# Patient Record
Sex: Female | Born: 1950 | Race: White | Hispanic: No | Marital: Single
Health system: Southern US, Community
[De-identification: ages and names within clinical notes are randomized; demographics above are authoritative.]

## PROBLEM LIST (undated history)

## (undated) DIAGNOSIS — D45 Polycythemia vera: Secondary | ICD-10-CM

## (undated) DIAGNOSIS — F32A Depression, unspecified: Secondary | ICD-10-CM

## (undated) DIAGNOSIS — I10 Essential (primary) hypertension: Secondary | ICD-10-CM

## (undated) DIAGNOSIS — F329 Major depressive disorder, single episode, unspecified: Secondary | ICD-10-CM

## (undated) DIAGNOSIS — K5190 Ulcerative colitis, unspecified, without complications: Secondary | ICD-10-CM

## (undated) DIAGNOSIS — Z91148 Patient's other noncompliance with medication regimen for other reason: Secondary | ICD-10-CM

## (undated) DIAGNOSIS — I2510 Atherosclerotic heart disease of native coronary artery without angina pectoris: Secondary | ICD-10-CM

## (undated) DIAGNOSIS — J449 Chronic obstructive pulmonary disease, unspecified: Secondary | ICD-10-CM

## (undated) DIAGNOSIS — Z9889 Other specified postprocedural states: Secondary | ICD-10-CM

## (undated) DIAGNOSIS — M8580 Other specified disorders of bone density and structure, unspecified site: Secondary | ICD-10-CM

## (undated) DIAGNOSIS — B1920 Unspecified viral hepatitis C without hepatic coma: Secondary | ICD-10-CM

## (undated) DIAGNOSIS — E785 Hyperlipidemia, unspecified: Secondary | ICD-10-CM

## (undated) DIAGNOSIS — K219 Gastro-esophageal reflux disease without esophagitis: Secondary | ICD-10-CM

## (undated) DIAGNOSIS — Z9114 Patient's other noncompliance with medication regimen: Secondary | ICD-10-CM

## (undated) DIAGNOSIS — M1990 Unspecified osteoarthritis, unspecified site: Secondary | ICD-10-CM

## (undated) DIAGNOSIS — F419 Anxiety disorder, unspecified: Secondary | ICD-10-CM

## (undated) DIAGNOSIS — I779 Disorder of arteries and arterioles, unspecified: Secondary | ICD-10-CM

## (undated) DIAGNOSIS — I509 Heart failure, unspecified: Secondary | ICD-10-CM

## (undated) DIAGNOSIS — H269 Unspecified cataract: Secondary | ICD-10-CM

## (undated) DIAGNOSIS — G459 Transient cerebral ischemic attack, unspecified: Secondary | ICD-10-CM

## (undated) DIAGNOSIS — I739 Peripheral vascular disease, unspecified: Secondary | ICD-10-CM

## (undated) DIAGNOSIS — R0600 Dyspnea, unspecified: Secondary | ICD-10-CM

## (undated) DIAGNOSIS — D751 Secondary polycythemia: Secondary | ICD-10-CM

## (undated) DIAGNOSIS — R569 Unspecified convulsions: Secondary | ICD-10-CM

## (undated) DIAGNOSIS — R7303 Prediabetes: Secondary | ICD-10-CM

## (undated) HISTORY — DX: Patient's other noncompliance with medication regimen for other reason: Z91.148

## (undated) HISTORY — DX: Unspecified osteoarthritis, unspecified site: M19.90

## (undated) HISTORY — PX: ABDOMINAL SURGERY: SHX537

## (undated) HISTORY — DX: Transient cerebral ischemic attack, unspecified: G45.9

## (undated) HISTORY — DX: Essential (primary) hypertension: I10

## (undated) HISTORY — DX: Ulcerative colitis, unspecified, without complications: K51.90

## (undated) HISTORY — DX: Patient's other noncompliance with medication regimen: Z91.14

## (undated) HISTORY — DX: Hyperlipidemia, unspecified: E78.5

## (undated) HISTORY — DX: Unspecified viral hepatitis C without hepatic coma: B19.20

## (undated) HISTORY — DX: Gastro-esophageal reflux disease without esophagitis: K21.9

## (undated) HISTORY — DX: Disorder of arteries and arterioles, unspecified: I77.9

## (undated) HISTORY — DX: Atherosclerotic heart disease of native coronary artery without angina pectoris: I25.10

## (undated) HISTORY — DX: Secondary polycythemia: D75.1

## (undated) HISTORY — PX: TUBAL LIGATION: SHX77

## (undated) HISTORY — PX: LYMPH NODE DISSECTION: SHX5087

## (undated) HISTORY — DX: Unspecified cataract: H26.9

## (undated) HISTORY — DX: Other specified disorders of bone density and structure, unspecified site: M85.80

---
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
## 1998-10-15 ENCOUNTER — Emergency Department (HOSPITAL_COMMUNITY): Admission: EM | Admit: 1998-10-15 | Discharge: 1998-10-15 | Payer: Self-pay | Admitting: Emergency Medicine

## 1998-10-16 ENCOUNTER — Encounter: Payer: Self-pay | Admitting: Emergency Medicine

## 1999-09-07 ENCOUNTER — Encounter: Payer: Self-pay | Admitting: Gastroenterology

## 1999-09-07 ENCOUNTER — Encounter: Admission: RE | Admit: 1999-09-07 | Discharge: 1999-09-07 | Payer: Self-pay | Admitting: Gastroenterology

## 1999-09-12 ENCOUNTER — Ambulatory Visit (HOSPITAL_COMMUNITY): Admission: RE | Admit: 1999-09-12 | Discharge: 1999-09-12 | Payer: Self-pay | Admitting: Gastroenterology

## 1999-09-12 ENCOUNTER — Encounter (INDEPENDENT_AMBULATORY_CARE_PROVIDER_SITE_OTHER): Payer: Self-pay

## 2000-04-26 ENCOUNTER — Encounter: Admission: RE | Admit: 2000-04-26 | Discharge: 2000-04-26 | Payer: Self-pay | Admitting: Obstetrics and Gynecology

## 2000-04-26 ENCOUNTER — Encounter: Payer: Self-pay | Admitting: Obstetrics and Gynecology

## 2000-05-02 ENCOUNTER — Other Ambulatory Visit: Admission: RE | Admit: 2000-05-02 | Discharge: 2000-05-02 | Payer: Self-pay | Admitting: Obstetrics and Gynecology

## 2000-11-01 ENCOUNTER — Encounter (INDEPENDENT_AMBULATORY_CARE_PROVIDER_SITE_OTHER): Payer: Self-pay | Admitting: *Deleted

## 2000-11-01 ENCOUNTER — Ambulatory Visit (HOSPITAL_COMMUNITY): Admission: RE | Admit: 2000-11-01 | Discharge: 2000-11-01 | Payer: Self-pay | Admitting: Obstetrics and Gynecology

## 2001-05-21 ENCOUNTER — Encounter: Payer: Self-pay | Admitting: Obstetrics and Gynecology

## 2001-05-21 ENCOUNTER — Encounter: Admission: RE | Admit: 2001-05-21 | Discharge: 2001-05-21 | Payer: Self-pay | Admitting: Obstetrics and Gynecology

## 2001-06-18 ENCOUNTER — Other Ambulatory Visit: Admission: RE | Admit: 2001-06-18 | Discharge: 2001-06-18 | Payer: Self-pay | Admitting: Obstetrics and Gynecology

## 2002-08-20 ENCOUNTER — Other Ambulatory Visit: Admission: RE | Admit: 2002-08-20 | Discharge: 2002-08-20 | Payer: Self-pay | Admitting: Obstetrics and Gynecology

## 2002-12-11 ENCOUNTER — Encounter: Payer: Self-pay | Admitting: Obstetrics and Gynecology

## 2002-12-11 ENCOUNTER — Encounter: Admission: RE | Admit: 2002-12-11 | Discharge: 2002-12-11 | Payer: Self-pay | Admitting: Obstetrics and Gynecology

## 2003-11-09 ENCOUNTER — Emergency Department (HOSPITAL_COMMUNITY): Admission: EM | Admit: 2003-11-09 | Discharge: 2003-11-09 | Payer: Self-pay | Admitting: Emergency Medicine

## 2004-03-09 ENCOUNTER — Ambulatory Visit: Payer: Self-pay | Admitting: Family Medicine

## 2004-03-17 ENCOUNTER — Ambulatory Visit (HOSPITAL_COMMUNITY): Admission: RE | Admit: 2004-03-17 | Discharge: 2004-03-17 | Payer: Self-pay | Admitting: Obstetrics and Gynecology

## 2004-04-08 ENCOUNTER — Ambulatory Visit: Payer: Self-pay | Admitting: Family Medicine

## 2004-04-12 ENCOUNTER — Encounter (INDEPENDENT_AMBULATORY_CARE_PROVIDER_SITE_OTHER): Payer: Self-pay | Admitting: Specialist

## 2004-04-12 ENCOUNTER — Other Ambulatory Visit: Admission: RE | Admit: 2004-04-12 | Discharge: 2004-04-12 | Payer: Self-pay

## 2004-04-12 ENCOUNTER — Encounter: Admission: RE | Admit: 2004-04-12 | Discharge: 2004-04-12 | Payer: Self-pay | Admitting: Obstetrics and Gynecology

## 2004-04-14 ENCOUNTER — Ambulatory Visit: Payer: Self-pay | Admitting: Family Medicine

## 2005-02-20 HISTORY — PX: OTHER SURGICAL HISTORY: SHX169

## 2005-02-20 HISTORY — PX: CAROTID ENDARTERECTOMY: SUR193

## 2005-08-20 ENCOUNTER — Encounter: Payer: Self-pay | Admitting: Family Medicine

## 2005-08-30 ENCOUNTER — Ambulatory Visit: Payer: Self-pay | Admitting: Family Medicine

## 2005-09-06 ENCOUNTER — Ambulatory Visit: Payer: Self-pay | Admitting: Family Medicine

## 2005-10-04 ENCOUNTER — Ambulatory Visit: Payer: Self-pay | Admitting: Family Medicine

## 2005-10-05 ENCOUNTER — Inpatient Hospital Stay (HOSPITAL_COMMUNITY): Admission: AD | Admit: 2005-10-05 | Discharge: 2005-10-09 | Payer: Self-pay | Admitting: *Deleted

## 2005-10-06 ENCOUNTER — Encounter (INDEPENDENT_AMBULATORY_CARE_PROVIDER_SITE_OTHER): Payer: Self-pay | Admitting: *Deleted

## 2005-12-07 ENCOUNTER — Ambulatory Visit: Payer: Self-pay | Admitting: Family Medicine

## 2006-01-24 ENCOUNTER — Ambulatory Visit: Payer: Self-pay | Admitting: Internal Medicine

## 2006-04-19 ENCOUNTER — Ambulatory Visit: Payer: Self-pay | Admitting: *Deleted

## 2006-06-03 ENCOUNTER — Encounter: Admission: RE | Admit: 2006-06-03 | Discharge: 2006-06-03 | Payer: Self-pay | Admitting: Cardiology

## 2006-06-07 ENCOUNTER — Ambulatory Visit: Payer: Self-pay | Admitting: Gastroenterology

## 2006-10-15 ENCOUNTER — Encounter: Admission: RE | Admit: 2006-10-15 | Discharge: 2006-10-15 | Payer: Self-pay | Admitting: Obstetrics and Gynecology

## 2006-10-23 ENCOUNTER — Encounter: Payer: Self-pay | Admitting: Family Medicine

## 2006-10-23 DIAGNOSIS — I10 Essential (primary) hypertension: Secondary | ICD-10-CM | POA: Insufficient documentation

## 2006-10-23 DIAGNOSIS — B1710 Acute hepatitis C without hepatic coma: Secondary | ICD-10-CM | POA: Insufficient documentation

## 2006-11-14 ENCOUNTER — Ambulatory Visit: Payer: Self-pay | Admitting: Family Medicine

## 2006-11-14 LAB — CONVERTED CEMR LAB
Ketones, urine, test strip: NEGATIVE
Nitrite: NEGATIVE
Protein, U semiquant: NEGATIVE
Specific Gravity, Urine: 1.025
Urobilinogen, UA: 0.2
pH: 5.5

## 2006-11-21 ENCOUNTER — Ambulatory Visit: Payer: Self-pay | Admitting: Family Medicine

## 2006-11-21 DIAGNOSIS — M159 Polyosteoarthritis, unspecified: Secondary | ICD-10-CM | POA: Insufficient documentation

## 2006-11-21 LAB — CONVERTED CEMR LAB
AST: 62 units/L — ABNORMAL HIGH (ref 0–37)
Albumin: 3.8 g/dL (ref 3.5–5.2)
Alkaline Phosphatase: 121 units/L — ABNORMAL HIGH (ref 39–117)
BUN: 11 mg/dL (ref 6–23)
CO2: 33 meq/L — ABNORMAL HIGH (ref 19–32)
Calcium: 9.9 mg/dL (ref 8.4–10.5)
Chloride: 108 meq/L (ref 96–112)
Cholesterol: 161 mg/dL (ref 0–200)
Creatinine, Ser: 0.9 mg/dL (ref 0.4–1.2)
GFR calc Af Amer: 83 mL/min
GFR calc non Af Amer: 69 mL/min
HCT: 50 % — ABNORMAL HIGH (ref 36.0–46.0)
HDL: 38.3 mg/dL — ABNORMAL LOW (ref >39.0)
MCHC: 34.2 g/dL (ref 30.0–36.0)
Monocytes Relative: 10.6 % (ref 3.0–11.0)
Neutrophils Relative %: 49 % (ref 43.0–77.0)
Platelets: 219 10*3/uL (ref 150–400)
Potassium: 4.1 meq/L (ref 3.5–5.1)
RBC: 5.29 M/uL — ABNORMAL HIGH (ref 3.87–5.11)
RDW: 11.8 % (ref 11.5–14.6)
Total CHOL/HDL Ratio: 4.2
Triglycerides: 131 mg/dL (ref 0–149)
WBC: 7.2 10*3/uL (ref 4.5–10.5)

## 2007-01-24 ENCOUNTER — Encounter: Payer: Self-pay | Admitting: Family Medicine

## 2007-01-24 DIAGNOSIS — F341 Dysthymic disorder: Secondary | ICD-10-CM | POA: Insufficient documentation

## 2007-02-11 ENCOUNTER — Ambulatory Visit: Payer: Self-pay | Admitting: Vascular Surgery

## 2007-02-12 ENCOUNTER — Telehealth: Payer: Self-pay | Admitting: Family Medicine

## 2007-02-21 DIAGNOSIS — D751 Secondary polycythemia: Secondary | ICD-10-CM

## 2007-02-21 HISTORY — DX: Secondary polycythemia: D75.1

## 2007-04-17 ENCOUNTER — Telehealth: Payer: Self-pay | Admitting: Family Medicine

## 2007-06-13 ENCOUNTER — Ambulatory Visit: Payer: Self-pay | Admitting: Family Medicine

## 2007-06-13 DIAGNOSIS — K2921 Alcoholic gastritis with bleeding: Secondary | ICD-10-CM | POA: Insufficient documentation

## 2007-06-13 DIAGNOSIS — K2901 Acute gastritis with bleeding: Secondary | ICD-10-CM | POA: Insufficient documentation

## 2007-06-14 LAB — CONVERTED CEMR LAB
Albumin: 4 g/dL (ref 3.5–5.2)
Basophils Relative: 0.1 % (ref 0.0–1.0)
Bilirubin, Direct: 0.3 mg/dL (ref 0.0–0.3)
Eosinophils Absolute: 0.1 10*3/uL (ref 0.0–0.7)
Eosinophils Relative: 1.1 % (ref 0.0–5.0)
MCV: 93.9 fL (ref 78.0–100.0)
Monocytes Absolute: 0.6 10*3/uL (ref 0.1–1.0)
Monocytes Relative: 8.3 % (ref 3.0–12.0)
Neutro Abs: 3.9 10*3/uL (ref 1.4–7.7)
Neutrophils Relative %: 54.7 % (ref 43.0–77.0)
Platelets: 190 10*3/uL (ref 150–400)
WBC: 7.2 10*3/uL (ref 4.5–10.5)

## 2007-06-20 ENCOUNTER — Telehealth: Payer: Self-pay | Admitting: Family Medicine

## 2007-06-21 ENCOUNTER — Encounter: Payer: Self-pay | Admitting: Family Medicine

## 2007-11-05 ENCOUNTER — Ambulatory Visit: Payer: Self-pay | Admitting: Gastroenterology

## 2007-11-20 ENCOUNTER — Telehealth: Payer: Self-pay | Admitting: Family Medicine

## 2007-11-21 ENCOUNTER — Ambulatory Visit: Payer: Self-pay | Admitting: Hematology & Oncology

## 2007-11-29 LAB — CBC WITH DIFFERENTIAL (CANCER CENTER ONLY)
BASO%: 3.4 % — ABNORMAL HIGH (ref 0.0–2.0)
EOS%: 1.5 % (ref 0.0–7.0)
LYMPH%: 30.8 % (ref 14.0–48.0)
MCH: 30.9 pg (ref 26.0–34.0)
MCHC: 33.9 g/dL (ref 32.0–36.0)
MCV: 91 fL (ref 81–101)
MONO#: 0.6 10*3/uL (ref 0.1–0.9)
MONO%: 7.4 % (ref 0.0–13.0)
NEUT%: 56.9 % (ref 39.6–80.0)
Platelets: 223 10*3/uL (ref 145–400)
RDW: 11.4 % (ref 10.5–14.6)
WBC: 8 10*3/uL (ref 3.9–10.0)

## 2007-11-29 LAB — CHCC SATELLITE - SMEAR

## 2007-12-02 LAB — FERRITIN: Ferritin: 123 ng/mL (ref 10–291)

## 2007-12-02 LAB — AFP TUMOR MARKER: AFP-Tumor Marker: 20.8 ng/mL — ABNORMAL HIGH (ref 0.0–8.0)

## 2007-12-02 LAB — VITAMIN B12: Vitamin B-12: 933 pg/mL — ABNORMAL HIGH (ref 211–911)

## 2007-12-02 LAB — RETICULOCYTES (CHCC): ABS Retic: 40.7 10*3/uL (ref 19.0–186.0)

## 2007-12-02 LAB — LACTATE DEHYDROGENASE: LDH: 195 U/L (ref 94–250)

## 2007-12-02 LAB — ERYTHROPOIETIN: Erythropoietin: 5.3 m[IU]/mL (ref 2.6–34.0)

## 2007-12-03 ENCOUNTER — Ambulatory Visit (HOSPITAL_COMMUNITY): Admission: RE | Admit: 2007-12-03 | Discharge: 2007-12-03 | Payer: Self-pay | Admitting: Hematology & Oncology

## 2007-12-10 LAB — CBC WITH DIFFERENTIAL (CANCER CENTER ONLY)
BASO%: 1.2 % (ref 0.0–2.0)
EOS%: 1.7 % (ref 0.0–7.0)
LYMPH#: 2.3 10*3/uL (ref 0.9–3.3)
MCHC: 34 g/dL (ref 32.0–36.0)
MONO#: 0.6 10*3/uL (ref 0.1–0.9)
NEUT#: 4.9 10*3/uL (ref 1.5–6.5)
Platelets: 195 10*3/uL (ref 145–400)
RDW: 11.1 % (ref 10.5–14.6)
WBC: 8 10*3/uL (ref 3.9–10.0)

## 2007-12-10 LAB — CHCC SATELLITE - SMEAR

## 2007-12-19 ENCOUNTER — Ambulatory Visit: Payer: Self-pay | Admitting: Hematology & Oncology

## 2007-12-27 LAB — CBC WITH DIFFERENTIAL (CANCER CENTER ONLY)
BASO#: 0.1 10*3/uL (ref 0.0–0.2)
Eosinophils Absolute: 0.1 10*3/uL (ref 0.0–0.5)
LYMPH%: 37.8 % (ref 14.0–48.0)
MCH: 31.5 pg (ref 26.0–34.0)
MCHC: 33.7 g/dL (ref 32.0–36.0)
MCV: 94 fL (ref 81–101)
MONO%: 6.2 % (ref 0.0–13.0)
Platelets: 233 10*3/uL (ref 145–400)
RBC: 4.54 10*6/uL (ref 3.70–5.32)

## 2008-01-07 ENCOUNTER — Ambulatory Visit: Payer: Self-pay | Admitting: Hematology & Oncology

## 2008-01-08 ENCOUNTER — Encounter: Payer: Self-pay | Admitting: Family Medicine

## 2008-01-08 LAB — CBC WITH DIFFERENTIAL (CANCER CENTER ONLY)
BASO%: 1.2 % (ref 0.0–2.0)
LYMPH%: 32.8 % (ref 14.0–48.0)
MCH: 31.9 pg (ref 26.0–34.0)
MCV: 94 fL (ref 81–101)
MONO%: 6.7 % (ref 0.0–13.0)
Platelets: 236 10*3/uL (ref 145–400)
RDW: 10.5 % (ref 10.5–14.6)
WBC: 8.2 10*3/uL (ref 3.9–10.0)

## 2008-01-23 ENCOUNTER — Encounter: Payer: Self-pay | Admitting: Family Medicine

## 2008-01-23 LAB — CBC WITH DIFFERENTIAL (CANCER CENTER ONLY)
BASO#: 0 10*3/uL (ref 0.0–0.2)
EOS%: 1.5 % (ref 0.0–7.0)
Eosinophils Absolute: 0.1 10*3/uL (ref 0.0–0.5)
HCT: 43.8 % (ref 34.8–46.6)
HGB: 14.9 g/dL (ref 11.6–15.9)
LYMPH#: 2.2 10*3/uL (ref 0.9–3.3)
MONO#: 0.5 10*3/uL (ref 0.1–0.9)
NEUT#: 3.5 10*3/uL (ref 1.5–6.5)
NEUT%: 55.5 % (ref 39.6–80.0)
RBC: 4.62 10*6/uL (ref 3.70–5.32)

## 2008-01-24 ENCOUNTER — Encounter: Admission: RE | Admit: 2008-01-24 | Discharge: 2008-01-24 | Payer: Self-pay | Admitting: Obstetrics and Gynecology

## 2008-02-21 HISTORY — PX: CORONARY ANGIOPLASTY WITH STENT PLACEMENT: SHX49

## 2008-02-26 ENCOUNTER — Ambulatory Visit: Payer: Self-pay | Admitting: Hematology & Oncology

## 2008-02-27 ENCOUNTER — Encounter: Payer: Self-pay | Admitting: Family Medicine

## 2008-02-27 LAB — CBC WITH DIFFERENTIAL (CANCER CENTER ONLY)
BASO%: 2.4 % — ABNORMAL HIGH (ref 0.0–2.0)
EOS%: 1.3 % (ref 0.0–7.0)
HGB: 16.5 g/dL — ABNORMAL HIGH (ref 11.6–15.9)
LYMPH#: 2.8 10*3/uL (ref 0.9–3.3)
MCHC: 34.1 g/dL (ref 32.0–36.0)
MONO#: 0.5 10*3/uL (ref 0.1–0.9)
NEUT#: 4.4 10*3/uL (ref 1.5–6.5)
RDW: 10.3 % — ABNORMAL LOW (ref 10.5–14.6)
WBC: 8 10*3/uL (ref 3.9–10.0)

## 2008-03-13 ENCOUNTER — Encounter: Payer: Self-pay | Admitting: Family Medicine

## 2008-03-13 LAB — CBC WITH DIFFERENTIAL (CANCER CENTER ONLY)
BASO#: 0.1 10*3/uL (ref 0.0–0.2)
Eosinophils Absolute: 0.1 10*3/uL (ref 0.0–0.5)
HCT: 48.7 % — ABNORMAL HIGH (ref 34.8–46.6)
HGB: 16.4 g/dL — ABNORMAL HIGH (ref 11.6–15.9)
MCH: 31.5 pg (ref 26.0–34.0)
MCV: 93 fL (ref 81–101)
MONO%: 8.2 % (ref 0.0–13.0)
NEUT#: 3.1 10*3/uL (ref 1.5–6.5)
NEUT%: 50.1 % (ref 39.6–80.0)
RBC: 5.22 10*6/uL (ref 3.70–5.32)

## 2008-03-22 ENCOUNTER — Emergency Department (HOSPITAL_BASED_OUTPATIENT_CLINIC_OR_DEPARTMENT_OTHER): Admission: EM | Admit: 2008-03-22 | Discharge: 2008-03-22 | Payer: Self-pay | Admitting: Emergency Medicine

## 2008-03-22 ENCOUNTER — Ambulatory Visit: Payer: Self-pay | Admitting: Diagnostic Radiology

## 2008-03-26 ENCOUNTER — Ambulatory Visit: Payer: Self-pay | Admitting: Family Medicine

## 2008-03-26 DIAGNOSIS — S139XXA Sprain of joints and ligaments of unspecified parts of neck, initial encounter: Secondary | ICD-10-CM | POA: Insufficient documentation

## 2008-04-10 ENCOUNTER — Encounter: Payer: Self-pay | Admitting: Family Medicine

## 2008-04-10 LAB — CBC WITH DIFFERENTIAL (CANCER CENTER ONLY)
BASO%: 0.5 % (ref 0.0–2.0)
EOS%: 1 % (ref 0.0–7.0)
HCT: 51.2 % — ABNORMAL HIGH (ref 34.8–46.6)
LYMPH#: 2.7 10*3/uL (ref 0.9–3.3)
LYMPH%: 31.1 % (ref 14.0–48.0)
MCHC: 34.1 g/dL (ref 32.0–36.0)
MONO#: 0.7 10*3/uL (ref 0.1–0.9)
NEUT%: 58.8 % (ref 39.6–80.0)
Platelets: 212 10*3/uL (ref 145–400)
RDW: 11.6 % (ref 10.5–14.6)
WBC: 8.7 10*3/uL (ref 3.9–10.0)

## 2008-04-10 LAB — FERRITIN: Ferritin: 42 ng/mL (ref 10–291)

## 2008-04-14 ENCOUNTER — Ambulatory Visit: Payer: Self-pay | Admitting: Hematology & Oncology

## 2008-04-24 ENCOUNTER — Ambulatory Visit: Payer: Self-pay | Admitting: Hematology & Oncology

## 2008-04-27 LAB — CBC WITH DIFFERENTIAL (CANCER CENTER ONLY)
BASO%: 0.8 % (ref 0.0–2.0)
EOS%: 1.7 % (ref 0.0–7.0)
HCT: 50 % — ABNORMAL HIGH (ref 34.8–46.6)
LYMPH#: 2.2 10*3/uL (ref 0.9–3.3)
MCHC: 32.8 g/dL (ref 32.0–36.0)
MONO#: 0.5 10*3/uL (ref 0.1–0.9)
NEUT#: 5.1 10*3/uL (ref 1.5–6.5)
NEUT%: 63.4 % (ref 39.6–80.0)
RDW: 10.4 % — ABNORMAL LOW (ref 10.5–14.6)
WBC: 8.1 10*3/uL (ref 3.9–10.0)

## 2008-05-11 ENCOUNTER — Encounter: Payer: Self-pay | Admitting: Family Medicine

## 2008-05-11 LAB — CBC WITH DIFFERENTIAL (CANCER CENTER ONLY)
BASO#: 0.1 10*3/uL (ref 0.0–0.2)
EOS%: 2.2 % (ref 0.0–7.0)
Eosinophils Absolute: 0.1 10*3/uL (ref 0.0–0.5)
HCT: 43.5 % (ref 34.8–46.6)
HGB: 14.3 g/dL (ref 11.6–15.9)
LYMPH#: 2.3 10*3/uL (ref 0.9–3.3)
MCHC: 32.9 g/dL (ref 32.0–36.0)
MONO#: 0.5 10*3/uL (ref 0.1–0.9)
NEUT#: 3.4 10*3/uL (ref 1.5–6.5)
NEUT%: 54 % (ref 39.6–80.0)
RBC: 4.69 10*6/uL (ref 3.70–5.32)
WBC: 6.3 10*3/uL (ref 3.9–10.0)

## 2008-05-19 ENCOUNTER — Ambulatory Visit: Payer: Self-pay | Admitting: Family Medicine

## 2008-05-19 DIAGNOSIS — J209 Acute bronchitis, unspecified: Secondary | ICD-10-CM | POA: Insufficient documentation

## 2008-05-25 LAB — CBC WITH DIFFERENTIAL (CANCER CENTER ONLY)
BASO%: 0.9 % (ref 0.0–2.0)
LYMPH%: 33.9 % (ref 14.0–48.0)
MCH: 30.1 pg (ref 26.0–34.0)
MCHC: 32.7 g/dL (ref 32.0–36.0)
MCV: 92 fL (ref 81–101)
MONO%: 7.6 % (ref 0.0–13.0)
NEUT%: 55.8 % (ref 39.6–80.0)
Platelets: 252 10*3/uL (ref 145–400)
RDW: 10.5 % (ref 10.5–14.6)

## 2008-06-04 ENCOUNTER — Ambulatory Visit: Payer: Self-pay | Admitting: Family Medicine

## 2008-06-04 DIAGNOSIS — K219 Gastro-esophageal reflux disease without esophagitis: Secondary | ICD-10-CM | POA: Insufficient documentation

## 2008-06-04 DIAGNOSIS — K5289 Other specified noninfective gastroenteritis and colitis: Secondary | ICD-10-CM | POA: Insufficient documentation

## 2008-06-08 LAB — CBC WITH DIFFERENTIAL (CANCER CENTER ONLY)
BASO%: 0.8 % (ref 0.0–2.0)
EOS%: 1.9 % (ref 0.0–7.0)
LYMPH#: 1.5 10*3/uL (ref 0.9–3.3)
LYMPH%: 31.3 % (ref 14.0–48.0)
MCHC: 32.7 g/dL (ref 32.0–36.0)
MONO#: 0.4 10*3/uL (ref 0.1–0.9)
Platelets: 177 10*3/uL (ref 145–400)
RDW: 11 % (ref 10.5–14.6)
WBC: 4.9 10*3/uL (ref 3.9–10.0)

## 2008-06-09 ENCOUNTER — Ambulatory Visit: Payer: Self-pay | Admitting: Family Medicine

## 2008-06-09 LAB — CONVERTED CEMR LAB
AST: 58 units/L — ABNORMAL HIGH (ref 0–37)
Albumin: 3.4 g/dL — ABNORMAL LOW (ref 3.5–5.2)
Basophils Absolute: 0.1 10*3/uL (ref 0.0–0.1)
Bilirubin Urine: NEGATIVE
Bilirubin, Direct: 0.3 mg/dL (ref 0.0–0.3)
CO2: 28 meq/L (ref 19–32)
Calcium: 9 mg/dL (ref 8.4–10.5)
Cholesterol: 147 mg/dL (ref 0–200)
Creatinine, Ser: 0.7 mg/dL (ref 0.4–1.2)
Eosinophils Relative: 1.8 % (ref 0.0–5.0)
GFR calc non Af Amer: 91.42 mL/min (ref >60)
Glucose, Urine, Semiquant: NEGATIVE
LDL Cholesterol: 72 mg/dL (ref 0–99)
Monocytes Absolute: 0.5 10*3/uL (ref 0.1–1.0)
Neutro Abs: 3 10*3/uL (ref 1.4–7.7)
Neutrophils Relative %: 55.2 % (ref 43.0–77.0)
Nitrite: NEGATIVE
Platelets: 178 10*3/uL (ref 150.0–400.0)
Potassium: 4.1 meq/L (ref 3.5–5.1)
Protein, U semiquant: NEGATIVE
RDW: 11.5 % (ref 11.5–14.6)
Sodium: 147 meq/L — ABNORMAL HIGH (ref 135–145)
TSH: 3.55 microintl units/mL (ref 0.35–5.50)
Total Bilirubin: 0.9 mg/dL (ref 0.3–1.2)
Total Protein: 6.7 g/dL (ref 6.0–8.3)
pH: 7

## 2008-06-16 ENCOUNTER — Ambulatory Visit: Payer: Self-pay | Admitting: Family Medicine

## 2008-06-19 ENCOUNTER — Ambulatory Visit: Payer: Self-pay | Admitting: Hematology & Oncology

## 2008-07-06 LAB — CBC WITH DIFFERENTIAL (CANCER CENTER ONLY)
BASO#: 0.1 10*3/uL (ref 0.0–0.2)
Eosinophils Absolute: 0.1 10*3/uL (ref 0.0–0.5)
HGB: 15.9 g/dL (ref 11.6–15.9)
LYMPH#: 2.6 10*3/uL (ref 0.9–3.3)
MONO#: 0.5 10*3/uL (ref 0.1–0.9)
NEUT#: 3.4 10*3/uL (ref 1.5–6.5)
RBC: 5.39 10*6/uL — ABNORMAL HIGH (ref 3.70–5.32)

## 2008-07-06 LAB — FERRITIN: Ferritin: 16 ng/mL (ref 10–291)

## 2008-07-17 ENCOUNTER — Encounter: Payer: Self-pay | Admitting: Family Medicine

## 2008-07-17 LAB — CBC WITH DIFFERENTIAL (CANCER CENTER ONLY)
BASO#: 0 10*3/uL (ref 0.0–0.2)
Eosinophils Absolute: 0.1 10*3/uL (ref 0.0–0.5)
HCT: 47.4 % — ABNORMAL HIGH (ref 34.8–46.6)
HGB: 15.5 g/dL (ref 11.6–15.9)
LYMPH%: 38.9 % (ref 14.0–48.0)
MCH: 29 pg (ref 26.0–34.0)
MCV: 89 fL (ref 81–101)
MONO%: 7.9 % (ref 0.0–13.0)
NEUT%: 50.8 % (ref 39.6–80.0)
Platelets: 213 10*3/uL (ref 145–400)
RBC: 5.34 10*6/uL — ABNORMAL HIGH (ref 3.70–5.32)

## 2008-08-04 ENCOUNTER — Ambulatory Visit: Payer: Self-pay | Admitting: Hematology & Oncology

## 2008-08-05 LAB — CBC WITH DIFFERENTIAL (CANCER CENTER ONLY)
BASO%: 1.2 % (ref 0.0–2.0)
EOS%: 2.4 % (ref 0.0–7.0)
HGB: 16.3 g/dL — ABNORMAL HIGH (ref 11.6–15.9)
LYMPH#: 2.8 10*3/uL (ref 0.9–3.3)
MCHC: 32.9 g/dL (ref 32.0–36.0)
NEUT#: 3.8 10*3/uL (ref 1.5–6.5)
RDW: 11.8 % (ref 10.5–14.6)
WBC: 7.3 10*3/uL (ref 3.9–10.0)

## 2008-09-09 ENCOUNTER — Ambulatory Visit: Payer: Self-pay | Admitting: Hematology & Oncology

## 2008-09-11 ENCOUNTER — Encounter: Payer: Self-pay | Admitting: Family Medicine

## 2008-09-11 LAB — CBC WITH DIFFERENTIAL (CANCER CENTER ONLY)
BASO%: 1.6 % (ref 0.0–2.0)
EOS%: 1.5 % (ref 0.0–7.0)
HCT: 51.2 % — ABNORMAL HIGH (ref 34.8–46.6)
LYMPH#: 2.8 10*3/uL (ref 0.9–3.3)
MCHC: 33.4 g/dL (ref 32.0–36.0)
NEUT%: 58.8 % (ref 39.6–80.0)
Platelets: 202 10*3/uL (ref 145–400)
RDW: 13.9 % (ref 10.5–14.6)

## 2008-09-11 LAB — FERRITIN: Ferritin: 26 ng/mL (ref 10–291)

## 2008-09-15 ENCOUNTER — Ambulatory Visit: Payer: Self-pay | Admitting: Hematology

## 2008-10-09 ENCOUNTER — Ambulatory Visit: Payer: Self-pay | Admitting: Hematology & Oncology

## 2008-10-09 HISTORY — PX: CORONARY ANGIOPLASTY WITH STENT PLACEMENT: SHX49

## 2008-10-12 ENCOUNTER — Encounter: Payer: Self-pay | Admitting: Family Medicine

## 2008-10-12 LAB — CBC WITH DIFFERENTIAL (CANCER CENTER ONLY)
BASO%: 0.9 % (ref 0.0–2.0)
EOS%: 1.7 % (ref 0.0–7.0)
HCT: 45.6 % (ref 34.8–46.6)
LYMPH%: 36.6 % (ref 14.0–48.0)
MCH: 30.2 pg (ref 26.0–34.0)
MCHC: 33.6 g/dL (ref 32.0–36.0)
MCV: 90 fL (ref 81–101)
MONO#: 0.3 10*3/uL (ref 0.1–0.9)
MONO%: 5.2 % (ref 0.0–13.0)
NEUT%: 55.6 % (ref 39.6–80.0)
Platelets: 216 10*3/uL (ref 145–400)
RDW: 13.3 % (ref 10.5–14.6)

## 2008-10-12 LAB — FERRITIN: Ferritin: 47 ng/mL (ref 10–291)

## 2008-10-15 ENCOUNTER — Inpatient Hospital Stay (HOSPITAL_COMMUNITY): Admission: RE | Admit: 2008-10-15 | Discharge: 2008-10-16 | Payer: Self-pay | Admitting: Cardiology

## 2008-10-15 HISTORY — PX: OTHER SURGICAL HISTORY: SHX169

## 2008-10-16 ENCOUNTER — Encounter: Payer: Self-pay | Admitting: Cardiology

## 2008-10-16 ENCOUNTER — Ambulatory Visit: Payer: Self-pay | Admitting: Vascular Surgery

## 2008-11-03 ENCOUNTER — Telehealth: Payer: Self-pay | Admitting: Family Medicine

## 2008-11-13 ENCOUNTER — Ambulatory Visit: Payer: Self-pay | Admitting: Hematology & Oncology

## 2008-12-02 ENCOUNTER — Encounter: Payer: Self-pay | Admitting: Family Medicine

## 2008-12-02 LAB — CBC WITH DIFFERENTIAL (CANCER CENTER ONLY)
BASO#: 0.1 10*3/uL (ref 0.0–0.2)
Eosinophils Absolute: 0.1 10*3/uL (ref 0.0–0.5)
HCT: 43.7 % (ref 34.8–46.6)
LYMPH%: 38.6 % (ref 14.0–48.0)
MCH: 30.6 pg (ref 26.0–34.0)
MCV: 90 fL (ref 81–101)
MONO#: 0.5 10*3/uL (ref 0.1–0.9)
MONO%: 7.7 % (ref 0.0–13.0)
NEUT%: 50.7 % (ref 39.6–80.0)
RBC: 4.83 10*6/uL (ref 3.70–5.32)
WBC: 6.3 10*3/uL (ref 3.9–10.0)

## 2008-12-02 LAB — COMPREHENSIVE METABOLIC PANEL
Alkaline Phosphatase: 100 U/L (ref 39–117)
BUN: 12 mg/dL (ref 6–23)
CO2: 26 mEq/L (ref 19–32)
Creatinine, Ser: 0.83 mg/dL (ref 0.40–1.20)
Glucose, Bld: 116 mg/dL — ABNORMAL HIGH (ref 70–99)
Total Bilirubin: 0.6 mg/dL (ref 0.3–1.2)
Total Protein: 7.1 g/dL (ref 6.0–8.3)

## 2008-12-02 LAB — FERRITIN: Ferritin: 37 ng/mL (ref 10–291)

## 2008-12-23 ENCOUNTER — Telehealth: Payer: Self-pay | Admitting: Family Medicine

## 2009-01-06 ENCOUNTER — Ambulatory Visit: Payer: Self-pay | Admitting: Family Medicine

## 2009-01-06 DIAGNOSIS — B002 Herpesviral gingivostomatitis and pharyngotonsillitis: Secondary | ICD-10-CM | POA: Insufficient documentation

## 2009-01-06 DIAGNOSIS — D751 Secondary polycythemia: Secondary | ICD-10-CM | POA: Insufficient documentation

## 2009-01-06 DIAGNOSIS — E559 Vitamin D deficiency, unspecified: Secondary | ICD-10-CM | POA: Insufficient documentation

## 2009-01-06 DIAGNOSIS — S339XXA Sprain of unspecified parts of lumbar spine and pelvis, initial encounter: Secondary | ICD-10-CM | POA: Insufficient documentation

## 2009-01-06 DIAGNOSIS — S335XXA Sprain of ligaments of lumbar spine, initial encounter: Secondary | ICD-10-CM

## 2009-01-06 DIAGNOSIS — N760 Acute vaginitis: Secondary | ICD-10-CM | POA: Insufficient documentation

## 2009-01-06 DIAGNOSIS — N390 Urinary tract infection, site not specified: Secondary | ICD-10-CM | POA: Insufficient documentation

## 2009-01-06 LAB — CONVERTED CEMR LAB
Glucose, Urine, Semiquant: NEGATIVE
Nitrite: NEGATIVE

## 2009-01-19 ENCOUNTER — Ambulatory Visit: Payer: Self-pay | Admitting: Family Medicine

## 2009-01-19 ENCOUNTER — Ambulatory Visit: Payer: Self-pay | Admitting: Hematology & Oncology

## 2009-01-19 DIAGNOSIS — M940 Chondrocostal junction syndrome [Tietze]: Secondary | ICD-10-CM | POA: Insufficient documentation

## 2009-01-19 DIAGNOSIS — R079 Chest pain, unspecified: Secondary | ICD-10-CM | POA: Insufficient documentation

## 2009-01-19 DIAGNOSIS — I2510 Atherosclerotic heart disease of native coronary artery without angina pectoris: Secondary | ICD-10-CM

## 2009-01-19 LAB — CONVERTED CEMR LAB: Vit D, 25-Hydroxy: 56 ng/mL (ref 30–89)

## 2009-01-20 ENCOUNTER — Encounter: Payer: Self-pay | Admitting: Family Medicine

## 2009-01-20 LAB — CBC WITH DIFFERENTIAL (CANCER CENTER ONLY)
BASO#: 0.1 10*3/uL (ref 0.0–0.2)
Eosinophils Absolute: 0.1 10*3/uL (ref 0.0–0.5)
HGB: 15.5 g/dL (ref 11.6–15.9)
MCH: 30.6 pg (ref 26.0–34.0)
MCV: 87 fL (ref 81–101)
MONO#: 0.6 10*3/uL (ref 0.1–0.9)
MONO%: 6.5 % (ref 0.0–13.0)
NEUT#: 5.7 10*3/uL (ref 1.5–6.5)
Platelets: 239 10*3/uL (ref 145–400)
RBC: 5.07 10*6/uL (ref 3.70–5.32)
WBC: 9.4 10*3/uL (ref 3.9–10.0)

## 2009-02-26 ENCOUNTER — Ambulatory Visit: Payer: Self-pay | Admitting: Hematology & Oncology

## 2009-03-10 ENCOUNTER — Encounter: Payer: Self-pay | Admitting: Family Medicine

## 2009-03-10 LAB — CBC WITH DIFFERENTIAL (CANCER CENTER ONLY)
BASO#: 0 10*3/uL (ref 0.0–0.2)
HGB: 15.1 g/dL (ref 11.6–15.9)
LYMPH#: 2.2 10*3/uL (ref 0.9–3.3)
LYMPH%: 31.8 % (ref 14.0–48.0)
MONO#: 0.4 10*3/uL (ref 0.1–0.9)
NEUT#: 4.1 10*3/uL (ref 1.5–6.5)
NEUT%: 60.6 % (ref 39.6–80.0)

## 2009-03-17 LAB — COMPREHENSIVE METABOLIC PANEL
Alkaline Phosphatase: 115 U/L (ref 39–117)
BUN: 10 mg/dL (ref 6–23)
CO2: 28 mEq/L (ref 19–32)
Creatinine, Ser: 0.83 mg/dL (ref 0.40–1.20)
Glucose, Bld: 121 mg/dL — ABNORMAL HIGH (ref 70–99)
Sodium: 141 mEq/L (ref 135–145)
Total Bilirubin: 0.8 mg/dL (ref 0.3–1.2)

## 2009-03-17 LAB — FERRITIN: Ferritin: 93 ng/mL (ref 10–291)

## 2009-04-06 ENCOUNTER — Ambulatory Visit: Payer: Self-pay | Admitting: Hematology & Oncology

## 2009-04-12 LAB — CBC WITH DIFFERENTIAL (CANCER CENTER ONLY)
BASO#: 0.1 10*3/uL (ref 0.0–0.2)
Eosinophils Absolute: 0.1 10*3/uL (ref 0.0–0.5)
HGB: 14.9 g/dL (ref 11.6–15.9)
LYMPH%: 34.3 % (ref 14.0–48.0)
MCH: 30.4 pg (ref 26.0–34.0)
MCV: 91 fL (ref 81–101)
MONO#: 0.5 10*3/uL (ref 0.1–0.9)
MONO%: 7.8 % (ref 0.0–13.0)
NEUT#: 3.7 10*3/uL (ref 1.5–6.5)
RBC: 4.91 10*6/uL (ref 3.70–5.32)

## 2009-05-17 ENCOUNTER — Ambulatory Visit: Payer: Self-pay | Admitting: Hematology & Oncology

## 2009-05-19 ENCOUNTER — Encounter: Payer: Self-pay | Admitting: Family Medicine

## 2009-05-19 LAB — CBC WITH DIFFERENTIAL (CANCER CENTER ONLY)
BASO#: 0 10*3/uL (ref 0.0–0.2)
EOS%: 1.4 % (ref 0.0–7.0)
HCT: 44 % (ref 34.8–46.6)
HGB: 14.6 g/dL (ref 11.6–15.9)
LYMPH%: 38 % (ref 14.0–48.0)
MCH: 30.1 pg (ref 26.0–34.0)
MCHC: 33.1 g/dL (ref 32.0–36.0)
MONO%: 4.5 % (ref 0.0–13.0)
NEUT%: 55.5 % (ref 39.6–80.0)

## 2009-05-19 LAB — COMPREHENSIVE METABOLIC PANEL
AST: 42 U/L — ABNORMAL HIGH (ref 0–37)
Alkaline Phosphatase: 109 U/L (ref 39–117)
BUN: 14 mg/dL (ref 6–23)
Creatinine, Ser: 0.79 mg/dL (ref 0.40–1.20)

## 2009-07-06 ENCOUNTER — Ambulatory Visit: Payer: Self-pay | Admitting: Hematology & Oncology

## 2009-07-07 ENCOUNTER — Encounter: Payer: Self-pay | Admitting: Family Medicine

## 2009-07-07 LAB — RETICULOCYTES (CHCC)
ABS Retic: 35.5 10*3/uL (ref 19.0–186.0)
RBC.: 5.07 MIL/uL (ref 3.87–5.11)

## 2009-07-07 LAB — CBC WITH DIFFERENTIAL (CANCER CENTER ONLY)
BASO#: 0.1 10*3/uL (ref 0.0–0.2)
BASO%: 1 % (ref 0.0–2.0)
EOS%: 1.9 % (ref 0.0–7.0)
HGB: 15.2 g/dL (ref 11.6–15.9)
LYMPH#: 2.3 10*3/uL (ref 0.9–3.3)
MCH: 29.6 pg (ref 26.0–34.0)
MCHC: 33.7 g/dL (ref 32.0–36.0)
MONO%: 4.1 % (ref 0.0–13.0)
NEUT#: 4.2 10*3/uL (ref 1.5–6.5)
Platelets: 270 10*3/uL (ref 145–400)
RDW: 11.5 % (ref 10.5–14.6)

## 2009-07-07 LAB — COMPREHENSIVE METABOLIC PANEL
ALT: 33 U/L (ref 0–35)
AST: 45 U/L — ABNORMAL HIGH (ref 0–37)
Calcium: 9.8 mg/dL (ref 8.4–10.5)
Chloride: 102 mEq/L (ref 96–112)
Creatinine, Ser: 0.87 mg/dL (ref 0.40–1.20)

## 2009-07-09 ENCOUNTER — Emergency Department (HOSPITAL_COMMUNITY): Admission: EM | Admit: 2009-07-09 | Discharge: 2009-07-10 | Payer: Self-pay | Admitting: Emergency Medicine

## 2009-07-21 ENCOUNTER — Encounter: Payer: Self-pay | Admitting: Family Medicine

## 2009-07-27 ENCOUNTER — Telehealth: Payer: Self-pay | Admitting: Family Medicine

## 2009-08-31 ENCOUNTER — Ambulatory Visit: Payer: Self-pay | Admitting: Hematology & Oncology

## 2009-09-01 ENCOUNTER — Encounter: Payer: Self-pay | Admitting: Family Medicine

## 2009-09-01 LAB — COMPREHENSIVE METABOLIC PANEL
ALT: 39 U/L — ABNORMAL HIGH (ref 0–35)
AST: 52 U/L — ABNORMAL HIGH (ref 0–37)
Albumin: 4.1 g/dL (ref 3.5–5.2)
CO2: 27 mEq/L (ref 19–32)
Calcium: 9.5 mg/dL (ref 8.4–10.5)
Chloride: 101 mEq/L (ref 96–112)
Creatinine, Ser: 0.88 mg/dL (ref 0.40–1.20)
Potassium: 5.1 mEq/L (ref 3.5–5.3)
Total Protein: 7.2 g/dL (ref 6.0–8.3)

## 2009-09-01 LAB — CBC WITH DIFFERENTIAL (CANCER CENTER ONLY)
BASO#: 0 10*3/uL (ref 0.0–0.2)
EOS%: 2.4 % (ref 0.0–7.0)
Eosinophils Absolute: 0.1 10*3/uL (ref 0.0–0.5)
HCT: 45.6 % (ref 34.8–46.6)
HGB: 15.3 g/dL (ref 11.6–15.9)
LYMPH#: 2.2 10*3/uL (ref 0.9–3.3)
MCH: 29.3 pg (ref 26.0–34.0)
MCHC: 33.6 g/dL (ref 32.0–36.0)
NEUT#: 2.9 10*3/uL (ref 1.5–6.5)
NEUT%: 52 % (ref 39.6–80.0)
RBC: 5.23 10*6/uL (ref 3.70–5.32)

## 2009-09-01 LAB — RETICULOCYTES (CHCC)
ABS Retic: 46.4 10*3/uL (ref 19.0–186.0)
RBC.: 5.16 MIL/uL — ABNORMAL HIGH (ref 3.87–5.11)

## 2009-09-01 LAB — FERRITIN: Ferritin: 86 ng/mL (ref 10–291)

## 2009-10-07 ENCOUNTER — Ambulatory Visit: Payer: Self-pay | Admitting: Family Medicine

## 2009-10-07 DIAGNOSIS — D179 Benign lipomatous neoplasm, unspecified: Secondary | ICD-10-CM | POA: Insufficient documentation

## 2009-10-07 DIAGNOSIS — B029 Zoster without complications: Secondary | ICD-10-CM | POA: Insufficient documentation

## 2009-11-02 ENCOUNTER — Ambulatory Visit: Payer: Self-pay | Admitting: Hematology & Oncology

## 2009-11-03 ENCOUNTER — Encounter: Payer: Self-pay | Admitting: Family Medicine

## 2009-11-10 ENCOUNTER — Ambulatory Visit: Payer: Self-pay | Admitting: Oncology

## 2009-11-27 ENCOUNTER — Emergency Department (HOSPITAL_COMMUNITY): Admission: EM | Admit: 2009-11-27 | Discharge: 2009-11-27 | Payer: Self-pay | Admitting: Emergency Medicine

## 2009-11-27 ENCOUNTER — Ambulatory Visit: Payer: Self-pay | Admitting: Family Medicine

## 2009-11-29 ENCOUNTER — Telehealth: Payer: Self-pay | Admitting: Family Medicine

## 2009-12-06 ENCOUNTER — Encounter: Payer: Self-pay | Admitting: Family Medicine

## 2009-12-24 ENCOUNTER — Ambulatory Visit: Payer: Self-pay | Admitting: Hematology & Oncology

## 2009-12-29 ENCOUNTER — Encounter: Payer: Self-pay | Admitting: Family Medicine

## 2009-12-29 LAB — CBC WITH DIFFERENTIAL (CANCER CENTER ONLY)
Eosinophils Absolute: 0.1 10*3/uL (ref 0.0–0.5)
HCT: 43.9 % (ref 34.8–46.6)
LYMPH%: 32.4 % (ref 14.0–48.0)
MCH: 30.5 pg (ref 26.0–34.0)
MCV: 90 fL (ref 81–101)
MONO%: 5.9 % (ref 0.0–13.0)
NEUT%: 59.6 % (ref 39.6–80.0)
Platelets: 248 10*3/uL (ref 145–400)
RBC: 4.89 10*6/uL (ref 3.70–5.32)

## 2009-12-29 LAB — COMPREHENSIVE METABOLIC PANEL
Alkaline Phosphatase: 98 U/L (ref 39–117)
BUN: 14 mg/dL (ref 6–23)
CO2: 29 mEq/L (ref 19–32)
Creatinine, Ser: 0.88 mg/dL (ref 0.40–1.20)
Glucose, Bld: 120 mg/dL — ABNORMAL HIGH (ref 70–99)
Sodium: 139 mEq/L (ref 135–145)
Total Bilirubin: 0.6 mg/dL (ref 0.3–1.2)

## 2009-12-29 LAB — FERRITIN: Ferritin: 32 ng/mL (ref 10–291)

## 2010-02-26 ENCOUNTER — Ambulatory Visit
Admission: RE | Admit: 2010-02-26 | Discharge: 2010-02-26 | Payer: Self-pay | Source: Home / Self Care | Attending: Family Medicine | Admitting: Family Medicine

## 2010-02-26 DIAGNOSIS — J45901 Unspecified asthma with (acute) exacerbation: Secondary | ICD-10-CM | POA: Insufficient documentation

## 2010-03-08 ENCOUNTER — Ambulatory Visit: Payer: Self-pay | Admitting: Hematology & Oncology

## 2010-03-13 ENCOUNTER — Encounter: Payer: Self-pay | Admitting: Hematology & Oncology

## 2010-03-13 ENCOUNTER — Encounter: Payer: Self-pay | Admitting: Obstetrics and Gynecology

## 2010-03-20 LAB — CONVERTED CEMR LAB
Bilirubin Urine: NEGATIVE
Glucose, Urine, Semiquant: NEGATIVE
Nitrite: NEGATIVE
Urobilinogen, UA: 0.2
WBC Urine, dipstick: NEGATIVE

## 2010-03-21 LAB — CBC WITH DIFFERENTIAL (CANCER CENTER ONLY)
BASO%: 1.2 % (ref 0.0–2.0)
EOS%: 2.4 % (ref 0.0–7.0)
LYMPH%: 41.8 % (ref 14.0–48.0)
MCH: 29.3 pg (ref 26.0–34.0)
MCHC: 33.3 g/dL (ref 32.0–36.0)
MCV: 88 fL (ref 81–101)
MONO%: 8.3 % (ref 0.0–13.0)
NEUT%: 46.3 % (ref 39.6–80.0)
Platelets: 223 10*3/uL (ref 145–400)
RDW: 11.4 % (ref 10.5–14.6)
WBC: 6.1 10*3/uL (ref 3.9–10.0)

## 2010-03-21 LAB — FERRITIN: Ferritin: 43 ng/mL (ref 10–291)

## 2010-03-21 LAB — COMPREHENSIVE METABOLIC PANEL
ALT: 47 U/L — ABNORMAL HIGH (ref 0–35)
Alkaline Phosphatase: 108 U/L (ref 39–117)
Creatinine, Ser: 0.82 mg/dL (ref 0.40–1.20)
Sodium: 136 mEq/L (ref 135–145)
Total Bilirubin: 0.6 mg/dL (ref 0.3–1.2)
Total Protein: 6.5 g/dL (ref 6.0–8.3)

## 2010-03-22 NOTE — Progress Notes (Signed)
Summary: Call A Nurse  Phone Note Other Incoming       [facility]-A-Nurse Triage Call Report Triage Record Num: [id number] Operator: Di Kindle Patient Name: [name] Call Date & Time: 11/27/2009 10:36:09AM Patient Phone: [phone] PCP: Rickard Patience Patient Gender: Female PCP Fax : [phone] Patient DOB: 05/23/1950 Practice Name: [facility] Reason for Call: Emergent call, pt calling, using valtrex as recommended, WU:XLKG are swollen they have been hurting for a couple of days. Guideline: eye irritaiton, caller to office for appt in 4 hrs: [name]. Protocol(s) Used: Eye: Infection or Irritation Recommended Outcome per Protocol: See Provider within 4 hours Reason for Outcome: New onset of severe sensitivity to light or glare (photophobia), eye pain, reduced vision, tearing, or discharge. Care Advice:  ~ Another adult should drive if vision is impaired.  ~ Call provider if symptoms worsen or new symptoms develop. 10/  Needs to go to ER to have this evaluated if she is having severe pain in eye and photophobia      Pt did go to ER on Oct. 8th and the ER report is on MD's desk./ CF

## 2010-03-22 NOTE — Letter (Signed)
Summary: [facility]  [facility]   Imported By: Maryln Gottron 01/07/2010 14:49:16  _____________________________________________________________________  External Attachment:    Type:   Image     Comment:   External Document

## 2010-03-22 NOTE — Letter (Signed)
[facility]   Imported By: Maryln Gottron 04/28/2009 14:00:43  _____________________________________________________________________  External Attachment:    Type:   Image     Comment:   External Document

## 2010-03-22 NOTE — Letter (Signed)
[facility]   Imported By: Maryln Gottron 10/18/2009 15:56:38  _____________________________________________________________________  External Attachment:    Type:   Image     Comment:   External Document

## 2010-03-22 NOTE — Progress Notes (Signed)
Summary: rx alprazolam to [facility]  Phone Note From Pharmacy   Caller: [facility]  Reason for Call: Needs renewal Summary of Call: refill alprazolam  Initial call taken by: Pura Spice, RN,  July 27, 2009 12:38 PM  Follow-up for Phone Call        ok per dr Scotty Court  Follow-up by: Pura Spice, RN,  July 27, 2009 12:39 PM    New/Updated Medications: ALPRAZOLAM 0.5 MG  TABS (ALPRAZOLAM) Take 1 tablet by mouth three times a day Prescriptions: ALPRAZOLAM 0.5 MG  TABS (ALPRAZOLAM) Take 1 tablet by mouth three times a day  #270 x 0   Entered by:   Pura Spice, RN   Authorized by:   Judithann Sheen MD   Signed by:   Pura Spice, RN on 07/27/2009   Method used:   Printed then faxed to ...       [facility]* (mail-order)             ,          Ph: [phone]       Fax: [phone]   RxID:   [id number]

## 2010-03-22 NOTE — Assessment & Plan Note (Signed)
Summary: DIZZYNESS AND EYES SWOLLEN/DLO   Vital Signs:  Patient profile:   60 year old female Height:      61 inches Weight:      148.75 pounds O2 Sat:      98 % on Room air Temp:     97.1 degrees F oral Pulse rate:   65 / minute BP sitting:   136 / 80  (left arm) Cuff size:   regular  Vitals Entered By: Jarome Lamas (November 27, 2009 1:07 PM)  O2 Flow:  Room air CC: dizzy and eyes swollen/pb   History of Present Illness: eyelids started swelling early this week and not getting better   is having some positional dizziness - perhaps vertigo  used saline nasal rinse last night  room spins when she looks left or right  some allergy symptoms - stuffy an drip  had shingles 1 mo ago  prednisone and valtrex -- prednisone gave her insomnia  that cleared up (under her R arm)  a week ago - another outbreak under L breast and went back on the valtrex  took last one last night  that outbreak is gone   eyes itch and hurt also  had some lesions on face and also on tip of nose(gone now) pt is very worried she has shingles of eyes  eyes really itch and hurt    no numbness or weakness    Current Medications (verified): 1)  Alprazolam 0.5 Mg  Tabs (Alprazolam) .... Take 1 Tablet By Mouth Three Times A Day 2)  Metoprolol Succinate 100 Mg Xr24h-Tab (Metoprolol Succinate) .Marland Kitchen.. 1 By Mouth Once Daily 3)  Imodium Advanced 2-125 Mg Chew (Loperamide-Simethicone) .... 2 Onset Diarrhea, 1 After Each Loose Stool 4)  Aspir-Low 81 Mg Tbec (Aspirin) .Marland Kitchen.. 1 Once Daily 5)  Plavix 75 Mg Tabs (Clopidogrel Bisulfate) .... Per Dr Deborah Chalk 6)  Metrogel-Vaginal 0.75 % Gel (Metronidazole) .... Insert 1 Applicator For 7 Days 7)  Magic Mout Wash Hc .Marland Kitchen.. 1 Tsp in Mouth , Rinse Gargle and Swallow Qid 8)  Ciprofloxacin Hcl 500 Mg Tabs (Ciprofloxacin Hcl) .Marland Kitchen.. 1 Two Times A Day For Infection 9)  Lotrisone 1-0.05 % Crea (Clotrimazole-Betamethasone) .... Apply Bid 10)  Valtrex 1 Gm Tabs (Valacyclovir Hcl)  .Marland Kitchen.. 1 Three Times A Day For Shingles 11)  Prednisone 20 Mg Tabs (Prednisone) .Marland Kitchen.. 1 Three Times A Day Pc For 3 Days Then 1 Bidpc For 6 Daysthen 1 Each Day For Shingles and Arthritis  Allergies (verified): No Known Drug Allergies  Physical Exam  Eyes:  bilat upper lid swelling with tenderness  lat corners of conj are erythematous and tender   Skin:  no rash seen on face   faded lesions under R arm Psych:  pt is uncomfortable with eye pain and mildly anxious   Impression & Recommendations:  Problem # 1:  HERPES ZOSTER (ICD-053.9)  recent herpes zoster now with eye pain and headache / irritation  pt is very concerned about zoster of the eye  she was sent to ED for eval by opthymology [facility] called and alerted to her arrival no charge for visit today given direct ref to ER  Orders: No Charge Patient Arrived (NCPA0) (NCPA0)  Complete Medication List: 1)  Alprazolam 0.5 Mg Tabs (Alprazolam) .... Take 1 tablet by mouth three times a day 2)  Metoprolol Succinate 100 Mg Xr24h-tab (Metoprolol succinate) .Marland Kitchen.. 1 by mouth once daily 3)  Imodium Advanced 2-125 Mg Chew (Loperamide-simethicone) .... 2 onset diarrhea, 1  after each loose stool 4)  Aspir-low 81 Mg Tbec (Aspirin) .Marland Kitchen.. 1 once daily 5)  Plavix 75 Mg Tabs (Clopidogrel bisulfate) .... Per dr Deborah Chalk 6)  Metrogel-vaginal 0.75 % Gel (Metronidazole) .... Insert 1 applicator for 7 days 7)  Magic Mout Wash Hc  .Marland Kitchen.. 1 tsp in mouth , rinse gargle and swallow qid 8)  Ciprofloxacin Hcl 500 Mg Tabs (Ciprofloxacin hcl) .Marland Kitchen.. 1 two times a day for infection 9)  Lotrisone 1-0.05 % Crea (Clotrimazole-betamethasone) .... Apply bid 10)  Valtrex 1 Gm Tabs (Valacyclovir hcl) .Marland Kitchen.. 1 three times a day for shingles 11)  Prednisone 20 Mg Tabs (Prednisone) .Marland Kitchen.. 1 three times a day pc for 3 days then 1 bidpc for 6 daysthen 1 each day for shingles and arthritis  Patient Instructions: 1)  please go across the street to [facility] for evaluation 2)  I  cannot completely rule out shingles involving an eye so I feel you need evaluation today

## 2010-03-22 NOTE — Letter (Signed)
[facility]  [facility]   Imported By: Maryln Gottron 03/03/2009 15:49:51  _____________________________________________________________________  External Attachment:    Type:   Image     Comment:   External Document

## 2010-03-22 NOTE — Letter (Signed)
[facility]   Imported By: Maryln Gottron 04/03/2008 14:11:39  _____________________________________________________________________  External Attachment:    Type:   Image     Comment:   External Document

## 2010-03-22 NOTE — Letter (Signed)
Summary: [facility]  [facility]   Imported By: Maryln Gottron 11/18/2009 11:14:50  _____________________________________________________________________  External Attachment:    Type:   Image     Comment:   External Document

## 2010-03-22 NOTE — Letter (Signed)
[facility]   Imported By: Maryln Gottron 07/22/2009 14:12:30  _____________________________________________________________________  External Attachment:    Type:   Image     Comment:   External Document

## 2010-03-22 NOTE — Assessment & Plan Note (Signed)
Summary: lump on lft upper thigh/bumps under right arm/painful/cjr   Vital Signs:  Patient profile:   60 year old female Weight:      147 pounds BMI:     27.88 O2 Sat:      97 % Temp:     98.2 degrees F Pulse rate:   78 / minute Pulse rhythm:   regular BP sitting:   130 / 86  (left arm)  Vitals Entered By: Pura Spice, RN (October 07, 2009 10:48 AM) CC: ? fatty turmor l posterior leg. stated she had dr Myna Hidalgo to look at it and he advised her to see PCP . also bumps under rt axiallae which she states very painful    History of Present Illness: This 60 year old white single female with history of hepatitis C and to attend the clinic had [facility], is also in the care of Dr. Rosemarie Beath for polycythemia she felt a tumor on her left thigh which Dr. 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 Rudean Haskell carotid endarterectomy 2007  Social History: Last updated: 06/16/2008 Current Smoker  Risk Factors: Smoking Status: quit < 6 months (01/06/2009) Packs/Day: 1.0 (06/16/2008)  Review of Systems      See HPI  The patient denies anorexia, fever, weight loss, weight gain, vision loss, decreased hearing, hoarseness, chest pain, syncope, dyspnea on exertion, peripheral edema, prolonged cough, headaches, hemoptysis, abdominal pain, melena, hematochezia, severe indigestion/heartburn, hematuria, incontinence, genital sores, muscle weakness, suspicious skin lesions, transient blindness, difficulty walking, depression, unusual weight change, abnormal bleeding, enlarged lymph nodes, angioedema, breast masses, and  testicular masses.    Physical Exam  General:  Well-developed,well-nourished,in no acute distress; alert,appropriate and cooperative throughout examination Chest Wall:  fascicular rash on her right axilla and noted to Lungs:  Normal respiratory effort, chest expands symmetrically. Lungs are clear to auscultation, no crackles or wheezes. Heart:  Normal rate and regular rhythm. S1 and S2 normal without gallop, murmur, click, rub or other extra sounds. 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 for 7 days Prednisone 10 mg decrease in dosage  Problem # 3:  POLYCYTHEMIA (ICD-289.0) Assessment: Improved  Problem # 4:  HEPATITIS C (ICD-070.51) Assessment: Unchanged  Problem # 5:  HYPERTENSION (ICD-401.9) Assessment: Improved  Her updated medication list for this problem includes:    Metoprolol Succinate 100 Mg Xr24h-tab (Metoprolol succinate) .Marland Kitchen... 1 by mouth once daily  Complete Medication List: 1)  Alprazolam 0.5 Mg Tabs (Alprazolam) .... Take 1 tablet by mouth three times a day 2)  Metoprolol Succinate 100 Mg Xr24h-tab (Metoprolol succinate) .Marland Kitchen.. 1 by mouth once daily 3)  Imodium Advanced 2-125 Mg Chew (Loperamide-simethicone) .... 2 onset diarrhea, 1 after each loose stool 4)  Aspir-low  81 Mg Tbec (Aspirin) .Marland Kitchen.. 1 once daily 5)  Plavix 75 Mg Tabs (Clopidogrel bisulfate) .... Per dr Deborah Chalk 6)  Metrogel-vaginal 0.75 % Gel (Metronidazole) .... Insert 1 applicator for 7 days 7)  Magic Mout Wash Hc  .Marland Kitchen.. 1 tsp in mouth , rinse gargle and swallow qid 8)  Ciprofloxacin Hcl 500 Mg  Tabs (Ciprofloxacin hcl) .Marland Kitchen.. 1 two times a day for infection 9)  Lotrisone 1-0.05 % Crea (Clotrimazole-betamethasone) .... Apply bid 10)  Valtrex 1 Gm Tabs (Valacyclovir hcl) .Marland Kitchen.. 1 three times a day for shingles 11)  Prednisone 20 Mg Tabs (Prednisone) .Marland Kitchen.. 1 three times a day pc for 3 days then 1 bidpc for 6 daysthen 1 each day for shingles and arthritis  Patient Instructions: 1)  the rash in the right axilla and chest wall his herpes zoster 2)  Treatment Valtrex 1 g 3 times daily for 7 days 3)  Also decreasing dosage of prednisone 4)  Recommend pulling to the hepatitis C clinic at [facility] Prescriptions: PREDNISONE 20 MG TABS (PREDNISONE) 1 three times a day pc for 3 days then 1 bidpc for 6 daysthen 1 each day for shingles and arthritis  #30 x 1   Entered and Authorized by:   Judithann Sheen MD   Signed by:   Judithann Sheen MD on 10/07/2009   Method used:   Electronically to        [id number]* (retail)       [address]       [place], [place]  [address]       Ph: [phone] or [phone]       Fax: [phone]   RxID:   [id number] VALTREX 1 GM TABS (VALACYCLOVIR HCL) 1 three times a day for shingles  #21 x 1   Entered and Authorized by:   Judithann Sheen MD   Signed by:   Judithann Sheen MD on 10/07/2009   Method used:   Electronically to        [id number]* (retail)       [address]       [place], [place]  [address]       Ph: [phone] or [phone]       Fax: [phone]   RxID:   [id number] MAGIC MOUT WASH HC 1 tsp in mouth , rinse gargle and swallow qid  #240 cc x 3   Entered and Authorized by:   Judithann Sheen MD   Signed by:   Judithann Sheen MD on 10/07/2009   Method used:   Print then Give to Patient   RxID:   [id number] LOTRISONE 1-0.05 % CREA (CLOTRIMAZOLE-BETAMETHASONE) apply bid  #15 gms x 3   Entered and Authorized by:   Judithann Sheen MD   Signed by:   Judithann Sheen  MD on 10/07/2009   Method used:   Electronically to        [id number] (retail)       [address]       [place], [place]  [address]       Ph: [phone] or [phone]       Fax: [phone]   RxID:   [id number]

## 2010-03-22 NOTE — Letter (Signed)
[facility]   Imported By: Maryln Gottron 10/20/2009 13:01:09  _____________________________________________________________________  External Attachment:    Type:   Image     Comment:   External Document

## 2010-03-22 NOTE — Procedures (Signed)
Summary: Colonoscopy Report/Dr. Sharrell Ku  Colonoscopy Report/Dr. Sharrell Ku   Imported By: Maryln Gottron 12/13/2009 13:56:57  _____________________________________________________________________  External Attachment:    Type:   Image     Comment:   External Document

## 2010-03-22 NOTE — Letter (Signed)
[facility]   Imported By: Maryln Gottron 06/18/2009 15:11:58  _____________________________________________________________________  External Attachment:    Type:   Image     Comment:   External Document

## 2010-03-24 ENCOUNTER — Ambulatory Visit (HOSPITAL_BASED_OUTPATIENT_CLINIC_OR_DEPARTMENT_OTHER): Payer: BC Managed Care – PPO | Admitting: Oncology

## 2010-03-24 DIAGNOSIS — D45 Polycythemia vera: Secondary | ICD-10-CM

## 2010-03-24 DIAGNOSIS — B1920 Unspecified viral hepatitis C without hepatic coma: Secondary | ICD-10-CM

## 2010-03-24 NOTE — Assessment & Plan Note (Signed)
Summary: EAR INF/VJ   Vital Signs:  Patient profile:   60 year old female Height:      61 inches Weight:      152 pounds BMI:     28.82 Temp:     98.0 degrees F oral Pulse rate:   65 / minute BP sitting:   110 / 62  (left arm) Cuff size:   regular  Vitals Entered By: Payton Spark CMA (February 26, 2010 12:07 PM) CC: L ear infection   CC:  L ear infection.  History of Present Illness: [name] is a 60 y/o fem.exsmoker..who comes in fro eval of st,npcough, and l ear ache x 5 days.........ros neg expt. fever/chills at onset.....granddaughter also has a cold  Preventive Screening-Counseling & Management  Alcohol-Tobacco     Smoking Status: quit  Current Medications (verified): 1)  Alprazolam 0.5 Mg  Tabs (Alprazolam) .... Take 1 Tablet By Mouth Three Times A Day 2)  Metoprolol Succinate 100 Mg Xr24h-Tab (Metoprolol Succinate) .Marland Kitchen.. 1 By Mouth Once Daily 3)  Imodium Advanced 2-125 Mg Chew (Loperamide-Simethicone) .... 2 Onset Diarrhea, 1 After Each Loose Stool 4)  Aspir-Low 81 Mg Tbec (Aspirin) .Marland Kitchen.. 1 Once Daily 5)  Plavix 75 Mg Tabs (Clopidogrel Bisulfate) .... Per Dr Deborah Chalk 6)  Valtrex 1 Gm Tabs (Valacyclovir Hcl) .Marland Kitchen.. 1 Three Times A Day For Shingles  Allergies (verified): No Known Drug Allergies  Past History:  Past medical, surgical, family and social histories (including risk factors) reviewed for relevance to current acute and chronic problems.  Past Medical History: Reviewed history from 01/06/2009 and no changes required. Hepatitis C Hypertension Polycythemia 2009  Past Surgical History: Reviewed history from 01/06/2009 and no changes required. 2 stints on Oct 15 2008, Dr. Rudean Haskell carotid endarterectomy 2007  Family History: Reviewed history and no changes required.  Social History: Reviewed history from 06/16/2008 and no changes required.  Former Smoker Smoking Status:  quit  Review of Systems      See HPI   Problems:  Medical Problems Added: 1)   Dx of Viral Infection-unspec  (ICD-079.99) 2)  Dx of Asthma, Acute  (ZOX-096.04)  Impression & Recommendations:  Problem # 1:  VIRAL INFECTION-UNSPEC (ICD-079.99) Assessment New  Her updated medication list for this problem includes:    Aspir-low 81 Mg Tbec (Aspirin) .Marland Kitchen... 1 once daily    Hydromet 5-1.5 Mg/78ml Syrp (Hydrocodone-homatropine) .Marland Kitchen... 1/2 tsp three times a day prn  Problem # 2:  ASTHMA, ACUTE (VWU-981.19) Assessment: New  The following medications were removed from the medication list:    Prednisone 20 Mg Tabs (Prednisone) .Marland Kitchen... 1 three times a day pc for 3 days then 1 bidpc for 6 daysthen 1 each day for shingles and arthritis  Complete Medication List: 1)  Alprazolam 0.5 Mg Tabs (Alprazolam) .... Take 1 tablet by mouth three times a day 2)  Metoprolol Succinate 100 Mg Xr24h-tab (Metoprolol succinate) .Marland Kitchen.. 1 by mouth once daily 3)  Imodium Advanced 2-125 Mg Chew (Loperamide-simethicone) .... 2 onset diarrhea, 1 after each loose stool 4)  Aspir-low 81 Mg Tbec (Aspirin) .Marland Kitchen.. 1 once daily 5)  Plavix 75 Mg Tabs (Clopidogrel bisulfate) .... Per dr Deborah Chalk 6)  Valtrex 1 Gm Tabs (Valacyclovir hcl) .Marland Kitchen.. 1 three times a day for shingles 7)  Hydromet 5-1.5 Mg/14ml Syrp (Hydrocodone-homatropine) .... 1/2 tsp three times a day prn  Patient Instructions: 1)  drink lots of water 2)  vaporizer at bedtime 3)  prednisone 20 mg.......2 x 3days, 1 x 3 days, 1/2  x 3 days.Marland Kitchenthen 1/2 every other day x 2 weeks 4)  hydromet !/2 tsp three times a day prn Prescriptions: HYDROMET 5-1.5 MG/5ML SYRP (HYDROCODONE-HOMATROPINE) 1/2 tsp three times a day prn  #4oz x 1   Entered and Authorized by:   Roderick Pee MD   Signed by:   Roderick Pee MD on 02/26/2010   Method used:   Print then Give to Patient   RxID:   [id number]    Orders Added: 1)  Est. Patient Level IV [56213]

## 2010-03-31 ENCOUNTER — Encounter (HOSPITAL_BASED_OUTPATIENT_CLINIC_OR_DEPARTMENT_OTHER): Payer: BC Managed Care – PPO | Admitting: Oncology

## 2010-03-31 DIAGNOSIS — B1920 Unspecified viral hepatitis C without hepatic coma: Secondary | ICD-10-CM

## 2010-03-31 DIAGNOSIS — D45 Polycythemia vera: Secondary | ICD-10-CM

## 2010-05-09 LAB — COMPREHENSIVE METABOLIC PANEL
BUN: 10 mg/dL (ref 6–23)
CO2: 24 mEq/L (ref 19–32)
Chloride: 107 mEq/L (ref 96–112)
Creatinine, Ser: 0.97 mg/dL (ref 0.4–1.2)
GFR calc non Af Amer: 59 mL/min — ABNORMAL LOW (ref >60)
Total Bilirubin: 0.6 mg/dL (ref 0.3–1.2)

## 2010-05-09 LAB — URINE MICROSCOPIC-ADD ON

## 2010-05-09 LAB — LIPASE, BLOOD: Lipase: 38 U/L (ref 11–59)

## 2010-05-09 LAB — CBC
HCT: 48.1 % — ABNORMAL HIGH (ref 36.0–46.0)
MCV: 89.4 fL (ref 78.0–100.0)
Platelets: 296 10*3/uL (ref 150–400)
RBC: 5.38 MIL/uL — ABNORMAL HIGH (ref 3.87–5.11)
WBC: 15.2 10*3/uL — ABNORMAL HIGH (ref 4.0–10.5)

## 2010-05-09 LAB — GLUCOSE, CAPILLARY: Glucose-Capillary: 103 mg/dL — ABNORMAL HIGH (ref 70–99)

## 2010-05-09 LAB — POCT CARDIAC MARKERS
CKMB, poc: 1.7 ng/mL (ref 1.0–8.0)
Myoglobin, poc: 41.7 ng/mL (ref 12–200)

## 2010-05-09 LAB — URINALYSIS, ROUTINE W REFLEX MICROSCOPIC
Bilirubin Urine: NEGATIVE
Hgb urine dipstick: NEGATIVE
Ketones, ur: NEGATIVE mg/dL
Protein, ur: NEGATIVE mg/dL
Urobilinogen, UA: 0.2 mg/dL (ref 0.0–1.0)

## 2010-05-09 LAB — DIFFERENTIAL
Basophils Absolute: 0.1 10*3/uL (ref 0.0–0.1)
Lymphocytes Relative: 25 % (ref 12–46)
Neutro Abs: 10.4 10*3/uL — ABNORMAL HIGH (ref 1.7–7.7)

## 2010-05-23 ENCOUNTER — Other Ambulatory Visit: Payer: Self-pay | Admitting: Obstetrics and Gynecology

## 2010-05-23 DIAGNOSIS — Z1231 Encounter for screening mammogram for malignant neoplasm of breast: Secondary | ICD-10-CM

## 2010-05-28 LAB — BASIC METABOLIC PANEL
Chloride: 107 mEq/L (ref 96–112)
Creatinine, Ser: 0.75 mg/dL (ref 0.4–1.2)
GFR calc Af Amer: >60 mL/min (ref >60)
Sodium: 138 mEq/L (ref 135–145)

## 2010-05-28 LAB — CBC
Hemoglobin: 12.8 g/dL (ref 12.0–15.0)
MCV: 89.5 fL (ref 78.0–100.0)
RBC: 4.34 MIL/uL (ref 3.87–5.11)
WBC: 6.2 10*3/uL (ref 4.0–10.5)

## 2010-06-09 ENCOUNTER — Ambulatory Visit: Payer: BC Managed Care – PPO

## 2010-06-14 ENCOUNTER — Ambulatory Visit
Admission: RE | Admit: 2010-06-14 | Discharge: 2010-06-14 | Disposition: A | Discharge status: Home / Self Care | Payer: BC Managed Care – PPO | Source: Ambulatory Visit | Attending: Obstetrics and Gynecology | Admitting: Obstetrics and Gynecology

## 2010-06-14 DIAGNOSIS — Z1231 Encounter for screening mammogram for malignant neoplasm of breast: Secondary | ICD-10-CM

## 2010-06-23 ENCOUNTER — Other Ambulatory Visit (INDEPENDENT_AMBULATORY_CARE_PROVIDER_SITE_OTHER): Payer: BC Managed Care – PPO | Admitting: Family Medicine

## 2010-06-23 DIAGNOSIS — Z0000 Encounter for general adult medical examination without abnormal findings: Secondary | ICD-10-CM

## 2010-06-23 LAB — CBC WITH DIFFERENTIAL/PLATELET
Basophils Absolute: 0 10*3/uL (ref 0.0–0.1)
Eosinophils Absolute: 0.1 10*3/uL (ref 0.0–0.7)
HCT: 45.7 % (ref 36.0–46.0)
Hemoglobin: 15.5 g/dL — ABNORMAL HIGH (ref 12.0–15.0)
Lymphs Abs: 2.2 10*3/uL (ref 0.7–4.0)
MCHC: 33.9 g/dL (ref 30.0–36.0)
Neutro Abs: 3.3 10*3/uL (ref 1.4–7.7)
RDW: 13.9 % (ref 11.5–14.6)

## 2010-06-23 LAB — BASIC METABOLIC PANEL
CO2: 27 mEq/L (ref 19–32)
Calcium: 9.3 mg/dL (ref 8.4–10.5)
Glucose, Bld: 102 mg/dL — ABNORMAL HIGH (ref 70–99)
Potassium: 3.8 mEq/L (ref 3.5–5.1)
Sodium: 137 mEq/L (ref 135–145)

## 2010-06-23 LAB — LIPID PANEL
Cholesterol: 155 mg/dL (ref 0–200)
HDL: 48.1 mg/dL (ref >39.00)
VLDL: 20.8 mg/dL (ref 0.0–40.0)

## 2010-06-23 LAB — HEPATIC FUNCTION PANEL: Albumin: 3.6 g/dL (ref 3.5–5.2)

## 2010-06-23 LAB — POCT URINALYSIS DIPSTICK
Ketones, UA: NEGATIVE
Protein, UA: NEGATIVE
Spec Grav, UA: 1.025
pH, UA: 5.5

## 2010-06-23 LAB — TSH: TSH: 5.59 u[IU]/mL — ABNORMAL HIGH (ref 0.35–5.50)

## 2010-06-30 ENCOUNTER — Encounter: Payer: Self-pay | Admitting: Family Medicine

## 2010-06-30 ENCOUNTER — Ambulatory Visit (INDEPENDENT_AMBULATORY_CARE_PROVIDER_SITE_OTHER): Payer: BC Managed Care – PPO | Admitting: Family Medicine

## 2010-06-30 VITALS — BP 100/62 | HR 74 | Temp 98.3°F | Ht 60.6 in | Wt 152.0 lb

## 2010-06-30 DIAGNOSIS — I2510 Atherosclerotic heart disease of native coronary artery without angina pectoris: Secondary | ICD-10-CM

## 2010-06-30 DIAGNOSIS — Z0000 Encounter for general adult medical examination without abnormal findings: Secondary | ICD-10-CM

## 2010-06-30 DIAGNOSIS — F411 Generalized anxiety disorder: Secondary | ICD-10-CM

## 2010-06-30 DIAGNOSIS — D751 Secondary polycythemia: Secondary | ICD-10-CM

## 2010-06-30 DIAGNOSIS — B159 Hepatitis A without hepatic coma: Secondary | ICD-10-CM

## 2010-06-30 DIAGNOSIS — E039 Hypothyroidism, unspecified: Secondary | ICD-10-CM

## 2010-06-30 DIAGNOSIS — F419 Anxiety disorder, unspecified: Secondary | ICD-10-CM

## 2010-06-30 MED ORDER — LEVOTHYROXINE SODIUM 100 MCG PO TABS: 100.0000 ug | ORAL_TABLET | Freq: Every day | ORAL | Status: DC

## 2010-06-30 MED ORDER — RANITIDINE HCL 300 MG PO TABS: 300.0000 mg | ORAL_TABLET | Freq: Every day | ORAL | Status: DC

## 2010-06-30 NOTE — Patient Instructions (Addendum)
You have hypothyroidism and to tx with synthroid , return 6 weeks for TSH Will find out about new Hep C treatment Refilled alprazolam

## 2010-07-01 ENCOUNTER — Encounter (HOSPITAL_BASED_OUTPATIENT_CLINIC_OR_DEPARTMENT_OTHER): Payer: BC Managed Care – PPO | Admitting: Hematology & Oncology

## 2010-07-01 DIAGNOSIS — D45 Polycythemia vera: Secondary | ICD-10-CM

## 2010-07-01 DIAGNOSIS — B1920 Unspecified viral hepatitis C without hepatic coma: Secondary | ICD-10-CM

## 2010-07-05 NOTE — Discharge Summary (Signed)
NAMECAELIE, REMSBURG NO.:  [id number]   MEDICAL RECORD NO.:  [id number]          PATIENT TYPE:  INP   LOCATION:  2507                         FACILITY:  [facility]   PHYSICIAN:  Colleen Can. Deborah Chalk, M.D.DATE OF BIRTH:  1950-04-06   DATE OF ADMISSION:  10/15/2008  DATE OF DISCHARGE:                               DISCHARGE SUMMARY   DISCHARGE DIAGNOSES:  1. Left arm pain with subsequent elective cardiac catheterization and      sequential stents placed in the left circumflex (drug-eluting).  2. Hypertension.  3. Hepatitis C.  4. Polycythemia.  5. Ongoing tobacco abuse.  6. Previous history of right carotid endarterectomy.   HISTORY OF PRESENT ILLNESS:  The patient is 60 year old female who has  multiple cardiovascular risk factors.  She presented to our office as a  walk-in appointment with significant left arm aching that began on the  morning after waking up.  She described this as an achy-like sensation  that radiated down the entire arm.  In the office, she was treated with  nitroglycerin with very prompt response.  Cardiac catheterization as  well as admission to the hospital was recommended.  She refused to be  admitted.  She presents for outpatient cardiac catheterization.   Please see the history and physical for further patient presentation and  profile.   LABORATORY DATA ON ADMISSION:  Her cardiac enzymes were negative.  PT  and PTT were unremarkable.  CBC was normal and chemistries were  satisfactory.  Her AST is 58 and ALT is 40.   HOSPITAL COURSE:  The patient was admitted as an outpatient in order to  undergo cardiac catheterization.  That procedure was tolerated well  without any known complications.  The left main was normal.  The left  circumflex had a long segment of diffuse stenosis with an area in the  mid section with a continuation branch and a small obtuse marginal that  does not have severe obstruction.  The most significant focal  stenosis  within the segmental area was 90%.  The LAD was heavily calcified and  had moderate stenosis scattered throughout, but no greater than 30-40%.  The right coronary artery is a dominant vessel and has 60-70% stenosis  in the segment before the crux, but the stenotic area is associated with  a relatively small distal vessel.  The ejection fraction was 60%.  Subsequent stenting to the left circumflex occurred with 2 stents  placed, a 2.5 x 23 mm in the mid circ and a 2.5 x 12 mm in the proximal  circ.  Overall, satisfactory result was obtained and postprocedure, she  was transferred to 2500.  Today, on October 16, 2008, she is doing well  without complaints.  She has had no recurrence of left arm pain or chest  pain.  Her postprocedure lab is satisfactory.  She is felt to be a  satisfactory candidate for discharge.  It will be our plan for her to  undergo outpatient stress testing to assess the stenosis of the right  coronary.  It may be that she will require a repeat  intervention in  approximately 4 weeks.   DISCHARGE CONDITION:  Stable.   DISCHARGE DIET:  Low salt, heart healthy.   Discharge activity is to be increased slowly.   She is to use an ice pack if needed to the right groin.   She is strongly encouraged to stop smoking and smoking consult has been  called for prior to discharge.  We will need to see her back in  approximately 1 week.  At that time, we will check fasting CMET and  lipids and hopefully will be able to start low-dose simvastatin with  close monitoring of LFTs.   DISCHARGE MEDICINES:  1. Toprol-XL 100 mg a day.  2. Enteric-coated aspirin 325 mg a day.  3. Plavix 75 mg a day for 1 year.  4. Multivitamin daily.  5. Nitroglycerin p.r.n.  6. Xanax at bedtime.  7. Vitamin A and B12 as she was taking before.  8. The isosorbide that she had been placed on as an outpatient has      been discontinued.   She is to call if any problems arise in the  interim.   Greater than 30 minutes spent for discharge.      Sharlee Blew, N.P.      Colleen Can. Deborah Chalk, M.D.  Electronically Signed    LC/MEDQ  D:  10/16/2008  T:  10/16/2008  Job:  [id number]

## 2010-07-05 NOTE — Cardiovascular Report (Signed)
NAMEOMER, MONTER NO.:  [id number]   MEDICAL RECORD NO.:  [id number]          PATIENT TYPE:  INP   LOCATION:  2507                         FACILITY:  [facility]   PHYSICIAN:  Colleen Can. Deborah Chalk, M.D.DATE OF BIRTH:  12-12-1950   DATE OF PROCEDURE:  10/15/2008  DATE OF DISCHARGE:                            CARDIAC CATHETERIZATION   PROCEDURES:  Left heart catheterization with selective coronary  angiography, left ventricular angiography, and a sequential stent placed  in the left circumflex coronary artery (drug eluting).   TYPE AND SITE OF ENTRY:  Percutaneous right femoral artery with Angio-  Seal.   CATHETERS:  A 6-French JL4 left coronary catheter, 6-French right  coronary catheter, 6-French pigtail ventriculographic catheter, guide  catheter, a 6-French JL4 guide, Prowater guidewire, predilatation with a  2.5 x 20 mm Apex distally and a 2.5 x 12 mm Voyager proximally with  stents being a 2.5 x 23 mm XIENCE stent placed distally and a 2.5 x 12  mm XIENCE stent proximally.   MEDICATION GIVEN PRIOR TO PROCEDURE:  Valium 10 mg p.o.   MEDICATIONS GIVEN DURING THE PROCEDURE:  Versed 4 mg IV, fentanyl 25  mcg, Plavix, Angiomax, and Ancef.   COMMENTS:  The patient tolerated the procedure well.   HEMODYNAMIC DATA:  The aortic pressure was 133/66, LV was 129/6-12.  There was no aortic valve gradient noted on pullback.   ANGIOGRAPHIC DATA:  1. Left main coronary artery is normal.  2. The left circumflex has a long segment of a diffuse stenosis with      an area in the mid section with a continuation branch and a small      obtuse marginal that does not have severe obstruction.  The most      significant focal stenosis was within the segmental area of disease      was 90%.  There was diffuse 60-80% stenosis throughout this vessel.      Distally, the left circumflex appeared to be relatively normal.  3. The left anterior descending was heavily calcified.  There  was      diffuse moderate stenosis scattered throughout the left anterior      descending and diagonal systems but no greater than 30-40%      narrowing.  There were 2 diagonal vessels present.  4. Right coronary artery:  The right coronary artery is a dominant      vessel.  There is 60-70% stenosis in its segment before the crux,      but the stenotic area is associated with a relatively small distal      vessel.  There is a larger right ventricular branch that arises in      the right coronary artery prior to the stenotic segments.  While      this is moderately severe to severe, it does have a potential of      being flow limiting but is borderline.   Left ventricular angiogram was performed in the RAO position.  Overall,  cardiac size and silhouette were normal, global ejection fraction is  60%.  Regional wall motion  is normal.   ANGIOPLASTY PROCEDURE:  We used a JL4 guide, the 6-French.  Prowater  guidewire was passed into the course of the left circumflex.  We  predilated it with a 2.5 x 20 mm Apex balloon in the more distal part of  the segments and then tried to pass a 23-mm stent directly beyond more  proximal stenosis without predilatation.  We were unable to pass the  stent.  We then returned and predilated the more proximal lesion with a  2.5 x 12 mm Voyager balloon.   We then returned and placed the 2.5 x 23 mm XIENCE (drug-eluting stent)  in the left circumflex just distal to the 2 largest branches.  This  stent was inflated to 12 atmospheres with full inflation and no residual  stenosis.  We then returned with a 12 mm x 2.5 mm XIENCE stent and  placed it proximally.  We left approximately 3-5 mm between the stents  that would correspond to the area where both the branch in the AV groove  and a small marginal arise.  This area between the 2 stents was left  untreated.  We inflated the more proximal XIENCE to 12 atmospheres with  a nice inflation and a smooth contour.    The patient developed a chest pain with stent and balloon inflation.  This was different from the left arm discomfort and toothache that she  had been experiencing that lead to the catheterization.   OVERALL IMPRESSION:  1. Severe stenosis in the left circumflex system with successful stent      placement in sequential fashion.  2. There is residual moderately severe distal right coronary artery      stenosis.  3. Mild to moderate atherosclerosis in the left anterior descending.  4. Normal left ventricular function.   PLAN:  The patient will be managed medically and encouraged to modify  cardiovascular risk factors.  She will need to have followup stress  testing and consideration of treatment of the right coronary artery.      Colleen Can. Deborah Chalk, M.D.  Electronically Signed     SNT/MEDQ  D:  10/15/2008  T:  10/16/2008  Job:  [id number]

## 2010-07-05 NOTE — H&P (Signed)
[name], [name] NO.:  [id number]   MEDICAL RECORD NO.:  [id number]           PATIENT TYPE:  INP   LOCATION:  2507                         FACILITY:  [facility]   PHYSICIAN:  Colleen Can. Deborah Chalk, M.D.DATE OF BIRTH:  Nov 29, 1950   DATE OF ADMISSION:  10/15/2008  DATE OF DISCHARGE:                              HISTORY & PHYSICAL   CHIEF COMPLAINT:  Chest pain.   HISTORY OF PRESENT ILLNESS:  [name] is a 60 year old white female  who has multiple cardiovascular risk factors.  She presents to our  office as a work-in appointment on Wednesday afternoon with significant  left arm aching.  She notes that she woke up with it on the morning  prior to admission.  She describes it as an achy like sensation.  It  radiates down the entire arm.  Overall, she has not really felt well for  the last couple of days.  She has significant fatigue that she feels is  more out of proportion than what she usually suffers from.  She has had  2 episodes in the last 2 evenings where she became very dizzy.  She was  given nitroglycerin in the office with prompt relief of symptoms.  She  was referred for admission, however, she refused.  She now presents for  outpatient cardiac catheterization.   PAST MEDICAL HISTORY:  1. History of right carotid endarterectomy.  2. Hypertension.  3. Hepatitis C  4. Polycythemia.  5. Ongoing tobacco abuse.  6. Situational stress.  7. Remote history of vertigo.  8. Past history of TIAs in August 2007, which led to her right carotid      endarterectomy.   Surgical history includes:  1. Lymph nodes removed from the left groin.  2. Nodule removed from the vocal cord.  3. Two liver biopsies in the past.  4. History of miscarriage.   Allergies are questionably BENADRYL.   CURRENT MEDICINES:  1. Metoprolol ER 100 mg a day.  2. Aspirin daily.  3. Xanax at bedtime.  4. Multivitamin daily.  5. Vitamin E and B12 daily.   FAMILY HISTORY:  Father died  at 30 with arthritis, heart disease, and  hypertension and had cancer and diabetes.  Her mother has lived up into  her 75s.   SOCIAL HISTORY:  She is divorced.  She lives at home alone.  She has 2  children.  She smokes a pack of cigarettes a day and does have  occasional alcohol use.   REVIEW OF SYSTEMS:  She has had no recent fever, flu, or cough.  She is  chronically short of breath, but thinks that might have gotten worse  here over the last several days.  Her energy level is significantly  reduced, especially here over the last several days as well.  She has  never really had chest pain per se.  She has had some occasional  headaches.  She has had a few dizzy spells, but no frank syncope.  She  has had no abdominal pain, constipation, or diarrhea.  She does not wish  to take statins for  her cholesterol levels.  All other review of systems  are negative.   PHYSICAL EXAMINATION:  GENERAL:  She is somewhat anxious.  She is in no  acute distress.  She was complaining of significant left arm pain at the  time of my exam.  VITAL SIGNS:  Blood pressure initially was 124/76 and repeat by me was  150/60.  Heart rate 72 and regular, weight was 137 pounds, respiratory  rate was 18 and regular.  SKIN:  Warm and dry.  Color is suntanned.  HEENT:  Normocephalic, atraumatic.  Pupils are equal and reactive.  Sclerae were nonicteric.  NECK:  Supple.  No masses.  She does have a right carotid endarterectomy  scar.  There were no bruits that I could appreciate.  LUNGS:  Coarse.  CARDIAC:  Regular rhythm.  There is no murmur.  ABDOMEN:  Soft, positive bowel sounds, nontender.  EXTREMITIES:  Without edema.  M/S: Gait and range of motion are intact.  NEUROLOGIC:  No gross focal deficits.   PERTINENT LABORATORY DATA:  Her EKG was normal and showed sinus rhythm.   OVERALL IMPRESSION:  1. Significant left arm pain.  2. Multiple cardiovascular risk factors (postmenopausal state, history      of  carotid endarterectomy, elevated cholesterol levels, ongoing      tobacco abuse, and hypertension).  3. History of hepatitis C.   PLAN:  She was advised to be admitted to the hospital to be placed on IV  nitroglycerin and IV heparin.  However, she refused.  She was treated in  the office initially with sublingual nitroglycerin with prompt relief of  her symptoms.  She wishes to pursue an outpatient testing.  She was  placed on Plavix 75 mg a day as well as Imdur 60 mg a day.  Sublingual  nitroglycerin was made available to her as well with full instruction.  We will make plans for her to undergo cardiac catheterization tomorrow  on October 15, 2008.  If she has further problems here tonight, she is to  proceed on to the emergency room.      Sharlee Blew, N.P.      Colleen Can. Deborah Chalk, M.D.  Electronically Signed   LC/MEDQ  D:  10/14/2008  T:  10/15/2008  Job:  [id number]

## 2010-07-05 NOTE — Procedures (Signed)
CAROTID DUPLEX EXAM   INDICATION:  Followup evaluation of known carotid artery disease.   HISTORY:  Diabetes:  No.  Cardiac:  No.  Hypertension:  No.  Smoking:  Less than a pack per day, recently restarted smoking.  Previous Surgery:  Right carotid endarterectomy with Dacron patch  angioplasty on 10/06/2005, by Dr. Madilyn Fireman.  CV History:  The patient complains of right arm numbness and headaches  which have recently began occurring.  Patient had a right eye amaurosis  fugax episode prior to her endarterectomy.  Previous study on 04/19/2006  revealed a 20% to 39% right ICA stenosis, status post endarterectomy,  and a 40% to 59% left ICA stenosis (low end of scale).  Amaurosis Fugax:  Yes, Paresthesias:  Yes, Hemiparesis:  No                                       RIGHT             LEFT  Brachial systolic pressure:         152               152  Brachial Doppler waveforms:         Triphasic         Triphasic  Vertebral direction of flow:        Antegrade         Antegrade  DUPLEX VELOCITIES (cm/sec)  CCA peak systolic                   62                56  ECA peak systolic                   122               31  ICA peak systolic                   90                109  ICA end diastolic                   36                35  PLAQUE MORPHOLOGY:                  Mixed             Calcified  PLAQUE AMOUNT:                      Mild              Irregular  PLAQUE LOCATION:                    Proximal and distal to the Dacron  patch                               Proximal ECA, proximal ICA   IMPRESSION:  1. 20% to 39% right internal carotid artery stenosis status post      endarterectomy.  2. 20% to 39% left internal carotid artery stenosis.  3. Left external carotid artery stenosis.  4. No significant change since the previous study performed  04/19/2006.   ___________________________________________  P. Liliane Bade, M.D.   MC/MEDQ  D:  02/11/2007  T:   02/12/2007  Job:  [id number]

## 2010-07-08 NOTE — Op Note (Signed)
[facility]  Patient:    [name], [name] Visit Number: [id number] MRN: [id number]          Service Type: DSU Location: [facility] Attending Physician:  Lenoard Aden Dictated by:   Lenoard Aden, M.D. Proc. Date: 11/01/00 Admit Date:  11/01/2000                             Operative Report  PREOPERATIVE DIAGNOSES:       Persistent right ovarian cyst, postmenopausal bleeding.  POSTOPERATIVE DIAGNOSES:      Endometrial polyp and severe pelvic abdominal adhesions.  OPERATION:  SURGEON:                      Lenoard Aden, M.D.  ASSISTANT:                    Sung Amabile. Roslyn Smiling, M.D.  ANESTHESIA:                   General.  ESTIMATED BLOOD LOSS:         Less than 50 cc.  COMPLICATIONS:                None.  DRAINS:                       None.  COUNTS:                       Correct.  DISPOSITION:                  Patient to recovery in good condition.  DESCRIPTION OF PROCEDURE:     After being apprised of the risks of anesthesia, infection, bleeding, uterine perforation with need for repair, inability to cure postmenopausal bleeding, inability to diagnose cancer, patient is brought to the operating room where is administered general anesthetic without complication and prepped and draped in the usual sterile fashion.  After achieving adequate anesthesia, single-tooth tenaculum placed on the cervix, dilute Pitressin solution 10 cc infiltrated, 3 and 9 oclock at the cervicovaginal junction, cervix dilated easily up to #[facility] dilator, diagnostic hysteroscope placed.  Visualization reveals posterior wall thickening in two areas consistent with endometrial polyps which were resected using the right-angle loop.  D&C is performed.  Revisualization reveals normal anterior wall, normal tubal ostia, normal endocervical canal.  Procedure is terminated.  Deficit of 20 cc is noted.  Attention is turned to the abdominal portion of the procedure.   After placement of a Hulka tenaculum per vagina, dilute Marcaine solution placed, Veress needle placed, opening pressure of -2 noted, 5 L of CO2 insufflated without difficulty, diagnostic laparoscope placed.  Visualization reveals normal liver/gallbladder area with normal, atraumatic trocar entry.  At this time, visualization reveals extensive adhesions of the omentum to the anterior abdominal wall.  No transparencies are noted with the ability to dissect these adhesions.  Due to the fact that this is a simple right ovarian cyst which has been persistent but shows no suspicious characteristics, with a normal CA125, the decision is made not to proceed with exploratory laparotomy for followup visualization of this cyst and decision is made to follow this cyst on ultrasound.  At this time, CO2 is released and trocar and scope are removed under direct visualization, incision closed using 0 and 4-0 Vicryl.  Patient tolerates procedure well, is awakened and  transferred to recovery in good condition. Dictated by:   Lenoard Aden, M.D. Attending Physician:  Lenoard Aden DD:  11/01/00 TD:  11/01/00 Job: [id number]

## 2010-07-08 NOTE — H&P (Signed)
[facility]  Patient:    [name], [name] Visit Number: [id number] MRN: [id number]          Service Type: Attending:  Lenoard Aden, M.D. Dictated by:   Lenoard Aden, M.D. Adm. Date:  11/01/00                           History and Physical  CHIEF COMPLAINT:              Pelvic pain, postmenopausal bleeding, questionable endometrial mass, questionable ovarian cyst on ultrasound.  HISTORY OF PRESENT ILLNESS:   The patient is a 60 year old white female, G2, P1 with endometrial mass and postmenopausal bleeding and persistent right ovarian cyst measuring 3 x 3 cm unchanged over multiple ultrasounds performed over the last 2-3 months. She presents for definitive evaluation due to worsening pain and irregular bleeding.  PAST MEDICAL HISTORY:         Remarkable for three pregnancies, one uncomplicated abortion, two uncomplicated vaginal deliveries. Denies any surgical hospitalizations.  MEDICATIONS:                  Toprol for hypertension and Xanax p.r.n. for sleep and a multivitamin.  ALLERGIES:                    BENADRYL.  CIGARETTES:                   She is a pack and a half a day smoker.  FAMILY HISTORY:               Hypertension, breast cancer, diabetes and heart disease. She denies domestic or physical violence.  PHYSICAL EXAMINATION:  GENERAL:                      She is well-developed, well-nourished, white female in no apparent distress.  HEENT:                        Normal.  LUNGS:                        Clear.  HEART:                        Regular rhythm.  ABDOMEN:                      Soft, nontender.  PELVIC:                       Reveals an anteflexed uterus and right adnexal firmness.  RECTAL:                       Within normal limits.  IMPRESSION:                   1. Symptomatic right ovarian cyst.                               2. Postmenopausal bleeding with questionable   structural defect.  PLAN:                         Proceed with diagnostic laparoscopy, diagnostic hysteroscopy. The risks of anesthesia, infection, bleeding, injury to abdominal organs  and need for repair is discussed at length versus immediate complications to include bowel and bladder injury are noted. The patient acknowledges and desires to proceed. Dictated by:   Lenoard Aden, M.D. Attending:  Lenoard Aden, M.D. DD:  11/01/00 TD:  11/01/00 Job: [id number]

## 2010-07-08 NOTE — H&P (Signed)
Amber, [name] NO.:  [id number]   MEDICAL RECORD NO.:  [id number]          PATIENT TYPE:  INP   LOCATION:  2003                         FACILITY:  [facility]   PHYSICIAN:  Balinda Quails, M.D.    DATE OF BIRTH:  10/21/1950   DATE OF ADMISSION:  10/05/2005  DATE OF DISCHARGE:                                HISTORY & PHYSICAL   CHIEF COMPLAINT:  Left arm numbness and headaches.   HISTORY OF PRESENT ILLNESS:  This is a 60 year old, Caucasian female with  past medical history of hypertension, heavy tobacco abuse and hepatitis C  from blood transfusion in the 1970s.  The patient went for a routine  physical about a month ago.  At that time, she complained of spots in her  right eye and was recommended to see an eye doctor.  The patient has  procrastinated seeing an eye doctor.  About a week and half ago, the patient  complained of her right eye blacking out where she was unable to see  anything for a few seconds.  The patient subsequently made an appointment to  see the eye doctor on Monday.  The patient's eye doctor referred her to see  her medical doctor on Wednesday who then had ordered Doppler studies.  The  patient's Doppler studies were done today on October 05, 2005, and she was  immediately sent to see Dr. Madilyn Fireman at that CVTS office.  Dr. Madilyn Fireman  subsequently sent the patient to be admitted at [facility] for  further evaluation of her symptoms.  The patient does complain of headache  for about a month now in the posterior part of her head on the right side.  The patient does state that she takes Tylenol which does effectively relieve  the headache, however, then it does return.  The patient has been  complaining of some left arm numbness since yesterday.  This is  intermittent.  The patient does not have any muscle weakness.  The patient  is complaining of amaurosis fugax, visual changes of the right eye and  headaches.  The patient does say that she  has had intermittent chest pain  and shortness of breath with exertion for many years.  She does complain of  heart palpitations at times and this has also been going on for many years.  The patient's last cardiac evaluation was in 2000, which was normal.  The  patient denies any nausea, vomiting, vertigo, dizziness, seizures, muscle  weakness, arthralgias, dysphagia, syncope, presyncope, memory loss or  confusion.   A carotid duplex on October 05, 2005, showed severe calcification in the  right internal carotid artery.  There is mild plaque in the left internal  carotid artery.  Few artery flow was antegrade bilaterally.  A greater than  80% stenosis visualized in the right internal carotid artery and a 60-80%  internal carotid artery stenosis in the left..  The patient has electively  admitted herself to [facility] for further evaluation of her TIA  symptoms.  Dr. Eldridge Dace will see the patient for her cardiology symptoms.  The patient will undergo a right internal carotid endarterectomy tomorrow,  October 06, 2005, if she is cleared by cardiology.  Risks and benefits were  explained to the patient and she has agreed to proceed.   PAST MEDICAL HISTORY:  1. Hypertension.  2. Hepatitis C from blood transfusion in the 1970s.  3. Tobacco abuse.  4. Anxiety.   PAST SURGICAL HISTORY:  1. Colonoscopy 1 month ago.  2. Multiple liver biopsies.  3. Miscarriage in the 1970s, complicated with bleeding which led her to      surgery.   ALLERGIES:  BENADRYL with reaction of hives.   MEDICATIONS:  1. Diovan 80 mg p.o. daily.  2. Aspirin 81 mg p.o. daily.  3. Xanax 0.5 mg p.o. daily.  4. Multivitamin p.o. daily.   REVIEW OF SYSTEMS:  See HPI for pertinent positives and negatives, otherwise  negative for a myocardial infarction, diabetes mellitus type 2 and syncope.   SOCIAL HISTORY:  The patient is single and has two children.  The patient is  a current tobacco abuser and smokes one  pack a day.  The patient denies any  alcohol use.  She does work for [employer] and does still drive.  The  patient resides in [place].   FAMILY HISTORY:  Mother is still living and age 36 with a history of  coronary artery disease.  Father is deceased at 34 due to lung cancer.  The  patient has two sisters living at 52 and 33 who are healthy.   PHYSICAL EXAMINATION:  VITAL SIGNS:  Vital signs are stable.  The patient is  afebrile.  Height 5 feet 1 inch, weight 126.  GENERAL:  A 60 year old, Caucasian female in no acute distress.  HEENT:  Normocephalic, atraumatic.  Pupils equal, round and reactive to  light and accommodation.  Extraocular movements intact.  Oral mucosa pink  and moist.  Sclerae nonicteric.  The patient does not wear dentures.  NECK:  Supple.  A right carotid bruit is heard with auscultation.  RESPIRATORY:  Symmetrical on inspiration.  Unlabored and clear to  auscultation bilaterally.  CARDIAC:  Regular rate and rhythm, no murmur, gallop or rub.  ABDOMEN:  Soft, nontender, nondistended.  Normoactive bowel sounds x4.  GENITALIA/RECTAL:  Deferred.  EXTREMITIES:  No edema.  Lower extremities warm.  Radial, femoral,  popliteal, dorsalis pedis and posterior tibial pulses are 2+ bilaterally.  NEUROLOGIC:  Nonfocal.  Alert and oriented x4.  Gait steady.  Muscle  strength 5+ bilaterally and throughout.  Deep tendon reflexes 2+ and  symmetrical.   ASSESSMENT:  Severe right internal carotid artery stenosis, symptomatic with  amaurosis fugax.   PLAN:  Admit the patient to [facility] on October 05, 2005, under  Dr. Madilyn Fireman service.  The patient will be evaluated for a right carotid  endarterectomy.  See admission orders.  Dr. Madilyn Fireman has seen and evaluated the  patient prior to admission.  The risks and benefits were explained to the  patient in great detail and she agrees to proceed.     Constance Holster, PA      P. Liliane Bade, M.D.  Electronically  Signed    JMW/MEDQ  D:  10/05/2005  T:  10/05/2005  Job:  [id number]   cc:   Corky Crafts, MD

## 2010-07-08 NOTE — Consult Note (Signed)
[name], [name] NO.:  [id number]   MEDICAL RECORD NO.:  [id number]          PATIENT TYPE:  INP   LOCATION:  2003                         FACILITY:  [facility]   PHYSICIAN:  Corky Crafts, MDDATE OF BIRTH:  Aug 16, 1950   DATE OF CONSULTATION:  10/05/2005  DATE OF DISCHARGE:                                   CONSULTATION   REFERRING PHYSICIAN:  Dr. Liliane Bade.   REASON FOR CONSULTATION:  Chest pain and preoperative evaluation.   HISTORY OF PRESENT ILLNESS:  The patient is a 60 year old woman with a  history of hypertension and tobacco abuse.  She was admitted today because  of a carotid stenosis and symptoms of TIA.  She will require endarterectomy  surgery and we are asked to see the patient regarding her chest pain. This  is an urgent consultation because her surgery is planned in the morning.   She describes an occasional tightness in her chest with exertion.  She  states that it is similar to the symptom that she has when her asthma acts  up.  She also has random sharp chest pains that are not related to exertion.  Currently while talking to me and when thinking about her surgery, she feels  a tightness in her chest.   PAST MEDICAL HISTORY:  1. Hypertension.  2. Hepatitis C.  3. Tobacco abuse.  4. Anxiety.   PAST SURGICAL HISTORY:  Liver biopsy.   ALLERGY:  Benadryl.   MEDICATIONS:  Diovan 80 mg daily, aspirin 81 mg daily, Xanax and  multivitamin.   SOCIAL HISTORY:  The patient does smoke a pack per day for many years.  No  alcohol use.  No IV drug abuse.  She works at [employer].   FAMILY HISTORY:  Coronary artery disease in her parents at an advanced age  and no early coronary artery disease.   REVIEW OF SYSTEMS:  No fevers or chills.  She has had sweats recently she  thinks related to menopause.  She does have chest pain, dyspnea on exertion  as described above.  No abdominal pain.  She has had symptoms consistent  with a TIA.   All other systems negative.   PHYSICAL EXAM:  VITAL SIGNS:  Blood pressure 119/82, pulse of 103.  She is  afebrile.  GENERAL:  She is awake and alert in no apparent distress.  NECK:  Right-sided carotid bruit.  CARDIOVASCULAR:  Regular rate and rhythm, S1-S2.  LUNGS:  Clear to auscultation bilaterally.  ABDOMEN:  Soft, nontender, nondistended.  EXTREMITIES:  Showed no edema, 2+ posterior tibial pulses bilaterally.   LABORATORY DATA:  Hemoglobin 18.3, creatinine 0.8, glucose 98.  ECG shows  normal sinus rhythm, no ST-T wave changes, normal ECG.   ASSESSMENT/PLAN:  A 60 year old with multiple types of chest pain.  She has  some symptoms which may be consistent with stable angina.  Of note, she did  have a negative stress test a few years ago with Dr. Katrinka Blazing.   PLAN:  1. Cardiac:  Continue aspirin.  I would also start Toprol XL 25 mg daily  with a goal heart rate 60 beats per minute.  This should be helpful      during the perioperative period to reduce her risk of cardiac events.  2. She will be started on heparin if her MRI is negative.  3. I explained to the patient that she is at moderate risk with this      particular surgery for cardiac event.  However, given the nature of her      surgery and the somewhat atypical nature of the majority of her chest      pain, I think that she should proceed with surgery without any further      cardiac workup.  She is not in heart failure.  She is not having      unstable angina.  The beta blocker should also reduce her perioperative      risks.  She understands her cardiac risk and she also understands that      she does need this carotid surgery.  4. We will also check a troponin given the fact that she has symptoms so      frequently and negative troponin is also reassuring.  Her negative EKG      during her symptoms of chest tightness is also reassuring.  5. Also start Zocor 20 mg daily while she is in the hospital for the anti-       inflammatory effect.  6. Will follow the patient while she is in the hospital.      Corky Crafts, MD  Electronically Signed     JSV/MEDQ  D:  10/05/2005  T:  10/06/2005  Job:  [id number]

## 2010-07-08 NOTE — Discharge Summary (Signed)
NAMEALYCE, INSCORE NO.:  [id number]   MEDICAL RECORD NO.:  [id number]          PATIENT TYPE:  INP   LOCATION:  2037                         FACILITY:  [facility]   PHYSICIAN:  Balinda Quails, M.D.    DATE OF BIRTH:  1950/10/06   DATE OF ADMISSION:  10/05/2005  DATE OF DISCHARGE:  10/09/2005                                 DISCHARGE SUMMARY   ADMISSION DIAGNOSIS:  Severe right internal carotid artery stenosis,  symptomatic with amaurosis fugax.   DISCHARGE/SECONDARY DIAGNOSES:  1. Severe right internal carotid artery stenosis, symptomatic with      amaurosis fugax, status post right carotid endarterectomy.  2. Hypertension.  3. History of hepatitis C secondary to a blood transfusion in the 1970s.  4. Ongoing tobacco abuse.  5. Anxiety.  6. History of multiple liver biopsies.  7. History of miscarriage in the 1970s complicated with bleeding.  8. Colonoscopy one month ago.  9. An allergy to BENADRYL, which causes hives.   BRIEF HISTORY:  [name] is a 60 year old Caucasian female with a past  medical history of hypertension, heavy tobacco use and hepatitis C.  She  went in for a routine physical about one month ago.  At that time, she  complained of spots in her right eye and was recommended to see an  optometrist, her ophthalmologist.  She procrastinated in making the  appointment and about a week and a half ago she complained of her right eye  blacking out, where she was unable to see anything for a few seconds.  She  subsequently made an appointment to see the ophthalmologist on Monday.  He  then had her return to her medical doctor on the following Wednesday for a  carotid Doppler study, which was done on October 05, 2005, showing severe  calcification in the right internal carotid artery, mild plaque in the left  internal carotid artery with vertebral point antegrade bilaterally.  Right,  there was greater than 80% internal carotid artery stenosis and 60%  to 80%  on the left.  She was subsequently sent straight over for vascular surgery  consultation and was seeing Dr. Bud Face at the [facility] office, where he  felt she should be admitted for further monitoring, including a head CT and  IV heparin if no intracranial bleed is noted.  Based on the findings, it had  been determined scheduling for urgent right carotid endarterectomy to reduce  her risk for further symptoms.   HOSPITAL COURSE:  Ms. Brossart was admitted to [facility] on October 05, 2005 for symptomatic right carotid disease with recent episode of  amaurosis fugax.  An MRI was ordered, confirming severe high grade focal  stenosis on the right internal carotid artery, less significant stenosis on  the left of less 50% with focal ulcerative plaque measuring approximately 6  mm in the cephalocaudad, severe left external carotid artery stenosis,  probable narrowing of the proximal brachiocephalic artery and left common  carotid artery, at the common origin.  No intracranial bleed was noted and  she was subsequently started on IV  heparin.  Of note, during her  consultation admission workup, she did report some complaints of frequent  palpitations and nonspecific type chest pain.  As a precaution, a cardiology  consult was requested to obtain clearance for anticipated carotid surgery.  She was seen by cardiologist, Dr. Eldridge Dace.  He did order a set of cardiac  enzymes were completely negative.  Her preoperative EKG also was normal  without signs of infarction.  He did recommend continuing her on aspirin and  starting her on Toprol-XL, which she had been on in the past.  She did  report a negative stress test a few years ago by Dr. Katrinka Blazing.  Since she did  not describe any symptoms consistent with unstable angina, he did feel that  she was appropriate for a carotid endarterectomy and was subsequently  scheduled for the next day, October 06, 2005.  Her postoperative course was   slightly prolonged from typical endarterectomy surgery as she did have some  postoperative dizziness, hypotension and chest pain.  Of note, the patient  did refuse any lab work for cardiac enzymes, but did agree to a  postoperative EKG, which showed no ischemic changes.  Ultimately, it was  felt that her hypotension was most likely secondary to a combination of pain  and anxiety medication in combination with her Diovan and newly started  Toprol.  Subsequently, her ARB was discontinued.  She was also treated with  some IV fluids.  Her chest pain had subsequently resolved.  On postoperative  day 2, she did still have some mild dizziness and lightheadedness and again  was kept another day to improve her mobility and advance her oral intake.  Finally, by postoperative day 3, her dizziness had resolved and she had no  further episodes of chest pain.  She was tolerating regular food and denied  any specific dysphagia, but did report that she had to take smaller, slower  bites with swallowing.  She was sore on the right from her surgery and had  some mild soft tissue swelling, but otherwise reported that it did not  interfere with her breathing or significantly in her swallowing.  She was  able to ambulate without difficulty.  She was maintaining a sinus rhythm.  Her incision was clean, dry and intact without evidence of hematoma.  Neurologically, she remained intact and her tongue was midline.  Her lung  sounds were clear and bladder was functioning appropriately.  Bowel function  was also beginning to return.  Postoperative labs shows a white blood cell  count of 10.5, hemoglobin 15.3, hematocrit 44.4, platelet count 195, sodium  137, potassium 4.0, chloride 106, CO2 30, BUN 6, creatinine 0.7, blood  glucose 120.  Of note, she did report some tenderness in her left  antecubital region, which appeared to be from her previous arterial line site.  This site did have some mild ecchymosis with no  erythema.  It was  somewhat firm and appeared to have, what might have been, a very small  hematoma.  There was no evidence of a bruit there and she had 2 to 3  brachial and radial pulse on the left.  It was felt no specific treatment  was indicated at that time, but the patient was instructed to notify our  office if she developed any further swelling or erythema.   DISPOSITION:  On postoperative day 3, Ms. Landino was felt appropriate for  discharge home.  Again, her dizziness and chest pain had resolved and  cardiac workup, for what she would allow, was negative.  She was evaluated  by cardiology on a daily basis during her hospitalization and they were also  in agreement of her discharge.  Smoking cessation was discussed during her  hospitalization and a nicotine patch was prescribed and encouraged and  continued use.  A statin therapy was also initiated by cardiology prior to  discharge.   DISCHARGE MEDICATIONS:  1. Tylox 1 to 2 tablets p.o. q.4 hours p.r.n. pain.  2. Xanax 0.5 mg p.o. daily.  3. Multivitamin p.o. daily.  4. Enteric-coated aspirin 325 mg p.o. daily.  5. Toprol-XL 50 mg p.o. daily.  6. Nicotine patch 14 mg transdermal daily.  7. Zocor 20 mg p.o. daily.   DISCHARGE INSTRUCTIONS:  She is instructed to continue a low-fat, low-salt  diet.  To avoid driving or heaving lifting for the next 2 weeks, but  increase her activity slowly and continue daily walking exercises.  She may  shower and clean incisions gently with soap and water.  Should avoid placing  lotion directly on her incision site until it is well healed.  She should  call if she develops fever greater than 101 or drainage from her incision  sites.   FOLLOWUP:  She is to followup with Dr. Madilyn Fireman at [facility] office in approximately  2 weeks.  [facility] office will contact her with a specific appointment date and  time.  She should also call and schedule a 4 to 6 week followup appointment  with either her family  doctor or Best boy, Dr. Eldridge Dace or Dr.  Katrinka Blazing where she can have liver function monitoring as statin therapy was  initiated.      Jerold Coombe, P.A.      Balinda Quails, M.D.  Electronically Signed    AWZ/MEDQ  D:  10/09/2005  T:  10/09/2005  Job:  [id number]   cc:   [facility]

## 2010-07-08 NOTE — Procedures (Signed)
[facility]  Patient:    [name], [name]                   MRN: [id number] Proc. Date: 09/12/99 Adm. Date:  [id number] Attending:  Deneen Harts CC:         Leroy Sea., M.D.                           Procedure Report  PROCEDURE:  Percutaneous liver biopsy.  INDICATIONS FOR PROCEDURE:  A 59 year old white female with chronic viral hepatitis C. Biopsied 3 years ago with finding of grade 1 stage 0 disease. Undergoing repeat biopsy to assess disease progression.  DESCRIPTION OF PROCEDURE:  After reviewing the nature of the procedure with the patient including the potential risks of perforation and bleeding, informed consent was signed.  The patient was examined and a site selected in the ninth intercostal space, right mid axillary line with dullness to percussion and full exploration. No auscultatory rub or bruit appreciated. The skin was prepped with Betadine followed by local xylocaine infiltration. Following trocar puncture, using Klatskin liver biopsy technique, a single pass was made. With this, a 2 cm core of grossly normal appearing hepatic tissue was obtained. The wound was dressed and the patient was rotated into a right lateral decubitus position. The specimen was submitted for routine H/E.  The patient received Versed 3 mg, fentanyl 25 mcg administered IV in divided doses prior to the procedure. She was given an additional fentanyl 25 mcg IV immediately following the procedure for right shoulder discomfort. The patient was maintained on low flow oxygen and monitored with datascope throughout. Returned to recovery in stable condition.  IMPRESSION: 1. Successful percutaneous liver biopsy. 2. Chronic viral hepatitis C.  RECOMMENDATIONS: 1. Post procedure orders written. 2. The patient instructions reviewed. 3. Return office visit in 2 weeks to follow-up pathology. DD:  09/12/99 TD:  09/13/99 Job: [id number]

## 2010-07-08 NOTE — Op Note (Signed)
[name], [name] NO.:  [id number]   MEDICAL RECORD NO.:  [id number]          PATIENT TYPE:  INP   LOCATION:  2315                         FACILITY:  [facility]   PHYSICIAN:  Balinda Quails, M.D.    DATE OF BIRTH:  [birth year]   DATE OF PROCEDURE:  10/06/2005  DATE OF DISCHARGE:                                 OPERATIVE REPORT   SURGEON:  Denman George, MD   ASSISTANT:  Jerold Coombe, P.A.   ANESTHETIC:  General endotracheal.   ANESTHESIOLOGIST:  Maren Beach, M.D.   PREOPERATIVE DIAGNOSES:  1. Severe right internal carotid artery stenosis.  2. Transient ischemic attacks.   POSTOPERATIVE DIAGNOSES:  1. Severe right internal carotid artery stenosis.  2. Transient ischemic attacks.   PROCEDURE:  Right carotid endarterectomy, Dacron patch angioplasty.   CLINICAL NOTE:  [name] is a 60 year old female seen in the office  yesterday as an urgent consult.  She had suffered a couple of episodes of  right visual loss, described some heaviness and numbness in her left arm,  and underwent a carotid Doppler evaluation revealing a severe right internal  carotid artery stenosis.  She was admitted to [facility] yesterday,  underwent MRI which was negative for acute stroke, MR angiography verified a  severe right internal carotid artery stenosis.  She was seen preoperatively  by Corky Crafts, MD, for a cardiology consult.  Placed on intravenous  heparin overnight.  Brought to the operating at this time for right carotid  endarterectomy for reduction of stroke risk.  Risks of the operative  procedure were explained to the patient in detail.  The major morbidity and  mortality associated with this procedure 1-2% to include but not limited to  MI, CVA, cranial nerve injury and death.   OPERATIVE PROCEDURE:  The patient brought to the operating room in stable  hemodynamic condition.  Placed under general endotracheal anesthesia.  Arterial  line and Foley catheter inserted.  Right neck prepped and draped in  a sterile fashion.   A curvilinear skin incision made on the anterior border of the right  sternomastoid muscle.  Dissection carried down through subcutaneous tissue  with electrocautery.  Deep dissection carried through the platysma and the  facial vein was exposed.  Facial vein ligated with 2-0 silk.  The carotid  bifurcation exposed.  The common carotid artery mobilized down to the  omohyoid muscle and encircled with a vessel loop.  The vagus nerve reflected  posteriorly and preserved.  The superior thyroid and external carotid origin  were freed and encircled with vessel loops.  The internal carotid artery  followed distally up to the posterior belly of the digastric muscle and  encircled with a vessel loop.  The patient administered 6000 units of  heparin intravenously.  Adequate circulation time permitted.  The carotid  vessels controlled with clamps.  Longitudinal arteriotomy made in the distal  common carotid artery.  The arteriotomy extended across the carotid bulb and  up into the internal carotid artery.  There was a high-grade stenosis at the  origin  of the right internal carotid artery with moderate plaque.  A shunt  was inserted.   The plaque removed with an endarterectomy elevator.  The endarterectomy  carried down to the common carotid artery, where the plaque was divided  transversely with Potts scissors.  Plaque raised up into the bulb and the  superior thyroid and external carotid were endarterectomized using an  eversion technique.  The distal internal carotid artery plaque then  feathered out.  The site irrigated with heparin and saline solution.  Fragments of plaque removed with fine forceps.   The patch angioplasty of the endarterectomy site carried out using running 6-  0 Prolene suture with a Finesse Dacron patch.  At completion of the patch  angioplasty, the shunt was removed.  All vessels  well-flushed.  Clamps  removed directing initial antegrade flow up the external carotid artery,  following this the internal carotid was released.  There was an excellent  pulse and Doppler signal in the distal internal carotid artery.  The patient  administered 50 mg protamine intravenously.  Adequate hemostasis obtained.  Sponge and instrument counts correct.   The sternomastoid fascia closed with running 2-0 Vicryl suture.  The  platysma closed with running 3-0 Vicryl suture.  Skin closed with 4-0  Monocryl.  Steri-Strips applied.  The patient tolerated the procedure well.  Awakened in the operating room, was neurologically intact.  Transferred to  the recovery room in stable condition.      Balinda Quails, M.D.  Electronically Signed     PGH/MEDQ  D:  10/06/2005  T:  10/07/2005  Job:  [id number]

## 2010-07-25 ENCOUNTER — Encounter: Payer: Self-pay | Admitting: Family Medicine

## 2010-07-25 NOTE — Progress Notes (Signed)
  Subjective:    Patient ID: [name], female    DOB: 08/13/1950, 60 y.o.   MRN: [id number] This 60 year old white divorced female with hepatitis C polycythemia and coronary artery disease having received 2 stents 2010, is in for physical examination and review of problem as well as laboratory studies request refill of alprazolam patient is under care of Dr. Verdis Prime cardiologist, Dr.  Myna Hidalgo hematologist for polycythemia, as well as Dr. Ritta Slot for hepatitis c Patient relates she is doing very well however has gainedweight since she worked at home instead of going to [employer] express office Patient had EKG one week ago, had Pap by gynecologist and was normal HPI    Review of Systemssee history of present illness     Objective:   Physical ExamThe patient is a well-developed well-nourished white female who is in no distress her pleasant, work HEENT negative carotid pulses good thyroid normal Lungs clear to palpation percussion and auscultation no dullness no wheezing no rales Heart no evidence of cardiomegaly heart sounds regular without murmurs regular rhythm Breast full no masses no tenderness axilla clear nipples everted Abdomen liver spleen and kidneys are nonpalpable no masses felt bowel sounds normal Pelvic and rectal not done Extremities negative Spine negative Skin negative        Assessment & Plan:  I feel the patient is doing her well despite her multiple medical problems Hepatitis C under control to check on the treatment Polycythemia hematocrit 45.7 to send report to Dr Fonnie Mu Coronary artery disease control Anxiety and stress to be treated with alprazolam . 5 mg t.i.d. As needed Recommend weight reduction diet TSH elevated to treat with Synthroid 100 mcg q. Day to recheck in 2 months

## 2010-08-03 ENCOUNTER — Encounter (HOSPITAL_BASED_OUTPATIENT_CLINIC_OR_DEPARTMENT_OTHER): Payer: BC Managed Care – PPO | Admitting: Hematology & Oncology

## 2010-08-03 ENCOUNTER — Other Ambulatory Visit: Payer: Self-pay | Admitting: Hematology & Oncology

## 2010-08-03 DIAGNOSIS — D45 Polycythemia vera: Secondary | ICD-10-CM

## 2010-08-03 DIAGNOSIS — B1920 Unspecified viral hepatitis C without hepatic coma: Secondary | ICD-10-CM

## 2010-08-03 LAB — CBC WITH DIFFERENTIAL (CANCER CENTER ONLY)
BASO#: 0 10*3/uL (ref 0.0–0.2)
Eosinophils Absolute: 0.1 10*3/uL (ref 0.0–0.5)
HGB: 14.8 g/dL (ref 11.6–15.9)
MCH: 29.5 pg (ref 26.0–34.0)
MCV: 88 fL (ref 81–101)
MONO#: 0.8 10*3/uL (ref 0.1–0.9)
MONO%: 12 % (ref 0.0–13.0)
NEUT#: 3.1 10*3/uL (ref 1.5–6.5)
RBC: 5.01 10*6/uL (ref 3.70–5.32)

## 2010-08-03 LAB — RETICULOCYTES (CHCC)
RBC.: 5.11 MIL/uL (ref 3.87–5.11)
Retic Ct Pct: 1.2 % (ref 0.4–3.1)

## 2010-08-08 ENCOUNTER — Other Ambulatory Visit (INDEPENDENT_AMBULATORY_CARE_PROVIDER_SITE_OTHER): Payer: BC Managed Care – PPO

## 2010-08-08 DIAGNOSIS — E0590 Thyrotoxicosis, unspecified without thyrotoxic crisis or storm: Secondary | ICD-10-CM

## 2010-08-10 ENCOUNTER — Ambulatory Visit (INDEPENDENT_AMBULATORY_CARE_PROVIDER_SITE_OTHER): Payer: BC Managed Care – PPO | Admitting: Family Medicine

## 2010-08-10 ENCOUNTER — Encounter: Payer: Self-pay | Admitting: Family Medicine

## 2010-08-10 VITALS — BP 128/78 | HR 75 | Temp 97.5°F | Wt 151.0 lb

## 2010-08-10 DIAGNOSIS — R5383 Other fatigue: Secondary | ICD-10-CM

## 2010-08-10 DIAGNOSIS — K589 Irritable bowel syndrome without diarrhea: Secondary | ICD-10-CM

## 2010-08-10 DIAGNOSIS — R609 Edema, unspecified: Secondary | ICD-10-CM

## 2010-08-10 DIAGNOSIS — R5381 Other malaise: Secondary | ICD-10-CM

## 2010-08-10 DIAGNOSIS — N390 Urinary tract infection, site not specified: Secondary | ICD-10-CM

## 2010-08-10 DIAGNOSIS — B1920 Unspecified viral hepatitis C without hepatic coma: Secondary | ICD-10-CM

## 2010-08-10 LAB — POCT URINALYSIS DIPSTICK
Blood, UA: NEGATIVE
Glucose, UA: NEGATIVE
Spec Grav, UA: 1.02
Urobilinogen, UA: 0.2

## 2010-08-10 MED ORDER — BECLOMETHASONE DIPROPIONATE 80 MCG/ACT NA AERS: 2.0000 | INHALATION_SPRAY | Freq: Every day | NASAL | Status: DC

## 2010-08-10 MED ORDER — HYOSCYAMINE SULFATE ER 0.375 MG PO TB12: ORAL_TABLET | ORAL | Status: DC

## 2010-08-10 MED ORDER — ALPRAZOLAM 0.5 MG PO TABS: 0.5000 mg | ORAL_TABLET | Freq: Three times a day (TID) | ORAL | Status: DC | PRN

## 2010-08-10 NOTE — Patient Instructions (Addendum)
Stop synthroid for now You have irritable bowel syndrome , start  new medication  of hyoscyamine .375 mg in am and pm   Try to increase your exercise or walking Also try hard to cut down on calories and lose a few pounds For your nose use the nasal spray Qnasl 2 sprays each nostril once per day, use discount card Call me in 1 week Elease Hashimoto my nurse [phone] ext

## 2010-08-15 DIAGNOSIS — K589 Irritable bowel syndrome without diarrhea: Secondary | ICD-10-CM | POA: Insufficient documentation

## 2010-08-15 NOTE — Progress Notes (Signed)
  Subjective:    Patient ID: [name], female    DOB: [birth year], 60 y.o.   MRN: [id number] This 60 year old white single female who has hepatitis C, polycythemia vera hypertension coronary artery disease arthritis is in today complaining of headache which she doesn't usually have nasal congestion with mucus no energy nausea and hyperactive bowel loose stools she has had this for some time but has never discussed it she relates she had been more tired since taking Synthroid and she was previously and definitely relates this to Synthroid. Blood pressure is well controlled 128/78 she is recently had some: Casimiro Needle has been relieved with hydrochlorothiazide states has had some urinary urgency but urinalysis is negative HPI    Review of Systemssee history of present illness     Objective:   Physical Exam Patient is well-developed well-nourished attractive pleasant cooperative white female in no acute distress but is complaining of feeling badly HEENT reveals pale nasal boggy nasal mucosa with considerable drainage Thyroid non-palpable Lungs clear to palpation percussion and auscultation no dullness no rales no rhonchi Heart no change regular rhythm no murmurs Abdomen generalized moderate tenderness with hyperactive bowel sounds liver spleen and kidneys are nonpalpable no masses felt Trace pretibial edema       Assessment & Plan:  Irritable bowel syndrome treated decreased roughage, hyoscyamine .375 bid Since she relates being so tired had onset of treatment with Synthroid will discontinue Urinalysis negative no treatment needed Recommend increase physical activities since she does not exercise at all Acute rhinitis to treat with Q. nasa nose spray Continue other medication

## 2010-09-07 ENCOUNTER — Other Ambulatory Visit: Payer: Self-pay | Admitting: Hematology & Oncology

## 2010-09-07 ENCOUNTER — Encounter (HOSPITAL_BASED_OUTPATIENT_CLINIC_OR_DEPARTMENT_OTHER): Payer: BC Managed Care – PPO | Admitting: Hematology & Oncology

## 2010-09-07 DIAGNOSIS — R61 Generalized hyperhidrosis: Secondary | ICD-10-CM

## 2010-09-07 DIAGNOSIS — B1920 Unspecified viral hepatitis C without hepatic coma: Secondary | ICD-10-CM

## 2010-09-07 DIAGNOSIS — D45 Polycythemia vera: Secondary | ICD-10-CM

## 2010-09-07 LAB — CBC WITH DIFFERENTIAL (CANCER CENTER ONLY)
EOS%: 1.5 % (ref 0.0–7.0)
LYMPH%: 37.2 % (ref 14.0–48.0)
MCH: 30.8 pg (ref 26.0–34.0)
MCHC: 35.5 g/dL (ref 32.0–36.0)
MONO%: 9.8 % (ref 0.0–13.0)
NEUT#: 3.5 10*3/uL (ref 1.5–6.5)
Platelets: 208 10*3/uL (ref 145–400)
RBC: 5.13 10*6/uL (ref 3.70–5.32)
RDW: 12.8 % (ref 11.1–15.7)

## 2010-09-07 LAB — FERRITIN: Ferritin: 65 ng/mL (ref 10–291)

## 2010-09-09 ENCOUNTER — Other Ambulatory Visit: Payer: Self-pay | Admitting: Hematology & Oncology

## 2010-09-15 LAB — 5 HIAA, QUANTITATIVE, URINE, 24 HOUR
5-HIAA, 24 Hr Urine: 2.4 mg/24 h (ref <=6.0)
Volume, Urine-5HIAA: 1525 mL/24 h

## 2010-09-15 LAB — VMA, URINE, 24 HOUR
Vanillylmandelic Acid, (VMA): 2.4 mg/24 h (ref <=6.0)
Volume, Urine-VMAUR: 1525 mL

## 2010-10-07 ENCOUNTER — Other Ambulatory Visit: Payer: Self-pay | Admitting: Hematology & Oncology

## 2010-10-07 ENCOUNTER — Encounter (HOSPITAL_BASED_OUTPATIENT_CLINIC_OR_DEPARTMENT_OTHER): Payer: BC Managed Care – PPO | Admitting: Hematology & Oncology

## 2010-10-07 DIAGNOSIS — B1920 Unspecified viral hepatitis C without hepatic coma: Secondary | ICD-10-CM

## 2010-10-07 DIAGNOSIS — D45 Polycythemia vera: Secondary | ICD-10-CM

## 2010-10-07 LAB — COMPREHENSIVE METABOLIC PANEL
ALT: 45 U/L — ABNORMAL HIGH (ref 0–35)
AST: 56 U/L — ABNORMAL HIGH (ref 0–37)
Albumin: 4.2 g/dL (ref 3.5–5.2)
Calcium: 9.6 mg/dL (ref 8.4–10.5)
Chloride: 102 mEq/L (ref 96–112)
Potassium: 4.1 mEq/L (ref 3.5–5.3)
Sodium: 138 mEq/L (ref 135–145)
Total Protein: 6.8 g/dL (ref 6.0–8.3)

## 2010-10-07 LAB — CBC WITH DIFFERENTIAL (CANCER CENTER ONLY)
BASO%: 0.7 % (ref 0.0–2.0)
EOS%: 0.8 % (ref 0.0–7.0)
LYMPH#: 2.2 10*3/uL (ref 0.9–3.3)
MCHC: 36 g/dL (ref 32.0–36.0)
NEUT#: 3.3 10*3/uL (ref 1.5–6.5)
RDW: 12.9 % (ref 11.1–15.7)

## 2010-11-16 ENCOUNTER — Other Ambulatory Visit: Payer: Self-pay | Admitting: Hematology & Oncology

## 2010-11-16 ENCOUNTER — Encounter (HOSPITAL_BASED_OUTPATIENT_CLINIC_OR_DEPARTMENT_OTHER): Payer: BC Managed Care – PPO | Admitting: Hematology & Oncology

## 2010-11-16 DIAGNOSIS — B1920 Unspecified viral hepatitis C without hepatic coma: Secondary | ICD-10-CM

## 2010-11-16 DIAGNOSIS — Z7982 Long term (current) use of aspirin: Secondary | ICD-10-CM

## 2010-11-16 DIAGNOSIS — D45 Polycythemia vera: Secondary | ICD-10-CM

## 2010-11-16 LAB — CBC WITH DIFFERENTIAL (CANCER CENTER ONLY)
BASO#: 0 10*3/uL (ref 0.0–0.2)
BASO%: 0.3 % (ref 0.0–2.0)
EOS%: 1.4 % (ref 0.0–7.0)
HCT: 43.9 % (ref 34.8–46.6)
HGB: 15.3 g/dL (ref 11.6–15.9)
LYMPH#: 2.3 10*3/uL (ref 0.9–3.3)
MCHC: 34.9 g/dL (ref 32.0–36.0)
MONO#: 0.7 10*3/uL (ref 0.1–0.9)
NEUT#: 3.5 10*3/uL (ref 1.5–6.5)
NEUT%: 53.7 % (ref 39.6–80.0)
WBC: 6.6 10*3/uL (ref 3.9–10.0)

## 2010-12-21 ENCOUNTER — Other Ambulatory Visit: Payer: Self-pay | Admitting: Hematology & Oncology

## 2010-12-21 ENCOUNTER — Encounter (HOSPITAL_BASED_OUTPATIENT_CLINIC_OR_DEPARTMENT_OTHER): Payer: BC Managed Care – PPO | Admitting: Hematology & Oncology

## 2010-12-21 DIAGNOSIS — B1920 Unspecified viral hepatitis C without hepatic coma: Secondary | ICD-10-CM

## 2010-12-21 DIAGNOSIS — D45 Polycythemia vera: Secondary | ICD-10-CM

## 2010-12-21 LAB — CBC WITH DIFFERENTIAL (CANCER CENTER ONLY)
BASO#: 0 10*3/uL (ref 0.0–0.2)
Eosinophils Absolute: 0.1 10*3/uL (ref 0.0–0.5)
HGB: 15 g/dL (ref 11.6–15.9)
MCH: 31.1 pg (ref 26.0–34.0)
MONO#: 0.6 10*3/uL (ref 0.1–0.9)
NEUT#: 4 10*3/uL (ref 1.5–6.5)
Platelets: 218 10*3/uL (ref 145–400)
RBC: 4.82 10*6/uL (ref 3.70–5.32)
WBC: 6.9 10*3/uL (ref 3.9–10.0)

## 2010-12-21 LAB — LACTATE DEHYDROGENASE: LDH: 194 U/L (ref 94–250)

## 2010-12-21 LAB — IRON AND TIBC: TIBC: 422 ug/dL (ref 250–470)

## 2010-12-21 LAB — FERRITIN: Ferritin: 58 ng/mL (ref 10–291)

## 2011-01-19 ENCOUNTER — Other Ambulatory Visit: Payer: Self-pay

## 2011-01-19 NOTE — Telephone Encounter (Signed)
Agree.  She needs to see physician prior to new RX.

## 2011-01-19 NOTE — Telephone Encounter (Signed)
Pt walked in to office and requested to have Lexapro 10 mg qd and lotrisone cream called in to [facility] on [address].  Pt states she has not been on Lexapro for a couple years because she "took herself off of it".  Pt is aware that she needs to establish with a new pcp.  Pt states she will call [facility] to establish with new pcp.  Pt is aware that she may need to make an appt to get Lexapro rx.

## 2011-01-20 NOTE — Telephone Encounter (Signed)
Pt would like to have her chart ordered.  Pt states she needed the Lexapro because she is going to start Hepatitis C treatments and she cannot start them without being on an antidepressant.  Pt states she has not decided on a new pcp.  Pt encouraged to find a new pcp asap.

## 2011-01-24 ENCOUNTER — Encounter: Payer: Self-pay | Admitting: Family Medicine

## 2011-01-25 ENCOUNTER — Ambulatory Visit: Payer: BC Managed Care – PPO | Admitting: Family Medicine

## 2011-02-02 ENCOUNTER — Other Ambulatory Visit: Payer: Self-pay | Admitting: Family Medicine

## 2011-03-06 ENCOUNTER — Ambulatory Visit: Payer: BC Managed Care – PPO | Admitting: Hematology & Oncology

## 2011-03-06 ENCOUNTER — Other Ambulatory Visit: Payer: BC Managed Care – PPO | Admitting: Lab

## 2011-03-13 ENCOUNTER — Telehealth: Payer: Self-pay | Admitting: Hematology & Oncology

## 2011-03-13 ENCOUNTER — Ambulatory Visit: Payer: BC Managed Care – PPO | Admitting: Hematology & Oncology

## 2011-03-13 ENCOUNTER — Other Ambulatory Visit: Payer: BC Managed Care – PPO | Admitting: Lab

## 2011-03-13 NOTE — Telephone Encounter (Signed)
Pt called and cx apt due to being sick and resch for 04/13/11

## 2011-04-04 ENCOUNTER — Other Ambulatory Visit (HOSPITAL_BASED_OUTPATIENT_CLINIC_OR_DEPARTMENT_OTHER): Payer: 59 | Admitting: Lab

## 2011-04-04 ENCOUNTER — Ambulatory Visit (HOSPITAL_BASED_OUTPATIENT_CLINIC_OR_DEPARTMENT_OTHER): Payer: 59 | Admitting: Hematology & Oncology

## 2011-04-04 DIAGNOSIS — B1920 Unspecified viral hepatitis C without hepatic coma: Secondary | ICD-10-CM

## 2011-04-04 DIAGNOSIS — D45 Polycythemia vera: Secondary | ICD-10-CM

## 2011-04-04 DIAGNOSIS — B1710 Acute hepatitis C without hepatic coma: Secondary | ICD-10-CM

## 2011-04-04 LAB — COMPREHENSIVE METABOLIC PANEL
ALT: 51 U/L — ABNORMAL HIGH (ref 0–35)
AST: 54 U/L — ABNORMAL HIGH (ref 0–37)
Alkaline Phosphatase: 91 U/L (ref 39–117)
Sodium: 137 mEq/L (ref 135–145)
Total Bilirubin: 0.8 mg/dL (ref 0.3–1.2)
Total Protein: 6.8 g/dL (ref 6.0–8.3)

## 2011-04-04 LAB — AFP TUMOR MARKER: AFP-Tumor Marker: 20.9 ng/mL — ABNORMAL HIGH (ref 0.0–8.0)

## 2011-04-04 LAB — CBC WITH DIFFERENTIAL (CANCER CENTER ONLY)
BASO#: 0.1 10*3/uL (ref 0.0–0.2)
BASO%: 1 % (ref 0.0–2.0)
EOS%: 1.9 % (ref 0.0–7.0)
Eosinophils Absolute: 0.1 10*3/uL (ref 0.0–0.5)
HCT: 43.4 % (ref 34.8–46.6)
HGB: 14.7 g/dL (ref 11.6–15.9)
LYMPH#: 1.9 10*3/uL (ref 0.9–3.3)
MCHC: 33.9 g/dL (ref 32.0–36.0)
MONO#: 0.6 10*3/uL (ref 0.1–0.9)
MONO%: 9.6 % (ref 0.0–13.0)
NEUT%: 55.3 % (ref 39.6–80.0)
RBC: 4.83 10*6/uL (ref 3.70–5.32)
WBC: 5.9 10*3/uL (ref 3.9–10.0)

## 2011-04-04 NOTE — Progress Notes (Signed)
CC:   [name], M.D. [name], M.D. [name], M.D.  DIAGNOSES: 1. Polycythemia vera, JAK2 negative. 2. Hepatitis.  CURRENT THERAPY: 1. Phlebotomy to maintain a hematocrit less than 45%. 2. Aspirin 81 mg p.o. daily.  INTERIM HISTORY:  [name] is in for followup.  The patient is doing pretty well.  Her weight has not gone up.  I think we last saw her back in October.  Since then, she has been busy working.  She is not sure if she will be laid off from [employer].  This certainly is stressful for her.  Her iron studies still looked quite good.  She has not had any issues with respect to iron getting too high.  We are monitoring her iron fairly closely.  Her last ferritin was 58 in October.  We also are monitoring her alpha-fetoprotein giving her history of hepatitis C.  Her last alpha-fetoprotein was 19.5 in June.  She has had no rashes.  She has had no itching.  She has had no headache.  PHYSICAL EXAMINATION:  This is a well-developed, well-nourished white female in no obvious distress.  Vital signs:  Show a temperature of 97.1, pulse 58, respiratory rate 18, blood pressure 128/82.  Weight is 154.  Head and neck:  Shows a normocephalic, atraumatic skull.  There are no ocular or oral lesions.  There are no palpable cervical or supraclavicular lymph nodes.  Lungs:  Clear bilaterally.  Cardiac: Regular rate and rhythm with a normal S1 and S2.  There are no murmurs, rubs or bruits.  Abdomen:  Soft with good bowel sounds.  There is no palpable abdominal mass.  There is no palpable hepatosplenomegaly. Back:  No tenderness of the spine, ribs, or hips.  Extremities:  Show no clubbing, cyanosis or edema.  LABORATORY STUDIES:  White cell count is 5.9, hemoglobin 14.7 and 43.4, platelet count 207.  MCV is 90.  IMPRESSION:  [name] is a 61 year old white female with polycythemia vera.  This was not an issue for her.  We are phlebotomizing her  fairly aggressively.  Thankfully, we have not had to phlebotomize her for 1 year.  We will go ahead and plan to get her back in 3 months from now.  We will also be checking her alpha-fetoprotein when we see her back.    ______________________________ Josph Macho, M.D. PRE/MEDQ  D:  04/04/2011  T:  04/04/2011  Job:  [id number]

## 2011-04-04 NOTE — Progress Notes (Signed)
This office note has been dictated.

## 2011-04-05 ENCOUNTER — Telehealth: Payer: Self-pay | Admitting: Family Medicine

## 2011-04-05 ENCOUNTER — Telehealth: Payer: Self-pay | Admitting: Emergency Medicine

## 2011-04-05 ENCOUNTER — Telehealth: Payer: Self-pay | Admitting: Hematology & Oncology

## 2011-04-05 MED ORDER — ALPRAZOLAM 0.5 MG PO TABS: 0.5000 mg | ORAL_TABLET | Freq: Three times a day (TID) | ORAL | Status: DC | PRN

## 2011-04-05 MED ORDER — CLOTRIMAZOLE-BETAMETHASONE 1-0.05 % EX CREA: TOPICAL_CREAM | Freq: Two times a day (BID) | CUTANEOUS | Status: DC

## 2011-04-05 NOTE — Telephone Encounter (Signed)
Received call from [facility] with Dr. Gustavo Lah office with referral on Patty- she needs PCP.  Rick requested I call pt to schedule appt since she has strange schedule.  Called patient, left message on voicemail to return call to the office to schedule appointment

## 2011-04-05 NOTE — Telephone Encounter (Signed)
May refill one month only.

## 2011-04-05 NOTE — Telephone Encounter (Signed)
Pt called and is needing to get refill of ALPRAZolam (XANAX) 0.5 MG tablet and clotrimazole-betamethasone (LOTRISONE) cream to [facility] [address] [facility] day supply, until pt can est with another pcp.

## 2011-04-05 NOTE — Telephone Encounter (Signed)
[name] called back, spoke with [name].  She states that she was going to check to make sure [facility] is in her network.  She also needs an appointment sooner than what [facility] has available.  She will check and call back to schedule appointment

## 2011-04-05 NOTE — Telephone Encounter (Signed)
Pls advise.  

## 2011-04-05 NOTE — Telephone Encounter (Signed)
Debbie at Dr. Lonell Face office will call patient to schedule new patient appointment for PCP

## 2011-04-05 NOTE — Telephone Encounter (Signed)
[facility] called into [facility]

## 2011-04-10 ENCOUNTER — Encounter: Payer: Self-pay | Admitting: *Deleted

## 2011-04-10 NOTE — Progress Notes (Signed)
Left message on pt's home answering machine that her labs from 04/08/11 were stable.

## 2011-06-29 ENCOUNTER — Telehealth: Payer: Self-pay | Admitting: Family Medicine

## 2011-06-29 NOTE — Telephone Encounter (Signed)
Patient called stating that she need a refill on her xanax sent to [facility] [address] as she does not see her new physician until Jul 12, 2011. DR. [name] with [facility] at [place]. Please advise.

## 2011-06-30 ENCOUNTER — Other Ambulatory Visit: Payer: Self-pay | Admitting: *Deleted

## 2011-06-30 MED ORDER — ALPRAZOLAM 0.5 MG PO TABS: 0.5000 mg | ORAL_TABLET | Freq: Three times a day (TID) | ORAL | Status: DC | PRN

## 2011-06-30 NOTE — Telephone Encounter (Signed)
Please note MD Tabori instructions in prior note

## 2011-06-30 NOTE — Telephone Encounter (Signed)
Disregard previous message 

## 2011-06-30 NOTE — Telephone Encounter (Signed)
may refill for 2 week supply only

## 2011-06-30 NOTE — Telephone Encounter (Signed)
Pt last seen 08/10/10.  Pls advise.  

## 2011-06-30 NOTE — Telephone Encounter (Signed)
Addended by: Azucena Freed on: 06/30/2011 02:32 PM   Modules accepted: Orders

## 2011-06-30 NOTE — Telephone Encounter (Signed)
Pt stated she will not seen Dr Beverely Low. Pt has appt with a MD at [facility] on 07-12-2011. Pt would like a refill on xanax [facility] [address]. Pt was seeing Dr Scotty Court

## 2011-06-30 NOTE — Telephone Encounter (Signed)
Rx called in to pharmacy for 2 week supply.

## 2011-06-30 NOTE — Telephone Encounter (Signed)
I'm not sure what to do w/ this message- please contact the sender and ask what she wants [place] to do (particularly if pt doesn't want to see me)

## 2011-06-30 NOTE — Telephone Encounter (Signed)
This med was last filled on 04-05-11, #90 with 0 refills.  We received a faxed request and I faxed it back with a note to fax to Dr Beverely Low earlier today.

## 2011-07-04 ENCOUNTER — Other Ambulatory Visit: Payer: Self-pay | Admitting: Family Medicine

## 2011-07-04 NOTE — Telephone Encounter (Signed)
Noted pt Xanax request was filled by MD Burchette on 06-30-11

## 2011-07-07 ENCOUNTER — Other Ambulatory Visit (HOSPITAL_BASED_OUTPATIENT_CLINIC_OR_DEPARTMENT_OTHER): Payer: 59 | Admitting: Lab

## 2011-07-07 ENCOUNTER — Ambulatory Visit (HOSPITAL_BASED_OUTPATIENT_CLINIC_OR_DEPARTMENT_OTHER): Payer: 59 | Admitting: Hematology & Oncology

## 2011-07-07 DIAGNOSIS — B1710 Acute hepatitis C without hepatic coma: Secondary | ICD-10-CM

## 2011-07-07 DIAGNOSIS — D45 Polycythemia vera: Secondary | ICD-10-CM

## 2011-07-07 LAB — CBC WITH DIFFERENTIAL (CANCER CENTER ONLY)
BASO#: 0 10*3/uL (ref 0.0–0.2)
BASO%: 0.3 % (ref 0.0–2.0)
EOS%: 1.7 % (ref 0.0–7.0)
HGB: 14.8 g/dL (ref 11.6–15.9)
MCH: 30.3 pg (ref 26.0–34.0)
MCHC: 33.6 g/dL (ref 32.0–36.0)
MONO%: 9.1 % (ref 0.0–13.0)
NEUT#: 3.5 10*3/uL (ref 1.5–6.5)
NEUT%: 55.2 % (ref 39.6–80.0)
RDW: 12.3 % (ref 11.1–15.7)

## 2011-07-07 LAB — COMPREHENSIVE METABOLIC PANEL
ALT: 43 U/L — ABNORMAL HIGH (ref 0–35)
AST: 61 U/L — ABNORMAL HIGH (ref 0–37)
Albumin: 3.7 g/dL (ref 3.5–5.2)
Alkaline Phosphatase: 88 U/L (ref 39–117)
BUN: 18 mg/dL (ref 6–23)
Chloride: 99 mEq/L (ref 96–112)
Potassium: 3.9 mEq/L (ref 3.5–5.3)
Sodium: 136 mEq/L (ref 135–145)

## 2011-07-07 LAB — AFP TUMOR MARKER: AFP-Tumor Marker: 19.7 ng/mL — ABNORMAL HIGH (ref 0.0–8.0)

## 2011-07-07 LAB — IRON AND TIBC: Iron: 118 ug/dL (ref 42–145)

## 2011-07-07 NOTE — Progress Notes (Signed)
This office note has been dictated.

## 2011-07-08 NOTE — Progress Notes (Signed)
CC:   Petra Kuba, M.D. Lyn Records, M.D. Kendrick Ranch, M.D.  DIAGNOSES: 1. Polycythemia vera, JAK2 negative. 2. Hepatitis C.  CURRENT THERAPY: 1. Phlebotomy to maintain hematocrit less than 45%. 2. Aspirin 81 mg p.o. daily.  INTERIM HISTORY:  Ms. Lacorte comes in for followup.  She is doing well. She still does not like working at home.  She says she keeps gaining weight.  She really needs to exercise more.  She has had no problems with nausea, vomiting.  There has been no fevers, sweats or chills.  She really wants to see a hepatologist to help with her hepatitis.  I recommended that she see Dr. Ewing Schlein.  Will see about making an appointment for her to see Dr. Ewing Schlein.  She has had no leg swelling.  There has been no bleeding.  There has been no rashes.  She has had no headache.  We are checking her alpha-fetoprotein every 6 months so.  Her last alpha fetoprotein was 20.9, which is holding steady.  PHYSICAL EXAMINATION:  This is a well-developed, well-nourished white female in no obvious distress.  Vital signs: 97.3, pulse 64, respiratory rate 18, blood pressure 122/72.  Weight is 157.  Head and neck: Normocephalic, atraumatic skull.  There are no ocular or oral lesions. There are no palpable cervical or supraclavicular lymph nodes.  Lungs: Clear bilaterally.  Cardiac:  Regular rate and rhythm with a normal S1 and S2.  There are no murmurs, rubs or bruits.  Abdomen:  Soft with good bowel sounds.  There is no palpable abdominal mass.  There is no fluid wave.  There is no palpable hepatosplenomegaly.  Back:  No tenderness over the spine, ribs, or hips.  Extremities: No clubbing, cyanosis or edema.  Neurologic:  No focal neurological deficits.  LABORATORY STUDIES:  White cell count is 6.4, hemoglobin 14.8, hematocrit 44.1, platelet count 201.  Her iron saturation is 30%.  Her liver function tests are normal.  IMPRESSION:  Ms. Stamp is a 61 year old white  female with polycythemia.  She has had no complications from this.  She has done very well.  I think that we can hold off on any phlebotomy for right now.  Again will see about referring her to Dr. Ewing Schlein.  Personally, I do not see that she has any issues with her hepatitis C at this point in time.  I will plan see back myself in another 2 months or so.    ______________________________ Josph Macho, M.D. PRE/MEDQ  D:  07/07/2011  T:  07/08/2011  Job:  [id number]

## 2011-07-26 ENCOUNTER — Telehealth: Payer: Self-pay | Admitting: Hematology & Oncology

## 2011-07-26 NOTE — Telephone Encounter (Signed)
Talked with Ardine Bjork at Dr. Marlane Hatcher office scheduled 08-21-11 appointment at 1145 am. Left pt message with address and phone number and to bring drivers licence, insurance card and meds to appointment along with time and date.

## 2011-08-15 ENCOUNTER — Encounter: Payer: Self-pay | Admitting: *Deleted

## 2011-08-15 NOTE — Progress Notes (Signed)
Received call from Dr. Precious Bard office.  States they do not see Hep C patients unless they have another GI dx.  Said there was a Hep C Clinic at [facility], [address].  Did not know the doctors names. Dr. Myna Hidalgo made aware. Called pt with this information and she states she has been there before and was not happy with them.  Pt.  Will check with [facility] to see if they have a hepatologist.

## 2011-09-21 ENCOUNTER — Ambulatory Visit (HOSPITAL_BASED_OUTPATIENT_CLINIC_OR_DEPARTMENT_OTHER): Payer: 59 | Admitting: Hematology & Oncology

## 2011-09-21 ENCOUNTER — Other Ambulatory Visit (HOSPITAL_BASED_OUTPATIENT_CLINIC_OR_DEPARTMENT_OTHER): Payer: 59 | Admitting: Lab

## 2011-09-21 VITALS — BP 118/73 | HR 66 | Temp 97.1°F | Wt 157.0 lb

## 2011-09-21 DIAGNOSIS — D45 Polycythemia vera: Secondary | ICD-10-CM

## 2011-09-21 DIAGNOSIS — B1710 Acute hepatitis C without hepatic coma: Secondary | ICD-10-CM

## 2011-09-21 DIAGNOSIS — B1920 Unspecified viral hepatitis C without hepatic coma: Secondary | ICD-10-CM

## 2011-09-21 LAB — BASIC METABOLIC PANEL - CANCER CENTER ONLY
Calcium: 8.9 mg/dL (ref 8.0–10.3)
Glucose, Bld: 132 mg/dL — ABNORMAL HIGH (ref 73–118)
Sodium: 140 mEq/L (ref 128–145)

## 2011-09-21 LAB — CBC WITH DIFFERENTIAL (CANCER CENTER ONLY)
BASO%: 0.5 % (ref 0.0–2.0)
EOS%: 1.5 % (ref 0.0–7.0)
HCT: 43.4 % (ref 34.8–46.6)
LYMPH#: 2.1 10*3/uL (ref 0.9–3.3)
MCH: 30.5 pg (ref 26.0–34.0)
MCHC: 33.9 g/dL (ref 32.0–36.0)
MONO%: 9.5 % (ref 0.0–13.0)
NEUT%: 49.6 % (ref 39.6–80.0)
RDW: 12.8 % (ref 11.1–15.7)

## 2011-09-21 NOTE — Progress Notes (Signed)
This office note has been dictated.

## 2011-09-22 NOTE — Progress Notes (Signed)
CC:   Amber Showers, MD [name], M.D.  DIAGNOSES: 1. Polycythemia vera, JAK2 negative. 2. Hepatitis C.  CURRENT THERAPY: 1. Phlebotomy to maintain hematocrit less than 45%. 2. Aspirin 81 mg p.o. daily.  INTERIM HISTORY:  [name] comes in for her followup.  We last saw her back in May.  She is doing okay.  She now sees Dr. Elby [name]. She said that Dr. [name] took her off 1 of her diuretics.  She said that her sodium was 121.  This is a little unusual.  We have not had to phlebotomize [name] for quite a while.  I think it has probably been about a year since she has been phlebotomized.  Her last ferritin back in May was 55.  We did check her alpha-fetoprotein.  It was 19.7 back in May, which is holding steady.  She has had no "flare-ups" of her hepatitis C.  There has been no jaundice.  She has had no leg swelling.  She has had no fevers.  PHYSICAL EXAMINATION:  General:  This is a well-developed, well- nourished white female in no obvious distress.  Vital Signs: Temperature 97.1, pulse 66, respiratory rate 16, blood pressure 118/73, weight is 157.  Head and Neck Exam:  Shows a normocephalic, atraumatic skull.  There are no ocular or oral lesions.  There are no palpable cervical or supraclavicular lymph nodes.  Lungs:  Clear bilaterally. Cardiac Exam:  Regular rate and rhythm with a normal S1 and S2.  There are no murmurs, rubs, or bruits.  Abdominal Exam:  Soft with good bowel sounds.  There is no palpable abdominal mass.  There is no fluid wave. There is no palpable hepatosplenomegaly.  Extremities:  Show some trace edema in her legs.  She has good range of motion of her joints.  Skin: No rashes, ecchymosis, or petechia.  LABORATORY STUDIES:  White cell count is 5.5, hemoglobin 14.7, hematocrit 43.4, platelet count 203.  Her sodium is 140.  IMPRESSION:  [name] is a 61 year old white female with polycythemia.  Again, she is very stable with this.   She has not been phlebotomized for about a year.  She does not need to be phlebotomized today.  I am not sure where this sodium of 121 had come from in the past.  When we last saw her in May, her sodium was normal at 136.  I am glad to see that her sodium was normal today.  I did send a copy of the metabolic panel over to Dr. [name].  We will have [name] come back to see [place] in another 3 months.  We will certainly maintain an aggressive posture with phlebotomizing her if her hemoglobin or hematocrit get above the cut-off value.    ______________________________ [name], M.D. PRE/MEDQ  D:  09/21/2011  T:  09/22/2011  Job:  [id number]

## 2011-10-30 ENCOUNTER — Observation Stay (HOSPITAL_COMMUNITY)
Admission: EM | Admit: 2011-10-30 | Discharge: 2011-10-31 | Disposition: A | Discharge status: Home / Self Care | Payer: 59 | Attending: Family Medicine | Admitting: Family Medicine

## 2011-10-30 ENCOUNTER — Encounter (HOSPITAL_COMMUNITY): Payer: Self-pay | Admitting: Emergency Medicine

## 2011-10-30 DIAGNOSIS — K921 Melena: Principal | ICD-10-CM

## 2011-10-30 DIAGNOSIS — Z8719 Personal history of other diseases of the digestive system: Secondary | ICD-10-CM

## 2011-10-30 DIAGNOSIS — F17200 Nicotine dependence, unspecified, uncomplicated: Secondary | ICD-10-CM | POA: Insufficient documentation

## 2011-10-30 DIAGNOSIS — I2510 Atherosclerotic heart disease of native coronary artery without angina pectoris: Secondary | ICD-10-CM | POA: Insufficient documentation

## 2011-10-30 DIAGNOSIS — I25119 Atherosclerotic heart disease of native coronary artery with unspecified angina pectoris: Secondary | ICD-10-CM | POA: Diagnosis present

## 2011-10-30 DIAGNOSIS — B1920 Unspecified viral hepatitis C without hepatic coma: Secondary | ICD-10-CM | POA: Insufficient documentation

## 2011-10-30 DIAGNOSIS — Z79899 Other long term (current) drug therapy: Secondary | ICD-10-CM | POA: Insufficient documentation

## 2011-10-30 DIAGNOSIS — K219 Gastro-esophageal reflux disease without esophagitis: Secondary | ICD-10-CM | POA: Insufficient documentation

## 2011-10-30 DIAGNOSIS — B1710 Acute hepatitis C without hepatic coma: Secondary | ICD-10-CM

## 2011-10-30 DIAGNOSIS — D751 Secondary polycythemia: Secondary | ICD-10-CM | POA: Insufficient documentation

## 2011-10-30 DIAGNOSIS — K589 Irritable bowel syndrome without diarrhea: Secondary | ICD-10-CM | POA: Diagnosis present

## 2011-10-30 DIAGNOSIS — K5730 Diverticulosis of large intestine without perforation or abscess without bleeding: Secondary | ICD-10-CM | POA: Insufficient documentation

## 2011-10-30 DIAGNOSIS — I10 Essential (primary) hypertension: Secondary | ICD-10-CM | POA: Diagnosis present

## 2011-10-30 DIAGNOSIS — D45 Polycythemia vera: Secondary | ICD-10-CM | POA: Insufficient documentation

## 2011-10-30 LAB — COMPREHENSIVE METABOLIC PANEL
ALT: 29 U/L (ref 0–35)
AST: 52 U/L — ABNORMAL HIGH (ref 0–37)
Calcium: 9.4 mg/dL (ref 8.4–10.5)
Creatinine, Ser: 0.69 mg/dL (ref 0.50–1.10)
GFR calc Af Amer: >90 mL/min (ref >90)
Glucose, Bld: 100 mg/dL — ABNORMAL HIGH (ref 70–99)
Sodium: 138 mEq/L (ref 135–145)
Total Protein: 6.9 g/dL (ref 6.0–8.3)

## 2011-10-30 LAB — TYPE AND SCREEN: ABO/RH(D): O POS

## 2011-10-30 LAB — CBC
HCT: 47.8 % — ABNORMAL HIGH (ref 36.0–46.0)
MCH: 30.8 pg (ref 26.0–34.0)
MCH: 30.3 pg (ref 26.0–34.0)
MCHC: 34.2 g/dL (ref 30.0–36.0)
MCHC: 33.3 g/dL (ref 30.0–36.0)
MCV: 90.2 fL (ref 78.0–100.0)
MCV: 91 fL (ref 78.0–100.0)
Platelets: 212 10*3/uL (ref 150–400)
RDW: 12.4 % (ref 11.5–15.5)

## 2011-10-30 LAB — PROTIME-INR: INR: 0.93 (ref 0.00–1.49)

## 2011-10-30 LAB — APTT: aPTT: 31 seconds (ref 24–37)

## 2011-10-30 LAB — ABO/RH: ABO/RH(D): O POS

## 2011-10-30 MED ORDER — ONDANSETRON HCL 4 MG PO TABS: 4.0000 mg | ORAL_TABLET | Freq: Four times a day (QID) | ORAL | Status: DC | PRN

## 2011-10-30 MED ORDER — ESCITALOPRAM OXALATE 10 MG PO TABS
10.0000 mg | ORAL_TABLET | Freq: Every day | ORAL | Status: DC
  Administered 2011-10-31: 10 mg via ORAL
  Filled 2011-10-30: qty 1

## 2011-10-30 MED ORDER — SODIUM CHLORIDE 0.9 % IV SOLN
INTRAVENOUS | Status: DC
  Administered 2011-10-30: 150 mL/h via INTRAVENOUS
  Administered 2011-10-31 (×2): via INTRAVENOUS

## 2011-10-30 MED ORDER — ACETAMINOPHEN 650 MG RE SUPP: 650.0000 mg | Freq: Four times a day (QID) | RECTAL | Status: DC | PRN

## 2011-10-30 MED ORDER — METOPROLOL SUCCINATE ER 50 MG PO TB24
150.0000 mg | ORAL_TABLET | Freq: Every day | ORAL | Status: DC
  Administered 2011-10-31: 150 mg via ORAL
  Filled 2011-10-30: qty 1

## 2011-10-30 MED ORDER — ONDANSETRON HCL 4 MG/2ML IJ SOLN: 4.0000 mg | Freq: Four times a day (QID) | INTRAMUSCULAR | Status: DC | PRN

## 2011-10-30 MED ORDER — ACETAMINOPHEN 325 MG PO TABS: 650.0000 mg | ORAL_TABLET | Freq: Four times a day (QID) | ORAL | Status: DC | PRN

## 2011-10-30 MED ORDER — HYDROCODONE-ACETAMINOPHEN 5-325 MG PO TABS: 1.0000 | ORAL_TABLET | ORAL | Status: DC | PRN

## 2011-10-30 MED ORDER — ALPRAZOLAM 0.5 MG PO TABS: 0.5000 mg | ORAL_TABLET | Freq: Three times a day (TID) | ORAL | Status: DC | PRN

## 2011-10-30 MED ORDER — ONDANSETRON HCL 4 MG/2ML IJ SOLN: 4.0000 mg | Freq: Three times a day (TID) | INTRAMUSCULAR | Status: DC | PRN

## 2011-10-30 MED ORDER — HYDROCHLOROTHIAZIDE 12.5 MG PO CAPS
12.5000 mg | ORAL_CAPSULE | Freq: Every day | ORAL | Status: DC
  Administered 2011-10-31: 12.5 mg via ORAL
  Filled 2011-10-30: qty 1

## 2011-10-30 NOTE — H&P (Signed)
History and Physical  [name]:[id number] DOB: 12/20/1950 DOA: 10/30/2011  Referring physician: ED PCP: Elby Showers, MD  GI: Lorra Hals, MD Hematologist: Arlan Organ, MD Cardiologist: Verdis Prime, MD  Chief Complaint: bleeding  HPI:  61 year old woman presented to ED with history of bleeding. Last night after dinner patient had immediate cramping and developed several episodes of diarrhea, which recurred throughout the evening. Prior to going to bed she noted a small amount of blood in the commode and blood on tissue paper. During the night she developed frank blood with diarrhea, several episodes, the last one being prior to presenting to ED. Since being in the ED she has had no pain or bleeding. She denies nausea, vomiting.  She has a history of diverticulosis and is closely followed by Dr. Kinnie Scales with colonoscopy every 3 years for polyps. She has never had bleeding before.  No EDP documenation for available for review.  In ED noted to be afebrile with stable vitals. Hemoglobin 16.3, PT/INR/PTT normal. EKG independent review--SR, no acute changes. EDP apparently spoke with Dr. Kinnie Scales who recommended admission overnight.  Review of Systems:  Negative for fever, visual changes, sore throat, rash, new muscle aches, chest pain, SOB, dysuria, n/v. Otherwise as above.  Past Medical History  Diagnosis Date  . Hepatitis C   . Hypertension   . Polycythemia 2009  . CAD (coronary artery disease)   . Arthritis   . GERD (gastroesophageal reflux disease)     Past Surgical History  Procedure Date  . 2 stints Aug 26,2010    Dr. Rudean Haskell  . Cartoid endartherectomy 2007  . Coronary angioplasty with stent placement     Social History:  reports that she has been smoking.  She has never used smokeless tobacco. She reports that she does not drink alcohol or use illicit drugs.  Allergies  Allergen Reactions  . Diphenhydramine Hcl Hives    History reviewed. No pertinent  family history.   Prior to Admission medications   Medication Sig Start Date End Date Taking? Authorizing Provider  ALPRAZolam Prudy Feeler) 0.5 MG tablet Take 0.5 mg by mouth 3 (three) times daily as needed. 06/30/11 12/29/12 Yes Kristian Covey, MD  aspirin 325 MG EC tablet Take 325 mg by mouth daily.   Yes Historical Provider, MD  Calcium Carbonate-Vitamin D (CALCIUM 600/VITAMIN D) 600-400 MG-UNIT per tablet Take 1 tablet by mouth daily.     Yes Historical Provider, MD  clotrimazole-betamethasone (LOTRISONE) cream Apply 1 application topically 2 (two) times daily. 04/05/11  Yes Gordy Savers, MD  escitalopram (LEXAPRO) 10 MG tablet Take 10 mg by mouth daily.   Yes Historical Provider, MD  hydrochlorothiazide (,MICROZIDE/HYDRODIURIL,) 12.5 MG capsule 12.5 mg.  06/26/10  Yes Historical Provider, MD  metoprolol (TOPROL-XL) 100 MG 24 hr tablet 150 mg daily.  05/30/10  Yes Historical Provider, MD  Multiple Vitamins-Minerals (MULTIVITAMIN PO) Take by mouth every morning.   Yes Historical Provider, MD  omeprazole (PRILOSEC) 20 MG capsule Take 20 mg by mouth daily.   Yes Historical Provider, MD  VITAMIN E PO Take 400 mg by mouth daily.    Yes Historical Provider, MD   Physical Exam: Filed Vitals:   10/30/11 1334 10/30/11 1626 10/30/11 1629 10/30/11 1630  BP: 120/60 121/76 128/71 132/60  Pulse: 66 57 62 61  Temp:      TempSrc:      Resp:      SpO2:        General:  Appears calm and comfortable.  Examined in ED. Well-appearing.  Eyes: PERRL, normal lids, irises & conjunctiva  ENT: grossly normal hearing, lips & tongue  Neck: no LAD, masses or thyromegaly  Cardiovascular: RRR, no m/r/g. No LE edema.  Respiratory: CTA bilaterally, no w/r/r. Normal respiratory effort.  Abdomen: soft, nt. Mild distension.  Skin: no rash or induration seen on limited exam  Musculoskeletal: grossly normal tone BUE/BLE  Psychiatric: grossly normal mood and affect, speech fluent and appropriate  Neurologic:  grossly non-focal.  Labs on Admission:  Basic Metabolic Panel:  Lab 10/30/11 6578  NA 138  K 4.6  CL 100  CO2 30  GLUCOSE 100*  BUN 12  CREATININE 0.69  CALCIUM 9.4  MG --  PHOS --    Liver Function Tests:  Lab 10/30/11 1515  AST 52*  ALT 29  ALKPHOS 92  BILITOT 0.7  PROT 6.9  ALBUMIN 3.5   CBC:  Lab 10/30/11 1515  WBC 8.1  NEUTROABS --  HGB 16.3*  HCT 47.7*  MCV 90.2  PLT 212   Radiological Exams on Admission: No results found.  Principal Problem:  *Hematochezia Active Problems:  HEPATITIS C  POLYCYTHEMIA  Irritable bowel syndrome  Hypertension  CAD (coronary artery disease)   Assessment/Plan 1. Hematochezia--presumed diverticular based on history and known diverticulosis. No evidence of infection. Hemoglobin consistent with history of polycythemia and mild dehydration. Hemodynamically stable. Serial CBC, monitor for bleeding. If hemorrhage consider bleeding scan and emergent GI consultation otherwise consult with Dr. Kinnie Scales in the AM. 2. CAD--stable. Hold ASA. 3. Hepatitis C 4. Polycythemia--stable. 5. Smoker--recommend cessation.  Code Status: full code Family Communication: discussed with son, nephew, family at bedside Disposition Plan: home when stable, anticipate 24-48 hour hospital stay  Time spent: 22 minutes  [name], MD  [facility] Team 5 Pager [phone]. If 7PM-7AM, please contact night-coverage at [email or web address], password [email or web address] 10/30/2011, 5:31 PM

## 2011-10-30 NOTE — ED Notes (Signed)
Report called to Merla Riches RN

## 2011-10-30 NOTE — Progress Notes (Signed)
[name], is a 61 y.o. female,   MRN: [id number]  -  DOB - Apr 21, 1950  Outpatient Primary MD for the patient is Neena Rhymes, MD  in for    Chief Complaint  Patient presents with  . Rectal Bleeding     Blood pressure 132/60, pulse 61, temperature 98.7 F (37.1 C), temperature source Oral, resp. rate 20, SpO2 96.00%.  Principal Problem:  *Hematochezia Active Problems:  HEPATITIS C  POLYCYTHEMIA  Irritable bowel syndrome  Hypertension  CAD (coronary artery disease)   61 yo hx as above presents to [facility] cc abdominal pain/nausea without vomiting. Also reports frequent stool with bright red blood as well as dk blood. Denies dark stool. Total of 6 episodes yesterday. Lower quadrant cramping.   In ED Hg 16.3 (has polycythemia) , no further episodes. Dr. Kinnie Scales consulted by ED MD. Dr. Kinnie Scales recommends admitting for obs and serial CBC and if needed he can be consulted in am. If not, can arrange OP follow up    On exam, VSS, symptoms improved. Pt requesting food. Will admit for observation.

## 2011-10-30 NOTE — ED Notes (Signed)
Pt presenting to ed with c/o abdominal pain with positive nausea no vomiting pt states positive bright and dark red blood in her stools since last pm. Pt denies dizziness and lightheadedness at this time

## 2011-10-31 DIAGNOSIS — Z8719 Personal history of other diseases of the digestive system: Secondary | ICD-10-CM

## 2011-10-31 LAB — BASIC METABOLIC PANEL
BUN: 8 mg/dL (ref 6–23)
CO2: 28 mEq/L (ref 19–32)
Chloride: 103 mEq/L (ref 96–112)
Creatinine, Ser: 0.68 mg/dL (ref 0.50–1.10)
Glucose, Bld: 91 mg/dL (ref 70–99)

## 2011-10-31 LAB — CBC
Hemoglobin: 14.5 g/dL (ref 12.0–15.0)
MCH: 30.7 pg (ref 26.0–34.0)
MCHC: 33.7 g/dL (ref 30.0–36.0)
Platelets: 168 10*3/uL (ref 150–400)

## 2011-10-31 MED ORDER — POTASSIUM CHLORIDE CRYS ER 20 MEQ PO TBCR
20.0000 meq | EXTENDED_RELEASE_TABLET | Freq: Once | ORAL | Status: AC
  Administered 2011-10-31: 20 meq via ORAL
  Filled 2011-10-31: qty 1

## 2011-10-31 NOTE — Discharge Summary (Signed)
Physician Discharge Summary  [name] RUE:[id number] DOB: 10-23-1950 DOA: 10/30/2011  PCP: Elby Showers, MD GI: Stephens Shire, MD  Admit date: 10/30/2011 Discharge date: 10/31/2011  Recommendations for Outpatient Follow-up:  1. Follow-up GIB, presumed diverticular 2. Restart aspirin when prudent--deferred to Dr. Kinnie Scales. 3. Encourage smoking cessation  Follow-up Information    Follow up with [facility], MD in 2 weeks.   Contact information:   [address] [place] [address] [place] [address] [phone]       Follow up with MEDOFF,JEFFREY R, MD on 11/03/2011. (11:00)    Contact information:   [facility] [address] [place] [address] [phone]         Discharge Diagnoses:  1. Hematochezia 2. History of CAD 3. Smoker  Discharge Condition: improved Disposition: home   Diet recommendation: heart healthy, avoid seeds, nuts, popcorn  Filed Weights   10/30/11 2050  Weight: 69.4 kg (153 lb)    History of present illness:  61 year old woman presented to ED with history of bleeding. Last night after dinner patient had immediate cramping and developed several episodes of diarrhea, which recurred throughout the evening. Prior to going to bed she noted a small amount of blood in the commode and blood on tissue paper. During the night she developed frank blood with diarrhea, several episodes, the last one being prior to presenting to ED. Since being in the ED she has had no pain or bleeding. She denies nausea, vomiting. She has a history of diverticulosis and is closely followed by Dr. Kinnie Scales with colonoscopy every 3 years for polyps. She has never had bleeding before.  Hospital Course:  [name] was admitted for observation for GIB. She has had no further bleeding of note and hemoglobin remains within normal limits. Etiology is presumed diverticular in nature based on history. She feels well, wants to eat, and go home. Discussed with Dr.  Kinnie Scales who concurs and wants to see in office 9/12. 1. Hematochezia--Resolved. Hemoglobin stable. Presumed diverticular based on history and known diverticulosis. No evidence of infection. History of colonoscopy Q3 years by Dr. Kinnie Scales. Defer any further evaluation to outpatient setting. 2. CAD--stable. Hold ASA--resume when able, defered to Dr. Kinnie Scales.  3. Hepatitis C  4. Polycythemia--stable.  5. Smoker--recommend cessation.  Consultants:  none  Procedures:  none  Antibiotics:  none  Discharge Instructions  Discharge Orders    Future Appointments: Provider: Department: Dept Phone: Center:   11/27/2011 11:45 AM Rachael Fee [facility] [phone] None   11/27/2011 12:15 PM Josph Macho, MD [facility] [phone] None     Future Orders Please Complete By Expires   Diet - low sodium heart healthy      Increase activity slowly      Discharge instructions      Comments:   Advance diet as tolerated. Be sure to follow-up with Dr. Kinnie Scales as directed. Call physician or seek medical attention for bleeding or abdominal pain. Recommend avoiding nuts, seeds, popcorn.     Medication List  As of 10/31/2011  1:28 PM   STOP taking these medications         aspirin 325 MG EC tablet         TAKE these medications         ALPRAZolam 0.5 MG tablet   Commonly known as: XANAX   Take 0.5 mg by mouth 3 (three) times daily as needed.      CALCIUM 600/VITAMIN D 600-400 MG-UNIT per tablet   Generic  drug: Calcium Carbonate-Vitamin D   Take 1 tablet by mouth daily.      clotrimazole-betamethasone cream   Commonly known as: LOTRISONE   Apply 1 application topically 2 (two) times daily.      escitalopram 10 MG tablet   Commonly known as: LEXAPRO   Take 10 mg by mouth daily.      hydrochlorothiazide 12.5 MG capsule   Commonly known as: MICROZIDE   12.5 mg.      metoprolol succinate 100 MG 24 hr tablet   Commonly known as: TOPROL-XL   150 mg daily.       MULTIVITAMIN PO   Take by mouth every morning.      omeprazole 20 MG capsule   Commonly known as: PRILOSEC   Take 20 mg by mouth daily.      VITAMIN E PO   Take 400 mg by mouth daily.           The results of significant diagnostics from this hospitalization (including imaging, microbiology, ancillary and laboratory) are listed below for reference.    Significant Diagnostic Studies: No results found.  Labs: Basic Metabolic Panel:  Lab 10/31/11 5784 10/30/11 1515  NA 139 138  K 3.2* 4.6  CL 103 100  CO2 28 30  GLUCOSE 91 100*  BUN 8 12  CREATININE 0.68 0.69  CALCIUM 8.3* 9.4  MG -- --  PHOS -- --   Liver Function Tests:  Lab 10/30/11 1515  AST 52*  ALT 29  ALKPHOS 92  BILITOT 0.7  PROT 6.9  ALBUMIN 3.5   CBC:  Lab 10/31/11 0300 10/30/11 2057 10/30/11 1515  WBC 7.1 8.7 8.1  NEUTROABS -- -- --  HGB 14.5 15.9* 16.3*  HCT 43.0 47.8* 47.7*  MCV 90.9 91.0 90.2  PLT 168 203 212   Principal Problem:  *Hematochezia Active Problems:  HEPATITIS C  POLYCYTHEMIA  Irritable bowel syndrome  Hypertension  CAD (coronary artery disease)   Time coordinating discharge: 35 minutes  Signed:  [name], MD [facility] 10/31/2011, 1:28 PM

## 2011-10-31 NOTE — Progress Notes (Signed)
[facility] PROGRESS NOTE  [name] ZOX:[id number] DOB: 01-24-1951 DOA: 10/30/2011 PCP: Elby Showers, MD  Assessment/Plan: 1. Hematochezia--Resolved. Hemoglobin stable. Presumed diverticular based on history and known diverticulosis. No evidence of infection.  2. CAD--stable. Hold ASA--resume when able, defered to Dr. Kinnie Scales. 3. Hepatitis C 4. Polycythemia--stable. 5. Smoker--recommend cessation.  Code Status: full code  Family Communication: discussed with mother, sister at bedside Disposition Plan: home    Amber Sacks, MD  [facility] Team 5 Pager [phone]. If 7PM-7AM, please contact night-coverage at [email or web address], password [facility] 10/31/2011, 1:18 PM  LOS: 1 day   Brief narrative: 61 year old woman presented to ED with history of bleeding. Last night after dinner patient had immediate cramping and developed several episodes of diarrhea, which recurred throughout the evening. Prior to going to bed she noted a small amount of blood in the commode and blood on tissue paper. During the night she developed frank blood with diarrhea, several episodes, the last one being prior to presenting to ED. Since being in the ED she has had no pain or bleeding. She denies nausea, vomiting.  She has a history of diverticulosis and is closely followed by Dr. Kinnie Scales with colonoscopy every 3 years for polyps. She has never had bleeding before. Consultants:  none  Procedures:  none  Antibiotics:  none  HPI/Subjective: Feels better, some cramping at times, but no bleeding. Hungry--"I'm starved."   Objective: Filed Vitals:   10/30/11 1700 10/30/11 2050 10/31/11 0524 10/31/11 0927  BP: 136/84 142/69 101/67   Pulse: 50 57 56 55  Temp: 98.1 F (36.7 C) 98.2 F (36.8 C) 97.7 F (36.5 C)   TempSrc: Oral Oral Oral   Resp:  18 17   Height:  5\' 1"  (1.549 m)    Weight:  69.4 kg (153 lb)    SpO2: 95% 96% 95%     Intake/Output Summary (Last 24 hours) at 10/31/11  1318 Last data filed at 10/31/11 1046  Gross per 24 hour  Intake 1372.5 ml  Output      0 ml  Net 1372.5 ml   Filed Weights   10/30/11 2050  Weight: 69.4 kg (153 lb)    Exam:   General:  Appears calm and comfortable. Looking at menu for lunch as I enter  Cardiovascular: RRR, no m/r/g. No LE edema.  Respiratory: CTA bilaterally, no w/r/r. Normal respiratory effort  Abdomen: soft, ntnd, normal BS; no pain with firm palpation all quadrants.  Data Reviewed: Basic Metabolic Panel:  Lab 10/31/11 8295 10/30/11 1515  NA 139 138  K 3.2* 4.6  CL 103 100  CO2 28 30  GLUCOSE 91 100*  BUN 8 12  CREATININE 0.68 0.69  CALCIUM 8.3* 9.4  MG -- --  PHOS -- --   Liver Function Tests:  Lab 10/30/11 1515  AST 52*  ALT 29  ALKPHOS 92  BILITOT 0.7  PROT 6.9  ALBUMIN 3.5   CBC:  Lab 10/31/11 0300 10/30/11 2057 10/30/11 1515  WBC 7.1 8.7 8.1  NEUTROABS -- -- --  HGB 14.5 15.9* 16.3*  HCT 43.0 47.8* 47.7*  MCV 90.9 91.0 90.2  PLT 168 203 212   Studies: No results found.  Scheduled Meds:   . escitalopram  10 mg Oral Daily  . hydrochlorothiazide  12.5 mg Oral Daily  . metoprolol succinate  150 mg Oral Daily  . potassium chloride  20 mEq Oral Once   Continuous Infusions:   . sodium chloride 150 mL/hr at 10/31/11 0755  Principal Problem:  *Hematochezia Active Problems:  HEPATITIS C  POLYCYTHEMIA  Irritable bowel syndrome  Hypertension  CAD (coronary artery disease)     Amber Sacks, MD  [facility] Team 5 Pager [phone]. If 7PM-7AM, please contact night-coverage at [email or web address], password [facility] 10/31/2011, 1:18 PM  LOS: 1 day

## 2011-11-03 NOTE — ED Provider Notes (Signed)
History     CSN: [id number]  Arrival date & time 10/30/11  1239   First MD Initiated Contact with Patient 10/30/11 1326      Chief Complaint  Patient presents with  . Rectal Bleeding    (Consider location/radiation/quality/duration/timing/severity/associated sxs/prior treatment) HPI Comments: 61 year old woman presented to ED with history of bleeding. Hx of polycythemia and diverticular disease per Endoscopy. Last night after dinner patient had immediate cramping and developed several episodes of diarrhea, which recurred throughout the evening. Prior to going to bed she noted a small amount of blood in the commode and blood on tissue paper. During the night she developed frank blood with diarrhea, several episodes, the last one being prior to presenting to ED. Since being in the ED she has had no pain or bleeding. She denies nausea, vomiting. She has a history of diverticulosis and is closely followed by Dr. Kinnie Scales with colonoscopy every 3 years for polyps. She has never had bleeding before. No chest pain, sob.  Patient is a 61 y.o. female presenting with hematochezia. The history is provided by the patient.  Rectal Bleeding  Pertinent negatives include no abdominal pain, no diarrhea, no nausea, no vomiting, no hematuria, no chest pain, no headaches and no coughing.    Past Medical History  Diagnosis Date  . Hepatitis C   . Hypertension   . Polycythemia 2009  . CAD (coronary artery disease)   . Arthritis   . GERD (gastroesophageal reflux disease)     Past Surgical History  Procedure Date  . 2 stints Aug 26,2010    Dr. Rudean Haskell  . Cartoid endartherectomy 2007  . Coronary angioplasty with stent placement     History reviewed. No pertinent family history.  History  Substance Use Topics  . Smoking status: Current Every Day Smoker -- 0.5 packs/day for 45 years  . Smokeless tobacco: Never Used  . Alcohol Use: No     occassionally    OB History    Grav Para Term Preterm  Abortions TAB SAB Ect Mult Living                  Review of Systems  Constitutional: Positive for activity change.  HENT: Negative for facial swelling and neck pain.   Respiratory: Negative for cough, shortness of breath and wheezing.   Cardiovascular: Negative for chest pain.  Gastrointestinal: Positive for blood in stool and hematochezia. Negative for nausea, vomiting, abdominal pain, diarrhea, constipation and abdominal distention.  Genitourinary: Negative for dysuria, hematuria and difficulty urinating.  Skin: Negative for color change.  Neurological: Negative for speech difficulty and headaches.  Hematological: Does not bruise/bleed easily.  Psychiatric/Behavioral: Negative for confusion.    Allergies  Diphenhydramine hcl  Home Medications   Current Outpatient Rx  Name Route Sig Dispense Refill  . ALPRAZOLAM 0.5 MG PO TABS Oral Take 0.5 mg by mouth 3 (three) times daily as needed.    Marland Kitchen CALCIUM CARBONATE-VITAMIN D 600-400 MG-UNIT PO TABS Oral Take 1 tablet by mouth daily.      Marland Kitchen CLOTRIMAZOLE-BETAMETHASONE 1-0.05 % EX CREA Topical Apply 1 application topically 2 (two) times daily.    Marland Kitchen ESCITALOPRAM OXALATE 10 MG PO TABS Oral Take 10 mg by mouth daily.    Marland Kitchen HYDROCHLOROTHIAZIDE 12.5 MG PO CAPS  12.5 mg.     . METOPROLOL SUCCINATE ER 100 MG PO TB24  150 mg daily.     . MULTIVITAMIN PO Oral Take by mouth every morning.    Marland Kitchen OMEPRAZOLE 20  MG PO CPDR Oral Take 20 mg by mouth daily.    Marland Kitchen VITAMIN E PO Oral Take 400 mg by mouth daily.       BP 101/67  Pulse 55  Temp 97.7 F (36.5 C) (Oral)  Resp 17  Ht 5\' 1"  (1.549 m)  Wt 153 lb (69.4 kg)  BMI 28.91 kg/m2  SpO2 95%  Physical Exam  Constitutional: She is oriented to person, place, and time. She appears well-developed and well-nourished.  HENT:  Head: Normocephalic and atraumatic.  Eyes: EOM are normal. Pupils are equal, round, and reactive to light.  Neck: Neck supple.  Cardiovascular: Normal rate, regular rhythm and  normal heart sounds.   No murmur heard. Pulmonary/Chest: Effort normal. No respiratory distress.  Abdominal: Soft. She exhibits no distension. There is no tenderness. There is no rebound and no guarding.       Grossly bloody stools on DRE  Neurological: She is alert and oriented to person, place, and time.  Skin: Skin is warm and dry.    ED Course  Procedures (including critical care time)  Labs Reviewed  CBC - Abnormal; Notable for the following:    RBC 5.29 (*)     Hemoglobin 16.3 (*)     HCT 47.7 (*)     All other components within normal limits  COMPREHENSIVE METABOLIC PANEL - Abnormal; Notable for the following:    Glucose, Bld 100 (*)     AST 52 (*)     All other components within normal limits  BASIC METABOLIC PANEL - Abnormal; Notable for the following:    Potassium 3.2 (*)  DELTA CHECK NOTED   Calcium 8.3 (*)     All other components within normal limits  CBC - Abnormal; Notable for the following:    RBC 5.25 (*)     Hemoglobin 15.9 (*)     HCT 47.8 (*)     All other components within normal limits  PROTIME-INR  APTT  TYPE AND SCREEN  ABO/RH  CBC  LAB REPORT - SCANNED   No results found.   1. History of GI diverticular bleed   2. Hematochezia   3. Polycythemia   4. Acute hepatitis C without mention of hepatic coma       MDM  DDx includes: Esophagitis Mallory Weiss tear [place]  Variceal bleeding PUD/Gastritis/ulcers Diverticular bleed Colon cancer Rectal bleed Internal hemorrhoids External hemorrhoids  Pt with diverticular disease comes in with hematochezia. Will admit for serial CBC, and possible scope.        Derwood Kaplan, MD 11/03/11 [phone]

## 2011-11-27 ENCOUNTER — Telehealth: Payer: Self-pay | Admitting: Hematology & Oncology

## 2011-11-27 ENCOUNTER — Other Ambulatory Visit: Payer: 59 | Admitting: Lab

## 2011-11-27 ENCOUNTER — Ambulatory Visit: Payer: 59 | Admitting: Hematology & Oncology

## 2011-11-27 NOTE — Telephone Encounter (Signed)
Patient called and cx 11/27/11 appt and resch for 11/29/11.  Nurse susan was notified of appt changes

## 2011-11-29 ENCOUNTER — Ambulatory Visit: Payer: 59 | Admitting: Hematology & Oncology

## 2011-11-29 ENCOUNTER — Other Ambulatory Visit: Payer: 59 | Admitting: Lab

## 2011-12-04 ENCOUNTER — Telehealth: Payer: Self-pay | Admitting: Hematology & Oncology

## 2011-12-04 ENCOUNTER — Ambulatory Visit: Payer: 59 | Admitting: Medical

## 2011-12-04 ENCOUNTER — Other Ambulatory Visit: Payer: 59 | Admitting: Lab

## 2011-12-04 NOTE — Telephone Encounter (Signed)
Pt moved 10-14 to 10-18

## 2011-12-08 ENCOUNTER — Ambulatory Visit (HOSPITAL_BASED_OUTPATIENT_CLINIC_OR_DEPARTMENT_OTHER): Payer: 59 | Admitting: Medical

## 2011-12-08 ENCOUNTER — Other Ambulatory Visit (HOSPITAL_BASED_OUTPATIENT_CLINIC_OR_DEPARTMENT_OTHER): Payer: 59 | Admitting: Lab

## 2011-12-08 VITALS — BP 128/67 | HR 66 | Temp 97.5°F | Resp 16 | Ht 61.0 in | Wt 152.0 lb

## 2011-12-08 DIAGNOSIS — D45 Polycythemia vera: Secondary | ICD-10-CM

## 2011-12-08 DIAGNOSIS — B1710 Acute hepatitis C without hepatic coma: Secondary | ICD-10-CM

## 2011-12-08 DIAGNOSIS — B1920 Unspecified viral hepatitis C without hepatic coma: Secondary | ICD-10-CM

## 2011-12-08 DIAGNOSIS — D751 Secondary polycythemia: Secondary | ICD-10-CM

## 2011-12-08 LAB — CMP (CANCER CENTER ONLY)
AST: 87 U/L — ABNORMAL HIGH (ref 11–38)
Alkaline Phosphatase: 101 U/L — ABNORMAL HIGH (ref 26–84)
BUN, Bld: 13 mg/dL (ref 7–22)
Creat: 0.8 mg/dl (ref 0.6–1.2)
Potassium: 4.1 mEq/L (ref 3.3–4.7)
Total Bilirubin: 0.7 mg/dl (ref 0.20–1.60)

## 2011-12-08 LAB — CBC WITH DIFFERENTIAL (CANCER CENTER ONLY)
BASO%: 0.3 % (ref 0.0–2.0)
LYMPH%: 32.8 % (ref 14.0–48.0)
MCV: 90 fL (ref 81–101)
MONO#: 0.6 10*3/uL (ref 0.1–0.9)
MONO%: 7.3 % (ref 0.0–13.0)
Platelets: 219 10*3/uL (ref 145–400)
RDW: 12.7 % (ref 11.1–15.7)
WBC: 7.8 10*3/uL (ref 3.9–10.0)

## 2011-12-08 LAB — AFP TUMOR MARKER: AFP-Tumor Marker: 22.8 ng/mL — ABNORMAL HIGH (ref 0.0–8.0)

## 2011-12-08 NOTE — Progress Notes (Signed)
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 #2 hepatitis C.  Current therapy: #1 phlebotomy to maintain hematocrit less than 45%. #2 aspirin 81 mg by mouth daily.  Interim history: [name] presents today for an office followup visit.  She reports, that she had an air, infection and is on antibiotics.  For that.  She does report, that she's noticed being more fatigued.  She still remains on an aspirin a day.  She did have an overnight stay in the hospital.  Back in September secondary to diverticulosis.  We have not had to phlebotomize [name], probably in about a year.  We try and keep her hematocrit below 45%.  Her hemoglobin today is elevated at 47.7, in her hemoglobin is 16.1.  She will need a phlebotomy.  [name], reports, that she gets her phlebotomies done at [facility].  Her last ferritin back on August 1, was 48.  We do check an AFP tumor marker on her secondary to her.  Her hep C.  Her last AFP was 23.9.  [name] reports, that she still continues to smoke although she is trying to cut back.  She has a decent appetite.  She denies any nausea, vomiting, diarrhea, constipation.  She denies any cough, chest pain, shortness of breath.  Any fevers, chills, or night sweats.  She denies any obvious, or abnormal bleeding or bruising.  She denies any type of jaundice.  She's not had any lower leg swelling.  She denies any headaches, visual changes, or rashes.  She is active and is able to perform her activities of daily living independently without any hindrance or decline.  Review of Systems: fatigue, otherwise: Pt. Denies any changes in their vision, hearing, adenopathy, fevers, chills, nausea, vomiting, diarrhea, constipation, chest pain, shortness of breath, passing blood, passing out, blacking out,  any changes in skin, joints, neurologic or psychiatric except as noted.  Physical Exam: This is a 61 year old, well-developed, well-nourished, white female,  in no obvious distress Vitals: Temperature 97.5 degrees, pulse 66, respirations 16, blood pressure 120/67, weight 152 pounds HEENT reveals a normocephalic, atraumatic skull, no scleral icterus, no oral lesions  Neck is supple without any cervical or supraclavicular adenopathy.  Lungs are clear to auscultation bilaterally. There are no wheezes, rales or rhonci Cardiac is regular rate and rhythm with a normal S1 and S2. There are no murmurs, rubs, or bruits.  Abdomen is soft with good bowel sounds, there is no palpable mass. There is no palpable hepatosplenomegaly. There is no palpable fluid wave.  Musculoskeletal no tenderness of the spine, ribs, or hips.  Extremities there are no clubbing, cyanosis, or edema.  Skin no petechia, purpura or ecchymosis Neurologic is nonfocal.  Laboratory Data: White count 7.8, hemoglobin 16.1, hematocrit 47.7, platelets 219,000  Current Outpatient Prescriptions on File Prior to Visit  Medication Sig Dispense Refill  . ALPRAZolam (XANAX) 0.5 MG tablet Take 0.5 mg by mouth 3 (three) times daily as needed.      . Calcium Carbonate-Vitamin D (CALCIUM 600/VITAMIN D) 600-400 MG-UNIT per tablet Take 1 tablet by mouth daily.        . clotrimazole-betamethasone (LOTRISONE) cream Apply 1 application topically 2 (two) times daily.      Marland Kitchen escitalopram (LEXAPRO) 10 MG tablet Take 10 mg by mouth daily.      . hydrochlorothiazide (,MICROZIDE/HYDRODIURIL,) 12.5 MG capsule 12.5 mg.       . metoprolol (TOPROL-XL) 100 MG 24 hr tablet 150 mg daily.       Marland Kitchen  Multiple Vitamins-Minerals (MULTIVITAMIN PO) Take by mouth every morning.      Marland Kitchen omeprazole (PRILOSEC) 20 MG capsule Take 20 mg by mouth daily.      Marland Kitchen VITAMIN E PO Take 400 mg by mouth daily.        Assessment/Plan: This is a pleasant, 62 year old, white female, with the following issues:  #1 polycythemia vera. She is 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  Her hematocrit is above 45%, today.  It is 47.7, as such, she will need a phlebotomy.  We  will go ahead and get that set up at [facility] with [facility].  #2 hepatitis C. She continues to followup with Dr. Kinnie Scales.   #3 followup.  We will follow back up with [name] in 3 months, but before then should there be questions or concerns.

## 2011-12-11 ENCOUNTER — Other Ambulatory Visit: Payer: Self-pay | Admitting: *Deleted

## 2011-12-11 NOTE — Progress Notes (Signed)
Received a call from Chrystie Nose, RN from the [place] office. Pt is scheduled for a phlebotomy tomorrow per Eunice Blase, PA. Order placed in the EMR as noted in her dictation from today to prevent any delays in care.

## 2011-12-12 ENCOUNTER — Ambulatory Visit (HOSPITAL_BASED_OUTPATIENT_CLINIC_OR_DEPARTMENT_OTHER): Payer: 59

## 2011-12-12 DIAGNOSIS — D45 Polycythemia vera: Secondary | ICD-10-CM

## 2011-12-12 NOTE — Progress Notes (Signed)
Patient given cheese and crackers and drink prior to procedure.

## 2011-12-12 NOTE — Progress Notes (Signed)
20g angiocatheter and 500 ml empty bag method used to phlebotomize patient today. Pt. arrived without eating breakfast.  Crackers/cheese and drink given prior to phlebotomy.  Rt.medial antecubital site used for phlebotomy and 500g phlebotomized today without any incident.  Pt. tolerated procedure well. Post procedure snacks and drink given.  No incident.  Procedure took 15 mins to complete. HL

## 2011-12-12 NOTE — Patient Instructions (Addendum)
Therapeutic Phlebotomy Therapeutic phlebotomy is the controlled removal of blood from your body for the purpose of treating a medical condition. It is similar to donating blood. Usually, about a pint (470 mL) of blood is removed. The average adult has 9 to 12 pints (4.3 to 5.7 L) of blood. Therapeutic phlebotomy may be used to treat the following medical conditions:  Hemochromatosis. This is a condition in which there is too much iron in the blood.  Polycythemia vera. This is a condition in which there are too many red cells in the blood.  Porphyria cutanea tarda. This is a disease usually passed from one generation to the next (inherited). It is a condition in which an important part of hemoglobin is not made properly. This results in the build up of abnormal amounts of porphyrins in the body.  Sickle cell disease. This is an inherited disease. It is a condition in which the red blood cells form an abnormal crescent shape rather than a round shape. LET YOUR CAREGIVER KNOW ABOUT:  Allergies.  Medicines taken including herbs, eyedrops, over-the-counter medicines, and creams.  Use of steroids (by mouth or creams).  Previous problems with anesthetics or numbing medicine.  History of blood clots.  History of bleeding or blood problems.  Previous surgery.  Possibility of pregnancy, if this applies. RISKS AND COMPLICATIONS This is a simple and safe procedure. Problems are unlikely. However, problems can occur and may include:  Nausea or lightheadedness.  Low blood pressure.  Soreness, bleeding, swelling, or bruising at the needle insertion site.  Infection. BEFORE THE PROCEDURE  This is a procedure that can be done as an outpatient. Confirm the time that you need to arrive for your procedure. Confirm whether there is a need to fast or withhold any medications. It is helpful to wear clothing with sleeves that can be raised above the elbow. A blood sample may be done to determine the  amount of red blood cells or iron in your blood. Plan ahead of time to have someone drive you home after the procedure. PROCEDURE The entire procedure from preparation through recovery takes about 1 hour. The actual collection takes about 10 to 15 minutes.  A needle will be inserted into your vein.  Tubing and a collection bag will be attached to that needle.  Blood will flow through the needle and tubing into the collection bag.  You may be asked to open and close your hand slowly and continuously during the entire collection.  Once the specified amount of blood has been removed from your body, the collection bag and tubing will be clamped.  The needle will be removed.  Pressure will be held on the site of the needle insertion to stop the bleeding. Then a bandage will be placed over the needle insertion site. AFTER THE PROCEDURE  Your recovery will be assessed and monitored. If there are no problems, as an outpatient, you should be able to go home shortly after the procedure.  Document Released: 07/11/2010 Document Revised: 05/01/2011 Document Reviewed: 07/11/2010 [employer] Patient Information 2013 [employer].  

## 2012-03-02 ENCOUNTER — Emergency Department (HOSPITAL_COMMUNITY): Payer: 59

## 2012-03-02 ENCOUNTER — Encounter (HOSPITAL_COMMUNITY): Payer: Self-pay | Admitting: Emergency Medicine

## 2012-03-02 ENCOUNTER — Inpatient Hospital Stay (HOSPITAL_COMMUNITY)
Admission: EM | Admit: 2012-03-02 | Discharge: 2012-03-08 | DRG: 189 | Disposition: A | Discharge status: Home / Self Care | Payer: 59 | Attending: Internal Medicine | Admitting: Internal Medicine

## 2012-03-02 DIAGNOSIS — Z7982 Long term (current) use of aspirin: Secondary | ICD-10-CM

## 2012-03-02 DIAGNOSIS — I10 Essential (primary) hypertension: Secondary | ICD-10-CM | POA: Diagnosis present

## 2012-03-02 DIAGNOSIS — J9600 Acute respiratory failure, unspecified whether with hypoxia or hypercapnia: Principal | ICD-10-CM | POA: Diagnosis present

## 2012-03-02 DIAGNOSIS — Z79899 Other long term (current) drug therapy: Secondary | ICD-10-CM

## 2012-03-02 DIAGNOSIS — R059 Cough, unspecified: Secondary | ICD-10-CM

## 2012-03-02 DIAGNOSIS — R0600 Dyspnea, unspecified: Secondary | ICD-10-CM

## 2012-03-02 DIAGNOSIS — E871 Hypo-osmolality and hyponatremia: Secondary | ICD-10-CM | POA: Diagnosis present

## 2012-03-02 DIAGNOSIS — R05 Cough: Secondary | ICD-10-CM

## 2012-03-02 DIAGNOSIS — R197 Diarrhea, unspecified: Secondary | ICD-10-CM | POA: Diagnosis not present

## 2012-03-02 DIAGNOSIS — K219 Gastro-esophageal reflux disease without esophagitis: Secondary | ICD-10-CM | POA: Diagnosis present

## 2012-03-02 DIAGNOSIS — J111 Influenza due to unidentified influenza virus with other respiratory manifestations: Secondary | ICD-10-CM | POA: Diagnosis present

## 2012-03-02 DIAGNOSIS — Z888 Allergy status to other drugs, medicaments and biological substances status: Secondary | ICD-10-CM

## 2012-03-02 DIAGNOSIS — I2510 Atherosclerotic heart disease of native coronary artery without angina pectoris: Secondary | ICD-10-CM

## 2012-03-02 DIAGNOSIS — Z9861 Coronary angioplasty status: Secondary | ICD-10-CM

## 2012-03-02 DIAGNOSIS — Z8619 Personal history of other infectious and parasitic diseases: Secondary | ICD-10-CM

## 2012-03-02 DIAGNOSIS — E876 Hypokalemia: Secondary | ICD-10-CM | POA: Diagnosis present

## 2012-03-02 DIAGNOSIS — F17200 Nicotine dependence, unspecified, uncomplicated: Secondary | ICD-10-CM | POA: Diagnosis present

## 2012-03-02 DIAGNOSIS — M129 Arthropathy, unspecified: Secondary | ICD-10-CM | POA: Diagnosis present

## 2012-03-02 DIAGNOSIS — B1710 Acute hepatitis C without hepatic coma: Secondary | ICD-10-CM

## 2012-03-02 DIAGNOSIS — R0902 Hypoxemia: Secondary | ICD-10-CM | POA: Diagnosis present

## 2012-03-02 DIAGNOSIS — J441 Chronic obstructive pulmonary disease with (acute) exacerbation: Secondary | ICD-10-CM | POA: Diagnosis present

## 2012-03-02 LAB — CBC WITH DIFFERENTIAL/PLATELET
Basophils Relative: 0 % (ref 0–1)
HCT: 43.2 % (ref 36.0–46.0)
Hemoglobin: 14.5 g/dL (ref 12.0–15.0)
MCH: 28.3 pg (ref 26.0–34.0)
MCHC: 33.6 g/dL (ref 30.0–36.0)
Monocytes Absolute: 0.8 10*3/uL (ref 0.1–1.0)
Monocytes Relative: 9 % (ref 3–12)
Neutro Abs: 7.5 10*3/uL (ref 1.7–7.7)

## 2012-03-02 LAB — BASIC METABOLIC PANEL
BUN: 17 mg/dL (ref 6–23)
CO2: 28 mEq/L (ref 19–32)
Chloride: 89 mEq/L — ABNORMAL LOW (ref 96–112)
GFR calc Af Amer: >90 mL/min (ref >90)
Glucose, Bld: 92 mg/dL (ref 70–99)
Potassium: 3.3 mEq/L — ABNORMAL LOW (ref 3.5–5.1)

## 2012-03-02 MED ORDER — LEVOFLOXACIN IN D5W 500 MG/100ML IV SOLN
500.0000 mg | Freq: Every day | INTRAVENOUS | Status: DC
Start: 2012-03-02 — End: 2012-03-04
  Administered 2012-03-02 – 2012-03-03 (×2): 500 mg via INTRAVENOUS
  Filled 2012-03-02 (×3): qty 100

## 2012-03-02 MED ORDER — IPRATROPIUM BROMIDE 0.02 % IN SOLN
0.5000 mg | Freq: Four times a day (QID) | RESPIRATORY_TRACT | Status: DC
  Administered 2012-03-03 – 2012-03-04 (×8): 0.5 mg via RESPIRATORY_TRACT
  Filled 2012-03-02 (×9): qty 2.5

## 2012-03-02 MED ORDER — ASPIRIN EC 325 MG PO TBEC
325.0000 mg | DELAYED_RELEASE_TABLET | Freq: Every day | ORAL | Status: DC
  Administered 2012-03-03 – 2012-03-08 (×6): 325 mg via ORAL
  Filled 2012-03-02 (×6): qty 1

## 2012-03-02 MED ORDER — IPRATROPIUM BROMIDE 0.02 % IN SOLN
0.5000 mg | Freq: Once | RESPIRATORY_TRACT | Status: AC
  Administered 2012-03-02: 0.5 mg via RESPIRATORY_TRACT
  Filled 2012-03-02: qty 2.5

## 2012-03-02 MED ORDER — GUAIFENESIN ER 600 MG PO TB12
600.0000 mg | ORAL_TABLET | Freq: Two times a day (BID) | ORAL | Status: DC
  Administered 2012-03-03 – 2012-03-04 (×4): 600 mg via ORAL
  Filled 2012-03-02 (×5): qty 1

## 2012-03-02 MED ORDER — ENOXAPARIN SODIUM 40 MG/0.4ML ~~LOC~~ SOLN
40.0000 mg | Freq: Every day | SUBCUTANEOUS | Status: DC
  Filled 2012-03-02 (×2): qty 0.4

## 2012-03-02 MED ORDER — SODIUM CHLORIDE 0.9 % IJ SOLN
3.0000 mL | Freq: Two times a day (BID) | INTRAMUSCULAR | Status: DC
  Administered 2012-03-05 – 2012-03-08 (×6): 3 mL via INTRAVENOUS

## 2012-03-02 MED ORDER — ONDANSETRON HCL 4 MG PO TABS: 4.0000 mg | ORAL_TABLET | Freq: Four times a day (QID) | ORAL | Status: DC | PRN

## 2012-03-02 MED ORDER — ONDANSETRON HCL 4 MG/2ML IJ SOLN: 4.0000 mg | Freq: Four times a day (QID) | INTRAMUSCULAR | Status: DC | PRN

## 2012-03-02 MED ORDER — HYDROCODONE-ACETAMINOPHEN 5-325 MG PO TABS: 1.0000 | ORAL_TABLET | ORAL | Status: DC | PRN

## 2012-03-02 MED ORDER — ACETAMINOPHEN 325 MG PO TABS
650.0000 mg | ORAL_TABLET | Freq: Four times a day (QID) | ORAL | Status: DC | PRN
  Administered 2012-03-02 – 2012-03-06 (×4): 650 mg via ORAL
  Filled 2012-03-02 (×4): qty 2

## 2012-03-02 MED ORDER — DOCUSATE SODIUM 100 MG PO CAPS
100.0000 mg | ORAL_CAPSULE | Freq: Two times a day (BID) | ORAL | Status: DC
  Administered 2012-03-03 – 2012-03-07 (×4): 100 mg via ORAL
  Filled 2012-03-02 (×13): qty 1

## 2012-03-02 MED ORDER — ESCITALOPRAM OXALATE 5 MG PO TABS
5.0000 mg | ORAL_TABLET | Freq: Every day | ORAL | Status: DC
  Administered 2012-03-03 – 2012-03-08 (×6): 5 mg via ORAL
  Filled 2012-03-02 (×6): qty 1

## 2012-03-02 MED ORDER — POTASSIUM CHLORIDE IN NACL 20-0.9 MEQ/L-% IV SOLN
INTRAVENOUS | Status: DC
  Administered 2012-03-02: 1000 mL via INTRAVENOUS
  Administered 2012-03-05: 07:00:00 via INTRAVENOUS
  Filled 2012-03-02 (×6): qty 1000

## 2012-03-02 MED ORDER — ALPRAZOLAM 0.5 MG PO TABS
0.5000 mg | ORAL_TABLET | Freq: Every day | ORAL | Status: DC
  Administered 2012-03-02 – 2012-03-07 (×6): 0.5 mg via ORAL
  Filled 2012-03-02 (×6): qty 1

## 2012-03-02 MED ORDER — ACETAMINOPHEN 650 MG RE SUPP: 650.0000 mg | Freq: Four times a day (QID) | RECTAL | Status: DC | PRN

## 2012-03-02 MED ORDER — METOPROLOL SUCCINATE ER 50 MG PO TB24
150.0000 mg | ORAL_TABLET | Freq: Every day | ORAL | Status: DC
  Administered 2012-03-03 – 2012-03-08 (×6): 150 mg via ORAL
  Filled 2012-03-02 (×7): qty 1

## 2012-03-02 MED ORDER — PANTOPRAZOLE SODIUM 40 MG PO TBEC
40.0000 mg | DELAYED_RELEASE_TABLET | Freq: Every day | ORAL | Status: DC
  Administered 2012-03-03 – 2012-03-08 (×6): 40 mg via ORAL
  Filled 2012-03-02 (×6): qty 1

## 2012-03-02 MED ORDER — ALBUTEROL SULFATE (5 MG/ML) 0.5% IN NEBU
5.0000 mg | INHALATION_SOLUTION | Freq: Once | RESPIRATORY_TRACT | Status: AC
  Administered 2012-03-02: 5 mg via RESPIRATORY_TRACT
  Filled 2012-03-02: qty 1

## 2012-03-02 MED ORDER — MORPHINE SULFATE 2 MG/ML IJ SOLN: 1.0000 mg | INTRAMUSCULAR | Status: DC | PRN

## 2012-03-02 MED ORDER — ALBUTEROL SULFATE (5 MG/ML) 0.5% IN NEBU
5.0000 mg | INHALATION_SOLUTION | RESPIRATORY_TRACT | Status: AC | PRN
  Administered 2012-03-03 (×2): 5 mg via RESPIRATORY_TRACT
  Filled 2012-03-02 (×2): qty 0.5

## 2012-03-02 MED ORDER — GUAIFENESIN-DM 100-10 MG/5ML PO SYRP
5.0000 mL | ORAL_SOLUTION | ORAL | Status: DC | PRN
  Administered 2012-03-03 – 2012-03-07 (×9): 5 mL via ORAL
  Filled 2012-03-02 (×9): qty 10

## 2012-03-02 NOTE — ED Notes (Signed)
RN to obtain labs with start of IV 

## 2012-03-02 NOTE — ED Notes (Signed)
Pt sent from [facility] MD for sob ,fever and unable increase pox>.90

## 2012-03-02 NOTE — ED Notes (Signed)
Pt placed on 2l pox 90%

## 2012-03-02 NOTE — ED Notes (Signed)
Report attempted to call to floor.  Nurse will call back

## 2012-03-02 NOTE — H&P (Signed)
PCP:    Elby Showers, MD  Cardiology: Verdis Prime Hematology: Myna Hidalgo GI: Medoff  Chief Complaint:  cough  HPI: [name] is a 62 y.o. female   has a past medical history of Hepatitis C; Hypertension; Polycythemia (2009); CAD (coronary artery disease); Arthritis; and GERD (gastroesophageal reflux disease).   Presented with  2 day history of cough and fever, myalgias and arthralgias, patient also endorsing headache. She feels very weak and very short of breath.  She presented to walk -in-clinic and was found to be hypoxic down to 86% on RA. Her Flu swab was positive. She was sent to [facility] ER CXR did not show any evidence of PNA but given her hx of COPD she was admitted with likely COPD exacerbation in the setting of acute influenza infection.   Review of Systems:     Pertinent positives include: Fevers, chills, fatigue, shortness of breath at rest, dyspnea on exertion, excess mucus,   productive cough  Constitutional:  No weight loss, night sweats, weight loss  HEENT:  No headaches, Difficulty swallowing,Tooth/dental problems,Sore throat,  No sneezing, itching, ear ache, nasal congestion, post nasal drip,  Cardio-vascular:  No chest pain, Orthopnea, PND, anasarca, dizziness, palpitations.no Bilateral lower extremity swelling  GI:  No heartburn, indigestion, abdominal pain, nausea, vomiting, diarrhea, change in bowel habits, loss of appetite, melena, blood in stool, hematemesis Resp:   No coughing up of blood.No change in color of mucus.No wheezing. Skin:  no rash or lesions. No jaundice GU:  no dysuria, change in color of urine, no urgency or frequency. No straining to urinate.  No flank pain.  Musculoskeletal:  No joint pain or no joint swelling. No decreased range of motion. No back pain.  Psych:  No change in mood or affect. No depression or anxiety. No memory loss.  Neuro: no localizing neurological complaints, no tingling, no weakness, no double vision, no gait  abnormality, no slurred speech, no confusion  Otherwise ROS are negative except for above, 10 systems were reviewed  Past Medical History: Past Medical History  Diagnosis Date  . Hepatitis C   . Hypertension   . Polycythemia 2009  . CAD (coronary artery disease)   . Arthritis   . GERD (gastroesophageal reflux disease)    Past Surgical History  Procedure Date  . 2 stints Aug 26,2010    Dr. Rudean Haskell  . Cartoid endartherectomy 2007  . Coronary angioplasty with stent placement 10/09/2008     Medications: Prior to Admission medications   Medication Sig Start Date End Date Taking? Authorizing Provider  ALPRAZolam Prudy Feeler) 0.5 MG tablet Take 0.5 mg by mouth at bedtime.  06/30/11 12/29/12 Yes Kristian Covey, MD  aspirin 325 MG EC tablet Take 325 mg by mouth daily.    Yes Historical Provider, MD  clotrimazole-betamethasone (LOTRISONE) cream Apply 1 application topically 2 (two) times daily. 04/05/11  Yes Gordy Savers, MD  escitalopram (LEXAPRO) 10 MG tablet Take 5 mg by mouth daily.    Yes Historical Provider, MD  hydrochlorothiazide (,MICROZIDE/HYDRODIURIL,) 12.5 MG capsule Take 12.5 mg by mouth daily.  06/26/10  Yes Historical Provider, MD  lansoprazole (PREVACID) 30 MG capsule Take 30 mg by mouth daily as needed. Ulcer/ acid reflux   Yes Historical Provider, MD  metoprolol (TOPROL-XL) 100 MG 24 hr tablet Take 150 mg by mouth daily.  05/30/10  Yes Historical Provider, MD  Multiple Vitamin (MULTIVITAMIN WITH MINERALS) TABS Take 1 tablet by mouth daily.   Yes Historical Provider, MD  vitamin E  400 UNIT capsule Take 400 Units by mouth daily.   Yes Historical Provider, MD  VITAMIN E PO Take 400 mg by mouth daily.     Historical Provider, MD    Allergies:   Allergies  Allergen Reactions  . Diphenhydramine Hcl Hives    Social History:  Ambulatory   independently   Lives at   Home alone   reports that she has been smoking.  She has never used smokeless tobacco. She reports that she  does not drink alcohol or use illicit drugs.   Family History: family history includes Heart disease in her father and mother.    Physical Exam: Patient Vitals for the past 24 hrs:  BP Temp Temp src Pulse Resp SpO2 Height Weight  03/02/12 1929 - - - - - 93 % - -  03/02/12 1923 122/58 mmHg - - 93  18  93 % - -  03/02/12 1643 125/91 mmHg 100.1 F (37.8 C) Oral 87  22  90 % 5\' 1"  (1.549 m) 66.679 kg (147 lb)    1. General:  in No Acute distress 2. Psychological: Alert and  Oriented 3. Head/ENT:     Dry Mucous Membranes                          Head Non traumatic, neck supple                          Normal   Dentition 4. SKIN:  decreased Skin turgor,  Skin clean Dry and intact no rash 5. Heart: Regular rate and rhythm no Murmur, Rub or gallop 6. Lungs: no wheezes occasional crackles, distant breath sounds   7. Abdomen: Soft, non-tender, Non distended 8. Lower extremities: no clubbing, cyanosis, or edema 9. Neurologically Grossly intact, moving all 4 extremities equally 10. MSK: Normal range of motion  body mass index is 27.78 kg/(m^2).   Labs on Admission:   [facility] 03/02/12 1654  NA 130*  K 3.3*  CL 89*  CO2 28  GLUCOSE 92  BUN 17  CREATININE 0.80  CALCIUM 9.0  MG --  PHOS --   No results found for this basename: AST:2,ALT:2,ALKPHOS:2,BILITOT:2,PROT:2,ALBUMIN:2 in the last 72 hours No results found for this basename: LIPASE:2,AMYLASE:2 in the last 72 hours  Basename 03/02/12 1654  WBC 9.2  NEUTROABS 7.5  HGB 14.5  HCT 43.2  MCV 84.4  PLT 176   No results found for this basename: CKTOTAL:3,CKMB:3,CKMBINDEX:3,TROPONINI:3 in the last 72 hours No results found for this basename: TSH,T4TOTAL,FREET3,T3FREE,THYROIDAB in the last 72 hours No results found for this basename: VITAMINB12:2,FOLATE:2,FERRITIN:2,TIBC:2,IRON:2,RETICCTPCT:2 in the last 72 hours No results found for this basename: HGBA1C    Estimated Creatinine Clearance: 64.6 ml/min (by C-G formula based  on Cr of 0.8). ABG No results found for this basename: phart, pco2, po2, hco3, tco2, acidbasedef, o2sat     No results found for this basename: DDIMER     Cultures: No results found for this basename: sdes, specrequest, cult, reptstatus       Radiological Exams on Admission: Dg Chest 2 View  03/02/2012  *RADIOLOGY REPORT*  Clinical Data: Fever, shortness of breath and cough.  CHEST - 2 VIEW  Comparison: Chest x-ray 07/09/2009.  Findings: Lung volumes are normal.  No consolidative airspace disease.  No pleural effusions.  No pneumothorax.  No pulmonary nodule or mass noted.  Pulmonary vasculature and the cardiomediastinal silhouette are within normal limits.  Atherosclerotic calcifications are noted within the arch of the aorta.  IMPRESSION: 1. No radiographic evidence of acute cardiopulmonary disease. 2.  Atherosclerosis.   Original Report Authenticated By: Trudie Reed, M.D.     Chart has been reviewed  Assessment/Plan  62 yo with Influenza and COPD exacerbation    Present on Admission:  . Influenza - will continue tamiflu . COPD exacerbation -  Atrovent, Albuterol, Levaquin and mucinex hold off on prednisone given acute infection and no evidence of wheezing.  Marland Kitchen Hypertension - continue metoprolol . Hypoxia - likely due to COPD exacerbation . Hyponatremia - likely due to dehydration will order urine electrolytes and give gentle IVF . Hypokalemia - will replace   Prophylaxis: Lovenox  Protonix  CODE STATUS: FULL CODE  Other plan as per orders.  I have spent a total of 55 min on this admission  Duvall Comes 03/02/2012, 9:09 PM

## 2012-03-02 NOTE — ED Provider Notes (Addendum)
History     CSN: [id number]  Arrival date & time 03/02/12  1611   First MD Initiated Contact with Patient 03/02/12 1649      Chief Complaint  Patient presents with  . Shortness of Breath  . Fever    (Consider location/radiation/quality/duration/timing/severity/associated sxs/prior treatment) Patient is a 62 y.o. female presenting with shortness of breath and fever. The history is provided by the patient.  Shortness of Breath  Associated symptoms include a fever, cough and shortness of breath. Pertinent negatives include no chest pain and no sore throat.  Fever Primary symptoms of the febrile illness include fever, cough and shortness of breath. Primary symptoms do not include headaches, abdominal pain, nausea, vomiting, dysuria or rash.  pt states 2 days ago onset non productive cough, nasal congestion, body aches, fever. Felt mildly sob. No known ill contacts. No chest pain or discomfort. No leg pain or swelling. No orthopnea or pnd. No hx asthma or copd, +smoker. States went to [place] [facility] today, had flu test positive, as pulse ox was<90, they sent to ed.  Pt had single neb, states feels improved. No nvd. Normal appetite.     Past Medical History  Diagnosis Date  . Hepatitis C   . Hypertension   . Polycythemia 2009  . CAD (coronary artery disease)   . Arthritis   . GERD (gastroesophageal reflux disease)     Past Surgical History  Procedure Date  . 2 stints Aug 26,2010    Dr. Rudean Haskell  . Cartoid endartherectomy 2007  . Coronary angioplasty with stent placement     History reviewed. No pertinent family history.  History  Substance Use Topics  . Smoking status: Current Every Day Smoker -- 0.5 packs/day for 45 years  . Smokeless tobacco: Never Used  . Alcohol Use: No     Comment: occassionally    OB History    Grav Para Term Preterm Abortions TAB SAB Ect Mult Living                  Review of Systems  Constitutional: Positive for fever. Negative for chills.    HENT: Negative for sore throat and neck pain.   Eyes: Negative for discharge and redness.  Respiratory: Positive for cough and shortness of breath.   Cardiovascular: Negative for chest pain, palpitations and leg swelling.  Gastrointestinal: Negative for nausea, vomiting and abdominal pain.  Genitourinary: Negative for dysuria and flank pain.  Musculoskeletal: Negative for back pain.  Skin: Negative for rash.  Neurological: Negative for headaches.  Hematological: Does not bruise/bleed easily.  Psychiatric/Behavioral: Negative for confusion.    Allergies  Diphenhydramine hcl  Home Medications   Current Outpatient Rx  Name  Route  Sig  Dispense  Refill  . ALPRAZOLAM 0.5 MG PO TABS   Oral   Take 0.5 mg by mouth at bedtime.          . ASPIRIN 325 MG PO TBEC   Oral   Take 325 mg by mouth daily.          Marland Kitchen CLOTRIMAZOLE-BETAMETHASONE 1-0.05 % EX CREA   Topical   Apply 1 application topically 2 (two) times daily.         Marland Kitchen ESCITALOPRAM OXALATE 10 MG PO TABS   Oral   Take 5 mg by mouth daily.          Marland Kitchen HYDROCHLOROTHIAZIDE 12.5 MG PO CAPS   Oral   Take 12.5 mg by mouth daily.          Marland Kitchen  LANSOPRAZOLE 30 MG PO CPDR   Oral   Take 30 mg by mouth daily as needed. Ulcer/ acid reflux         . METOPROLOL SUCCINATE ER 100 MG PO TB24   Oral   Take 150 mg by mouth daily.          . ADULT MULTIVITAMIN W/MINERALS CH   Oral   Take 1 tablet by mouth daily.         Marland Kitchen VITAMIN E 400 UNITS PO CAPS   Oral   Take 400 Units by mouth daily.         Marland Kitchen VITAMIN E PO   Oral   Take 400 mg by mouth daily.            BP 125/91  Pulse 87  Temp 100.1 F (37.8 C) (Oral)  Resp 22  Ht 5\' 1"  (1.549 m)  Wt 147 lb (66.679 kg)  BMI 27.78 kg/m2  SpO2 90%  Physical Exam  Nursing note and vitals reviewed. Constitutional: She is oriented to person, place, and time. She appears well-developed and well-nourished. No distress.  HENT:  Mouth/Throat: Oropharynx is clear and  moist.       Mild nasal congestion  Eyes: Conjunctivae normal are normal. No scleral icterus.  Neck: Neck supple. No JVD present. No tracheal deviation present.  Cardiovascular: Normal rate, regular rhythm, normal heart sounds and intact distal pulses.   Pulmonary/Chest: Effort normal. No respiratory distress.       Mild wheezing. Upper resp congestion  Abdominal: Soft. Normal appearance. She exhibits no distension. There is no tenderness.  Musculoskeletal: She exhibits no edema and no tenderness.  Neurological: She is alert and oriented to person, place, and time.  Skin: Skin is warm and dry. No rash noted.  Psychiatric: She has a normal mood and affect.    ED Course  Procedures (including critical care time)   Labs Reviewed  BASIC METABOLIC PANEL  CBC WITH DIFFERENTIAL   Results for orders placed during the hospital encounter of 03/02/12  BASIC METABOLIC PANEL      Component Value Range   Sodium 130 (*) 135 - 145 mEq/L   Potassium 3.3 (*) 3.5 - 5.1 mEq/L   Chloride 89 (*) 96 - 112 mEq/L   CO2 28  19 - 32 mEq/L   Glucose, Bld 92  70 - 99 mg/dL   BUN 17  6 - 23 mg/dL   Creatinine, Ser 1.61  0.50 - 1.10 mg/dL   Calcium 9.0  8.4 - 09.6 mg/dL   GFR calc non Af Amer 78 (*) >90 mL/min   GFR calc Af Amer >90  >90 mL/min  CBC WITH DIFFERENTIAL      Component Value Range   WBC 9.2  4.0 - 10.5 K/uL   RBC 5.12 (*) 3.87 - 5.11 MIL/uL   Hemoglobin 14.5  12.0 - 15.0 g/dL   HCT 04.5  40.9 - 81.1 %   MCV 84.4  78.0 - 100.0 fL   MCH 28.3  26.0 - 34.0 pg   MCHC 33.6  30.0 - 36.0 g/dL   RDW 91.4  78.2 - 95.6 %   Platelets 176  150 - 400 K/uL   Neutrophils Relative 82 (*) 43 - 77 %   Neutro Abs 7.5  1.7 - 7.7 K/uL   Lymphocytes Relative 9 (*) 12 - 46 %   Lymphs Abs 0.9  0.7 - 4.0 K/uL   Monocytes Relative 9  3 - 12 %  Monocytes Absolute 0.8  0.1 - 1.0 K/uL   Eosinophils Relative 0  0 - 5 %   Eosinophils Absolute 0.0  0.0 - 0.7 K/uL   Basophils Relative 0  0 - 1 %   Basophils  Absolute 0.0  0.0 - 0.1 K/uL   Dg Chest 2 View  03/02/2012  *RADIOLOGY REPORT*  Clinical Data: Fever, shortness of breath and cough.  CHEST - 2 VIEW  Comparison: Chest x-ray 07/09/2009.  Findings: Lung volumes are normal.  No consolidative airspace disease.  No pleural effusions.  No pneumothorax.  No pulmonary nodule or mass noted.  Pulmonary vasculature and the cardiomediastinal silhouette are within normal limits. Atherosclerotic calcifications are noted within the arch of the aorta.  IMPRESSION: 1. No radiographic evidence of acute cardiopulmonary disease. 2.  Atherosclerosis.   Original Report Authenticated By: Trudie Reed, M.D.        MDM  Alb and atrovent neb.  Cxr.   Reviewed nursing notes and prior charts for additional history.   Recheck mild wheezing. Additional neb.  Recheck pulse ox 89-90% room air. Multiple pack year smoking hx.   Recheck pt up to bathroom, dyspnea, feels wheezy going to br.  Room air pulse ox 87%.  As pt w flu, dyspnea, wheezing, and hypoxia, will admit. Med service called.    [facility] tele, team 8      Suzi Roots, MD 03/02/12 2019  Suzi Roots, MD 03/02/12 2041

## 2012-03-02 NOTE — ED Notes (Signed)
[id number]<GN> Expected date:<BR> Expected time:<BR> Means of arrival:<BR> Comments:<BR> [place]

## 2012-03-03 DIAGNOSIS — I2510 Atherosclerotic heart disease of native coronary artery without angina pectoris: Secondary | ICD-10-CM

## 2012-03-03 DIAGNOSIS — R059 Cough, unspecified: Secondary | ICD-10-CM

## 2012-03-03 DIAGNOSIS — R05 Cough: Secondary | ICD-10-CM

## 2012-03-03 DIAGNOSIS — B1710 Acute hepatitis C without hepatic coma: Secondary | ICD-10-CM

## 2012-03-03 LAB — TSH: TSH: 4.545 u[IU]/mL — ABNORMAL HIGH (ref 0.350–4.500)

## 2012-03-03 LAB — COMPREHENSIVE METABOLIC PANEL
ALT: 29 U/L (ref 0–35)
AST: 70 U/L — ABNORMAL HIGH (ref 0–37)
Albumin: 2.6 g/dL — ABNORMAL LOW (ref 3.5–5.2)
Alkaline Phosphatase: 73 U/L (ref 39–117)
Calcium: 8.1 mg/dL — ABNORMAL LOW (ref 8.4–10.5)
Potassium: 3 mEq/L — ABNORMAL LOW (ref 3.5–5.1)
Sodium: 132 mEq/L — ABNORMAL LOW (ref 135–145)
Total Protein: 5.6 g/dL — ABNORMAL LOW (ref 6.0–8.3)

## 2012-03-03 LAB — CBC
HCT: 39.6 % (ref 36.0–46.0)
Hemoglobin: 12.8 g/dL (ref 12.0–15.0)
MCH: 27.2 pg (ref 26.0–34.0)
MCHC: 32.3 g/dL (ref 30.0–36.0)
MCV: 84.3 fL (ref 78.0–100.0)
RDW: 13.3 % (ref 11.5–15.5)

## 2012-03-03 LAB — CREATININE, URINE, RANDOM: Creatinine, Urine: 24.7 mg/dL

## 2012-03-03 LAB — MAGNESIUM: Magnesium: 1.8 mg/dL (ref 1.5–2.5)

## 2012-03-03 MED ORDER — OSELTAMIVIR PHOSPHATE 75 MG PO CAPS
75.0000 mg | ORAL_CAPSULE | Freq: Two times a day (BID) | ORAL | Status: AC
Start: 2012-03-03 — End: 2012-03-07
  Administered 2012-03-03 – 2012-03-07 (×10): 75 mg via ORAL
  Filled 2012-03-03 (×10): qty 1

## 2012-03-03 MED ORDER — POTASSIUM CHLORIDE CRYS ER 20 MEQ PO TBCR
40.0000 meq | EXTENDED_RELEASE_TABLET | Freq: Two times a day (BID) | ORAL | Status: AC
  Administered 2012-03-03 (×2): 40 meq via ORAL
  Filled 2012-03-03 (×2): qty 2

## 2012-03-03 MED ORDER — HYDROCHLOROTHIAZIDE 12.5 MG PO CAPS
12.5000 mg | ORAL_CAPSULE | Freq: Every day | ORAL | Status: DC
  Administered 2012-03-03 – 2012-03-08 (×6): 12.5 mg via ORAL
  Filled 2012-03-03 (×6): qty 1

## 2012-03-03 MED ORDER — OSELTAMIVIR PHOSPHATE 75 MG PO CAPS: 75.0000 mg | ORAL_CAPSULE | Freq: Two times a day (BID) | ORAL | Status: DC

## 2012-03-03 NOTE — Progress Notes (Signed)
Patient ID: [name], female   DOB: 11/09/1950, 62 y.o.   MRN: [id number]  [facility] PROGRESS NOTE  [name] QMV:[id number] DOB: May 28, 1950 DOA: 03/02/2012 PCP: Elby Showers, MD  Brief narrative: Pt is 62 yo female who was seen in an outpatient office ([facility]) with nausea, vomiting, fevers, chills, shortness of breath, cough productive of yellow sputum, generalized weakness. Influenza swab + for H. Flu and pt sent to [facility] ED for further management.   Active Problems:  Shortness of breath, acute hypoxic respiratory failure - likely secondary to viral illness imposed on COPD, pt is clinically stable this AM and reports feeling better - Influenza panel still pending - will continue empiric ABX Levaquin and antiviral Tamiflu until final results are base - will continue to provide supportive care  Hypertension - reasonable control during the hospital stay  COPD exacerbation - pt maintaining oxygen saturations at target range - will continue supportive care with nebulizers as needed  Hyponatremia - secondary to dehydration - provide IVF and obtain BMP in AM  Hypokalemia - will continue to supplement as indicated - BMP in AM  Consultants:  None  Procedures/Studies: Dg Chest 2 View 03/02/2012   1. No radiographic evidence of acute cardiopulmonary disease.  2.  Atherosclerosis.     Antibiotics:  Levaquin 1/11 -->  Tamiflu 1/11 -->  Code Status: Full Family Communication: Pt at bedside Disposition Plan: Home when medically stable  HPI/Subjective: No events overnight.   Objective: Filed Vitals:   03/03/12 0130 03/03/12 0300 03/03/12 0625 03/03/12 1414  BP:   119/75 113/75  Pulse:   81 63  Temp:  98.6 F (37 C) 98.3 F (36.8 C) 97.6 F (36.4 C)  TempSrc:   Oral Oral  Resp:   18 18  Height:      Weight:      SpO2: 95%  94% 97%    Intake/Output Summary (Last 24 hours) at 03/03/12 1740 Last data filed at 03/03/12 1400  Gross per 24 hour   Intake 1658.75 ml  Output    400 ml  Net 1258.75 ml    Exam:   General:  Pt is alert, follows commands appropriately, not in acute distress  Cardiovascular: Regular rate and rhythm, S1/S2, no murmurs, no rubs, no gallops  Respiratory: Clear to auscultation bilaterally, no wheezing, no crackles, no rhonchi  Abdomen: Soft, non tender, non distended, bowel sounds present, no guarding  Extremities: No edema, pulses DP and PT palpable bilaterally  Neuro: Grossly nonfocal  Data Reviewed: Basic Metabolic Panel:  Lab 03/03/12 2841 03/02/12 1654  NA 132* 130*  K 3.0* 3.3*  CL 95* 89*  CO2 25 28  GLUCOSE 96 92  BUN 12 17  CREATININE 0.67 0.80  CALCIUM 8.1* 9.0  MG 1.8 --  PHOS 2.3 --   Liver Function Tests:  Lab 03/03/12 0600  AST 70*  ALT 29  ALKPHOS 73  BILITOT 0.6  PROT 5.6*  ALBUMIN 2.6*   CBC:  Lab 03/03/12 0600 03/02/12 1654  WBC 6.3 9.2  NEUTROABS -- 7.5  HGB 12.8 14.5  HCT 39.6 43.2  MCV 84.3 84.4  PLT 137* 176   Scheduled Meds:   . ALPRAZolam  0.5 mg Oral QHS  . aspirin  325 mg Oral Daily  . docusate sodium  100 mg Oral BID  . enoxaparin (LOVENOX) injection  40 mg Subcutaneous QHS  . escitalopram  5 mg Oral Daily  . guaiFENesin  600 mg Oral BID  .  hydrochlorothiazide  12.5 mg Oral Daily  . ipratropium  0.5 mg Nebulization Q6H  . levofloxacin (LEVAQUIN) IV  500 mg Intravenous QHS  . metoprolol succinate  150 mg Oral Daily  . oseltamivir  75 mg Oral BID  . pantoprazole  40 mg Oral Daily  . potassium chloride  40 mEq Oral BID  . sodium chloride  3 mL Intravenous Q12H   Continuous Infusions:   . 0.9 % NaCl with KCl 20 mEq / L 1,000 mL (03/02/12 2257)     Debbora Presto, MD  [facility] Pager [phone]  If 7PM-7AM, please contact night-coverage [email or web address] Password [email or web address] 03/03/2012, 5:40 PM   LOS: 1 day

## 2012-03-03 NOTE — Progress Notes (Signed)
Utilization review completed.  

## 2012-03-04 LAB — CBC
MCH: 27.3 pg (ref 26.0–34.0)
MCHC: 32 g/dL (ref 30.0–36.0)
Platelets: 146 10*3/uL — ABNORMAL LOW (ref 150–400)
RDW: 13.4 % (ref 11.5–15.5)

## 2012-03-04 LAB — INFLUENZA PANEL BY PCR (TYPE A & B)
H1N1 flu by pcr: DETECTED — AB
Influenza B By PCR: NEGATIVE

## 2012-03-04 LAB — BASIC METABOLIC PANEL
Calcium: 8.4 mg/dL (ref 8.4–10.5)
GFR calc Af Amer: >90 mL/min (ref >90)
GFR calc non Af Amer: >90 mL/min (ref >90)
Sodium: 132 mEq/L — ABNORMAL LOW (ref 135–145)

## 2012-03-04 MED ORDER — MAGIC MOUTHWASH: 10.0000 mL | ORAL | Status: DC | PRN

## 2012-03-04 MED ORDER — LEVOFLOXACIN 500 MG PO TABS
500.0000 mg | ORAL_TABLET | ORAL | Status: DC
  Administered 2012-03-04: 500 mg via ORAL
  Filled 2012-03-04 (×2): qty 1

## 2012-03-04 MED ORDER — GUAIFENESIN ER 600 MG PO TB12
1200.0000 mg | ORAL_TABLET | Freq: Two times a day (BID) | ORAL | Status: DC
  Administered 2012-03-04 – 2012-03-08 (×8): 1200 mg via ORAL
  Filled 2012-03-04 (×9): qty 2

## 2012-03-04 NOTE — Progress Notes (Signed)
PHARMACIST - PHYSICIAN COMMUNICATION CONCERNING: Antibiotic IV to Oral Route Change Policy  RECOMMENDATION: This patient is receiving Levaquin by the intravenous route.  Based on criteria approved by the Pharmacy and Therapeutics Committee, the antibiotic(s) is/are being converted to the equivalent oral dose form(s).   DESCRIPTION: These criteria include:  Patient being treated for a respiratory tract infection, urinary tract infection, or cellulitis  The patient is not neutropenic and does not exhibit a GI malabsorption state  The patient is eating (either orally or via tube) and/or has been taking other orally administered medications for a least 24 hours  The patient is improving clinically and has a Tmax < 100.5  If you have questions about this conversion, please contact the Pharmacy Department  []   [phone] )  [facility] []   [phone] )  [facility]  []   [phone] )  [facility] [x]   [phone] )  [facility]   Geoffry Paradise, PharmD, BCPS Pager: [phone] 2:14 PM Pharmacy #: [id number]

## 2012-03-04 NOTE — Progress Notes (Signed)
Patient ID: [name], female   DOB: 1950/04/12, 62 y.o.   MRN: [id number]  [facility] PROGRESS NOTE  [name]:[id number] DOB: [birth year] DOA: 03/02/2012 PCP: Elby Showers, MD  Brief narrative:  Pt is 62 yo female who was seen in an outpatient office ([facility]) with nausea, vomiting, fevers, chills, shortness of breath, cough productive of yellow sputum, generalized weakness. Influenza swab + for H. Flu and pt sent to [facility] ED for further management.   Active Problems:  Shortness of breath, acute hypoxic respiratory failure  - likely secondary to viral illness imposed on COPD, pt is clinically stable this AM and reports feeling better  - Influenza panel still pending  - will continue empiric ABX Levaquin and antiviral Tamiflu until final results are base  - will continue to provide supportive care  - Patient maintaining oxygen saturations at target range, remains afebrile over 48 hour period, no leukocytosis Hypertension  - reasonable control during the hospital stay  COPD exacerbation  - pt maintaining oxygen saturations at target range  - will continue supportive care with nebulizers as needed  Hyponatremia  - secondary to dehydration  - provide IVF and obtain BMP in AM  Hypokalemia  - will continue to supplement as indicated  - Potassium is within normal limits this morning - BMP in AM  Diarrhea - Persistent and watery per patient - Will order C. difficile by PCR  Consultants:  None Procedures/Studies:  Dg Chest 2 View  03/02/2012  1. No radiographic evidence of acute cardiopulmonary disease.  2. Atherosclerosis.  Antibiotics:  Levaquin 1/11 -->  Tamiflu 1/11 --> Code Status: Full  Family Communication: Pt at bedside  Disposition Plan: Home when medically stable   HPI/Subjective: No events overnight.   Objective: Filed Vitals:   03/03/12 2251 03/04/12 0227 03/04/12 0657 03/04/12 0830  BP: 120/81  111/70   Pulse: 75  72   Temp: 98 F  (36.7 C)  98.3 F (36.8 C)   TempSrc: Oral  Oral   Resp: 18  18   Height:      Weight:      SpO2: 97% 93% 95% 93%    Intake/Output Summary (Last 24 hours) at 03/04/12 1549 Last data filed at 03/04/12 0600  Gross per 24 hour  Intake   1300 ml  Output      0 ml  Net   1300 ml    Exam:   General:  Pt is alert, follows commands appropriately, not in acute distress  Cardiovascular: Regular rate and rhythm, S1/S2, no murmurs, no rubs, no gallops  Respiratory: Clear to auscultation bilaterally, no wheezing, rales at bases   Abdomen: Soft, non tender, non distended, bowel sounds present, no guarding  Extremities: No edema, pulses DP and PT palpable bilaterally  Neuro: Grossly nonfocal  Data Reviewed: Basic Metabolic Panel:  Lab 03/04/12 5784 03/03/12 0600 03/02/12 1654  NA 132* 132* 130*  K 4.6 3.0* 3.3*  CL 99 95* 89*  CO2 25 25 28   GLUCOSE 94 96 92  BUN 8 12 17   CREATININE 0.67 0.67 0.80  CALCIUM 8.4 8.1* 9.0  MG -- 1.8 --  PHOS -- 2.3 --   Liver Function Tests:  Lab 03/03/12 0600  AST 70*  ALT 29  ALKPHOS 73  BILITOT 0.6  PROT 5.6*  ALBUMIN 2.6*   CBC:  Lab 03/04/12 0510 03/03/12 0600 03/02/12 1654  WBC 5.0 6.3 9.2  NEUTROABS -- -- 7.5  HGB 12.7 12.8 14.5  HCT 39.7 39.6 43.2  MCV 85.2 84.3 84.4  PLT 146* 137* 176   Scheduled Meds:   . ALPRAZolam  0.5 mg Oral QHS  . aspirin  325 mg Oral Daily  . docusate sodium  100 mg Oral BID  . escitalopram  5 mg Oral Daily  . guaiFENesin  1,200 mg Oral BID  . hydrochlorothiazide  12.5 mg Oral Daily  . ipratropium  0.5 mg Nebulization Q6H  . levofloxacin  500 mg Oral Q24H  . metoprolol succinate  150 mg Oral Daily  . oseltamivir  75 mg Oral BID  . pantoprazole  40 mg Oral Daily  . sodium chloride  3 mL Intravenous Q12H   Continuous Infusions:   . 0.9 % NaCl with KCl 20 mEq / L 75 mL/hr at 03/03/12 1900     Debbora Presto, MD  [facility] Pager [phone]  If 7PM-7AM, please contact  night-coverage [email or web address] Password [email or web address] 03/04/2012, 3:49 PM   LOS: 2 days

## 2012-03-04 NOTE — Progress Notes (Signed)
Patient complains of loose stools after eating.  Patient stated that she had loose stools all day yesterday as well.  Dr. Izola Price made aware.  Patient placed on isolation to rule out cdiff.  Patient is aware that we need a stool sample.  Will continue to monitor.

## 2012-03-05 LAB — BASIC METABOLIC PANEL
Chloride: 99 mEq/L (ref 96–112)
Creatinine, Ser: 0.65 mg/dL (ref 0.50–1.10)
GFR calc Af Amer: >90 mL/min (ref >90)
Potassium: 4.1 mEq/L (ref 3.5–5.1)
Sodium: 134 mEq/L — ABNORMAL LOW (ref 135–145)

## 2012-03-05 LAB — CBC
MCV: 86 fL (ref 78.0–100.0)
Platelets: 146 10*3/uL — ABNORMAL LOW (ref 150–400)
RDW: 13.4 % (ref 11.5–15.5)
WBC: 4.9 10*3/uL (ref 4.0–10.5)

## 2012-03-05 LAB — CLOSTRIDIUM DIFFICILE BY PCR: Toxigenic C. Difficile by PCR: NEGATIVE

## 2012-03-05 MED ORDER — ALBUTEROL SULFATE (5 MG/ML) 0.5% IN NEBU
2.5000 mg | INHALATION_SOLUTION | Freq: Four times a day (QID) | RESPIRATORY_TRACT | Status: DC | PRN
  Administered 2012-03-05 – 2012-03-07 (×5): 2.5 mg via RESPIRATORY_TRACT
  Filled 2012-03-05 (×5): qty 0.5

## 2012-03-05 MED ORDER — IPRATROPIUM BROMIDE 0.02 % IN SOLN
0.5000 mg | Freq: Four times a day (QID) | RESPIRATORY_TRACT | Status: DC | PRN
  Administered 2012-03-05 – 2012-03-07 (×4): 0.5 mg via RESPIRATORY_TRACT
  Filled 2012-03-05 (×5): qty 2.5

## 2012-03-05 NOTE — Progress Notes (Signed)
Patient ID: [name], female   DOB: 10/18/1950, 62 y.o.   MRN: [id number]  [facility] PROGRESS NOTE  [name] NWG:[id number] DOB: 07/12/1950 DOA: 03/02/2012 PCP: Elby Showers, MD  Brief narrative:  Pt is 62 yo female who was seen in an outpatient office ([facility]) with nausea, vomiting, fevers, chills, shortness of breath, cough productive of yellow sputum, generalized weakness. Influenza swab + for H. Flu and pt sent to [facility] ED for further management.   Active Problems:  Shortness of breath, acute hypoxic respiratory failure  - likely secondary to viral illness imposed on COPD, pt is clinically stable this AM and reports feeling better  - Influenza panel + H1N1, will continue Tamiflu and will plan on discontinuing Levaquin today  - will continue to provide supportive care  - Patient maintaining oxygen saturations at target range, remains afebrile over 48 hour period, no leukocytosis  Hypertension  - reasonable control during the hospital stay  COPD exacerbation  - pt maintaining oxygen saturations at target range  - will continue supportive care with nebulizers as needed  - will check oxygen level with ambulation as pt has has exertional SOB  Hyponatremia  - secondary to dehydration  - improving - will d/c IVF and encourage PO intake  Hypokalemia  - will continue to supplement as indicated  - Potassium is within normal limits this morning  - BMP in AM  Diarrhea  - now resolved, C. Diff by PCR negative  Consultants:  None Procedures/Studies:  Dg Chest 2 View  03/02/2012  1. No radiographic evidence of acute cardiopulmonary disease.  2. Atherosclerosis.  Antibiotics:  Levaquin 1/11 --> 1/14 Tamiflu 1/11 --> Code Status: Full  Family Communication: Pt at bedside  Disposition Plan: Home when medically stable, likely 1-2 days   HPI/Subjective: No events overnight.   Objective: Filed Vitals:   03/04/12 2100 03/05/12 0549 03/05/12 0914 03/05/12  1748  BP: 114/76 132/77  112/75  Pulse: 73 80  72  Temp: 97.8 F (36.6 C) 98.4 F (36.9 C)  98.5 F (36.9 C)  TempSrc: Oral Oral  Oral  Resp: 20 20  22   Height:      Weight:      SpO2: 97% 98% 93% 93%    Intake/Output Summary (Last 24 hours) at 03/05/12 1755 Last data filed at 03/05/12 0230  Gross per 24 hour  Intake      0 ml  Output      0 ml  Net      0 ml    Exam:   General:  Pt is alert, follows commands appropriately, not in acute distress  Cardiovascular: Regular rate and rhythm, S1/S2, no murmurs, no rubs, no gallops  Respiratory: Clear to auscultation bilaterally, no wheezing, no crackles, no rhonchi  Abdomen: Soft, non tender, non distended, bowel sounds present, no guarding  Extremities: No edema, pulses DP and PT palpable bilaterally  Neuro: Grossly nonfocal  Data Reviewed: Basic Metabolic Panel:  Lab 03/05/12 5784 03/04/12 0510 03/03/12 0600 03/02/12 1654  NA 134* 132* 132* 130*  K 4.1 4.6 3.0* 3.3*  CL 99 99 95* 89*  CO2 27 25 25 28   GLUCOSE 95 94 96 92  BUN 8 8 12 17   CREATININE 0.65 0.67 0.67 0.80  CALCIUM 8.5 8.4 8.1* 9.0  MG -- -- 1.8 --  PHOS -- -- 2.3 --   Liver Function Tests:  Lab 03/03/12 0600  AST 70*  ALT 29  ALKPHOS 73  BILITOT 0.6  PROT 5.6*  ALBUMIN 2.6*   No results found for this basename: LIPASE:5,AMYLASE:5 in the last 168 hours No results found for this basename: AMMONIA:5 in the last 168 hours CBC:  Lab 03/05/12 0525 03/04/12 0510 03/03/12 0600 03/02/12 1654  WBC 4.9 5.0 6.3 9.2  NEUTROABS -- -- -- 7.5  HGB 13.0 12.7 12.8 14.5  HCT 41.0 39.7 39.6 43.2  MCV 86.0 85.2 84.3 84.4  PLT 146* 146* 137* 176     Recent Results (from the past 240 hour(s))  CLOSTRIDIUM DIFFICILE BY PCR     Status: Normal   Collection Time   03/05/12 10:43 AM      Component Value Range Status Comment   C difficile by pcr NEGATIVE  NEGATIVE Final      Scheduled Meds:   . ALPRAZolam  0.5 mg Oral QHS  . aspirin  325 mg Oral Daily    . docusate sodium  100 mg Oral BID  . escitalopram  5 mg Oral Daily  . guaiFENesin  1,200 mg Oral BID  . hydrochlorothiazide  12.5 mg Oral Daily  . levofloxacin  500 mg Oral Q24H  . metoprolol succinate  150 mg Oral Daily  . oseltamivir  75 mg Oral BID  . pantoprazole  40 mg Oral Daily  . sodium chloride  3 mL Intravenous Q12H   Continuous Infusions:   . 0.9 % NaCl with KCl 20 mEq / L 75 mL/hr at 03/05/12 0981     Amber Presto, MD  [facility] Pager [phone]  If 7PM-7AM, please contact night-coverage [email or web address] Password [email or web address] 03/05/2012, 5:55 PM   LOS: 3 days

## 2012-03-05 NOTE — Evaluation (Signed)
Physical Therapy One Time Evaluation Patient Details Name: [name] MRN: [id number] DOB: 05/29/1950 Today's Date: 03/05/2012 Time: 4098-1191 PT Time Calculation (min): 17 min  PT Assessment / Plan / Recommendation Clinical Impression  Pt presents with N/V, SOB and COPD exacerbation.  Tolerated OOB and ambulation in hallway on RA, however upon returning to room, noted that SaO2 was 84%.  Encouraged pursed lip breathing with O2 sats increasing to 94% quickly.  RN notified.  (Note that pt was initially on 2L O2 in room with O2 sats at 98% prior to amb).  Pt will not require any further follow up in hospital or at home.  Encouraged pt to ambulate with nursing and notified RN to monitor O2 sats.  PT will sign off on pt at this time.      PT Assessment  Patent does not need any further PT services    Follow Up Recommendations  No PT follow up    Does the patient have the potential to tolerate intense rehabilitation      Barriers to Discharge        Equipment Recommendations  None recommended by PT    Recommendations for Other Services     Frequency      Precautions / Restrictions Precautions Precautions: None Restrictions Weight Bearing Restrictions: No   Pertinent Vitals/Pain No pain initially, some pain in chest from coughing.       Mobility  Bed Mobility Bed Mobility: Supine to Sit Supine to Sit: 6: Modified independent (Device/Increase time) Details for Bed Mobility Assistance: increased time.  she states she has pain in chest from coughing.  Transfers Transfers: Sit to Stand;Stand to Sit Sit to Stand: 6: Modified independent (Device/Increase time);From bed Stand to Sit: 6: Modified independent (Device/Increase time);To chair/3-in-1 Details for Transfer Assistance: Increased time Ambulation/Gait Ambulation/Gait Assistance: 6: Modified independent (Device/Increase time) Ambulation Distance (Feet): 200 Feet Assistive device: None Ambulation/Gait Assistance  Details: Noted no LOB or instability with ambulation, however did note SOB and 2/3 DOE.  Pt initially on 2L O2 in room with O2 sats at 98%.  Ambulated on RA with SaO2 at 84%, encouraged pursed lip breathing with O2 sats rising to 94% quickly.  RN notified and left O2 off of pt.  Gait Pattern: Within Functional Limits Gait velocity: somewhat decreased due to SOB Stairs: No Wheelchair Mobility Wheelchair Mobility: No    Shoulder Instructions     Exercises     PT Diagnosis:    PT Problem List:   PT Treatment Interventions:     PT Goals    Visit Information  Last PT Received On: 03/05/12 Assistance Needed: +1    Subjective Data  Subjective: I just want to know what is going on with me.  Patient Stated Goal: to get back home.    Prior Functioning  Home Living Lives With: Alone Type of Home: Other (Comment) (condo) Home Access: Stairs to enter Entrance Stairs-Number of Steps: flight Entrance Stairs-Rails: Right Home Layout: One level Bathroom Shower/Tub: Engineer, manufacturing systems: Standard Home Adaptive Equipment: None Prior Function Level of Independence: Independent Able to Take Stairs?: Yes Driving: Yes Vocation: Retired Musician: No difficulties    Cognition  Overall Cognitive Status: Appears within functional limits for tasks assessed/performed Arousal/Alertness: Awake/alert Orientation Level: Appears intact for tasks assessed Behavior During Session: Agitated Cognition - Other Comments: Somewhat agitated about enteric and droplet precautions.     Extremity/Trunk Assessment Right Lower Extremity Assessment RLE ROM/Strength/Tone: WFL for tasks assessed RLE Sensation: WFL -  Light Touch RLE Coordination: WFL - gross/fine motor Left Lower Extremity Assessment LLE ROM/Strength/Tone: WFL for tasks assessed LLE Sensation: WFL - Light Touch LLE Coordination: WFL - gross/fine motor Trunk Assessment Trunk Assessment: Normal   Balance    End  of Session PT - End of Session Activity Tolerance: Patient limited by fatigue Patient left: in chair;with call bell/phone within reach;with nursing in room Nurse Communication: Mobility status  GP     Vista Deck 03/05/2012, 9:48 AM

## 2012-03-06 LAB — CBC
HCT: 38.7 % (ref 36.0–46.0)
RBC: 4.55 MIL/uL (ref 3.87–5.11)
RDW: 13.4 % (ref 11.5–15.5)
WBC: 5.3 10*3/uL (ref 4.0–10.5)

## 2012-03-06 LAB — BASIC METABOLIC PANEL
BUN: 7 mg/dL (ref 6–23)
Chloride: 99 mEq/L (ref 96–112)
GFR calc Af Amer: >90 mL/min (ref >90)
Potassium: 3.5 mEq/L (ref 3.5–5.1)

## 2012-03-06 MED ORDER — PREDNISONE 10 MG PO TABS
60.0000 mg | ORAL_TABLET | Freq: Every day | ORAL | Status: DC
  Administered 2012-03-06 – 2012-03-07 (×2): 60 mg via ORAL
  Filled 2012-03-06 (×3): qty 1

## 2012-03-06 MED ORDER — POTASSIUM CHLORIDE CRYS ER 20 MEQ PO TBCR
40.0000 meq | EXTENDED_RELEASE_TABLET | Freq: Once | ORAL | Status: AC
  Administered 2012-03-06: 40 meq via ORAL
  Filled 2012-03-06: qty 2

## 2012-03-06 NOTE — Progress Notes (Signed)
Monitored O2 sats while ambulating.  Before getting oob, took O2 off and O2 sats dropped to 87%.  While ambulating without O2 to nurses' station and back, pt's O2 stayed between 79% & 87%.  It stayed between 82% & 87% majority of the time.  It dropped to 79% for a couple of seconds only.

## 2012-03-06 NOTE — Progress Notes (Signed)
[facility] PROGRESS NOTE  MILIA WARTH ZOX:[id number] DOB: 05/15/1950 DOA: 03/02/2012  PCP: Elby Showers, MD  Brief HPI: Pt is 62 yo female who was seen in an outpatient office ([facility]) with nausea, vomiting, fevers, chills, shortness of breath, cough productive of yellow sputum, generalized weakness. Influenza swab + and pt sent to [facility] ED for further management. Patient was subsequently admitted.  Past medical history:  Past Medical History  Diagnosis Date  . Hepatitis C   . Hypertension   . Polycythemia 2009  . CAD (coronary artery disease)   . Arthritis   . GERD (gastroesophageal reflux disease)     Consultants: None  Procedures: None  Antibiotics: Levaquin till 1/14 Tamiflu  Subjective: Patient feels better. Still coughing up yellow sputum. Breathing is better. Admits to smoking in past. Has about 3 pack year history of smoking. No pain.  Objective: Vital Signs  Filed Vitals:   03/05/12 2028 03/05/12 2200 03/06/12 0600 03/06/12 1047  BP:  131/67 131/58   Pulse:  83 70   Temp:  98.8 F (37.1 C) 98.4 F (36.9 C)   TempSrc:  Oral Oral   Resp:  20 20   Height:      Weight:      SpO2: 90% 93% 96% 92%    Intake/Output Summary (Last 24 hours) at 03/06/12 1152 Last data filed at 03/05/12 2200  Gross per 24 hour  Intake    360 ml  Output      0 ml  Net    360 ml   Filed Weights   03/02/12 1643  Weight: 66.679 kg (147 lb)    General appearance: alert, cooperative, appears stated age and no distress Head: Normocephalic, without obvious abnormality, atraumatic Neck: no adenopathy, no carotid bruit, no JVD, supple, symmetrical, trachea midline and thyroid not enlarged, symmetric, no tenderness/mass/nodules Back: symmetric, no curvature. ROM normal. No CVA tenderness. Resp: End exp wheezing bilaterally. Few crackles at the bases.  Cardio: regular rate and rhythm, S1, S2 normal, no murmur, click, rub or gallop Extremities: extremities normal,  atraumatic, no cyanosis or edema Pulses: 2+ and symmetric Skin: Skin color, texture, turgor normal. No rashes or lesions Neurologic: Alert and oriented x 3. No focal deficits.  Lab Results:  Basic Metabolic Panel:  Lab 03/06/12 8119 03/05/12 0525 03/04/12 0510 03/03/12 0600 03/02/12 1654  NA 136 134* 132* 132* 130*  K 3.5 4.1 4.6 3.0* 3.3*  CL 99 99 99 95* 89*  CO2 28 27 25 25 28   GLUCOSE 99 95 94 96 92  BUN 7 8 8 12 17   CREATININE 0.65 0.65 0.67 0.67 0.80  CALCIUM 8.6 8.5 8.4 8.1* 9.0  MG -- -- -- 1.8 --  PHOS -- -- -- 2.3 --   Liver Function Tests:  Lab 03/03/12 0600  AST 70*  ALT 29  ALKPHOS 73  BILITOT 0.6  PROT 5.6*  ALBUMIN 2.6*   CBC:  Lab 03/06/12 0510 03/05/12 0525 03/04/12 0510 03/03/12 0600 03/02/12 1654  WBC 5.3 4.9 5.0 6.3 9.2  NEUTROABS -- -- -- -- 7.5  HGB 12.5 13.0 12.7 12.8 14.5  HCT 38.7 41.0 39.7 39.6 43.2  MCV 85.1 86.0 85.2 84.3 84.4  PLT 161 146* 146* 137* 176    Recent Results (from the past 240 hour(s))  CLOSTRIDIUM DIFFICILE BY PCR     Status: Normal   Collection Time   03/05/12 10:43 AM      Component Value Range Status Comment   C difficile  by pcr NEGATIVE  NEGATIVE Final      Studies/Results: No results found.  Medications:  Scheduled:    . ALPRAZolam  0.5 mg Oral QHS  . aspirin  325 mg Oral Daily  . docusate sodium  100 mg Oral BID  . escitalopram  5 mg Oral Daily  . guaiFENesin  1,200 mg Oral BID  . hydrochlorothiazide  12.5 mg Oral Daily  . metoprolol succinate  150 mg Oral Daily  . oseltamivir  75 mg Oral BID  . pantoprazole  40 mg Oral Daily  . predniSONE  60 mg Oral Q breakfast  . sodium chloride  3 mL Intravenous Q12H   Continuous:  JXB:[id number], acetaminophen, albuterol, guaiFENesin-dextromethorphan, HYDROcodone-acetaminophen, ipratropium, morphine injection, ondansetron (ZOFRAN) IV, ondansetron  Assessment/Plan:  Principal Problem:  *COPD exacerbation Active Problems:  Hypertension  Influenza   Hypoxia  Hyponatremia  Hypokalemia    Acute COPD exacerbation with acute hypoxic respiratory failure  Likely secondary to viral illness imposed on COPD. Doing well on nebulizer treatment. Will add steroids. Patient is agreeable. Check RA sats.   Influenza/H1N1 On Tamiflu. Afebrile.   Hypertension  Reasonable control during the hospital stay   Hyponatremia  Resolved. Was secondary to dehydration   Hypokalemia  Stable. Give KCL today.   Diarrhea  Now resolved, C. Diff by PCR negative   DVT Prophylaxis Initiate SCD's  Code Status: Full  Family Communication: Pt at bedside  Disposition Plan: Home when medically stable, likely 1/16 AM.   LOS: 4 days   [facility]  [facility] Pager [phone] 03/06/2012, 11:52 AM  If 8PM-8AM, please contact night-coverage at [email or web address], password [facility]

## 2012-03-07 MED ORDER — ALBUTEROL SULFATE (5 MG/ML) 0.5% IN NEBU
2.5000 mg | INHALATION_SOLUTION | Freq: Four times a day (QID) | RESPIRATORY_TRACT | Status: DC
  Administered 2012-03-07 – 2012-03-08 (×6): 2.5 mg via RESPIRATORY_TRACT
  Filled 2012-03-07 (×6): qty 0.5

## 2012-03-07 MED ORDER — PREDNISONE 50 MG PO TABS
60.0000 mg | ORAL_TABLET | Freq: Two times a day (BID) | ORAL | Status: DC
  Administered 2012-03-07 – 2012-03-08 (×2): 60 mg via ORAL
  Filled 2012-03-07 (×4): qty 1

## 2012-03-07 NOTE — Progress Notes (Signed)
Pt had an episode of night sweats. Juquan Reznick RN

## 2012-03-07 NOTE — Progress Notes (Signed)
[facility] PROGRESS NOTE  [name] ZOX:[id number] DOB: 04/21/1950 DOA: 03/02/2012  PCP: Elby Showers, MD  Brief HPI: Pt is 62 yo female who was seen in an outpatient office ([facility]) with nausea, vomiting, fevers, chills, shortness of breath, cough productive of yellow sputum, generalized weakness. Influenza swab + and pt sent to [facility] ED for further management. Patient was subsequently admitted.  Past medical history:  Past Medical History  Diagnosis Date  . Hepatitis C   . Hypertension   . Polycythemia 2009  . CAD (coronary artery disease)   . Arthritis   . GERD (gastroesophageal reflux disease)     Consultants: None  Procedures: None  Antibiotics: Levaquin till 1/14 Tamiflu  Subjective: Patient still wheezing. Feels some better compared to yesterday. Dry cough. Ambulated yesterday with drop in O2 sats to early 80's.  Objective: Vital Signs  Filed Vitals:   03/06/12 1824 03/06/12 2200 03/07/12 0556 03/07/12 0615  BP:  124/65  155/69  Pulse:  86  57  Temp:  99.3 F (37.4 C)  97.7 F (36.5 C)  TempSrc:  Oral  Oral  Resp:  20 20 18   Height:      Weight:      SpO2: 95% 91%  98%    Intake/Output Summary (Last 24 hours) at 03/07/12 0845 Last data filed at 03/06/12 1500  Gross per 24 hour  Intake    120 ml  Output      0 ml  Net    120 ml   Filed Weights   03/02/12 1643  Weight: 66.679 kg (147 lb)    General appearance: alert, cooperative, appears stated age and no distress Neck: no adenopathy, no carotid bruit, no JVD, supple, symmetrical, trachea midline and thyroid not enlarged, symmetric, no tenderness/mass/nodules Back: symmetric, no curvature. ROM normal. No CVA tenderness. Resp: End exp wheezing bilaterally. Few crackles at the bases. Improved compared to yesterday. Cardio: regular rate and rhythm, S1, S2 normal, no murmur, click, rub or gallop Extremities: extremities normal, atraumatic, no cyanosis or edema Pulses: 2+ and  symmetric Skin: Skin color, texture, turgor normal. No rashes or lesions Neurologic: Alert and oriented x 3. No focal deficits.  Lab Results:  Basic Metabolic Panel:  Lab 03/06/12 8119 03/05/12 0525 03/04/12 0510 03/03/12 0600 03/02/12 1654  NA 136 134* 132* 132* 130*  K 3.5 4.1 4.6 3.0* 3.3*  CL 99 99 99 95* 89*  CO2 28 27 25 25 28   GLUCOSE 99 95 94 96 92  BUN 7 8 8 12 17   CREATININE 0.65 0.65 0.67 0.67 0.80  CALCIUM 8.6 8.5 8.4 8.1* 9.0  MG -- -- -- 1.8 --  PHOS -- -- -- 2.3 --   Liver Function Tests:  Lab 03/03/12 0600  AST 70*  ALT 29  ALKPHOS 73  BILITOT 0.6  PROT 5.6*  ALBUMIN 2.6*   CBC:  Lab 03/06/12 0510 03/05/12 0525 03/04/12 0510 03/03/12 0600 03/02/12 1654  WBC 5.3 4.9 5.0 6.3 9.2  NEUTROABS -- -- -- -- 7.5  HGB 12.5 13.0 12.7 12.8 14.5  HCT 38.7 41.0 39.7 39.6 43.2  MCV 85.1 86.0 85.2 84.3 84.4  PLT 161 146* 146* 137* 176    Recent Results (from the past 240 hour(s))  CLOSTRIDIUM DIFFICILE BY PCR     Status: Normal   Collection Time   03/05/12 10:43 AM      Component Value Range Status Comment   C difficile by pcr NEGATIVE  NEGATIVE Final  Studies/Results: No results found.  Medications:  Scheduled:    . ALPRAZolam  0.5 mg Oral QHS  . aspirin  325 mg Oral Daily  . docusate sodium  100 mg Oral BID  . escitalopram  5 mg Oral Daily  . guaiFENesin  1,200 mg Oral BID  . hydrochlorothiazide  12.5 mg Oral Daily  . metoprolol succinate  150 mg Oral Daily  . oseltamivir  75 mg Oral BID  . pantoprazole  40 mg Oral Daily  . predniSONE  60 mg Oral Q breakfast  . sodium chloride  3 mL Intravenous Q12H   Continuous:  ONG:[id number], acetaminophen, albuterol, guaiFENesin-dextromethorphan, HYDROcodone-acetaminophen, ipratropium, morphine injection, ondansetron (ZOFRAN) IV, ondansetron  Assessment/Plan:  Principal Problem:  *COPD exacerbation Active Problems:  Hypertension  Influenza  Hypoxia  Hyponatremia  Hypokalemia    Acute  COPD exacerbation with acute hypoxic respiratory failure  Likely secondary to viral illness imposed on COPD. Doing well on nebulizer treatment. Will increase steroids. Check RA sats daily. Might need to go home with oxygen for short term.  Influenza/H1N1 On Tamiflu. Afebrile.   Hypertension  Reasonably well controlled.   Hyponatremia  Resolved. Was secondary to dehydration   Hypokalemia  Stable. Give KCL today.   Diarrhea  Now resolved, C. Diff by PCR negative   DVT Prophylaxis Initiate SCD's  Code Status: Full  Family Communication: Pt at bedside  Disposition Plan: Home when medically stable. Hopefully in 1-2 days.    LOS: 5 days   [facility]  [facility] Pager [phone] 03/07/2012, 8:45 AM  If 8PM-8AM, please contact night-coverage at [email or web address], password [email or web address]

## 2012-03-08 ENCOUNTER — Other Ambulatory Visit: Payer: 59 | Admitting: Lab

## 2012-03-08 ENCOUNTER — Ambulatory Visit: Payer: 59 | Admitting: Hematology & Oncology

## 2012-03-08 MED ORDER — ALBUTEROL SULFATE HFA 108 (90 BASE) MCG/ACT IN AERS: 2.0000 | INHALATION_SPRAY | Freq: Four times a day (QID) | RESPIRATORY_TRACT | Status: DC | PRN

## 2012-03-08 MED ORDER — GUAIFENESIN-DM 100-10 MG/5ML PO SYRP: 5.0000 mL | ORAL_SOLUTION | ORAL | Status: DC | PRN

## 2012-03-08 MED ORDER — ALBUTEROL SULFATE (5 MG/ML) 0.5% IN NEBU: INHALATION_SOLUTION | RESPIRATORY_TRACT | Status: DC

## 2012-03-08 MED ORDER — PREDNISONE 20 MG PO TABS: ORAL_TABLET | ORAL | Status: DC

## 2012-03-08 MED ORDER — GUAIFENESIN ER 600 MG PO TB12: 1200.0000 mg | ORAL_TABLET | Freq: Two times a day (BID) | ORAL | Status: DC

## 2012-03-08 NOTE — Progress Notes (Addendum)
Pt on RA O2 was 89%.  While ambulating without O2, she dropped from 82 to 89%.  While ambulating on 2L, O2 stayed between 93 & 97%.

## 2012-03-08 NOTE — Discharge Summary (Addendum)
[facility]  Physician Discharge Summary   Patient ID: [name] MRN: [id number] DOB/AGE: 1950/10/07 62 y.o.  Admit date: 03/02/2012 Discharge date: 03/08/2012  PCP: Elby Showers, MD  DISCHARGE DIAGNOSES:  Principal Problem:  *COPD exacerbation Active Problems:  Hypertension  Influenza  Hypoxia  Hyponatremia  Hypokalemia   RECOMMENDATIONS FOR OUTPATIENT FOLLOW UP: 1. Needs close follow up. Going home on Oxygen which hopefully will be temporary 2. Will need PFT in 4-6 weeks.  DISCHARGE CONDITION: fair  Diet recommendation: Regular  Filed Weights   03/02/12 1643  Weight: 66.679 kg (147 lb)    INITIAL HISTORY: Pt is 62 yo female who was seen in an outpatient office ([facility]) with nausea, vomiting, fevers, chills, shortness of breath, cough productive of yellow sputum, generalized weakness. Influenza swab + and pt sent to [facility] ED for further management. Patient was subsequently admitted.   Consultations:  None  Procedures:  None  HOSPITAL COURSE:   Acute COPD exacerbation with acute hypoxic respiratory failure  Likely secondary to viral illness imposed on COPD. Slow to improve. Still wheezing but improved air entry. She was initiated on steroids and will continue to take with taper. She continues to remain hypoxic with RA sats of 89% at rest and 82-89% with ambulation. On O2 she sats around 93%. She will be sent home on oxygen which I anticipate will be temporary. Her oxygenation should improve as her bronchitis improves.  Influenza/H1N1  She has completed 5 days of Tamiflu and remains afebrile.   Hypertension  Reasonably well controlled. Continue home meds.  Hyponatremia  Resolved. Was secondary to dehydration   Diarrhea  She had transient diarrhea. C. Diff by PCR negative. This has resolved.   Patient has improved though continues to have some wheezing. She denies any chest pain or shortness of breath after exertion. Her oxygen  saturations did drop and so she will need home oxygen. She will be sent with a nebulizer machine as well. I have explained to her that she will need to have a PFT done in 4-6 weeks.   PERTINENT LABS:  The results of significant diagnostics from this hospitalization (including imaging, microbiology, ancillary and laboratory) are listed below for reference.    Microbiology: Recent Results (from the past 240 hour(s))  CLOSTRIDIUM DIFFICILE BY PCR     Status: Normal   Collection Time   03/05/12 10:43 AM      Component Value Range Status Comment   C difficile by pcr NEGATIVE  NEGATIVE Final      Labs: Basic Metabolic Panel:  Lab 03/06/12 6578 03/05/12 0525 03/04/12 0510 03/03/12 0600 03/02/12 1654  NA 136 134* 132* 132* 130*  K 3.5 4.1 4.6 3.0* 3.3*  CL 99 99 99 95* 89*  CO2 28 27 25 25 28   GLUCOSE 99 95 94 96 92  BUN 7 8 8 12 17   CREATININE 0.65 0.65 0.67 0.67 0.80  CALCIUM 8.6 8.5 8.4 8.1* 9.0  MG -- -- -- 1.8 --  PHOS -- -- -- 2.3 --   Liver Function Tests:  Lab 03/03/12 0600  AST 70*  ALT 29  ALKPHOS 73  BILITOT 0.6  PROT 5.6*  ALBUMIN 2.6*   CBC:  Lab 03/06/12 0510 03/05/12 0525 03/04/12 0510 03/03/12 0600 03/02/12 1654  WBC 5.3 4.9 5.0 6.3 9.2  NEUTROABS -- -- -- -- 7.5  HGB 12.5 13.0 12.7 12.8 14.5  HCT 38.7 41.0 39.7 39.6 43.2  MCV 85.1 86.0 85.2 84.3 84.4  PLT 161  146* 146* 137* 176    IMAGING STUDIES Dg Chest 2 View  03/02/2012  *RADIOLOGY REPORT*  Clinical Data: Fever, shortness of breath and cough.  CHEST - 2 VIEW  Comparison: Chest x-ray 07/09/2009.  Findings: Lung volumes are normal.  No consolidative airspace disease.  No pleural effusions.  No pneumothorax.  No pulmonary nodule or mass noted.  Pulmonary vasculature and the cardiomediastinal silhouette are within normal limits. Atherosclerotic calcifications are noted within the arch of the aorta.  IMPRESSION: 1. No radiographic evidence of acute cardiopulmonary disease. 2.  Atherosclerosis.   Original  Report Authenticated By: Trudie Reed, M.D.     DISCHARGE EXAMINATION: Filed Vitals:   03/07/12 2200 03/08/12 0135 03/08/12 0700 03/08/12 0820  BP: 131/58  153/76   Pulse: 79  93   Temp: 97.8 F (36.6 C)  98.3 F (36.8 C)   TempSrc: Oral  Oral   Resp: 20  20   Height:      Weight:      SpO2: 91% 91% 94% 93%   General appearance: alert, cooperative, appears stated age and no distress Resp: end expiratory wheezing bilaterally. no crackles. Cardio: regular rate and rhythm, S1, S2 normal, no murmur, click, rub or gallop GI: soft, non-tender; bowel sounds normal; no masses,  no organomegaly Neurologic: Alert and oriented X 3, normal strength and tone. Normal symmetric reflexes. Normal coordination and gait  DISPOSITION: Home  Discharge Orders    Future Appointments: Provider: Department: Dept Phone: Center:   03/15/2012 10:30 AM Rachael Fee [facility] [facility] [phone] None   03/15/2012 11:00 AM Josph Macho, MD [facility] [facility] [phone] None     Future Orders Please Complete By Expires   Diet - low sodium heart healthy      Increase activity slowly      Discharge instructions      Comments:   Be sure to follow up with your PCP early next week. As discussed you will need a pulmonary function test in 4-6 weeks to see if you have chronic lung disease. Discuss this with your doctor. Continue to avoid smoking and don't be around people who smoke. Seek attention if your symptoms get worse.     Current Discharge Medication List    START taking these medications   Details  albuterol (PROVENTIL HFA;VENTOLIN HFA) 108 (90 BASE) MCG/ACT inhaler Inhale 2 puffs into the lungs every 6 (six) hours as needed for wheezing. Qty: 1 Inhaler, Refills: 2    albuterol (PROVENTIL) (5 MG/ML) 0.5% nebulizer solution Take every 6 hours for 3 days and then as needed for wheezing Qty: 20 mL, Refills: 1    guaiFENesin (MUCINEX) 600 MG 12 hr  tablet Take 2 tablets (1,200 mg total) by mouth 2 (two) times daily. Qty: 30 tablet, Refills: 0    guaiFENesin-dextromethorphan (ROBITUSSIN DM) 100-10 MG/5ML syrup Take 5 mLs by mouth every 4 (four) hours as needed for cough. Qty: 118 mL, Refills: 0    predniSONE (DELTASONE) 20 MG tablet Take 3 tablets once daily for 3 days, then take 2 tablets once daily for 3 days and then take 1 tablet once daily for 3 days and then stop. Qty: 18 tablet, Refills: 0      CONTINUE these medications which have NOT CHANGED   Details  ALPRAZolam (XANAX) 0.5 MG tablet Take 0.5 mg by mouth at bedtime.     aspirin 325 MG EC tablet Take 325 mg by mouth  daily.     clotrimazole-betamethasone (LOTRISONE) cream Apply 1 application topically 2 (two) times daily.    escitalopram (LEXAPRO) 10 MG tablet Take 5 mg by mouth daily.     hydrochlorothiazide (,MICROZIDE/HYDRODIURIL,) 12.5 MG capsule Take 12.5 mg by mouth daily.     lansoprazole (PREVACID) 30 MG capsule Take 30 mg by mouth daily as needed. Ulcer/ acid reflux    metoprolol (TOPROL-XL) 100 MG 24 hr tablet Take 150 mg by mouth daily.     Multiple Vitamin (MULTIVITAMIN WITH MINERALS) TABS Take 1 tablet by mouth daily.    vitamin E 400 UNIT capsule Take 400 Units by mouth daily.       Follow-up Information    Follow up with [facility], MD. Schedule an appointment as soon as possible for a visit in 4 days.   Contact information:   [address] [place] [address]          TOTAL DISCHARGE TIME: 35 mins  [facility]  [facility] Pager [phone]  03/08/2012, 12:03 PM

## 2012-03-13 ENCOUNTER — Telehealth: Payer: Self-pay | Admitting: Hematology & Oncology

## 2012-03-13 NOTE — Telephone Encounter (Signed)
Pt moved 1-24 to 2-10 she has H1N1 and is seeing another MD on 1-24

## 2012-03-15 ENCOUNTER — Ambulatory Visit: Payer: 59 | Admitting: Hematology & Oncology

## 2012-03-15 ENCOUNTER — Other Ambulatory Visit: Payer: 59 | Admitting: Lab

## 2012-03-15 ENCOUNTER — Other Ambulatory Visit (HOSPITAL_COMMUNITY): Payer: Self-pay | Admitting: Internal Medicine

## 2012-03-15 DIAGNOSIS — J449 Chronic obstructive pulmonary disease, unspecified: Secondary | ICD-10-CM

## 2012-03-26 ENCOUNTER — Encounter (HOSPITAL_COMMUNITY): Payer: 59

## 2012-04-01 ENCOUNTER — Other Ambulatory Visit (HOSPITAL_BASED_OUTPATIENT_CLINIC_OR_DEPARTMENT_OTHER): Payer: 59 | Admitting: Lab

## 2012-04-01 ENCOUNTER — Ambulatory Visit (HOSPITAL_BASED_OUTPATIENT_CLINIC_OR_DEPARTMENT_OTHER): Payer: 59 | Admitting: Hematology & Oncology

## 2012-04-01 ENCOUNTER — Ambulatory Visit (HOSPITAL_BASED_OUTPATIENT_CLINIC_OR_DEPARTMENT_OTHER): Payer: 59

## 2012-04-01 VITALS — BP 113/74 | HR 68 | Temp 97.1°F | Resp 16 | Ht 61.0 in | Wt 147.0 lb

## 2012-04-01 DIAGNOSIS — Z23 Encounter for immunization: Secondary | ICD-10-CM

## 2012-04-01 DIAGNOSIS — D751 Secondary polycythemia: Secondary | ICD-10-CM

## 2012-04-01 DIAGNOSIS — B1710 Acute hepatitis C without hepatic coma: Secondary | ICD-10-CM

## 2012-04-01 DIAGNOSIS — D45 Polycythemia vera: Secondary | ICD-10-CM

## 2012-04-01 LAB — FERRITIN: Ferritin: 34 ng/mL (ref 10–291)

## 2012-04-01 LAB — AFP TUMOR MARKER: AFP-Tumor Marker: 23.5 ng/mL — ABNORMAL HIGH (ref 0.0–8.0)

## 2012-04-01 LAB — CBC WITH DIFFERENTIAL (CANCER CENTER ONLY)
BASO#: 0 10*3/uL (ref 0.0–0.2)
Eosinophils Absolute: 0.1 10*3/uL (ref 0.0–0.5)
HGB: 14.3 g/dL (ref 11.6–15.9)
LYMPH%: 34.1 % (ref 14.0–48.0)
MCH: 27.7 pg (ref 26.0–34.0)
MCV: 84 fL (ref 81–101)
MONO#: 0.5 10*3/uL (ref 0.1–0.9)
NEUT#: 2.8 10*3/uL (ref 1.5–6.5)
Platelets: 174 10*3/uL (ref 145–400)
RBC: 5.17 10*6/uL (ref 3.70–5.32)
WBC: 5.1 10*3/uL (ref 3.9–10.0)

## 2012-04-01 LAB — COMPREHENSIVE METABOLIC PANEL
ALT: 30 U/L (ref 0–35)
CO2: 28 mEq/L (ref 19–32)
Calcium: 8.8 mg/dL (ref 8.4–10.5)
Chloride: 102 mEq/L (ref 96–112)
Glucose, Bld: 113 mg/dL — ABNORMAL HIGH (ref 70–99)
Sodium: 138 mEq/L (ref 135–145)
Total Protein: 6.3 g/dL (ref 6.0–8.3)

## 2012-04-01 MED ORDER — INFLUENZA VIRUS VACC SPLIT PF IM SUSP
0.5000 mL | Freq: Once | INTRAMUSCULAR | Status: AC
  Administered 2012-04-01: 0.5 mL via INTRAMUSCULAR
  Filled 2012-04-01: qty 0.5

## 2012-04-01 NOTE — Progress Notes (Signed)
This office note has been dictated.

## 2012-04-02 ENCOUNTER — Encounter (HOSPITAL_COMMUNITY): Payer: 59

## 2012-04-02 NOTE — Progress Notes (Signed)
CC:   Georgann Housekeeper, MD  DIAGNOSES: 1. 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 2. Hepatitis C-not active.  CURRENT THERAPY: 1. Phlebotomy to maintain hematocrit less than 45%. 2. Aspirin 81 mg p.o. daily.  INTERIM HISTORY:  Ms. Aguilera comes in for her followup.  She is doing quite well.  She did have the H1N1 flu last month.  She was hospitalized for about 6 days.  She has had no problems otherwise.  Her last phlebotomy I think was back in October.  Otherwise, she has had no other issues.  We check her alpha fetoprotein every so often.  When we last checked it, her alpha fetoprotein was 23 which, for her, is holding steady.  We also watching her ferritin.  Her last ferritin was 30.  She is going to be seeing a hepatitis doctor at [facility].  PHYSICAL EXAMINATION:  General:  This is a well-developed, well- nourished white female in no obvious distress.  Vital signs: Temperature 97.1, pulse 68, respiratory rate 16, blood pressure 113/74. Weight is 147.  Head and neck:  Normocephalic, atraumatic skull.  There are no ocular or oral lesions.  There are no palpable cervical or supraclavicular lymph nodes.  Lungs:  Clear bilaterally.  Cardiac: Regular rate and rhythm with a normal S1 and S2.  There are no murmurs, rubs, or bruits.  Abdomen:  Soft with good bowel sounds.  There is no palpable abdominal mass.  There is no palpable hepatosplenomegaly. Extremities:  No clubbing, cyanosis, or edema.  Neurological:  No focal neurological deficits.  Skin:  No rashes, ecchymoses, or petechiae.  LABORATORY STUDIES:  White cell count is 5.1, hemoglobin 14.3, hematocrit 43.5, platelet count 174.  IMPRESSION:  Ms. Espinal is a very nice 62 year old white female with polycythemia.  Again, she is doing quite well.  She does not need to be phlebotomized today.  We will go ahead and plan to get her back in another 3 months.  I think this would be a reasonable time for  followup.    ______________________________ Josph Macho, M.D. PRE/MEDQ  D:  04/01/2012  T:  04/02/2012  Job:  [id number]

## 2012-04-15 ENCOUNTER — Encounter: Payer: Self-pay | Admitting: Cardiology

## 2012-06-27 ENCOUNTER — Ambulatory Visit (HOSPITAL_BASED_OUTPATIENT_CLINIC_OR_DEPARTMENT_OTHER): Payer: 59 | Admitting: Hematology & Oncology

## 2012-06-27 ENCOUNTER — Other Ambulatory Visit (HOSPITAL_BASED_OUTPATIENT_CLINIC_OR_DEPARTMENT_OTHER): Payer: 59 | Admitting: Lab

## 2012-06-27 VITALS — BP 115/76 | HR 66 | Temp 97.9°F | Resp 16 | Ht 61.0 in | Wt 145.0 lb

## 2012-06-27 DIAGNOSIS — B1710 Acute hepatitis C without hepatic coma: Secondary | ICD-10-CM

## 2012-06-27 DIAGNOSIS — D45 Polycythemia vera: Secondary | ICD-10-CM

## 2012-06-27 DIAGNOSIS — D751 Secondary polycythemia: Secondary | ICD-10-CM

## 2012-06-27 DIAGNOSIS — Z7982 Long term (current) use of aspirin: Secondary | ICD-10-CM

## 2012-06-27 LAB — CBC WITH DIFFERENTIAL (CANCER CENTER ONLY)
BASO%: 0.5 % (ref 0.0–2.0)
EOS%: 1.4 % (ref 0.0–7.0)
LYMPH#: 2.1 10*3/uL (ref 0.9–3.3)
MCHC: 32.7 g/dL (ref 32.0–36.0)
MONO#: 0.6 10*3/uL (ref 0.1–0.9)
NEUT#: 3.8 10*3/uL (ref 1.5–6.5)
NEUT%: 56.5 % (ref 39.6–80.0)
Platelets: 181 10*3/uL (ref 145–400)
RDW: 14.1 % (ref 11.1–15.7)
WBC: 6.6 10*3/uL (ref 3.9–10.0)

## 2012-06-27 LAB — COMPREHENSIVE METABOLIC PANEL
ALT: 34 U/L (ref 0–35)
AST: 51 U/L — ABNORMAL HIGH (ref 0–37)
Albumin: 3.8 g/dL (ref 3.5–5.2)
Alkaline Phosphatase: 87 U/L (ref 39–117)
BUN: 15 mg/dL (ref 6–23)
Potassium: 3.7 mEq/L (ref 3.5–5.3)
Sodium: 139 mEq/L (ref 135–145)

## 2012-06-27 LAB — AFP TUMOR MARKER: AFP-Tumor Marker: 19.7 ng/mL — ABNORMAL HIGH (ref 0.0–8.0)

## 2012-06-27 NOTE — Progress Notes (Signed)
This office note has been dictated.

## 2012-06-28 NOTE — Progress Notes (Signed)
CC:   Georgann Housekeeper, MD  DIAGNOSES: 1. Polycythemia vera, JAK2 negative. 2. Hepatitis C.  CURRENT THERAPY: 1. Phlebotomy to maintain hematocrit below 45%. 2. Aspirin 81 mg p.o. daily.  INTERIM HISTORY:  Ms. Fischler comes in for followup.  We last saw her back in February.  She is doing okay.  She recovered from the H1N1 flu.  She is still working without any difficulties.  There have been no problems with fever, sweats, or chills.  She has had no change in bowel or bladder habits.  She was last phlebotomized, I think, back in October 2013.  We are checking her alpha-fetoprotein, I think, twice a year.  Her last alpha-fetoprotein was 24 back in February.  Her last liver evaluation was, I think, back in 2009.  Everything looked okay, without any evidence of hepatic mass.  PHYSICAL EXAMINATION:  General:  This is a well-developed, well- nourished white female, in no obvious distress.  Vital Signs: Temperature of 97.9, pulse 56, with respiratory rate 16, blood pressure 115/76.  Weight is 145.  Head and neck:  Normocephalic, atraumatic skull.  There are no ocular or oral lesions.  There is no scleral icterus.  There is no adenopathy in the neck.  Lungs:  Clear bilaterally.  Cardiac:  Regular rate and rhythm, with a normal S1, S2. There are no murmurs, rubs, or bruits.  Abdomen:  Soft, with good bowel sounds.  There is no palpable abdominal mass.  There is no fluid wave. There is no palpable hepatosplenomegaly.  Extremities:  Show no clubbing, cyanosis or edema.  Skin:  No rashes, ecchymosis, or petechia. Neurological:  No focal neurological deficits.  LABORATORY STUDIES:  White cell count is 6.6, hemoglobin 15, hematocrit 45.5, platelet count 181,000.  MCV is 84.  IMPRESSION:  Ms. Montavon is a very nice 62 year old white female with polycythemia.  We will hold on phlebotomy for right now.  I really think that she is going to be okay without having to be phlebotomized  right now.  I do want to get her back in 2 months' time to make sure that we adequately monitor her hemoglobin.  We will check her alpha-fetoprotein when we see her back.    ______________________________ Josph Macho, M.D. PRE/MEDQ  D:  06/27/2012  T:  06/28/2012  Job:  [id number]

## 2012-07-25 ENCOUNTER — Other Ambulatory Visit: Payer: Self-pay

## 2012-07-25 DIAGNOSIS — Z1231 Encounter for screening mammogram for malignant neoplasm of breast: Secondary | ICD-10-CM

## 2012-08-29 ENCOUNTER — Ambulatory Visit (HOSPITAL_BASED_OUTPATIENT_CLINIC_OR_DEPARTMENT_OTHER): Payer: 59 | Admitting: Hematology & Oncology

## 2012-08-29 ENCOUNTER — Other Ambulatory Visit: Payer: 59 | Admitting: Lab

## 2012-08-29 ENCOUNTER — Ambulatory Visit: Payer: 59

## 2012-08-29 ENCOUNTER — Other Ambulatory Visit (HOSPITAL_BASED_OUTPATIENT_CLINIC_OR_DEPARTMENT_OTHER): Payer: 59 | Admitting: Lab

## 2012-08-29 ENCOUNTER — Telehealth: Payer: Self-pay | Admitting: Hematology & Oncology

## 2012-08-29 ENCOUNTER — Ambulatory Visit: Payer: 59 | Admitting: Hematology & Oncology

## 2012-08-29 VITALS — BP 116/60 | HR 67 | Temp 97.2°F | Resp 16 | Ht 61.0 in | Wt 143.0 lb

## 2012-08-29 DIAGNOSIS — D751 Secondary polycythemia: Secondary | ICD-10-CM

## 2012-08-29 DIAGNOSIS — B1710 Acute hepatitis C without hepatic coma: Secondary | ICD-10-CM

## 2012-08-29 LAB — CBC WITH DIFFERENTIAL (CANCER CENTER ONLY)
BASO%: 0.5 % (ref 0.0–2.0)
EOS%: 1.8 % (ref 0.0–7.0)
Eosinophils Absolute: 0.2 10*3/uL (ref 0.0–0.5)
HCT: 48.1 % — ABNORMAL HIGH (ref 34.8–46.6)
LYMPH%: 30.8 % (ref 14.0–48.0)
MCH: 28.2 pg (ref 26.0–34.0)
MCHC: 32.6 g/dL (ref 32.0–36.0)
MCV: 87 fL (ref 81–101)
MONO#: 0.7 10*3/uL (ref 0.1–0.9)
MONO%: 8.1 % (ref 0.0–13.0)
NEUT#: 4.9 10*3/uL (ref 1.5–6.5)
NEUT%: 58.8 % (ref 39.6–80.0)
RDW: 16 % — ABNORMAL HIGH (ref 11.1–15.7)
WBC: 8.3 10*3/uL (ref 3.9–10.0)

## 2012-08-29 LAB — CMP (CANCER CENTER ONLY)
CO2: 29 mEq/L (ref 18–33)
Creat: 0.9 mg/dl (ref 0.6–1.2)
Glucose, Bld: 117 mg/dL (ref 73–118)
Total Bilirubin: 0.7 mg/dl (ref 0.20–1.60)

## 2012-08-29 NOTE — Telephone Encounter (Signed)
Left message for [name] to schedule phlebotomy for pt next week in the morning and with Hellen. Pt aware either [name] or myself will call her for appointment

## 2012-08-29 NOTE — Progress Notes (Signed)
This office note has been dictated.

## 2012-08-30 ENCOUNTER — Telehealth: Payer: Self-pay | Admitting: Hematology & Oncology

## 2012-08-30 ENCOUNTER — Ambulatory Visit: Payer: 59 | Admitting: Hematology & Oncology

## 2012-08-30 ENCOUNTER — Other Ambulatory Visit: Payer: 59 | Admitting: Lab

## 2012-08-30 NOTE — Progress Notes (Signed)
CC:   Amber Housekeeper, MD  DIAGNOSIS: 1. Polycythemia vera, JAK2 negative. 2. Hepatitis C.  CURRENT THERAPY: 1. Phlebotomy to maintain hematocrit below 45%. 2. Aspirin 81 mg p.o. daily.  INTERIM HISTORY:  [name] comes in for followup.  We last saw her back in May.  She has not been phlebotomized probably for a good 10 months.  She now is seeing a hepatologist.  She is going out to [facility] for the hepatitis C. From my point of view, she has not had any problems with the hepatitis C.  She has had no issues with nausea or vomiting.  There has been no cough or shortness of breath.  She has had no change in bowel or bladder habits.  Of note, her alpha-fetoprotein has been on the elevated side but has been stable.  Back in May, her alpha-fetoprotein was 20.  PHYSICAL EXAMINATION:  General:  This is a well-developed, well- nourished, white female in no obvious distress.  Vital Signs:  Show a temperature of 97.2, pulse 67, respiratory rate 16, blood pressure 116/60.  Weight is 143.  Head and Neck:  Shows a normocephalic, atraumatic skull.  There are no ocular or oral lesions.  There are no palpable cervical or supraclavicular lymph nodes.  Lungs:  Clear bilaterally.  Cardiac:  Regular rate and rhythm with a normal S1 and S2. There are no murmurs, rubs or bruits.  Abdomen:  Soft with good bowel sounds.  There is no palpable abdominal mass.  There is no fluid wave. There is no palpable hepatosplenomegaly.  Extremities:  Show no clubbing, cyanosis or edema.  Neurological:  Shows no focal neurological deficits.  LABORATORY STUDIES:  White cell count is 8.3, hemoglobin 15.7, hematocrit 48.1, and platelet count 217.  IMPRESSION:  [name] is a very nice, 62 year old, white female with polycythemia.  She will need to be phlebotomized.  We will try to get her set up for next week.  She likes to go to [place] to have this done.  I am glad that she is finally getting the  hepatitis C looked at.  She may be qualifying for a study.  Hopefully, she will be on a study.  I will plan to have her back to see me in another 2 months or so.    ______________________________ Josph Macho, M.D. PRE/MEDQ  D:  08/29/2012  T:  08/30/2012  Job:  [id number]

## 2012-08-30 NOTE — Telephone Encounter (Signed)
Pt aware of 7-16 phlebotomy

## 2012-09-04 ENCOUNTER — Telehealth: Payer: Self-pay | Admitting: *Deleted

## 2012-09-04 NOTE — Telephone Encounter (Signed)
Per patient she over slept. Appt moved to tomorrow.  JMW

## 2012-09-05 ENCOUNTER — Ambulatory Visit (HOSPITAL_BASED_OUTPATIENT_CLINIC_OR_DEPARTMENT_OTHER): Payer: 59

## 2012-09-05 VITALS — BP 135/63 | HR 66 | Temp 98.0°F | Resp 20

## 2012-09-05 DIAGNOSIS — D751 Secondary polycythemia: Secondary | ICD-10-CM

## 2012-09-05 DIAGNOSIS — D45 Polycythemia vera: Secondary | ICD-10-CM

## 2012-09-05 MED ORDER — SODIUM CHLORIDE 0.9 % IV SOLN
Freq: Once | INTRAVENOUS | Status: AC
  Administered 2012-09-05: 10:00:00 via INTRAVENOUS

## 2012-09-05 NOTE — Progress Notes (Signed)
Pt. arrived for therapeutic phlebotomy today. Hct 48.1 done at [facility] on 08/29/12. Pt. states that she is feeling more fatigue and more headaches.   Pt. did not have any breakfast. " I can't eat this early".  Rt. Antecubital site used with secondary set used and 20g angiocath used.  At 1023: pt. became "lightheaded",  and c/o'ed of numbness and tingling all over.  Pt. placed back in recliner . Phlebotomy stopped at 370g.  IV line flushed and primary saline line open to infuse. At 1025hr, pt. became "nauseous"  and subside around 1032hr while laying on her back.  No vomiting. BP 53/33 Pulse 54. At 1027hr, call to [facility] Dr. Gustavo Lah office, Amy Bickling RN notified of pt's situation.  Per Dr. Myna Hidalgo in clinic, okay to stop therapeutic phlebotomy and infuse 1 liter of normal saline now. Pt. has history of 2 stents placed.  IVF infusion at 770ml/hr. Pt. Aware. At 1030hr, BP 52/25 remains with IVF.  AT 1034 BP 96/53 P 64.  C/o'ed of headache.   At 1049 BP 115 64 p 62, denied further headache, lightheadness or N/T.   At 1105 BP 115 62 P 63

## 2012-09-05 NOTE — Patient Instructions (Addendum)
[facility] Discharge Instructions for Patients Receiving Therapeutic Phlebotomy  Please always have something to eat prior to arrival for appointment to prevent any symptoms during this procedure    BELOW ARE SYMPTOMS THAT SHOULD BE REPORTED IMMEDIATELY:   NAUSEA AND VOMITING THAT IS NOT CONTROLLED WITH YOUR NAUSEA MEDICATION  *UNUSUAL SHORTNESS OF BREATH  *UNUSUAL BRUISING OR BLEEDING  Feel free to call the Dr. Gustavo Lah clinic you have any questions or concerns. The clinic phone number is [phone]. Call 911 if you would require immediate attention.     Therapeutic Phlebotomy Therapeutic phlebotomy is the controlled removal of blood from your body for the purpose of treating a medical condition. It is similar to donating blood. Usually, about a pint (470 mL) of blood is removed. The average adult has 9 to 12 pints (4.3 to 5.7 L) of blood. Therapeutic phlebotomy may be used to treat the following medical conditions:  Hemochromatosis. This is a condition in which there is too much iron in the blood.  Polycythemia vera. This is a condition in which there are too many red cells in the blood.  Porphyria cutanea tarda. This is a disease usually passed from one generation to the next (inherited). It is a condition in which an important part of hemoglobin is not made properly. This results in the build up of abnormal amounts of porphyrins in the body.  Sickle cell disease. This is an inherited disease. It is a condition in which the red blood cells form an abnormal crescent shape rather than a round shape. LET YOUR CAREGIVER KNOW ABOUT:  Allergies.  Medicines taken including herbs, eyedrops, over-the-counter medicines, and creams.  Use of steroids (by mouth or creams).  Previous problems with anesthetics or numbing medicine.  History of blood clots.  History of bleeding or blood problems.  Previous surgery.  Possibility of pregnancy, if this applies. RISKS  AND COMPLICATIONS This is a simple and safe procedure. Problems are unlikely. However, problems can occur and may include:  Nausea or lightheadedness.  Low blood pressure.  Soreness, bleeding, swelling, or bruising at the needle insertion site.  Infection. BEFORE THE PROCEDURE  This is a procedure that can be done as an outpatient. Confirm the time that you need to arrive for your procedure. Confirm whether there is a need to fast or withhold any medications. It is helpful to wear clothing with sleeves that can be raised above the elbow. A blood sample may be done to determine the amount of red blood cells or iron in your blood. Plan ahead of time to have someone drive you home after the procedure. PROCEDURE The entire procedure from preparation through recovery takes about 1 hour. The actual collection takes about 10 to 15 minutes.  A needle will be inserted into your vein.  Tubing and a collection bag will be attached to that needle.  Blood will flow through the needle and tubing into the collection bag.  You may be asked to open and close your hand slowly and continuously during the entire collection.  Once the specified amount of blood has been removed from your body, the collection bag and tubing will be clamped.  The needle will be removed.  Pressure will be held on the site of the needle insertion to stop the bleeding. Then a bandage will be placed over the needle insertion site. AFTER THE PROCEDURE  Your recovery will be assessed and monitored. If there are no problems, as an outpatient, you should be able  to go home shortly after the procedure.  Document Released: 07/11/2010 Document Revised: 05/01/2011 Document Reviewed: 07/11/2010 [facility] Patient Information 2014 [place], [place].

## 2012-09-06 ENCOUNTER — Telehealth: Payer: Self-pay | Admitting: *Deleted

## 2012-09-06 NOTE — Telephone Encounter (Signed)
Reviewed events from 09/05/12 phlebotomy with Dr Myna Hidalgo. Chrystie Nose, RN inquired if Ms. Holian needed to come back in for another phlebotomy as she was only able to get 370g. Dr Myna Hidalgo thinks it will be fine for her to wait until her Sept appt to determine if she she needs another one. Spoke to Ms.Deakins about this and she said that she doesn't feel as good as she normally does after receiving a phlebotomy but will wait a week or so to see how things go before [place] beach [place]. She understands to call the office should she feel another phleb is needed before Sept.

## 2012-09-25 ENCOUNTER — Ambulatory Visit
Admission: RE | Admit: 2012-09-25 | Discharge: 2012-09-25 | Disposition: A | Discharge status: Home / Self Care | Payer: 59 | Source: Ambulatory Visit

## 2012-09-25 DIAGNOSIS — Z1231 Encounter for screening mammogram for malignant neoplasm of breast: Secondary | ICD-10-CM

## 2012-09-26 ENCOUNTER — Other Ambulatory Visit: Payer: Self-pay | Admitting: Obstetrics and Gynecology

## 2012-09-26 DIAGNOSIS — R928 Other abnormal and inconclusive findings on diagnostic imaging of breast: Secondary | ICD-10-CM

## 2012-10-16 ENCOUNTER — Other Ambulatory Visit: Payer: 59

## 2012-10-31 ENCOUNTER — Other Ambulatory Visit (HOSPITAL_BASED_OUTPATIENT_CLINIC_OR_DEPARTMENT_OTHER): Payer: 59 | Admitting: Lab

## 2012-10-31 ENCOUNTER — Ambulatory Visit (HOSPITAL_BASED_OUTPATIENT_CLINIC_OR_DEPARTMENT_OTHER): Payer: 59 | Admitting: Hematology & Oncology

## 2012-10-31 VITALS — BP 125/70 | HR 69 | Temp 98.1°F | Resp 20 | Ht 61.0 in | Wt 144.0 lb

## 2012-10-31 DIAGNOSIS — F17200 Nicotine dependence, unspecified, uncomplicated: Secondary | ICD-10-CM

## 2012-10-31 DIAGNOSIS — B1710 Acute hepatitis C without hepatic coma: Secondary | ICD-10-CM

## 2012-10-31 DIAGNOSIS — D751 Secondary polycythemia: Secondary | ICD-10-CM

## 2012-10-31 DIAGNOSIS — D45 Polycythemia vera: Secondary | ICD-10-CM

## 2012-10-31 LAB — CBC WITH DIFFERENTIAL (CANCER CENTER ONLY)
Eosinophils Absolute: 0.1 10*3/uL (ref 0.0–0.5)
HCT: 44.7 % (ref 34.8–46.6)
LYMPH%: 33.6 % (ref 14.0–48.0)
MCV: 88 fL (ref 81–101)
MONO#: 0.8 10*3/uL (ref 0.1–0.9)
NEUT%: 53.5 % (ref 39.6–80.0)
RBC: 5.07 10*6/uL (ref 3.70–5.32)
WBC: 7.1 10*3/uL (ref 3.9–10.0)

## 2012-10-31 NOTE — Progress Notes (Signed)
This office note has been dictated.

## 2012-11-01 LAB — COMPREHENSIVE METABOLIC PANEL
Albumin: 3.7 g/dL (ref 3.5–5.2)
BUN: 14 mg/dL (ref 6–23)
Calcium: 9.2 mg/dL (ref 8.4–10.5)
Chloride: 105 mEq/L (ref 96–112)
Creatinine, Ser: 0.83 mg/dL (ref 0.50–1.10)
Glucose, Bld: 106 mg/dL — ABNORMAL HIGH (ref 70–99)
Potassium: 4.3 mEq/L (ref 3.5–5.3)

## 2012-11-01 NOTE — Progress Notes (Signed)
CC:   Georgann Housekeeper, MD  DIAGNOSES: 1. Polycythemia vera, JAK2 negative. 2. Hepatitis C.-untreated.  CURRENT THERAPY: 1. Phlebotomy to maintain hematocrit below 45%. 2. Aspirin 81 mg p.o. daily.  INTERIM HISTORY:  Ms. Pavlich comes in for a followup.  We last saw her back in July.  When we last saw her, she had a phlebotomy.  At that point, her hematocrit was 48.1.  She always has issues with the phlebotomies.  She gets vasovagal with them.  She is working without difficulties.  She is having no headache.  She is having no nausea or vomiting.  There has been no pain in the hands or feet.  There has been no change in bowel or bladder habits.  She is being evaluated for the hepatitis C.  She goes over to [facility] for this.  I think she is due for a CT scan.  She is not yet on any therapy for the hepatitis C.  She really has been asymptomatic with the hepatitis C.  When we last saw her, her ferritin was down to 9.  When we last saw her, her alpha-fetoprotein was 18.5.  She has had no rashes.  There has been no leg swelling.  There has been no headache.  She is still smoking.  PHYSICAL EXAMINATION:  General:  This is a well developed, well nourished white female in no obvious distress.  Vital signs: Temperature of 98.1, pulse 69, respiratory rate 20, blood pressure 125/70, weight is 144.  Head and neck:  Normocephalic, atraumatic skull. There are no ocular or oral lesions.  There are no palpable cervical or supraclavicular lymph nodes.  Lungs:  Clear to percussion and auscultation bilaterally.  Cardiac:  Regular rate and rhythm with a normal S1 and S2.  There are no murmurs, rubs or bruits.  Abdomen: Soft.  She has good bowel sounds.  There is no fluid wave.  There is no palpable hepatosplenomegaly.  Back:  No tenderness over the spine, ribs or hips.  Extremities:  Show no clubbing, cyanosis or edema.  She has good range motion of her joints.  She has good muscle  strength bilaterally.  Neurologic:  No focal neurological deficits.  Skin:  No rashes, ecchymosis or petechia.  LABORATORY STUDIES:  White cell count is 7.1, hemoglobin 14.3, hematocrit 44.7, platelet count 202. Ferritin is 14.  IMPRESSION:  [name] is a very charming 62 year old white female with polycythemia vera.  She has does not need to be phlebotomized this time.  She is happy about this.  I told her that as long as she is smoking, she always will have a tendency to increase her blood count.  She has always had an elevated alpha fetoprotein.  We have looked into this.  We have done scans.  We have to be careful with the hepatitis C. She is at risk for hepatocellular carcinoma.  Again, she is being seen at [facility] for this.  I will plan to get her back in another couple of months.  I think this is a reasonable amount of time for followup.  I was seen over at the toward 40 comes. Lucianne Muss is setting.    ______________________________ Josph Macho, M.D. PRE/MEDQ  D:  10/31/2012  T:  11/01/2012  Job:  [id number]

## 2012-11-04 ENCOUNTER — Telehealth: Payer: Self-pay

## 2012-11-04 NOTE — Telephone Encounter (Addendum)
Message copied by Leana Gamer on Mon Nov 04, 2012  3:47 PM ------      Message from: Josph Macho      Created: Sun Nov 03, 2012  8:13 PM       Call - labs look ok!!  Cindee Lame ------left message on patient voice mail regarding her lab being ok.

## 2012-11-07 ENCOUNTER — Ambulatory Visit
Admission: RE | Admit: 2012-11-07 | Discharge: 2012-11-07 | Disposition: A | Discharge status: Home / Self Care | Payer: 59 | Source: Ambulatory Visit | Attending: Obstetrics and Gynecology | Admitting: Obstetrics and Gynecology

## 2012-11-07 DIAGNOSIS — R928 Other abnormal and inconclusive findings on diagnostic imaging of breast: Secondary | ICD-10-CM

## 2012-12-12 ENCOUNTER — Other Ambulatory Visit: Payer: Self-pay | Admitting: Internal Medicine

## 2012-12-24 ENCOUNTER — Encounter: Payer: Self-pay | Admitting: Family Medicine

## 2012-12-24 ENCOUNTER — Ambulatory Visit (INDEPENDENT_AMBULATORY_CARE_PROVIDER_SITE_OTHER): Payer: 59 | Admitting: Family Medicine

## 2012-12-24 VITALS — BP 144/72 | HR 83 | Temp 97.8°F | Resp 18 | Ht 61.5 in | Wt 141.0 lb

## 2012-12-24 DIAGNOSIS — I10 Essential (primary) hypertension: Secondary | ICD-10-CM

## 2012-12-24 DIAGNOSIS — D751 Secondary polycythemia: Secondary | ICD-10-CM

## 2012-12-24 DIAGNOSIS — N61 Mastitis without abscess: Secondary | ICD-10-CM

## 2012-12-24 DIAGNOSIS — D45 Polycythemia vera: Secondary | ICD-10-CM

## 2012-12-24 DIAGNOSIS — N611 Abscess of the breast and nipple: Secondary | ICD-10-CM

## 2012-12-24 MED ORDER — HYDROCODONE-ACETAMINOPHEN 5-325 MG PO TABS: 1.0000 | ORAL_TABLET | Freq: Four times a day (QID) | ORAL | Status: DC | PRN

## 2012-12-24 MED ORDER — SULFAMETHOXAZOLE-TRIMETHOPRIM 800-160 MG PO TABS: 2.0000 | ORAL_TABLET | Freq: Two times a day (BID) | ORAL | Status: DC

## 2012-12-24 NOTE — Progress Notes (Signed)
Procedure Note: Verbal consent obtained.  Local anesthesia with 2 cc 2% lidocaine.  Betadine prep.  Incision with 11 blade.  Copious purulence expressed.  Cx collected.  Wound irrigated with remaining anesthetic.  Packed with 1/4 inch plain packing.  Cleansed and dressed.  Discussed wound care.  Pt tolerated well.  Wound care in 48 hours.

## 2012-12-24 NOTE — Patient Instructions (Signed)
 Abscess Care After An abscess (also called a boil or furuncle) is an infected area that contains a collection of pus. Signs and symptoms of an abscess include pain, tenderness, redness, or hardness, or you may feel a moveable soft area under your skin. An abscess can occur anywhere in the body. The infection may spread to surrounding tissues causing cellulitis. A cut (incision) by the surgeon was made over your abscess and the pus was drained out. Gauze may have been packed into the space to provide a drain that will allow the cavity to heal from the inside outwards. The boil may be painful for 5 to 7 days. Most people with a boil do not have high fevers. Your abscess, if seen early, may not have localized, and may not have been lanced. If not, another appointment may be required for this if it does not get better on its own or with medications. HOME CARE INSTRUCTIONS   Only take over-the-counter or prescription medicines for pain, discomfort, or fever as directed by your caregiver.  When you bathe, soak and then remove gauze or iodoform packs at least daily or as directed by your caregiver. You may then wash the wound gently with mild soapy water. Repack with gauze or do as your caregiver directs. SEEK IMMEDIATE MEDICAL CARE IF:   You develop increased pain, swelling, redness, drainage, or bleeding in the wound site.  You develop signs of generalized infection including muscle aches, chills, fever, or a general ill feeling.  An oral temperature above 102 F (38.9 C) develops, not controlled by medication. See your caregiver for a recheck if you develop any of the symptoms described above. If medications (antibiotics) were prescribed, take them as directed. Document Released: 08/25/2004 Document Revised: 05/01/2011 Document Reviewed: 04/22/2007 ExitCare Patient Information 2014 [employer].   Abscess An abscess is an infected area that contains a collection of pus and debris.It can  occur in almost any part of the body. An abscess is also known as a furuncle or boil. CAUSES  An abscess occurs when tissue gets infected. This can occur from blockage of oil or sweat glands, infection of hair follicles, or a minor injury to the skin. As the body tries to fight the infection, pus collects in the area and creates pressure under the skin. This pressure causes pain. People with weakened immune systems have difficulty fighting infections and get certain abscesses more often.  SYMPTOMS Usually an abscess develops on the skin and becomes a painful mass that is red, warm, and tender. If the abscess forms under the skin, you may feel a moveable soft area under the skin. Some abscesses break open (rupture) on their own, but most will continue to get worse without care. The infection can spread deeper into the body and eventually into the bloodstream, causing you to feel ill.  DIAGNOSIS  Your caregiver will take your medical history and perform a physical exam. A sample of fluid may also be taken from the abscess to determine what is causing your infection. TREATMENT  Your caregiver may prescribe antibiotic medicines to fight the infection. However, taking antibiotics alone usually does not cure an abscess. Your caregiver may need to make a small cut (incision) in the abscess to drain the pus. In some cases, gauze is packed into the abscess to reduce pain and to continue draining the area. HOME CARE INSTRUCTIONS   Only take over-the-counter or prescription medicines for pain, discomfort, or fever as directed by your caregiver.  If   you were prescribed antibiotics, take them as directed. Finish them even if you start to feel better.  If gauze is used, follow your caregiver's directions for changing the gauze.  To avoid spreading the infection:  Keep your draining abscess covered with a bandage.  Wash your hands well.  Do not share personal care items, towels, or whirlpools with  others.  Avoid skin contact with others.  Keep your skin and clothes clean around the abscess.  Keep all follow-up appointments as directed by your caregiver. SEEK MEDICAL CARE IF:   You have increased pain, swelling, redness, fluid drainage, or bleeding.  You have muscle aches, chills, or a general ill feeling.  You have a fever. MAKE SURE YOU:   Understand these instructions.  Will watch your condition.  Will get help right away if you are not doing well or get worse. Document Released: 11/16/2004 Document Revised: 08/08/2011 Document Reviewed: 04/21/2011 ExitCare Patient Information 2014 [employer].  

## 2012-12-24 NOTE — Progress Notes (Signed)
Subjective:    Patient ID: [name], female    DOB: 24-Mar-1950, 62 y.o.   MRN: [id number] This chart was scribed for Norberto Sorenson, MD by Danella Maiers, ED Scribe. This patient was seen in room 10 and the patient's care was started at 8:25 PM.  Chief Complaint  Patient presents with  . knot on right breast    just had mammogram and ultrasound, soreness, redness, pus    HPI HPI Comments: [name] is a 62 y.o. female with a PMHx of HTN, polycythemia, and hepatitis C who presents to the [facility] complaining of a knot to the right breast. It was initially noted 3 months ago so pt had US/mammogram that showed a focal thickening of the skin on the right inferior portion of the areola approximately 2 x 0.5 cm in the 7 o'clock position. [place] did not show any abnormality other than this benign focal skin thickening and so was recommended to follow with watchful waiting and recheck in one year.  However, pt admits that since then she has conintued to frequently manipulate the area - kept squeezing it to see if she could get anything out and so several times it did enlarge and then drain pus. However, the area again started to develop swelling, pain and redness 1 wk prev but this time she has not been able to get it to come to a head and drain so has just been progressively worsening.  Past Medical History  Diagnosis Date  . Hepatitis C   . Hypertension   . Polycythemia 2009  . CAD (coronary artery disease)   . Arthritis   . GERD (gastroesophageal reflux disease)     Current Outpatient Prescriptions on File Prior to Visit  Medication Sig Dispense Refill  . albuterol (PROVENTIL HFA;VENTOLIN HFA) 108 (90 BASE) MCG/ACT inhaler Inhale 2 puffs into the lungs every 6 (six) hours as needed for wheezing.  1 Inhaler  2  . ALPRAZolam (XANAX) 0.5 MG tablet Take 0.5 mg by mouth at bedtime.       Marland Kitchen aspirin 325 MG EC tablet Take 325 mg by mouth daily.       .  clotrimazole-betamethasone (LOTRISONE) cream Apply 1 application topically 2 (two) times daily.      . clotrimazole-betamethasone (LOTRISONE) cream APPLY TOPICALLY 2 (TWO) TIMES DAILY.  15 g  0  . escitalopram (LEXAPRO) 10 MG tablet Take 5 mg by mouth daily.       . hydrochlorothiazide (,MICROZIDE/HYDRODIURIL,) 12.5 MG capsule Take 12.5 mg by mouth daily.       . lansoprazole (PREVACID) 30 MG capsule Take 30 mg by mouth daily as needed. Ulcer/ acid reflux      . metoprolol (TOPROL-XL) 100 MG 24 hr tablet Take 150 mg by mouth daily.       . Multiple Vitamin (MULTIVITAMIN WITH MINERALS) TABS Take 1 tablet by mouth daily.      . valACYclovir (VALTREX) 1000 MG tablet Take 500 mg by mouth as needed.       . vitamin E 400 UNIT capsule Take 400 Units by mouth daily.       No current facility-administered medications on file prior to visit.   Allergies  Allergen Reactions  . Diphenhydramine Hcl Hives     Review of Systems  Constitutional: Negative for fever and chills.  HENT: Negative for rhinorrhea and sore throat.   Respiratory: Negative for cough and shortness of breath.   Gastrointestinal: Negative for  nausea, vomiting, abdominal pain and diarrhea.  Genitourinary: Negative for dysuria.  Skin: Positive for wound.  Psychiatric/Behavioral: Negative for confusion.  All other systems reviewed and are negative.      BP 144/72  Pulse 83  Temp(Src) 97.8 F (36.6 C) (Oral)  Resp 18  Ht 5' 1.5" (1.562 m)  Wt 141 lb (63.957 kg)  BMI 26.21 kg/m2  SpO2 95%  Objective:   Physical Exam  Nursing note and vitals reviewed. Constitutional: She is oriented to person, place, and time. She appears well-developed and well-nourished. No distress.  HENT:  Head: Normocephalic and atraumatic.  Eyes: EOM are normal.  Neck: Neck supple. No tracheal deviation present.  Cardiovascular: Normal rate.   Pulmonary/Chest: Effort normal. No respiratory distress.  Genitourinary: There is breast tenderness. No  breast discharge.  9x11 cm area of blanching erythema, warmth, and tenderness extending from 4 oclock to 9 oclock from the areola not affecting the nipple at all but is overlining the lateral inferior portion of the areola and breast. Central 6x4 cm area of fluctuance.  Musculoskeletal: Normal range of motion.  Neurological: She is alert and oriented to person, place, and time.  Skin: Skin is warm and dry.  Psychiatric: She has a normal mood and affect. Her behavior is normal.       Assessment & Plan:  Polycythemia  HYPERTENSION  Breast abscess of female - Plan: Wound culture - [facility] recommends bactrim to treat mastitis with abscess w/ concern for MRSA.  Last LFTs were nml so will try bactrim but if pt has adverse effects to this can switch to doxy at f/u - esp after would clx returns.  S/p I&D today by Debbra Riding. Recheck in 48 hrs.  Meds ordered this encounter  Medications  . vitamin C (ASCORBIC ACID) 500 MG tablet    Sig: Take 500 mg by mouth daily.  Marland Kitchen sulfamethoxazole-trimethoprim (BACTRIM DS,SEPTRA DS) 800-160 MG per tablet    Sig: Take 2 tablets by mouth 2 (two) times daily.    Dispense:  40 tablet    Refill:  0  . HYDROcodone-acetaminophen (NORCO/VICODIN) 5-325 MG per tablet    Sig: Take 1 tablet by mouth every 6 (six) hours as needed for moderate pain.    Dispense:  10 tablet    Refill:  0    I personally performed the services described in this documentation, which was scribed in Amber presence. The recorded information has been reviewed and considered, and addended by me as needed.  Norberto Sorenson, MD MPH

## 2012-12-26 ENCOUNTER — Other Ambulatory Visit: Payer: Self-pay

## 2012-12-26 ENCOUNTER — Ambulatory Visit (INDEPENDENT_AMBULATORY_CARE_PROVIDER_SITE_OTHER): Payer: 59 | Admitting: Physician Assistant

## 2012-12-26 VITALS — BP 126/80 | HR 70 | Temp 98.1°F | Resp 16

## 2012-12-26 DIAGNOSIS — N61 Mastitis without abscess: Secondary | ICD-10-CM

## 2012-12-26 DIAGNOSIS — N611 Abscess of the breast and nipple: Secondary | ICD-10-CM

## 2012-12-26 NOTE — Progress Notes (Signed)
  Subjective:    Patient ID: [name], female    DOB: 02/21/1950, 62 y.o.   MRN: [id number]  HPI   [name] is a 62 yr old female here for follow up on a breast abscess that was drained here 48 hours ago.  Pt reports she is doing well.  Less pain.  Taking the abx and tolerating them well.  She has not changed the dressing at all.  She has done a few warm compresses.  No fever, chills.     Review of Systems  Constitutional: Negative for fever and chills.  Gastrointestinal: Negative.   Musculoskeletal: Negative.   Skin: Positive for wound.  Neurological: Negative.        Objective:   Physical Exam  Vitals reviewed. Constitutional: She is oriented to person, place, and time. She appears well-developed and well-nourished. No distress.  HENT:  Head: Normocephalic and atraumatic.  Eyes: Conjunctivae are normal. No scleral icterus.  Pulmonary/Chest: Effort normal.  Neurological: She is alert and oriented to person, place, and time.  Skin: Skin is warm and dry.  Wound at right breast; much less indurated today but still erythematous  Psychiatric: She has a normal mood and affect. Her behavior is normal.    Wound Care: Dressing and packing removed.  Packing saturated with blood and purulent material.  Spontaneous drainage of thick purulent material with removal of packing.  I was able to further express a significant amount of purulence.  The wound opening is small, and I'm not sure that it will accommodate more packing, so I have not repacked today.  I think the packing may have actually prevented drainage of the area.        Assessment & Plan:  Breast abscess of female   [name] is a 62 yr old female here for follow up on a breast abscess that was drained 48 hours ago.  The area is improving, though I was still able to express a significant amount of thick purulence.  I have not repacked the area today - I think it may actually drain better without packing.  I encouraged  the patient to do frequent hot compresses and then gently try to express purulence.  Daily and prn dressing changes.  Continue bactrim - still awaiting cx results.  Follow up 48 hours.  Fast track card updated.  Loleta Dicker MHS, PA-C [facility] 11/6/20142:58 PM

## 2012-12-28 LAB — WOUND CULTURE
Gram Stain: NONE SEEN
Gram Stain: NONE SEEN

## 2012-12-29 ENCOUNTER — Encounter (HOSPITAL_COMMUNITY): Payer: Self-pay | Admitting: Emergency Medicine

## 2012-12-29 ENCOUNTER — Emergency Department (INDEPENDENT_AMBULATORY_CARE_PROVIDER_SITE_OTHER)
Admission: EM | Admit: 2012-12-29 | Discharge: 2012-12-29 | Disposition: A | Discharge status: Home / Self Care | Payer: 59 | Source: Home / Self Care | Attending: Family Medicine | Admitting: Family Medicine

## 2012-12-29 DIAGNOSIS — R5381 Other malaise: Secondary | ICD-10-CM

## 2012-12-29 DIAGNOSIS — R5383 Other fatigue: Secondary | ICD-10-CM

## 2012-12-29 DIAGNOSIS — D45 Polycythemia vera: Secondary | ICD-10-CM

## 2012-12-29 LAB — POCT I-STAT, CHEM 8
Chloride: 102 mEq/L (ref 96–112)
Creatinine, Ser: 1.3 mg/dL — ABNORMAL HIGH (ref 0.50–1.10)
Glucose, Bld: 130 mg/dL — ABNORMAL HIGH (ref 70–99)
Hemoglobin: 18.4 g/dL — ABNORMAL HIGH (ref 12.0–15.0)
Potassium: 4.1 mEq/L (ref 3.5–5.1)
Sodium: 136 mEq/L (ref 135–145)

## 2012-12-29 NOTE — ED Provider Notes (Signed)
CSN: [id number]     Arrival date & time 12/29/12  1825 History   First MD Initiated Contact with Patient 12/29/12 1840     Chief Complaint  Patient presents with  . Fatigue   (Consider location/radiation/quality/duration/timing/severity/associated sxs/prior Treatment) HPI Comments: 89f presents c/o fatigue, intermittent chest pains, and generally feeling bad for the past few days.  This has been gradually worsening.  She has never felt this way before.  She notes Hx of polycythemia vera and Hx of hep C.  She last required phlebotomy 1 month ago.  She smokes 1/2 pack per day.     Past Medical History  Diagnosis Date  . Hepatitis C   . Hypertension   . Polycythemia 2009  . CAD (coronary artery disease)   . Arthritis   . GERD (gastroesophageal reflux disease)    Past Surgical History  Procedure Laterality Date  . 2 stints  Aug 26,2010    Dr. Rudean Haskell  . Cartoid endartherectomy  2007  . Coronary angioplasty with stent placement  10/09/2008   Family History  Problem Relation Age of Onset  . Heart disease Mother   . Heart disease Father    History  Substance Use Topics  . Smoking status: Current Every Day Smoker -- 0.50 packs/day for 45 years  . Smokeless tobacco: Never Used  . Alcohol Use: No     Comment: occassionally   OB History   Grav Para Term Preterm Abortions TAB SAB Ect Mult Living                 Review of Systems  Constitutional: Positive for activity change and fatigue. Negative for fever and chills.  Eyes: Negative for visual disturbance.  Respiratory: Negative for cough and shortness of breath.   Cardiovascular: Positive for chest pain. Negative for palpitations and leg swelling.  Gastrointestinal: Negative for nausea, vomiting and abdominal pain.  Endocrine: Negative for polydipsia and polyuria.  Genitourinary: Negative for dysuria, urgency and frequency.  Musculoskeletal: Negative for arthralgias and myalgias.  Skin: Negative for rash.  Neurological:  Positive for headaches. Negative for dizziness, weakness and light-headedness.    Allergies  Diphenhydramine hcl  Home Medications   Current Outpatient Rx  Name  Route  Sig  Dispense  Refill  . albuterol (PROVENTIL HFA;VENTOLIN HFA) 108 (90 BASE) MCG/ACT inhaler   Inhalation   Inhale 2 puffs into the lungs every 6 (six) hours as needed for wheezing.   1 Inhaler   2   . ALPRAZolam (XANAX) 0.5 MG tablet   Oral   Take 0.5 mg by mouth at bedtime.          Marland Kitchen aspirin 325 MG EC tablet   Oral   Take 325 mg by mouth daily.          . clotrimazole-betamethasone (LOTRISONE) cream   Topical   Apply 1 application topically 2 (two) times daily.         . clotrimazole-betamethasone (LOTRISONE) cream      APPLY TOPICALLY 2 (TWO) TIMES DAILY.   15 g   0   . escitalopram (LEXAPRO) 10 MG tablet   Oral   Take 5 mg by mouth daily.          . hydrochlorothiazide (,MICROZIDE/HYDRODIURIL,) 12.5 MG capsule   Oral   Take 12.5 mg by mouth daily.          Marland Kitchen HYDROcodone-acetaminophen (NORCO/VICODIN) 5-325 MG per tablet   Oral   Take 1 tablet by mouth every 6 (  six) hours as needed for moderate pain.   10 tablet   0   . lansoprazole (PREVACID) 30 MG capsule   Oral   Take 30 mg by mouth daily as needed. Ulcer/ acid reflux         . metoprolol (TOPROL-XL) 100 MG 24 hr tablet   Oral   Take 150 mg by mouth daily.          . Multiple Vitamin (MULTIVITAMIN WITH MINERALS) TABS   Oral   Take 1 tablet by mouth daily.         Marland Kitchen sulfamethoxazole-trimethoprim (BACTRIM DS,SEPTRA DS) 800-160 MG per tablet   Oral   Take 2 tablets by mouth 2 (two) times daily.   40 tablet   0   . valACYclovir (VALTREX) 1000 MG tablet   Oral   Take 500 mg by mouth as needed.          . vitamin C (ASCORBIC ACID) 500 MG tablet   Oral   Take 500 mg by mouth daily.         . vitamin E 400 UNIT capsule   Oral   Take 400 Units by mouth daily.          BP 158/86  Pulse 58  Temp(Src)  98.4 F (36.9 C) (Oral)  Resp 18  SpO2 97% Physical Exam  Nursing note and vitals reviewed. Constitutional: She is oriented to person, place, and time. Vital signs are normal. She appears well-developed and well-nourished. No distress.  HENT:  Head: Normocephalic and atraumatic.  Neck: No tracheal deviation present.  Cardiovascular: Normal rate, regular rhythm and normal heart sounds.  Exam reveals no gallop and no friction rub.   No murmur heard. Pulmonary/Chest: Effort normal and breath sounds normal. No respiratory distress. She has no wheezes. She has no rales.  Abdominal: Soft. There is no tenderness.  Neurological: She is alert and oriented to person, place, and time. She has normal strength. Coordination normal.  Skin: Skin is warm and dry. No rash noted. She is not diaphoretic.  Psychiatric: She has a normal mood and affect. Judgment normal.    ED Course  Procedures (including critical care time) Labs Review Labs Reviewed  POCT I-STAT, CHEM 8 - Abnormal; Notable for the following:    Creatinine, Ser 1.30 (*)    Glucose, Bld 130 (*)    Hemoglobin 18.4 (*)    HCT 54.0 (*)    All other components within normal limits   Imaging Review No results found.    MDM   1. Fatigue   2. Polycythemia vera    PE is normal.  I-stat reveals increased H/H and increased creatinine.  This pt needs phlebotomy.  She will call her doctor in the AM to set this up.  ER if any worsening.      Graylon Good, PA-C 12/29/12 2000

## 2012-12-29 NOTE — ED Notes (Signed)
C/O NOT FEELING WELL STATES SHE IS FATIGUE, TIRED, LEGS HURT, HAS CHEST PAINS, AND HEADACHE PATIENT HAS BEEN TAKING NITROGLYCERIN FOR CHEST PAINS STATES SHE JUST DON'T FEEL RIGHT.

## 2012-12-29 NOTE — ED Provider Notes (Signed)
Medical screening examination/treatment/procedure(s) were performed by resident physician or non-physician practitioner and as supervising physician I was immediately available for consultation/collaboration.   Barkley Bruns MD.   Linna Hoff, MD 12/29/12 2003

## 2012-12-31 ENCOUNTER — Other Ambulatory Visit (HOSPITAL_BASED_OUTPATIENT_CLINIC_OR_DEPARTMENT_OTHER): Payer: 59 | Admitting: Lab

## 2012-12-31 ENCOUNTER — Ambulatory Visit (HOSPITAL_BASED_OUTPATIENT_CLINIC_OR_DEPARTMENT_OTHER): Payer: 59 | Admitting: Hematology & Oncology

## 2012-12-31 ENCOUNTER — Telehealth: Payer: Self-pay | Admitting: *Deleted

## 2012-12-31 VITALS — BP 139/60 | HR 59 | Temp 98.2°F | Resp 14 | Ht 61.0 in | Wt 143.0 lb

## 2012-12-31 DIAGNOSIS — D45 Polycythemia vera: Secondary | ICD-10-CM

## 2012-12-31 DIAGNOSIS — B1710 Acute hepatitis C without hepatic coma: Secondary | ICD-10-CM

## 2012-12-31 DIAGNOSIS — D751 Secondary polycythemia: Secondary | ICD-10-CM

## 2012-12-31 DIAGNOSIS — D179 Benign lipomatous neoplasm, unspecified: Secondary | ICD-10-CM

## 2012-12-31 DIAGNOSIS — F17200 Nicotine dependence, unspecified, uncomplicated: Secondary | ICD-10-CM

## 2012-12-31 LAB — CBC WITH DIFFERENTIAL (CANCER CENTER ONLY)
BASO#: 0 10*3/uL (ref 0.0–0.2)
Eosinophils Absolute: 0.1 10*3/uL (ref 0.0–0.5)
HCT: 46.2 % (ref 34.8–46.6)
HGB: 14.9 g/dL (ref 11.6–15.9)
LYMPH#: 2.4 10*3/uL (ref 0.9–3.3)
MCH: 27.9 pg (ref 26.0–34.0)
MONO#: 0.6 10*3/uL (ref 0.1–0.9)
NEUT#: 4 10*3/uL (ref 1.5–6.5)
Platelets: 243 10*3/uL (ref 145–400)
RBC: 5.34 10*6/uL — ABNORMAL HIGH (ref 3.70–5.32)

## 2012-12-31 MED ORDER — AMOXICILLIN-POT CLAVULANATE 875-125 MG PO TABS: 1.0000 | ORAL_TABLET | Freq: Two times a day (BID) | ORAL | Status: DC

## 2012-12-31 NOTE — Telephone Encounter (Signed)
Returned pt's call regarding the concerns she had about the "big knot" she has on her hand/arm where a blood draw was attempted earlier today. She wanted to know why that happened. Explained that sometimes the needle can go through the vein causing blood to leak out into the tissue causing a bruise and/or knot. Attempted to give her interventions but she stated "I have to get back to work and hung up". Was able to tell her to call back if it worsens.   Of note - the interventions that the pt would have been given are:     1. Leave the pressure dressing on for at least 8 hours.   2. Do not take additional aspirin or ibuprofen for 72 hours (other than normal dose of ASA)  3. Avoid lifting heavy objects with the arm   4. Apply ice packs, wrapped in a cloth, to the affected site for approximately 20 minutes one or more times during the first 24 hours following the formation of the bruise or hematoma.   5. You may apply warm, moist compresses to the site for 20 minutes one or more time during the second 24 hours after the collection.   6. If you notice any of the following complications notify the office immediately   . Discoloration of the hand  . Additional swelling  . Generalized pain or discomfort of the arm  . Throbbing of the arm  . Numbness in the arm

## 2012-12-31 NOTE — Progress Notes (Signed)
This office note has been dictated.

## 2013-01-03 NOTE — Progress Notes (Signed)
DIAGNOSES: 1. 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 2. Hepatitis C.  CURRENT THERAPY: 1. Phlebotomy to maintain hematocrit below 45%. 2. Aspirin 81 mg p.o. daily.  INTERIM HISTORY:  [name] comes in for followup.  She had a little bit of a "scare" a couple of weeks ago.  She had this breast issue with the right breast.  She ultimately had a drainage of a cyst.  She was put on some antibiotic with Bactrim.  She may have had a reaction to the Bactrim.  She went to a local urgent care.  She was found have a hemoglobin of 18.4 and hematocrit of 54.  The doctors there were very worried, they have sent [place] the results.  I had her come over to repeat the CBC.  She thinks that a lot of her problem was the Bactrim.  She has never had it before.  As such, she may have an allergic reaction to it.  She is feeling better right now.  She is still having some drainage with the right breast cyst.  I will put her on some Augmentin for 5 days.  She still has not yet seen a doctor for her hepatitis C.  Her last ferritin back in September was 14.  She has had no headache.  There has been no cough.  She is smoking a half pack per day and trying to cut back.  PHYSICAL EXAMINATION:  General:  This is a well-developed, well- nourished white female, in no obvious distress.  Vital Signs: Temperature of 98.2, pulse 59, respiratory rate 14, blood pressure 139/60, weight is 143 pounds.  Head and Neck:  Normocephalic, atraumatic skull.  There are no ocular or oral lesions.  There are no palpable cervical or supraclavicular lymph nodes.  Lungs:  Clear bilaterally. Cardiac:  Regular rate and rhythm with a normal S1 and S2.  There are no murmurs, rubs, or bruits.  Abdomen:  Soft.  She has good bowel sounds. There is no palpable abdominal mass.  There is no palpable hepatosplenomegaly.  Extremities:  No clubbing, cyanosis, or edema. Neurological:  No focal neurological deficits.  Skin:  No plethora or ruddy  complexion.  LABORATORY STUDIES:  White cell count is 7.1, hemoglobin 14.9, hematocrit 46.2, platelet count 243.  MCV is 87.  IMPRESSION:  [name] is a nice 62 year old white female with polycythemia.  She is at about 45 today.  However, as she is doing well, I think we are okay with not having to phlebotomize her right now.  I want to get her back in about a month or so.  At that point in time, we will see what her hemoglobin and hematocrit are.  She will continue on the aspirin.    ______________________________ Josph Macho, M.D. PRE/MEDQ  D:  12/30/2012  T:  01/01/2013  Job:  [id number]

## 2013-01-07 ENCOUNTER — Telehealth: Payer: Self-pay | Admitting: Hematology & Oncology

## 2013-01-07 NOTE — Telephone Encounter (Signed)
Faxed Medical Records via fax today  to:  [facility] Ph: [phone] Fx: [phone]  Medical  Records requested from 2011 to 2014  CONSENT COPY SCANNED

## 2013-01-30 ENCOUNTER — Ambulatory Visit: Payer: 59 | Admitting: Hematology & Oncology

## 2013-01-30 ENCOUNTER — Telehealth: Payer: Self-pay | Admitting: Hematology & Oncology

## 2013-01-30 ENCOUNTER — Other Ambulatory Visit: Payer: 59 | Admitting: Lab

## 2013-01-30 NOTE — Telephone Encounter (Signed)
Left pt message moved 1-6 to 1-13

## 2013-01-30 NOTE — Telephone Encounter (Signed)
Patient called and cx 01/30/13 apt and resch for 02/26/12.  Nurse was notified of cx apt

## 2013-02-25 ENCOUNTER — Ambulatory Visit: Payer: 59 | Admitting: Hematology & Oncology

## 2013-02-25 ENCOUNTER — Other Ambulatory Visit: Payer: 59 | Admitting: Lab

## 2013-02-26 ENCOUNTER — Other Ambulatory Visit: Payer: Self-pay | Admitting: Nurse Practitioner

## 2013-02-26 DIAGNOSIS — D751 Secondary polycythemia: Secondary | ICD-10-CM

## 2013-02-26 DIAGNOSIS — K921 Melena: Secondary | ICD-10-CM

## 2013-02-27 ENCOUNTER — Ambulatory Visit: Payer: 59 | Admitting: Hematology & Oncology

## 2013-02-27 ENCOUNTER — Other Ambulatory Visit: Payer: 59 | Admitting: Lab

## 2013-02-27 ENCOUNTER — Telehealth: Payer: Self-pay | Admitting: Hematology & Oncology

## 2013-02-27 NOTE — Telephone Encounter (Signed)
Patient called and cx 02/27/13 apt and resch for 03/20/13.  RN was notified

## 2013-03-18 ENCOUNTER — Telehealth: Payer: Self-pay | Admitting: Hematology & Oncology

## 2013-03-18 NOTE — Telephone Encounter (Signed)
Patient called and cx1/29/15 apt and resch for 03/19/13

## 2013-03-19 ENCOUNTER — Telehealth: Payer: Self-pay | Admitting: Hematology & Oncology

## 2013-03-19 ENCOUNTER — Other Ambulatory Visit: Payer: 59 | Admitting: Lab

## 2013-03-19 ENCOUNTER — Ambulatory Visit: Payer: 59 | Admitting: Hematology & Oncology

## 2013-03-19 NOTE — Telephone Encounter (Signed)
Patient missed 03/19/13 apt and resch back to 03/20/13 apt

## 2013-03-20 ENCOUNTER — Other Ambulatory Visit (HOSPITAL_BASED_OUTPATIENT_CLINIC_OR_DEPARTMENT_OTHER): Payer: 59 | Admitting: Lab

## 2013-03-20 ENCOUNTER — Encounter: Payer: Self-pay | Admitting: Hematology & Oncology

## 2013-03-20 ENCOUNTER — Ambulatory Visit (HOSPITAL_BASED_OUTPATIENT_CLINIC_OR_DEPARTMENT_OTHER): Payer: 59 | Admitting: Hematology & Oncology

## 2013-03-20 ENCOUNTER — Ambulatory Visit: Payer: 59 | Admitting: Hematology & Oncology

## 2013-03-20 ENCOUNTER — Other Ambulatory Visit: Payer: 59 | Admitting: Lab

## 2013-03-20 VITALS — BP 119/58 | HR 65 | Temp 98.2°F | Resp 14 | Ht 61.0 in | Wt 141.0 lb

## 2013-03-20 DIAGNOSIS — D751 Secondary polycythemia: Secondary | ICD-10-CM

## 2013-03-20 DIAGNOSIS — K921 Melena: Secondary | ICD-10-CM

## 2013-03-20 DIAGNOSIS — B1710 Acute hepatitis C without hepatic coma: Secondary | ICD-10-CM

## 2013-03-20 LAB — COMPREHENSIVE METABOLIC PANEL
ALT: 33 U/L (ref 0–35)
AST: 44 U/L — AB (ref 0–37)
Albumin: 3.7 g/dL (ref 3.5–5.2)
Alkaline Phosphatase: 100 U/L (ref 39–117)
BUN: 12 mg/dL (ref 6–23)
CO2: 26 mEq/L (ref 19–32)
Calcium: 9.2 mg/dL (ref 8.4–10.5)
Chloride: 97 mEq/L (ref 96–112)
Creatinine, Ser: 0.65 mg/dL (ref 0.50–1.10)
Glucose, Bld: 121 mg/dL — ABNORMAL HIGH (ref 70–99)
POTASSIUM: 3.7 meq/L (ref 3.5–5.3)
Sodium: 132 mEq/L — ABNORMAL LOW (ref 135–145)
TOTAL PROTEIN: 6.5 g/dL (ref 6.0–8.3)
Total Bilirubin: 0.7 mg/dL (ref 0.2–1.2)

## 2013-03-20 LAB — CBC WITH DIFFERENTIAL (CANCER CENTER ONLY)
BASO#: 0 10*3/uL (ref 0.0–0.2)
BASO%: 0.3 % (ref 0.0–2.0)
EOS ABS: 0.1 10*3/uL (ref 0.0–0.5)
EOS%: 1.2 % (ref 0.0–7.0)
HCT: 48 % — ABNORMAL HIGH (ref 34.8–46.6)
HGB: 15.6 g/dL (ref 11.6–15.9)
LYMPH#: 1.9 10*3/uL (ref 0.9–3.3)
LYMPH%: 27.7 % (ref 14.0–48.0)
MCH: 28.6 pg (ref 26.0–34.0)
MCHC: 32.5 g/dL (ref 32.0–36.0)
MCV: 88 fL (ref 81–101)
MONO#: 0.6 10*3/uL (ref 0.1–0.9)
MONO%: 9.2 % (ref 0.0–13.0)
NEUT%: 61.6 % (ref 39.6–80.0)
NEUTROS ABS: 4.2 10*3/uL (ref 1.5–6.5)
PLATELETS: 213 10*3/uL (ref 145–400)
RBC: 5.45 10*6/uL — AB (ref 3.70–5.32)
RDW: 13.8 % (ref 11.1–15.7)
WBC: 6.7 10*3/uL (ref 3.9–10.0)

## 2013-03-20 LAB — AFP TUMOR MARKER: AFP-Tumor Marker: 19 ng/mL — ABNORMAL HIGH (ref 0.0–8.0)

## 2013-03-20 NOTE — Progress Notes (Signed)
This office note has been dictated.

## 2013-03-20 NOTE — Patient Instructions (Signed)
You Can Quit Smoking If you are ready to quit smoking or are thinking about it, congratulations! You have chosen to help yourself be healthier and live longer! There are lots of different ways to quit smoking. Nicotine gum, nicotine patches, a nicotine inhaler, or nicotine nasal spray can help with physical craving. Hypnosis, support groups, and medicines help break the habit of smoking. TIPS TO GET OFF AND STAY OFF CIGARETTES  Learn to predict your moods. Do not let a bad situation be your excuse to have a cigarette. Some situations in your life might tempt you to have a cigarette.  Ask friends and co-workers not to smoke around you.  Make your home smoke-free.  Never have "just one" cigarette. It leads to wanting another and another. Remind yourself of your decision to quit.  On a card, make a list of your reasons for not smoking. Read it at least the same number of times a day as you have a cigarette. Tell yourself everyday, "I do not want to smoke. I choose not to smoke."  Ask someone at home or work to help you with your plan to quit smoking.  Have something planned after you eat or have a cup of coffee. Take a walk or get other exercise to perk you up. This will help to keep you from overeating.  Try a relaxation exercise to calm you down and decrease your stress. Remember, you may be tense and nervous the first two weeks after you quit. This will pass.  Find new activities to keep your hands busy. Play with a pen, coin, or rubber band. Doodle or draw things on paper.  Brush your teeth right after eating. This will help cut down the craving for the taste of tobacco after meals. You can try mouthwash too.  Try gum, breath mints, or diet candy to keep something in your mouth. IF YOU SMOKE AND WANT TO QUIT:  Do not stock up on cigarettes. Never buy a carton. Wait until one pack is finished before you buy another.  Never carry cigarettes with you at work or at home.  Keep cigarettes  as far away from you as possible. Leave them with someone else.  Never carry matches or a lighter with you.  Ask yourself, "Do I need this cigarette or is this just a reflex?"  Bet with someone that you can quit. Put cigarette money in a piggy bank every morning. If you smoke, you give up the money. If you do not smoke, by the end of the week, you keep the money.  Keep trying. It takes 21 days to change a habit!  Talk to your doctor about using medicines to help you quit. These include nicotine replacement gum, lozenges, or skin patches. Document Released: 12/03/2008 Document Revised: 05/01/2011 Document Reviewed: 12/03/2008 ExitCare Patient Information 2014 [employer].  

## 2013-03-21 ENCOUNTER — Ambulatory Visit (HOSPITAL_BASED_OUTPATIENT_CLINIC_OR_DEPARTMENT_OTHER): Payer: 59

## 2013-03-21 ENCOUNTER — Other Ambulatory Visit: Payer: Self-pay | Admitting: Nurse Practitioner

## 2013-03-21 VITALS — BP 119/60 | HR 60 | Temp 97.0°F | Resp 16

## 2013-03-21 DIAGNOSIS — D751 Secondary polycythemia: Secondary | ICD-10-CM

## 2013-03-21 DIAGNOSIS — D45 Polycythemia vera: Secondary | ICD-10-CM

## 2013-03-21 LAB — FERRITIN CHCC: FERRITIN: 21 ng/mL (ref 9–269)

## 2013-03-21 MED ORDER — SODIUM CHLORIDE 0.9 % IV SOLN: INTRAVENOUS | Status: DC

## 2013-03-21 NOTE — Progress Notes (Signed)
DIAGNOSES: 1. 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 2. Hepatitis C.  CURRENT THERAPY: 1. Phlebotomy to maintain hematocrit below 45%. 2. Aspirin 81 mg p.o. daily.  INTERIM HISTORY:  [name] comes in for followup.  She is doing fairly well.  She has been seen at [facility].  This is for hepatitis C.  I think she sees Dr. Lolita Lenz.  She had an ultrasound of the abdomen back in November.  Everything looked fine with respect to the liver.  There was no obvious evidence of cirrhosis.  There is no lymphadenopathy.  She had normal vasculature.  She sees Dr. Lolita Lenz I think again in February.  She is still working.  She is not too keen about working.  She will retire this year.  She has had no problems with cough or shortness of breath.  She is still smoking, but trying to cut back.  She has had no fevers, sweats, or chills.  She did have a little bit of bowel infection I think back in December.  We are watching her alpha fetoprotein.  This is because of hepatitis C. Back in September, the alpha fetoprotein was 17.  PHYSICAL EXAMINATION:  General:  This is a well-developed, well- nourished white female in no obvious distress.  Vital Signs: Temperature of 98.2, pulse 55, respiratory rate 14, blood pressure 119/58, and weight is 141 pounds.  Head and Neck:  Normocephalic and atraumatic skull.  There are no ocular or oral lesions.  There are no palpable cervical or supraclavicular lymph nodes.  Lungs:  Clear bilaterally.  Cardiac:  Regular rate and rhythm with a normal S1 and S2. There are no murmurs, rubs, or bruits.  Abdomen:  Soft.  She has good bowel sounds.  There is no fluid wave.  There is no palpable hepatosplenomegaly.  Back:  No tenderness over the spine, ribs, or hips. Extremities:  No clubbing, cyanosis, or edema.  Neurological:  No focal neurological deficits.  Skin:  No rashes, ecchymosis, or petechia.  LABORATORY STUDIES:  White cell count of 6.7, hemoglobin 15.2, hematocrit  48, platelet count 213.  IMPRESSION:  [name] is a very nice 63 year old white female with hepatitis C.  She has polycythemia vera.  She will need to be phlebotomized.  We will try to get this set up for this week or next.  We will have her go ahead and plan to get her back to see [place] in another 2 months.    ______________________________ [name], M.D. PRE/MEDQ  D:  03/20/2013  T:  03/21/2013  Job:  [id number]

## 2013-03-21 NOTE — Progress Notes (Signed)
Pt. arrived for therapeutic phlebotomy. 20 g x 1" angio catheter used via rt. Antecubital site.  Secondary set and empty bag method used.  Blood draining very slowly and line clotted at 172g.  Pt. tolerated procedure well.  Snacks and drink given during break.  Pt. understood that we would try again.  First attempt took 67minutes.  2nd attempt to lt antecubital site clotted 3rd attempt to rt. Wrist with 20gx1" angio cath with secondary set and empty bag method.  330g drained adding total of 1 unit phlebotomized today. Pt. Tolerated procedure well and did not require IVF today. HL.

## 2013-03-21 NOTE — Patient Instructions (Signed)

## 2013-03-21 NOTE — Progress Notes (Signed)
Patient discharged home without any complications. VS taken post phlebotomy. Patient has no complaints of feeling dizzy or lightheaded. AVS given. Cindi Carbon, RN

## 2013-04-07 ENCOUNTER — Encounter (HOSPITAL_COMMUNITY): Payer: 59

## 2013-04-14 ENCOUNTER — Encounter (HOSPITAL_COMMUNITY): Payer: 59

## 2013-04-21 ENCOUNTER — Encounter (HOSPITAL_COMMUNITY): Payer: 59

## 2013-04-28 ENCOUNTER — Encounter (HOSPITAL_COMMUNITY): Payer: 59

## 2013-05-02 ENCOUNTER — Ambulatory Visit (HOSPITAL_COMMUNITY): Payer: 59 | Attending: Interventional Cardiology | Admitting: Cardiology

## 2013-05-02 DIAGNOSIS — I6529 Occlusion and stenosis of unspecified carotid artery: Secondary | ICD-10-CM | POA: Insufficient documentation

## 2013-05-02 NOTE — Progress Notes (Signed)
Carotid completed 

## 2013-05-15 ENCOUNTER — Telehealth: Payer: Self-pay

## 2013-05-15 DIAGNOSIS — I6529 Occlusion and stenosis of unspecified carotid artery: Secondary | ICD-10-CM

## 2013-05-15 NOTE — Telephone Encounter (Signed)
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 Needs f/u in 68months ------

## 2013-05-15 NOTE — Telephone Encounter (Signed)
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 Needs f/u in 12months

## 2013-05-16 ENCOUNTER — Telehealth: Payer: Self-pay

## 2013-05-16 ENCOUNTER — Encounter: Payer: Self-pay | Admitting: Interventional Cardiology

## 2013-05-16 NOTE — Telephone Encounter (Signed)
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 Needs f/u in 44months.pt insist that Dr.Smith start her back on plavix.Marland Kitchenpt sts that she has a hx of carotid surgery,2 stents,polychythemia.pt sts that both of her parents have been plavix long term.and she feels she should be on it as well.adv her that Dr.Smith would need to to make that determination, adv her that Dr.Smith was not in the office this afternoon. I will fwd him a message and call back with his recommendation.pt verbalized understanding.

## 2013-05-18 NOTE — Telephone Encounter (Signed)
Decrease aspirin to 81 mg daily and start clopidogrel 75 mg daily. Let her know there will be increased risk of bleeding on dual therapy

## 2013-05-20 MED ORDER — ASPIRIN EC 81 MG PO TBEC: 81.0000 mg | DELAYED_RELEASE_TABLET | Freq: Every day | ORAL | Status: DC

## 2013-05-20 MED ORDER — CLOPIDOGREL BISULFATE 75 MG PO TABS: 75.0000 mg | ORAL_TABLET | Freq: Every day | ORAL | Status: DC

## 2013-05-20 NOTE — Telephone Encounter (Signed)
pt adv to Decrease aspirin to 81 mg daily and start clopidogrel 75 mg daily Rx sent to pt pharmacy. pt aware there is an increased risk of bleeding on dual therapy.pt verbalized understanding.

## 2013-05-20 NOTE — Telephone Encounter (Signed)
pt adv to Decrease aspirin to 81 mg daily and start clopidogrel 75 mg daily Rx sent to pt pharmacy. pt aware there is an increased risk of bleeding on dual therapy.

## 2013-05-26 ENCOUNTER — Ambulatory Visit (HOSPITAL_BASED_OUTPATIENT_CLINIC_OR_DEPARTMENT_OTHER): Payer: 59 | Admitting: Hematology & Oncology

## 2013-05-26 ENCOUNTER — Encounter: Payer: Self-pay | Admitting: Hematology & Oncology

## 2013-05-26 ENCOUNTER — Other Ambulatory Visit (HOSPITAL_BASED_OUTPATIENT_CLINIC_OR_DEPARTMENT_OTHER): Payer: 59 | Admitting: Lab

## 2013-05-26 VITALS — BP 131/63 | HR 57 | Temp 98.1°F | Resp 14 | Ht 61.0 in | Wt 141.0 lb

## 2013-05-26 DIAGNOSIS — R5383 Other fatigue: Secondary | ICD-10-CM

## 2013-05-26 DIAGNOSIS — D45 Polycythemia vera: Secondary | ICD-10-CM

## 2013-05-26 DIAGNOSIS — R5381 Other malaise: Secondary | ICD-10-CM

## 2013-05-26 DIAGNOSIS — B1710 Acute hepatitis C without hepatic coma: Secondary | ICD-10-CM

## 2013-05-26 DIAGNOSIS — D751 Secondary polycythemia: Secondary | ICD-10-CM

## 2013-05-26 DIAGNOSIS — B1920 Unspecified viral hepatitis C without hepatic coma: Secondary | ICD-10-CM

## 2013-05-26 DIAGNOSIS — F17200 Nicotine dependence, unspecified, uncomplicated: Secondary | ICD-10-CM

## 2013-05-26 LAB — CMP (CANCER CENTER ONLY)
ALBUMIN: 3.5 g/dL (ref 3.3–5.5)
ALK PHOS: 97 U/L — AB (ref 26–84)
ALT: 30 U/L (ref 10–47)
AST: 43 U/L — AB (ref 11–38)
BUN: 12 mg/dL (ref 7–22)
CO2: 32 mEq/L (ref 18–33)
Calcium: 9 mg/dL (ref 8.0–10.3)
Chloride: 100 mEq/L (ref 98–108)
Creat: 0.8 mg/dl (ref 0.6–1.2)
Glucose, Bld: 117 mg/dL (ref 73–118)
Potassium: 4.1 mEq/L (ref 3.3–4.7)
Sodium: 144 mEq/L (ref 128–145)
Total Bilirubin: 0.6 mg/dl (ref 0.20–1.60)
Total Protein: 6.9 g/dL (ref 6.4–8.1)

## 2013-05-26 LAB — CBC WITH DIFFERENTIAL (CANCER CENTER ONLY)
BASO#: 0 10*3/uL (ref 0.0–0.2)
BASO%: 0.6 % (ref 0.0–2.0)
EOS%: 1.2 % (ref 0.0–7.0)
Eosinophils Absolute: 0.1 10*3/uL (ref 0.0–0.5)
HEMATOCRIT: 43.8 % (ref 34.8–46.6)
HGB: 14 g/dL (ref 11.6–15.9)
LYMPH#: 2 10*3/uL (ref 0.9–3.3)
LYMPH%: 30.1 % (ref 14.0–48.0)
MCH: 26.5 pg (ref 26.0–34.0)
MCHC: 32 g/dL (ref 32.0–36.0)
MCV: 83 fL (ref 81–101)
MONO#: 0.6 10*3/uL (ref 0.1–0.9)
MONO%: 9.1 % (ref 0.0–13.0)
NEUT#: 3.9 10*3/uL (ref 1.5–6.5)
NEUT%: 59 % (ref 39.6–80.0)
Platelets: 225 10*3/uL (ref 145–400)
RBC: 5.29 10*6/uL (ref 3.70–5.32)
RDW: 13.1 % (ref 11.1–15.7)
WBC: 6.6 10*3/uL (ref 3.9–10.0)

## 2013-05-27 LAB — IRON AND TIBC CHCC
%SAT: 7 % — ABNORMAL LOW (ref 21–57)
Iron: 35 ug/dL — ABNORMAL LOW (ref 41–142)
TIBC: 523 ug/dL — AB (ref 236–444)
UIBC: 488 ug/dL — AB (ref 120–384)

## 2013-05-27 LAB — FERRITIN CHCC: Ferritin: 10 ng/ml (ref 9–269)

## 2013-05-27 NOTE — Progress Notes (Signed)
Hematology and Oncology Follow Up Visit  KIERRAH [name] [id number] 03/04/1950 63 y.o. 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 2. Hepatitis C.    Current Therapy:   Phlebotomy to maintain hematocrit below 45%.  2. Aspirin 81 mg p.o. daily.     Interim History:  Ms.  [name] is back for followup. She is doing pretty well. She is taking a medication for the hepatitis C. She's been seen by hepatologist I think out at [facility].  Her polycythemia is not been a problem. She do well with this. I will be without to phlebotomize her.  She is still working. She's had a little bit of fatigue. She's not having any nausea vomiting. There's no abdominal pain. There is no change in bowel bladder habits. She's had l no leg swelling . She's had no rashes.  Medications: Current outpatient prescriptions:albuterol (PROVENTIL HFA;VENTOLIN HFA) 108 (90 BASE) MCG/ACT inhaler, Inhale 2 puffs into the lungs every 6 (six) hours as needed for wheezing., Disp: 1 Inhaler, Rfl: 2;  ALPRAZolam (XANAX) 0.5 MG tablet, Take 0.5 mg by mouth at bedtime. , Disp: , Rfl: ;  aspirin EC 81 MG tablet, Take 1 tablet (81 mg total) by mouth daily., Disp: , Rfl: 3 clopidogrel (PLAVIX) 75 MG tablet, Take 1 tablet (75 mg total) by mouth daily., Disp: 30 tablet, Rfl: 5;  clotrimazole-betamethasone (LOTRISONE) cream, as needed, Disp: , Rfl: ;  Cyanocobalamin (VITAMIN B 12 PO), Take by mouth every morning., Disp: , Rfl: ;  escitalopram (LEXAPRO) 10 MG tablet, Take 5 mg by mouth daily. , Disp: , Rfl: ;  hydrochlorothiazide (,MICROZIDE/HYDRODIURIL,) 12.5 MG capsule, Take 12.5 mg by mouth daily. , Disp: , Rfl:  lansoprazole (PREVACID) 30 MG capsule, Take 30 mg by mouth daily as needed. Ulcer/ acid reflux, Disp: , Rfl: ;  metoprolol (TOPROL-XL) 100 MG 24 hr tablet, Take 150 mg by mouth daily. , Disp: , Rfl: ;  Multiple Vitamin (MULTIVITAMIN WITH MINERALS) TABS, Take 1 tablet by mouth daily., Disp: , Rfl: ;   vitamin C (ASCORBIC ACID) 500 MG tablet, Take 500 mg by mouth daily., Disp: , Rfl:  vitamin E 400 UNIT capsule, Take 400 Units by mouth daily., Disp: , Rfl: ;  Ombitas-Paritapre-Ritona-Dasab (VIEKIRA PAK) 12.5-75-50 &250 MG TBPK, Take with a meal about same times each day: 2 pink tabs AND 1 beige tab every morning, 1 beige tab every evening, Disp: , Rfl:   Allergies:  Allergies  Allergen Reactions  . Bactrim [Sulfamethoxazole-Tmp Ds]   . Diphenhydramine Hcl Hives    Past Medical History, Surgical history, Social history, and Family History were reviewed and updated.  Review of Systems: As above  Physical Exam:  height is 5\' 1"  (1.549 m) and weight is 141 lb (63.957 kg). Her oral temperature is 98.1 F (36.7 C). Her blood pressure is 131/63 and her pulse is 57. Her respiration is 14.   Well-developed well-nourished white female. Lungs are clear. Cardiac exam regular in rhythm. Abdomen soft. No palpable liver spleen tip. Back exam no tenderness. Extremities no clubbing cyanosis or edema. Neurological exam no focal neurological deficits. Skin exam no rashes. Head and neck exam shows no scleral icterus.  Lab Results  Component Value Date   WBC 6.6 05/26/2013   HGB 14.0 05/26/2013   HCT 43.8 05/26/2013   MCV 83 05/26/2013   PLT 225 05/26/2013     Chemistry      Component Value Date/Time   NA 144 05/26/2013 1202  NA 132* 03/20/2013 1407   K 4.1 05/26/2013 1202   K 3.7 03/20/2013 1407   CL 100 05/26/2013 1202   CL 97 03/20/2013 1407   CO2 32 05/26/2013 1202   CO2 26 03/20/2013 1407   BUN 12 05/26/2013 1202   BUN 12 03/20/2013 1407   CREATININE 0.8 05/26/2013 1202   CREATININE 0.65 03/20/2013 1407      Component Value Date/Time   CALCIUM 9.0 05/26/2013 1202   CALCIUM 9.2 03/20/2013 1407   ALKPHOS 97* 05/26/2013 1202   ALKPHOS 100 03/20/2013 1407   AST 43* 05/26/2013 1202   AST 44* 03/20/2013 1407   ALT 30 05/26/2013 1202   ALT 33 03/20/2013 1407   BILITOT 0.60 05/26/2013 1202   BILITOT 0.7 03/20/2013 1407          Impression and Plan: [name] is 63 year old white female with polycythemia. She also hepatitis C.  We do not need to phlebotomize her right now. So far she's done well well.  We will plan to get her back in another couple months.  Him whether the hepatitis C is now being treated.   [name], MD 4/7/20156:45 AM

## 2013-06-23 ENCOUNTER — Ambulatory Visit: Payer: 59 | Admitting: Interventional Cardiology

## 2013-06-30 ENCOUNTER — Ambulatory Visit: Payer: 59 | Admitting: Interventional Cardiology

## 2013-07-01 ENCOUNTER — Ambulatory Visit: Payer: 59 | Admitting: Interventional Cardiology

## 2013-07-04 ENCOUNTER — Encounter: Payer: Self-pay | Admitting: Interventional Cardiology

## 2013-07-17 ENCOUNTER — Telehealth: Payer: Self-pay

## 2013-07-17 MED ORDER — HYDROCHLOROTHIAZIDE 12.5 MG PO CAPS: 12.5000 mg | ORAL_CAPSULE | Freq: Every day | ORAL | Status: DC

## 2013-07-17 NOTE — Telephone Encounter (Signed)
Done

## 2013-07-23 ENCOUNTER — Other Ambulatory Visit (HOSPITAL_BASED_OUTPATIENT_CLINIC_OR_DEPARTMENT_OTHER): Payer: 59 | Admitting: Lab

## 2013-07-23 ENCOUNTER — Ambulatory Visit (HOSPITAL_BASED_OUTPATIENT_CLINIC_OR_DEPARTMENT_OTHER): Payer: 59 | Admitting: Hematology & Oncology

## 2013-07-23 VITALS — BP 141/72 | HR 67 | Temp 97.8°F | Resp 18 | Ht 61.0 in | Wt 141.0 lb

## 2013-07-23 DIAGNOSIS — D751 Secondary polycythemia: Secondary | ICD-10-CM

## 2013-07-23 DIAGNOSIS — D45 Polycythemia vera: Secondary | ICD-10-CM

## 2013-07-23 DIAGNOSIS — B1710 Acute hepatitis C without hepatic coma: Secondary | ICD-10-CM

## 2013-07-23 LAB — COMPREHENSIVE METABOLIC PANEL
ALBUMIN: 3.5 g/dL (ref 3.5–5.2)
ALT: 11 U/L (ref 0–35)
AST: 19 U/L (ref 0–37)
Alkaline Phosphatase: 114 U/L (ref 39–117)
BUN: 10 mg/dL (ref 6–23)
CALCIUM: 9.3 mg/dL (ref 8.4–10.5)
CHLORIDE: 99 meq/L (ref 96–112)
CO2: 30 meq/L (ref 19–32)
Creatinine, Ser: 0.82 mg/dL (ref 0.50–1.10)
Glucose, Bld: 121 mg/dL — ABNORMAL HIGH (ref 70–99)
Potassium: 4.4 mEq/L (ref 3.5–5.3)
Sodium: 136 mEq/L (ref 135–145)
TOTAL PROTEIN: 6.4 g/dL (ref 6.0–8.3)
Total Bilirubin: 1.1 mg/dL (ref 0.2–1.2)

## 2013-07-23 LAB — CBC WITH DIFFERENTIAL (CANCER CENTER ONLY)
BASO#: 0 10*3/uL (ref 0.0–0.2)
BASO%: 0.4 % (ref 0.0–2.0)
EOS%: 1 % (ref 0.0–7.0)
Eosinophils Absolute: 0.1 10*3/uL (ref 0.0–0.5)
HEMATOCRIT: 42.8 % (ref 34.8–46.6)
HEMOGLOBIN: 14.1 g/dL (ref 11.6–15.9)
LYMPH#: 1.8 10*3/uL (ref 0.9–3.3)
LYMPH%: 25.8 % (ref 14.0–48.0)
MCH: 27.2 pg (ref 26.0–34.0)
MCHC: 32.9 g/dL (ref 32.0–36.0)
MCV: 83 fL (ref 81–101)
MONO#: 0.7 10*3/uL (ref 0.1–0.9)
MONO%: 9.7 % (ref 0.0–13.0)
NEUT#: 4.3 10*3/uL (ref 1.5–6.5)
NEUT%: 63.1 % (ref 39.6–80.0)
Platelets: 225 10*3/uL (ref 145–400)
RBC: 5.19 10*6/uL (ref 3.70–5.32)
RDW: 17.4 % — AB (ref 11.1–15.7)
WBC: 6.9 10*3/uL (ref 3.9–10.0)

## 2013-07-23 NOTE — Progress Notes (Addendum)
Hematology and Oncology Follow Up Visit  [name] [id number] Apr 14, 1950 62 y.o. 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 2. Hepatitis C.  Current Therapy:   Phlebotomy to maintain hematocrit below 45%.  2. Aspirin 81 mg p.o. daily.     Interim History:  [name] is a for a followup. She now is on medication for the hepatitis C. she is worried about weight loss. She's not been trying to lose any weight. I am not sure as to what is going on. She does smoke. She is at risk for an underlying malignancy.  I feel that we would probably have to do a CT scan on her to see what is going on. Her alpha-fetoprotein has always been on the high side. We've never yet found a obvious malignancy but yet I think we do have to look at this possibility.  She's had a little cough. There is no hemoptysis. She's had no fever. She's had no rashes. She had no change in bowel or bladder habits.  Medications: Current outpatient prescriptions:albuterol (PROVENTIL HFA;VENTOLIN HFA) 108 (90 BASE) MCG/ACT inhaler, Inhale 2 puffs into the lungs every 6 (six) hours as needed for wheezing., Disp: 1 Inhaler, Rfl: 2;  ALPRAZolam (XANAX) 0.5 MG tablet, Take 0.5 mg by mouth at bedtime. , Disp: , Rfl: ;  aspirin EC 81 MG tablet, Take 1 tablet (81 mg total) by mouth daily., Disp: , Rfl: 3 clopidogrel (PLAVIX) 75 MG tablet, Take 1 tablet (75 mg total) by mouth daily., Disp: 30 tablet, Rfl: 5;  clotrimazole-betamethasone (LOTRISONE) cream, as needed, Disp: , Rfl: ;  Cyanocobalamin (VITAMIN B 12 PO), Take by mouth every morning., Disp: , Rfl: ;  escitalopram (LEXAPRO) 10 MG tablet, Take 5 mg by mouth daily. , Disp: , Rfl:  hydrochlorothiazide (MICROZIDE) 12.5 MG capsule, Take 1 capsule (12.5 mg total) by mouth daily., Disp: 30 capsule, Rfl: 1;  lansoprazole (PREVACID) 30 MG capsule, Take 30 mg by mouth daily as needed. Ulcer/ acid reflux, Disp: , Rfl: ;  metoprolol (TOPROL-XL) 100 MG 24 hr tablet,  Take 150 mg by mouth daily. , Disp: , Rfl: ;  Multiple Vitamin (MULTIVITAMIN WITH MINERALS) TABS, Take 1 tablet by mouth daily., Disp: , Rfl:  Ombitas-Paritapre-Ritona-Dasab (VIEKIRA PAK) 12.5-75-50 &250 MG TBPK, Take with a meal about same times each day: 2 pink tabs AND 1 beige tab every morning, 1 beige tab every evening, Disp: , Rfl: ;  vitamin C (ASCORBIC ACID) 500 MG tablet, Take 500 mg by mouth daily., Disp: , Rfl: ;  vitamin E 400 UNIT capsule, Take 400 Units by mouth daily., Disp: , Rfl:   Allergies:  Allergies  Allergen Reactions  . Bactrim [Sulfamethoxazole-Tmp Ds]   . Diphenhydramine Hcl Hives    Past Medical History, Surgical history, Social history, and Family History were reviewed and updated.  Review of Systems: As above  Physical Exam:  height is 5\' 1"  (1.549 m) and weight is 141 lb (63.957 kg). Her oral temperature is 97.8 F (36.6 C). Her blood pressure is 141/72 and her pulse is 67. Her respiration is 18.   Lungs are clear. Cardiac exam regular in rhythm. Head and neck exam shows no scleral icterus. There is no adenopathy on the neck. Abdomen is soft. She has good bowel sounds. Is no fluid wave. Is no palpable liver or spleen tip. Back exam no tenderness over the spine ribs or hips. Extremities shows no clubbing cyanosis or edema. Skin exam no rashes ecchymosis  or petechia.  Lab Results  Component Value Date   WBC 6.9 07/23/2013   HGB 14.1 07/23/2013   HCT 42.8 07/23/2013   MCV 83 07/23/2013   PLT 225 07/23/2013     Chemistry      Component Value Date/Time   NA 144 05/26/2013 1202   NA 132* 03/20/2013 1407   K 4.1 05/26/2013 1202   K 3.7 03/20/2013 1407   CL 100 05/26/2013 1202   CL 97 03/20/2013 1407   CO2 32 05/26/2013 1202   CO2 26 03/20/2013 1407   BUN 12 05/26/2013 1202   BUN 12 03/20/2013 1407   CREATININE 0.8 05/26/2013 1202   CREATININE 0.65 03/20/2013 1407      Component Value Date/Time   CALCIUM 9.0 05/26/2013 1202   CALCIUM 9.2 03/20/2013 1407   ALKPHOS 97* 05/26/2013  1202   ALKPHOS 100 03/20/2013 1407   AST 43* 05/26/2013 1202   AST 44* 03/20/2013 1407   ALT 30 05/26/2013 1202   ALT 33 03/20/2013 1407   BILITOT 0.60 05/26/2013 1202   BILITOT 0.7 03/20/2013 1407         Impression and Plan: [name] is a 63 year old white female with polycythemia. She is 89 phlebotomize right now.  She does have hepatitis C. She is being seen  out of [facility] for this. She will be going out to have another evaluation.  We will go ahead and plan for a CT scan. We'll try to get this done this week.  I'll plan to go back to see me in another couple months. If there is something on the CT scan, and we will followup sooner.   [name], MD 6/3/201512:33 PM

## 2013-07-25 ENCOUNTER — Ambulatory Visit (HOSPITAL_BASED_OUTPATIENT_CLINIC_OR_DEPARTMENT_OTHER): Admission: RE | Admit: 2013-07-25 | Payer: 59 | Source: Ambulatory Visit

## 2013-07-25 ENCOUNTER — Ambulatory Visit (HOSPITAL_BASED_OUTPATIENT_CLINIC_OR_DEPARTMENT_OTHER): Payer: 59

## 2013-07-28 ENCOUNTER — Ambulatory Visit (HOSPITAL_BASED_OUTPATIENT_CLINIC_OR_DEPARTMENT_OTHER): Payer: 59

## 2013-08-04 ENCOUNTER — Ambulatory Visit (HOSPITAL_BASED_OUTPATIENT_CLINIC_OR_DEPARTMENT_OTHER): Payer: 59

## 2013-08-04 ENCOUNTER — Ambulatory Visit (HOSPITAL_BASED_OUTPATIENT_CLINIC_OR_DEPARTMENT_OTHER)
Admission: RE | Admit: 2013-08-04 | Discharge: 2013-08-04 | Disposition: A | Discharge status: Home / Self Care | Payer: 59 | Source: Ambulatory Visit | Attending: Hematology & Oncology | Admitting: Hematology & Oncology

## 2013-08-04 DIAGNOSIS — R634 Abnormal weight loss: Secondary | ICD-10-CM | POA: Insufficient documentation

## 2013-08-04 DIAGNOSIS — B1710 Acute hepatitis C without hepatic coma: Secondary | ICD-10-CM | POA: Insufficient documentation

## 2013-08-04 DIAGNOSIS — D751 Secondary polycythemia: Secondary | ICD-10-CM

## 2013-08-04 DIAGNOSIS — R05 Cough: Secondary | ICD-10-CM | POA: Insufficient documentation

## 2013-08-04 DIAGNOSIS — F17200 Nicotine dependence, unspecified, uncomplicated: Secondary | ICD-10-CM | POA: Insufficient documentation

## 2013-08-04 DIAGNOSIS — R059 Cough, unspecified: Secondary | ICD-10-CM | POA: Insufficient documentation

## 2013-08-04 MED ORDER — IOHEXOL 300 MG/ML  SOLN
100.0000 mL | Freq: Once | INTRAMUSCULAR | Status: AC | PRN
  Administered 2013-08-04: 100 mL via INTRAVENOUS

## 2013-08-05 ENCOUNTER — Telehealth: Payer: Self-pay | Admitting: *Deleted

## 2013-08-05 NOTE — Telephone Encounter (Addendum)
Message copied by Lenn Sink on Tue Aug 05, 2013  9:51 AM ------      Message from: Burney Gauze R      Created: Mon Aug 04, 2013  5:20 PM       Call - no cancer seen!!!  Laurey Arrow ------Informed pt that no cancer was seen in CT

## 2013-08-21 ENCOUNTER — Telehealth: Payer: Self-pay | Admitting: *Deleted

## 2013-08-21 MED ORDER — METOPROLOL SUCCINATE ER 100 MG PO TB24: ORAL_TABLET | ORAL | Status: DC

## 2013-08-21 NOTE — Telephone Encounter (Signed)
Done

## 2013-08-21 NOTE — Telephone Encounter (Signed)
[facility] on [address] requests rx for metoprolol for patient. Thanks, MI

## 2013-09-18 ENCOUNTER — Other Ambulatory Visit: Payer: Self-pay | Admitting: Interventional Cardiology

## 2013-09-24 ENCOUNTER — Ambulatory Visit: Payer: 59 | Admitting: Hematology & Oncology

## 2013-09-24 ENCOUNTER — Other Ambulatory Visit: Payer: 59 | Admitting: Lab

## 2013-09-26 ENCOUNTER — Other Ambulatory Visit (HOSPITAL_BASED_OUTPATIENT_CLINIC_OR_DEPARTMENT_OTHER): Payer: 59 | Admitting: Lab

## 2013-09-26 ENCOUNTER — Encounter: Payer: Self-pay | Admitting: Hematology & Oncology

## 2013-09-26 ENCOUNTER — Ambulatory Visit (HOSPITAL_BASED_OUTPATIENT_CLINIC_OR_DEPARTMENT_OTHER): Payer: 59 | Admitting: Hematology & Oncology

## 2013-09-26 VITALS — BP 146/61 | HR 63 | Temp 97.9°F | Resp 14 | Ht 61.0 in | Wt 139.0 lb

## 2013-09-26 DIAGNOSIS — D45 Polycythemia vera: Secondary | ICD-10-CM

## 2013-09-26 DIAGNOSIS — B1920 Unspecified viral hepatitis C without hepatic coma: Secondary | ICD-10-CM

## 2013-09-26 DIAGNOSIS — B1710 Acute hepatitis C without hepatic coma: Secondary | ICD-10-CM

## 2013-09-26 DIAGNOSIS — D751 Secondary polycythemia: Secondary | ICD-10-CM

## 2013-09-26 DIAGNOSIS — F17200 Nicotine dependence, unspecified, uncomplicated: Secondary | ICD-10-CM

## 2013-09-26 DIAGNOSIS — R5381 Other malaise: Secondary | ICD-10-CM

## 2013-09-26 DIAGNOSIS — R5383 Other fatigue: Secondary | ICD-10-CM

## 2013-09-26 LAB — COMPREHENSIVE METABOLIC PANEL (CC13)
ALT: 12 U/L (ref 0–55)
ANION GAP: 12 meq/L — AB (ref 3–11)
AST: 24 U/L (ref 5–34)
Albumin: 3.4 g/dL — ABNORMAL LOW (ref 3.5–5.0)
Alkaline Phosphatase: 140 U/L (ref 40–150)
BILIRUBIN TOTAL: 1.35 mg/dL — AB (ref 0.20–1.20)
BUN: 17.2 mg/dL (ref 7.0–26.0)
CO2: 27 mEq/L (ref 22–29)
Calcium: 9.6 mg/dL (ref 8.4–10.4)
Chloride: 101 mEq/L (ref 98–109)
Creatinine: 0.8 mg/dL (ref 0.6–1.1)
GLUCOSE: 126 mg/dL (ref 70–140)
Potassium: 4 mEq/L (ref 3.5–5.1)
SODIUM: 140 meq/L (ref 136–145)
TOTAL PROTEIN: 6.9 g/dL (ref 6.4–8.3)

## 2013-09-26 LAB — CBC WITH DIFFERENTIAL (CANCER CENTER ONLY)
BASO#: 0 10*3/uL (ref 0.0–0.2)
BASO%: 0.5 % (ref 0.0–2.0)
EOS%: 1.8 % (ref 0.0–7.0)
Eosinophils Absolute: 0.1 10*3/uL (ref 0.0–0.5)
HCT: 44.8 % (ref 34.8–46.6)
HEMOGLOBIN: 15 g/dL (ref 11.6–15.9)
LYMPH#: 2.4 10*3/uL (ref 0.9–3.3)
LYMPH%: 35.7 % (ref 14.0–48.0)
MCH: 30.2 pg (ref 26.0–34.0)
MCHC: 33.5 g/dL (ref 32.0–36.0)
MCV: 90 fL (ref 81–101)
MONO#: 0.7 10*3/uL (ref 0.1–0.9)
MONO%: 10.4 % (ref 0.0–13.0)
NEUT%: 51.6 % (ref 39.6–80.0)
NEUTROS ABS: 3.4 10*3/uL (ref 1.5–6.5)
Platelets: 216 10*3/uL (ref 145–400)
RBC: 4.97 10*6/uL (ref 3.70–5.32)
RDW: 15.3 % (ref 11.1–15.7)
WBC: 6.7 10*3/uL (ref 3.9–10.0)

## 2013-09-26 LAB — LACTATE DEHYDROGENASE: LDH: 174 U/L (ref 94–250)

## 2013-09-26 LAB — AFP TUMOR MARKER: AFP TUMOR MARKER: 10.7 ng/mL — AB (ref 0.0–8.0)

## 2013-09-26 NOTE — Patient Instructions (Signed)
Smoking Cessation Quitting smoking is important to your health and has many advantages. However, it is not always easy to quit since nicotine is a very addictive drug. Oftentimes, people try 3 times or more before being able to quit. This document explains the best ways for you to prepare to quit smoking. Quitting takes hard work and a lot of effort, but you can do it. ADVANTAGES OF QUITTING SMOKING  You will live longer, feel better, and live better.  Your body will feel the impact of quitting smoking almost immediately.  Within 20 minutes, blood pressure decreases. Your pulse returns to its normal level.  After 8 hours, carbon monoxide levels in the blood return to normal. Your oxygen level increases.  After 24 hours, the chance of having a heart attack starts to decrease. Your breath, hair, and body stop smelling like smoke.  After 48 hours, damaged nerve endings begin to recover. Your sense of taste and smell improve.  After 72 hours, the body is virtually free of nicotine. Your bronchial tubes relax and breathing becomes easier.  After 2 to 12 weeks, lungs can hold more air. Exercise becomes easier and circulation improves.  The risk of having a heart attack, stroke, cancer, or lung disease is greatly reduced.  After 1 year, the risk of coronary heart disease is cut in half.  After 5 years, the risk of stroke falls to the same as a nonsmoker.  After 10 years, the risk of lung cancer is cut in half and the risk of other cancers decreases significantly.  After 15 years, the risk of coronary heart disease drops, usually to the level of a nonsmoker.  If you are pregnant, quitting smoking will improve your chances of having a healthy baby.  The people you live with, especially any children, will be healthier.  You will have extra money to spend on things other than cigarettes. QUESTIONS TO THINK ABOUT BEFORE ATTEMPTING TO QUIT You may want to talk about your answers with your  health care provider.  Why do you want to quit?  If you tried to quit in the past, what helped and what did not?  What will be the most difficult situations for you after you quit? How will you plan to handle them?  Who can help you through the tough times? Your family? Friends? A health care provider?  What pleasures do you get from smoking? What ways can you still get pleasure if you quit? Here are some questions to ask your health care provider:  How can you help me to be successful at quitting?  What medicine do you think would be best for me and how should I take it?  What should I do if I need more help?  What is smoking withdrawal like? How can I get information on withdrawal? GET READY  Set a quit date.  Change your environment by getting rid of all cigarettes, ashtrays, matches, and lighters in your home, car, or work. Do not let people smoke in your home.  Review your past attempts to quit. Think about what worked and what did not. GET SUPPORT AND ENCOURAGEMENT You have a better chance of being successful if you have help. You can get support in many ways.  Tell your family, friends, and coworkers that you are going to quit and need their support. Ask them not to smoke around you.  Get individual, group, or telephone counseling and support. Programs are available at local hospitals and health centers. Call   your local health department for information about programs in your area.  Spiritual beliefs and practices may help some smokers quit.  Download a "quit meter" on your computer to keep track of quit statistics, such as how long you have gone without smoking, cigarettes not smoked, and money saved.  Get a self-help book about quitting smoking and staying off tobacco. LEARN NEW SKILLS AND BEHAVIORS  Distract yourself from urges to smoke. Talk to someone, go for a walk, or occupy your time with a task.  Change your normal routine. Take a different route to work.  Drink tea instead of coffee. Eat breakfast in a different place.  Reduce your stress. Take a hot bath, exercise, or read a book.  Plan something enjoyable to do every day. Reward yourself for not smoking.  Explore interactive web-based programs that specialize in helping you quit. GET MEDICINE AND USE IT CORRECTLY Medicines can help you stop smoking and decrease the urge to smoke. Combining medicine with the above behavioral methods and support can greatly increase your chances of successfully quitting smoking.  Nicotine replacement therapy helps deliver nicotine to your body without the negative effects and risks of smoking. Nicotine replacement therapy includes nicotine gum, lozenges, inhalers, nasal sprays, and skin patches. Some may be available over-the-counter and others require a prescription.  Antidepressant medicine helps people abstain from smoking, but how this works is unknown. This medicine is available by prescription.  Nicotinic receptor partial agonist medicine simulates the effect of nicotine in your brain. This medicine is available by prescription. Ask your health care provider for advice about which medicines to use and how to use them based on your health history. Your health care provider will tell you what side effects to look out for if you choose to be on a medicine or therapy. Carefully read the information on the package. Do not use any other product containing nicotine while using a nicotine replacement product.  RELAPSE OR DIFFICULT SITUATIONS Most relapses occur within the first 3 months after quitting. Do not be discouraged if you start smoking again. Remember, most people try several times before finally quitting. You may have symptoms of withdrawal because your body is used to nicotine. You may crave cigarettes, be irritable, feel very hungry, cough often, get headaches, or have difficulty concentrating. The withdrawal symptoms are only temporary. They are strongest  when you first quit, but they will go away within 10-14 days. To reduce the chances of relapse, try to:  Avoid drinking alcohol. Drinking lowers your chances of successfully quitting.  Reduce the amount of caffeine you consume. Once you quit smoking, the amount of caffeine in your body increases and can give you symptoms, such as a rapid heartbeat, sweating, and anxiety.  Avoid smokers because they can make you want to smoke.  Do not let weight gain distract you. Many smokers will gain weight when they quit, usually less than 10 pounds. Eat a healthy diet and stay active. You can always lose the weight gained after you quit.  Find ways to improve your mood other than smoking. FOR MORE INFORMATION  www.smokefree.gov  Document Released: 01/31/2001 Document Revised: 06/23/2013 Document Reviewed: 05/18/2011 ExitCare Patient Information 2015 [employer]. This information is not intended to replace advice given to you by your health care provider. Make sure you discuss any questions you have with your health care provider.  

## 2013-09-27 NOTE — Progress Notes (Signed)
Hematology and Oncology Follow Up Visit  [name] [id number] 1950-11-05 63 y.o. 09/27/2013   Principle Diagnosis:  1. 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 2. Hepatitis C  Current Therapy:   Phlebotomy to maintain hematocrit below 45%.     Interim History:  Ms.  [name] is back for followup. She still appeared good. She now is on medicine for the hepatitis C. She says that her hepatitis C titers has come down from over 3 million down to 15. She says she has another week left of treatment. Treatment is not cause any problems for her. She may have little bit of fatigue but that is all she has noted.  She's still smoking. She tried to cut back on this. She is still working. She also would like to retire but can't because she needs insurance.  She is getting ready on vacation with her family. [place] ocean.  Her alpha-fetoprotein has always been on the higher side. We checked it today and it was 10.7.  Her last CT scan was done of the chest and abdomen was back in June. There is a looked okay with no evidence of malignancy.  She's had no bleeding. She's had no fever. She's had no productive cough. No leg swelling. She had no rashes.  Medications: Current outpatient prescriptions:albuterol (PROVENTIL HFA;VENTOLIN HFA) 108 (90 BASE) MCG/ACT inhaler, Inhale 2 puffs into the lungs every 6 (six) hours as needed for wheezing., Disp: 1 Inhaler, Rfl: 2;  ALPRAZolam (XANAX) 0.5 MG tablet, Take 0.5 mg by mouth at bedtime. , Disp: , Rfl: ;  aspirin 325 MG EC tablet, Take 325 mg by mouth daily., Disp: , Rfl: ;  clotrimazole-betamethasone (LOTRISONE) cream, as needed, Disp: , Rfl:  Cyanocobalamin (VITAMIN B 12 PO), Take by mouth every morning., Disp: , Rfl: ;  escitalopram (LEXAPRO) 10 MG tablet, Take 5 mg by mouth daily. , Disp: , Rfl: ;  hydrochlorothiazide (MICROZIDE) 12.5 MG capsule, TAKE 1 CAPSULE (12.5 MG TOTAL) BY MOUTH DAILY., Disp: 30 capsule, Rfl: 1;  lansoprazole (PREVACID) 30 MG  capsule, Take 30 mg by mouth daily as needed. Ulcer/ acid reflux, Disp: , Rfl:  metoprolol succinate (TOPROL-XL) 100 MG 24 hr tablet, Take 1 and 1/2 tablet daily, Disp: 45 tablet, Rfl: 0;  Multiple Vitamin (MULTIVITAMIN WITH MINERALS) TABS, Take 1 tablet by mouth daily., Disp: , Rfl: ;  Ombitas-Paritapre-Ritona-Dasab (VIEKIRA PAK) 12.5-75-50 &250 MG TBPK, Take with a meal about same times each day: 2 pink tabs AND 1 beige tab every morning, 1 beige tab every evening, Disp: , Rfl:  vitamin C (ASCORBIC ACID) 500 MG tablet, Take 500 mg by mouth daily., Disp: , Rfl: ;  vitamin E 400 UNIT capsule, Take 400 Units by mouth daily., Disp: , Rfl: ;  clopidogrel (PLAVIX) 75 MG tablet, Take 75 mg by mouth daily. 09-22-13  ON HOLD, Disp: , Rfl:   Allergies:  Allergies  Allergen Reactions  . Bactrim [Sulfamethoxazole-Tmp Ds]   . Diphenhydramine Hcl Hives    Past Medical History, Surgical history, Social history, and Family History were reviewed and updated.  Review of Systems: As above  Physical Exam:  height is 5\' 1"  (1.549 m) and weight is 139 lb (63.05 kg). Her oral temperature is 97.9 F (36.6 C). Her blood pressure is 146/61 and her pulse is 63. Her respiration is 14.   Well-developed and well-nourished white female. Head and neck exam shows Dr. or oral lesions. She has no palpable cervical or supraclavicular lymph nodes.  Lungs are clear. Cardiac exam regular in rhythm with no murmurs rubs or bruits. Abdomen is soft. She is no distention. There is no ascites. She has no palpable liver or spleen tip. Back exam no tenderness over the spine ribs or hips. Extremities shows no clubbing cyanosis or edema. Neurological exam shows no focal neurological deficits.  Lab Results  Component Value Date   WBC 6.7 09/26/2013   HGB 15.0 09/26/2013   HCT 44.8 09/26/2013   MCV 90 09/26/2013   PLT 216 09/26/2013     Chemistry      Component Value Date/Time   NA 140 09/26/2013 1106   NA 136 07/23/2013 1109   NA 144 05/26/2013  1202   K 4.0 09/26/2013 1106   K 4.4 07/23/2013 1109   K 4.1 05/26/2013 1202   CL 99 07/23/2013 1109   CL 100 05/26/2013 1202   CO2 27 09/26/2013 1106   CO2 30 07/23/2013 1109   CO2 32 05/26/2013 1202   BUN 17.2 09/26/2013 1106   BUN 10 07/23/2013 1109   BUN 12 05/26/2013 1202   CREATININE 0.8 09/26/2013 1106   CREATININE 0.82 07/23/2013 1109   CREATININE 0.8 05/26/2013 1202      Component Value Date/Time   CALCIUM 9.6 09/26/2013 1106   CALCIUM 9.3 07/23/2013 1109   CALCIUM 9.0 05/26/2013 1202   ALKPHOS 140 09/26/2013 1106   ALKPHOS 114 07/23/2013 1109   ALKPHOS 97* 05/26/2013 1202   AST 24 09/26/2013 1106   AST 19 07/23/2013 1109   AST 43* 05/26/2013 1202   ALT 12 09/26/2013 1106   ALT 11 07/23/2013 1109   ALT 30 05/26/2013 1202   BILITOT 1.35* 09/26/2013 1106   BILITOT 1.1 07/23/2013 1109   BILITOT 0.60 05/26/2013 1202         Impression and Plan: [name] is 63 year old white female with polycythemia. She also has hepatitis C. Hepatitis C she is doing a lot better. I think that as the hepatitis C improved, so will her alpha-fetoprotein.Marland Kitchen  Also think that as the hepatitis C improved, so will her polycythemia. She's cut back on smoking. I do this also will improve her polycythemia and that she hopefully will be phlebotomized very infrequently.  She is 89 be phlebotomized right now.  I want to hear back in about 6 weeks. By that, I would be surprised if she did not had be phlebotomized.  I Am glad that she fine is getting treatment for hepatitis C. This took  a very long time to get her treatment because of insurance issues.  [name], MD 8/8/20159:47 AM

## 2013-10-14 ENCOUNTER — Other Ambulatory Visit: Payer: Self-pay | Admitting: Interventional Cardiology

## 2013-11-03 ENCOUNTER — Ambulatory Visit (HOSPITAL_COMMUNITY): Payer: 59 | Attending: Interventional Cardiology | Admitting: Cardiology

## 2013-11-03 DIAGNOSIS — E785 Hyperlipidemia, unspecified: Secondary | ICD-10-CM | POA: Insufficient documentation

## 2013-11-03 DIAGNOSIS — I6529 Occlusion and stenosis of unspecified carotid artery: Secondary | ICD-10-CM

## 2013-11-03 DIAGNOSIS — I2510 Atherosclerotic heart disease of native coronary artery without angina pectoris: Secondary | ICD-10-CM | POA: Insufficient documentation

## 2013-11-03 DIAGNOSIS — F17200 Nicotine dependence, unspecified, uncomplicated: Secondary | ICD-10-CM | POA: Diagnosis not present

## 2013-11-03 DIAGNOSIS — I10 Essential (primary) hypertension: Secondary | ICD-10-CM | POA: Insufficient documentation

## 2013-11-03 NOTE — Progress Notes (Signed)
Carotid duplex performed 

## 2013-11-05 ENCOUNTER — Telehealth: Payer: Self-pay

## 2013-11-05 DIAGNOSIS — I6523 Occlusion and stenosis of bilateral carotid arteries: Secondary | ICD-10-CM

## 2013-11-05 NOTE — Telephone Encounter (Signed)
Message copied by Lamar Laundry on Wed Nov 05, 2013 12:12 PM ------      Message from: Daneen Schick      Created: Tue Nov 04, 2013  6:06 PM       Stable carotid doppler. Left worse than left. F/u in 6 months ------

## 2013-11-06 ENCOUNTER — Ambulatory Visit (HOSPITAL_BASED_OUTPATIENT_CLINIC_OR_DEPARTMENT_OTHER): Payer: 59 | Admitting: Hematology & Oncology

## 2013-11-06 ENCOUNTER — Encounter: Payer: Self-pay | Admitting: Hematology & Oncology

## 2013-11-06 ENCOUNTER — Other Ambulatory Visit (HOSPITAL_BASED_OUTPATIENT_CLINIC_OR_DEPARTMENT_OTHER): Payer: 59 | Admitting: Lab

## 2013-11-06 VITALS — BP 128/83 | HR 65 | Temp 98.3°F | Resp 14 | Ht 61.0 in | Wt 137.0 lb

## 2013-11-06 DIAGNOSIS — B1920 Unspecified viral hepatitis C without hepatic coma: Secondary | ICD-10-CM

## 2013-11-06 DIAGNOSIS — D751 Secondary polycythemia: Secondary | ICD-10-CM

## 2013-11-06 DIAGNOSIS — B1710 Acute hepatitis C without hepatic coma: Secondary | ICD-10-CM

## 2013-11-06 LAB — CBC WITH DIFFERENTIAL (CANCER CENTER ONLY)
BASO#: 0 10*3/uL (ref 0.0–0.2)
BASO%: 0.3 % (ref 0.0–2.0)
EOS%: 1.3 % (ref 0.0–7.0)
Eosinophils Absolute: 0.1 10*3/uL (ref 0.0–0.5)
HEMATOCRIT: 44.9 % (ref 34.8–46.6)
HGB: 15.3 g/dL (ref 11.6–15.9)
LYMPH#: 1.8 10*3/uL (ref 0.9–3.3)
LYMPH%: 26.7 % (ref 14.0–48.0)
MCH: 30.9 pg (ref 26.0–34.0)
MCHC: 34.1 g/dL (ref 32.0–36.0)
MCV: 91 fL (ref 81–101)
MONO#: 0.7 10*3/uL (ref 0.1–0.9)
MONO%: 9.7 % (ref 0.0–13.0)
NEUT#: 4.3 10*3/uL (ref 1.5–6.5)
NEUT%: 62 % (ref 39.6–80.0)
PLATELETS: 228 10*3/uL (ref 145–400)
RBC: 4.95 10*6/uL (ref 3.70–5.32)
RDW: 12.8 % (ref 11.1–15.7)
WBC: 6.9 10*3/uL (ref 3.9–10.0)

## 2013-11-06 LAB — COMPREHENSIVE METABOLIC PANEL
ALT: 17 U/L (ref 0–35)
AST: 24 U/L (ref 0–37)
Albumin: 3.7 g/dL (ref 3.5–5.2)
Alkaline Phosphatase: 93 U/L (ref 39–117)
BILIRUBIN TOTAL: 0.6 mg/dL (ref 0.2–1.2)
BUN: 12 mg/dL (ref 6–23)
CALCIUM: 9.3 mg/dL (ref 8.4–10.5)
CHLORIDE: 98 meq/L (ref 96–112)
CO2: 28 meq/L (ref 19–32)
CREATININE: 0.75 mg/dL (ref 0.50–1.10)
Glucose, Bld: 99 mg/dL (ref 70–99)
Potassium: 4.8 mEq/L (ref 3.5–5.3)
Sodium: 133 mEq/L — ABNORMAL LOW (ref 135–145)
TOTAL PROTEIN: 6.6 g/dL (ref 6.0–8.3)

## 2013-11-07 ENCOUNTER — Ambulatory Visit (INDEPENDENT_AMBULATORY_CARE_PROVIDER_SITE_OTHER): Payer: 59 | Admitting: Interventional Cardiology

## 2013-11-07 ENCOUNTER — Ambulatory Visit: Payer: 59 | Admitting: Interventional Cardiology

## 2013-11-07 ENCOUNTER — Encounter: Payer: Self-pay | Admitting: Interventional Cardiology

## 2013-11-07 VITALS — BP 138/76 | HR 65 | Ht 60.0 in | Wt 137.0 lb

## 2013-11-07 DIAGNOSIS — I6523 Occlusion and stenosis of bilateral carotid arteries: Secondary | ICD-10-CM

## 2013-11-07 DIAGNOSIS — I2510 Atherosclerotic heart disease of native coronary artery without angina pectoris: Secondary | ICD-10-CM

## 2013-11-07 DIAGNOSIS — I658 Occlusion and stenosis of other precerebral arteries: Secondary | ICD-10-CM

## 2013-11-07 DIAGNOSIS — I6529 Occlusion and stenosis of unspecified carotid artery: Secondary | ICD-10-CM

## 2013-11-07 DIAGNOSIS — J410 Simple chronic bronchitis: Secondary | ICD-10-CM

## 2013-11-07 DIAGNOSIS — I10 Essential (primary) hypertension: Secondary | ICD-10-CM

## 2013-11-07 DIAGNOSIS — D45 Polycythemia vera: Secondary | ICD-10-CM

## 2013-11-07 LAB — IRON AND TIBC CHCC
%SAT: 18 % — AB (ref 21–57)
IRON: 75 ug/dL (ref 41–142)
TIBC: 423 ug/dL (ref 236–444)
UIBC: 347 ug/dL (ref 120–384)

## 2013-11-07 LAB — FERRITIN CHCC: Ferritin: 22 ng/ml (ref 9–269)

## 2013-11-07 MED ORDER — NITROGLYCERIN 0.4 MG SL SUBL: 0.4000 mg | SUBLINGUAL_TABLET | SUBLINGUAL | Status: DC | PRN

## 2013-11-07 NOTE — Progress Notes (Addendum)
Patient ID: [name], female   DOB: 02/25/1950, 63 y.o.   MRN: [id number]    [address], [address] [place], [place]  [address] Phone: [phone] Fax:  [phone]  Date:  11/07/2013   ID:  [name], DOB 1950/03/04, MRN [id number]  PCP:  Wenda Low, MD   ASSESSMENT:  1. Coronary artery disease, asymptomatic 2. Carotid disease, stable with bilateral 60% stenoses 3. Hypertension, stable 4. Tobacco abuse 5. Fatigue and excessive daytime sleepiness  PLAN:  1. Discontinue cigarette use 2. Clinical followup with me in one year 3. Continue to followup carotid disease with every six-month Doppler 4. Aerobic exercise 5. Consider sleep apnea   SUBJECTIVE: [name] is a 63 y.o. female who has no cardiopulmonary complaints except occasional fleeting chest pain. She does not have nitroglycerin. She has used nitroglycerin once over the past year since I last saw her. She denies claudication. She has not had neurological complaints. No episodes of syncope. She denies palpitations.   Wt Readings from Last 3 Encounters:  11/07/13 137 lb (62.143 kg)  11/06/13 137 lb (62.143 kg)  09/26/13 139 lb (63.05 kg)     Past Medical History  Diagnosis Date  . Hepatitis C   . Hypertension   . Polycythemia 2009  . CAD (coronary artery disease)   . Arthritis   . GERD (gastroesophageal reflux disease)     Current Outpatient Prescriptions  Medication Sig Dispense Refill  . albuterol (PROVENTIL HFA;VENTOLIN HFA) 108 (90 BASE) MCG/ACT inhaler Inhale 2 puffs into the lungs every 6 (six) hours as needed for wheezing.  1 Inhaler  2  . ALPRAZolam (XANAX) 0.5 MG tablet Take 0.5 mg by mouth at bedtime.       Marland Kitchen aspirin 325 MG EC tablet Take 325 mg by mouth daily.      . clopidogrel (PLAVIX) 75 MG tablet Take 75 mg by mouth daily. 09-22-13  ON HOLD      . clotrimazole-betamethasone (LOTRISONE) cream as needed      . Cyanocobalamin (VITAMIN B 12 PO) Take by mouth every morning.        . escitalopram (LEXAPRO) 10 MG tablet Take 5 mg by mouth daily.       . hydrochlorothiazide (MICROZIDE) 12.5 MG capsule TAKE 1 CAPSULE (12.5 MG TOTAL) BY MOUTH DAILY.  30 capsule  1  . lansoprazole (PREVACID) 30 MG capsule Take 30 mg by mouth daily as needed. Ulcer/ acid reflux      . metoprolol succinate (TOPROL-XL) 100 MG 24 hr tablet TAKE 1 & 1/2 TABLETS EVERY DAY  45 tablet  0  . Multiple Vitamin (MULTIVITAMIN WITH MINERALS) TABS Take 1 tablet by mouth daily.      . vitamin C (ASCORBIC ACID) 500 MG tablet Take 500 mg by mouth daily.      . vitamin E 400 UNIT capsule Take 400 Units by mouth daily.       No current facility-administered medications for this visit.    Allergies:    Allergies  Allergen Reactions  . Bactrim [Sulfamethoxazole-Tmp Ds]   . Diphenhydramine Hcl Hives    Social History:  The patient  reports that she has been smoking Cigarettes.  She started smoking about 45 years ago. She has a 22.5 pack-year smoking history. She has never used smokeless tobacco. She reports that she does not drink alcohol or use illicit drugs.   ROS:  Please see the history of present illness.   Last stress evaluation  was October 2014 at [facility], low risk. Still smoking cigarettes. No neurological complaints. She denies claudication. She feels tired all the time. She wakes up feeling tired. This is a falsely during the day to   All other systems reviewed and negative.   OBJECTIVE: VS:  BP 138/76  Pulse 65  Ht 5' (1.524 m)  Wt 137 lb (62.143 kg)  BMI 26.76 kg/m2 Well nourished, well developed, in no acute distress, with heavy aroma of stale cigarette smoke HEENT: normal Neck: JVD flat. Carotid bruit absent  Cardiac:  normal S1, S2; RRR; no murmur Lungs:  clear to auscultation bilaterally, no wheezing, rhonchi or rales Abd: soft, nontender, no hepatomegaly Ext: Edema absent. Pulses 2+ and symmetric bilateral posterior tibial Skin: warm and dry Neuro:  CNs 2-12 intact, no focal  abnormalities noted  EKG:  Normal sinus rhythm with poor R-wave progression. No change from prior.    Signed, Illene Labrador III, MD 11/07/2013 11:54 AM  .

## 2013-11-07 NOTE — Patient Instructions (Signed)
Your physician recommends that you continue on your current medications as directed. Please refer to the Current Medication list given to you today.  An Rx for Nitro has been sent to your pharmacy. To be used as directed   Dr.Smith recommends that you STOP SMOKING.Your physician discussed the hazards of tobacco use. Tobacco use cessation is recommended and techniques and options to help you quit were discussed.  Your physician wants you to follow-up in: 1 year with Dr.Smith You will receive a reminder letter in the mail two months in advance. If you don't receive a letter, please call our office to schedule the follow-up appointment.

## 2013-11-07 NOTE — Progress Notes (Signed)
Hematology and Oncology Follow Up Visit  RUTHIE BERCH [id number] 07-10-1950 63 y.o. 11/07/2013   Principle Diagnosis:   Polycythemia vera- JAK2 negative Hepatitis  C-clinical remission       Current Therapy:    Phlebotomy to maintain hematocrit less than 45%  Aspirin 325 mg by mouth daily     Interim History:  Ms.  Roberge is back for followup. She feels quite well. She is off her hepatitis therapy. She is in remission now. She says her last viral titers were not detectable.  She is still working. She we go into the mountains this weekend. She will be seeing her mother that there.  she's had no headache. She's had no cough. She is still smoking.  She's had no change in bowel or bladder habits. She's had no fever. She's had no nausea or vomiting.  There's been no rashes. She's had no leg swelling. She has had no weight loss or weight gain. She's trying to watch what she eats a little bit better.  Medications: Current outpatient prescriptions:albuterol (PROVENTIL HFA;VENTOLIN HFA) 108 (90 BASE) MCG/ACT inhaler, Inhale 2 puffs into the lungs every 6 (six) hours as needed for wheezing., Disp: 1 Inhaler, Rfl: 2;  ALPRAZolam (XANAX) 0.5 MG tablet, Take 0.5 mg by mouth at bedtime. , Disp: , Rfl: ;  aspirin 325 MG EC tablet, Take 325 mg by mouth daily., Disp: , Rfl: ;  clopidogrel (PLAVIX) 75 MG tablet, Take 75 mg by mouth daily. 09-22-13  ON HOLD, Disp: , Rfl:  clotrimazole-betamethasone (LOTRISONE) cream, as needed, Disp: , Rfl: ;  Cyanocobalamin (VITAMIN B 12 PO), Take by mouth every morning., Disp: , Rfl: ;  escitalopram (LEXAPRO) 10 MG tablet, Take 5 mg by mouth daily. , Disp: , Rfl: ;  hydrochlorothiazide (MICROZIDE) 12.5 MG capsule, TAKE 1 CAPSULE (12.5 MG TOTAL) BY MOUTH DAILY., Disp: 30 capsule, Rfl: 1 lansoprazole (PREVACID) 30 MG capsule, Take 30 mg by mouth daily as needed. Ulcer/ acid reflux, Disp: , Rfl: ;  metoprolol succinate (TOPROL-XL) 100 MG 24 hr tablet, TAKE 1 & 1/2  TABLETS EVERY DAY, Disp: 45 tablet, Rfl: 0;  Multiple Vitamin (MULTIVITAMIN WITH MINERALS) TABS, Take 1 tablet by mouth daily., Disp: , Rfl: ;  vitamin C (ASCORBIC ACID) 500 MG tablet, Take 500 mg by mouth daily., Disp: , Rfl:  vitamin E 400 UNIT capsule, Take 400 Units by mouth daily., Disp: , Rfl:   Allergies:  Allergies  Allergen Reactions  . Bactrim [Sulfamethoxazole-Tmp Ds]   . Diphenhydramine Hcl Hives    Past Medical History, Surgical history, Social history, and Family History were reviewed and updated.  Review of Systems: As above  Physical Exam:  height is 5\' 1"  (1.549 m) and weight is 137 lb (62.143 kg). Her oral temperature is 98.3 F (36.8 C). Her blood pressure is 128/83 and her pulse is 65. Her respiration is 14.   Well-developed well-nourished white female. Head and exam shows no ocular or oral lesions. She has no scleral icterus. There is no adenopathy in the neck. Lungs are clear. Cardiac exam regular in rhythm with no murmurs, rubs or bruits. Abdomen soft. Has good bowel sounds. There is no fluid wave. There is no palpable liver or spleen tip. There is a palpable abdominal mass. Back exam no tenderness over the spine, ribs or hips. Extremities shows no clubbing, cyanosis or edema. Neurological exam shows no focal neurological deficits. Skin exam no rashes, ecchymosis or petechia.  Lab Results  Component Value Date  WBC 6.9 11/06/2013   HGB 15.3 11/06/2013   HCT 44.9 11/06/2013   MCV 91 11/06/2013   PLT 228 11/06/2013     Chemistry      Component Value Date/Time   NA 133* 11/06/2013 1126   NA 140 09/26/2013 1106   NA 144 05/26/2013 1202   K 4.8 11/06/2013 1126   K 4.0 09/26/2013 1106   K 4.1 05/26/2013 1202   CL 98 11/06/2013 1126   CL 100 05/26/2013 1202   CO2 28 11/06/2013 1126   CO2 27 09/26/2013 1106   CO2 32 05/26/2013 1202   BUN 12 11/06/2013 1126   BUN 17.2 09/26/2013 1106   BUN 12 05/26/2013 1202   CREATININE 0.75 11/06/2013 1126   CREATININE 0.8 09/26/2013 1106    CREATININE 0.8 05/26/2013 1202      Component Value Date/Time   CALCIUM 9.3 11/06/2013 1126   CALCIUM 9.6 09/26/2013 1106   CALCIUM 9.0 05/26/2013 1202   ALKPHOS 93 11/06/2013 1126   ALKPHOS 140 09/26/2013 1106   ALKPHOS 97* 05/26/2013 1202   AST 24 11/06/2013 1126   AST 24 09/26/2013 1106   AST 43* 05/26/2013 1202   ALT 17 11/06/2013 1126   ALT 12 09/26/2013 1106   ALT 30 05/26/2013 1202   BILITOT 0.6 11/06/2013 1126   BILITOT 1.35* 09/26/2013 1106   BILITOT 0.60 05/26/2013 1202         Impression and Plan: Ms. Leachman is 63 year old female. She is polycythemia. She's done very well with this. She's had no complications from this, to date.  She does not is being phlebotomized yet. Has been quite a while since she has had a phlebotomy. I think with her hepatitis C being treated, I will I think that the hemoglobin will improve.  I will plan to get her back to see [place] in another couple of months.   [name], MD 9/18/20156:54 AM

## 2013-11-08 LAB — HEPATITIS C RNA QUANTITATIVE: HCV Quantitative: NOT DETECTED IU/mL (ref <15)

## 2013-11-11 ENCOUNTER — Ambulatory Visit: Payer: 59 | Admitting: Interventional Cardiology

## 2013-11-15 ENCOUNTER — Other Ambulatory Visit: Payer: Self-pay | Admitting: Interventional Cardiology

## 2013-11-16 ENCOUNTER — Other Ambulatory Visit: Payer: Self-pay | Admitting: Interventional Cardiology

## 2014-01-05 ENCOUNTER — Ambulatory Visit (HOSPITAL_BASED_OUTPATIENT_CLINIC_OR_DEPARTMENT_OTHER): Payer: 59

## 2014-01-05 ENCOUNTER — Telehealth: Payer: Self-pay | Admitting: Hematology & Oncology

## 2014-01-05 ENCOUNTER — Ambulatory Visit (HOSPITAL_BASED_OUTPATIENT_CLINIC_OR_DEPARTMENT_OTHER): Payer: 59 | Admitting: Hematology & Oncology

## 2014-01-05 ENCOUNTER — Encounter: Payer: Self-pay | Admitting: Hematology & Oncology

## 2014-01-05 ENCOUNTER — Other Ambulatory Visit (HOSPITAL_BASED_OUTPATIENT_CLINIC_OR_DEPARTMENT_OTHER): Payer: 59 | Admitting: Lab

## 2014-01-05 VITALS — BP 140/69 | HR 62 | Temp 98.1°F | Resp 14 | Ht 60.0 in | Wt 139.0 lb

## 2014-01-05 DIAGNOSIS — B1920 Unspecified viral hepatitis C without hepatic coma: Secondary | ICD-10-CM

## 2014-01-05 DIAGNOSIS — J441 Chronic obstructive pulmonary disease with (acute) exacerbation: Secondary | ICD-10-CM

## 2014-01-05 DIAGNOSIS — D751 Secondary polycythemia: Secondary | ICD-10-CM

## 2014-01-05 DIAGNOSIS — B1710 Acute hepatitis C without hepatic coma: Secondary | ICD-10-CM

## 2014-01-05 DIAGNOSIS — K921 Melena: Secondary | ICD-10-CM

## 2014-01-05 DIAGNOSIS — Z23 Encounter for immunization: Secondary | ICD-10-CM

## 2014-01-05 LAB — CMP (CANCER CENTER ONLY)
ALBUMIN: 3.5 g/dL (ref 3.3–5.5)
ALT(SGPT): 17 U/L (ref 10–47)
AST: 23 U/L (ref 11–38)
Alkaline Phosphatase: 78 U/L (ref 26–84)
BUN: 12 mg/dL (ref 7–22)
CO2: 28 mEq/L (ref 18–33)
Calcium: 9.2 mg/dL (ref 8.0–10.3)
Chloride: 96 mEq/L — ABNORMAL LOW (ref 98–108)
Creat: 0.8 mg/dl (ref 0.6–1.2)
Glucose, Bld: 107 mg/dL (ref 73–118)
POTASSIUM: 4 meq/L (ref 3.3–4.7)
Sodium: 139 mEq/L (ref 128–145)
Total Bilirubin: 0.7 mg/dl (ref 0.20–1.60)
Total Protein: 7.1 g/dL (ref 6.4–8.1)

## 2014-01-05 LAB — CBC WITH DIFFERENTIAL (CANCER CENTER ONLY)
BASO#: 0 10*3/uL (ref 0.0–0.2)
BASO%: 0.5 % (ref 0.0–2.0)
EOS%: 1.1 % (ref 0.0–7.0)
Eosinophils Absolute: 0.1 10*3/uL (ref 0.0–0.5)
HEMATOCRIT: 45.9 % (ref 34.8–46.6)
HEMOGLOBIN: 15.4 g/dL (ref 11.6–15.9)
LYMPH#: 1.7 10*3/uL (ref 0.9–3.3)
LYMPH%: 26.2 % (ref 14.0–48.0)
MCH: 31 pg (ref 26.0–34.0)
MCHC: 33.6 g/dL (ref 32.0–36.0)
MCV: 92 fL (ref 81–101)
MONO#: 0.7 10*3/uL (ref 0.1–0.9)
MONO%: 10.2 % (ref 0.0–13.0)
NEUT%: 62 % (ref 39.6–80.0)
NEUTROS ABS: 4 10*3/uL (ref 1.5–6.5)
Platelets: 226 10*3/uL (ref 145–400)
RBC: 4.97 10*6/uL (ref 3.70–5.32)
RDW: 12.8 % (ref 11.1–15.7)
WBC: 6.5 10*3/uL (ref 3.9–10.0)

## 2014-01-05 MED ORDER — INFLUENZA VAC SPLIT QUAD 0.5 ML IM SUSY
0.5000 mL | PREFILLED_SYRINGE | Freq: Once | INTRAMUSCULAR | Status: AC
  Administered 2014-01-05: 0.5 mL via INTRAMUSCULAR
  Filled 2014-01-05: qty 0.5

## 2014-01-05 NOTE — Telephone Encounter (Signed)
Pt aware of 11-24 appointment

## 2014-01-05 NOTE — Progress Notes (Signed)
Hematology and Oncology Follow Up Visit  TANDRA ROSADO [id number] 12-07-1950 63 y.o. 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  Augusta is back for followup. She feels a little bit tired. She is off her hepatitis therapy. She is in remission now. She says her last viral titers were not detectable. She goes to [facility] this week.   She is still working. She we go into the mountains this weekend. She will be seeing her mother that there.  she's had no headache. She's had no cough. She is still smoking.  She's had no change in bowel or bladder habits. She's had no fever. She's had no nausea or vomiting.  There's been no rashes. She's had no leg swelling. She has had some weight gain. She's trying to watch what she eats a little bit better.  She's tried to exercise a little bit more.    Her last ferritin back in September was 22 with an iron saturation 18%.  Medications: Current outpatient prescriptions: albuterol (PROVENTIL HFA;VENTOLIN HFA) 108 (90 BASE) MCG/ACT inhaler, Inhale 2 puffs into the lungs every 6 (six) hours as needed for wheezing., Disp: 1 Inhaler, Rfl: 2;  ALPRAZolam (XANAX) 0.5 MG tablet, Take 0.5 mg by mouth at bedtime. , Disp: , Rfl: ;  clopidogrel (PLAVIX) 75 MG tablet, Take 75 mg by mouth daily. 09-22-13  ON HOLD, Disp: , Rfl:  clotrimazole-betamethasone (LOTRISONE) cream, as needed, Disp: , Rfl: ;  Cyanocobalamin (VITAMIN B 12 PO), Take by mouth every morning., Disp: , Rfl: ;  escitalopram (LEXAPRO) 10 MG tablet, Take 5 mg by mouth daily. , Disp: , Rfl: ;  hydrochlorothiazide (MICROZIDE) 12.5 MG capsule, TAKE 1 CAPSULE (12.5 MG TOTAL) BY MOUTH DAILY., Disp: 30 capsule, Rfl: 10 lansoprazole (PREVACID) 30 MG capsule, Take 30 mg by mouth daily as needed. Ulcer/ acid  reflux, Disp: , Rfl: ;  metoprolol succinate (TOPROL-XL) 100 MG 24 hr tablet, TAKE 1 & 1/2 TABLETS EVERY DAY, Disp: 45 tablet, Rfl: 10;  Multiple Vitamin (MULTIVITAMIN WITH MINERALS) TABS, Take 1 tablet by mouth daily., Disp: , Rfl: ;  vitamin C (ASCORBIC ACID) 500 MG tablet, Take 500 mg by mouth daily., Disp: , Rfl:  vitamin E 400 UNIT capsule, Take 400 Units by mouth daily., Disp: , Rfl: ;  nitroGLYCERIN (NITROSTAT) 0.4 MG SL tablet, Place 1 tablet (0.4 mg total) under the tongue every 5 (five) minutes as needed., Disp: 25 tablet, Rfl: 3  Allergies:  Allergies  Allergen Reactions  . Bactrim [Sulfamethoxazole-Trimethoprim]   . Diphenhydramine Hcl Hives    Past Medical History, Surgical history, Social history, and Family History were reviewed and updated.  Review of Systems: As above  Physical Exam:  height is 5' (1.524 m) and weight is 139 lb (63.05 kg). Her oral temperature is 98.1 F (36.7 C). Her blood pressure is 140/69 and her pulse is 62. Her respiration is 14.   Well-developed well-nourished white female. Head and exam shows no ocular or oral lesions. She has no scleral icterus. There is no adenopathy in the neck. Lungs are clear. Cardiac exam regular in rhythm with no murmurs, rubs or bruits. Abdomen soft. Has good bowel sounds. There is no fluid wave. There is no palpable liver or spleen tip. There is a  palpable abdominal mass. Back exam no tenderness over the spine, ribs or hips. Extremities shows no clubbing, cyanosis or edema. Neurological exam shows no focal neurological deficits. Skin exam no rashes, ecchymosis or petechia.  Lab Results  Component Value Date   WBC 6.5 01/05/2014   HGB 15.4 01/05/2014   HCT 45.9 01/05/2014   MCV 92 01/05/2014   PLT 226 01/05/2014     Chemistry      Component Value Date/Time   NA 139 01/05/2014 1210   NA 133* 11/06/2013 1126   NA 140 09/26/2013 1106   K 4.0 01/05/2014 1210   K 4.8 11/06/2013 1126   K 4.0 09/26/2013 1106   CL 96*  01/05/2014 1210   CL 98 11/06/2013 1126   CO2 28 01/05/2014 1210   CO2 28 11/06/2013 1126   CO2 27 09/26/2013 1106   BUN 12 01/05/2014 1210   BUN 12 11/06/2013 1126   BUN 17.2 09/26/2013 1106   CREATININE 0.8 01/05/2014 1210   CREATININE 0.75 11/06/2013 1126   CREATININE 0.8 09/26/2013 1106      Component Value Date/Time   CALCIUM 9.2 01/05/2014 1210   CALCIUM 9.3 11/06/2013 1126   CALCIUM 9.6 09/26/2013 1106   ALKPHOS 78 01/05/2014 1210   ALKPHOS 93 11/06/2013 1126   ALKPHOS 140 09/26/2013 1106   AST 23 01/05/2014 1210   AST 24 11/06/2013 1126   AST 24 09/26/2013 1106   ALT 17 01/05/2014 1210   ALT 17 11/06/2013 1126   ALT 12 09/26/2013 1106   BILITOT 0.70 01/05/2014 1210   BILITOT 0.6 11/06/2013 1126   BILITOT 1.35* 09/26/2013 1106         Impression and Plan: Ms. Knowles is 63 year old female. She is polycythemia. She's done very well with this. She's had no complications from this, to date.  She does need to be phlebotomized .  It has been quite a while since she has had a phlebotomy. I think with her hepatitis C being treated, I will I think that the hemoglobin will improve.  I will plan to get her back to see [place] in another couple of months.   [name], MD 11/16/20151:15 PM

## 2014-01-05 NOTE — Telephone Encounter (Signed)
Called to see if pt wanted to contact [facility] to schedule phlebotomy herself because she was requesting Rosemarie Beath. She wanted me to do it. I left message with Sharyn Lull pt wants 10-11 am not this Thursday and to call with appoinment

## 2014-01-06 LAB — FERRITIN CHCC: Ferritin: 17 ng/ml (ref 9–269)

## 2014-01-06 LAB — IRON AND TIBC CHCC
%SAT: 16 % — AB (ref 21–57)
Iron: 68 ug/dL (ref 41–142)
TIBC: 420 ug/dL (ref 236–444)
UIBC: 352 ug/dL (ref 120–384)

## 2014-01-13 ENCOUNTER — Ambulatory Visit (HOSPITAL_BASED_OUTPATIENT_CLINIC_OR_DEPARTMENT_OTHER): Payer: 59

## 2014-01-13 ENCOUNTER — Other Ambulatory Visit: Payer: Self-pay | Admitting: *Deleted

## 2014-01-13 VITALS — BP 129/65 | HR 57 | Temp 98.1°F | Resp 18

## 2014-01-13 DIAGNOSIS — E42 Marasmic kwashiorkor: Secondary | ICD-10-CM

## 2014-01-13 DIAGNOSIS — D751 Secondary polycythemia: Secondary | ICD-10-CM

## 2014-01-13 DIAGNOSIS — Z87898 Personal history of other specified conditions: Secondary | ICD-10-CM

## 2014-01-13 MED ORDER — SODIUM CHLORIDE 0.9 % IV SOLN
Freq: Once | INTRAVENOUS | Status: AC
  Administered 2014-01-13: 13:00:00 via INTRAVENOUS

## 2014-01-13 MED ORDER — SODIUM CHLORIDE 0.9 % IV SOLN
1000.0000 mL | INTRAVENOUS | Status: AC
  Administered 2014-01-13: 500 mL via INTRAVENOUS

## 2014-01-13 NOTE — Progress Notes (Signed)
Much improved post. Completed IVF. Stable on d/c today. HL

## 2014-01-13 NOTE — Progress Notes (Signed)
Pt. Did not eat breakfast, drink, cheese and cracker given. Dr. Marin Olp called and order for therapeutic phlebotomy and 554ml saline post procedure obtained for pt. today. Pt. has history of fainting during and post procedure.   At  1222 hr,  18g x 1.16 inch Angio cath and empty 547ml IV bag method used to perform therapeutic phlebotomy.  IV access established without issues via rt. Lateral antecubital site.  At 1230hr, pt. became "lightheaded", pt. placed back in recliner, v/s taken BP 66/29 P 48. Pt. denied nausea, chest pain or sob. IVF started infused at 952ml/hr.  Dr. Marin Olp called and order received to infuse total of 1 liter of normal saline today. V/s as followed: 1235hr BP 55/35 P55, 1240hr BP 81/47 P 54  O2 sat 98% Pt. stated that she is feeling better, BP 123/51 P 55. O2 sat at 97%. Pt. sat up slightly in recliner.  At 1250hr, pt. stated that she is feeling mild "pressure to chest". Denied pain or pressure radiating elsewhere.   IVF slowed to 123ml.hr. At this time, pt. has received approximately 34ml.  V/s 135 60 P 50.  Pressure subsided within a couple of minutes.  Talkative and in good spirit. Eating crackers and drinking.  Second 500 ml saline bag up at 1310hr.  Informed pt. that she will need to stay to recover and complete IVF. Pt. is scheduled to work this afternoon.  Encouraged pt. to call her work to notify them of above. At 1313hr, BP 118/49,  P 52.  Feeling much better, not as weak.  IVF infusion at 335ml/hr.  Total of 434g phlebotomized today. Resting now at 13:22hr. HL

## 2014-01-13 NOTE — Patient Instructions (Signed)

## 2014-01-23 ENCOUNTER — Ambulatory Visit: Payer: 59 | Admitting: Interventional Cardiology

## 2014-01-29 ENCOUNTER — Encounter: Payer: Self-pay | Admitting: Interventional Cardiology

## 2014-02-21 ENCOUNTER — Emergency Department (INDEPENDENT_AMBULATORY_CARE_PROVIDER_SITE_OTHER)
Admission: EM | Admit: 2014-02-21 | Discharge: 2014-02-21 | Disposition: A | Discharge status: Home / Self Care | Payer: Self-pay | Source: Home / Self Care | Attending: Family Medicine | Admitting: Family Medicine

## 2014-02-21 ENCOUNTER — Ambulatory Visit (HOSPITAL_COMMUNITY): Payer: 59 | Attending: Family Medicine

## 2014-02-21 ENCOUNTER — Encounter (HOSPITAL_COMMUNITY): Payer: Self-pay | Admitting: Emergency Medicine

## 2014-02-21 DIAGNOSIS — R509 Fever, unspecified: Secondary | ICD-10-CM | POA: Insufficient documentation

## 2014-02-21 DIAGNOSIS — R059 Cough, unspecified: Secondary | ICD-10-CM

## 2014-02-21 DIAGNOSIS — R05 Cough: Secondary | ICD-10-CM | POA: Diagnosis not present

## 2014-02-21 DIAGNOSIS — J441 Chronic obstructive pulmonary disease with (acute) exacerbation: Secondary | ICD-10-CM

## 2014-02-21 MED ORDER — IPRATROPIUM BROMIDE 0.02 % IN SOLN
0.5000 mg | Freq: Once | RESPIRATORY_TRACT | Status: AC
  Administered 2014-02-21: 0.5 mg via RESPIRATORY_TRACT

## 2014-02-21 MED ORDER — AZITHROMYCIN 250 MG PO TABS: 250.0000 mg | ORAL_TABLET | Freq: Every day | ORAL | Status: DC

## 2014-02-21 MED ORDER — HYDROCODONE-ACETAMINOPHEN 5-325 MG PO TABS: 0.5000 | ORAL_TABLET | Freq: Every evening | ORAL | Status: DC | PRN

## 2014-02-21 MED ORDER — PREDNISONE 50 MG PO TABS
50.0000 mg | ORAL_TABLET | Freq: Every day | ORAL | Status: DC
Start: 2014-02-21 — End: 2014-06-17

## 2014-02-21 MED ORDER — ALBUTEROL SULFATE HFA 108 (90 BASE) MCG/ACT IN AERS: 2.0000 | INHALATION_SPRAY | Freq: Four times a day (QID) | RESPIRATORY_TRACT | Status: DC | PRN

## 2014-02-21 MED ORDER — ALBUTEROL SULFATE (5 MG/ML) 0.5% IN NEBU
5.0000 mg | INHALATION_SOLUTION | Freq: Once | RESPIRATORY_TRACT | Status: AC
  Administered 2014-02-21: 5 mg via RESPIRATORY_TRACT

## 2014-02-21 MED ORDER — IPRATROPIUM BROMIDE 0.02 % IN SOLN
RESPIRATORY_TRACT | Status: AC
  Filled 2014-02-21: qty 2.5

## 2014-02-21 MED ORDER — ALBUTEROL SULFATE (2.5 MG/3ML) 0.083% IN NEBU
INHALATION_SOLUTION | RESPIRATORY_TRACT | Status: AC
  Filled 2014-02-21: qty 6

## 2014-02-21 NOTE — ED Provider Notes (Signed)
[name] is a 64 y.o. female who presents to Urgent Care today for shortness of breath cough and wheezing. Symptoms present for about a week. Cough is productive. No vomiting or diarrhea. Patient has had COPD-like exacerbations in the past and been prescribed home oxygen and nebulizers before. She's been using a home nebulizer with cold medications which seems to help some.   Past Medical History  Diagnosis Date  . Hepatitis C   . Hypertension   . Polycythemia 2009  . CAD (coronary artery disease)   . Arthritis   . GERD (gastroesophageal reflux disease)    Past Surgical History  Procedure Laterality Date  . 2 stints  Aug 26,2010    Dr. Tawnya Crook  . Cartoid endartherectomy  2007  . Coronary angioplasty with stent placement  10/09/2008   History  Substance Use Topics  . Smoking status: Current Every Day Smoker -- 1.00 packs/day for 45 years    Types: Cigarettes    Start date: 03/20/1968  . Smokeless tobacco: Never Used     Comment: 01-05-14   trying to cut back  . Alcohol Use: No     Comment: occassionally   ROS as above Medications: No current facility-administered medications for this encounter.   Current Outpatient Prescriptions  Medication Sig Dispense Refill  . ALPRAZolam (XANAX) 0.5 MG tablet Take 0.5 mg by mouth at bedtime.     . clopidogrel (PLAVIX) 75 MG tablet Take 75 mg by mouth daily. 09-22-13  ON HOLD    . escitalopram (LEXAPRO) 10 MG tablet Take 5 mg by mouth daily.     . hydrochlorothiazide (MICROZIDE) 12.5 MG capsule TAKE 1 CAPSULE (12.5 MG TOTAL) BY MOUTH DAILY. 30 capsule 10  . lansoprazole (PREVACID) 30 MG capsule Take 30 mg by mouth daily as needed. Ulcer/ acid reflux    . metoprolol succinate (TOPROL-XL) 100 MG 24 hr tablet TAKE 1 & 1/2 TABLETS EVERY DAY 45 tablet 10  . albuterol (PROVENTIL HFA;VENTOLIN HFA) 108 (90 BASE) MCG/ACT inhaler Inhale 2 puffs into the lungs every 6 (six) hours as needed for wheezing or shortness of breath. 1 Inhaler 2  .  azithromycin (ZITHROMAX) 250 MG tablet Take 1 tablet (250 mg total) by mouth daily. Take first 2 tablets together, then 1 every day until finished. 6 tablet 0  . clotrimazole-betamethasone (LOTRISONE) cream as needed    . Cyanocobalamin (VITAMIN B 12 PO) Take by mouth every morning.    Marland Kitchen HYDROcodone-acetaminophen (NORCO/VICODIN) 5-325 MG per tablet Take 0.5 tablets by mouth at bedtime as needed (cough). 10 tablet 0  . Multiple Vitamin (MULTIVITAMIN WITH MINERALS) TABS Take 1 tablet by mouth daily.    . nitroGLYCERIN (NITROSTAT) 0.4 MG SL tablet Place 1 tablet (0.4 mg total) under the tongue every 5 (five) minutes as needed. 25 tablet 3  . predniSONE (DELTASONE) 50 MG tablet Take 1 tablet (50 mg total) by mouth daily. 5 tablet 0  . vitamin C (ASCORBIC ACID) 500 MG tablet Take 500 mg by mouth daily.    . vitamin E 400 UNIT capsule Take 400 Units by mouth daily.     Allergies  Allergen Reactions  . Bactrim [Sulfamethoxazole-Trimethoprim]   . Diphenhydramine Hcl Hives     Exam:  BP 140/77 mmHg  Pulse 63  Temp(Src) 98.1 F (36.7 C) (Oral)  Resp 20  SpO2 93% Gen: Well NAD nontoxic appearing HEENT: EOMI,  MMM Lungs: Normal work of breathing. Poor air movement wheezing present bilaterally Heart: RRR no MRG Abd:  NABS, Soft. Nondistended, Nontender Exts: Brisk capillary refill, warm and well perfused.   Patient was given 5 mg albuterol and 0.5 milligrams Atrovent nebulized, and felt a little better  No results found for this or any previous visit (from the past 24 hour(s)). Dg Chest 2 View  02/21/2014   CLINICAL DATA:  Fever, cough.  EXAM: CHEST  2 VIEW  COMPARISON:  March 02, 2012.  FINDINGS: The heart size and mediastinal contours are within normal limits. Both lungs are clear. No pneumothorax or pleural effusion is noted. The visualized skeletal structures are unremarkable.  IMPRESSION: No acute cardiopulmonary abnormality seen.   Electronically Signed   By: Sabino Dick M.D.   On:  02/21/2014 19:31    Assessment and Plan: 64 y.o. female with COPD exacerbation. Treatment with prednisone and azithromycin albuterol and hydrocodone cough medication  Discussed warning signs or symptoms. Please see discharge instructions. Patient expresses understanding.     Gregor Hams, MD 02/21/14 [phone]

## 2014-02-21 NOTE — ED Notes (Signed)
C/o cold sx onset 7 days Sx include productive cough, chest congestion, fevers Smokes 1 ppd Taking Delsym OTC and using her nebulizer at home Alert, no signs of acute distress.

## 2014-02-21 NOTE — Discharge Instructions (Signed)
Thank you for coming in today. Call or go to the emergency room if you get worse, have trouble breathing, have chest pains, or palpitations.  Take medications as directed. Use albuterol as needed. Do not drive after taking hydrocodone cough medication. Quit smoking  Chronic Obstructive Pulmonary Disease Exacerbation Chronic obstructive pulmonary disease (COPD) is a common lung condition in which airflow from the lungs is limited. COPD is a general term that can be used to describe many different lung problems that limit airflow, including chronic bronchitis and emphysema. COPD exacerbations are episodes when breathing symptoms become much worse and require extra treatment. Without treatment, COPD exacerbations can be life threatening, and frequent COPD exacerbations can cause further damage to your lungs. CAUSES   Respiratory infections.   Exposure to smoke.   Exposure to air pollution, chemical fumes, or dust. Sometimes there is no apparent cause or trigger. RISK FACTORS  Smoking cigarettes.  Older age.  Frequent prior COPD exacerbations. SIGNS AND SYMPTOMS   Increased coughing.   Increased thick spit (sputum) production.   Increased wheezing.   Increased shortness of breath.   Rapid breathing.   Chest tightness. DIAGNOSIS  Your medical history, a physical exam, and tests will help your health care provider make a diagnosis. Tests may include:  A chest X-ray.  Basic lab tests.  Sputum testing.  An arterial blood gas test. TREATMENT  Depending on the severity of your COPD exacerbation, you may need to be admitted to a hospital for treatment. Some of the treatments commonly used to treat COPD exacerbations are:   Antibiotic medicines.   Bronchodilators. These are drugs that expand the air passages. They may be given with an inhaler or nebulizer. Spacer devices may be needed to help improve drug delivery.  Corticosteroid medicines.  Supplemental oxygen  therapy.  HOME CARE INSTRUCTIONS   Do not smoke. Quitting smoking is very important to prevent COPD from getting worse and exacerbations from happening as often.  Avoid exposure to all substances that irritate the airway, especially to tobacco smoke.   If you were prescribed an antibiotic medicine, finish it all even if you start to feel better.  Take all medicines as directed by your health care provider.It is important to use correct technique with inhaled medicines.  Drink enough fluids to keep your urine clear or pale yellow (unless you have a medical condition that requires fluid restriction).  Use a cool mist vaporizer. This makes it easier to clear your chest when you cough.   If you have a home nebulizer and oxygen, continue to use them as directed.   Maintain all necessary vaccinations to prevent infections.   Exercise regularly.   Eat a healthy diet.   Keep all follow-up appointments as directed by your health care provider. SEEK IMMEDIATE MEDICAL CARE IF:  You have worsening shortness of breath.   You have trouble talking.   You have severe chest pain.  You have blood in your sputum.  You have a fever.  You have weakness, vomit repeatedly, or faint.   You feel confused.   You continue to get worse. MAKE SURE YOU:   Understand these instructions.  Will watch your condition.  Will get help right away if you are not doing well or get worse. Document Released: 12/04/2006 Document Revised: 06/23/2013 Document Reviewed: 10/11/2012 [facility] Patient Information 2015 [place], [place]. This information is not intended to replace advice given to you by your health care provider. Make sure you discuss any questions you have  with your health care provider. ° °

## 2014-03-19 ENCOUNTER — Other Ambulatory Visit: Payer: Self-pay | Admitting: *Deleted

## 2014-03-19 DIAGNOSIS — D751 Secondary polycythemia: Secondary | ICD-10-CM

## 2014-03-20 ENCOUNTER — Ambulatory Visit: Payer: 59 | Admitting: Hematology & Oncology

## 2014-03-20 ENCOUNTER — Other Ambulatory Visit: Payer: 59 | Admitting: Lab

## 2014-03-20 ENCOUNTER — Telehealth: Payer: Self-pay | Admitting: Hematology & Oncology

## 2014-03-20 NOTE — Telephone Encounter (Signed)
Patient called and cx 03/20/14 apt and resch for 04/01/14.  [name] was notified

## 2014-04-01 ENCOUNTER — Ambulatory Visit: Payer: 59 | Admitting: Hematology & Oncology

## 2014-04-01 ENCOUNTER — Other Ambulatory Visit: Payer: 59 | Admitting: Lab

## 2014-04-15 ENCOUNTER — Encounter: Payer: Self-pay | Admitting: Hematology & Oncology

## 2014-04-15 ENCOUNTER — Ambulatory Visit (HOSPITAL_BASED_OUTPATIENT_CLINIC_OR_DEPARTMENT_OTHER): Payer: 59 | Admitting: Hematology & Oncology

## 2014-04-15 ENCOUNTER — Other Ambulatory Visit (HOSPITAL_BASED_OUTPATIENT_CLINIC_OR_DEPARTMENT_OTHER): Payer: 59 | Admitting: Lab

## 2014-04-15 VITALS — BP 142/69 | HR 66 | Temp 98.2°F | Resp 14 | Ht 60.0 in | Wt 138.0 lb

## 2014-04-15 DIAGNOSIS — D751 Secondary polycythemia: Secondary | ICD-10-CM

## 2014-04-15 DIAGNOSIS — B1710 Acute hepatitis C without hepatic coma: Secondary | ICD-10-CM

## 2014-04-15 DIAGNOSIS — D45 Polycythemia vera: Secondary | ICD-10-CM

## 2014-04-15 LAB — CBC WITH DIFFERENTIAL (CANCER CENTER ONLY)
BASO#: 0 10*3/uL (ref 0.0–0.2)
BASO%: 0.4 % (ref 0.0–2.0)
EOS%: 0.9 % (ref 0.0–7.0)
Eosinophils Absolute: 0.1 10*3/uL (ref 0.0–0.5)
HEMATOCRIT: 45.7 % (ref 34.8–46.6)
HGB: 15.1 g/dL (ref 11.6–15.9)
LYMPH#: 2.5 10*3/uL (ref 0.9–3.3)
LYMPH%: 32.8 % (ref 14.0–48.0)
MCH: 28.3 pg (ref 26.0–34.0)
MCHC: 33 g/dL (ref 32.0–36.0)
MCV: 86 fL (ref 81–101)
MONO#: 0.7 10*3/uL (ref 0.1–0.9)
MONO%: 9.1 % (ref 0.0–13.0)
NEUT#: 4.4 10*3/uL (ref 1.5–6.5)
NEUT%: 56.8 % (ref 39.6–80.0)
Platelets: 314 10*3/uL (ref 145–400)
RBC: 5.34 10*6/uL — ABNORMAL HIGH (ref 3.70–5.32)
RDW: 13.1 % (ref 11.1–15.7)
WBC: 7.7 10*3/uL (ref 3.9–10.0)

## 2014-04-15 LAB — COMPREHENSIVE METABOLIC PANEL
ALK PHOS: 100 U/L (ref 39–117)
ALT: 11 U/L (ref 0–35)
AST: 22 U/L (ref 0–37)
Albumin: 4 g/dL (ref 3.5–5.2)
BILIRUBIN TOTAL: 0.8 mg/dL (ref 0.2–1.2)
BUN: 10 mg/dL (ref 6–23)
CO2: 24 mEq/L (ref 19–32)
CREATININE: 0.88 mg/dL (ref 0.50–1.10)
Calcium: 9.3 mg/dL (ref 8.4–10.5)
Chloride: 96 mEq/L (ref 96–112)
Glucose, Bld: 94 mg/dL (ref 70–99)
Potassium: 3.8 mEq/L (ref 3.5–5.3)
Sodium: 134 mEq/L — ABNORMAL LOW (ref 135–145)
TOTAL PROTEIN: 7.1 g/dL (ref 6.0–8.3)

## 2014-04-15 NOTE — Progress Notes (Signed)
Hematology and Oncology Follow Up Visit  [name] [id number] 17-Jan-1951 64 y.o. 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 is back for followup. She feels a little bit tired. She was last phlebotomized of the back in December 2015.   She is still working. She work is becoming more difficult for her.  She had a chest x-ray in January. There is some concern about her having pneumonia.  She is still smoking. She says that she will stop smoking in March.  She's had no change in bowel or bladder habits. She's had no fever. She's had no nausea or vomiting.  There's been no rashes. She's had no leg swelling. She has had some weight loss. She's trying to watch what she eats a little bit better.  She's tried to exercise a little bit more.    Her last ferritin back in November was 17 with an iron saturation 16 %.  Medications:  Current outpatient prescriptions:  .  albuterol (PROVENTIL HFA;VENTOLIN HFA) 108 (90 BASE) MCG/ACT inhaler, Inhale 2 puffs into the lungs every 6 (six) hours as needed for wheezing or shortness of breath., Disp: 1 Inhaler, Rfl: 2 .  ALPRAZolam (XANAX) 0.5 MG tablet, Take 0.5 mg by mouth at bedtime. , Disp: , Rfl:  .  clopidogrel (PLAVIX) 75 MG tablet, Take 75 mg by mouth daily. 09-22-13  ON HOLD, Disp: , Rfl:  .  clotrimazole-betamethasone (LOTRISONE) cream, as needed, Disp: , Rfl:  .  Cyanocobalamin (VITAMIN B 12 PO), Take by mouth every morning., Disp: , Rfl:  .  escitalopram (LEXAPRO) 10 MG tablet, Take 5 mg by mouth daily. , Disp: , Rfl:  .  hydrochlorothiazide (MICROZIDE) 12.5 MG capsule, TAKE 1 CAPSULE (12.5 MG TOTAL) BY MOUTH DAILY., Disp: 30 capsule, Rfl: 10 .  lansoprazole (PREVACID) 30 MG capsule, Take 30 mg by mouth daily as needed. Ulcer/  acid reflux, Disp: , Rfl:  .  metoprolol succinate (TOPROL-XL) 100 MG 24 hr tablet, TAKE 1 & 1/2 TABLETS EVERY DAY, Disp: 45 tablet, Rfl: 10 .  Multiple Vitamin (MULTIVITAMIN WITH MINERALS) TABS, Take 1 tablet by mouth daily., Disp: , Rfl:  .  nitroGLYCERIN (NITROSTAT) 0.4 MG SL tablet, Place 1 tablet (0.4 mg total) under the tongue every 5 (five) minutes as needed., Disp: 25 tablet, Rfl: 3 .  predniSONE (DELTASONE) 50 MG tablet, Take 1 tablet (50 mg total) by mouth daily., Disp: 5 tablet, Rfl: 0 .  vitamin C (ASCORBIC ACID) 500 MG tablet, Take 500 mg by mouth daily., Disp: , Rfl:  .  vitamin E 400 UNIT capsule, Take 400 Units by mouth daily., Disp: , Rfl:   Allergies:  Allergies  Allergen Reactions  . Bactrim [Sulfamethoxazole-Trimethoprim]   . Diphenhydramine Hcl Hives    Past Medical History, Surgical history, Social history, and Family History were reviewed and updated.  Review of Systems: As above  Physical Exam:  height is 5' (1.524 m) and weight is 138 lb (62.596 kg). Her oral temperature is 98.2 F (36.8 C). Her blood pressure is 142/69 and her pulse is 66. Her respiration is 14.   Well-developed well-nourished white female. Head and exam shows no ocular or oral lesions. She has no scleral icterus.  There is no adenopathy in the neck. Lungs are clear. Cardiac exam regular rate and rhythm with no murmurs, rubs or bruits. Abdomen soft. Has good bowel sounds. There is no fluid wave. There is no palpable liver or spleen tip. There is a palpable abdominal mass. Back exam no tenderness over the spine, ribs or hips. Extremities shows no clubbing, cyanosis or edema. Neurological exam shows no focal neurological deficits. Skin exam no rashes, ecchymosis or petechia.  Lab Results  Component Value Date   WBC 7.7 04/15/2014   HGB 15.1 04/15/2014   HCT 45.7 04/15/2014   MCV 86 04/15/2014   PLT 314 04/15/2014     Chemistry      Component Value Date/Time   NA 139 01/05/2014 1210   NA  133* 11/06/2013 1126   NA 140 09/26/2013 1106   K 4.0 01/05/2014 1210   K 4.8 11/06/2013 1126   K 4.0 09/26/2013 1106   CL 96* 01/05/2014 1210   CL 98 11/06/2013 1126   CO2 28 01/05/2014 1210   CO2 28 11/06/2013 1126   CO2 27 09/26/2013 1106   BUN 12 01/05/2014 1210   BUN 12 11/06/2013 1126   BUN 17.2 09/26/2013 1106   CREATININE 0.8 01/05/2014 1210   CREATININE 0.75 11/06/2013 1126   CREATININE 0.8 09/26/2013 1106      Component Value Date/Time   CALCIUM 9.2 01/05/2014 1210   CALCIUM 9.3 11/06/2013 1126   CALCIUM 9.6 09/26/2013 1106   ALKPHOS 78 01/05/2014 1210   ALKPHOS 93 11/06/2013 1126   ALKPHOS 140 09/26/2013 1106   AST 23 01/05/2014 1210   AST 24 11/06/2013 1126   AST 24 09/26/2013 1106   ALT 17 01/05/2014 1210   ALT 17 11/06/2013 1126   ALT 12 09/26/2013 1106   BILITOT 0.70 01/05/2014 1210   BILITOT 0.6 11/06/2013 1126   BILITOT 1.35* 09/26/2013 1106         Impression and Plan: [name] is 64 year old female. She is polycythemia. She's done very well with this. She's had no complications from this, to date.  She does need to be phlebotomized . She has problems with phlebotomies as she gets vasovagal. We will get this done at the main [facility]. She likes one of the nurses there to do it.  I will plan to get her back to see [place] in another couple of months.   [name], MD 2/24/20166:25 PM

## 2014-04-16 ENCOUNTER — Telehealth: Payer: Self-pay | Admitting: Hematology & Oncology

## 2014-04-16 LAB — IRON AND TIBC CHCC
%SAT: 17 % — ABNORMAL LOW (ref 21–57)
IRON: 79 ug/dL (ref 41–142)
TIBC: 473 ug/dL — AB (ref 236–444)
UIBC: 394 ug/dL — ABNORMAL HIGH (ref 120–384)

## 2014-04-16 LAB — FERRITIN CHCC: FERRITIN: 15 ng/mL (ref 9–269)

## 2014-04-16 NOTE — Telephone Encounter (Signed)
Pt aware of 3-3 phlebotomy and 4-27 MD

## 2014-04-20 ENCOUNTER — Other Ambulatory Visit: Payer: Self-pay | Admitting: Interventional Cardiology

## 2014-04-20 ENCOUNTER — Other Ambulatory Visit: Payer: Self-pay | Admitting: Nurse Practitioner

## 2014-04-20 DIAGNOSIS — D751 Secondary polycythemia: Secondary | ICD-10-CM

## 2014-04-21 NOTE — Telephone Encounter (Signed)
Was the patient to resume this medication? Her last ov says that it is on hold 09/22/13. Please advise. Thanks, MI

## 2014-04-23 ENCOUNTER — Ambulatory Visit (HOSPITAL_BASED_OUTPATIENT_CLINIC_OR_DEPARTMENT_OTHER): Payer: 59

## 2014-04-23 ENCOUNTER — Other Ambulatory Visit: Payer: Self-pay | Admitting: *Deleted

## 2014-04-23 VITALS — BP 122/58 | HR 73 | Temp 96.8°F

## 2014-04-23 DIAGNOSIS — D751 Secondary polycythemia: Secondary | ICD-10-CM

## 2014-04-23 MED ORDER — SODIUM CHLORIDE 0.9 % IV SOLN: 1000.0000 mL | Freq: Once | INTRAVENOUS | Status: DC

## 2014-04-23 NOTE — Progress Notes (Signed)
Pt. Arrived for therapeutic phlebotomy. Eaten at home. Pt. C/o'ed of fatigue and soreness "all over" due to moving in with mom.  20g angio catheter, empty IV bag and secondary IV bag method used. Pt. Tolerated procedure well. Pt. Did c/o'ed of mild "my heart feels like it is beating fast". Manual pulse checked with steady beat.  500g phlebotomized via rt. Proximal lateral antecubital site without issues.  Pt. Did not wish to have IVF today.  Hx of "fainting" with phlebotomy, Dr. Marin Olp entered IVF.  Spoke to Charlsie Merles RN at Dr. Antonieta Pert office and aware that pt. Will not be given IVF today.   Jesse Fall RN will take over caring of Ms. Scarboro after today in [place] for future procedure. HL

## 2014-04-23 NOTE — Progress Notes (Signed)
1525-VS stable.  Pt is without complaints. Snack and drink taken after phlebotomy.  NSL d/c'd and pressure dressing applied.  Phlebotomy discharge instructions reviewed with pt and she has no questions at this time.

## 2014-04-27 NOTE — Telephone Encounter (Signed)
Medication ON HOLD per pt oncologist

## 2014-05-15 ENCOUNTER — Other Ambulatory Visit: Payer: Self-pay | Admitting: Interventional Cardiology

## 2014-06-11 ENCOUNTER — Other Ambulatory Visit: Payer: Self-pay | Admitting: Internal Medicine

## 2014-06-11 ENCOUNTER — Ambulatory Visit
Admission: RE | Admit: 2014-06-11 | Discharge: 2014-06-11 | Disposition: A | Discharge status: Home / Self Care | Payer: 59 | Source: Ambulatory Visit | Attending: Internal Medicine | Admitting: Internal Medicine

## 2014-06-11 DIAGNOSIS — J449 Chronic obstructive pulmonary disease, unspecified: Secondary | ICD-10-CM

## 2014-06-17 ENCOUNTER — Ambulatory Visit (HOSPITAL_BASED_OUTPATIENT_CLINIC_OR_DEPARTMENT_OTHER): Payer: 59 | Admitting: Hematology & Oncology

## 2014-06-17 ENCOUNTER — Other Ambulatory Visit: Payer: 59 | Admitting: Lab

## 2014-06-17 ENCOUNTER — Encounter: Payer: Self-pay | Admitting: Hematology & Oncology

## 2014-06-17 ENCOUNTER — Ambulatory Visit: Payer: 59 | Admitting: Hematology & Oncology

## 2014-06-17 ENCOUNTER — Other Ambulatory Visit (HOSPITAL_BASED_OUTPATIENT_CLINIC_OR_DEPARTMENT_OTHER): Payer: 59

## 2014-06-17 VITALS — BP 133/71 | HR 59 | Temp 97.8°F | Resp 14 | Ht 60.0 in | Wt 135.0 lb

## 2014-06-17 DIAGNOSIS — B1710 Acute hepatitis C without hepatic coma: Secondary | ICD-10-CM

## 2014-06-17 DIAGNOSIS — D751 Secondary polycythemia: Secondary | ICD-10-CM | POA: Diagnosis not present

## 2014-06-17 LAB — CBC WITH DIFFERENTIAL (CANCER CENTER ONLY)
BASO#: 0 10*3/uL (ref 0.0–0.2)
BASO%: 0.3 % (ref 0.0–2.0)
EOS%: 1.4 % (ref 0.0–7.0)
Eosinophils Absolute: 0.1 10*3/uL (ref 0.0–0.5)
HCT: 41.9 % (ref 34.8–46.6)
HGB: 13.5 g/dL (ref 11.6–15.9)
LYMPH#: 1.8 10*3/uL (ref 0.9–3.3)
LYMPH%: 25.6 % (ref 14.0–48.0)
MCH: 26.4 pg (ref 26.0–34.0)
MCHC: 32.2 g/dL (ref 32.0–36.0)
MCV: 82 fL (ref 81–101)
MONO#: 0.6 10*3/uL (ref 0.1–0.9)
MONO%: 8.7 % (ref 0.0–13.0)
NEUT#: 4.5 10*3/uL (ref 1.5–6.5)
NEUT%: 64 % (ref 39.6–80.0)
PLATELETS: 263 10*3/uL (ref 145–400)
RBC: 5.11 10*6/uL (ref 3.70–5.32)
RDW: 14 % (ref 11.1–15.7)
WBC: 7 10*3/uL (ref 3.9–10.0)

## 2014-06-17 LAB — CMP (CANCER CENTER ONLY)
ALBUMIN: 3.7 g/dL (ref 3.3–5.5)
ALT(SGPT): 18 U/L (ref 10–47)
AST: 30 U/L (ref 11–38)
Alkaline Phosphatase: 83 U/L (ref 26–84)
BILIRUBIN TOTAL: 0.7 mg/dL (ref 0.20–1.60)
BUN: 15 mg/dL (ref 7–22)
CO2: 29 meq/L (ref 18–33)
Calcium: 9.5 mg/dL (ref 8.0–10.3)
Chloride: 100 mEq/L (ref 98–108)
Creat: 1 mg/dl (ref 0.6–1.2)
GLUCOSE: 106 mg/dL (ref 73–118)
Potassium: 3.9 mEq/L (ref 3.3–4.7)
SODIUM: 140 meq/L (ref 128–145)
TOTAL PROTEIN: 7.2 g/dL (ref 6.4–8.1)

## 2014-06-17 LAB — FERRITIN CHCC: FERRITIN: 12 ng/mL (ref 9–269)

## 2014-06-17 LAB — TECHNOLOGIST REVIEW CHCC SATELLITE

## 2014-06-17 LAB — IRON AND TIBC CHCC
%SAT: 10 % — AB (ref 21–57)
Iron: 48 ug/dL (ref 41–142)
TIBC: 475 ug/dL — ABNORMAL HIGH (ref 236–444)
UIBC: 427 ug/dL — ABNORMAL HIGH (ref 120–384)

## 2014-06-17 NOTE — Progress Notes (Signed)
Hematology and Oncology Follow Up Visit  [name] [id number] [birth year] 64 y.o. 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 She feels a little bit tired. She was phlebotomized last time we saw her. If she has these episodes where she just feels like she is almost passing out. She said that she saw her doctor. She is on Plavix. She does have vascular disease. She said that her heart rate was 47. I much or below will be done about this. I think if she continues have symptoms, she may need to have a pacemaker inserted.  She is under more stress right now. She lives with her mother who needs a lot of medical care.  She is still working. She work is becoming more difficult for her.  She had a chest x-ray in January. There is some concern about her having pneumonia.  She is still smoking. She says that she will stop smoking in May on Mother's Day when her family comes to visit her.  She's had no change in bowel or bladder habits. She's had no fever. She's had no nausea or vomiting. She thinks her last colonoscopy was about 3-4 years ago.  She still worried about some weight loss. I might sure what this is coming from. Her weight is down 3 pounds today. She looks and feels pretty much asymptomatic from this.   Her last ferritin back in February was 15 with an iron saturation 17 %.  Medications:  Current outpatient prescriptions:  .  albuterol (PROVENTIL HFA;VENTOLIN HFA) 108 (90 BASE) MCG/ACT inhaler, Inhale 2 puffs into the lungs every 6 (six) hours as needed for wheezing or shortness of breath., Disp: 1 Inhaler, Rfl: 2 .  ALPRAZolam (XANAX) 0.5 MG tablet, Take 0.5 mg by mouth at bedtime. , Disp: , Rfl:  .  clopidogrel (PLAVIX) 75 MG tablet, TAKE 1  TABLET BY MOUTH ONCE DAILY, Disp: 30 tablet, Rfl: 5 .  clotrimazole-betamethasone (LOTRISONE) cream, as needed, Disp: , Rfl:  .  Cyanocobalamin (VITAMIN B 12 PO), Take by mouth every morning., Disp: , Rfl:  .  escitalopram (LEXAPRO) 10 MG tablet, Take 5 mg by mouth daily. , Disp: , Rfl:  .  hydrochlorothiazide (MICROZIDE) 12.5 MG capsule, TAKE 1 CAPSULE (12.5 MG TOTAL) BY MOUTH DAILY., Disp: 30 capsule, Rfl: 10 .  lansoprazole (PREVACID) 30 MG capsule, Take 30 mg by mouth daily as needed. Ulcer/ acid reflux, Disp: , Rfl:  .  metoprolol succinate (TOPROL-XL) 100 MG 24 hr tablet, TAKE 1 & 1/2 TABLETS EVERY DAY (Patient taking differently: dec. 4-27- Taking 1 tab daily), Disp: 45 tablet, Rfl: 10 .  Multiple Vitamin (MULTIVITAMIN WITH MINERALS) TABS, Take 1 tablet by mouth daily., Disp: , Rfl:  .  nitroGLYCERIN (NITROSTAT) 0.4 MG SL tablet, Place 1 tablet (0.4 mg total) under the tongue every 5 (five) minutes as needed., Disp: 25 tablet, Rfl: 3 .  vitamin C (ASCORBIC ACID) 500 MG tablet, Take 500 mg by mouth daily., Disp: , Rfl:  .  vitamin E 400 UNIT capsule, Take 400 Units by mouth daily., Disp: , Rfl:  .  simvastatin (ZOCOR) 10 MG tablet, Take 10 mg by mouth daily at 6 PM. ,  Disp: , Rfl:   Allergies:  Allergies  Allergen Reactions  . Bactrim [Sulfamethoxazole-Trimethoprim]   . Diphenhydramine Hcl Hives    Past Medical History, Surgical history, Social history, and Family History were reviewed and updated.  Review of Systems: As above  Physical Exam:  height is 5' (1.524 m) and weight is 135 lb (61.236 kg). Her oral temperature is 97.8 F (36.6 C). Her blood pressure is 133/71 and her pulse is 59. Her respiration is 14.   Well-developed well-nourished white female. Head and exam shows no ocular or oral lesions. She has no scleral icterus. There is no adenopathy in the neck. Lungs are clear. Cardiac exam regular rate and rhythm with no murmurs, rubs or bruits. Abdomen soft. Has good bowel  sounds. There is no fluid wave. There is no palpable liver or spleen tip. There is a palpable abdominal mass. Back exam no tenderness over the spine, ribs or hips. Extremities shows no clubbing, cyanosis or edema. Neurological exam shows no focal neurological deficits. Skin exam no rashes, ecchymosis or petechia.  Lab Results  Component Value Date   WBC 7.0 06/17/2014   HGB 13.5 06/17/2014   HCT 41.9 06/17/2014   MCV 82 06/17/2014   PLT 263 06/17/2014     Chemistry      Component Value Date/Time   NA 140 06/17/2014 1036   NA 134* 04/15/2014 1527   NA 140 09/26/2013 1106   K 3.9 06/17/2014 1036   K 3.8 04/15/2014 1527   K 4.0 09/26/2013 1106   CL 100 06/17/2014 1036   CL 96 04/15/2014 1527   CO2 29 06/17/2014 1036   CO2 24 04/15/2014 1527   CO2 27 09/26/2013 1106   BUN 15 06/17/2014 1036   BUN 10 04/15/2014 1527   BUN 17.2 09/26/2013 1106   CREATININE 1.0 06/17/2014 1036   CREATININE 0.88 04/15/2014 1527   CREATININE 0.8 09/26/2013 1106      Component Value Date/Time   CALCIUM 9.5 06/17/2014 1036   CALCIUM 9.3 04/15/2014 1527   CALCIUM 9.6 09/26/2013 1106   ALKPHOS 83 06/17/2014 1036   ALKPHOS 100 04/15/2014 1527   ALKPHOS 140 09/26/2013 1106   AST 30 06/17/2014 1036   AST 22 04/15/2014 1527   AST 24 09/26/2013 1106   ALT 18 06/17/2014 1036   ALT 11 04/15/2014 1527   ALT 12 09/26/2013 1106   BILITOT 0.70 06/17/2014 1036   BILITOT 0.8 04/15/2014 1527   BILITOT 1.35* 09/26/2013 1106         Impression and Plan: [name] is 64 year old female. She is polycythemia. She's done very well with this. She's had no complications from this, to date.  She does not need to be phlebotomized . She probably cannot go several months before a phlebotomy.  Her hepatitis C, I suspect, should be in remission. I think that this will be a big help to her. I will plan to get her back to see [place] in another couple of months.   [name], MD 4/27/201611:33 AM

## 2014-06-18 LAB — HEPATITIS C RNA QUANTITATIVE: HCV QUANT: NOT DETECTED [IU]/mL (ref <15)

## 2014-09-08 ENCOUNTER — Other Ambulatory Visit: Payer: Self-pay | Admitting: Interventional Cardiology

## 2014-09-08 DIAGNOSIS — I6523 Occlusion and stenosis of bilateral carotid arteries: Secondary | ICD-10-CM

## 2014-09-15 ENCOUNTER — Ambulatory Visit (HOSPITAL_COMMUNITY): Payer: 59 | Attending: Cardiovascular Disease

## 2014-09-15 DIAGNOSIS — I6523 Occlusion and stenosis of bilateral carotid arteries: Secondary | ICD-10-CM | POA: Diagnosis not present

## 2014-09-16 ENCOUNTER — Ambulatory Visit (HOSPITAL_BASED_OUTPATIENT_CLINIC_OR_DEPARTMENT_OTHER): Payer: 59 | Admitting: Hematology & Oncology

## 2014-09-16 ENCOUNTER — Telehealth: Payer: Self-pay

## 2014-09-16 ENCOUNTER — Encounter: Payer: Self-pay | Admitting: Hematology & Oncology

## 2014-09-16 ENCOUNTER — Other Ambulatory Visit (HOSPITAL_BASED_OUTPATIENT_CLINIC_OR_DEPARTMENT_OTHER): Payer: 59

## 2014-09-16 VITALS — BP 142/85 | HR 73 | Temp 98.3°F | Resp 16 | Ht 61.0 in | Wt 129.0 lb

## 2014-09-16 DIAGNOSIS — D751 Secondary polycythemia: Secondary | ICD-10-CM

## 2014-09-16 DIAGNOSIS — I6523 Occlusion and stenosis of bilateral carotid arteries: Secondary | ICD-10-CM

## 2014-09-16 DIAGNOSIS — D45 Polycythemia vera: Secondary | ICD-10-CM

## 2014-09-16 DIAGNOSIS — E785 Hyperlipidemia, unspecified: Secondary | ICD-10-CM

## 2014-09-16 DIAGNOSIS — R634 Abnormal weight loss: Secondary | ICD-10-CM

## 2014-09-16 DIAGNOSIS — R1030 Lower abdominal pain, unspecified: Secondary | ICD-10-CM

## 2014-09-16 DIAGNOSIS — B1710 Acute hepatitis C without hepatic coma: Secondary | ICD-10-CM

## 2014-09-16 DIAGNOSIS — R1084 Generalized abdominal pain: Secondary | ICD-10-CM

## 2014-09-16 LAB — COMPREHENSIVE METABOLIC PANEL (CC13)
ALK PHOS: 99 U/L (ref 40–150)
ALT: 10 U/L (ref 0–55)
AST: 20 U/L (ref 5–34)
Albumin: 3.6 g/dL (ref 3.5–5.0)
Anion Gap: 10 mEq/L (ref 3–11)
BUN: 14 mg/dL (ref 7.0–26.0)
CO2: 26 mEq/L (ref 22–29)
CREATININE: 0.8 mg/dL (ref 0.6–1.1)
Calcium: 9.2 mg/dL (ref 8.4–10.4)
Chloride: 102 mEq/L (ref 98–109)
EGFR: 76 mL/min/{1.73_m2} — ABNORMAL LOW (ref >90)
GLUCOSE: 124 mg/dL (ref 70–140)
Potassium: 3.9 mEq/L (ref 3.5–5.1)
Sodium: 138 mEq/L (ref 136–145)
Total Bilirubin: 0.4 mg/dL (ref 0.20–1.20)
Total Protein: 6.8 g/dL (ref 6.4–8.3)

## 2014-09-16 LAB — CBC WITH DIFFERENTIAL (CANCER CENTER ONLY)
BASO#: 0 10*3/uL (ref 0.0–0.2)
BASO%: 0.2 % (ref 0.0–2.0)
EOS ABS: 0.1 10*3/uL (ref 0.0–0.5)
EOS%: 1.1 % (ref 0.0–7.0)
HEMATOCRIT: 42.7 % (ref 34.8–46.6)
HEMOGLOBIN: 13.9 g/dL (ref 11.6–15.9)
LYMPH#: 1.6 10*3/uL (ref 0.9–3.3)
LYMPH%: 26 % (ref 14.0–48.0)
MCH: 26.3 pg (ref 26.0–34.0)
MCHC: 32.6 g/dL (ref 32.0–36.0)
MCV: 81 fL (ref 81–101)
MONO#: 0.6 10*3/uL (ref 0.1–0.9)
MONO%: 9.6 % (ref 0.0–13.0)
NEUT#: 3.9 10*3/uL (ref 1.5–6.5)
NEUT%: 63.1 % (ref 39.6–80.0)
PLATELETS: 261 10*3/uL (ref 145–400)
RBC: 5.29 10*6/uL (ref 3.70–5.32)
RDW: 15.7 % (ref 11.1–15.7)
WBC: 6.2 10*3/uL (ref 3.9–10.0)

## 2014-09-16 LAB — LIPID PANEL
CHOL/HDL RATIO: 5.1 ratio — AB (ref <=5.0)
CHOLESTEROL: 202 mg/dL — AB (ref 125–200)
HDL: 40 mg/dL — AB (ref >=46)
LDL Cholesterol: 125 mg/dL (ref <130)
Triglycerides: 184 mg/dL — ABNORMAL HIGH (ref <150)
VLDL: 37 mg/dL — ABNORMAL HIGH (ref <30)

## 2014-09-16 LAB — FERRITIN CHCC: FERRITIN: 13 ng/mL (ref 9–269)

## 2014-09-16 LAB — IRON AND TIBC CHCC
%SAT: 8 % — ABNORMAL LOW (ref 21–57)
Iron: 37 ug/dL — ABNORMAL LOW (ref 41–142)
TIBC: 454 ug/dL — ABNORMAL HIGH (ref 236–444)
UIBC: 417 ug/dL — AB (ref 120–384)

## 2014-09-16 NOTE — Telephone Encounter (Signed)
-----   Message from Belva Crome, MD sent at 09/16/2014 12:16 PM EDT ----- Stable and needs to be repeated in one year

## 2014-09-16 NOTE — Progress Notes (Signed)
Hematology and Oncology Follow Up Visit  [name] [id number] 06/17/1950 64 y.o. 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 is back for followup. She is still losing weight. She's lost 6 more pounds since we saw her 3 months ago. Paroxysmal smoke a pack a day. She does state a chronic cough. There's some occasional blood-tinged mucus with that she coughs up.  Her last CT scan of the chest was year ago. Her last chest x-ray was back in January. I really believe that she is going to need a CT scan to see what is going on. She is definitely at risk for malignancy.  She is also complaining of abdominal pain. She is not eating as much. She's having some diarrhea. There is some crampiness.  Again, I think that she would benefit from a CT scan of the abdomen to make sure that there is no underlying issues that we are not finding on our exam or with her lab work. Again, with the polycythemia and hepatitis C, she is at risk for other malignancies.  She now living with her mother. This is a big change for her. She is trying to manage.  Her appetite has been pretty good. She thinks that she's been eating okay.  She's had no problems with fever. She's had no sweats. She's had no rashes.  Overall, her performance status is ECOG 1.   Medications:  Current outpatient prescriptions:  .  albuterol (PROVENTIL HFA;VENTOLIN HFA) 108 (90 BASE) MCG/ACT inhaler, Inhale 2 puffs into the lungs every 6 (six) hours as needed for wheezing or shortness of breath., Disp: 1 Inhaler, Rfl: 2 .  ALPRAZolam (XANAX) 0.5 MG tablet, Take 0.5 mg by mouth at bedtime. , Disp: , Rfl:  .  clopidogrel (PLAVIX) 75 MG tablet, TAKE 1 TABLET BY MOUTH ONCE DAILY, Disp: 30 tablet, Rfl: 5 .  clotrimazole-betamethasone  (LOTRISONE) cream, as needed, Disp: , Rfl:  .  Cyanocobalamin (VITAMIN B 12 PO), Take by mouth every morning., Disp: , Rfl:  .  escitalopram (LEXAPRO) 10 MG tablet, Take 5 mg by mouth daily. , Disp: , Rfl:  .  hydrochlorothiazide (MICROZIDE) 12.5 MG capsule, TAKE 1 CAPSULE (12.5 MG TOTAL) BY MOUTH DAILY., Disp: 30 capsule, Rfl: 10 .  lansoprazole (PREVACID) 30 MG capsule, Take 30 mg by mouth daily as needed. Ulcer/ acid reflux, Disp: , Rfl:  .  metoprolol succinate (TOPROL-XL) 100 MG 24 hr tablet, TAKE 1 & 1/2 TABLETS EVERY DAY (Patient taking differently: dec. 4-27- Taking 1 tab daily), Disp: 45 tablet, Rfl: 10 .  Multiple Vitamin (MULTIVITAMIN WITH MINERALS) TABS, Take 1 tablet by mouth daily., Disp: , Rfl:  .  nitroGLYCERIN (NITROSTAT) 0.4 MG SL tablet, Place 1 tablet (0.4 mg total) under the tongue every 5 (five) minutes as needed., Disp: 25 tablet, Rfl: 3 .  simvastatin (ZOCOR) 10 MG tablet, Take 10 mg by mouth daily at 6 PM. , Disp: , Rfl:  .  vitamin C (ASCORBIC ACID) 500 MG tablet, Take 500 mg by mouth daily., Disp: , Rfl:  .  vitamin E 400 UNIT capsule, Take 400 Units by mouth daily., Disp: , Rfl:   Allergies:  Allergies  Allergen Reactions  . Bactrim [Sulfamethoxazole-Trimethoprim]   .  Diphenhydramine Hcl Hives    Past Medical History, Surgical history, Social history, and Family History were reviewed and updated.  Review of Systems: As above  Physical Exam:  height is 5\' 1"  (1.549 m) and weight is 129 lb (58.514 kg). Her oral temperature is 98.3 F (36.8 C). Her blood pressure is 142/85 and her pulse is 73. Her respiration is 16.   Well-developed well-nourished white female. Head and exam shows no ocular or oral lesions. She has no scleral icterus. There is no adenopathy in the neck. Lungs are with some crackles bilaterally. There might be some decreased breath sounds over on the right side. Cardiac exam regular rate and rhythm with no murmurs, rubs or bruits. Abdomen is soft.  She has good bowel sounds. There is no fluid wave. There is no palpable liver or spleen tip. There is a palpable abdominal mass. She is slightly tender in the left lower quadrant. Back exam no tenderness over the spine, ribs or hips. Extremities shows no clubbing, cyanosis or edema. Neurological exam shows no focal neurological deficits. Skin exam no rashes, ecchymosis or petechia.  Lab Results  Component Value Date   WBC 6.2 09/16/2014   HGB 13.9 09/16/2014   HCT 42.7 09/16/2014   MCV 81 09/16/2014   PLT 261 09/16/2014     Chemistry      Component Value Date/Time   NA 140 06/17/2014 1036   NA 134* 04/15/2014 1527   NA 140 09/26/2013 1106   K 3.9 06/17/2014 1036   K 3.8 04/15/2014 1527   K 4.0 09/26/2013 1106   CL 100 06/17/2014 1036   CL 96 04/15/2014 1527   CO2 29 06/17/2014 1036   CO2 24 04/15/2014 1527   CO2 27 09/26/2013 1106   BUN 15 06/17/2014 1036   BUN 10 04/15/2014 1527   BUN 17.2 09/26/2013 1106   CREATININE 1.0 06/17/2014 1036   CREATININE 0.88 04/15/2014 1527   CREATININE 0.8 09/26/2013 1106      Component Value Date/Time   CALCIUM 9.5 06/17/2014 1036   CALCIUM 9.3 04/15/2014 1527   CALCIUM 9.6 09/26/2013 1106   ALKPHOS 83 06/17/2014 1036   ALKPHOS 100 04/15/2014 1527   ALKPHOS 140 09/26/2013 1106   AST 30 06/17/2014 1036   AST 22 04/15/2014 1527   AST 24 09/26/2013 1106   ALT 18 06/17/2014 1036   ALT 11 04/15/2014 1527   ALT 12 09/26/2013 1106   BILITOT 0.70 06/17/2014 1036   BILITOT 0.8 04/15/2014 1527   BILITOT 1.35* 09/26/2013 1106         Impression and Plan: [name] is a 64 year old white female. She has polycythemia. She has treated hepatitis C.  Again, the weight loss is quite troublesome. I do think that the cough with occasional hemoptysis wants a CT scan.  The abdominal pain I think warrants a CT of the abdomen area and we will try to get these set up in the next couple weeks.  She does not need to be phlebotomized. I think with  the hepatitis C being treated, and also with her being iron deficient, phlebotomies will be very few for her.  I want to her back in a couple months. If we see something on the scans, then we will get her back sooner.   I spent about 30 minutes with her today.  [name], MD 7/27/20161:18 PM

## 2014-09-16 NOTE — Telephone Encounter (Signed)
Called to give pt carotid results. [email or web address]. 

## 2014-09-17 NOTE — Telephone Encounter (Signed)
-----   Message from Belva Crome, MD sent at 09/16/2014 12:16 PM EDT ----- Stable and needs to be repeated in one year

## 2014-09-17 NOTE — Telephone Encounter (Signed)
Follow up  ° ° ° °Pt is returning Lisa call  °

## 2014-09-17 NOTE — Telephone Encounter (Signed)
Pt aware of carotid results  Stable and needs to be repeated in one year Pt verbalized understanding.

## 2014-10-07 ENCOUNTER — Ambulatory Visit
Admission: RE | Admit: 2014-10-07 | Discharge: 2014-10-07 | Disposition: A | Discharge status: Home / Self Care | Payer: 59 | Source: Ambulatory Visit | Attending: Hematology & Oncology | Admitting: Hematology & Oncology

## 2014-10-07 ENCOUNTER — Other Ambulatory Visit: Payer: 59

## 2014-10-07 DIAGNOSIS — R634 Abnormal weight loss: Secondary | ICD-10-CM

## 2014-10-07 DIAGNOSIS — D751 Secondary polycythemia: Secondary | ICD-10-CM

## 2014-10-07 DIAGNOSIS — B1710 Acute hepatitis C without hepatic coma: Secondary | ICD-10-CM

## 2014-10-07 DIAGNOSIS — R1030 Lower abdominal pain, unspecified: Secondary | ICD-10-CM

## 2014-10-07 MED ORDER — IOPAMIDOL (ISOVUE-300) INJECTION 61%
100.0000 mL | Freq: Once | INTRAVENOUS | Status: AC | PRN
  Administered 2014-10-07: 100 mL via INTRAVENOUS

## 2014-10-09 ENCOUNTER — Telehealth: Payer: Self-pay | Admitting: Nurse Practitioner

## 2014-10-09 NOTE — Telephone Encounter (Addendum)
Patient verbalized understanding and appreciation.   ----- Message from [name], MD sent at 10/07/2014  3:10 PM EDT ----- Please call and let her know that the CT scans do not show any problems with cancer. Her liver is looking okay. There is no palpable lymph nodes. Her spleen is not enlarged. Her bones all look fine. Thank you!!

## 2014-10-30 ENCOUNTER — Telehealth: Payer: Self-pay

## 2014-10-30 NOTE — Telephone Encounter (Signed)
Signed  Cardiac clearance placed in MR nurse fax box. To be faxed to [facility]

## 2014-11-16 ENCOUNTER — Telehealth: Payer: Self-pay | Admitting: Interventional Cardiology

## 2014-11-16 NOTE — Telephone Encounter (Signed)
Will forward to [name] for advisement

## 2014-11-16 NOTE — Telephone Encounter (Signed)
Request for surgical clearance:  1. What type of surgery is being performed? Colonoscapy  2. When is this surgery scheduled? 12/01/14  3. Are there any medications that need to be held prior to surgery and how long? Wanting to know when to stop and when to start Plavics  4. Name of physician performing surgery? Dr. Earlean Shawl  5. What is your office phone and fax number? [phone] 6.

## 2014-11-17 NOTE — Telephone Encounter (Signed)
Clear for upcoming Colonoscopy. May hold plavix up to 7 days.

## 2014-11-18 NOTE — Telephone Encounter (Signed)
Cardiac clearance placed in MR nurse fax box to be faxed attn: Dr.Medoff fax # [phone]

## 2014-11-19 ENCOUNTER — Telehealth: Payer: Self-pay

## 2014-11-19 ENCOUNTER — Other Ambulatory Visit: Payer: 59

## 2014-11-19 ENCOUNTER — Ambulatory Visit: Payer: 59 | Admitting: Hematology & Oncology

## 2014-11-25 ENCOUNTER — Ambulatory Visit (INDEPENDENT_AMBULATORY_CARE_PROVIDER_SITE_OTHER): Payer: 59 | Admitting: Family Medicine

## 2014-11-25 ENCOUNTER — Ambulatory Visit (INDEPENDENT_AMBULATORY_CARE_PROVIDER_SITE_OTHER): Payer: 59

## 2014-11-25 VITALS — BP 122/72 | HR 65 | Temp 98.2°F | Resp 17 | Ht 61.0 in | Wt 127.0 lb

## 2014-11-25 DIAGNOSIS — R059 Cough, unspecified: Secondary | ICD-10-CM

## 2014-11-25 DIAGNOSIS — J209 Acute bronchitis, unspecified: Secondary | ICD-10-CM

## 2014-11-25 DIAGNOSIS — R05 Cough: Secondary | ICD-10-CM | POA: Diagnosis not present

## 2014-11-25 MED ORDER — METHYLPREDNISOLONE 4 MG PO TABS: 8.0000 mg | ORAL_TABLET | Freq: Every day | ORAL | Status: DC

## 2014-11-25 MED ORDER — AZITHROMYCIN 250 MG PO TABS: ORAL_TABLET | ORAL | Status: DC

## 2014-11-25 MED ORDER — BENZONATATE 200 MG PO CAPS: 200.0000 mg | ORAL_CAPSULE | Freq: Two times a day (BID) | ORAL | Status: DC | PRN

## 2014-11-25 NOTE — Addendum Note (Signed)
Addended by: Gustavus Bryant on: 11/25/2014 05:58 PM   Modules accepted: Orders

## 2014-11-25 NOTE — Patient Instructions (Signed)

## 2014-11-25 NOTE — Progress Notes (Signed)
Subjective:  This chart was scribed for Amber Haber MD, by [name], at [facility].  This patient was seen in room 14 and the patient's care was started at 4:13 PM.   Chief Complaint  Patient presents with   URI   Cough     Patient ID: [name], female    DOB: March 16, 1950, 64 y.o.   MRN: [id number]  HPI  HPI Comments: [name] is a 64 y.o. female who presents to the [facility] complaining of chest congestion/cough(worse at night) and states that she is unable to "get anything out" when she coughs.  Her symptoms started one week ago and states that when it all first started, she had a fever and night sweats.   She has taken tylenol and has been using her albuterol but denies any relief.   Patient had H1N1 in the past and states that she was in the hospital for about 7 days afterwards. She states that her mother now has similar symptoms.  Patient has "cut down" on smoking since she has been sick but states that she does smoke regularly.  Denies any nausea/vomitting, chest pain.  Patient works for [employer] from her home.     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 Tawnya Crook   Cartoid endartherectomy  2007   Coronary angioplasty with stent placement  10/09/2008   Allergies  Allergen Reactions   Bactrim [Sulfamethoxazole-Trimethoprim]    Diphenhydramine Hcl Hives   Prior to Admission medications   Medication Sig Start Date End Date Taking? Authorizing Provider  albuterol (PROVENTIL HFA;VENTOLIN HFA) 108 (90 BASE) MCG/ACT inhaler Inhale 2 puffs into the lungs every 6 (six) hours as needed for wheezing or shortness of breath. 02/21/14  Yes Gregor Hams, MD  ALPRAZolam Duanne Moron) 0.5 MG tablet Take 0.5 mg by mouth at bedtime.  11/27/12  Yes Historical Provider, MD  clopidogrel (PLAVIX) 75 MG tablet TAKE 1 TABLET BY MOUTH ONCE DAILY 05/15/14  Yes Belva Crome, MD  clotrimazole-betamethasone (LOTRISONE) cream as needed 12/12/12  Yes Marletta Lor, MD  Cyanocobalamin (VITAMIN B 12 PO) Take by mouth every morning.   Yes Historical Provider, MD  escitalopram (LEXAPRO) 10 MG tablet Take 5 mg by mouth daily.    Yes Historical Provider, MD  hydrochlorothiazide (MICROZIDE) 12.5 MG capsule TAKE 1 CAPSULE (12.5 MG TOTAL) BY MOUTH DAILY. 11/17/13  Yes Belva Crome, MD  lansoprazole (PREVACID) 30 MG capsule Take 30 mg by mouth  daily as needed. Ulcer/ acid reflux   Yes Historical Provider, MD  metoprolol succinate (TOPROL-XL) 100 MG 24 hr tablet TAKE 1 & 1/2 TABLETS EVERY DAY Patient taking differently: dec. 4-27- Taking 1 tab daily 11/17/13  Yes Belva Crome, MD  Multiple Vitamin (MULTIVITAMIN WITH MINERALS) TABS Take 1 tablet by mouth daily.   Yes Historical Provider, MD  nitroGLYCERIN (NITROSTAT) 0.4 MG SL tablet Place 1 tablet (0.4 mg total) under the tongue every 5 (five) minutes as needed. 11/07/13  Yes Belva Crome, MD  simvastatin (ZOCOR) 10 MG tablet Take 10 mg by  mouth daily at 6 PM.  06/12/14  Yes Historical Provider, MD  vitamin C (ASCORBIC ACID) 500 MG tablet Take 500 mg by mouth daily.   Yes Historical Provider, MD  vitamin E 400 UNIT capsule Take 400 Units by mouth daily.   Yes Historical Provider, MD   Social History   Social History   Marital Status: Single    Spouse Name: N/A   Number of Children: N/A   Years of Education: N/A   Occupational History   Not on file.   Social History Main Topics   Smoking status: Current Every Day Smoker -- 1.00 packs/day for 48 years    Types: Cigarettes    Start date: 03/20/1968   Smokeless tobacco: Never Used     Comment: 06-17-14  trying to cut back   Alcohol Use: No     Comment: occassionally   Drug Use: No     Comment: has smoked cigarettes off and on   Sexual Activity: Not Currently   Other Topics Concern   Not on file   Social History Narrative   Tobacco use cigarettes: Current smoker   Frequency 1 PPD   Estimated Pack - years :30   Smoking : yes    No Tobacco exposure   No alcohol   No exercise   Occupation : employed, works at home for [employer], Press photographer   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  HENT: Positive for congestion.   Respiratory: Positive for cough.   Cardiovascular: Negative for chest pain.  Gastrointestinal: Negative for nausea and vomiting.  Musculoskeletal: Negative for neck pain and neck stiffness.  Neurological: Negative for syncope and speech difficulty.       Objective:   Physical Exam  Constitutional: She is oriented to person, place, and time. She appears well-developed and well-nourished. No distress.  HENT:  Head: Normocephalic and atraumatic.  Eyes: Pupils are equal, round, and reactive to light.  Neck: Normal range of motion.  Pulmonary/Chest: Effort normal. No respiratory distress. She has wheezes.  Decreased breath sounds with Tubular  breath sound in the right posterior lung.   Wheezing in both sides.   Musculoskeletal: Normal range of motion.  Neurological: She is alert and oriented to person, place, and time.  Skin: Skin is warm and dry.  Psychiatric: She has a normal mood and affect. Her behavior is normal.  Nursing note and vitals reviewed.  Filed Vitals:   11/25/14 1540  BP: 122/72  Pulse: 65  Temp: 98.2 F (36.8 C)  TempSrc: Oral  Resp: 17  Height: 5\' 1"  (1.549 m)  Weight: 127 lb (57.607 kg)  SpO2: 94%    [facility] (PRIMARY) x-ray report read by Dr. Joseph Art :  CXR: no infiltrates, some peri bronchial thickening and plate like atelectasis in the right  lung.     Assessment & Plan:   This chart was scribed in my presence and reviewed by me personally.    ICD-9-CM ICD-10-CM   1. Cough 786.2 R05 DG Chest 2 View     methylPREDNISolone (MEDROL) 4 MG tablet     azithromycin (ZITHROMAX) 250 MG tablet     benzonatate (TESSALON) 200 MG capsule  2. Acute bronchitis, unspecified organism 466.0 J20.9 methylPREDNISolone (MEDROL) 4 MG tablet     azithromycin (ZITHROMAX) 250 MG tablet     benzonatate (TESSALON) 200 MG capsule   Urged to quit the cigarettes  Signed, Amber Haber, MD

## 2014-11-28 ENCOUNTER — Other Ambulatory Visit: Payer: Self-pay | Admitting: Interventional Cardiology

## 2014-12-01 ENCOUNTER — Other Ambulatory Visit: Payer: Self-pay | Admitting: Interventional Cardiology

## 2014-12-03 NOTE — Telephone Encounter (Signed)
Okay 

## 2014-12-07 ENCOUNTER — Ambulatory Visit: Payer: 59 | Admitting: Family

## 2014-12-07 ENCOUNTER — Other Ambulatory Visit: Payer: 59

## 2014-12-11 ENCOUNTER — Other Ambulatory Visit: Payer: Self-pay | Admitting: Interventional Cardiology

## 2014-12-23 ENCOUNTER — Other Ambulatory Visit: Payer: Self-pay | Admitting: Interventional Cardiology

## 2015-01-11 ENCOUNTER — Encounter: Payer: Self-pay | Admitting: Cardiovascular Disease

## 2015-01-13 ENCOUNTER — Other Ambulatory Visit: Payer: Self-pay | Admitting: Interventional Cardiology

## 2015-01-13 NOTE — Telephone Encounter (Signed)
done

## 2015-01-21 ENCOUNTER — Other Ambulatory Visit: Payer: Self-pay | Admitting: Interventional Cardiology

## 2015-01-21 ENCOUNTER — Other Ambulatory Visit: Payer: Self-pay

## 2015-01-21 DIAGNOSIS — Z1231 Encounter for screening mammogram for malignant neoplasm of breast: Secondary | ICD-10-CM

## 2015-01-21 MED ORDER — CLOPIDOGREL BISULFATE 75 MG PO TABS: 75.0000 mg | ORAL_TABLET | Freq: Every day | ORAL | Status: DC

## 2015-01-22 ENCOUNTER — Other Ambulatory Visit: Payer: 59

## 2015-01-22 ENCOUNTER — Ambulatory Visit: Payer: 59 | Admitting: Family

## 2015-01-28 ENCOUNTER — Other Ambulatory Visit: Payer: Self-pay | Admitting: Interventional Cardiology

## 2015-02-17 ENCOUNTER — Ambulatory Visit: Payer: 59

## 2015-03-01 ENCOUNTER — Encounter (HOSPITAL_COMMUNITY): Payer: Self-pay | Admitting: Oncology

## 2015-03-01 ENCOUNTER — Emergency Department (HOSPITAL_COMMUNITY): Payer: 59

## 2015-03-01 ENCOUNTER — Emergency Department (HOSPITAL_COMMUNITY)
Admission: EM | Admit: 2015-03-01 | Discharge: 2015-03-01 | Disposition: A | Discharge status: Home / Self Care | Payer: 59 | Attending: Emergency Medicine | Admitting: Emergency Medicine

## 2015-03-01 DIAGNOSIS — S3662XA Contusion of rectum, initial encounter: Secondary | ICD-10-CM | POA: Diagnosis not present

## 2015-03-01 DIAGNOSIS — Z79899 Other long term (current) drug therapy: Secondary | ICD-10-CM | POA: Diagnosis not present

## 2015-03-01 DIAGNOSIS — I10 Essential (primary) hypertension: Secondary | ICD-10-CM | POA: Diagnosis not present

## 2015-03-01 DIAGNOSIS — J159 Unspecified bacterial pneumonia: Secondary | ICD-10-CM | POA: Diagnosis not present

## 2015-03-01 DIAGNOSIS — Z8619 Personal history of other infectious and parasitic diseases: Secondary | ICD-10-CM | POA: Insufficient documentation

## 2015-03-01 DIAGNOSIS — I2510 Atherosclerotic heart disease of native coronary artery without angina pectoris: Secondary | ICD-10-CM | POA: Insufficient documentation

## 2015-03-01 DIAGNOSIS — K219 Gastro-esophageal reflux disease without esophagitis: Secondary | ICD-10-CM | POA: Diagnosis not present

## 2015-03-01 DIAGNOSIS — Z9861 Coronary angioplasty status: Secondary | ICD-10-CM | POA: Insufficient documentation

## 2015-03-01 DIAGNOSIS — Y998 Other external cause status: Secondary | ICD-10-CM | POA: Insufficient documentation

## 2015-03-01 DIAGNOSIS — Z862 Personal history of diseases of the blood and blood-forming organs and certain disorders involving the immune mechanism: Secondary | ICD-10-CM | POA: Diagnosis not present

## 2015-03-01 DIAGNOSIS — Y9289 Other specified places as the place of occurrence of the external cause: Secondary | ICD-10-CM | POA: Insufficient documentation

## 2015-03-01 DIAGNOSIS — S301XXA Contusion of abdominal wall, initial encounter: Secondary | ICD-10-CM

## 2015-03-01 DIAGNOSIS — X58XXXA Exposure to other specified factors, initial encounter: Secondary | ICD-10-CM | POA: Diagnosis not present

## 2015-03-01 DIAGNOSIS — Y9389 Activity, other specified: Secondary | ICD-10-CM | POA: Diagnosis not present

## 2015-03-01 DIAGNOSIS — Z7902 Long term (current) use of antithrombotics/antiplatelets: Secondary | ICD-10-CM | POA: Diagnosis not present

## 2015-03-01 DIAGNOSIS — F17210 Nicotine dependence, cigarettes, uncomplicated: Secondary | ICD-10-CM | POA: Diagnosis not present

## 2015-03-01 DIAGNOSIS — Z7952 Long term (current) use of systemic steroids: Secondary | ICD-10-CM | POA: Diagnosis not present

## 2015-03-01 DIAGNOSIS — Z8739 Personal history of other diseases of the musculoskeletal system and connective tissue: Secondary | ICD-10-CM | POA: Diagnosis not present

## 2015-03-01 DIAGNOSIS — S3991XA Unspecified injury of abdomen, initial encounter: Secondary | ICD-10-CM | POA: Diagnosis present

## 2015-03-01 DIAGNOSIS — J189 Pneumonia, unspecified organism: Secondary | ICD-10-CM

## 2015-03-01 LAB — COMPREHENSIVE METABOLIC PANEL
ALBUMIN: 3.6 g/dL (ref 3.5–5.0)
ALK PHOS: 66 U/L (ref 38–126)
ALT: 19 U/L (ref 14–54)
AST: 20 U/L (ref 15–41)
Anion gap: 10 (ref 5–15)
BUN: 21 mg/dL — AB (ref 6–20)
CALCIUM: 8.6 mg/dL — AB (ref 8.9–10.3)
CO2: 24 mmol/L (ref 22–32)
CREATININE: 0.72 mg/dL (ref 0.44–1.00)
Chloride: 102 mmol/L (ref 101–111)
GFR calc non Af Amer: >60 mL/min (ref >60)
GLUCOSE: 128 mg/dL — AB (ref 65–99)
Potassium: 4 mmol/L (ref 3.5–5.1)
SODIUM: 136 mmol/L (ref 135–145)
Total Bilirubin: 0.6 mg/dL (ref 0.3–1.2)
Total Protein: 6.6 g/dL (ref 6.5–8.1)

## 2015-03-01 LAB — URINALYSIS, ROUTINE W REFLEX MICROSCOPIC
Bilirubin Urine: NEGATIVE
Glucose, UA: NEGATIVE mg/dL
KETONES UR: NEGATIVE mg/dL
LEUKOCYTES UA: NEGATIVE
NITRITE: NEGATIVE
PROTEIN: NEGATIVE mg/dL
Specific Gravity, Urine: 1.016 (ref 1.005–1.030)
pH: 6 (ref 5.0–8.0)

## 2015-03-01 LAB — URINE MICROSCOPIC-ADD ON

## 2015-03-01 LAB — I-STAT CG4 LACTIC ACID, ED: LACTIC ACID, VENOUS: 1.46 mmol/L (ref 0.5–2.0)

## 2015-03-01 LAB — CBC WITH DIFFERENTIAL/PLATELET
Basophils Absolute: 0 10*3/uL (ref 0.0–0.1)
Basophils Relative: 0 %
EOS ABS: 0 10*3/uL (ref 0.0–0.7)
Eosinophils Relative: 0 %
HCT: 41.5 % (ref 36.0–46.0)
HEMOGLOBIN: 13.5 g/dL (ref 12.0–15.0)
LYMPHS ABS: 1.5 10*3/uL (ref 0.7–4.0)
Lymphocytes Relative: 17 %
MCH: 29 pg (ref 26.0–34.0)
MCHC: 32.5 g/dL (ref 30.0–36.0)
MCV: 89.1 fL (ref 78.0–100.0)
Monocytes Absolute: 0.6 10*3/uL (ref 0.1–1.0)
Monocytes Relative: 7 %
NEUTROS PCT: 76 %
Neutro Abs: 6.8 10*3/uL (ref 1.7–7.7)
Platelets: 258 10*3/uL (ref 150–400)
RBC: 4.66 MIL/uL (ref 3.87–5.11)
RDW: 14.2 % (ref 11.5–15.5)
WBC: 8.9 10*3/uL (ref 4.0–10.5)

## 2015-03-01 LAB — LIPASE, BLOOD: LIPASE: 33 U/L (ref 11–51)

## 2015-03-01 MED ORDER — IOHEXOL 300 MG/ML  SOLN
50.0000 mL | Freq: Once | INTRAMUSCULAR | Status: AC | PRN
  Administered 2015-03-01: 50 mL via ORAL

## 2015-03-01 MED ORDER — IOHEXOL 300 MG/ML  SOLN
100.0000 mL | Freq: Once | INTRAMUSCULAR | Status: AC | PRN
  Administered 2015-03-01: 100 mL via INTRAVENOUS

## 2015-03-01 MED ORDER — OXYCODONE-ACETAMINOPHEN 5-325 MG PO TABS
1.0000 | ORAL_TABLET | Freq: Once | ORAL | Status: AC
  Administered 2015-03-01: 1 via ORAL
  Filled 2015-03-01: qty 1

## 2015-03-01 MED ORDER — HYDROMORPHONE HCL 1 MG/ML IJ SOLN
1.0000 mg | Freq: Once | INTRAMUSCULAR | Status: AC
  Administered 2015-03-01: 1 mg via INTRAVENOUS
  Filled 2015-03-01: qty 1

## 2015-03-01 MED ORDER — OXYCODONE-ACETAMINOPHEN 5-325 MG PO TABS: 1.0000 | ORAL_TABLET | ORAL | Status: DC | PRN

## 2015-03-01 MED ORDER — LEVOFLOXACIN 750 MG PO TABS
750.0000 mg | ORAL_TABLET | Freq: Once | ORAL | Status: AC
  Administered 2015-03-01: 750 mg via ORAL
  Filled 2015-03-01: qty 1

## 2015-03-01 MED ORDER — SODIUM CHLORIDE 0.9 % IV BOLUS (SEPSIS)
1000.0000 mL | Freq: Once | INTRAVENOUS | Status: AC
  Administered 2015-03-01: 1000 mL via INTRAVENOUS

## 2015-03-01 MED ORDER — LEVOFLOXACIN 750 MG PO TABS: 750.0000 mg | ORAL_TABLET | Freq: Every day | ORAL | Status: DC

## 2015-03-01 MED ORDER — ONDANSETRON HCL 4 MG/2ML IJ SOLN
4.0000 mg | Freq: Once | INTRAMUSCULAR | Status: AC
  Administered 2015-03-01: 4 mg via INTRAVENOUS
  Filled 2015-03-01: qty 2

## 2015-03-01 MED ORDER — NICOTINE 14 MG/24HR TD PT24
14.0000 mg | MEDICATED_PATCH | Freq: Once | TRANSDERMAL | Status: DC
  Administered 2015-03-01: 14 mg via TRANSDERMAL
  Filled 2015-03-01: qty 1

## 2015-03-01 NOTE — ED Notes (Signed)
Pt escorted to discharge window. Pt verbalized understanding discharge instructions. In no acute distress.  

## 2015-03-01 NOTE — ED Provider Notes (Signed)
CSN: [id number]     Arrival date & time 03/01/15  [id number] History   First MD Initiated Contact with Patient 03/01/15 [phone]     Chief Complaint  Patient presents with  . Abdominal Pain     (Consider location/radiation/quality/duration/timing/severity/associated sxs/prior Treatment) HPI [name] is a 65 y.o. female with hx of hep C, htn, CAD, GERD, presents to ED with sudden onset of severe right upper abdominal pain. Pt states she has had cough for several weeks. States over last several days she has noticed that she has some pain in right upper abdomen with coughing. This morning, pt states pain did not resolve with coughing, and has been constant for about 3 hours now. She denies nausea and vomiting. She states she was able to have bowel movement this morning. She reports pain is severe, 1010. She states it radiates to the right flank into the right lower abdomen. She denies any prior abdominal surgeries. She denies any prior similar pain. She states she noticed a swelling in her right abdomen. No treatment prior to coming in. She states laying down flat and sitting now makes pain worse. Nothing she has tried actually improves it. Did not take any meds prior to coming in.   Past Medical History  Diagnosis Date  . Hepatitis C   . Hypertension   . Polycythemia 2009  . CAD (coronary artery disease)   . Arthritis   . GERD (gastroesophageal reflux disease)    Past Surgical History  Procedure Laterality Date  . 2 stints  Aug 26,2010    Dr. Tawnya Crook  . Cartoid endartherectomy  2007  . Coronary angioplasty with stent placement  10/09/2008   Family History  Problem Relation Age of Onset  . Heart disease Mother   . Heart disease Father    Social History  Substance Use Topics  . Smoking status: Current Every Day Smoker -- 1.00 packs/day for 48 years    Types: Cigarettes    Start date: 03/20/1968  . Smokeless tobacco: Never Used     Comment: 06-17-14  trying to cut back  . Alcohol Use: No      Comment: occassionally   OB History    No data available     Review of Systems  Constitutional: Negative for fever and chills.  Respiratory: Negative for cough, chest tightness and shortness of breath.   Cardiovascular: Negative for chest pain, palpitations and leg swelling.  Gastrointestinal: Positive for abdominal pain. Negative for nausea, vomiting, diarrhea and constipation.  Musculoskeletal: Negative for myalgias, arthralgias, neck pain and neck stiffness.  Skin: Negative for rash.  Neurological: Negative for dizziness, weakness and headaches.  All other systems reviewed and are negative.     Allergies  Bactrim and Diphenhydramine hcl  Home Medications   Prior to Admission medications   Medication Sig Start Date End Date Taking? Authorizing Provider  albuterol (PROVENTIL HFA;VENTOLIN HFA) 108 (90 BASE) MCG/ACT inhaler Inhale 2 puffs into the lungs every 6 (six) hours as needed for wheezing or shortness of breath. 02/21/14   Gregor Hams, MD  ALPRAZolam Duanne Moron) 0.5 MG tablet Take 0.5 mg by mouth at bedtime.  11/27/12   Historical Provider, MD  azithromycin (ZITHROMAX) 250 MG tablet Take 2 tabs PO x 1 dose, then 1 tab PO QD x 4 days 11/25/14   [name], MD  benzonatate (TESSALON) 200 MG capsule Take 1 capsule (200 mg total) by mouth 2 (two) times daily as needed for cough. 11/25/14   Synetta Shadow  Lauenstein, MD  clopidogrel (PLAVIX) 75 MG tablet Take 1 tablet (75 mg total) by mouth daily. 01/21/15   Belva Crome, MD  clotrimazole-betamethasone (LOTRISONE) cream as needed 12/12/12   Marletta Lor, MD  Cyanocobalamin (VITAMIN B 12 PO) Take by mouth every morning.    Historical Provider, MD  escitalopram (LEXAPRO) 10 MG tablet Take 5 mg by mouth daily.     Historical Provider, MD  hydrochlorothiazide (MICROZIDE) 12.5 MG capsule TAKE ONE CAPSULE BY MOUTH ONCE DAILY**NO FURTHER REFILLS UNTIL FOLLOW UP OFFICE VISIT** 01/13/15   Belva Crome, MD  lansoprazole (PREVACID) 30 MG  capsule Take 30 mg by mouth daily as needed. Ulcer/ acid reflux    Historical Provider, MD  methylPREDNISolone (MEDROL) 4 MG tablet Take 2 tablets (8 mg total) by mouth daily. 11/25/14   [name], MD  metoprolol succinate (TOPROL-XL) 100 MG 24 hr tablet TAKE ONE & ONE-HALF TABLETS BY MOUTH ONCE DAILY 01/28/15   Belva Crome, MD  Multiple Vitamin (MULTIVITAMIN WITH MINERALS) TABS Take 1 tablet by mouth daily.    Historical Provider, MD  nitroGLYCERIN (NITROSTAT) 0.4 MG SL tablet Place 1 tablet (0.4 mg total) under the tongue every 5 (five) minutes as needed. 11/07/13   Belva Crome, MD  simvastatin (ZOCOR) 10 MG tablet Take 10 mg by mouth daily at 6 PM.  06/12/14   Historical Provider, MD  vitamin C (ASCORBIC ACID) 500 MG tablet Take 500 mg by mouth daily.    Historical Provider, MD  vitamin E 400 UNIT capsule Take 400 Units by mouth daily.    Historical Provider, MD   BP 190/90 mmHg  Pulse 63  Temp(Src) 97.3 F (36.3 C) (Oral)  Resp 20  Ht 5\' 1"  (1.549 m)  Wt 58.06 kg  BMI 24.20 kg/m2  SpO2 96% Physical Exam  Constitutional: She appears well-developed and well-nourished. No distress.  HENT:  Head: Normocephalic.  Eyes: Conjunctivae are normal.  Neck: Neck supple.  Cardiovascular: Normal rate, regular rhythm and normal heart sounds.   Pulmonary/Chest: Effort normal and breath sounds normal. No respiratory distress. She has no wheezes. She has no rales.  Abdominal: Soft. Bowel sounds are normal. She exhibits no distension. There is tenderness. There is no rebound.  Palpable soft swelling to the right upper quadrant. No ttp over the lower ribs. TTP over RUQ and some over RLQ. Guarding.   Musculoskeletal: She exhibits no edema.  Neurological: She is alert.  Skin: Skin is warm and dry.  Psychiatric: She has a normal mood and affect. Her behavior is normal.  Nursing note and vitals reviewed.   ED Course  Procedures (including critical care time) Labs Review Labs Reviewed   COMPREHENSIVE METABOLIC PANEL - Abnormal; Notable for the following:    Glucose, Bld 128 (*)    BUN 21 (*)    Calcium 8.6 (*)    All other components within normal limits  URINALYSIS, ROUTINE W REFLEX MICROSCOPIC (NOT AT [facility]) - Abnormal; Notable for the following:    Hgb urine dipstick TRACE (*)    All other components within normal limits  URINE MICROSCOPIC-ADD ON - Abnormal; Notable for the following:    Squamous Epithelial / LPF 0-5 (*)    Bacteria, UA RARE (*)    All other components within normal limits  CBC WITH DIFFERENTIAL/PLATELET  LIPASE, BLOOD  I-STAT CG4 LACTIC ACID, ED    Imaging Review Ct Abdomen Pelvis W Contrast  03/01/2015  CLINICAL DATA:  65 year old female with recent  cough. Bronchitis. Right upper quadrant abdominal pain radiating to the back since last week. Initial encounter. Hepatitis-C. EXAM: CT ABDOMEN AND PELVIS WITH CONTRAST TECHNIQUE: Multidetector CT imaging of the abdomen and pelvis was performed using the standard protocol following bolus administration of intravenous contrast. CONTRAST:  166mL OMNIPAQUE IOHEXOL 300 MG/ML  SOLN COMPARISON:  CT chest,  Abdomen and Pelvis 10/07/2014 and earlier FINDINGS: New confluent medial segment right middle lobe opacity (series 6, image 1). No pleural or pericardial effusion. Negative lung bases otherwise. No acute osseous abnormality identified. 7 no pelvic free fluid. 2.4 cm probable uterine fibroid incidentally noted (series 2, image 65). Negative at adnexa. Diminutive urinary bladder. Gas and stool in the rectum which otherwise is normal. Redundant sigmoid colon with moderate to severe diverticulosis. No active inflammation. Negative left colon. Negative transverse colon. Occasional right colon diverticula with no active inflammation. Negative cecum. Appendix diminutive appendix. Flocculated material in the distal small bowel which otherwise appears normal. No dilated small bowel. Contrast distended stomach. Negative  duodenum. Enlarged right rectus muscle up to 3.8 cm in thickness (versus 9 mm normally). Superficial subcutaneous stranding about the enlarged muscle which demonstrates heterogeneous internal density along and 8 cm segment of the right abdomen. 7 cm transverse enlargement of the muscle. Additional oblique musculature enlargement tracking along the right flank (series 2, image 27), but no organized or drainable fluid collection. No free air or free fluid in the abdomen. Liver, gallbladder, spleen, pancreas and adrenal glands appear normal. Portal venous system is patent. Extensive Aortoiliac calcified atherosclerosis noted. Major arterial structures are patent. Bilateral renal enhancement and contrast excretion is within normal limits. Small retroperitoneal lymph nodes are stable. No mesenteric stranding. IMPRESSION: 1. New medial segment right middle lobe opacity compatible with bronchopneumonia in this setting. No pleural effusion. 2. Right rectus muscle hematoma encompassing 3.8 x 7.0 x 8.0 cm (AP by transverse by CC). Query coagulopathy. Mild surrounding subcutaneous fat inflammation. No organized or drainable fluid collection. 3. Otherwise stable abdomen and pelvis since August. Electronically Signed   By: Genevie Ann M.D.   On: 03/01/2015 09:16   I have personally reviewed and evaluated these images and lab results as part of my medical decision-making.   EKG Interpretation None      MDM   Final diagnoses:  CAP (community acquired pneumonia)  Rectus sheath hematoma, initial encounter    7:22 AM  Pt with sudden onset of severe pain in RUQ. Pt appears to be in a lot of pain. Exam is difficult, pt unable to tolerate it. i do feel a possible swelling to the right upper quadrant. Differential includes a hernia, mass, abdominal wall injury. Will medicate, repeat exam, labs and imaging ordered and pending.   7:54 AM Pain down to 1/10. Pt continues to have pain in RUQ, palpable mass remains, still  unsure if hernia, pt not tolerating pushing on it. CT and labs still pending.  9:50 AM Pt's CT shows pneumonia and rectal muscle hematoma. No organized or drainable fluid seen. Most likely muscle injury from coughing. She denies any abdominal injuries otherwise. She is on plavix. Hematoma not enlarging while in ED. Her pain is controlled. Pt also states she just finished zpack yesterday. Will place on levaquin. Symptomatic tx for hematoma which includes pain medications, no strenuous activity. Close follow up with PCP. Return precautions discussed.   Filed Vitals:   03/01/15 0915 03/01/15 0930 03/01/15 1000 03/01/15 1019  BP: 174/65 175/67  149/69  Pulse: 54 53 53 70  Temp:   97.5 F (36.4 C) 97.6 F (36.4 C)  TempSrc:   Oral Oral  Resp: 19 16 16 20   Height:      Weight:      SpO2:   95% 94%     Jeannett Senior, PA-C 03/01/15 [place], MD 03/02/15 [phone]

## 2015-03-01 NOTE — ED Notes (Signed)
Bed: [id number] Expected date:  Expected time:  Means of arrival:  Comments: EMS 65yo M back pain

## 2015-03-01 NOTE — ED Notes (Signed)
Pt back from CT

## 2015-03-01 NOTE — ED Notes (Signed)
Per EMS pt c/o RUQ pain that radiates to her back.  Pt reported to EMS that pain is worse w/ cough.  Per EMS there is an obvious knot in the RUQ.

## 2015-03-01 NOTE — ED Notes (Signed)
Pt using restroom and then will discharge pt

## 2015-03-01 NOTE — Discharge Instructions (Signed)
Take levaquin as prescribed until all gone. Take percocet as prescribed for pain. Avoid any strenuous activity. Try heating pads. Follow up with primary care doctor for recheck in 2 days. Return if swelling worsening.    Community-Acquired Pneumonia, Adult Pneumonia is an infection of the lungs. One type of pneumonia can happen while a person is in a hospital. A different type can happen when a person is not in a hospital (community-acquired pneumonia). It is easy for this kind to spread from person to person. It can spread to you if you breathe near an infected person who coughs or sneezes. Some symptoms include:  A dry cough.  A wet (productive) cough.  Fever.  Sweating.  Chest pain. HOME CARE  Take over-the-counter and prescription medicines only as told by your doctor.  Only take cough medicine if you are losing sleep.  If you were prescribed an antibiotic medicine, take it as told by your doctor. Do not stop taking the antibiotic even if you start to feel better.  Sleep with your head and neck raised (elevated). You can do this by putting a few pillows under your head, or you can sleep in a recliner.  Do not use tobacco products. These include cigarettes, chewing tobacco, and e-cigarettes. If you need help quitting, ask your doctor.  Drink enough water to keep your pee (urine) clear or pale yellow. A shot (vaccine) can help prevent pneumonia. Shots are often suggested for:  People older than 65 years of age.  People older than 65 years of age:  Who are having cancer treatment.  Who have long-term (chronic) lung disease.  Who have problems with their body's defense system (immune system). You may also prevent pneumonia if you take these actions:  Get the flu (influenza) shot every year.  Go to the dentist as often as told.  Wash your hands often. If soap and water are not available, use hand sanitizer. GET HELP IF:  You have a fever.  You lose sleep because your  cough medicine does not help. GET HELP RIGHT AWAY IF:  You are short of breath and it gets worse.  You have more chest pain.  Your sickness gets worse. This is very serious if:  You are an older adult.  Your body's defense system is weak.  You cough up blood.   This information is not intended to replace advice given to you by your health care provider. Make sure you discuss any questions you have with your health care provider.   Document Released: 07/26/2007 Document Revised: 10/28/2014 Document Reviewed: 06/03/2014 Elsevier Interactive Patient Education 2016 [employer]  Hematoma A hematoma is a collection of blood under the skin, in an organ, in a body space, in a joint space, or in other tissue. The blood can clot to form a lump that you can see and feel. The lump is often firm and may sometimes become sore and tender. Most hematomas get better in a few days to weeks. However, some hematomas may be serious and require medical care. Hematomas can range in size from very small to very large. CAUSES  A hematoma can be caused by a blunt or penetrating injury. It can also be caused by spontaneous leakage from a blood vessel under the skin. Spontaneous leakage from a blood vessel is more likely to occur in older people, especially those taking blood thinners. Sometimes, a hematoma can develop after certain medical procedures. SIGNS AND SYMPTOMS   A firm lump on the body.  Possible pain and tenderness in the area.  Bruising.Blue, dark blue, purple-red, or yellowish skin may appear at the site of the hematoma if the hematoma is close to the surface of the skin. For hematomas in deeper tissues or body spaces, the signs and symptoms may be subtle. For example, an intra-abdominal hematoma may cause abdominal pain, weakness, fainting, and shortness of breath. An intracranial hematoma may cause a headache or symptoms such as weakness, trouble speaking, or a change in  consciousness. DIAGNOSIS  A hematoma can usually be diagnosed based on your medical history and a physical exam. Imaging tests may be needed if your health care provider suspects a hematoma in deeper tissues or body spaces, such as the abdomen, head, or chest. These tests may include ultrasonography or a CT scan.  TREATMENT  Hematomas usually go away on their own over time. Rarely does the blood need to be drained out of the body. Large hematomas or those that may affect vital organs will sometimes need surgical drainage or monitoring. HOME CARE INSTRUCTIONS   Apply ice to the injured area:   Put ice in a plastic bag.   Place a towel between your skin and the bag.   Leave the ice on for 20 minutes, 2-3 times a day for the first 1 to 2 days.   After the first 2 days, switch to using warm compresses on the hematoma.   Elevate the injured area to help decrease pain and swelling. Wrapping the area with an elastic bandage may also be helpful. Compression helps to reduce swelling and promotes shrinking of the hematoma. Make sure the bandage is not wrapped too tight.   If your hematoma is on a lower extremity and is painful, crutches may be helpful for a couple days.   Only take over-the-counter or prescription medicines as directed by your health care provider. SEEK IMMEDIATE MEDICAL CARE IF:   You have increasing pain, or your pain is not controlled with medicine.   You have a fever.   You have worsening swelling or discoloration.   Your skin over the hematoma breaks or starts bleeding.   Your hematoma is in your chest or abdomen and you have weakness, shortness of breath, or a change in consciousness.  Your hematoma is on your scalp (caused by a fall or injury) and you have a worsening headache or a change in alertness or consciousness. MAKE SURE YOU:   Understand these instructions.  Will watch your condition.  Will get help right away if you are not doing well or get  worse.   This information is not intended to replace advice given to you by your health care provider. Make sure you discuss any questions you have with your health care provider.   Document Released: 09/21/2003 Document Revised: 10/09/2012 Document Reviewed: 07/17/2012 Elsevier Interactive Patient Education [employer].

## 2015-03-01 NOTE — ED Notes (Signed)
md at bedside  Pt alert and oriented x4. Respirations even and unlabored, bilateral symmetrical rise and fall of chest. Skin warm and dry. In no acute distress. Denies needs.   

## 2015-03-01 NOTE — ED Notes (Signed)
Pt to CT

## 2015-03-08 ENCOUNTER — Telehealth: Payer: Self-pay

## 2015-03-08 ENCOUNTER — Telehealth: Payer: Self-pay | Admitting: Interventional Cardiology

## 2015-03-08 DIAGNOSIS — R0789 Other chest pain: Secondary | ICD-10-CM

## 2015-03-08 NOTE — Telephone Encounter (Signed)
Returned pt call. lmtcb if assistance is still needed 

## 2015-03-08 NOTE — Telephone Encounter (Signed)
New Message   Pt c/o of Chest Pressure: STAT if CP now or developed within 24 hours  1. Are you having CP right now? Not at this moment it comes and it goes   2. Are you experiencing any other symptoms (ex. SOB, nausea, vomiting, sweating)? Sweating last night. No other symptoms   3. How long have you been experiencing CP? It started on 03/05/2015  4. Is your CP continuous or coming and going? Coming and going   5. Have you taken Nitroglycerin? Yes. 1 on 03/07/2015 and 2 03/06/2015 (When she takes them it does go away but 45 minutes it returns)  ? Coming: Jaw and teeth pain. Aching left arm. pain in back.. Neck is really sore

## 2015-03-08 NOTE — Telephone Encounter (Signed)
Patient is a walk-in. Patient complaining of chest pressure with pain radiating to left arm, neck, head, and jaw off and on since Friday evening. Patient stated the chest pressure goes away when she takes nitroglycerin, but the chest pressure comes back. BP 148/72, HR 60, Resp.18, and O2 98% on room air. Patient informed me that she had a hematoma right lower quad and was seen in the ED last week. Patient was off her Plavix for 5 days, and just restarted on Saturday. Patient has bruising from her right lower abdomin, hip and right groin area. Patient has a history of CAD and has had stents in the past. EKG showed Sinus Bradycardia with HR 59. Consulted Truitt Merle NP, she recommends patient going to ED or at least be seen in [facility] clinic. The first available FLEX appointment in tomorrow. Patient instructed to go to ED. Patient refused to go to ED at this time, but will come in tomorrow to be seen in [facility]. Encouraged patient to go to ED if her chest pressure comes back or becomes worse.

## 2015-03-08 NOTE — Addendum Note (Signed)
Addended by: Aris Georgia, Rane Blitch L on: 03/08/2015 02:44 PM   Modules accepted: Orders

## 2015-03-08 NOTE — Telephone Encounter (Signed)
Pt walked in to the office. Addressed by a triage nurse

## 2015-03-09 ENCOUNTER — Encounter: Payer: Self-pay | Admitting: Physician Assistant

## 2015-03-09 ENCOUNTER — Encounter: Payer: Self-pay | Admitting: *Deleted

## 2015-03-09 ENCOUNTER — Ambulatory Visit (INDEPENDENT_AMBULATORY_CARE_PROVIDER_SITE_OTHER): Payer: 59 | Admitting: Physician Assistant

## 2015-03-09 VITALS — BP 106/62 | HR 65 | Ht 61.0 in | Wt 127.6 lb

## 2015-03-09 DIAGNOSIS — R079 Chest pain, unspecified: Secondary | ICD-10-CM | POA: Diagnosis not present

## 2015-03-09 DIAGNOSIS — Z720 Tobacco use: Secondary | ICD-10-CM | POA: Insufficient documentation

## 2015-03-09 DIAGNOSIS — I2510 Atherosclerotic heart disease of native coronary artery without angina pectoris: Secondary | ICD-10-CM | POA: Diagnosis not present

## 2015-03-09 DIAGNOSIS — I10 Essential (primary) hypertension: Secondary | ICD-10-CM | POA: Diagnosis not present

## 2015-03-09 DIAGNOSIS — I2583 Coronary atherosclerosis due to lipid rich plaque: Secondary | ICD-10-CM

## 2015-03-09 DIAGNOSIS — I209 Angina pectoris, unspecified: Secondary | ICD-10-CM

## 2015-03-09 MED ORDER — NITROGLYCERIN 0.4 MG SL SUBL: 0.4000 mg | SUBLINGUAL_TABLET | SUBLINGUAL | Status: DC | PRN

## 2015-03-09 NOTE — Progress Notes (Signed)
Patient ID: [name], female   DOB: [birth year], 65 y.o.   MRN: [id number]    Date:  03/09/2015   ID:  [name], DOB 15-Nov-1950, MRN [id number]  PCP:  Wenda Low, MD  Primary Cardiologist:  Tamala Julian   Chief complaint: Chest pressure   History of Present Illness: [name] is a 65 y.o. female with a history of hepatitis C, hypertension, coronary artery disease(2 stents), peripheral vascular disease with bilateral carotid artery stenosis, GERD, tobacco abuse.   Last carotid duplex was July 2016 and revealed Heterogeneous plaque, bilaterally. Stable 1-39% RICA stenosis, s/p CEA with DPA. Stable 123456 LICA stenosis. >50% LECA stenosis.  Normal subclavian arteries, bilaterally.  Patent vertebral arteries with antegrade flow.    Patient presents today with complaints of chest pressure started on Friday. She says it radiates to her left arm.  She's also had some back pain described as sharp. She's used similar to glycerin Friday Saturday and Sunday and each time it helped relieve the pain after about 10 minutes. She had a problem with bronchitis and was treated with azithromycin then Levaquin. Due to all the coughing she developed a hematoma in the rectus abdominis muscle.  She stopped her Plavix by herself for about 1 week and then restarted this past Saturday.  She's had headache for 3 days starting Friday.  He was here yesterday for her mother's appointment and she broke out with a sweat. EKG was completed and showed sinus bradycardia rate 59 bpm.  The patient currently denies nausea, vomiting, fever, shortness of breath beyond her baseline, orthopnea, dizziness, PND, congestion, hematochezia, melena, lower extremity edema, claudication.    Wt Readings from Last 3 Encounters:  03/09/15 127 lb 9.6 oz (57.879 kg)  03/01/15 128 lb (58.06 kg)  11/25/14 127 lb (57.607 kg)     Past Medical History  Diagnosis Date  . Hepatitis C   . Hypertension   . Polycythemia 2009  . CAD  (coronary artery disease)   . Arthritis   . GERD (gastroesophageal reflux disease)     Current Outpatient Prescriptions  Medication Sig Dispense Refill  . acetaminophen (TYLENOL) 500 MG tablet Take 500-1,000 mg by mouth every 6 (six) hours as needed for mild pain or headache.    . albuterol (PROVENTIL HFA;VENTOLIN HFA) 108 (90 BASE) MCG/ACT inhaler Inhale 2 puffs into the lungs every 6 (six) hours as needed for wheezing or shortness of breath. 1 Inhaler 2  . ALPRAZolam (XANAX) 0.5 MG tablet Take 0.5 mg by mouth at bedtime.     . benzonatate (TESSALON) 200 MG capsule Take 200 mg by mouth 2 (two) times daily as needed. (cough)    . clopidogrel (PLAVIX) 75 MG tablet Take 1 tablet (75 mg total) by mouth daily. 30 tablet 6  . clotrimazole-betamethasone (LOTRISONE) cream use 1 application to affected area as needed for sore areas on body    . Cyanocobalamin (VITAMIN B 12 PO) Take 1 tablet by mouth daily.     Marland Kitchen escitalopram (LEXAPRO) 10 MG tablet Take 10 mg by mouth daily.     . hydrochlorothiazide (MICROZIDE) 12.5 MG capsule TAKE ONE CAPSULE BY MOUTH ONCE DAILY**NO FURTHER REFILLS UNTIL FOLLOW UP OFFICE VISIT** 30 capsule 2  . lansoprazole (PREVACID) 30 MG capsule Take 30 mg by mouth daily as needed (for ulcer/acid reflex). Ulcer/ acid reflux    . metoprolol succinate (TOPROL-XL) 100 MG 24 hr tablet Take 150 mg by mouth daily. Take with or immediately following a meal.    .  nitroGLYCERIN (NITROSTAT) 0.4 MG SL tablet Place 0.4 mg under the tongue every 5 (five) minutes as needed for chest pain.    Marland Kitchen oxyCODONE-acetaminophen (PERCOCET) 5-325 MG tablet Take 1 tablet by mouth every 4 (four) hours as needed for severe pain. 20 tablet 0  . simvastatin (ZOCOR) 10 MG tablet Take 10 mg by mouth daily.     . vitamin E 400 UNIT capsule Take 400 Units by mouth daily.     No current facility-administered medications for this visit.    Allergies:    Allergies  Allergen Reactions  . Bactrim  [Sulfamethoxazole-Trimethoprim] Nausea And Vomiting    Social History:  The patient  reports that she has been smoking Cigarettes.  She started smoking about 47 years ago. She has a 48 pack-year smoking history. She has never used smokeless tobacco. She reports that she does not drink alcohol or use illicit drugs.   Family history:   Family History  Problem Relation Age of Onset  . Heart disease Mother   . Heart disease Father     ROS:  Please see the history of present illness.  All other systems reviewed and negative.   PHYSICAL EXAM: VS:  BP 106/62 mmHg  Pulse 65  Ht 5\' 1"  (1.549 m)  Wt 127 lb 9.6 oz (57.879 kg)  BMI 24.12 kg/m2  SpO2 93% Well nourished, well developed, in no acute distress HEENT: Pupils are equal round react to light accommodation extraocular movements are intact.  Neck: no JVDNo cervical lymphadenopathy. Cardiac: Regular rate and rhythm without murmurs rubs or gallops. Lungs:  clear to auscultation bilaterally, no wheezing, rhonchi or rales Abd: Some midline right-sided tenderness, she is a very large area of ecchymosis on the right side extending down into her groin. She has a large hematoma midline to right-sided.  positive bowel sounds all quadrants, no hepatosplenomegaly Ext: no lower extremity edema.  2+ radial and dorsalis pedis pulses. Skin: warm and dry Neuro:  Grossly normal       ASSESSMENT AND PLAN:  Problem List Items Addressed This Visit    Tobacco abuse   Essential hypertension   Relevant Medications   nitroGLYCERIN (NITROSTAT) 0.4 MG SL tablet   metoprolol succinate (TOPROL-XL) 100 MG 24 hr tablet   Coronary atherosclerosis   Relevant Medications   nitroGLYCERIN (NITROSTAT) 0.4 MG SL tablet   metoprolol succinate (TOPROL-XL) 100 MG 24 hr tablet   Chest pain - Primary   Relevant Orders   Myocardial Perfusion Imaging      Patient's chest pressure does have typical features. Appears to radiate to her left arm. She is also reporting  some midthoracic pain which is not reproducible. There seems to be nitroglycerin response to the chest pain. She continues to smoke and is at high risk for progressive CAD. I did offer to start her on Imdur 15 mg, although her blood pressure is on the lower side. She declined. I will refill her nitroglycerin sublingual as it may be out of date. She'll be scheduled for Lexiscan stress test. She is currently chest pain-free.  She has picked a quit date for smoking in February.

## 2015-03-09 NOTE — Patient Instructions (Signed)
Medication Instructions:  No medication changes today  Labwork: none  Testing/Procedures: Your physician has requested that you have a lexiscan myoview. For further information please visit [email or web address]. Please follow instruction sheet, as given.    Follow-Up: Your physician recommends that you schedule a follow-up appointment in: 3 months with Dr Tamala Julian.       If you need a refill on your cardiac medications before your next appointment, please call your pharmacy.

## 2015-03-12 ENCOUNTER — Telehealth: Payer: Self-pay | Admitting: Hematology & Oncology

## 2015-03-12 NOTE — Telephone Encounter (Signed)
Patient called and resch 01/22/15 cx apt to 03/19/15

## 2015-03-16 ENCOUNTER — Telehealth (HOSPITAL_COMMUNITY): Payer: Self-pay | Admitting: *Deleted

## 2015-03-16 NOTE — Telephone Encounter (Signed)
Left message on voicemail in reference to upcoming appointment scheduled for 03/18/15. Phone number given for a call back so details instructions can be given. [name]    

## 2015-03-17 ENCOUNTER — Telehealth (HOSPITAL_COMMUNITY): Payer: Self-pay | Admitting: *Deleted

## 2015-03-17 NOTE — Telephone Encounter (Signed)
Left message on voicemail in reference to upcoming appointment scheduled for 03/18/15. Phone number given for a call back so details instructions can be given. Hubbard Robinson, RN

## 2015-03-18 ENCOUNTER — Encounter (HOSPITAL_COMMUNITY): Payer: 59

## 2015-03-19 ENCOUNTER — Encounter: Payer: Self-pay | Admitting: Family

## 2015-03-19 ENCOUNTER — Ambulatory Visit (HOSPITAL_BASED_OUTPATIENT_CLINIC_OR_DEPARTMENT_OTHER): Payer: 59 | Admitting: Family

## 2015-03-19 ENCOUNTER — Other Ambulatory Visit (HOSPITAL_BASED_OUTPATIENT_CLINIC_OR_DEPARTMENT_OTHER): Payer: 59

## 2015-03-19 VITALS — BP 185/66 | HR 60 | Temp 98.5°F | Resp 16 | Ht 61.0 in | Wt 127.0 lb

## 2015-03-19 DIAGNOSIS — D751 Secondary polycythemia: Secondary | ICD-10-CM | POA: Diagnosis not present

## 2015-03-19 DIAGNOSIS — R634 Abnormal weight loss: Secondary | ICD-10-CM

## 2015-03-19 DIAGNOSIS — B1710 Acute hepatitis C without hepatic coma: Secondary | ICD-10-CM

## 2015-03-19 DIAGNOSIS — R1030 Lower abdominal pain, unspecified: Secondary | ICD-10-CM

## 2015-03-19 LAB — COMPREHENSIVE METABOLIC PANEL (CC13)
A/G RATIO: 1.4 (ref 1.1–2.5)
ALBUMIN: 4 g/dL (ref 3.6–4.8)
ALK PHOS: 91 IU/L (ref 39–117)
ALT: 18 IU/L (ref 0–32)
AST (SGOT): 26 IU/L (ref 0–40)
BILIRUBIN TOTAL: 0.7 mg/dL (ref 0.0–1.2)
BUN / CREAT RATIO: 18 (ref 11–26)
BUN: 12 mg/dL (ref 8–27)
CHLORIDE: 100 mmol/L (ref 96–106)
Calcium, Ser: 9.5 mg/dL (ref 8.7–10.3)
Carbon Dioxide, Total: 28 mmol/L (ref 18–29)
Creatinine, Ser: 0.67 mg/dL (ref 0.57–1.00)
GFR calc Af Amer: 107 mL/min/{1.73_m2} (ref >59)
GFR calc non Af Amer: 93 mL/min/{1.73_m2} (ref >59)
GLOBULIN, TOTAL: 2.8 g/dL (ref 1.5–4.5)
Glucose: 112 mg/dL — ABNORMAL HIGH (ref 65–99)
Potassium, Ser: 5.4 mmol/L — ABNORMAL HIGH (ref 3.5–5.2)
SODIUM: 137 mmol/L (ref 134–144)
Total Protein: 6.8 g/dL (ref 6.0–8.5)

## 2015-03-19 LAB — CBC WITH DIFFERENTIAL (CANCER CENTER ONLY)
BASO#: 0 10*3/uL (ref 0.0–0.2)
BASO%: 0.3 % (ref 0.0–2.0)
EOS ABS: 0.1 10*3/uL (ref 0.0–0.5)
EOS%: 0.9 % (ref 0.0–7.0)
HEMATOCRIT: 44.4 % (ref 34.8–46.6)
HEMOGLOBIN: 14.6 g/dL (ref 11.6–15.9)
LYMPH#: 2.1 10*3/uL (ref 0.9–3.3)
LYMPH%: 28.6 % (ref 14.0–48.0)
MCH: 29.6 pg (ref 26.0–34.0)
MCHC: 32.9 g/dL (ref 32.0–36.0)
MCV: 90 fL (ref 81–101)
MONO#: 0.7 10*3/uL (ref 0.1–0.9)
MONO%: 9.7 % (ref 0.0–13.0)
NEUT%: 60.5 % (ref 39.6–80.0)
NEUTROS ABS: 4.5 10*3/uL (ref 1.5–6.5)
Platelets: 225 10*3/uL (ref 145–400)
RBC: 4.93 10*6/uL (ref 3.70–5.32)
RDW: 14.7 % (ref 11.1–15.7)
WBC: 7.4 10*3/uL (ref 3.9–10.0)

## 2015-03-19 NOTE — Progress Notes (Signed)
Hematology and Oncology Follow Up Visit  [name] [id number] February 24, 1950 65 y.o. 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 She has had a rough few weeks. She was in the ED and was diagnosed with pneumonia and a right rectus muscle hematoma. She stopped her plavix for 1 week and has since restarted it.  She had some chest pain that radiated down her left arm while she had pneumonia (this resolved with nitro tablet) so she will be having a stress test on Monday (03/22/15).   She is feeling much better now. Her right abdominal pain from the hematoma is improving as is her cough.  She is still smoking and keeps a dry cough. She denies SOB.  No fever, chills, n/v, rash, dizziness, chest pain, palpitations, abdominal pain or changes in bowel or bladder habits. She is avoiding dairy at this time as she feels it gives her diarrhea.  Her appetite comes and goes and she feels that she should Her weight is stable.  She is still under a lot of stress at home with work and caring for her 63 yo mother.  Medications:    Medication List       This list is accurate as of: 03/19/15  3:21 PM.  Always use your most recent med list.               acetaminophen 500 MG tablet  Commonly known as:  TYLENOL  Take 500-1,000 mg by mouth every 6 (six) hours as needed for mild pain or headache.     albuterol 108 (90 Base) MCG/ACT inhaler  Commonly known as:  PROVENTIL HFA;VENTOLIN HFA  Inhale 2 puffs into the lungs every 6 (six) hours as needed for wheezing or shortness of breath.     ALPRAZolam 0.5 MG tablet  Commonly known as:  XANAX  Take 0.5 mg by mouth at bedtime.     benzonatate 200 MG capsule  Commonly known as:  TESSALON  Take 200 mg by mouth 2 (two) times daily as  needed. (cough)     clopidogrel 75 MG tablet  Commonly known as:  PLAVIX  Take 1 tablet (75 mg total) by mouth daily.     clotrimazole-betamethasone cream  Commonly known as:  LOTRISONE  use 1 application to affected area as needed for sore areas on body     escitalopram 10 MG tablet  Commonly known as:  LEXAPRO  Take 10 mg by mouth daily.     hydrochlorothiazide 12.5 MG capsule  Commonly known as:  MICROZIDE  TAKE ONE CAPSULE BY MOUTH ONCE DAILY**NO FURTHER REFILLS UNTIL FOLLOW UP OFFICE VISIT**     lansoprazole 30 MG capsule  Commonly known as:  PREVACID  Take 30 mg by mouth daily as needed (for ulcer/acid reflex). Ulcer/ acid reflux     metoprolol succinate 100 MG 24 hr tablet  Commonly known as:  TOPROL-XL  Take 150 mg by mouth daily. Take with or immediately following a meal.     nitroGLYCERIN 0.4 MG SL tablet  Commonly known as:  NITROSTAT  Place 1 tablet (0.4 mg total) under the tongue every 5 (five) minutes as needed for chest pain.     oxyCODONE-acetaminophen 5-325 MG tablet  Commonly known as:  PERCOCET  Take 1 tablet by mouth every 4 (four) hours as needed for severe pain.     simvastatin 10 MG tablet  Commonly known as:  ZOCOR  Take 10 mg by mouth daily.     VITAMIN B 12 PO  Take 1 tablet by mouth daily.     vitamin E 400 UNIT capsule  Take 400 Units by mouth daily.        Allergies:  Allergies  Allergen Reactions  . Bactrim [Sulfamethoxazole-Trimethoprim] Nausea And Vomiting    Past Medical History, Surgical history, Social history, and Family History were reviewed and updated.  Review of Systems: All other 10 point review of systems is negative.   Physical Exam:  height is 5\' 1"  (1.549 m) and weight is 127 lb (57.607 kg). Her oral temperature is 98.5 F (36.9 C). Her blood pressure is 185/66 and her pulse is 60. Her respiration is 16.   Wt Readings from Last 3 Encounters:  03/19/15 127 lb (57.607 kg)  03/09/15 127 lb 9.6 oz (57.879 kg)    03/01/15 128 lb (58.06 kg)    Ocular: Sclerae unicteric, pupils equal, round and reactive to light Ear-nose-throat: Oropharynx clear, dentition fair Lymphatic: No cervical supraclavicular or axillary adenopathy Lungs no rales or rhonchi, good excursion bilaterally Heart regular rate and rhythm, no murmur appreciated Abd soft, nontender, positive bowel sounds, no liver or spleen tip palpated on exam.  MSK no focal spinal tenderness, no joint edema Neuro: non-focal, well-oriented, appropriate affect Breasts: Deferred  Lab Results  Component Value Date   WBC 7.4 03/19/2015   HGB 14.6 03/19/2015   HCT 44.4 03/19/2015   MCV 90 03/19/2015   PLT 225 03/19/2015   Lab Results  Component Value Date   FERRITIN 13 09/16/2014   IRON 37* 09/16/2014   TIBC 454* 09/16/2014   UIBC 417* 09/16/2014   IRONPCTSAT 8* 09/16/2014   Lab Results  Component Value Date   RETICCTPCT 1.2 08/03/2010   RBC 4.93 03/19/2015   RETICCTABS 61.3 08/03/2010   No results found for: KPAFRELGTCHN, LAMBDASER, KAPLAMBRATIO No results found for: IGGSERUM, IGA, IGMSERUM No results found for: Odetta Pink, SPEI   Chemistry      Component Value Date/Time   NA 136 03/01/2015 0745   NA 138 09/16/2014 1149   NA 140 06/17/2014 1036   K 4.0 03/01/2015 0745   K 3.9 09/16/2014 1149   K 3.9 06/17/2014 1036   CL 102 03/01/2015 0745   CL 100 06/17/2014 1036   CO2 24 03/01/2015 0745   CO2 26 09/16/2014 1149   CO2 29 06/17/2014 1036   BUN 21* 03/01/2015 0745   BUN 14.0 09/16/2014 1149   BUN 15 06/17/2014 1036   CREATININE 0.72 03/01/2015 0745   CREATININE 0.8 09/16/2014 1149   CREATININE 1.0 06/17/2014 1036      Component Value Date/Time   CALCIUM 8.6* 03/01/2015 0745   CALCIUM 9.2 09/16/2014 1149   CALCIUM 9.5 06/17/2014 1036   ALKPHOS 66 03/01/2015 0745   ALKPHOS 99 09/16/2014 1149   ALKPHOS 83 06/17/2014 1036   AST 20 03/01/2015 0745   AST 20  09/16/2014 1149   AST 30 06/17/2014 1036   ALT 19 03/01/2015 0745   ALT 10 09/16/2014 1149   ALT 18 06/17/2014 1036   BILITOT 0.6 03/01/2015 0745   BILITOT 0.40 09/16/2014 1149   BILITOT 0.70 06/17/2014 1036     Impression and Plan: [name] is a 65 yo white  female with polycythemia. She is doing fairly despite having an episodes of pneumonia and developing a right rectus muscle hematoma from coughing.  Her last phlebotomy was in March 2016. Her Hct today is 44.4. I spoke with Dr. Marin Olp regarding this and it was decided to hold of on phlebotomizing her at this time. She is currently asymptomatic.  She will have her stress test on Monday.  We will plan to see her back in 2 months for labs and follow-up.  She will contact us with any questions or concerns. We can certainly see her sooner if need be.   Eliezer Bottom, NP 1/27/20173:21 PM

## 2015-03-22 ENCOUNTER — Ambulatory Visit (HOSPITAL_COMMUNITY): Payer: 59 | Attending: Internal Medicine

## 2015-03-22 DIAGNOSIS — I779 Disorder of arteries and arterioles, unspecified: Secondary | ICD-10-CM | POA: Insufficient documentation

## 2015-03-22 DIAGNOSIS — I10 Essential (primary) hypertension: Secondary | ICD-10-CM | POA: Diagnosis not present

## 2015-03-22 DIAGNOSIS — I739 Peripheral vascular disease, unspecified: Secondary | ICD-10-CM | POA: Diagnosis not present

## 2015-03-22 DIAGNOSIS — R002 Palpitations: Secondary | ICD-10-CM | POA: Insufficient documentation

## 2015-03-22 DIAGNOSIS — R079 Chest pain, unspecified: Secondary | ICD-10-CM | POA: Diagnosis present

## 2015-03-22 DIAGNOSIS — R0609 Other forms of dyspnea: Secondary | ICD-10-CM | POA: Insufficient documentation

## 2015-03-22 LAB — MYOCARDIAL PERFUSION IMAGING
CHL CUP NUCLEAR SDS: 1
CHL CUP NUCLEAR SRS: 5
CHL CUP NUCLEAR SSS: 6
CHL CUP RESTING HR STRESS: 65 {beats}/min
LV dias vol: 71 mL
LV sys vol: 36 mL
Peak HR: 92 {beats}/min
RATE: 0.24
TID: 0.99

## 2015-03-22 MED ORDER — REGADENOSON 0.4 MG/5ML IV SOLN
0.4000 mg | Freq: Once | INTRAVENOUS | Status: AC
  Administered 2015-03-22: 0.4 mg via INTRAVENOUS

## 2015-03-22 MED ORDER — TECHNETIUM TC 99M SESTAMIBI GENERIC - CARDIOLITE
10.5000 | Freq: Once | INTRAVENOUS | Status: AC | PRN
  Administered 2015-03-22: 11 via INTRAVENOUS

## 2015-03-22 MED ORDER — TECHNETIUM TC 99M SESTAMIBI GENERIC - CARDIOLITE
30.6000 | Freq: Once | INTRAVENOUS | Status: AC | PRN
  Administered 2015-03-22: 31 via INTRAVENOUS

## 2015-04-05 ENCOUNTER — Encounter: Payer: Self-pay | Admitting: Interventional Cardiology

## 2015-04-16 ENCOUNTER — Ambulatory Visit: Payer: 59 | Admitting: Interventional Cardiology

## 2015-04-25 ENCOUNTER — Other Ambulatory Visit: Payer: Self-pay | Admitting: Interventional Cardiology

## 2015-05-04 ENCOUNTER — Encounter: Payer: Self-pay | Admitting: Interventional Cardiology

## 2015-05-04 ENCOUNTER — Other Ambulatory Visit: Payer: Self-pay

## 2015-05-04 ENCOUNTER — Ambulatory Visit (INDEPENDENT_AMBULATORY_CARE_PROVIDER_SITE_OTHER): Payer: 59 | Admitting: Interventional Cardiology

## 2015-05-04 VITALS — BP 116/68 | HR 65 | Ht 60.5 in | Wt 127.0 lb

## 2015-05-04 DIAGNOSIS — I10 Essential (primary) hypertension: Secondary | ICD-10-CM

## 2015-05-04 DIAGNOSIS — Z720 Tobacco use: Secondary | ICD-10-CM | POA: Diagnosis not present

## 2015-05-04 DIAGNOSIS — D45 Polycythemia vera: Secondary | ICD-10-CM

## 2015-05-04 DIAGNOSIS — I2510 Atherosclerotic heart disease of native coronary artery without angina pectoris: Secondary | ICD-10-CM | POA: Diagnosis not present

## 2015-05-04 DIAGNOSIS — I6529 Occlusion and stenosis of unspecified carotid artery: Secondary | ICD-10-CM | POA: Diagnosis not present

## 2015-05-04 DIAGNOSIS — I6523 Occlusion and stenosis of bilateral carotid arteries: Secondary | ICD-10-CM

## 2015-05-04 NOTE — Patient Instructions (Addendum)
Medication Instructions:  Your physician recommends that you continue on your current medications as directed. Please refer to the Current Medication list given to you today.   Labwork: None ordered  Testing/Procedures: Your physician has requested that you have a carotid duplex. This test is an ultrasound of the carotid arteries in your neck. It looks at blood flow through these arteries that supply the brain with blood. Allow one hour for this exam. There are no restrictions or special instructions. ( To be scheduled in July 2017)  Follow-Up: Your physician wants you to follow-up in: 1 year  You will receive a reminder letter in the mail two months in advance. If you don't receive a letter, please call our office to schedule the follow-up appointment.   Any Other Special Instructions Will Be Listed Below (If Applicable). Your physician discussed the importance of regular exercise and recommended that you start or continue a regular exercise program for good health.       If you need a refill on your cardiac medications before your next appointment, please call your pharmacy.

## 2015-05-04 NOTE — Progress Notes (Signed)
Cardiology Office Note   Date:  05/04/2015   ID:  KARIAH GIFFEN, DOB 11-08-1950, MRN [id number]  PCP:  Wenda Low, MD  Cardiologist:  Sinclair Grooms, MD   Chief Complaint  Patient presents with  . Coronary Artery Disease      History of Present Illness: [name] is a 65 y.o. female who presents for Follow-up of coronary artery disease, hypertension, carotid artery disease, history of carotid endarterectomy, and hyperlipidemia. She also has polycythemia.  Chong Sicilian is doing relatively well. She now lives with her mother who is not able to live independently. She is under stress both physical and emotional. She is working a 40 hour week. She is unable to exercise. She was having some chest discomfort earlier this year and underwent a myocardial perfusion study with Lexiscan stress there reveal no significant ischemia and normal LV function. Since the study was done, she states that there has been overall improvement and no recurrences of chest discomfort.  Past Medical History  Diagnosis Date  . Hepatitis C   . Hypertension   . Polycythemia 2009  . CAD (coronary artery disease)   . Arthritis   . GERD (gastroesophageal reflux disease)     Past Surgical History  Procedure Laterality Date  . 2 stints  Aug 26,2010    Dr. Tawnya Crook  . Cartoid endartherectomy  2007  . Coronary angioplasty with stent placement  10/09/2008     Current Outpatient Prescriptions  Medication Sig Dispense Refill  . acetaminophen (TYLENOL) 500 MG tablet Take 500-1,000 mg by mouth every 6 (six) hours as needed for mild pain or headache.    . albuterol (PROVENTIL HFA;VENTOLIN HFA) 108 (90 BASE) MCG/ACT inhaler Inhale 2 puffs into the lungs every 6 (six) hours as needed for wheezing or shortness of breath. 1 Inhaler 2  . ALPRAZolam (XANAX) 0.5 MG tablet Take 0.5 mg by mouth at bedtime.     . clopidogrel (PLAVIX) 75 MG tablet Take 1 tablet (75 mg total) by mouth daily. 30 tablet 6  .  clotrimazole-betamethasone (LOTRISONE) cream use 1 application to affected area as needed for sore areas on body    . Cyanocobalamin (VITAMIN B 12 PO) Take 1 tablet by mouth daily.     Marland Kitchen escitalopram (LEXAPRO) 10 MG tablet Take 10 mg by mouth daily.     . hydrochlorothiazide (MICROZIDE) 12.5 MG capsule Take 1 capsule (12.5 mg total) by mouth daily. 30 capsule 9  . lansoprazole (PREVACID) 30 MG capsule Take 30 mg by mouth daily as needed (for ulcer/acid reflex). Ulcer/ acid reflux    . metoprolol succinate (TOPROL-XL) 100 MG 24 hr tablet Take 150 mg by mouth daily. Take with or immediately following a meal.    . nitroGLYCERIN (NITROSTAT) 0.4 MG SL tablet Place 1 tablet (0.4 mg total) under the tongue every 5 (five) minutes as needed for chest pain. 25 tablet 12  . simvastatin (ZOCOR) 10 MG tablet Take 10 mg by mouth daily.     . vitamin E 400 UNIT capsule Take 400 Units by mouth daily.     No current facility-administered medications for this visit.    Allergies:   Bactrim    Social History:  The patient  reports that she has been smoking Cigarettes.  She started smoking about 47 years ago. She has a 48 pack-year smoking history. She has never used smokeless tobacco. She reports that she does not drink alcohol or use illicit drugs.   Family History:  The patient's family history includes Heart disease in her father and mother.    ROS:  Please see the history of present illness.   Otherwise, review of systems are positive for Hearing loss, cough, abdominal pain, diarrhea, and excessive fatigue..   All other systems are reviewed and negative.    PHYSICAL EXAM: VS:  BP 116/68 mmHg  Pulse 65  Ht 5' 0.5" (1.537 m)  Wt 127 lb (57.607 kg)  BMI 24.39 kg/m2 , BMI Body mass index is 24.39 kg/(m^2). GEN: Well nourished, well developed, in no acute distress HEENT: normal Neck: no JVD, carotid bruits, or masses Cardiac: RRR.  There is no murmur, rub, or gallop. There is no edema. Respiratory:   clear to auscultation bilaterally, normal work of breathing. GI: soft, nontender, nondistended, + BS MS: no deformity or atrophy Skin: warm and dry, no rash Neuro:  Strength and sensation are intact Psych: euthymic mood, full affect   EKG:  EKG is not ordered today.    Recent Labs: 03/19/2015: ALT 18; BUN 12; Creatinine, Ser 0.67; HGB 14.6; Platelets 225; Potassium, Ser 5.4*; Sodium 137    Lipid Panel    Component Value Date/Time   CHOL 202* 09/16/2014 1322   TRIG 184* 09/16/2014 1322   HDL 40* 09/16/2014 1322   CHOLHDL 5.1* 09/16/2014 1322   VLDL 37* 09/16/2014 1322   LDLCALC 125 09/16/2014 1322      Wt Readings from Last 3 Encounters:  05/04/15 127 lb (57.607 kg)  03/22/15 127 lb (57.607 kg)  03/19/15 127 lb (57.607 kg)      Other studies Reviewed: Additional studies/ records that were reviewed today include: Reviewed the last carotid Doppler. Reviewed he myocardial perfusion study.. The findings include note recent right rectus muscle spontaneous hematoma possibly related to coughing.    ASSESSMENT AND PLAN:  1. Coronary artery disease involving native coronary artery of native heart without angina pectoris Low risk myocardial perfusion study within the past 2 months. Chest discomfort is likely musculoskeletal.  2. Essential hypertension Well controlled.  3. Carotid stenosis, unspecified laterality Carotid Doppler follow-up is scheduled for July 2017  4. Tobacco abuse Unable to commit to smoking cessation  5. Polycythemia vera ([place]) Borderline elevated hemoglobin and need for phlebotomy.    Current medicines are reviewed at length with the patient today.  The patient has the following concerns regarding medicines: No change in current regimen.  The following changes/actions have been instituted:    Aerobic activity is encouraged  Smoking cessation is encouraged  Bilateral carotid Doppler in July  Labs/ tests ordered today include:  No orders of  the defined types were placed in this encounter.     Disposition:   FU with HS in 1 year  Signed, Sinclair Grooms, MD  05/04/2015 12:37 PM    [address], [place], [place]  [address] Phone: [phone]; Fax: [phone]

## 2015-05-05 ENCOUNTER — Ambulatory Visit
Admission: RE | Admit: 2015-05-05 | Discharge: 2015-05-05 | Disposition: A | Discharge status: Home / Self Care | Payer: 59 | Source: Ambulatory Visit

## 2015-05-05 DIAGNOSIS — Z1231 Encounter for screening mammogram for malignant neoplasm of breast: Secondary | ICD-10-CM

## 2015-05-21 ENCOUNTER — Ambulatory Visit (HOSPITAL_BASED_OUTPATIENT_CLINIC_OR_DEPARTMENT_OTHER): Payer: 59 | Admitting: Family

## 2015-05-21 ENCOUNTER — Telehealth: Payer: Self-pay | Admitting: *Deleted

## 2015-05-21 ENCOUNTER — Encounter: Payer: Self-pay | Admitting: Family

## 2015-05-21 ENCOUNTER — Other Ambulatory Visit (HOSPITAL_BASED_OUTPATIENT_CLINIC_OR_DEPARTMENT_OTHER): Payer: 59

## 2015-05-21 VITALS — BP 152/63 | HR 61 | Temp 98.2°F | Resp 16 | Ht 60.0 in | Wt 127.0 lb

## 2015-05-21 DIAGNOSIS — D751 Secondary polycythemia: Secondary | ICD-10-CM

## 2015-05-21 LAB — COMPREHENSIVE METABOLIC PANEL
ALT: 14 U/L (ref 0–55)
AST: 25 U/L (ref 5–34)
Albumin: 3.7 g/dL (ref 3.5–5.0)
Alkaline Phosphatase: 80 U/L (ref 40–150)
Anion Gap: 11 mEq/L (ref 3–11)
BUN: 16.8 mg/dL (ref 7.0–26.0)
CHLORIDE: 101 meq/L (ref 98–109)
CO2: 30 meq/L — AB (ref 22–29)
CREATININE: 0.9 mg/dL (ref 0.6–1.1)
Calcium: 10.3 mg/dL (ref 8.4–10.4)
EGFR: 67 mL/min/{1.73_m2} — ABNORMAL LOW (ref >90)
Glucose: 106 mg/dl (ref 70–140)
POTASSIUM: 5.5 meq/L — AB (ref 3.5–5.1)
Sodium: 142 mEq/L (ref 136–145)
Total Bilirubin: 0.66 mg/dL (ref 0.20–1.20)
Total Protein: 7.3 g/dL (ref 6.4–8.3)

## 2015-05-21 LAB — CBC WITH DIFFERENTIAL (CANCER CENTER ONLY)
BASO#: 0 10*3/uL (ref 0.0–0.2)
BASO%: 0.3 % (ref 0.0–2.0)
EOS%: 0.9 % (ref 0.0–7.0)
Eosinophils Absolute: 0.1 10*3/uL (ref 0.0–0.5)
HCT: 47 % — ABNORMAL HIGH (ref 34.8–46.6)
HGB: 15.9 g/dL (ref 11.6–15.9)
LYMPH#: 1.9 10*3/uL (ref 0.9–3.3)
LYMPH%: 24.2 % (ref 14.0–48.0)
MCH: 30.8 pg (ref 26.0–34.0)
MCHC: 33.8 g/dL (ref 32.0–36.0)
MCV: 91 fL (ref 81–101)
MONO#: 0.9 10*3/uL (ref 0.1–0.9)
MONO%: 11.2 % (ref 0.0–13.0)
NEUT#: 5 10*3/uL (ref 1.5–6.5)
NEUT%: 63.4 % (ref 39.6–80.0)
PLATELETS: 218 10*3/uL (ref 145–400)
RBC: 5.17 10*6/uL (ref 3.70–5.32)
RDW: 12.9 % (ref 11.1–15.7)
WBC: 7.9 10*3/uL (ref 3.9–10.0)

## 2015-05-21 NOTE — Telephone Encounter (Signed)
Per scheduler at [facility] office, patient request her phlebotomy done in [place] office. Appt made and patient aware.

## 2015-05-21 NOTE — Progress Notes (Signed)
Hematology and Oncology Follow Up Visit  [name] [id number] 05-26-1950 65 y.o. 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 She is feeling fatigued and having joint aches and pains. Her last phlebotomy was in March 2016. She prefers to go to [facility] for hers and have then done by Jac Canavan, RN.  She is still smoking 1 ppd and keeps a dry cough. She denies SOB.  No fever, chills, n/v, rash, dizziness, chest pain, palpitations, abdominal pain or changes in bowel or bladder habits.  She has had no more episodes of bleeding while on Plavix.  Her appetite still comes and goes. She is staying well hydrated. Her weight is stable.  She is still under a lot of stress at home with work and caring for her 46 yo mother.  Medications:    Medication List       This list is accurate as of: 05/21/15 11:39 AM.  Always use your most recent med list.               acetaminophen 500 MG tablet  Commonly known as:  TYLENOL  Take 500-1,000 mg by mouth every 6 (six) hours as needed for mild pain or headache.     albuterol 108 (90 Base) MCG/ACT inhaler  Commonly known as:  PROVENTIL HFA;VENTOLIN HFA  Inhale 2 puffs into the lungs every 6 (six) hours as needed for wheezing or shortness of breath.     ALPRAZolam 0.5 MG tablet  Commonly known as:  XANAX  Take 0.5 mg by mouth at bedtime.     clopidogrel 75 MG tablet  Commonly known as:  PLAVIX  Take 1 tablet (75 mg total) by mouth daily.     clotrimazole-betamethasone cream  Commonly known as:  LOTRISONE  use 1 application to affected area as needed for sore areas on body     escitalopram 10 MG tablet  Commonly known as:  LEXAPRO  Take 10 mg by mouth daily.     hydrochlorothiazide 12.5 MG capsule  Commonly known  as:  MICROZIDE  Take 1 capsule (12.5 mg total) by mouth daily.     lansoprazole 30 MG capsule  Commonly known as:  PREVACID  Take 30 mg by mouth daily as needed (for ulcer/acid reflex). Ulcer/ acid reflux     metoprolol succinate 100 MG 24 hr tablet  Commonly known as:  TOPROL-XL  Take 150 mg by mouth daily. Take with or immediately following a meal.     nitroGLYCERIN 0.4 MG SL tablet  Commonly known as:  NITROSTAT  Place 1 tablet (0.4 mg total) under the tongue every 5 (five) minutes as needed for chest pain.     simvastatin 10 MG tablet  Commonly known as:  ZOCOR  Take 10 mg by mouth daily.     VITAMIN B 12 PO  Take 1 tablet by mouth daily.     vitamin E 400 UNIT capsule  Take 400 Units by mouth daily.        Allergies:  Allergies  Allergen Reactions  . Bactrim [Sulfamethoxazole-Trimethoprim] Nausea And Vomiting    Past Medical History, Surgical history, Social history, and Family History were reviewed and updated.  Review of Systems: All other 10 point review of systems is negative.  Physical Exam:  height is 5' (1.524 m) and weight is 127 lb (57.607 kg). Her oral temperature is 98.2 F (36.8 C). Her blood pressure is 152/63 and her pulse is 61. Her respiration is 16.   Wt Readings from Last 3 Encounters:  05/21/15 127 lb (57.607 kg)  05/04/15 127 lb (57.607 kg)  03/22/15 127 lb (57.607 kg)    Ocular: Sclerae unicteric, pupils equal, round and reactive to light Ear-nose-throat: Oropharynx clear, dentition fair Lymphatic: No cervical supraclavicular or axillary adenopathy Lungs no rales or rhonchi, good excursion bilaterally Heart regular rate and rhythm, no murmur appreciated Abd soft, nontender, positive bowel sounds, no liver or spleen tip palpated on exam, no fluid wave  MSK no focal spinal tenderness, no joint edema Neuro: non-focal, well-oriented, appropriate affect Breasts: Deferred  Lab Results  Component Value Date   WBC 7.9 05/21/2015   HGB  15.9 05/21/2015   HCT 47.0* 05/21/2015   MCV 91 05/21/2015   PLT 218 05/21/2015   Lab Results  Component Value Date   FERRITIN 13 09/16/2014   IRON 37* 09/16/2014   TIBC 454* 09/16/2014   UIBC 417* 09/16/2014   IRONPCTSAT 8* 09/16/2014   Lab Results  Component Value Date   RETICCTPCT 1.2 08/03/2010   RBC 5.17 05/21/2015   RETICCTABS 61.3 08/03/2010   No results found for: KPAFRELGTCHN, LAMBDASER, KAPLAMBRATIO No results found for: IGGSERUM, IGA, IGMSERUM No results found for: Odetta Pink, SPEI   Chemistry      Component Value Date/Time   NA 137 03/19/2015 1456   NA 136 03/01/2015 0745   NA 138 09/16/2014 1149   NA 140 06/17/2014 1036   K 5.4* 03/19/2015 1456   K 4.0 03/01/2015 0745   K 3.9 09/16/2014 1149   K 3.9 06/17/2014 1036   CL 100 03/19/2015 1456   CL 102 03/01/2015 0745   CL 100 06/17/2014 1036   CO2 28 03/19/2015 1456   CO2 24 03/01/2015 0745   CO2 26 09/16/2014 1149   CO2 29 06/17/2014 1036   BUN 12 03/19/2015 1456   BUN 21* 03/01/2015 0745   BUN 14.0 09/16/2014 1149   BUN 15 06/17/2014 1036   CREATININE 0.67 03/19/2015 1456   CREATININE 0.72 03/01/2015 0745   CREATININE 0.8 09/16/2014 1149   CREATININE 1.0 06/17/2014 1036      Component Value Date/Time   CALCIUM 9.5 03/19/2015 1456   CALCIUM 8.6* 03/01/2015 0745   CALCIUM 9.2 09/16/2014 1149   CALCIUM 9.5 06/17/2014 1036   ALKPHOS 91 03/19/2015 1456   ALKPHOS 66 03/01/2015 0745   ALKPHOS 99 09/16/2014 1149   ALKPHOS 83 06/17/2014 1036   AST 26 03/19/2015 1456   AST 20 03/01/2015 0745   AST 20 09/16/2014 1149   AST 30 06/17/2014 1036   ALT 18 03/19/2015 1456   ALT 19 03/01/2015 0745   ALT 10 09/16/2014 1149   ALT 18 06/17/2014 1036   BILITOT 0.7 03/19/2015 1456   BILITOT 0.6 03/01/2015 0745   BILITOT 0.40 09/16/2014 1149   BILITOT 0.70 06/17/2014 1036     Impression and Plan: [name] is a 65 yo white female with polycythemia.  She is symptomatic at this time with fatigue and joint pain. Her Hct is now 47%.  We will schedule her for a phlebotomy next week at [facility] per her request. I have reached out to Jac Canavan via e-mail to see if she can assist with this.  We  will plan to see her back in 2 months for lab work and follow-up.  She will contact us with any questions or concerns. We can certainly see her sooner if need be.   Eliezer Bottom, NP 3/31/201711:39 AM

## 2015-05-28 ENCOUNTER — Ambulatory Visit (HOSPITAL_BASED_OUTPATIENT_CLINIC_OR_DEPARTMENT_OTHER): Payer: 59

## 2015-05-28 ENCOUNTER — Other Ambulatory Visit: Payer: Self-pay

## 2015-05-28 VITALS — BP 113/60 | HR 64 | Temp 98.2°F | Resp 18

## 2015-05-28 DIAGNOSIS — D45 Polycythemia vera: Secondary | ICD-10-CM

## 2015-05-28 DIAGNOSIS — D751 Secondary polycythemia: Secondary | ICD-10-CM

## 2015-05-28 MED ORDER — SODIUM CHLORIDE 0.9 % IV SOLN: Freq: Once | INTRAVENOUS | Status: DC

## 2015-05-28 NOTE — Progress Notes (Signed)
Phlebotomy performed from 1130-1210.  533mls removed.  Five attempts made at access vein before blood return noted in right antecubital.  Pt reports sometimes fainting during procedure so orders obtained from Dr Marin Olp for 500 mls of NS over 30 minutes if needed.  Pt tolerated procedure once access obtained without difficulty and refused IVFs.  Patient ate and drank while present and observed for 35minutes post procedure.  Patient discharged with normal VS and not complaints.

## 2015-05-28 NOTE — Patient Instructions (Signed)
Therapeutic Phlebotomy Therapeutic phlebotomy is the controlled removal of blood from a person's body for the purpose of treating a medical condition. The procedure is similar to donating blood. Usually, about a pint (470 mL, or 0.47L) of blood is removed. The average adult has 9-12 pints (4.3-5.7 L) of blood. Therapeutic phlebotomy may be used to treat the following medical conditions:  Hemochromatosis. This is a condition in which the blood contains too much iron.  Polycythemia vera. This is a condition in which the blood contains too many red blood cells.  Porphyria cutanea tarda. This is a disease in which an important part of hemoglobin is not made properly. It results in the buildup of abnormal amounts of porphyrins in the body.  Sickle cell disease. This is a condition in which the red blood cells form an abnormal crescent shape rather than a round shape. LET [facility] CARE PROVIDER KNOW ABOUT:  Any allergies you have.  All medicines you are taking, including vitamins, herbs, eye drops, creams, and over-the-counter medicines.  Previous problems you or members of your family have had with the use of anesthetics.  Any blood disorders you have.  Previous surgeries you have had.  Any medical conditions you may have. RISKS AND COMPLICATIONS Generally, this is a safe procedure. However, problems may occur, including:  Nausea or light-headedness.  Low blood pressure.  Soreness, bleeding, swelling, or bruising at the needle insertion site.  Infection. BEFORE THE PROCEDURE  Follow instructions from your health care provider about eating or drinking restrictions.  Ask your health care provider about changing or stopping your regular medicines. This is especially important if you are taking diabetes medicines or blood thinners.  Wear clothing with sleeves that can be raised above the elbow.  Plan to have someone take you home after the procedure.  You may have a blood sample  taken. PROCEDURE  A needle will be inserted into one of your veins.  Tubing and a collection bag will be attached to that needle.  Blood will flow through the needle and tubing into the collection bag.  You may be asked to open and close your hand slowly and continually during the entire collection.  After the specified amount of blood has been removed from your body, the collection bag and tubing will be clamped.  The needle will be removed from your vein.  Pressure will be held on the site of the needle insertion to stop the bleeding.  A bandage (dressing) will be placed over the needle insertion site. The procedure may vary among health care providers and hospitals. AFTER THE PROCEDURE  Your recovery will be assessed and monitored.  You can return to your normal activities as directed by your health care provider.   This information is not intended to replace advice given to you by your health care provider. Make sure you discuss any questions you have with your health care provider.   Document Released: 07/11/2010 Document Revised: 06/23/2014 Document Reviewed: 02/02/2014 Elsevier Interactive Patient Education 2016 [place] is a condition in which the body makes too many red blood cells and there is no known cause. The red blood cells (erythrocytes) are the cells which carry the oxygen in your blood stream to the cells of your body. Because of the increased red blood cells, the blood becomes thicker and does not circulate as well. It would be similar to your car having oil which is too thick so it cannot start and  circulate as well. When the blood is too thick it often causes headaches and dizziness. It may also cause blood clots. Even though the blood clots easier, these patients bleed easier. The bleeding is caused because the blood cells which help stop bleeding (platelets) do not function normally. It occurs in all age groups but is  more common in the 68 to 67 year age range. TREATMENT  The treatment of polycythemia vera for many years has been blood removal (phlebotomy) which is similar to blood removal in a blood bank, however this blood is not used for donation. Hydroxyurea is used to supplement phlebotomy. Aspirin is commonly given to thin the blood as long as the patient does not have a problem with bleeding. Other drugs are used based on the progression of the disease.   This information is not intended to replace advice given to you by your health care provider. Make sure you discuss any questions you have with your health care provider.   Document Released: 11/01/2000 Document Revised: 02/27/2014 Document Reviewed: 08/19/2014 Elsevier Interactive Patient Education [employer].

## 2015-06-22 ENCOUNTER — Other Ambulatory Visit: Payer: Self-pay | Admitting: Interventional Cardiology

## 2015-07-23 ENCOUNTER — Ambulatory Visit: Payer: 59 | Admitting: Family

## 2015-07-23 ENCOUNTER — Other Ambulatory Visit: Payer: 59

## 2015-08-05 ENCOUNTER — Telehealth: Payer: Self-pay | Admitting: Hematology & Oncology

## 2015-08-05 NOTE — Telephone Encounter (Signed)
Patient called and resch 07/23/15 missed apt for 08/19/15

## 2015-08-19 ENCOUNTER — Ambulatory Visit: Payer: 59 | Admitting: Family

## 2015-08-19 ENCOUNTER — Other Ambulatory Visit: Payer: 59

## 2015-08-19 ENCOUNTER — Telehealth: Payer: Self-pay | Admitting: Hematology & Oncology

## 2015-08-19 NOTE — Telephone Encounter (Signed)
Patient called and cx apt today and resch for 09/16/15.  Roselyn Reef was notified of cx apt

## 2015-08-30 ENCOUNTER — Ambulatory Visit (HOSPITAL_COMMUNITY): Admission: RE | Admit: 2015-08-30 | Payer: 59 | Source: Ambulatory Visit

## 2015-09-06 ENCOUNTER — Encounter (HOSPITAL_COMMUNITY): Payer: 59

## 2015-09-16 ENCOUNTER — Other Ambulatory Visit: Payer: 59

## 2015-09-16 ENCOUNTER — Ambulatory Visit: Payer: 59 | Admitting: Family

## 2015-09-27 ENCOUNTER — Other Ambulatory Visit (HOSPITAL_BASED_OUTPATIENT_CLINIC_OR_DEPARTMENT_OTHER): Payer: PPO

## 2015-09-27 ENCOUNTER — Encounter: Payer: Self-pay | Admitting: Family

## 2015-09-27 ENCOUNTER — Ambulatory Visit (HOSPITAL_BASED_OUTPATIENT_CLINIC_OR_DEPARTMENT_OTHER): Payer: PPO | Admitting: Family

## 2015-09-27 VITALS — BP 143/56 | HR 64 | Temp 98.0°F | Resp 20 | Ht 60.0 in | Wt 127.0 lb

## 2015-09-27 DIAGNOSIS — Z720 Tobacco use: Secondary | ICD-10-CM

## 2015-09-27 DIAGNOSIS — D751 Secondary polycythemia: Secondary | ICD-10-CM

## 2015-09-27 LAB — COMPREHENSIVE METABOLIC PANEL
ALBUMIN: 3.8 g/dL (ref 3.5–5.0)
ALK PHOS: 84 U/L (ref 40–150)
ALT: 13 U/L (ref 0–55)
ANION GAP: 10 meq/L (ref 3–11)
AST: 21 U/L (ref 5–34)
BILIRUBIN TOTAL: 0.55 mg/dL (ref 0.20–1.20)
BUN: 9.1 mg/dL (ref 7.0–26.0)
CALCIUM: 9.7 mg/dL (ref 8.4–10.4)
CHLORIDE: 102 meq/L (ref 98–109)
CO2: 28 mEq/L (ref 22–29)
CREATININE: 0.9 mg/dL (ref 0.6–1.1)
EGFR: 66 mL/min/{1.73_m2} — ABNORMAL LOW (ref >90)
Glucose: 112 mg/dl (ref 70–140)
Potassium: 5 mEq/L (ref 3.5–5.1)
Sodium: 140 mEq/L (ref 136–145)
TOTAL PROTEIN: 7.3 g/dL (ref 6.4–8.3)

## 2015-09-27 LAB — CBC WITH DIFFERENTIAL (CANCER CENTER ONLY)
BASO#: 0 10*3/uL (ref 0.0–0.2)
BASO%: 0.1 % (ref 0.0–2.0)
EOS%: 0.6 % (ref 0.0–7.0)
Eosinophils Absolute: 0.1 10*3/uL (ref 0.0–0.5)
HEMATOCRIT: 42.7 % (ref 34.8–46.6)
HEMOGLOBIN: 13.9 g/dL (ref 11.6–15.9)
LYMPH#: 1.6 10*3/uL (ref 0.9–3.3)
LYMPH%: 20.5 % (ref 14.0–48.0)
MCH: 28.6 pg (ref 26.0–34.0)
MCHC: 32.6 g/dL (ref 32.0–36.0)
MCV: 88 fL (ref 81–101)
MONO#: 0.8 10*3/uL (ref 0.1–0.9)
MONO%: 10.5 % (ref 0.0–13.0)
NEUT#: 5.3 10*3/uL (ref 1.5–6.5)
NEUT%: 68.3 % (ref 39.6–80.0)
Platelets: 238 10*3/uL (ref 145–400)
RBC: 4.86 10*6/uL (ref 3.70–5.32)
RDW: 15.7 % (ref 11.1–15.7)
WBC: 7.7 10*3/uL (ref 3.9–10.0)

## 2015-09-27 NOTE — Progress Notes (Signed)
Hematology and Oncology Follow Up Visit  [name] [id number] 09-Jun-1950 65 y.o. 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 She is doing well but under some stress taking care of her 23 year old mother. She retired in May and has moved in to help with her care.  She just returned from the beach with her family. They had a great time.  Her last phlebotomy was in April and she had a nice response. Her Hct today is 42.7.  No fever, chills, n/v, rash, dizziness, SOB, chest pain, palpitations, abdominal pain or changes in  bladder habits. She has been having diarrhea off and on and is unable to identify a cause. No medication changes or new foods in her diet. She will try fiber wafers and see if this helps.  She is still smoking and keeps a dry cough.  She is on Plavix due to CAd and stent placement. No episodes of bleeding, bruising or petechiae. No lymphadenopathy found on exam.  The numbness and tingling in her hands and feet is unchanged. No swelling or tenderness in her extremities. No new aches or pains.  Her appetite comes and goes. She is staying well hydrated. Her weight is unchanged.   Medications:    Medication List       Accurate as of 09/27/15  1:43 PM. Always use your most recent med list.          acetaminophen 500 MG tablet Commonly known as:  TYLENOL Take 500-1,000 mg by mouth every 6 (six) hours as needed for mild pain or headache.   albuterol 108 (90 Base) MCG/ACT inhaler Commonly known as:  PROVENTIL HFA;VENTOLIN HFA Inhale 2 puffs into the lungs every 6 (six) hours as needed for wheezing or shortness of breath.   ALPRAZolam 0.5 MG  tablet Commonly known as:  XANAX Take 0.5 mg by mouth at bedtime.   clopidogrel 75 MG tablet Commonly known as:  PLAVIX Take 1 tablet (75 mg total) by mouth daily.   clotrimazole-betamethasone cream Commonly known as:  LOTRISONE use 1 application to affected area as needed for sore areas on body   escitalopram 10 MG tablet Commonly known as:  LEXAPRO Take 10 mg by mouth daily.   hydrochlorothiazide 12.5 MG capsule Commonly known as:  MICROZIDE Take 1 capsule (12.5 mg total) by mouth daily.   lansoprazole 30 MG capsule Commonly known as:  PREVACID Take 30 mg by mouth daily as needed (for ulcer/acid reflex). Ulcer/ acid reflux   metoprolol succinate 100 MG 24 hr tablet Commonly known as:  TOPROL-XL Take 150 mg by mouth daily. Take with or immediately following a meal.   metoprolol succinate 100 MG 24 hr tablet Commonly known as:  TOPROL-XL TAKE ONE & ONE-HALF TABLETS BY MOUTH ONCE DAILY   nitroGLYCERIN 0.4 MG SL tablet Commonly known as:  NITROSTAT Place 1 tablet (0.4 mg total) under the tongue every 5 (five) minutes as needed for chest pain.   simvastatin 10 MG tablet Commonly known as:  ZOCOR Take 10 mg by mouth daily.   VITAMIN B 12 PO Take 1 tablet by mouth daily.   vitamin E  400 UNIT capsule Take 400 Units by mouth daily.       Allergies:  Allergies  Allergen Reactions  . Bactrim [Sulfamethoxazole-Trimethoprim] Nausea And Vomiting    Past Medical History, Surgical history, Social history, and Family History were reviewed and updated.  Review of Systems: All other 10 point review of systems is negative.   Physical Exam:  vitals were not taken for this visit.  Wt Readings from Last 3 Encounters:  05/21/15 127 lb (57.6 kg)  05/04/15 127 lb (57.6 kg)  03/22/15 127 lb (57.6 kg)    Ocular: Sclerae unicteric, pupils equal, round and reactive to light Ear-nose-throat: Oropharynx clear, dentition fair Lymphatic: No cervical supraclavicular or axillary  adenopathy Lungs no rales or rhonchi, good excursion bilaterally Heart regular rate and rhythm, no murmur appreciated Abd soft, nontender, positive bowel sounds, no liver or spleen tip palpated on exam, no fluid wave  MSK no focal spinal tenderness, no joint edema Neuro: non-focal, well-oriented, appropriate affect Breasts: Deferred  Lab Results  Component Value Date   WBC 7.7 09/27/2015   HGB 13.9 09/27/2015   HCT 42.7 09/27/2015   MCV 88 09/27/2015   PLT 238 09/27/2015   Lab Results  Component Value Date   FERRITIN 13 09/16/2014   IRON 37 (L) 09/16/2014   TIBC 454 (H) 09/16/2014   UIBC 417 (H) 09/16/2014   IRONPCTSAT 8 (L) 09/16/2014   Lab Results  Component Value Date   RETICCTPCT 1.2 08/03/2010   RBC 4.86 09/27/2015   RETICCTABS 61.3 08/03/2010   No results found for: KPAFRELGTCHN, LAMBDASER, KAPLAMBRATIO No results found for: IGGSERUM, IGA, IGMSERUM No results found for: Odetta Pink, SPEI   Chemistry      Component Value Date/Time   NA 142 05/21/2015 1120   K 5.5 (H) 05/21/2015 1120   CL 100 03/19/2015 1456   CL 100 06/17/2014 1036   CO2 30 (H) 05/21/2015 1120   BUN 16.8 05/21/2015 1120   CREATININE 0.9 05/21/2015 1120      Component Value Date/Time   CALCIUM 10.3 05/21/2015 1120   ALKPHOS 80 05/21/2015 1120   AST 25 05/21/2015 1120   ALT 14 05/21/2015 1120   BILITOT 0.66 05/21/2015 1120     Impression and Plan: [name] is a 65 yo white female with polycythemia. She has responded nicely to phlebotomy. Her Hct at this time is 42.7 so she will not need a phlebotomy at this time.   We will plan to see her back in 2 months for lab work and follow-up.  She will contact us with any questions or concerns. We can certainly see her sooner if need be.   Amber Bottom, NP 8/7/20171:43 PM

## 2015-09-28 ENCOUNTER — Ambulatory Visit (HOSPITAL_COMMUNITY)
Admission: RE | Admit: 2015-09-28 | Discharge: 2015-09-28 | Disposition: A | Discharge status: Home / Self Care | Payer: PPO | Source: Ambulatory Visit | Attending: Cardiovascular Disease | Admitting: Cardiovascular Disease

## 2015-09-28 DIAGNOSIS — I10 Essential (primary) hypertension: Secondary | ICD-10-CM | POA: Insufficient documentation

## 2015-09-28 DIAGNOSIS — K219 Gastro-esophageal reflux disease without esophagitis: Secondary | ICD-10-CM | POA: Insufficient documentation

## 2015-09-28 DIAGNOSIS — I6523 Occlusion and stenosis of bilateral carotid arteries: Secondary | ICD-10-CM | POA: Diagnosis not present

## 2015-09-28 DIAGNOSIS — I2510 Atherosclerotic heart disease of native coronary artery without angina pectoris: Secondary | ICD-10-CM | POA: Diagnosis not present

## 2015-09-30 ENCOUNTER — Other Ambulatory Visit: Payer: Self-pay | Admitting: Interventional Cardiology

## 2015-10-01 ENCOUNTER — Other Ambulatory Visit: Payer: Self-pay

## 2015-10-01 DIAGNOSIS — I6523 Occlusion and stenosis of bilateral carotid arteries: Secondary | ICD-10-CM

## 2015-12-01 DIAGNOSIS — F322 Major depressive disorder, single episode, severe without psychotic features: Secondary | ICD-10-CM | POA: Diagnosis not present

## 2015-12-01 DIAGNOSIS — I10 Essential (primary) hypertension: Secondary | ICD-10-CM | POA: Diagnosis not present

## 2015-12-01 DIAGNOSIS — I2510 Atherosclerotic heart disease of native coronary artery without angina pectoris: Secondary | ICD-10-CM | POA: Diagnosis not present

## 2015-12-01 DIAGNOSIS — J449 Chronic obstructive pulmonary disease, unspecified: Secondary | ICD-10-CM | POA: Diagnosis not present

## 2015-12-01 DIAGNOSIS — E782 Mixed hyperlipidemia: Secondary | ICD-10-CM | POA: Diagnosis not present

## 2015-12-01 DIAGNOSIS — Z23 Encounter for immunization: Secondary | ICD-10-CM | POA: Diagnosis not present

## 2015-12-01 DIAGNOSIS — R7309 Other abnormal glucose: Secondary | ICD-10-CM | POA: Diagnosis not present

## 2015-12-01 DIAGNOSIS — D45 Polycythemia vera: Secondary | ICD-10-CM | POA: Diagnosis not present

## 2015-12-02 ENCOUNTER — Other Ambulatory Visit: Payer: Self-pay | Admitting: Internal Medicine

## 2015-12-02 ENCOUNTER — Ambulatory Visit
Admission: RE | Admit: 2015-12-02 | Discharge: 2015-12-02 | Disposition: A | Discharge status: Home / Self Care | Payer: PPO | Source: Ambulatory Visit | Attending: Internal Medicine | Admitting: Internal Medicine

## 2015-12-02 DIAGNOSIS — R0602 Shortness of breath: Secondary | ICD-10-CM | POA: Diagnosis not present

## 2015-12-02 DIAGNOSIS — J449 Chronic obstructive pulmonary disease, unspecified: Secondary | ICD-10-CM

## 2015-12-03 DIAGNOSIS — R739 Hyperglycemia, unspecified: Secondary | ICD-10-CM | POA: Diagnosis not present

## 2015-12-03 DIAGNOSIS — E782 Mixed hyperlipidemia: Secondary | ICD-10-CM | POA: Diagnosis not present

## 2015-12-28 ENCOUNTER — Ambulatory Visit (HOSPITAL_BASED_OUTPATIENT_CLINIC_OR_DEPARTMENT_OTHER): Payer: PPO | Admitting: Family

## 2015-12-28 ENCOUNTER — Other Ambulatory Visit (HOSPITAL_BASED_OUTPATIENT_CLINIC_OR_DEPARTMENT_OTHER): Payer: PPO

## 2015-12-28 VITALS — BP 159/81 | HR 56 | Temp 97.6°F | Resp 20 | Wt 123.8 lb

## 2015-12-28 DIAGNOSIS — D751 Secondary polycythemia: Secondary | ICD-10-CM

## 2015-12-28 DIAGNOSIS — Z720 Tobacco use: Secondary | ICD-10-CM

## 2015-12-28 LAB — CBC WITH DIFFERENTIAL (CANCER CENTER ONLY)
BASO#: 0 10*3/uL (ref 0.0–0.2)
BASO%: 0.3 % (ref 0.0–2.0)
EOS%: 1.1 % (ref 0.0–7.0)
Eosinophils Absolute: 0.1 10*3/uL (ref 0.0–0.5)
HCT: 45.7 % (ref 34.8–46.6)
HGB: 15.3 g/dL (ref 11.6–15.9)
LYMPH#: 1.8 10*3/uL (ref 0.9–3.3)
LYMPH%: 27.4 % (ref 14.0–48.0)
MCH: 29.8 pg (ref 26.0–34.0)
MCHC: 33.5 g/dL (ref 32.0–36.0)
MCV: 89 fL (ref 81–101)
MONO#: 0.8 10*3/uL (ref 0.1–0.9)
MONO%: 11.3 % (ref 0.0–13.0)
NEUT%: 59.9 % (ref 39.6–80.0)
NEUTROS ABS: 4 10*3/uL (ref 1.5–6.5)
PLATELETS: 233 10*3/uL (ref 145–400)
RBC: 5.14 10*6/uL (ref 3.70–5.32)
RDW: 14.8 % (ref 11.1–15.7)
WBC: 6.6 10*3/uL (ref 3.9–10.0)

## 2015-12-28 LAB — COMPREHENSIVE METABOLIC PANEL
ALT: 13 U/L (ref 0–55)
AST: 25 U/L (ref 5–34)
Albumin: 3.7 g/dL (ref 3.5–5.0)
Alkaline Phosphatase: 83 U/L (ref 40–150)
Anion Gap: 10 mEq/L (ref 3–11)
BUN: 10.4 mg/dL (ref 7.0–26.0)
CHLORIDE: 99 meq/L (ref 98–109)
CO2: 28 mEq/L (ref 22–29)
Calcium: 9.6 mg/dL (ref 8.4–10.4)
Creatinine: 0.9 mg/dL (ref 0.6–1.1)
EGFR: 70 mL/min/{1.73_m2} — ABNORMAL LOW (ref >90)
GLUCOSE: 124 mg/dL (ref 70–140)
POTASSIUM: 4.9 meq/L (ref 3.5–5.1)
SODIUM: 136 meq/L (ref 136–145)
Total Bilirubin: 0.74 mg/dL (ref 0.20–1.20)
Total Protein: 7.2 g/dL (ref 6.4–8.3)

## 2015-12-28 NOTE — Progress Notes (Signed)
Hematology and Oncology Follow Up Visit  [name] [id number] Nov 28, 1950 65 y.o. 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 She is symptomatic with fatigue and dizziness. Her Hct is 45.7 and she states she feels that she needs a phlebotomy.  No fever, chills, n/v, rash, SOB, chest pain, palpitations, abdominal pain or changes in bowel or bladder habits.  She is still smoking and keeps a dry cough. She is not interested in quitting at this time.  She is on Plavix due to CAD and stent placement. No episodes of bleeding, bruising or petechiae. No lymphadenopathy found on exam. She stopped the aspirin when she started Plavix.  The numbness and tingling in her feet is unchanged. No swelling or tenderness in her extremities. No c/o joint pain.  Her appetite still comes and goes. She is staying well hydrated. Her weight is down 4 lbs since her last visit.    Medications:    Medication List       Accurate as of 12/28/15  1:18 PM. Always use your most recent med list.          acetaminophen 500 MG tablet Commonly known as:  TYLENOL Take 500-1,000 mg by mouth every 6 (six) hours as needed for mild pain or headache.   albuterol 108 (90 Base) MCG/ACT inhaler Commonly known as:  PROVENTIL HFA;VENTOLIN HFA Inhale 2 puffs into the lungs every 6 (six) hours as needed for wheezing or shortness of breath.   ALPRAZolam 0.5 MG tablet Commonly known as:  XANAX Take 0.5 mg by mouth at bedtime.   clopidogrel 75 MG tablet Commonly known as:  PLAVIX TAKE ONE TABLET BY MOUTH ONCE DAILY   clotrimazole-betamethasone cream Commonly known as:  LOTRISONE use 1 application to affected area as needed for sore  areas on body   escitalopram 10 MG tablet Commonly known as:  LEXAPRO Take 10 mg by mouth daily.   hydrochlorothiazide 12.5 MG capsule Commonly known as:  MICROZIDE Take 1 capsule (12.5 mg total) by mouth daily.   lansoprazole 30 MG capsule Commonly known as:  PREVACID Take 30 mg by mouth daily as needed (for ulcer/acid reflex). Ulcer/ acid reflux   metoprolol succinate 100 MG 24 hr tablet Commonly known as:  TOPROL-XL Take 150 mg by mouth daily. Take with or immediately following a meal.   nitroGLYCERIN 0.4 MG SL tablet Commonly known as:  NITROSTAT Place 1 tablet (0.4 mg total) under the tongue every 5 (five) minutes as needed for chest pain.   simvastatin 10 MG tablet Commonly known as:  ZOCOR Take 10 mg by mouth daily.   VITAMIN B 12 PO Take 1 tablet by mouth daily.   vitamin E 400 UNIT capsule Take 400 Units by mouth daily.       Allergies:  Allergies  Allergen Reactions  . Bactrim [Sulfamethoxazole-Trimethoprim] Nausea And Vomiting  . Diphenhydramine Hcl Hives    Past Medical History, Surgical history, Social history, and Family History were reviewed and updated.  Review of Systems: All other 10 point review of systems is negative.   Physical Exam:  vitals were not taken for this visit.  Wt Readings from Last 3 Encounters:  09/27/15  127 lb (57.6 kg)  05/21/15 127 lb (57.6 kg)  05/04/15 127 lb (57.6 kg)    Ocular: Sclerae unicteric, pupils equal, round and reactive to light Ear-nose-throat: Oropharynx clear, dentition fair Lymphatic: No cervical supraclavicular or axillary adenopathy Lungs no rales or rhonchi, good excursion bilaterally Heart regular rate and rhythm, no murmur appreciated Abd soft, nontender, positive bowel sounds, no liver or spleen tip palpated on exam, no fluid wave  MSK no focal spinal tenderness, no joint edema Neuro: non-focal, well-oriented, appropriate affect Breasts: Deferred  Lab Results  Component Value Date   WBC  7.7 09/27/2015   HGB 13.9 09/27/2015   HCT 42.7 09/27/2015   MCV 88 09/27/2015   PLT 238 09/27/2015   Lab Results  Component Value Date   FERRITIN 13 09/16/2014   IRON 37 (L) 09/16/2014   TIBC 454 (H) 09/16/2014   UIBC 417 (H) 09/16/2014   IRONPCTSAT 8 (L) 09/16/2014   Lab Results  Component Value Date   RETICCTPCT 1.2 08/03/2010   RBC 4.86 09/27/2015   RETICCTABS 61.3 08/03/2010   No results found for: KPAFRELGTCHN, LAMBDASER, KAPLAMBRATIO No results found for: IGGSERUM, IGA, IGMSERUM No results found for: Kathrynn Ducking, MSPIKE, SPEI   Chemistry      Component Value Date/Time   NA 140 09/27/2015 1310   K 5.0 09/27/2015 1310   CL 100 03/19/2015 1456   CL 100 06/17/2014 1036   CO2 28 09/27/2015 1310   BUN 9.1 09/27/2015 1310   CREATININE 0.9 09/27/2015 1310      Component Value Date/Time   CALCIUM 9.7 09/27/2015 1310   ALKPHOS 84 09/27/2015 1310   AST 21 09/27/2015 1310   ALT 13 09/27/2015 1310   BILITOT 0.55 09/27/2015 1310     Impression and Plan: [name] is a 65 yo white female with polycythemia. She has responded nicely to phlebotomy. Her Hct at this time is 45.7 and she is symptomatic with fatigue and dizziness.  We will get her set up for phlebotomy at [facility] later this week when it is convenient for her. She cares for her mother and also picks up her granddaughter from school so her schedule is a little tight at times. She also prefers that her phlebotomy be done by Amber Fall, RN.  We will plan to see her back in 3 months for repeat lab work and follow-up.  She will contact us with any questions or concerns. We can certainly see her sooner if need be.   Eliezer Bottom, NP 11/7/20171:18 PM

## 2015-12-31 ENCOUNTER — Ambulatory Visit (HOSPITAL_BASED_OUTPATIENT_CLINIC_OR_DEPARTMENT_OTHER): Payer: PPO

## 2015-12-31 DIAGNOSIS — D45 Polycythemia vera: Secondary | ICD-10-CM

## 2015-12-31 NOTE — Patient Instructions (Signed)

## 2015-12-31 NOTE — Progress Notes (Signed)
Phlebotomy started at 1545 and ended at 1600.  18 g catheter using secondary IV set and empty 500 cc IV bag used.  Right AC accessed x one attempt by Tim, RN and obtained 500 grams blood over 15 minutes. PT tolerated well.   Observed for 30 min post procedure and d/c'd in stable condition.

## 2016-02-28 DIAGNOSIS — M6740 Ganglion, unspecified site: Secondary | ICD-10-CM | POA: Diagnosis not present

## 2016-02-28 DIAGNOSIS — I10 Essential (primary) hypertension: Secondary | ICD-10-CM | POA: Diagnosis not present

## 2016-03-11 ENCOUNTER — Other Ambulatory Visit: Payer: Self-pay | Admitting: Interventional Cardiology

## 2016-03-29 ENCOUNTER — Ambulatory Visit (HOSPITAL_BASED_OUTPATIENT_CLINIC_OR_DEPARTMENT_OTHER): Payer: PPO | Admitting: Family

## 2016-03-29 ENCOUNTER — Other Ambulatory Visit (HOSPITAL_BASED_OUTPATIENT_CLINIC_OR_DEPARTMENT_OTHER): Payer: PPO

## 2016-03-29 DIAGNOSIS — D751 Secondary polycythemia: Secondary | ICD-10-CM | POA: Diagnosis not present

## 2016-03-29 LAB — CBC WITH DIFFERENTIAL (CANCER CENTER ONLY)
BASO#: 0 10*3/uL (ref 0.0–0.2)
BASO%: 0.3 % (ref 0.0–2.0)
EOS ABS: 0.1 10*3/uL (ref 0.0–0.5)
EOS%: 1.2 % (ref 0.0–7.0)
HCT: 43.2 % (ref 34.8–46.6)
HEMOGLOBIN: 14 g/dL (ref 11.6–15.9)
LYMPH#: 2.1 10*3/uL (ref 0.9–3.3)
LYMPH%: 31 % (ref 14.0–48.0)
MCH: 29.3 pg (ref 26.0–34.0)
MCHC: 32.4 g/dL (ref 32.0–36.0)
MCV: 90 fL (ref 81–101)
MONO#: 0.8 10*3/uL (ref 0.1–0.9)
MONO%: 12.2 % (ref 0.0–13.0)
NEUT%: 55.3 % (ref 39.6–80.0)
NEUTROS ABS: 3.8 10*3/uL (ref 1.5–6.5)
Platelets: 251 10*3/uL (ref 145–400)
RBC: 4.78 10*6/uL (ref 3.70–5.32)
RDW: 13.1 % (ref 11.1–15.7)
WBC: 6.8 10*3/uL (ref 3.9–10.0)

## 2016-03-29 LAB — COMPREHENSIVE METABOLIC PANEL (CC13)
ALBUMIN: 4 g/dL (ref 3.6–4.8)
ALT: 11 IU/L (ref 0–32)
AST (SGOT): 21 IU/L (ref 0–40)
Albumin/Globulin Ratio: 1.3 (ref 1.2–2.2)
Alkaline Phosphatase, S: 81 IU/L (ref 39–117)
BILIRUBIN TOTAL: 0.4 mg/dL (ref 0.0–1.2)
BUN/Creatinine Ratio: 18 (ref 12–28)
BUN: 14 mg/dL (ref 8–27)
CHLORIDE: 97 mmol/L (ref 96–106)
Calcium, Ser: 9.2 mg/dL (ref 8.7–10.3)
Carbon Dioxide, Total: 27 mmol/L (ref 18–29)
Creatinine, Ser: 0.8 mg/dL (ref 0.57–1.00)
GFR, EST AFRICAN AMERICAN: 89 mL/min/{1.73_m2} (ref >59)
GFR, EST NON AFRICAN AMERICAN: 78 mL/min/{1.73_m2} (ref >59)
Globulin, Total: 3.1 g/dL (ref 1.5–4.5)
Glucose: 106 mg/dL — ABNORMAL HIGH (ref 65–99)
Potassium, Ser: 5.1 mmol/L (ref 3.5–5.2)
Sodium: 133 mmol/L — ABNORMAL LOW (ref 134–144)
TOTAL PROTEIN: 7.1 g/dL (ref 6.0–8.5)

## 2016-03-29 LAB — CHCC SATELLITE - SMEAR

## 2016-03-29 NOTE — Progress Notes (Signed)
Hematology and Oncology Follow Up Visit  [name] [id number] Jul 09, 1950 66 y.o. 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 She is doing well and states that she "does not feel that she needs a phlebotomy." Hct is 43.2.  She stays busy taking care of [name] and states that she is ready for wormer weather so they can get out of the house. She would like to start going to a gym to exercise.  She has had no issue with infections. No fever, chills, n/v, rash, SOB, chest pain, palpitations, abdominal pain or changes in bowel or bladder habits.  She is still smoking < 1 ppd and vapes. She keeps a dry cough. Amber SOB with over exertion is unchanged. She will take time to rest as needed. She is interested I quitting and is checking into the nicotine patch.  She is on Plavix due to CAD and stent placement. No episodes of bleeding, bruising or petechiae. No lymphadenopathy found on exam.  The numbness and tingling in Amber feet is unchanged. No swelling or tenderness in Amber extremities. No c/o joint pain.  Amber appetite still comes and goes and she stays hydrated. Amber weight today is unchanged since Amber last visit.     Medications:  Allergies as of 03/29/2016      Reactions   Bactrim [sulfamethoxazole-trimethoprim] Nausea And Vomiting   Diphenhydramine Hcl Hives      Medication List       Accurate as of 03/29/16  2:25 PM. Always use your most recent med list.          acetaminophen 500 MG tablet Commonly known as:  TYLENOL Take 500-1,000 mg by mouth every 6 (six) hours as needed for mild pain or headache.   albuterol 108 (90 Base) MCG/ACT inhaler Commonly known as:  PROVENTIL HFA;VENTOLIN  HFA Inhale 2 puffs into the lungs every 6 (six) hours as needed for wheezing or shortness of breath.   ALPRAZolam 0.5 MG tablet Commonly known as:  XANAX Take 0.5 mg by mouth at bedtime.   clopidogrel 75 MG tablet Commonly known as:  PLAVIX TAKE ONE TABLET BY MOUTH ONCE DAILY   clotrimazole-betamethasone cream Commonly known as:  LOTRISONE use 1 application to affected area as needed for sore areas on body   escitalopram 20 MG tablet Commonly known as:  LEXAPRO Take 20 mg by mouth daily.   hydrochlorothiazide 12.5 MG capsule Commonly known as:  MICROZIDE Take 1 capsule (12.5 mg total) by mouth daily. *Please call and schedule a one year follow up appointment*   lansoprazole 30 MG capsule Commonly known as:  PREVACID Take 30 mg by mouth daily as needed (for ulcer/acid reflex). Ulcer/ acid reflux   metoprolol succinate 100 MG 24 hr tablet Commonly known as:  TOPROL-XL Take 150 mg by mouth daily. Take with or immediately following a meal.   nitroGLYCERIN 0.4 MG SL tablet Commonly known as:  NITROSTAT Place 1 tablet (0.4 mg total) under the tongue every 5 (five) minutes as needed for chest pain.   simvastatin 10 MG tablet Commonly known as:  ZOCOR Take 10 mg by mouth daily.   vitamin E 400 UNIT capsule Take 400  Units by mouth daily.       Allergies:  Allergies  Allergen Reactions  . Bactrim [Sulfamethoxazole-Trimethoprim] Nausea And Vomiting  . Diphenhydramine Hcl Hives    Past Medical History, Surgical history, Social history, and Family History were reviewed and updated.  Review of Systems: All other 10 point review of systems is negative.   Physical Exam:  vitals were not taken for this visit.  Wt Readings from Last 3 Encounters:  12/28/15 123 lb 12.8 oz (56.2 kg)  09/27/15 127 lb (57.6 kg)  05/21/15 127 lb (57.6 kg)    Ocular: Sclerae unicteric, pupils equal, round and reactive to light Ear-nose-throat: Oropharynx clear, dentition fair Lymphatic: No  cervical supraclavicular or axillary adenopathy Lungs no rales or rhonchi, good excursion bilaterally Heart regular rate and rhythm, no murmur appreciated Abd soft, nontender, positive bowel sounds, no liver or spleen tip palpated on exam, no fluid wave  MSK no focal spinal tenderness, no joint edema Neuro: non-focal, well-oriented, appropriate affect Breasts: Deferred  Lab Results  Component Value Date   WBC 6.6 12/28/2015   HGB 15.3 12/28/2015   HCT 45.7 12/28/2015   MCV 89 12/28/2015   PLT 233 12/28/2015   Lab Results  Component Value Date   FERRITIN 13 09/16/2014   IRON 37 (L) 09/16/2014   TIBC 454 (H) 09/16/2014   UIBC 417 (H) 09/16/2014   IRONPCTSAT 8 (L) 09/16/2014   Lab Results  Component Value Date   RETICCTPCT 1.2 08/03/2010   RBC 5.14 12/28/2015   RETICCTABS 61.3 08/03/2010   No results found for: KPAFRELGTCHN, LAMBDASER, KAPLAMBRATIO No results found for: IGGSERUM, IGA, IGMSERUM No results found for: Ronnald Ramp, A1GS, A2GS, Tillman Sers, SPEI   Chemistry      Component Value Date/Time   NA 136 12/28/2015 1311   K 4.9 12/28/2015 1311   CL 100 03/19/2015 1456   CL 100 06/17/2014 1036   CO2 28 12/28/2015 1311   BUN 10.4 12/28/2015 1311   CREATININE 0.9 12/28/2015 1311      Component Value Date/Time   CALCIUM 9.6 12/28/2015 1311   ALKPHOS 83 12/28/2015 1311   AST 25 12/28/2015 1311   ALT 13 12/28/2015 1311   BILITOT 0.74 12/28/2015 1311     Impression and Plan: [name] is a 66 yo white female with polycythemia. She has responded nicely to phlebotomy and is doing well. Hct today is 43.2 so no phlebotomy needed at this time. CMP stable.  We will plan to see Amber back in 3 months for repeat lab work and follow-up.  She will contact us with any questions or concerns. We can certainly see Amber sooner if need be.   Eliezer Bottom, NP 2/7/20182:25 PM

## 2016-03-31 DIAGNOSIS — M67431 Ganglion, right wrist: Secondary | ICD-10-CM | POA: Diagnosis not present

## 2016-05-04 ENCOUNTER — Encounter: Payer: Self-pay | Admitting: Interventional Cardiology

## 2016-05-06 ENCOUNTER — Other Ambulatory Visit: Payer: Self-pay | Admitting: Interventional Cardiology

## 2016-05-08 ENCOUNTER — Ambulatory Visit (HOSPITAL_COMMUNITY)
Admission: RE | Admit: 2016-05-08 | Discharge: 2016-05-08 | Disposition: A | Discharge status: Home / Self Care | Payer: PPO | Source: Ambulatory Visit | Attending: Cardiovascular Disease | Admitting: Cardiovascular Disease

## 2016-05-08 ENCOUNTER — Encounter (HOSPITAL_COMMUNITY): Payer: Self-pay

## 2016-05-08 DIAGNOSIS — I6523 Occlusion and stenosis of bilateral carotid arteries: Secondary | ICD-10-CM | POA: Insufficient documentation

## 2016-05-08 NOTE — Progress Notes (Signed)
Today's bilateral carotid artery duplex is stable 1-39% RICA stenosis, s/p CEA. LICA stenosis is >51%. Preliminary results given to [name].

## 2016-05-11 ENCOUNTER — Telehealth: Payer: Self-pay | Admitting: Interventional Cardiology

## 2016-05-11 DIAGNOSIS — I6529 Occlusion and stenosis of unspecified carotid artery: Secondary | ICD-10-CM

## 2016-05-11 NOTE — Telephone Encounter (Signed)
New Message ° °Pt voiced returning nurses call. ° °Please f/u °

## 2016-05-11 NOTE — Telephone Encounter (Signed)
Spoke with pt and went over carotid results.  Pt agreeable to plan.  Pt referred to Dr. Donnetta Hutching at [facility].  Pt appreciative for call.

## 2016-05-14 DIAGNOSIS — I779 Disorder of arteries and arterioles, unspecified: Secondary | ICD-10-CM | POA: Insufficient documentation

## 2016-05-14 DIAGNOSIS — I739 Peripheral vascular disease, unspecified: Secondary | ICD-10-CM

## 2016-05-14 NOTE — Progress Notes (Signed)
Cardiology Office Note    Date:  05/15/2016   ID:  [name], DOB 1951-01-28, MRN [id number]  PCP:  Wenda Low, MD  Cardiologist: Sinclair Grooms, MD   Chief Complaint  Patient presents with  . Coronary Artery Disease  . Follow-up    Pre-op clearance    History of Present Illness:  [name] is a 66 y.o. female who presents for follow-up of coronary artery disease with prior Cfx stent 2010, hypertension, carotid artery disease with RCEA 2007, history of carotid endarterectomy, and hyperlipidemia. She also has polycythemia.  Routine screening of carotids demonstrates progression of left internal carotid stenosis. No focal/transient left hemispheric symptoms.  Denies chest pain, exertional dyspnea, and syncope. No lower extremity swelling or claudication. Though I recommended increasing metoprolol sometime ago, she never did. She still takes metoprolol succinate 100 mg daily. Blood pressure is elevated today but she has had no medication yet.  Past Medical History:  Diagnosis Date  . Arthritis   . CAD (coronary artery disease)   . GERD (gastroesophageal reflux disease)   . Hepatitis C   . Hypertension   . Polycythemia 2009    Past Surgical History:  Procedure Laterality Date  . 2 stints  Aug 26,2010   Dr. Tawnya Crook  . cartoid endartherectomy  2007  . CORONARY ANGIOPLASTY WITH STENT PLACEMENT  10/09/2008    Current Medications: Outpatient Medications Prior to Visit  Medication Sig Dispense Refill  . acetaminophen (TYLENOL) 500 MG tablet Take 500-1,000 mg by mouth every 6 (six) hours as needed for mild pain or headache.    . albuterol (PROVENTIL HFA;VENTOLIN HFA) 108 (90 BASE) MCG/ACT inhaler Inhale 2 puffs into the lungs every 6 (six) hours as needed for wheezing or shortness of breath. 1 Inhaler 2  . ALPRAZolam (XANAX) 0.5 MG tablet Take 0.5 mg by mouth at bedtime.     . clopidogrel (PLAVIX) 75 MG tablet TAKE ONE TABLET BY MOUTH ONCE DAILY 30 tablet 6  .  clotrimazole-betamethasone (LOTRISONE) cream use 1 application to affected area as needed for sore areas on body    . escitalopram (LEXAPRO) 20 MG tablet Take 20 mg by mouth daily.    . hydrochlorothiazide (MICROZIDE) 12.5 MG capsule Take 1 capsule (12.5 mg total) by mouth daily. 30 capsule 0  . lansoprazole (PREVACID) 30 MG capsule Take 30 mg by mouth daily as needed (for ulcer/acid reflex). Ulcer/ acid reflux    . metoprolol succinate (TOPROL-XL) 100 MG 24 hr tablet Take 150 mg by mouth daily. Take with or immediately following a meal.    . nitroGLYCERIN (NITROSTAT) 0.4 MG SL tablet Place 1 tablet (0.4 mg total) under the tongue every 5 (five) minutes as needed for chest pain. 25 tablet 12  . simvastatin (ZOCOR) 10 MG tablet Take 10 mg by mouth daily.     . vitamin E 400 UNIT capsule Take 400 Units by mouth daily.     No facility-administered medications prior to visit.      Allergies:   Bactrim [sulfamethoxazole-trimethoprim] and Diphenhydramine hcl   Social History   Social History  . Marital status: Single    Spouse name: N/A  . Number of children: N/A  . Years of education: N/A   Social History Main Topics  . Smoking status: Current Every Day Smoker    Packs/day: 1.00    Years: 48.00    Types: Cigarettes    Start date: 03/20/1968  . Smokeless tobacco: Never Used  Comment: 06-17-14  trying to cut back  . Alcohol use No     Comment: occassionally  . Drug use: No     Comment: has smoked cigarettes off and on  . Sexual activity: Not Currently   Other Topics Concern  . None   Social History Narrative   Tobacco use cigarettes: Current smoker   Frequency 1 PPD   Estimated Pack - years :30   Smoking : yes    No Tobacco exposure   No alcohol   No exercise   Occupation : employed, works at home for [employer], Press photographer   Children Boys 1 Girls 1     Family History:  The patient's family history includes Heart disease in her father  and mother.   ROS:   Please see the history of present illness.    Abdominal discomfort, cough, hearing loss, unexplained weight loss, headaches.  All other systems reviewed and are negative.   PHYSICAL EXAM:   VS:  BP (!) 158/84 (BP Location: Left Arm)   Pulse 63   Ht 5' 0.5" (1.537 m)   Wt 121 lb 9.6 oz (55.2 kg)   BMI 23.36 kg/m    GEN: Well nourished, well developed, in no acute distress  HEENT: normal  Neck: no JVD. High pitched left carotid bruit. No JVD. Cardiac: RRR; no murmurs, rubs, or gallops,no edema  Respiratory:  clear to auscultation bilaterally, normal work of breathing GI: soft, nontender, nondistended, + BS MS: no deformity or atrophy  Skin: warm and dry, no rash Neuro:  Alert and Oriented x 3, Strength and sensation are intact Psych: euthymic mood, full affect  Wt Readings from Last 3 Encounters:  05/15/16 121 lb 9.6 oz (55.2 kg)  12/28/15 123 lb 12.8 oz (56.2 kg)  09/27/15 127 lb (57.6 kg)      Studies/Labs Reviewed:   EKG:  EKG  Sinus rhythm, left atrial abnormality, prominent voltage. No significant abnormality noted.  Recent Labs: 03/29/2016: ALT 11; BUN 14; Creatinine, Ser 0.80; HGB 14.0; Platelets 251; Potassium, Ser 5.1; Sodium 133   Lipid Panel    Component Value Date/Time   CHOL 202 (H) 09/16/2014 1322   TRIG 184 (H) 09/16/2014 1322   HDL 40 (L) 09/16/2014 1322   CHOLHDL 5.1 (H) 09/16/2014 1322   VLDL 37 (H) 09/16/2014 1322   LDLCALC 125 09/16/2014 1322    Additional studies/ records that were reviewed today include:  Stress Cardiolite 03/22/15: Study Highlights    Nuclear stress EF: 49%.  There was no ST segment deviation noted during stress.  The study is normal.  This is a low risk study.  The left ventricular ejection fraction is mildly decreased (45-54%).   Vascular Doppler study 05/08/16: Heterogeneous plaque, bilaterally. Stable 1-39% RICA stenosis, s/p CEA with DPA. Stable LICA velocities compared to prior exam, now  in 80-99% range of stenosis. >50% bilateral ECA stenosis. Normal subclavian arteries, bilaterally. Patent vertebral arteries with antegrade flow. Suggest PV consult. Preliminary results given to [name].  ASSESSMENT:    1. Coronary artery disease involving native coronary artery of native heart with angina pectoris ([place])   2. Bilateral carotid artery disease ([place])   3. Essential hypertension   4. Tobacco abuse   5. Preoperative cardiovascular examination   6. Polycythemia   7. COPD exacerbation ([place])     PLAN:  In order of problems listed above:  1. Asymptomatic with reference to angina. She continues to smoke. Within the  past 14 months a myocardial perfusion study was performed and was low risk. No interval symptoms. She is cleared to undergo left carotid endarterectomy at the discretion of Dr. Darene Lamer. Early. 2. Progression of left internal carotid to the 80-99 percent range. Referred for vascular surgery evaluation. 3. Blood pressure is too high. She never increased metoprolol to 150 mg per day as directed. Instructed to increase metoprolol succinate to 150 mg daily. 4. Long discussion concerning smoking cessation and the impact of smoking on her vascular prognosis. She promises to try. 5. CLEARED for upcoming left carotid endarterectomy. No cardiac evaluation will be needed unless and will development of symptoms. 6. Also discussed the likelihood that smoking is making polycythemia worse. 7. Smoking cessation again reiterated.  Clinical follow-up with me in one year. Smoking cessation discussed.  Cleared for upcoming vascular surgery by Dr. Donnetta Hutching.   Medication Adjustments/Labs and Tests Ordered: Current medicines are reviewed at length with the patient today.  Concerns regarding medicines are outlined above.  Medication changes, Labs and Tests ordered today are listed in the Patient Instructions below. Patient Instructions  Medication Instructions:  1) Go back to taking  Metoprolol Succinate 150mg  once daily  Labwork: None  Testing/Procedures: None  Follow-Up: Your physician wants you to follow-up in: 1 year with [name].  You will receive a reminder letter in the mail two months in advance. If you don't receive a letter, please call our office to schedule the follow-up appointment.   Please contact Dr. Luther Parody office for an appointment.  [phone]  Any Other Special Instructions Will Be Listed Below (If Applicable).  STOP SMOKING!!!! Steps to Quit Smoking Smoking tobacco can be bad for your health. It can also affect almost every organ in your body. Smoking puts you and people around you at risk for many serious long-lasting (chronic) diseases. Quitting smoking is hard, but it is one of the best things that you can do for your health. It is never too late to quit. What are the benefits of quitting smoking? When you quit smoking, you lower your risk for getting serious diseases and conditions. They can include:  Lung cancer or lung disease.  Heart disease.  Stroke.  Heart attack.  Not being able to have children (infertility).  Weak bones (osteoporosis) and broken bones (fractures). If you have coughing, wheezing, and shortness of breath, those symptoms may get better when you quit. You may also get sick less often. If you are pregnant, quitting smoking can help to lower your chances of having a baby of low birth weight. What can I do to help me quit smoking? Talk with your doctor about what can help you quit smoking. Some things you can do (strategies) include:  Quitting smoking totally, instead of slowly cutting back how much you smoke over a period of time.  Going to in-person counseling. You are more likely to quit if you go to many counseling sessions.  Using resources and support systems, such as:  Online chats with a Social worker.  Phone quitlines.  Printed Furniture conservator/restorer.  Support groups or group counseling.  Text  messaging programs.  Mobile phone apps or applications.  Taking medicines. Some of these medicines may have nicotine in them. If you are pregnant or breastfeeding, do not take any medicines to quit smoking unless your doctor says it is okay. Talk with your doctor about counseling or other things that can help you. Talk with your doctor about using more than one strategy at the same time,  such as taking medicines while you are also going to in-person counseling. This can help make quitting easier. What things can I do to make it easier to quit? Quitting smoking might feel very hard at first, but there is a lot that you can do to make it easier. Take these steps:  Talk to your family and friends. Ask them to support and encourage you.  Call phone quitlines, reach out to support groups, or work with a Social worker.  Ask people who smoke to not smoke around you.  Avoid places that make you want (trigger) to smoke, such as:  Bars.  Parties.  Smoke-break areas at work.  Spend time with people who do not smoke.  Lower the stress in your life. Stress can make you want to smoke. Try these things to help your stress:  Getting regular exercise.  Deep-breathing exercises.  Yoga.  Meditating.  Doing a body scan. To do this, close your eyes, focus on one area of your body at a time from head to toe, and notice which parts of your body are tense. Try to relax the muscles in those areas.  Download or buy apps on your mobile phone or tablet that can help you stick to your quit plan. There are many free apps, such as QuitGuide from the [employer] Office manager for Disease Control and Prevention). You can find more support from smokefree.gov and other websites. This information is not intended to replace advice given to you by your health care provider. Make sure you discuss any questions you have with your health care provider. Document Released: 12/03/2008 Document Revised: 10/05/2015 Document Reviewed:  06/23/2014 Elsevier Interactive Patient Education  2017 [employer].    If you need a refill on your cardiac medications before your next appointment, please call your pharmacy.      Signed, Sinclair Grooms, MD  05/15/2016 10:18 AM    [address], [place], [place]  [address] Phone: [phone]; Fax: [phone]

## 2016-05-15 ENCOUNTER — Ambulatory Visit (INDEPENDENT_AMBULATORY_CARE_PROVIDER_SITE_OTHER): Payer: PPO | Admitting: Interventional Cardiology

## 2016-05-15 ENCOUNTER — Encounter: Payer: Self-pay | Admitting: Interventional Cardiology

## 2016-05-15 VITALS — BP 158/84 | HR 63 | Ht 60.5 in | Wt 121.6 lb

## 2016-05-15 DIAGNOSIS — Z01810 Encounter for preprocedural cardiovascular examination: Secondary | ICD-10-CM

## 2016-05-15 DIAGNOSIS — I739 Peripheral vascular disease, unspecified: Secondary | ICD-10-CM

## 2016-05-15 DIAGNOSIS — I10 Essential (primary) hypertension: Secondary | ICD-10-CM

## 2016-05-15 DIAGNOSIS — J441 Chronic obstructive pulmonary disease with (acute) exacerbation: Secondary | ICD-10-CM | POA: Diagnosis not present

## 2016-05-15 DIAGNOSIS — Z720 Tobacco use: Secondary | ICD-10-CM | POA: Diagnosis not present

## 2016-05-15 DIAGNOSIS — I779 Disorder of arteries and arterioles, unspecified: Secondary | ICD-10-CM

## 2016-05-15 DIAGNOSIS — I25119 Atherosclerotic heart disease of native coronary artery with unspecified angina pectoris: Secondary | ICD-10-CM | POA: Diagnosis not present

## 2016-05-15 DIAGNOSIS — D751 Secondary polycythemia: Secondary | ICD-10-CM | POA: Diagnosis not present

## 2016-05-15 NOTE — Patient Instructions (Signed)
Medication Instructions:  1) Go back to taking Metoprolol Succinate 150mg  once daily  Labwork: None  Testing/Procedures: None  Follow-Up: Your physician wants you to follow-up in: 1 year with [name].  You will receive a reminder letter in the mail two months in advance. If you don't receive a letter, please call our office to schedule the follow-up appointment.   Please contact Dr. Luther Parody office for an appointment.  [phone]  Any Other Special Instructions Will Be Listed Below (If Applicable).  STOP SMOKING!!!! Steps to Quit Smoking Smoking tobacco can be bad for your health. It can also affect almost every organ in your body. Smoking puts you and people around you at risk for many serious long-lasting (chronic) diseases. Quitting smoking is hard, but it is one of the best things that you can do for your health. It is never too late to quit. What are the benefits of quitting smoking? When you quit smoking, you lower your risk for getting serious diseases and conditions. They can include:  Lung cancer or lung disease.  Heart disease.  Stroke.  Heart attack.  Not being able to have children (infertility).  Weak bones (osteoporosis) and broken bones (fractures). If you have coughing, wheezing, and shortness of breath, those symptoms may get better when you quit. You may also get sick less often. If you are pregnant, quitting smoking can help to lower your chances of having a baby of low birth weight. What can I do to help me quit smoking? Talk with your doctor about what can help you quit smoking. Some things you can do (strategies) include:  Quitting smoking totally, instead of slowly cutting back how much you smoke over a period of time.  Going to in-person counseling. You are more likely to quit if you go to many counseling sessions.  Using resources and support systems, such as:  Online chats with a Social worker.  Phone quitlines.  Printed Social research officer, government.  Support groups or group counseling.  Text messaging programs.  Mobile phone apps or applications.  Taking medicines. Some of these medicines may have nicotine in them. If you are pregnant or breastfeeding, do not take any medicines to quit smoking unless your doctor says it is okay. Talk with your doctor about counseling or other things that can help you. Talk with your doctor about using more than one strategy at the same time, such as taking medicines while you are also going to in-person counseling. This can help make quitting easier. What things can I do to make it easier to quit? Quitting smoking might feel very hard at first, but there is a lot that you can do to make it easier. Take these steps:  Talk to your family and friends. Ask them to support and encourage you.  Call phone quitlines, reach out to support groups, or work with a Social worker.  Ask people who smoke to not smoke around you.  Avoid places that make you want (trigger) to smoke, such as:  Bars.  Parties.  Smoke-break areas at work.  Spend time with people who do not smoke.  Lower the stress in your life. Stress can make you want to smoke. Try these things to help your stress:  Getting regular exercise.  Deep-breathing exercises.  Yoga.  Meditating.  Doing a body scan. To do this, close your eyes, focus on one area of your body at a time from head to toe, and notice which parts of your body are tense. Try to relax  the muscles in those areas.  Download or buy apps on your mobile phone or tablet that can help you stick to your quit plan. There are many free apps, such as QuitGuide from the [employer] Office manager for Disease Control and Prevention). You can find more support from smokefree.gov and other websites. This information is not intended to replace advice given to you by your health care provider. Make sure you discuss any questions you have with your health care provider. Document Released:  12/03/2008 Document Revised: 10/05/2015 Document Reviewed: 06/23/2014 Elsevier Interactive Patient Education  2017 [employer].    If you need a refill on your cardiac medications before your next appointment, please call your pharmacy.

## 2016-05-29 ENCOUNTER — Other Ambulatory Visit: Payer: Self-pay | Admitting: Interventional Cardiology

## 2016-06-11 ENCOUNTER — Other Ambulatory Visit: Payer: Self-pay | Admitting: Interventional Cardiology

## 2016-06-20 ENCOUNTER — Telehealth: Payer: Self-pay | Admitting: Vascular Surgery

## 2016-06-20 NOTE — Telephone Encounter (Signed)
[name] called in to confirm the time/date of her upcoming New vascular consult appointment. She also wanted to make sure she is on our wait list and I assured her that she was. At the patient's request, I checked the schedule to see if we have any sooner appointments and unfortunately we do not.  The patient stated she was having some symptoms of blurred vision and I advised her to call the referring physician for medical advice on what she should do prior to her appt w/ our office. awt

## 2016-06-21 ENCOUNTER — Emergency Department (HOSPITAL_COMMUNITY)
Admission: EM | Admit: 2016-06-21 | Discharge: 2016-06-21 | Disposition: A | Discharge status: Home / Self Care | Payer: PPO | Attending: Emergency Medicine | Admitting: Emergency Medicine

## 2016-06-21 DIAGNOSIS — Y929 Unspecified place or not applicable: Secondary | ICD-10-CM | POA: Insufficient documentation

## 2016-06-21 DIAGNOSIS — F17210 Nicotine dependence, cigarettes, uncomplicated: Secondary | ICD-10-CM | POA: Diagnosis not present

## 2016-06-21 DIAGNOSIS — Z79899 Other long term (current) drug therapy: Secondary | ICD-10-CM | POA: Diagnosis not present

## 2016-06-21 DIAGNOSIS — Z955 Presence of coronary angioplasty implant and graft: Secondary | ICD-10-CM | POA: Diagnosis not present

## 2016-06-21 DIAGNOSIS — I2510 Atherosclerotic heart disease of native coronary artery without angina pectoris: Secondary | ICD-10-CM | POA: Insufficient documentation

## 2016-06-21 DIAGNOSIS — S51811A Laceration without foreign body of right forearm, initial encounter: Secondary | ICD-10-CM | POA: Diagnosis not present

## 2016-06-21 DIAGNOSIS — J449 Chronic obstructive pulmonary disease, unspecified: Secondary | ICD-10-CM | POA: Insufficient documentation

## 2016-06-21 DIAGNOSIS — Y939 Activity, unspecified: Secondary | ICD-10-CM | POA: Insufficient documentation

## 2016-06-21 DIAGNOSIS — Y999 Unspecified external cause status: Secondary | ICD-10-CM | POA: Insufficient documentation

## 2016-06-21 DIAGNOSIS — I10 Essential (primary) hypertension: Secondary | ICD-10-CM | POA: Insufficient documentation

## 2016-06-21 MED ORDER — LIDOCAINE-EPINEPHRINE (PF) 2 %-1:200000 IJ SOLN
10.0000 mL | Freq: Once | INTRAMUSCULAR | Status: AC
  Administered 2016-06-21: 10 mL
  Filled 2016-06-21: qty 20
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
## 2016-06-21 NOTE — ED Notes (Signed)
Bed: WTR9 Expected date:  Expected time:  Means of arrival:  Comments: 
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
## 2016-06-21 NOTE — ED Notes (Signed)
ED Provider at bedside. 

## 2016-06-28 ENCOUNTER — Other Ambulatory Visit (HOSPITAL_BASED_OUTPATIENT_CLINIC_OR_DEPARTMENT_OTHER): Payer: PPO

## 2016-06-28 ENCOUNTER — Ambulatory Visit (HOSPITAL_BASED_OUTPATIENT_CLINIC_OR_DEPARTMENT_OTHER): Payer: PPO | Admitting: Hematology & Oncology

## 2016-06-28 VITALS — BP 170/77 | HR 59 | Temp 99.0°F | Resp 18 | Wt 119.0 lb

## 2016-06-28 DIAGNOSIS — D751 Secondary polycythemia: Secondary | ICD-10-CM

## 2016-06-28 DIAGNOSIS — Z720 Tobacco use: Secondary | ICD-10-CM | POA: Diagnosis not present

## 2016-06-28 DIAGNOSIS — T148XXA Other injury of unspecified body region, initial encounter: Secondary | ICD-10-CM

## 2016-06-28 LAB — CBC WITH DIFFERENTIAL (CANCER CENTER ONLY)
BASO#: 0 10*3/uL (ref 0.0–0.2)
BASO%: 0.3 % (ref 0.0–2.0)
EOS%: 0.4 % (ref 0.0–7.0)
Eosinophils Absolute: 0 10*3/uL (ref 0.0–0.5)
HEMATOCRIT: 44.9 % (ref 34.8–46.6)
HEMOGLOBIN: 15 g/dL (ref 11.6–15.9)
LYMPH#: 1.7 10*3/uL (ref 0.9–3.3)
LYMPH%: 23.5 % (ref 14.0–48.0)
MCH: 30.4 pg (ref 26.0–34.0)
MCHC: 33.4 g/dL (ref 32.0–36.0)
MCV: 91 fL (ref 81–101)
MONO#: 0.8 10*3/uL (ref 0.1–0.9)
MONO%: 11.1 % (ref 0.0–13.0)
NEUT%: 64.7 % (ref 39.6–80.0)
NEUTROS ABS: 4.6 10*3/uL (ref 1.5–6.5)
Platelets: 221 10*3/uL (ref 145–400)
RBC: 4.93 10*6/uL (ref 3.70–5.32)
RDW: 14.1 % (ref 11.1–15.7)
WBC: 7.1 10*3/uL (ref 3.9–10.0)

## 2016-06-28 LAB — CHCC SATELLITE - SMEAR

## 2016-06-28 MED ORDER — MUPIROCIN 2 % EX OINT: TOPICAL_OINTMENT | CUTANEOUS | 1 refills | Status: DC

## 2016-06-28 MED ORDER — AMOXICILLIN-POT CLAVULANATE 875-125 MG PO TABS: 1.0000 | ORAL_TABLET | Freq: Two times a day (BID) | ORAL | 0 refills | Status: DC
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
## 2016-06-29 LAB — COMPREHENSIVE METABOLIC PANEL (CC13)
A/G RATIO: 1.6 (ref 1.2–2.2)
ALT: 9 IU/L (ref 0–32)
AST: 18 IU/L (ref 0–40)
Albumin, Serum: 4.4 g/dL (ref 3.6–4.8)
Alkaline Phosphatase, S: 79 IU/L (ref 39–117)
BILIRUBIN TOTAL: 0.6 mg/dL (ref 0.0–1.2)
BUN/Creatinine Ratio: 15 (ref 12–28)
BUN: 11 mg/dL (ref 8–27)
CHLORIDE: 95 mmol/L — AB (ref 96–106)
Calcium, Ser: 10.2 mg/dL (ref 8.7–10.3)
Carbon Dioxide, Total: 32 mmol/L — ABNORMAL HIGH (ref 18–29)
Creatinine, Ser: 0.74 mg/dL (ref 0.57–1.00)
GFR calc Af Amer: 98 mL/min/{1.73_m2} (ref >59)
GFR calc non Af Amer: 85 mL/min/{1.73_m2} (ref >59)
Globulin, Total: 2.7 g/dL (ref 1.5–4.5)
Glucose: 118 mg/dL — ABNORMAL HIGH (ref 65–99)
Potassium, Ser: 5.2 mmol/L (ref 3.5–5.2)
Sodium: 132 mmol/L — ABNORMAL LOW (ref 134–144)
Total Protein: 7.1 g/dL (ref 6.0–8.5)

## 2016-07-04 ENCOUNTER — Encounter: Payer: Self-pay | Admitting: Vascular Surgery

## 2016-07-05 ENCOUNTER — Ambulatory Visit (HOSPITAL_BASED_OUTPATIENT_CLINIC_OR_DEPARTMENT_OTHER): Payer: PPO

## 2016-07-05 DIAGNOSIS — D751 Secondary polycythemia: Secondary | ICD-10-CM

## 2016-07-05 NOTE — Progress Notes (Signed)
Phleb done using 18 gauge needle and secondary set by Jesse Fall, RN via right [facility].  500gm blood removed over approximately 10 minutes.  Pt tolerated well.  Pt observed 30 minutes post procedure.  Food/Drink provided.  VSS.

## 2016-07-05 NOTE — Patient Instructions (Signed)
     Therapeutic Phlebotomy, Care After Refer to this sheet in the next few weeks. These instructions provide you with information about caring for yourself after your procedure. Your health care provider may also give you more specific instructions. Your treatment has been planned according to current medical practices, but problems sometimes occur. Call your health care provider if you have any problems or questions after your procedure. What can I expect after the procedure? After the procedure, it is common to have:  Light-headedness or dizziness. You may feel faint.  Nausea.  Tiredness. Follow these instructions at home: Activity  Return to your normal activities as directed by your health care provider. Most people can go back to their normal activities right away.  Avoid strenuous physical activity and heavy lifting or pulling for about 5 hours after the procedure. Do not lift anything that is heavier than 10 lb (4.5 kg).  Athletes should avoid strenuous exercise for at least 12 hours.  Change positions slowly for the remainder of the day. This will help to prevent light-headedness or fainting.  If you feel light-headed, lie down until the feeling goes away. Eating and drinking  Be sure to eat well-balanced meals for the next 24 hours.  Drink enough fluid to keep your urine clear or pale yellow.  Avoid drinking alcohol on the day that you had the procedure. Care of the Needle Insertion Site  Keep your bandage dry. You can remove the bandage after about 5 hours or as directed by your health care provider.  If you have bleeding from the needle insertion site, elevate your arm and press firmly on the site until the bleeding stops.  If you have bruising at the site, apply ice to the area:  Put ice in a plastic bag.  Place a towel between your skin and the bag.  Leave the ice on for 20 minutes, 2-3 times a day for the first 24 hours.  If the swelling does not go away  after 24 hours, apply a warm, moist washcloth to the area for 20 minutes, 2-3 times a day. General instructions  Avoid smoking for at least 30 minutes after the procedure.  Keep all follow-up visits as directed by your health care provider. It is important to continue with further therapeutic phlebotomy treatments as directed. Contact a health care provider if:  You have redness, swelling, or pain at the needle insertion site.  You have fluid, blood, or pus coming from the needle insertion site.  You feel light-headed, dizzy, or nauseated, and the feeling does not go away.  You notice new bruising at the needle insertion site.  You feel weaker than normal.  You have a fever or chills. Get help right away if:  You have severe nausea or vomiting.  You have chest pain.  You have trouble breathing. This information is not intended to replace advice given to you by your health care provider. Make sure you discuss any questions you have with your health care provider. Document Released: 07/11/2010 Document Revised: 10/09/2015 Document Reviewed: 02/02/2014 Elsevier Interactive Patient Education  2017 [employer]  

## 2016-07-06 ENCOUNTER — Other Ambulatory Visit: Payer: Self-pay

## 2016-07-06 DIAGNOSIS — I6522 Occlusion and stenosis of left carotid artery: Secondary | ICD-10-CM

## 2016-07-11 ENCOUNTER — Ambulatory Visit (HOSPITAL_COMMUNITY)
Admission: RE | Admit: 2016-07-11 | Discharge: 2016-07-11 | Disposition: A | Discharge status: Home / Self Care | Payer: PPO | Source: Ambulatory Visit | Attending: Vascular Surgery | Admitting: Vascular Surgery

## 2016-07-11 ENCOUNTER — Ambulatory Visit (INDEPENDENT_AMBULATORY_CARE_PROVIDER_SITE_OTHER): Payer: PPO | Admitting: Vascular Surgery

## 2016-07-11 ENCOUNTER — Encounter: Payer: Self-pay | Admitting: Vascular Surgery

## 2016-07-11 ENCOUNTER — Other Ambulatory Visit: Payer: Self-pay

## 2016-07-11 VITALS — BP 176/78 | HR 53 | Temp 98.0°F | Resp 20 | Ht 61.0 in | Wt 121.0 lb

## 2016-07-11 DIAGNOSIS — I6522 Occlusion and stenosis of left carotid artery: Secondary | ICD-10-CM

## 2016-07-11 LAB — VAS US CAROTID
LCCAPSYS: 72 cm/s
LEFT ECA DIAS: -16 cm/s
LICADDIAS: -23 cm/s
LICAPDIAS: 132 cm/s
LICAPSYS: 575 cm/s
Left CCA dist dias: 17 cm/s
Left CCA dist sys: 60 cm/s
Left CCA prox dias: 21 cm/s
Left ICA dist sys: -114 cm/s

## 2016-07-11 NOTE — Progress Notes (Signed)
Vascular and Vein Specialist of [facility]  Patient name: [name] MRN: [id number] DOB: [birth year] Sex: female  REASON FOR CONSULT:   HPI: [name] is a 66 y.o. female, who is seen today for evaluation of critical stenosis left internal carotid artery. She is left-handed. She does have a history of prior right carotid endarterectomy after multiple episodes of amaurosis fugax by Dr. Amedeo Plenty in August 2007. She's had no further neurologic deficits since that time. She specifically denies any aphasia or unilateral weakness. Does have a history of coronary artery disease with coronary stenting in the past. She has remained stable from this. She continues to be a cigarette smoker. She does wish to quit. I again expressed the critical importance in smoking cessation for both her cardiac and peripheral vascular disease. He does have polycythemia vera and underwent a recent phlebotomy due to this.  Past Medical History:  Diagnosis Date  . Arthritis   . CAD (coronary artery disease)   . GERD (gastroesophageal reflux disease)   . Hepatitis C   . Hypertension   . Polycythemia 2009    Family History  Problem Relation Age of Onset  . Heart disease Mother   . Heart disease Father     SOCIAL HISTORY: Social History   Social History  . Marital status: Single    Spouse name: N/A  . Number of children: N/A  . Years of education: N/A   Occupational History  . Not on file.   Social History Main Topics  . Smoking status: Current Every Day Smoker    Packs/day: 1.00    Years: 48.00    Types: Cigarettes    Start date: 03/20/1968  . Smokeless tobacco: Never Used     Comment: "1 pk lasts about 3 days"  . Alcohol use No     Comment: occassionally  . Drug use: No     Comment: has smoked cigarettes off and on  . Sexual activity: Not Currently   Other Topics Concern  . Not on file   Social History Narrative   Tobacco use cigarettes: Current  smoker   Frequency 1 PPD   Estimated Pack - years :30   Smoking : yes    No Tobacco exposure   No alcohol   No exercise   Occupation : employed, works at home for [employer], Educational psychologist Status:single   Children Boys 1 Girls 1    Allergies  Allergen Reactions  . Bactrim [Sulfamethoxazole-Trimethoprim] Nausea And Vomiting  . Diphenhydramine Hcl Hives    Current Outpatient Prescriptions  Medication Sig Dispense Refill  . acetaminophen (TYLENOL) 500 MG tablet Take 500-1,000 mg by mouth every 6 (six) hours as needed for mild pain or headache.    . albuterol (PROVENTIL HFA;VENTOLIN HFA) 108 (90 BASE) MCG/ACT inhaler Inhale 2 puffs into the lungs every 6 (six) hours as needed for wheezing or shortness of breath. 1 Inhaler 2  . ALPRAZolam (XANAX) 0.5 MG tablet Take 0.5 mg by mouth at bedtime.     . clopidogrel (PLAVIX) 75 MG tablet TAKE ONE TABLET BY MOUTH ONCE DAILY 30 tablet 6  . clotrimazole-betamethasone (LOTRISONE) cream use 1 application to affected area as needed for sore areas on body    . escitalopram (LEXAPRO) 20 MG tablet Take 20 mg by mouth daily.    . hydrochlorothiazide (MICROZIDE) 12.5 MG capsule TAKE 1 CAPSULE BY MOUTH ONCE DAILY 90 capsule 3  . lansoprazole (PREVACID) 30 MG capsule Take  30 mg by mouth daily as needed (for ulcer/acid reflex). Ulcer/ acid reflux    . metoprolol succinate (TOPROL-XL) 100 MG 24 hr tablet Take 150 mg by mouth daily. Take with or immediately following a meal.    . mupirocin ointment (BACTROBAN) 2 % Apply twice a day to right arm wound 30 g 1  . nitroGLYCERIN (NITROSTAT) 0.4 MG SL tablet Place 1 tablet (0.4 mg total) under the tongue every 5 (five) minutes as needed for chest pain. 25 tablet 12  . simvastatin (ZOCOR) 10 MG tablet Take 10 mg by mouth daily.     . vitamin E 400 UNIT capsule Take 400 Units by mouth daily.     No current facility-administered medications for this visit.     REVIEW OF SYSTEMS:  [X]  denotes  positive finding, [ ]  denotes negative finding Cardiac  Comments:  Chest pain or chest pressure:    Shortness of breath upon exertion: x   Short of breath when lying flat:    Irregular heart rhythm: x       Vascular    Pain in calf, thigh, or hip brought on by ambulation: x   Pain in feet at night that wakes you up from your sleep:     Blood clot in your veins:    Leg swelling:         Pulmonary    Oxygen at home:    Productive cough:     Wheezing:         Neurologic    Sudden weakness in arms or legs:  x   Sudden numbness in arms or legs:  x   Sudden onset of difficulty speaking or slurred speech:    Temporary loss of vision in one eye:     Problems with dizziness:  x       Gastrointestinal    Blood in stool:     Vomited blood:         Genitourinary    Burning when urinating:     Blood in urine:        Psychiatric    Major depression:         Hematologic    Bleeding problems:    Problems with blood clotting too easily:        Skin    Rashes or ulcers:        Constitutional    Fever or chills:      PHYSICAL EXAM: Vitals:   07/11/16 0948 07/11/16 0955  BP: (!) 177/81 (!) 176/78  Pulse: (!) 53   Resp: 20   Temp: 98 F (36.7 C)   TempSrc: Oral   SpO2: 96%   Weight: 121 lb (54.9 kg)   Height: 5\' 1"  (1.549 m)     GENERAL: The patient is a well-nourished female, in no acute distress. The vital signs are documented above. CARDIOVASCULAR: Harsh left carotid bruit. Well-healed right carotid incision. 2+ radial and 2+ dorsalis pedis pulses bilaterally. PULMONARY: There is good air exchange  ABDOMEN: Soft and non-tender  MUSCULOSKELETAL: There are no major deformities or cyanosis. NEUROLOGIC: No focal weakness or paresthesias are detected. SKIN: There are no ulcers or rashes noted. PSYCHIATRIC: The patient has a normal affect.  DATA:  Reviewed her noninvasive studies from [place] line from March 2018. She underwent confirmatory studies in our office today  regarding her left carotid. She does have a critical stenosis greater than 80%. Her stenosis is at the internal carotid artery with a normal  internal carotid artery past this  MEDICAL ISSUES: I discussed the significance of her critical carotid stenosis with the patient. I have recommended left carotid endarterectomy for reduction of stroke risk. I explained the 1-1-1/2% risk of stroke with surgery. Explained that she would be admitted for surgery the day of and would be discharged to home on postoperative day one soon in no condition. We will hold her Plavix several days prior to the surgery. She wishes to proceed as soon as possible.   [name], MD FACS Vascular and Vein Specialists of [facility] Tel [phone] Pager [phone]

## 2016-07-18 ENCOUNTER — Other Ambulatory Visit: Payer: Self-pay | Admitting: Interventional Cardiology

## 2016-07-20 ENCOUNTER — Encounter (HOSPITAL_COMMUNITY)
Admission: RE | Admit: 2016-07-20 | Discharge: 2016-07-20 | Disposition: A | Discharge status: Home / Self Care | Payer: PPO | Source: Ambulatory Visit | Attending: Vascular Surgery | Admitting: Vascular Surgery

## 2016-07-20 ENCOUNTER — Encounter (HOSPITAL_COMMUNITY): Payer: Self-pay

## 2016-07-20 DIAGNOSIS — I6522 Occlusion and stenosis of left carotid artery: Secondary | ICD-10-CM | POA: Diagnosis not present

## 2016-07-20 DIAGNOSIS — Z01812 Encounter for preprocedural laboratory examination: Secondary | ICD-10-CM | POA: Insufficient documentation

## 2016-07-20 HISTORY — DX: Chronic obstructive pulmonary disease, unspecified: J44.9

## 2016-07-20 HISTORY — DX: Depression, unspecified: F32.A

## 2016-07-20 HISTORY — DX: Dyspnea, unspecified: R06.00

## 2016-07-20 HISTORY — DX: Unspecified convulsions: R56.9

## 2016-07-20 HISTORY — DX: Anxiety disorder, unspecified: F41.9

## 2016-07-20 HISTORY — DX: Major depressive disorder, single episode, unspecified: F32.9

## 2016-07-20 HISTORY — DX: Peripheral vascular disease, unspecified: I73.9

## 2016-07-20 LAB — SURGICAL PCR SCREEN
MRSA, PCR: NEGATIVE
Staphylococcus aureus: NEGATIVE

## 2016-07-20 LAB — URINALYSIS, ROUTINE W REFLEX MICROSCOPIC
BILIRUBIN URINE: NEGATIVE
GLUCOSE, UA: NEGATIVE mg/dL
Hgb urine dipstick: NEGATIVE
KETONES UR: NEGATIVE mg/dL
Leukocytes, UA: NEGATIVE
Nitrite: NEGATIVE
PH: 6 (ref 5.0–8.0)
Protein, ur: NEGATIVE mg/dL
SPECIFIC GRAVITY, URINE: 1.011 (ref 1.005–1.030)

## 2016-07-20 LAB — CBC
HCT: 39.5 % (ref 36.0–46.0)
Hemoglobin: 12.7 g/dL (ref 12.0–15.0)
MCH: 28.9 pg (ref 26.0–34.0)
MCHC: 32.2 g/dL (ref 30.0–36.0)
MCV: 89.8 fL (ref 78.0–100.0)
PLATELETS: 279 10*3/uL (ref 150–400)
RBC: 4.4 MIL/uL (ref 3.87–5.11)
RDW: 13.1 % (ref 11.5–15.5)
WBC: 9.2 10*3/uL (ref 4.0–10.5)

## 2016-07-20 LAB — COMPREHENSIVE METABOLIC PANEL
ALT: 17 U/L (ref 14–54)
ANION GAP: 8 (ref 5–15)
AST: 30 U/L (ref 15–41)
Albumin: 4 g/dL (ref 3.5–5.0)
Alkaline Phosphatase: 68 U/L (ref 38–126)
BUN: 11 mg/dL (ref 6–20)
CHLORIDE: 96 mmol/L — AB (ref 101–111)
CO2: 29 mmol/L (ref 22–32)
Calcium: 8.9 mg/dL (ref 8.9–10.3)
Creatinine, Ser: 0.8 mg/dL (ref 0.44–1.00)
Glucose, Bld: 84 mg/dL (ref 65–99)
Potassium: 4.6 mmol/L (ref 3.5–5.1)
Sodium: 133 mmol/L — ABNORMAL LOW (ref 135–145)
Total Bilirubin: 0.4 mg/dL (ref 0.3–1.2)
Total Protein: 6.8 g/dL (ref 6.5–8.1)

## 2016-07-20 LAB — TYPE AND SCREEN
ABO/RH(D): O POS
ANTIBODY SCREEN: NEGATIVE

## 2016-07-20 LAB — PROTIME-INR
INR: 1.01
PROTHROMBIN TIME: 13.3 s (ref 11.4–15.2)

## 2016-07-20 LAB — ABO/RH: ABO/RH(D): O POS

## 2016-07-20 LAB — APTT: aPTT: 36 seconds (ref 24–36)

## 2016-07-20 NOTE — Pre-Procedure Instructions (Signed)
[name]  07/20/2016      [id number] [name], [place] - [address] [name] [address] Phone: [phone] Fax: [phone]  [name], [name] [address] Phone: [phone] Fax: [phone]    Your procedure is scheduled on 07/24/2016- MONDAY  Report to [facility] Admitting at 6:00 A.M.  Call this number if you have problems the morning of surgery:  [phone]     PLAVIX has stopped , do not resume until given notice     Remember:  Do not eat food or drink liquids after midnight. On Sunday   Take these medicines the morning of surgery with A SIP OF WATER : Lexapro, Prevacid & Metoprolol   Do not wear jewelry, make-up or nail polish.   Do not wear lotions, powders, or perfumes, or deoderant.   Do not shave 48 hours prior to surgery.    Do not bring valuables to the hospital.   [facility] is not responsible for any belongings or valuables.  Contacts, dentures or bridgework may not be worn into surgery.  Leave your suitcase in the car.  After surgery it may be brought to your room.  For patients admitted to the hospital, discharge time will be determined by your treatment team.  Patients discharged the day of surgery will not be allowed to drive home.   Name and phone number of your driver:  With family  Special instructions:  Special Instructions: [place] - Preparing for Surgery  Before surgery, you can play an important role.  Because skin is not sterile, your skin needs to be as free of germs as possible.  You can reduce the number of germs on you skin by washing with CHG (chlorahexidine gluconate) soap before surgery.  CHG is an antiseptic cleaner which kills germs and bonds with the skin to continue killing germs even after washing.  Please DO NOT use if you have an allergy to CHG or antibacterial soaps.  If your  skin becomes reddened/irritated stop using the CHG and inform your nurse when you arrive at Short Stay.  Do not shave (including legs and underarms) for at least 48 hours prior to the first CHG shower.  You may shave your face.  Please follow these instructions carefully:   1.  Shower with CHG Soap the night before surgery and the  morning of Surgery.  2.  If you choose to wash your hair, wash your hair first as usual with your  normal shampoo.  3.  After you shampoo, rinse your hair and body thoroughly to remove the  Shampoo.  4.  Use CHG as you would any other liquid soap.  You can apply chg directly to the skin and wash gently with scrungie or a clean washcloth.  5.  Apply the CHG Soap to your body ONLY FROM THE NECK DOWN.    Do not use on open wounds or open sores.  Avoid contact with your eyes, ears, mouth and genitals (private parts).  Wash genitals (private parts)   with your normal soap.  6.  Wash thoroughly, paying special attention to the area where your surgery will be performed.  7.  Thoroughly rinse your body with warm water from the neck down.  8.  DO NOT shower/wash with your normal soap after using and rinsing off   the CHG Soap.  9.  [name]  yourself dry with a clean towel.            10.  Wear clean pajamas.            11.  Place clean sheets on your bed the night of your first shower and do not sleep with pets.  Day of Surgery  Do not apply any lotions/deodorants the morning of surgery.  Please wear clean clothes to the hospital/surgery center.  Please read over the following fact sheets that you were given. Pain Booklet, Coughing and Deep Breathing, MRSA Information and Surgical Site Infection Prevention

## 2016-07-20 NOTE — Progress Notes (Signed)
Pt. Sees Dr. Lysle Rubens for PCP, also Dr. Linard Millers for cardiac. Pt. Reports chest, breathing & endurance level is normal for her although this week mentions several family issues with death of relative, illness of family member  & she also cares for her 66 y.o. Mother.

## 2016-07-21 NOTE — Progress Notes (Signed)
Anesthesia Chart Review:  Pt is a 66 year old female scheduled for L CEA on 07/24/2016 with Sherren Mocha early, M.D.  - Cardiologist is Daneen Schick, MD who cleared pt for surgery at last office visit 05/15/16.  - Hematologist is Burney Gauze, MD  PMH includes: CAD (2 DES to CX 2010), carotid artery disease (s/p R CEA), HTN, COPD, hepatitis C, polycythemia (last phlebotomy 07/05/16), seizures (x1 1970), GERD. Current smoker. BMI 23.5  Medications include: Albuterol, ASA 81 mg, HCTZ, Prevacid, metoprolol, simvastatin, Plavix. Pt to hold Plavix 1 week prior to surgery.  BP (!) 149/57   Pulse (!) 57   Temp 36.6 C   Resp 20   Ht 5' 0.5" (1.537 m)   Wt 121 lb 12.8 oz (55.2 kg)   SpO2 99%   BMI 23.40 kg/m    Preoperative labs reviewed.    CXR 12/03/15: No active cardiopulmonary disease.  EKG 05/15/16: NSR  Left Carotid duplex 07/11/16: 80-99% L I CA stenosis  B Carotid duplex 05/08/16:  - Heterogeneous plaque, bilaterally. - Stable 1-39% RICA stenosis, s/p CEA with DPA. - Stable LICA velocities compared to prior exam, now in 80-99% range of stenosis. - >50% bilateral ECA stenosis. - Normal subclavian arteries, bilaterally. - Patent vertebral arteries with antegrade flow.  Nuclear stress test 03/22/15:   Nuclear stress EF: 49%.  There was no ST segment deviation noted during stress.  The study is normal.  This is a low risk study.  The left ventricular ejection fraction is mildly decreased (45-54%).  Cardiac cath 10/15/08:  1. Severe stenosis (60-80%, 90%) in the LCx system with successful DES x2 placement in sequential fashion. 2. There is residual moderately severe distal RCA stenosis (60-70%). 3. Mild to moderate atherosclerosis in the LAD (30-40%). 4. Normal left ventricular function.   If no changes, I anticipate pt can proceed with surgery as scheduled.   Willeen Cass, FNP-BC [facility]/Anesthesiology Phone: [phone] 07/21/2016 9:58 AM

## 2016-07-24 ENCOUNTER — Inpatient Hospital Stay (HOSPITAL_COMMUNITY): Payer: PPO | Admitting: Emergency Medicine

## 2016-07-24 ENCOUNTER — Inpatient Hospital Stay (HOSPITAL_COMMUNITY)
Admission: RE | Admit: 2016-07-24 | Discharge: 2016-07-25 | DRG: 039 | Disposition: A | Discharge status: Home / Self Care | Payer: PPO | Attending: Vascular Surgery | Admitting: Vascular Surgery

## 2016-07-24 ENCOUNTER — Inpatient Hospital Stay (HOSPITAL_COMMUNITY): Payer: PPO | Admitting: Certified Registered"

## 2016-07-24 ENCOUNTER — Encounter (HOSPITAL_COMMUNITY)
Admission: RE | Disposition: A | Discharge status: Home / Self Care | Payer: Self-pay | Source: Home / Self Care | Attending: Vascular Surgery

## 2016-07-24 ENCOUNTER — Encounter (HOSPITAL_COMMUNITY): Payer: Self-pay | Admitting: Certified Registered"

## 2016-07-24 DIAGNOSIS — I6529 Occlusion and stenosis of unspecified carotid artery: Secondary | ICD-10-CM | POA: Diagnosis present

## 2016-07-24 DIAGNOSIS — I2510 Atherosclerotic heart disease of native coronary artery without angina pectoris: Secondary | ICD-10-CM | POA: Diagnosis not present

## 2016-07-24 DIAGNOSIS — I10 Essential (primary) hypertension: Secondary | ICD-10-CM | POA: Diagnosis present

## 2016-07-24 DIAGNOSIS — I6522 Occlusion and stenosis of left carotid artery: Principal | ICD-10-CM | POA: Diagnosis present

## 2016-07-24 DIAGNOSIS — F329 Major depressive disorder, single episode, unspecified: Secondary | ICD-10-CM | POA: Diagnosis present

## 2016-07-24 DIAGNOSIS — J449 Chronic obstructive pulmonary disease, unspecified: Secondary | ICD-10-CM | POA: Diagnosis not present

## 2016-07-24 DIAGNOSIS — Z955 Presence of coronary angioplasty implant and graft: Secondary | ICD-10-CM

## 2016-07-24 DIAGNOSIS — F17210 Nicotine dependence, cigarettes, uncomplicated: Secondary | ICD-10-CM | POA: Diagnosis not present

## 2016-07-24 DIAGNOSIS — K219 Gastro-esophageal reflux disease without esophagitis: Secondary | ICD-10-CM | POA: Diagnosis not present

## 2016-07-24 DIAGNOSIS — Z79899 Other long term (current) drug therapy: Secondary | ICD-10-CM | POA: Diagnosis not present

## 2016-07-24 DIAGNOSIS — D45 Polycythemia vera: Secondary | ICD-10-CM | POA: Diagnosis not present

## 2016-07-24 DIAGNOSIS — I739 Peripheral vascular disease, unspecified: Secondary | ICD-10-CM | POA: Diagnosis present

## 2016-07-24 DIAGNOSIS — F419 Anxiety disorder, unspecified: Secondary | ICD-10-CM | POA: Diagnosis present

## 2016-07-24 DIAGNOSIS — Z8249 Family history of ischemic heart disease and other diseases of the circulatory system: Secondary | ICD-10-CM | POA: Diagnosis not present

## 2016-07-24 DIAGNOSIS — Z888 Allergy status to other drugs, medicaments and biological substances status: Secondary | ICD-10-CM | POA: Diagnosis not present

## 2016-07-24 DIAGNOSIS — Z882 Allergy status to sulfonamides status: Secondary | ICD-10-CM

## 2016-07-24 DIAGNOSIS — F418 Other specified anxiety disorders: Secondary | ICD-10-CM | POA: Diagnosis not present

## 2016-07-24 DIAGNOSIS — Z7902 Long term (current) use of antithrombotics/antiplatelets: Secondary | ICD-10-CM | POA: Diagnosis not present

## 2016-07-24 HISTORY — PX: ENDARTERECTOMY: SHX5162

## 2016-07-24 LAB — CBC
HEMATOCRIT: 32.9 % — AB (ref 36.0–46.0)
Hemoglobin: 10.5 g/dL — ABNORMAL LOW (ref 12.0–15.0)
MCH: 28.7 pg (ref 26.0–34.0)
MCHC: 31.9 g/dL (ref 30.0–36.0)
MCV: 89.9 fL (ref 78.0–100.0)
Platelets: 183 10*3/uL (ref 150–400)
RBC: 3.66 MIL/uL — ABNORMAL LOW (ref 3.87–5.11)
RDW: 12.9 % (ref 11.5–15.5)
WBC: 8.6 10*3/uL (ref 4.0–10.5)

## 2016-07-24 LAB — CREATININE, SERUM: Creatinine, Ser: 0.74 mg/dL (ref 0.44–1.00)

## 2016-07-24 SURGERY — ENDARTERECTOMY, CAROTID
Anesthesia: General | Site: Neck | Laterality: Left

## 2016-07-24 MED ORDER — FENTANYL CITRATE (PF) 100 MCG/2ML IJ SOLN
INTRAMUSCULAR | Status: DC | PRN
  Administered 2016-07-24: 50 ug via INTRAVENOUS

## 2016-07-24 MED ORDER — LIDOCAINE HCL (CARDIAC) 20 MG/ML IV SOLN
INTRAVENOUS | Status: DC | PRN
  Administered 2016-07-24: 60 mg via INTRAVENOUS

## 2016-07-24 MED ORDER — HYDROCHLOROTHIAZIDE 12.5 MG PO CAPS
12.5000 mg | ORAL_CAPSULE | Freq: Every day | ORAL | Status: DC
  Administered 2016-07-24 – 2016-07-25 (×2): 12.5 mg via ORAL
  Filled 2016-07-24 (×2): qty 1

## 2016-07-24 MED ORDER — HYDROMORPHONE HCL 1 MG/ML IJ SOLN
0.2500 mg | INTRAMUSCULAR | Status: DC | PRN
  Administered 2016-07-24 (×4): 0.25 mg via INTRAVENOUS

## 2016-07-24 MED ORDER — HYDROMORPHONE HCL 1 MG/ML IJ SOLN
INTRAMUSCULAR | Status: AC
  Filled 2016-07-24: qty 0.5

## 2016-07-24 MED ORDER — OXYCODONE-ACETAMINOPHEN 5-325 MG PO TABS
1.0000 | ORAL_TABLET | ORAL | Status: DC | PRN
  Administered 2016-07-24 – 2016-07-25 (×3): 2 via ORAL
  Filled 2016-07-24 (×3): qty 2

## 2016-07-24 MED ORDER — NICOTINE 7 MG/24HR TD PT24
7.0000 mg | MEDICATED_PATCH | Freq: Every day | TRANSDERMAL | Status: DC
  Administered 2016-07-24 – 2016-07-25 (×2): 7 mg via TRANSDERMAL
  Filled 2016-07-24 (×2): qty 1

## 2016-07-24 MED ORDER — PROPOFOL 10 MG/ML IV BOLUS
INTRAVENOUS | Status: AC
  Filled 2016-07-24: qty 20

## 2016-07-24 MED ORDER — SUCCINYLCHOLINE CHLORIDE 200 MG/10ML IV SOSY
PREFILLED_SYRINGE | INTRAVENOUS | Status: AC
  Filled 2016-07-24: qty 10

## 2016-07-24 MED ORDER — METOPROLOL TARTRATE 5 MG/5ML IV SOLN: 2.0000 mg | INTRAVENOUS | Status: DC | PRN

## 2016-07-24 MED ORDER — EPHEDRINE 5 MG/ML INJ
INTRAVENOUS | Status: AC
  Filled 2016-07-24: qty 10

## 2016-07-24 MED ORDER — DEXTROSE 5 % IV SOLN
1.5000 g | INTRAVENOUS | Status: AC
  Administered 2016-07-24: 1.5 g via INTRAVENOUS

## 2016-07-24 MED ORDER — EPHEDRINE SULFATE 50 MG/ML IJ SOLN
INTRAMUSCULAR | Status: DC | PRN
  Administered 2016-07-24 (×4): 10 mg via INTRAVENOUS

## 2016-07-24 MED ORDER — PROPOFOL 10 MG/ML IV BOLUS
INTRAVENOUS | Status: DC | PRN
  Administered 2016-07-24: 140 mg via INTRAVENOUS

## 2016-07-24 MED ORDER — SODIUM CHLORIDE 0.9 % IV SOLN: INTRAVENOUS | Status: DC

## 2016-07-24 MED ORDER — FENTANYL CITRATE (PF) 250 MCG/5ML IJ SOLN
INTRAMUSCULAR | Status: AC
  Filled 2016-07-24: qty 5

## 2016-07-24 MED ORDER — ROCURONIUM BROMIDE 10 MG/ML (PF) SYRINGE
PREFILLED_SYRINGE | INTRAVENOUS | Status: AC
  Filled 2016-07-24: qty 5

## 2016-07-24 MED ORDER — ASPIRIN EC 81 MG PO TBEC
81.0000 mg | DELAYED_RELEASE_TABLET | Freq: Every day | ORAL | Status: DC
Start: 2016-07-25 — End: 2016-07-25
  Administered 2016-07-25: 81 mg via ORAL
  Filled 2016-07-24: qty 1

## 2016-07-24 MED ORDER — ONDANSETRON HCL 4 MG/2ML IJ SOLN
INTRAMUSCULAR | Status: DC | PRN
  Administered 2016-07-24: 4 mg via INTRAVENOUS

## 2016-07-24 MED ORDER — HEPARIN SODIUM (PORCINE) 1000 UNIT/ML IJ SOLN
INTRAMUSCULAR | Status: DC | PRN
  Administered 2016-07-24: 6000 [IU] via INTRAVENOUS

## 2016-07-24 MED ORDER — SODIUM CHLORIDE 0.9 % IV SOLN
0.0125 ug/kg/min | INTRAVENOUS | Status: AC
  Administered 2016-07-24: .1 ug/kg/min via INTRAVENOUS
  Filled 2016-07-24: qty 2000

## 2016-07-24 MED ORDER — SODIUM CHLORIDE 0.9 % IV SOLN: 500.0000 mL | Freq: Once | INTRAVENOUS | Status: DC | PRN

## 2016-07-24 MED ORDER — POTASSIUM CHLORIDE CRYS ER 20 MEQ PO TBCR: 20.0000 meq | EXTENDED_RELEASE_TABLET | Freq: Every day | ORAL | Status: DC | PRN

## 2016-07-24 MED ORDER — MAGNESIUM SULFATE 2 GM/50ML IV SOLN: 2.0000 g | Freq: Every day | INTRAVENOUS | Status: DC | PRN

## 2016-07-24 MED ORDER — LABETALOL HCL 5 MG/ML IV SOLN: 10.0000 mg | INTRAVENOUS | Status: DC | PRN

## 2016-07-24 MED ORDER — ROCURONIUM BROMIDE 100 MG/10ML IV SOLN
INTRAVENOUS | Status: DC | PRN
  Administered 2016-07-24: 5 mg via INTRAVENOUS
  Administered 2016-07-24: 50 mg via INTRAVENOUS

## 2016-07-24 MED ORDER — PROMETHAZINE HCL 25 MG/ML IJ SOLN: 6.2500 mg | INTRAMUSCULAR | Status: DC | PRN

## 2016-07-24 MED ORDER — ALBUTEROL SULFATE (2.5 MG/3ML) 0.083% IN NEBU: 3.0000 mL | INHALATION_SOLUTION | Freq: Four times a day (QID) | RESPIRATORY_TRACT | Status: DC | PRN

## 2016-07-24 MED ORDER — PHENYLEPHRINE 40 MCG/ML (10ML) SYRINGE FOR IV PUSH (FOR BLOOD PRESSURE SUPPORT)
PREFILLED_SYRINGE | INTRAVENOUS | Status: AC
  Filled 2016-07-24: qty 10

## 2016-07-24 MED ORDER — DEXTROSE 5 % IV SOLN
INTRAVENOUS | Status: AC
  Filled 2016-07-24: qty 1.5

## 2016-07-24 MED ORDER — CLOPIDOGREL BISULFATE 75 MG PO TABS
75.0000 mg | ORAL_TABLET | Freq: Every day | ORAL | Status: DC
  Administered 2016-07-25: 75 mg via ORAL
  Filled 2016-07-24: qty 1

## 2016-07-24 MED ORDER — CHLORHEXIDINE GLUCONATE 4 % EX LIQD: 60.0000 mL | Freq: Once | CUTANEOUS | Status: DC

## 2016-07-24 MED ORDER — SODIUM CHLORIDE 0.9 % IV SOLN
INTRAVENOUS | Status: DC
  Administered 2016-07-24: 50 mL/h via INTRAVENOUS

## 2016-07-24 MED ORDER — 0.9 % SODIUM CHLORIDE (POUR BTL) OPTIME
TOPICAL | Status: DC | PRN
  Administered 2016-07-24: 2000 mL

## 2016-07-24 MED ORDER — METOPROLOL SUCCINATE ER 50 MG PO TB24: 150.0000 mg | ORAL_TABLET | Freq: Every day | ORAL | Status: DC

## 2016-07-24 MED ORDER — ONDANSETRON HCL 4 MG/2ML IJ SOLN
INTRAMUSCULAR | Status: AC
  Filled 2016-07-24: qty 2

## 2016-07-24 MED ORDER — MORPHINE SULFATE (PF) 2 MG/ML IV SOLN
2.0000 mg | INTRAVENOUS | Status: DC | PRN
  Administered 2016-07-24: 2 mg via INTRAVENOUS
  Filled 2016-07-24: qty 1

## 2016-07-24 MED ORDER — ALUM & MAG HYDROXIDE-SIMETH 200-200-20 MG/5ML PO SUSP: 15.0000 mL | ORAL | Status: DC | PRN

## 2016-07-24 MED ORDER — LIDOCAINE HCL 1 % IJ SOLN
INTRAMUSCULAR | Status: AC
  Filled 2016-07-24: qty 20

## 2016-07-24 MED ORDER — NITROGLYCERIN 0.4 MG SL SUBL: 0.4000 mg | SUBLINGUAL_TABLET | SUBLINGUAL | Status: DC | PRN

## 2016-07-24 MED ORDER — METOPROLOL SUCCINATE ER 50 MG PO TB24
150.0000 mg | ORAL_TABLET | Freq: Every day | ORAL | Status: DC
  Administered 2016-07-25: 150 mg via ORAL
  Filled 2016-07-24: qty 1

## 2016-07-24 MED ORDER — ESCITALOPRAM OXALATE 20 MG PO TABS
20.0000 mg | ORAL_TABLET | Freq: Every day | ORAL | Status: DC
  Administered 2016-07-25: 20 mg via ORAL
  Filled 2016-07-24: qty 1

## 2016-07-24 MED ORDER — LIDOCAINE 2% (20 MG/ML) 5 ML SYRINGE
INTRAMUSCULAR | Status: AC
  Filled 2016-07-24: qty 10

## 2016-07-24 MED ORDER — ALPRAZOLAM 0.5 MG PO TABS
0.5000 mg | ORAL_TABLET | Freq: Every day | ORAL | Status: DC
  Administered 2016-07-24: 0.5 mg via ORAL
  Filled 2016-07-24: qty 1

## 2016-07-24 MED ORDER — HYDRALAZINE HCL 20 MG/ML IJ SOLN: 5.0000 mg | INTRAMUSCULAR | Status: DC | PRN

## 2016-07-24 MED ORDER — PROTAMINE SULFATE 10 MG/ML IV SOLN
INTRAVENOUS | Status: DC | PRN
  Administered 2016-07-24: 50 mg via INTRAVENOUS

## 2016-07-24 MED ORDER — ONDANSETRON HCL 4 MG/2ML IJ SOLN: 4.0000 mg | Freq: Four times a day (QID) | INTRAMUSCULAR | Status: DC | PRN

## 2016-07-24 MED ORDER — PANTOPRAZOLE SODIUM 40 MG PO TBEC: 40.0000 mg | DELAYED_RELEASE_TABLET | Freq: Every day | ORAL | Status: DC

## 2016-07-24 MED ORDER — ACETAMINOPHEN 500 MG PO TABS: 500.0000 mg | ORAL_TABLET | Freq: Four times a day (QID) | ORAL | Status: DC | PRN

## 2016-07-24 MED ORDER — DOCUSATE SODIUM 100 MG PO CAPS
100.0000 mg | ORAL_CAPSULE | Freq: Every day | ORAL | Status: DC
  Filled 2016-07-24: qty 1

## 2016-07-24 MED ORDER — SIMVASTATIN 20 MG PO TABS
10.0000 mg | ORAL_TABLET | ORAL | Status: DC
  Administered 2016-07-24: 10 mg via ORAL
  Filled 2016-07-24: qty 1

## 2016-07-24 MED ORDER — SUGAMMADEX SODIUM 200 MG/2ML IV SOLN
INTRAVENOUS | Status: AC
  Filled 2016-07-24: qty 2

## 2016-07-24 MED ORDER — PHENOL 1.4 % MT LIQD
1.0000 | OROMUCOSAL | Status: DC | PRN
  Administered 2016-07-25: 1 via OROMUCOSAL
  Filled 2016-07-24: qty 177

## 2016-07-24 MED ORDER — PHENYLEPHRINE HCL 10 MG/ML IJ SOLN
INTRAVENOUS | Status: DC | PRN
  Administered 2016-07-24: 20 ug/min via INTRAVENOUS

## 2016-07-24 MED ORDER — LACTATED RINGERS IV SOLN
INTRAVENOUS | Status: DC
  Administered 2016-07-24 (×2): via INTRAVENOUS

## 2016-07-24 MED ORDER — SUGAMMADEX SODIUM 200 MG/2ML IV SOLN
INTRAVENOUS | Status: DC | PRN
  Administered 2016-07-24: 50 mg via INTRAVENOUS
  Administered 2016-07-24: 150 mg via INTRAVENOUS

## 2016-07-24 MED ORDER — SODIUM CHLORIDE 0.9 % IV SOLN
INTRAVENOUS | Status: DC | PRN
  Administered 2016-07-24: 500 mL

## 2016-07-24 MED ORDER — ENOXAPARIN SODIUM 40 MG/0.4ML ~~LOC~~ SOLN: 40.0000 mg | SUBCUTANEOUS | Status: DC

## 2016-07-24 MED ORDER — VITAMIN E 180 MG (400 UNIT) PO CAPS
400.0000 [IU] | ORAL_CAPSULE | Freq: Every day | ORAL | Status: DC
  Administered 2016-07-24 – 2016-07-25 (×2): 400 [IU] via ORAL
  Filled 2016-07-24 (×2): qty 1

## 2016-07-24 MED ORDER — GUAIFENESIN-DM 100-10 MG/5ML PO SYRP
15.0000 mL | ORAL_SOLUTION | ORAL | Status: DC | PRN
Start: 2016-07-24 — End: 2016-07-25

## 2016-07-24 MED ORDER — DEXTROSE 5 % IV SOLN
1.5000 g | Freq: Two times a day (BID) | INTRAVENOUS | Status: AC
  Administered 2016-07-24 – 2016-07-25 (×2): 1.5 g via INTRAVENOUS
  Filled 2016-07-24 (×2): qty 1.5

## 2016-07-24 SURGICAL SUPPLY — 43 items
ADH SKN CLS APL DERMABOND .7 (GAUZE/BANDAGES/DRESSINGS) ×2
CANISTER SUCT 3000ML PPV (MISCELLANEOUS) ×2 IMPLANT
CANNULA VESSEL 3MM 2 BLNT TIP (CANNULA) ×4 IMPLANT
CATH ROBINSON RED A/P 18FR (CATHETERS) ×2 IMPLANT
CLIP LIGATING EXTRA MED SLVR (CLIP) ×2 IMPLANT
CLIP LIGATING EXTRA SM BLUE (MISCELLANEOUS) ×2 IMPLANT
CRADLE DONUT ADULT HEAD (MISCELLANEOUS) ×2 IMPLANT
DECANTER SPIKE VIAL GLASS SM (MISCELLANEOUS) IMPLANT
DERMABOND ADVANCED (GAUZE/BANDAGES/DRESSINGS) ×2
DERMABOND ADVANCED .7 DNX12 (GAUZE/BANDAGES/DRESSINGS) ×1 IMPLANT
DRAIN HEMOVAC 1/8 X 5 (WOUND CARE) IMPLANT
ELECT REM PT RETURN 9FT ADLT (ELECTROSURGICAL) ×2
ELECTRODE REM PT RTRN 9FT ADLT (ELECTROSURGICAL) ×1 IMPLANT
EVACUATOR SILICONE 100CC (DRAIN) IMPLANT
GLOVE BIO SURGEON STRL SZ 6.5 (GLOVE) ×3 IMPLANT
GLOVE BIOGEL PI IND STRL 6.5 (GLOVE) IMPLANT
GLOVE BIOGEL PI IND STRL 7.0 (GLOVE) ×1 IMPLANT
GLOVE BIOGEL PI INDICATOR 6.5 (GLOVE) ×3
GLOVE BIOGEL PI INDICATOR 7.0 (GLOVE) ×1
GLOVE SS BIOGEL STRL SZ 7.5 (GLOVE) ×1 IMPLANT
GLOVE SUPERSENSE BIOGEL SZ 7.5 (GLOVE) ×1
GLOVE SURG SS PI 6.5 STRL IVOR (GLOVE) ×1 IMPLANT
GOWN STRL NON-REIN LRG LVL3 (GOWN DISPOSABLE) ×2 IMPLANT
GOWN STRL REUS W/ TWL LRG LVL3 (GOWN DISPOSABLE) ×3 IMPLANT
GOWN STRL REUS W/TWL LRG LVL3 (GOWN DISPOSABLE) ×8
KIT BASIN OR (CUSTOM PROCEDURE TRAY) ×2 IMPLANT
KIT ROOM TURNOVER OR (KITS) ×2 IMPLANT
NEEDLE 22X1 1/2 (OR ONLY) (NEEDLE) IMPLANT
NS IRRIG 1000ML POUR BTL (IV SOLUTION) ×4 IMPLANT
PACK CAROTID (CUSTOM PROCEDURE TRAY) ×2 IMPLANT
PAD ARMBOARD 7.5X6 YLW CONV (MISCELLANEOUS) ×4 IMPLANT
PATCH HEMASHIELD 8X75 (Vascular Products) ×1 IMPLANT
SHUNT CAROTID BYPASS 10 (VASCULAR PRODUCTS) ×1 IMPLANT
SHUNT CAROTID BYPASS 12FRX15.5 (VASCULAR PRODUCTS) IMPLANT
SUT ETHILON 3 0 PS 1 (SUTURE) IMPLANT
SUT PROLENE 6 0 CC (SUTURE) ×4 IMPLANT
SUT SILK 3 0 (SUTURE)
SUT SILK 3-0 18XBRD TIE 12 (SUTURE) IMPLANT
SUT VIC AB 3-0 SH 27 (SUTURE) ×4
SUT VIC AB 3-0 SH 27X BRD (SUTURE) ×2 IMPLANT
SUT VICRYL 4-0 PS2 18IN ABS (SUTURE) ×2 IMPLANT
SYR CONTROL 10ML LL (SYRINGE) IMPLANT
WATER STERILE IRR 1000ML POUR (IV SOLUTION) ×2 IMPLANT

## 2016-07-24 NOTE — OR Nursing (Signed)
1002 Pt.  Following commands and moving all four extremities.

## 2016-07-24 NOTE — Transfer of Care (Signed)
Immediate Anesthesia Transfer of Care Note  Patient: [name]  Procedure(s) Performed: Procedure(s): ENDARTERECTOMY CAROTID (Left)  Patient Location: PACU  Anesthesia Type:General  Level of Consciousness: awake, alert  and oriented  Airway & Oxygen Therapy: Patient Spontanous Breathing and Patient connected to nasal cannula oxygen  Post-op Assessment: Report given to RN, Post -op Vital signs reviewed and stable and Patient moving all extremities X 4  Post vital signs: Reviewed and stable  Last Vitals:  Vitals:   07/24/16 0653 07/24/16 1009  BP: (!) 193/71   Pulse: (!) 55   Resp: 20   Temp: 36.6 C (P) 36.4 C    Last Pain:  Vitals:   07/24/16 0653  TempSrc: Oral         Complications: No apparent anesthesia complications

## 2016-07-24 NOTE — Anesthesia Procedure Notes (Signed)
Procedure Name: Intubation Date/Time: 07/24/2016 8:03 AM Performed by: Orlie Dakin Pre-anesthesia Checklist: Patient identified, Emergency Drugs available, Suction available and Patient being monitored Patient Re-evaluated:Patient Re-evaluated prior to inductionOxygen Delivery Method: Circle system utilized Preoxygenation: Pre-oxygenation with 100% oxygen Intubation Type: IV induction Ventilation: Mask ventilation without difficulty Laryngoscope Size: Miller and 3 Grade View: Grade I Tube type: Oral Tube size: 7.5 mm Number of attempts: 1 Airway Equipment and Method: Stylet and LTA kit utilized Placement Confirmation: ETT inserted through vocal cords under direct vision and positive ETCO2 Secured at: 22 cm Tube secured with: Tape Dental Injury: Teeth and Oropharynx as per pre-operative assessment  Comments: 2% lido used as LTA, 4x4s bite block used.

## 2016-07-24 NOTE — H&P (View-Only) (Signed)
Vascular and Vein Specialist of [facility]  Patient name: [name] MRN: [id number] DOB: 1950/06/19 Sex: female  REASON FOR CONSULT:   HPI: [name] is a 66 y.o. female, who is seen today for evaluation of critical stenosis left internal carotid artery. She is left-handed. She does have a history of prior right carotid endarterectomy after multiple episodes of amaurosis fugax by Dr. Amedeo Plenty in August 2007. She's had no further neurologic deficits since that time. She specifically denies any aphasia or unilateral weakness. Does have a history of coronary artery disease with coronary stenting in the past. She has remained stable from this. She continues to be a cigarette smoker. She does wish to quit. I again expressed the critical importance in smoking cessation for both her cardiac and peripheral vascular disease. He does have polycythemia vera and underwent a recent phlebotomy due to this.  Past Medical History:  Diagnosis Date  . Arthritis   . CAD (coronary artery disease)   . GERD (gastroesophageal reflux disease)   . Hepatitis C   . Hypertension   . Polycythemia 2009    Family History  Problem Relation Age of Onset  . Heart disease Mother   . Heart disease Father     SOCIAL HISTORY: Social History   Social History  . Marital status: Single    Spouse name: N/A  . Number of children: N/A  . Years of education: N/A   Occupational History  . Not on file.   Social History Main Topics  . Smoking status: Current Every Day Smoker    Packs/day: 1.00    Years: 48.00    Types: Cigarettes    Start date: 03/20/1968  . Smokeless tobacco: Never Used     Comment: "1 pk lasts about 3 days"  . Alcohol use No     Comment: occassionally  . Drug use: No     Comment: has smoked cigarettes off and on  . Sexual activity: Not Currently   Other Topics Concern  . Not on file   Social History Narrative   Tobacco use cigarettes: Current  smoker   Frequency 1 PPD   Estimated Pack - years :30   Smoking : yes    No Tobacco exposure   No alcohol   No exercise   Occupation : employed, works at home for [employer], Educational psychologist Status:single   Children Boys 1 Girls 1    Allergies  Allergen Reactions  . Bactrim [Sulfamethoxazole-Trimethoprim] Nausea And Vomiting  . Diphenhydramine Hcl Hives    Current Outpatient Prescriptions  Medication Sig Dispense Refill  . acetaminophen (TYLENOL) 500 MG tablet Take 500-1,000 mg by mouth every 6 (six) hours as needed for mild pain or headache.    . albuterol (PROVENTIL HFA;VENTOLIN HFA) 108 (90 BASE) MCG/ACT inhaler Inhale 2 puffs into the lungs every 6 (six) hours as needed for wheezing or shortness of breath. 1 Inhaler 2  . ALPRAZolam (XANAX) 0.5 MG tablet Take 0.5 mg by mouth at bedtime.     . clopidogrel (PLAVIX) 75 MG tablet TAKE ONE TABLET BY MOUTH ONCE DAILY 30 tablet 6  . clotrimazole-betamethasone (LOTRISONE) cream use 1 application to affected area as needed for sore areas on body    . escitalopram (LEXAPRO) 20 MG tablet Take 20 mg by mouth daily.    . hydrochlorothiazide (MICROZIDE) 12.5 MG capsule TAKE 1 CAPSULE BY MOUTH ONCE DAILY 90 capsule 3  . lansoprazole (PREVACID) 30 MG capsule Take  30 mg by mouth daily as needed (for ulcer/acid reflex). Ulcer/ acid reflux    . metoprolol succinate (TOPROL-XL) 100 MG 24 hr tablet Take 150 mg by mouth daily. Take with or immediately following a meal.    . mupirocin ointment (BACTROBAN) 2 % Apply twice a day to right arm wound 30 g 1  . nitroGLYCERIN (NITROSTAT) 0.4 MG SL tablet Place 1 tablet (0.4 mg total) under the tongue every 5 (five) minutes as needed for chest pain. 25 tablet 12  . simvastatin (ZOCOR) 10 MG tablet Take 10 mg by mouth daily.     . vitamin E 400 UNIT capsule Take 400 Units by mouth daily.     No current facility-administered medications for this visit.     REVIEW OF SYSTEMS:  [X]  denotes  positive finding, [ ]  denotes negative finding Cardiac  Comments:  Chest pain or chest pressure:    Shortness of breath upon exertion: x   Short of breath when lying flat:    Irregular heart rhythm: x       Vascular    Pain in calf, thigh, or hip brought on by ambulation: x   Pain in feet at night that wakes you up from your sleep:     Blood clot in your veins:    Leg swelling:         Pulmonary    Oxygen at home:    Productive cough:     Wheezing:         Neurologic    Sudden weakness in arms or legs:  x   Sudden numbness in arms or legs:  x   Sudden onset of difficulty speaking or slurred speech:    Temporary loss of vision in one eye:     Problems with dizziness:  x       Gastrointestinal    Blood in stool:     Vomited blood:         Genitourinary    Burning when urinating:     Blood in urine:        Psychiatric    Major depression:         Hematologic    Bleeding problems:    Problems with blood clotting too easily:        Skin    Rashes or ulcers:        Constitutional    Fever or chills:      PHYSICAL EXAM: Vitals:   07/11/16 0948 07/11/16 0955  BP: (!) 177/81 (!) 176/78  Pulse: (!) 53   Resp: 20   Temp: 98 F (36.7 C)   TempSrc: Oral   SpO2: 96%   Weight: 121 lb (54.9 kg)   Height: 5\' 1"  (1.549 m)     GENERAL: The patient is a well-nourished female, in no acute distress. The vital signs are documented above. CARDIOVASCULAR: Harsh left carotid bruit. Well-healed right carotid incision. 2+ radial and 2+ dorsalis pedis pulses bilaterally. PULMONARY: There is good air exchange  ABDOMEN: Soft and non-tender  MUSCULOSKELETAL: There are no major deformities or cyanosis. NEUROLOGIC: No focal weakness or paresthesias are detected. SKIN: There are no ulcers or rashes noted. PSYCHIATRIC: The patient has a normal affect.  DATA:  Reviewed her noninvasive studies from [place] line from March 2018. She underwent confirmatory studies in our office today  regarding her left carotid. She does have a critical stenosis greater than 80%. Her stenosis is at the internal carotid artery with a normal  internal carotid artery past this  MEDICAL ISSUES: I discussed the significance of her critical carotid stenosis with the patient. I have recommended left carotid endarterectomy for reduction of stroke risk. I explained the 1-1-1/2% risk of stroke with surgery. Explained that she would be admitted for surgery the day of and would be discharged to home on postoperative day one soon in no condition. We will hold her Plavix several days prior to the surgery. She wishes to proceed as soon as possible.   [name], MD FACS [facility]ascular and Vein Specialists of [facility] Tel [phone] Pager [phone]

## 2016-07-24 NOTE — Anesthesia Postprocedure Evaluation (Signed)
Anesthesia Post Note  Patient: [name]  Procedure(s) Performed: Procedure(s) (LRB): ENDARTERECTOMY CAROTID (Left)     Patient location during evaluation: PACU Anesthesia Type: General Level of consciousness: awake and alert Pain management: pain level controlled Vital Signs Assessment: post-procedure vital signs reviewed and stable Respiratory status: spontaneous breathing, nonlabored ventilation, respiratory function stable and patient connected to nasal cannula oxygen Cardiovascular status: blood pressure returned to baseline and stable Postop Assessment: no signs of nausea or vomiting Anesthetic complications: no    Last Vitals:  Vitals:   07/24/16 1023 07/24/16 1039  BP: (!) 176/65 (!) 161/57  Pulse: 61 60  Resp: 16 11  Temp:      Last Pain:  Vitals:   07/24/16 1052  TempSrc:   PainSc: Asleep                 Luismanuel Corman S

## 2016-07-24 NOTE — Interval H&P Note (Signed)
History and Physical Interval Note:  07/24/2016 6:52 AM  [name]  has presented today for surgery, with the diagnosis of Left Internal Carotid Artery Stenosis I65.22  The various methods of treatment have been discussed with the patient and family. After consideration of risks, benefits and other options for treatment, the patient has consented to  Procedure(s): ENDARTERECTOMY CAROTID (Left) as a surgical intervention .  The patient's history has been reviewed, patient examined, no change in status, stable for surgery.  I have reviewed the patient's chart and labs.  Questions were answered to the patient's satisfaction.     Curt Jews

## 2016-07-24 NOTE — Op Note (Signed)
    OPERATIVE REPORT  DATE OF SURGERY: 07/24/2016  PATIENT: [name], 66 y.o. female MRN: [id number]  DOB: [birth year]  PRE-OPERATIVE DIAGNOSIS: Asymptomatic left carotid stenosis  POST-OPERATIVE DIAGNOSIS:  Same  PROCEDURE: Left carotid endarterectomy and Dacron patch angioplasty  SURGEON:  Curt Jews, M.D.  PHYSICIAN ASSISTANT: Silva Bandy [facility]  ANESTHESIA:  Gen.  EBL: 50 ml  Total I/O In: 1500 [I.V.:1500] Out: 50 [Blood:50]  BLOOD ADMINISTERED: None  DRAINS: None  SPECIMEN: None  COUNTS CORRECT:  YES  PLAN OF CARE: PACU neurologically stable   PATIENT DISPOSITION:  PACU - hemodynamically stable  PROCEDURE DETAILS: The patient was taken to the operating room and placed supine position where the area of the left neck was prepped and draped in the usual sterile fashion. Incision was made anterior to the sternocleidomastoid and carried down through the platysma with electrocautery. The carotid sheath was opened. The facial vein was ligated with 2-0 silk ties and divided. The common carotid artery was encircled with an umbilical tape and Rummel tourniquet. The vagus and hypoglossal nerves were identified and preserved. The superior thyroid artery was encircled with a 2-0 silk Potts tie. The external carotid was encircled with a blue vessel loop and the internal carotid was encircled with an umbilical tape and Rummel tourniquet. The patient was given thousand intravenous heparin. After adequate circulation time the internal/external and common carotid arteries were occluded. The common carotid artery was opened with an 11 blade and extended longitudinally with Potts scissors through the plaque onto the internal carotid artery. A 10 shunt was passed up the internal carotid and allowed to back bleed and then down the common carotid where were secured with Rummel tourniquet. The endarterectomy was begun on the common carotid artery and the plaque was divided proximally with Potts  scissors. The endarterectomy was continued onto the bifurcation. The external carotid was endarterectomized with an eversion technique and the internal carotid was opened endarterectomized in an open fashion. Remaining atheromatous debris was removed from the endarterectomy plane. A Finesse Dacron Hemashield patch was brought onto the field and was sewn as a patch angioplasty with a running 6-0 Prolene suture. Prior to completion of the closure the usual flushing maneuvers were undertaken. Anastomosis was completed and clamps were removed restoring flow to. There were excellent Doppler signals and the external and internal carotid arteries. The patient was given 50 mg of protamine to reverse the heparin. Wounds were irrigated with saline. Hemostasis was obtained left cautery. Wounds were closed with 3-0 Vicryl sutures to reapproximate sternocleidomastoid over the carotid sheath. The platysma was closed with a running 3-0 Vicryl suture. The skin was closed with a 4-0 subcuticular Vicryl suture. Sterile dressing was applied. The patient was awakened neurologically intact in the operating room and transferred to the recovery room in stable condition   [name], M.D., [facility] 07/24/2016 10:29 AM

## 2016-07-24 NOTE — Anesthesia Preprocedure Evaluation (Signed)
Anesthesia Evaluation  Patient identified by MRN, date of birth, ID band Patient awake    Reviewed: Allergy & Precautions, NPO status , Patient's Chart, lab work & pertinent test results  Airway Mallampati: II  TM Distance: >3 FB Neck ROM: Full    Dental no notable dental hx.    Pulmonary COPD, Current Smoker,    Pulmonary exam normal breath sounds clear to auscultation       Cardiovascular hypertension, + CAD, + Cardiac Stents and + Peripheral Vascular Disease  Normal cardiovascular exam Rhythm:Regular Rate:Normal     Neuro/Psych negative neurological ROS  negative psych ROS   GI/Hepatic negative GI ROS, Neg liver ROS,   Endo/Other  negative endocrine ROS  Renal/GU negative Renal ROS  negative genitourinary   Musculoskeletal negative musculoskeletal ROS (+)   Abdominal   Peds negative pediatric ROS (+)  Hematology negative hematology ROS (+)   Anesthesia Other Findings   Reproductive/Obstetrics negative OB ROS                             Anesthesia Physical Anesthesia Plan  ASA: III  Anesthesia Plan: General   Post-op Pain Management:    Induction: Intravenous  Airway Management Planned: Oral ETT  Additional Equipment: Arterial line  Intra-op Plan:   Post-operative Plan: Extubation in OR  Informed Consent: I have reviewed the patients History and Physical, chart, labs and discussed the procedure including the risks, benefits and alternatives for the proposed anesthesia with the patient or authorized representative who has indicated his/her understanding and acceptance.   Dental advisory given  Plan Discussed with: CRNA and Surgeon  Anesthesia Plan Comments:         Anesthesia Quick Evaluation

## 2016-07-25 ENCOUNTER — Encounter (HOSPITAL_COMMUNITY): Payer: Self-pay | Admitting: Vascular Surgery

## 2016-07-25 LAB — BASIC METABOLIC PANEL
Anion gap: 8 (ref 5–15)
BUN: 7 mg/dL (ref 6–20)
CALCIUM: 8.1 mg/dL — AB (ref 8.9–10.3)
CO2: 30 mmol/L (ref 22–32)
Chloride: 98 mmol/L — ABNORMAL LOW (ref 101–111)
Creatinine, Ser: 0.78 mg/dL (ref 0.44–1.00)
Glucose, Bld: 98 mg/dL (ref 65–99)
POTASSIUM: 3.8 mmol/L (ref 3.5–5.1)
Sodium: 136 mmol/L (ref 135–145)

## 2016-07-25 LAB — CBC
HEMATOCRIT: 32.1 % — AB (ref 36.0–46.0)
Hemoglobin: 10.3 g/dL — ABNORMAL LOW (ref 12.0–15.0)
MCH: 29.6 pg (ref 26.0–34.0)
MCHC: 32.1 g/dL (ref 30.0–36.0)
MCV: 92.2 fL (ref 78.0–100.0)
PLATELETS: 167 10*3/uL (ref 150–400)
RBC: 3.48 MIL/uL — AB (ref 3.87–5.11)
RDW: 13.4 % (ref 11.5–15.5)
WBC: 6.2 10*3/uL (ref 4.0–10.5)

## 2016-07-25 MED ORDER — OXYCODONE-ACETAMINOPHEN 5-325 MG PO TABS: 1.0000 | ORAL_TABLET | Freq: Four times a day (QID) | ORAL | 0 refills | Status: DC | PRN

## 2016-07-25 NOTE — Discharge Instructions (Signed)
Carotid Endarterectomy A carotid endarterectomy is a surgery to remove a blockage in the carotid arteries. The carotid arteries are the large blood vessels on both sides of the neck that supply blood to the brain. Carotid artery disease, also called carotid artery stenosis, is the narrowing or blockage of one or both carotid arteries. Carotid artery disease is usually caused by atherosclerosis, which is a buildup of fat and plaques in the arteries. Some buildup of plaques normally occurs with aging. The plaques may partially or totally block blood flow or cause a clot to form in the carotid arteries. This may cause a stroke. Tell a health care provider about:  Any allergies you have.  All medicines you are taking, including vitamins, herbs, eye drops, creams, and over-the-counter medicines.  Any problems you or family members have had with anesthetic medicines.  Any blood disorders you have.  Any surgeries you have had.  Any medical conditions you have, including diabetes, kidney problems, and infections.  Whether you are pregnant or may be pregnant. What are the risks? Generally, this is a safe procedure. However, problems may occur, including:  Infection.  Bleeding.  Blood clots.  Allergic reactions to medicines.  Damage to nerves near the carotid arteries. This can cause a hoarse voice or weakness of muscles your face.  Stroke.  Seizures.  Heart attack (myocardial infarction).  Narrowing of the opened blood vessel (re-stenosis) . This may require another surgery.  What happens before the procedure? Medicines  Ask your health care provider about: ? Changing or stopping your regular medicines. This is especially important if you are taking diabetes medicines or blood thinners. ? Taking medicines such as aspirin and ibuprofen. These medicines can thin your blood. Do not take these medicines before your procedure if your health care provider instructs you not to.  You may  be given antibiotic medicine to help prevent infection. Staying hydrated Follow instructions from your health care provider about hydration, which may include:  Up to 2 hours before the procedure - you may continue to drink clear liquids, such as water, clear fruit juice, black coffee, and plain tea.  Eating and drinking restrictions Follow instructions from your health care provider about eating and drinking, which may include:  8 hours before the procedure - stop eating heavy meals or foods such as meat, fried foods, or fatty foods.  6 hours before the procedure - stop eating light meals or foods, such as toast or cereal.  6 hours before the procedure - stop drinking milk or drinks that contain milk.  2 hours before the procedure - stop drinking clear liquids.  General instructions  Do not use any products that contain nicotine or tobacco, such as cigarettes and e-cigarettes. These can delay healing after surgery. If you need help quitting, ask your health care provider.  You may need to have blood tests, a test to check heart rhythm (electrocardiogram), or a test to check blood flow (angiogram).  Plan to have someone take you home from the hospital or clinic.  Ask your health care provider how your surgical site will be marked or identified.  You may be asked to shower with a germ-killing soap. What happens during the procedure?  To lower your risk of infection: ? Your health care team will wash or sanitize their hands. ? Your skin will be washed with soap. ? Hair may be removed from the surgical area.  An IV tube will be inserted into one of your veins.  You   will be given one or more of the following: ? A medicine to help you relax (sedative). ? A medicine to make you fall asleep (general anesthetic).  The surgeon will make a small incision in your neck to expose the carotid artery.  A tube may be inserted into the carotid artery above and below the blockage. This tube  will temporarily allow blood to flow around the blockage during the surgery.  An incision will be made in the carotid artery at the location of the blockage, then the blockage will be removed. In some cases, a section of the carotid artery may be removed and a graft patch may be used to repair the artery.  The carotid artery will be closed with stitches (sutures).  If a tube was inserted into the artery to allow blood flow around the blockage during surgery, the tube will be removed. With the tube removed, blood flow to the brain will be restored through the carotid artery.  The incision in the neck will be closed with sutures.  A bandage (dressing) will be placed over your incision. The procedure may vary among health care providers and hospitals. What happens after the procedure?  Your blood pressure, heart rate, breathing rate, and blood oxygen level will be monitored until the medicines you were given have worn off.  You may have some pain or an ache in your neck for a couple weeks. This is normal.  Do not drive for 24 hours if you were given a sedative. Summary  A carotid endarterectomy is a surgery to remove a blockage in the carotid arteries.  The carotid arteries are the large blood vessels on both sides of the neck that supply blood to the brain.  Before the procedure, ask your health care provider about changing or stopping your regular medicines.  Follow instructions from your health care provider about eating and drinking before the procedure.  After the procedure, do not drive for 24 hours if you were given a sedative. This information is not intended to replace advice given to you by your health care provider. Make sure you discuss any questions you have with your health care provider. Document Released: 10/09/2012 Document Revised: 12/27/2015 Document Reviewed: 12/27/2015 Elsevier Interactive Patient Education  2017 [employer]  

## 2016-07-25 NOTE — Progress Notes (Signed)
Discharge instructions and prescriptions given to patient and grandsons. Patient being discharged to home. Belongings and follow-up appointment information given to patient.

## 2016-07-25 NOTE — Care Management Note (Signed)
Case Management Note  Patient Details  Name: [name] MRN: [id number] Date of Birth: 1950/09/22  Subjective/Objective:  From home, s/p CEA, for dc today, no needs.                  Action/Plan:   Expected Discharge Date:  07/25/16               Expected Discharge Plan:  Home/Self Care  In-House Referral:     Discharge planning Services  CM Consult  Post Acute Care Choice:    Choice offered to:     DME Arranged:    DME Agency:     HH Arranged:    HH Agency:     Status of Service:  Completed, signed off  If discussed at [employer] of Stay Meetings, dates discussed:    Additional Comments:  Zenon Mayo, RN 07/25/2016, 10:00 AM

## 2016-07-25 NOTE — Progress Notes (Addendum)
[facility] [place]  Subjective  - Doing well over all, complaints of sore throat and incisional pain.   Objective 103/68 (!) 50 98.2 F (36.8 C) (Oral) 14 99%  Intake/Output Summary (Last 24 hours) at 07/25/16 0718 Last data filed at 07/25/16 0400  Gross per 24 hour  Intake             2905 ml  Output             1750 ml  Net             1155 ml    Left neck incision with ecchymosis, soft without hematoma. Grip 5/5 B UE No tongue deviation, smile symmetric Heart RRR Lungs non labored breathing  Assessment/Planning: POD #1 CEA  Doing well, ambulated, voided and tolerating PO's F/U in 2-3 weeks with Dr. Tonie Griffith, EMMA [facility] 07/25/2016 7:18 AM -- I have examined the patient, reviewed and agree with above.Comfortable. Neurologically intact. Okay for discharge today  Curt Jews, MD 07/25/2016 8:49 AM   Laboratory Lab Results:  Recent Labs  07/24/16 1746 07/25/16 0551  WBC 8.6 6.2  HGB 10.5* 10.3*  HCT 32.9* 32.1*  PLT 183 167   BMET  Recent Labs  07/24/16 1746 07/25/16 0551  NA  --  136  K  --  3.8  CL  --  98*  CO2  --  30  GLUCOSE  --  98  BUN  --  7  CREATININE 0.74 0.78  CALCIUM  --  8.1*    COAG Lab Results  Component Value Date   INR 1.01 07/20/2016   INR 0.93 10/30/2011   No results found for: PTT

## 2016-07-27 ENCOUNTER — Encounter: Payer: Self-pay | Admitting: Vascular Surgery

## 2016-07-27 NOTE — Discharge Summary (Signed)
[facility] Discharge Summary   Patient ID:  [name] MRN: [id number] DOB/AGE: [birth year] 66 y.o.  Admit date: 07/24/2016 Discharge date: 07/25/2016 Date of Surgery: 07/24/2016 Surgeon: Juliann Mule): Early, Arvilla Meres, MD  Admission Diagnosis: Left Internal Carotid Artery Stenosis I65.22  Discharge Diagnoses:  Left Internal Carotid Artery Stenosis I65.22  Secondary Diagnoses: Past Medical History:  Diagnosis Date  . Anxiety   . Arthritis   . CAD (coronary artery disease)   . COPD (chronic obstructive pulmonary disease) ([place])   . Depression   . Dyspnea   . GERD (gastroesophageal reflux disease)   . Hepatitis C   . Hypertension   . Peripheral vascular disease ([place])   . Polycythemia 2009  . Seizures ([place]) 1970   x1- pt. reports that there was never any causative agent      Procedure(s): ENDARTERECTOMY CAROTID  Discharged Condition: good  HPI: [name] is a 66 y.o. female, who is seen today for evaluation of critical stenosis left internal carotid artery. She is left-handed. She does have a history of prior right carotid endarterectomy after multiple episodes of amaurosis fugax by Dr. Amedeo Plenty in August 2007. She's had no further neurologic deficits since that time. She specifically denies any aphasia or unilateral weakness. Does have a history of coronary artery disease with coronary stenting in the past. She has remained stable from this. She continues to be a cigarette smoker. She does wish to quit. I again expressed the critical importance in smoking cessation for both her cardiac and peripheral vascular disease. He does have polycythemia vera and underwent a recent phlebotomy due to this.  DATA:  Reviewed her noninvasive studies from [place] line from March 2018. She underwent confirmatory studies in our office today regarding her left carotid. She does have a critical stenosis greater than 80%. Her stenosis is at the internal carotid artery with a normal  internal carotid artery past this  Hospital Course:  [name] is a 66 y.o. female is S/P Left Procedure(s): ENDARTERECTOMY CAROTID  She had an uneventful stay without complications.   Significant Diagnostic Studies: CBC Lab Results  Component Value Date   WBC 6.2 07/25/2016   HGB 10.3 (L) 07/25/2016   HCT 32.1 (L) 07/25/2016   MCV 92.2 07/25/2016   PLT 167 07/25/2016    BMET    Component Value Date/Time   NA 136 07/25/2016 0551   NA 132 (L) 06/28/2016 1357   NA 136 12/28/2015 1311   K 3.8 07/25/2016 0551   K 5.2 06/28/2016 1357   K 4.9 12/28/2015 1311   CL 98 (L) 07/25/2016 0551   CL 95 (L) 06/28/2016 1357   CL 100 06/17/2014 1036   CO2 30 07/25/2016 0551   CO2 32 (H) 06/28/2016 1357   CO2 28 12/28/2015 1311   GLUCOSE 98 07/25/2016 0551   GLUCOSE 124 12/28/2015 1311   GLUCOSE 106 06/17/2014 1036   BUN 7 07/25/2016 0551   BUN 11 06/28/2016 1357   BUN 10.4 12/28/2015 1311   CREATININE 0.78 07/25/2016 0551   CREATININE 0.74 06/28/2016 1357   CREATININE 0.9 12/28/2015 1311   CALCIUM 8.1 (L) 07/25/2016 0551   CALCIUM 10.2 06/28/2016 1357   CALCIUM 9.6 12/28/2015 1311   GFRNONAA >60 07/25/2016 0551   GFRAA >60 07/25/2016 0551   COAG Lab Results  Component Value Date   INR 1.01 07/20/2016   INR 0.93 10/30/2011     Disposition:  Discharge to :Home Discharge Instructions  Call MD for:  redness, tenderness, or signs of infection (pain, swelling, bleeding, redness, odor or green/yellow discharge around incision site)    Complete by:  As directed    Call MD for:  severe or increased pain, loss or decreased feeling  in affected limb(s)    Complete by:  As directed    Call MD for:  temperature >100.5    Complete by:  As directed    Discharge instructions    Complete by:  As directed    You may remove all dressings and shower tonight.  Gradually get back into your daily activity.  No heavy lifting for the next 2-3 weeks.  No driving for 1 week.    Discharge patient    Complete by:  As directed    Discharge disposition:  01-Home or Self Care   Discharge patient date:  07/25/2016   Driving Restrictions    Complete by:  As directed    No driving for 1 week   Increase activity slowly    Complete by:  As directed    Walk with assistance use walker or cane as needed   Lifting restrictions    Complete by:  As directed    No lifting for 2-3 weeks   Resume previous diet    Complete by:  As directed      Allergies as of 07/25/2016      Reactions   Diphenhydramine Hcl Hives   Bactrim [sulfamethoxazole-trimethoprim] Nausea And Vomiting      Medication List    TAKE these medications   acetaminophen 500 MG tablet Commonly known as:  TYLENOL Take 500-1,000 mg by mouth every 6 (six) hours as needed for mild pain or headache (depends on pain if takes 1-2 tablets).   albuterol 108 (90 Base) MCG/ACT inhaler Commonly known as:  PROVENTIL HFA;VENTOLIN HFA Inhale 2 puffs into the lungs every 6 (six) hours as needed for wheezing or shortness of breath.   ALPRAZolam 0.5 MG tablet Commonly known as:  XANAX Take 0.5 mg by mouth at bedtime.   aspirin EC 81 MG tablet Take 81 mg by mouth daily.   Biotin 1000 MCG tablet Take 1,000 mcg by mouth daily.   clopidogrel 75 MG tablet Commonly known as:  PLAVIX TAKE ONE TABLET BY MOUTH ONCE DAILY   clotrimazole-betamethasone cream Commonly known as:  LOTRISONE use 1 application to affected area as needed for sore areas on body   escitalopram 20 MG tablet Commonly known as:  LEXAPRO Take 20 mg by mouth daily.   hydrochlorothiazide 12.5 MG capsule Commonly known as:  MICROZIDE TAKE 1 CAPSULE BY MOUTH ONCE DAILY   lansoprazole 30 MG capsule Commonly known as:  PREVACID Take 30 mg by mouth daily as needed (for ulcer/acid reflex). Ulcer/ acid reflux   metoprolol succinate 100 MG 24 hr tablet Commonly known as:  TOPROL-XL Take 150 mg by mouth daily. Takes 1.5 tablet   metoprolol succinate  100 MG 24 hr tablet Commonly known as:  TOPROL-XL TAKE ONE-HALF & ONE WHOLE TABLET BY MOUTH ONCE DAILY   mupirocin ointment 2 % Commonly known as:  BACTROBAN Apply twice a day to right arm wound What changed:  how much to take  how to take this  when to take this  reasons to take this  additional instructions   nitroGLYCERIN 0.4 MG SL tablet Commonly known as:  NITROSTAT Place 1 tablet (0.4 mg total) under the tongue every 5 (five) minutes as needed for chest pain.  oxyCODONE-acetaminophen 5-325 MG tablet Commonly known as:  PERCOCET/ROXICET Take 1 tablet by mouth every 6 (six) hours as needed for moderate pain.   simvastatin 10 MG tablet Commonly known as:  ZOCOR Take 10 mg by mouth every other day.   VITAMIN C PO Take 2 tablets by mouth daily.   vitamin E 400 UNIT capsule Take 400 Units by mouth daily.      Verbal and written Discharge instructions given to the patient. Wound care per Discharge AVS   Signed: Laurence Slate [facility] 07/27/2016, 11:11 AM --- For VQI Registry use --- Instructions: Press F2 to tab through selections.  Delete question if not applicable.   Modified Rankin score at D/C (0-6): Rankin Score=0  IV medication needed for:  1. Hypertension: No 2. Hypotension: No  Post-op Complications: No  1. Post-op CVA or TIA: No  If yes: Event classification (right eye, left eye, right cortical, left cortical, verterobasilar, other):   If yes: Timing of event (intra-op, <6 hrs post-op, >=6 hrs post-op, unknown):   2. CN injury: No  If yes: CN  injuried   3. Myocardial infarction: No  If yes: Dx by (EKG or clinical, Troponin):   4.  CHF: No  5.  Dysrhythmia (new): No  6. Wound infection: No  7. Reperfusion symptoms: No  8. Return to OR: No  If yes: return to OR for (bleeding, neurologic, other CEA incision, other):   Discharge medications: Statin use:  Yes ASA use:  Yes Beta blocker use:  Yes ACE-Inhibitor use:  No  for  medical reason   P2Y12 Antagonist use: [ ]  None, [x ] Plavix, [ ]  Plasugrel, [ ]  Ticlopinine, [ ]  Ticagrelor, [ ]  Other, [ ]  No for medical reason, [ ]  Non-compliant, [ ]  Not-indicated Anti-coagulant use:  [x ] None, [ ]  Warfarin, [ ]  Rivaroxaban, [ ]  Dabigatran, [ ]  Other, [ ]  No for medical reason, [ ]  Non-compliant, [ ]  Not-indicated

## 2016-07-28 ENCOUNTER — Ambulatory Visit (INDEPENDENT_AMBULATORY_CARE_PROVIDER_SITE_OTHER): Payer: Self-pay | Admitting: Vascular Surgery

## 2016-07-28 ENCOUNTER — Encounter: Payer: Self-pay | Admitting: Vascular Surgery

## 2016-07-28 VITALS — BP 130/74 | HR 62 | Temp 98.1°F | Resp 16 | Ht 60.5 in | Wt 116.0 lb

## 2016-07-28 DIAGNOSIS — Z9889 Other specified postprocedural states: Secondary | ICD-10-CM

## 2016-07-28 NOTE — Progress Notes (Signed)
Patient name: [name] MRN: [id number] DOB: 21-Jun-1950 Sex: female  REASON FOR VISIT: add-on  HPI: [name] is a 66 y.o. female who presents with complains of left sided ache and swelling around left neck. Underwent left carotid endarterectomy 4 days ago on 07/24/16 for asymptomatic carotid stenosis with Dr. Donnetta Hutching. Patient was discharged on POD 1. Had some throat pain and left tooth pain on POD 1. Did not have issues swallowing at time of discharge. Was started back on Plavix on POD 1. On post op day 2, the patient reports having to crush her meds. Her swallowing issus have now resolved. Has also complained of the left bottom four teeth hurting, in addition to pain in left ear when swallowing. Also with bruising to right neck, upper chest and back.   Had right carotid endarterectomy 11 years ago with Dr. Amedeo Plenty and reported no issues. Is concerned given all these symptoms now.    Current Outpatient Prescriptions  Medication Sig Dispense Refill  . acetaminophen (TYLENOL) 500 MG tablet Take 500-1,000 mg by mouth every 6 (six) hours as needed for mild pain or headache (depends on pain if takes 1-2 tablets).     Marland Kitchen albuterol (PROVENTIL HFA;VENTOLIN HFA) 108 (90 BASE) MCG/ACT inhaler Inhale 2 puffs into the lungs every 6 (six) hours as needed for wheezing or shortness of breath. 1 Inhaler 2  . ALPRAZolam (XANAX) 0.5 MG tablet Take 0.5 mg by mouth at bedtime.     . Ascorbic Acid (VITAMIN C PO) Take 2 tablets by mouth daily.    Marland Kitchen aspirin EC 81 MG tablet Take 81 mg by mouth daily.    . Biotin 1000 MCG tablet Take 1,000 mcg by mouth daily.    . clopidogrel (PLAVIX) 75 MG tablet TAKE ONE TABLET BY MOUTH ONCE DAILY 30 tablet 6  . clotrimazole-betamethasone (LOTRISONE) cream use 1 application to affected area as needed for sore areas on body    . escitalopram (LEXAPRO) 20 MG tablet Take 20 mg by mouth daily.    . hydrochlorothiazide (MICROZIDE) 12.5 MG capsule TAKE 1 CAPSULE BY MOUTH ONCE  DAILY 90 capsule 3  . lansoprazole (PREVACID) 30 MG capsule Take 30 mg by mouth daily as needed (for ulcer/acid reflex). Ulcer/ acid reflux    . metoprolol succinate (TOPROL-XL) 100 MG 24 hr tablet Take 150 mg by mouth daily. Takes 1.5 tablet    . metoprolol succinate (TOPROL-XL) 100 MG 24 hr tablet TAKE ONE-HALF & ONE WHOLE TABLET BY MOUTH ONCE DAILY 45 tablet 2  . mupirocin ointment (BACTROBAN) 2 % Apply twice a day to right arm wound (Patient taking differently: Apply 1 application topically as needed. ) 30 g 1  . nitroGLYCERIN (NITROSTAT) 0.4 MG SL tablet Place 1 tablet (0.4 mg total) under the tongue every 5 (five) minutes as needed for chest pain. 25 tablet 12  . oxyCODONE-acetaminophen (PERCOCET/ROXICET) 5-325 MG tablet Take 1 tablet by mouth every 6 (six) hours as needed for moderate pain. 6 tablet 0  . simvastatin (ZOCOR) 10 MG tablet Take 10 mg by mouth every other day.     . vitamin E 400 UNIT capsule Take 400 Units by mouth daily.     No current facility-administered medications for this visit.     REVIEW OF SYSTEMS:  [X]  denotes positive finding, [ ]  denotes negative finding Cardiac  Comments:  Chest pain or chest pressure:    Shortness of breath upon exertion:    Short of breath when lying  flat:    Irregular heart rhythm:    Constitutional    Fever or chills:      PHYSICAL EXAM: Vitals:   07/28/16 1308 07/28/16 1309  BP: (!) 156/79 130/74  Pulse: 62   Resp: 16   Temp: 98.1 F (36.7 C)   SpO2: 95%   Weight: 116 lb (52.6 kg)   Height: 5' 0.5" (1.537 m)     GENERAL: The patient is a well-nourished female, in no acute distress. The vital signs are documented above. HEENT: Left bottom teeth are intact without subluxation. Non tender to touch. Gums are pink.  VASCULAR: Left neck incision intact with mild hematoma and ecchymosis surrounding incision. Some ecchymosis of right neck as well as upper chest and upper back.  NEURO: no facial asymmetry. Tongue midline. 5/5  strength upper and lower extremities bilaterally.    MEDICAL ISSUES: S/p left carotid endarterectomy 07/24/16 Left sided headache  Patient is neuro intact. Mild hematoma left neck. Only on Plavix.  Initially had issue with swallowing two days post op that has now resolved. Has had headache since discharge. No issues with BP while in the hospital. BP 156/79 today. Has ecchymosis contralateral side in addition to upper chest and back. Also pain with last 4 bottom teeth on left side. Teeth are not loose. Discussed that these symptoms are unusual.   Will have patient follow-up with Dr. Donnetta Hutching on Tuesday. Discussed that if she were to develop worsening headache to call our office over the weekend.   Virgina Jock, PA-C [facility] MD: Bridgett Larsson  Addendum  I have independently interviewed and examined the patient, and I agree with the physician assistant's findings.  Pt has minimal hematoma in L neck but strangely has draining echymosis down bilateral shoulders (L>>R).  Neuro status is intact as reportedly is swallow though she does complain of pain with swallowing.  She also is complains of some mild-mod headache on the Left side.     D/W pt further titration of her BP with her PCP  If her HA increase, she may need to be observed in the hospital for cerebral hyperperfusion.  Will arrange follow this coming week.  Adele Barthel, MD, FACS [facility] Office: [phone] Pager: [phone]  07/28/2016, 5:37 PM

## 2016-08-01 ENCOUNTER — Ambulatory Visit (INDEPENDENT_AMBULATORY_CARE_PROVIDER_SITE_OTHER): Payer: Self-pay | Admitting: Vascular Surgery

## 2016-08-01 ENCOUNTER — Encounter: Payer: Self-pay | Admitting: Vascular Surgery

## 2016-08-01 ENCOUNTER — Other Ambulatory Visit: Payer: Self-pay

## 2016-08-01 ENCOUNTER — Ambulatory Visit (HOSPITAL_COMMUNITY)
Admission: RE | Admit: 2016-08-01 | Discharge: 2016-08-01 | Disposition: A | Discharge status: Home / Self Care | Payer: PPO | Source: Ambulatory Visit | Attending: Vascular Surgery | Admitting: Vascular Surgery

## 2016-08-01 VITALS — BP 150/68 | HR 40 | Temp 99.0°F | Resp 16 | Ht 60.5 in | Wt 119.9 lb

## 2016-08-01 DIAGNOSIS — Z48812 Encounter for surgical aftercare following surgery on the circulatory system: Secondary | ICD-10-CM | POA: Diagnosis not present

## 2016-08-01 DIAGNOSIS — I739 Peripheral vascular disease, unspecified: Secondary | ICD-10-CM

## 2016-08-01 DIAGNOSIS — Z9889 Other specified postprocedural states: Secondary | ICD-10-CM

## 2016-08-01 LAB — VAS US CAROTID
LEFT ECA DIAS: -36 cm/s
LEFT VERTEBRAL DIAS: 21 cm/s
LICADDIAS: -28 cm/s
LICADSYS: -108 cm/s
LICAPDIAS: -21 cm/s
LICAPSYS: -103 cm/s
Left CCA dist dias: -19 cm/s
Left CCA dist sys: -85 cm/s
Left CCA prox dias: 22 cm/s
Left CCA prox sys: 100 cm/s

## 2016-08-01 NOTE — Progress Notes (Signed)
POST OPERATIVE OFFICE NOTE    CC:  F/u for surgery  HPI:  This is a 66 y.o. female who is s/p who is s/p left carotid endarterectomy by Dr. Donnetta Hutching on 07/24/16.  She came in last Friday with c/o left sided headache (states she does have right sided headaches occasionally), soreness in her bottom teeth on the left, ear pain when swallowing and bruising on her neck, back and chest.    She states that she continues to have left sided headaches and is taking Tylenol tid.  She continues to have a sore throat, but no difficulty swallowing.  Her bottom teeth on the left are still sore and she still has the ear pain when swallowing.  She states now that some of the feeling is coming back, she has some soreness in her jaw.  Her bruising has improved but still has some on her left shoulder.    She states that she did have some right arm numbness a couple of days ago that lasted about 5 minutes.  She states that she "shook it off" and could've been the way she was laying.    She is on a beta blocker for hypertension.  She is on Plavix daily.  She is on a statin.    Allergies  Allergen Reactions  . Diphenhydramine Hcl Hives  . Bactrim [Sulfamethoxazole-Trimethoprim] Nausea And Vomiting    Current Outpatient Prescriptions  Medication Sig Dispense Refill  . acetaminophen (TYLENOL) 500 MG tablet Take 500-1,000 mg by mouth every 6 (six) hours as needed for mild pain or headache (depends on pain if takes 1-2 tablets).     Marland Kitchen albuterol (PROVENTIL HFA;VENTOLIN HFA) 108 (90 BASE) MCG/ACT inhaler Inhale 2 puffs into the lungs every 6 (six) hours as needed for wheezing or shortness of breath. 1 Inhaler 2  . ALPRAZolam (XANAX) 0.5 MG tablet Take 0.5 mg by mouth at bedtime.     . Ascorbic Acid (VITAMIN C PO) Take 2 tablets by mouth daily.    Marland Kitchen aspirin EC 81 MG tablet Take 81 mg by mouth daily.    . Biotin 1000 MCG tablet Take 1,000 mcg by mouth daily.    . clopidogrel (PLAVIX) 75 MG tablet TAKE ONE TABLET BY MOUTH  ONCE DAILY 30 tablet 6  . clotrimazole-betamethasone (LOTRISONE) cream use 1 application to affected area as needed for sore areas on body    . escitalopram (LEXAPRO) 20 MG tablet Take 20 mg by mouth daily.    . hydrochlorothiazide (MICROZIDE) 12.5 MG capsule TAKE 1 CAPSULE BY MOUTH ONCE DAILY 90 capsule 3  . lansoprazole (PREVACID) 30 MG capsule Take 30 mg by mouth daily as needed (for ulcer/acid reflex). Ulcer/ acid reflux    . metoprolol succinate (TOPROL-XL) 100 MG 24 hr tablet Take 150 mg by mouth daily. Takes 1.5 tablet    . metoprolol succinate (TOPROL-XL) 100 MG 24 hr tablet TAKE ONE-HALF & ONE WHOLE TABLET BY MOUTH ONCE DAILY 45 tablet 2  . mupirocin ointment (BACTROBAN) 2 % Apply twice a day to right arm wound (Patient taking differently: Apply 1 application topically as needed. ) 30 g 1  . nitroGLYCERIN (NITROSTAT) 0.4 MG SL tablet Place 1 tablet (0.4 mg total) under the tongue every 5 (five) minutes as needed for chest pain. 25 tablet 12  . simvastatin (ZOCOR) 10 MG tablet Take 10 mg by mouth every other day.     . vitamin E 400 UNIT capsule Take 400 Units by mouth daily.  No current facility-administered medications for this visit.      ROS:  See HPI  Physical Exam:  Vitals:   08/01/16 1424 08/01/16 1430  BP: (!) 157/75 (!) 150/68  Pulse: (!) 40   Resp: 16   Temp: 99 F (37.2 C)     Incision:  No hematoma present.  There is dermabond over the incision  Extremities:  Moving all extremities equally Neuro: in tact.   Assessment/Plan:  This is a 66 y.o. female who is s/p: Left carotid endarterectomy on 07/24/16 by Dr. Donnetta Hutching   -pt returns today with c/o of left sided headaches daily (but also some right sided headaches), jaw soreness and teeth pain, and ear pain with swallowing all of which Dr. Donnetta Hutching assured pt these would resolve.  -she did have a transient episode of right arm numbness a couple of days ago that lasted about 5 minutes, which is more concerning.  She has  not had any visual disturbances, speech problems or any other numbness or weakness on the right side.  We will get a duplex of the left carotid artery today.  -Duplex revealed no significant left ICA stenosis.  No significant plaque or hyperplasia visualized in the left carotid endarterectomy site.  Dr. Donnetta Hutching discussed with pt and let her know that if she develops any further issues with numbness or paralysis or speech or visual difficulties, to contact us.  Otherwise, she will f/u in 6 months with carotid duplex. -Her HR at initial evaluation was 40bpm and she is on Metoprolol-when examined by Dr. Donnetta Hutching, heart rate had improved. -pt reassured about her incision with the Dermabond.  No evidence of infection and is healing nicely.   Leontine Locket, PA-C [facility] [phone]  Clinic MD:  Pt seen and examined with Dr. Donnetta Hutching    I have examined the patient, reviewed and agree with above. Multiple issues as discussed above with soreness and headache. She actually looks quite good today. She is in great spirits and is smiling and very comfortable. I explained that all these issues should be self-limited and that will go away. She is actually asking when she can lift and no anterior usual activities. Explained that she should be fine with minimal limitation but should begin gradually. Was concern regarding the episode of transient numbness in her right arm. This did not sound classic but was concerning enough that we did obtain a duplex which was a completely normal and her left internal carotid artery. She will continue her Plavix. She will notify should she develop any new deficits. Otherwise we will see her in 6 months with repeat duplex  Curt Jews, MD 08/01/2016 4:18 PM

## 2016-08-11 NOTE — Addendum Note (Signed)
Addended by: Lianne Cure A on: 08/11/2016 01:49 PM   Modules accepted: Orders

## 2016-09-05 ENCOUNTER — Other Ambulatory Visit: Payer: Self-pay | Admitting: *Deleted

## 2016-09-05 ENCOUNTER — Telehealth: Payer: Self-pay | Admitting: *Deleted

## 2016-09-05 DIAGNOSIS — D751 Secondary polycythemia: Secondary | ICD-10-CM

## 2016-09-05 NOTE — Telephone Encounter (Signed)
Patient c/o fatigue and having multiple large bruises all over her body. She mentions being on plavix since having vascular surgery. She would like to have her labs checked to evaluate need for phlebotomy.  Appointment made to check labs this week. Reviewed symptoms related to polycythemia and that the bruising wasn't likely related to this diagnosis but may be related to the plavix. Instructed patient to call the MD who manages plavix to see about possible dose adjustment or discontinuation. She agreed to follow up with managing MD.

## 2016-09-06 DIAGNOSIS — Z1231 Encounter for screening mammogram for malignant neoplasm of breast: Secondary | ICD-10-CM | POA: Diagnosis not present

## 2016-09-06 DIAGNOSIS — Z01419 Encounter for gynecological examination (general) (routine) without abnormal findings: Secondary | ICD-10-CM | POA: Diagnosis not present

## 2016-09-07 ENCOUNTER — Other Ambulatory Visit (HOSPITAL_BASED_OUTPATIENT_CLINIC_OR_DEPARTMENT_OTHER): Payer: PPO

## 2016-09-07 ENCOUNTER — Ambulatory Visit: Payer: PPO

## 2016-09-07 ENCOUNTER — Other Ambulatory Visit: Payer: Self-pay | Admitting: Hematology & Oncology

## 2016-09-07 DIAGNOSIS — D751 Secondary polycythemia: Secondary | ICD-10-CM | POA: Diagnosis not present

## 2016-09-07 DIAGNOSIS — R58 Hemorrhage, not elsewhere classified: Secondary | ICD-10-CM | POA: Diagnosis not present

## 2016-09-07 LAB — COMPREHENSIVE METABOLIC PANEL (CC13)
A/G RATIO: 1.6 (ref 1.2–2.2)
ALT: 9 IU/L (ref 0–32)
AST: 20 IU/L (ref 0–40)
Albumin, Serum: 4.2 g/dL (ref 3.6–4.8)
Alkaline Phosphatase, S: 77 IU/L (ref 39–117)
BUN/Creatinine Ratio: 13 (ref 12–28)
BUN: 10 mg/dL (ref 8–27)
Bilirubin Total: 0.4 mg/dL (ref 0.0–1.2)
CALCIUM: 9.7 mg/dL (ref 8.7–10.3)
CHLORIDE: 98 mmol/L (ref 96–106)
Carbon Dioxide, Total: 29 mmol/L (ref 20–29)
Creatinine, Ser: 0.78 mg/dL (ref 0.57–1.00)
GFR, EST AFRICAN AMERICAN: 92 mL/min/{1.73_m2} (ref >59)
GFR, EST NON AFRICAN AMERICAN: 79 mL/min/{1.73_m2} (ref >59)
GLUCOSE: 142 mg/dL — AB (ref 65–99)
Globulin, Total: 2.7 g/dL (ref 1.5–4.5)
POTASSIUM: 4.6 mmol/L (ref 3.5–5.2)
Sodium: 135 mmol/L (ref 134–144)
TOTAL PROTEIN: 6.9 g/dL (ref 6.0–8.5)

## 2016-09-07 LAB — CBC WITH DIFFERENTIAL (CANCER CENTER ONLY)
BASO#: 0 10*3/uL (ref 0.0–0.2)
BASO%: 0.3 % (ref 0.0–2.0)
EOS%: 0.5 % (ref 0.0–7.0)
Eosinophils Absolute: 0 10*3/uL (ref 0.0–0.5)
HCT: 38.4 % (ref 34.8–46.6)
HGB: 12.6 g/dL (ref 11.6–15.9)
LYMPH#: 1.9 10*3/uL (ref 0.9–3.3)
LYMPH%: 31.1 % (ref 14.0–48.0)
MCH: 29.3 pg (ref 26.0–34.0)
MCHC: 32.8 g/dL (ref 32.0–36.0)
MCV: 89 fL (ref 81–101)
MONO#: 0.6 10*3/uL (ref 0.1–0.9)
MONO%: 9.5 % (ref 0.0–13.0)
NEUT#: 3.7 10*3/uL (ref 1.5–6.5)
NEUT%: 58.6 % (ref 39.6–80.0)
PLATELETS: 257 10*3/uL (ref 145–400)
RBC: 4.3 10*6/uL (ref 3.70–5.32)
RDW: 12.9 % (ref 11.1–15.7)
WBC: 6.2 10*3/uL (ref 3.9–10.0)

## 2016-09-08 LAB — IRON AND TIBC
%SAT: 9 % — ABNORMAL LOW (ref 21–57)
IRON: 39 ug/dL — AB (ref 41–142)
TIBC: 444 ug/dL (ref 236–444)
UIBC: 405 ug/dL — ABNORMAL HIGH (ref 120–384)

## 2016-09-08 LAB — PROTHROMBIN TIME (PT)
INR: 1 (ref 0.8–1.2)
PROTHROMBIN TIME: 10.7 s (ref 9.1–12.0)

## 2016-09-08 LAB — VON WILLEBRAND PANEL
Factor VIII Activity: 126 % (ref 57–163)
VON WILLEBRAND AG: 112 % (ref 50–200)
VON WILLEBRAND FACTOR: 75 % (ref 50–200)

## 2016-09-08 LAB — APTT: aPTT: 28 s (ref 24–33)

## 2016-09-08 LAB — FERRITIN: FERRITIN: 13 ng/mL (ref 9–269)

## 2016-09-11 ENCOUNTER — Telehealth: Payer: Self-pay | Admitting: *Deleted

## 2016-09-11 NOTE — Telephone Encounter (Signed)
-----   Message from [name], MD sent at 09/08/2016  5:23 PM EDT ----- Call - all of the blood clotting studies are normal so far!!!  Hope you are having a good time at the beach!!!  [place]

## 2016-10-04 ENCOUNTER — Ambulatory Visit: Payer: PPO | Admitting: Hematology & Oncology

## 2016-10-04 ENCOUNTER — Other Ambulatory Visit: Payer: PPO

## 2016-10-25 DIAGNOSIS — L821 Other seborrheic keratosis: Secondary | ICD-10-CM | POA: Diagnosis not present

## 2016-10-25 DIAGNOSIS — D225 Melanocytic nevi of trunk: Secondary | ICD-10-CM | POA: Diagnosis not present

## 2016-11-29 ENCOUNTER — Other Ambulatory Visit: Payer: Self-pay | Admitting: Interventional Cardiology

## 2016-12-04 DIAGNOSIS — Z23 Encounter for immunization: Secondary | ICD-10-CM | POA: Diagnosis not present

## 2017-01-03 ENCOUNTER — Ambulatory Visit (HOSPITAL_BASED_OUTPATIENT_CLINIC_OR_DEPARTMENT_OTHER): Payer: PPO | Admitting: Hematology & Oncology

## 2017-01-03 ENCOUNTER — Encounter: Payer: Self-pay | Admitting: Hematology & Oncology

## 2017-01-03 ENCOUNTER — Other Ambulatory Visit (HOSPITAL_BASED_OUTPATIENT_CLINIC_OR_DEPARTMENT_OTHER): Payer: PPO

## 2017-01-03 ENCOUNTER — Other Ambulatory Visit: Payer: Self-pay

## 2017-01-03 VITALS — BP 151/75 | HR 62 | Temp 97.9°F | Resp 16 | Wt 116.0 lb

## 2017-01-03 DIAGNOSIS — D751 Secondary polycythemia: Secondary | ICD-10-CM | POA: Diagnosis not present

## 2017-01-03 DIAGNOSIS — R634 Abnormal weight loss: Secondary | ICD-10-CM

## 2017-01-03 DIAGNOSIS — B1710 Acute hepatitis C without hepatic coma: Secondary | ICD-10-CM

## 2017-01-03 DIAGNOSIS — Z720 Tobacco use: Secondary | ICD-10-CM

## 2017-01-03 DIAGNOSIS — T148XXA Other injury of unspecified body region, initial encounter: Secondary | ICD-10-CM

## 2017-01-03 LAB — CBC WITH DIFFERENTIAL (CANCER CENTER ONLY)
BASO#: 0 10*3/uL (ref 0.0–0.2)
BASO%: 0.2 % (ref 0.0–2.0)
EOS ABS: 0.1 10*3/uL (ref 0.0–0.5)
EOS%: 0.9 % (ref 0.0–7.0)
HCT: 42.9 % (ref 34.8–46.6)
HGB: 14.1 g/dL (ref 11.6–15.9)
LYMPH#: 2.3 10*3/uL (ref 0.9–3.3)
LYMPH%: 28.3 % (ref 14.0–48.0)
MCH: 28.7 pg (ref 26.0–34.0)
MCHC: 32.9 g/dL (ref 32.0–36.0)
MCV: 87 fL (ref 81–101)
MONO#: 0.8 10*3/uL (ref 0.1–0.9)
MONO%: 9.3 % (ref 0.0–13.0)
NEUT#: 5 10*3/uL (ref 1.5–6.5)
NEUT%: 61.3 % (ref 39.6–80.0)
Platelets: 261 10*3/uL (ref 145–400)
RBC: 4.92 10*6/uL (ref 3.70–5.32)
RDW: 15.3 % (ref 11.1–15.7)
WBC: 8.1 10*3/uL (ref 3.9–10.0)

## 2017-01-03 LAB — CMP (CANCER CENTER ONLY)
ALBUMIN: 3.7 g/dL (ref 3.3–5.5)
ALK PHOS: 85 U/L — AB (ref 26–84)
ALT(SGPT): 19 U/L (ref 10–47)
AST: 25 U/L (ref 11–38)
BUN: 12 mg/dL (ref 7–22)
CALCIUM: 8.8 mg/dL (ref 8.0–10.3)
CHLORIDE: 94 meq/L — AB (ref 98–108)
CO2: 31 mEq/L (ref 18–33)
Creat: 1 mg/dl (ref 0.6–1.2)
Glucose, Bld: 124 mg/dL — ABNORMAL HIGH (ref 73–118)
POTASSIUM: 3.7 meq/L (ref 3.3–4.7)
SODIUM: 139 meq/L (ref 128–145)
Total Bilirubin: 0.6 mg/dl (ref 0.20–1.60)
Total Protein: 6.8 g/dL (ref 6.4–8.1)

## 2017-01-03 NOTE — Progress Notes (Signed)
Hematology and Oncology Follow Up Visit  [name] [id number] Feb 26, 1950 66 y.o. 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  Her main problem now is weight loss.  She has been continually losing weight.  I think this is been going on for a year.  She is still smoking.  I suspect she probably has underlying COPD.  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  I do think that a workup is necessary given her tobacco use.  She has had past x-rays and scans.  She has had "pneumonia" in the past.  I think this has to be looked at again.  As far as the polycythemia is concerned, this really has not been much of a problem.  We are very aggressive with her phlebotomies.  She is not working.  She is taking care of her 80 year old mother.  She has had no fever.  She has had no sweats.  She has had no rashes.  She has had a little bit of a cough but no hemoptysis.  There is been no change in bowel or bladder habits.  Overall, I say performance status is ECOG 1..     Medications:  Allergies as of 01/03/2017      Reactions   Diphenhydramine Hcl Hives   Bactrim [sulfamethoxazole-trimethoprim] Nausea And Vomiting      Medication List        Accurate as of 01/03/17  2:29 PM. Always use your most recent med list.          acetaminophen 500 MG tablet Commonly known as:  TYLENOL Take 500-1,000 mg by mouth every 6 (six) hours as needed for mild pain or headache (depends on pain if takes 1-2 tablets).   albuterol 108 (90 Base) MCG/ACT inhaler Commonly known as:  PROVENTIL HFA;VENTOLIN HFA Inhale 2 puffs into the lungs every 6 (six) hours as needed for wheezing or shortness of breath.   ALPRAZolam 0.5 MG  tablet Commonly known as:  XANAX Take 0.5 mg by mouth at bedtime.   aspirin EC 81 MG tablet Take 81 mg by mouth daily.   Biotin 1000 MCG tablet Take 1,000 mcg by mouth daily.   clopidogrel 75 MG tablet Commonly known as:  PLAVIX TAKE ONE TABLET BY MOUTH ONCE DAILY   clotrimazole-betamethasone cream Commonly known as:  LOTRISONE use 1 application to affected area as needed for sore areas on body   escitalopram 5 MG tablet Commonly known as:  LEXAPRO Take 5 mg by mouth.   hydrochlorothiazide 12.5 MG capsule Commonly known as:  MICROZIDE TAKE 1 CAPSULE BY MOUTH ONCE DAILY   lansoprazole 30 MG capsule Commonly known as:  PREVACID Take 30 mg by mouth daily as needed (for ulcer/acid reflex). Ulcer/ acid reflux   metoprolol succinate 100 MG 24 hr tablet Commonly known as:  TOPROL-XL TAKE 1 & 1/2 (ONE & ONE-HALF) TABLETS BY MOUTH ONCE DAILY   mupirocin ointment 2 % Commonly known as:  BACTROBAN Apply twice a day to right arm wound   nitroGLYCERIN 0.4 MG SL tablet Commonly known as:  NITROSTAT Place 1 tablet (0.4 mg total) under the tongue every 5 (five) minutes as needed for chest pain.  simvastatin 10 MG tablet Commonly known as:  ZOCOR Take 10 mg by mouth every other day.   VITAMIN C PO Take 2 tablets by mouth daily.   vitamin E 400 UNIT capsule Take 400 Units by mouth daily.       Allergies:  Allergies  Allergen Reactions  . Diphenhydramine Hcl Hives  . Bactrim [Sulfamethoxazole-Trimethoprim] Nausea And Vomiting    Past Medical History, Surgical history, Social history, and Family History were reviewed and updated.  Review of Systems: As stated in the interim history  Physical Exam:  weight is 116 lb (52.6 kg). Her oral temperature is 97.9 F (36.6 C). Her blood pressure is 151/75 (abnormal) and her pulse is 62. Her respiration is 16 and oxygen saturation is 95%.   Wt Readings from Last 3 Encounters:  01/03/17 116 lb (52.6 kg)  08/01/16 119 lb 14.4  oz (54.4 kg)  07/28/16 116 lb (52.6 kg)    I examined [name] today.  I my findings on examination are noted below with any appropriate changes:   Head and exam shows no ocular or oral lesions. She has no scleral icterus. There is no adenopathy in the neck. Lungs are with some crackles bilaterally. There might be some decreased breath sounds over on the right side. Cardiac exam regular rate and rhythm with no murmurs, rubs or bruits. Abdomen is soft. She has good bowel sounds. There is no fluid wave. There is no palpable liver or spleen tip. There is a palpable abdominal mass. She is slightly tender in the left lower quadrant. Back exam no tenderness over the spine, ribs or hips. Extremities shows no clubbing, cyanosis or edema. She has the wound on the extensor aspect of her right forearm. The sutures are in place. There is some erythema about the wound. Neurological exam shows no focal neurological deficits. Skin exam no rashes, ecchymosis or petechia.ed  Lab Results  Component Value Date   WBC 8.1 01/03/2017   HGB 14.1 01/03/2017   HCT 42.9 01/03/2017   MCV 87 01/03/2017   PLT 261 01/03/2017   Lab Results  Component Value Date   FERRITIN 13 09/07/2016   IRON 39 (L) 09/07/2016   TIBC 444 09/07/2016   UIBC 405 (H) 09/07/2016   IRONPCTSAT 9 (L) 09/07/2016   Lab Results  Component Value Date   RETICCTPCT 1.2 08/03/2010   RBC 4.92 01/03/2017   RETICCTABS 61.3 08/03/2010   No results found for: KPAFRELGTCHN, LAMBDASER, KAPLAMBRATIO No results found for: IGGSERUM, IGA, IGMSERUM No results found for: Odetta Pink, SPEI   Chemistry      Component Value Date/Time   NA 139 01/03/2017 1327   NA 136 12/28/2015 1311   K 3.7 01/03/2017 1327   K 4.9 12/28/2015 1311   CL 94 (L) 01/03/2017 1327   CO2 31 01/03/2017 1327   CO2 28 12/28/2015 1311   BUN 12 01/03/2017 1327   BUN 10.4 12/28/2015 1311   CREATININE 1.0 01/03/2017 1327    CREATININE 0.9 12/28/2015 1311      Component Value Date/Time   CALCIUM 8.8 01/03/2017 1327   CALCIUM 9.6 12/28/2015 1311   ALKPHOS 85 (H) 01/03/2017 1327   ALKPHOS 83 12/28/2015 1311   AST 25 01/03/2017 1327   AST 25 12/28/2015 1311   ALT 19 01/03/2017 1327   ALT 13 12/28/2015 1311   BILITOT 0.60 01/03/2017 1327   BILITOT 0.74 12/28/2015 1311     Impression and Plan: [name] is a 66 yo white female with polycythemia. She has responded nicely to phlebotomy and is doing well.   I am worried about this weight loss.  I am not sure why she has this.  However, given the fact that she smokes, we really need to make sure that there is no malignancy.  Her last chest x-ray back in October 2017 did not show anything obvious.  She does have a little bit of a cough.  I do think this is where a CT scan of the chest is necessary.  I am also going to test her thyroid.  Given that she has hepatitis C, I will check a CT of the abdomen and pelvis make sure that there is no hepatocellular carcinoma.  I spent about 35 minutes with her.  She feels most confident in [place] taking care of her.  I appreciate her confidence.  We will do our best to see what is going on with the weight loss.  It is possible that she just may be developing some cachexia from COPD.  I told her this and I told her that if this is what she has, the outcome is not going to be good for her.  Hopefully, this will get her to stop smoking. [name], MD 11/14/20182:29 PM

## 2017-01-04 ENCOUNTER — Telehealth: Payer: Self-pay

## 2017-01-04 LAB — IRON AND TIBC
%SAT: 14 % — AB (ref 21–57)
Iron: 63 ug/dL (ref 41–142)
TIBC: 436 ug/dL (ref 236–444)
UIBC: 373 ug/dL (ref 120–384)

## 2017-01-04 LAB — TSH: TSH: 3.731 m[IU]/L (ref 0.308–3.960)

## 2017-01-04 LAB — FERRITIN: FERRITIN: 14 ng/mL (ref 9–269)

## 2017-01-04 NOTE — Telephone Encounter (Signed)
Called and spoke with patient letting her know her thyroid is "ok" per Dr. Marin Olp.

## 2017-01-08 ENCOUNTER — Ambulatory Visit (HOSPITAL_BASED_OUTPATIENT_CLINIC_OR_DEPARTMENT_OTHER): Admission: RE | Admit: 2017-01-08 | Payer: PPO | Source: Ambulatory Visit

## 2017-01-08 ENCOUNTER — Ambulatory Visit (HOSPITAL_BASED_OUTPATIENT_CLINIC_OR_DEPARTMENT_OTHER): Payer: PPO

## 2017-01-09 ENCOUNTER — Encounter (HOSPITAL_BASED_OUTPATIENT_CLINIC_OR_DEPARTMENT_OTHER): Payer: Self-pay

## 2017-01-09 ENCOUNTER — Ambulatory Visit (HOSPITAL_BASED_OUTPATIENT_CLINIC_OR_DEPARTMENT_OTHER)
Admission: RE | Admit: 2017-01-09 | Discharge: 2017-01-09 | Disposition: A | Discharge status: Home / Self Care | Payer: PPO | Source: Ambulatory Visit | Attending: Hematology & Oncology | Admitting: Hematology & Oncology

## 2017-01-09 DIAGNOSIS — R634 Abnormal weight loss: Secondary | ICD-10-CM

## 2017-01-09 DIAGNOSIS — R918 Other nonspecific abnormal finding of lung field: Secondary | ICD-10-CM | POA: Diagnosis not present

## 2017-01-09 DIAGNOSIS — I700 Atherosclerosis of aorta: Secondary | ICD-10-CM | POA: Diagnosis not present

## 2017-01-09 DIAGNOSIS — B1920 Unspecified viral hepatitis C without hepatic coma: Secondary | ICD-10-CM | POA: Diagnosis not present

## 2017-01-09 DIAGNOSIS — I2510 Atherosclerotic heart disease of native coronary artery without angina pectoris: Secondary | ICD-10-CM | POA: Insufficient documentation

## 2017-01-09 DIAGNOSIS — K5730 Diverticulosis of large intestine without perforation or abscess without bleeding: Secondary | ICD-10-CM | POA: Insufficient documentation

## 2017-01-09 MED ORDER — IOPAMIDOL (ISOVUE-300) INJECTION 61%
100.0000 mL | Freq: Once | INTRAVENOUS | Status: AC | PRN
  Administered 2017-01-09: 100 mL via INTRAVENOUS

## 2017-01-10 ENCOUNTER — Telehealth: Payer: Self-pay | Admitting: *Deleted

## 2017-01-10 NOTE — Telephone Encounter (Addendum)
Patient is aware of scans  ----- Message from [name], MD sent at 01/10/2017  7:14 AM EST ----- Call - No sign of cancer on the CT scans!!!  pete

## 2017-01-21 ENCOUNTER — Other Ambulatory Visit: Payer: Self-pay | Admitting: Interventional Cardiology

## 2017-01-30 ENCOUNTER — Ambulatory Visit: Payer: PPO | Admitting: Vascular Surgery

## 2017-01-30 ENCOUNTER — Encounter (HOSPITAL_COMMUNITY): Payer: PPO

## 2017-02-07 ENCOUNTER — Telehealth: Payer: Self-pay

## 2017-02-07 NOTE — Telephone Encounter (Signed)
   [facility] Pre-operative Risk Assessment    Request for surgical clearance:  1. What type of surgery is being performed? Periodontal problems were present and may require surgical therapy   2. When is this surgery scheduled? Pending    3. Are there any medications that need to be held prior to surgery and how long? Plavix   4. Practice name and name of physician performing surgery? Dr. Lennie Muckle, Dr. Essie Hart, or Dr. Junita Push   5. What is your office phone and fax number? Phone [phone] Fax [phone]   6. Anesthesia type (None, local, MAC, general) ? Not Known   Michaelyn Barter 02/07/2017, 4:31 PM  _________________________________________________________________   (provider comments below)

## 2017-02-08 NOTE — Telephone Encounter (Signed)
Clearance note faxed to Dr.Mackler at fax # [phone].

## 2017-02-08 NOTE — Telephone Encounter (Signed)
   Primary Cardiologist: No primary care provider on file.  Chart reviewed as part of pre-operative protocol coverage. Given past medical history and time since last visit, based on ACC/AHA guidelines, [name] would be at acceptable risk for the planned procedure without further cardiovascular testing.   I will route this recommendation to the requesting party via Epic fax function and remove from pre-op pool.  Please call with questions.  Kerin Ransom, PA-C 02/08/2017, 4:40 PM

## 2017-03-19 DIAGNOSIS — F17210 Nicotine dependence, cigarettes, uncomplicated: Secondary | ICD-10-CM | POA: Diagnosis not present

## 2017-03-19 DIAGNOSIS — J449 Chronic obstructive pulmonary disease, unspecified: Secondary | ICD-10-CM | POA: Diagnosis not present

## 2017-03-19 DIAGNOSIS — E782 Mixed hyperlipidemia: Secondary | ICD-10-CM | POA: Diagnosis not present

## 2017-03-19 DIAGNOSIS — K219 Gastro-esophageal reflux disease without esophagitis: Secondary | ICD-10-CM | POA: Diagnosis not present

## 2017-03-19 DIAGNOSIS — I2510 Atherosclerotic heart disease of native coronary artery without angina pectoris: Secondary | ICD-10-CM | POA: Diagnosis not present

## 2017-03-19 DIAGNOSIS — B1920 Unspecified viral hepatitis C without hepatic coma: Secondary | ICD-10-CM | POA: Diagnosis not present

## 2017-03-19 DIAGNOSIS — G4700 Insomnia, unspecified: Secondary | ICD-10-CM | POA: Diagnosis not present

## 2017-03-19 DIAGNOSIS — Z23 Encounter for immunization: Secondary | ICD-10-CM | POA: Diagnosis not present

## 2017-03-19 DIAGNOSIS — Z1389 Encounter for screening for other disorder: Secondary | ICD-10-CM | POA: Diagnosis not present

## 2017-03-19 DIAGNOSIS — R634 Abnormal weight loss: Secondary | ICD-10-CM | POA: Diagnosis not present

## 2017-03-19 DIAGNOSIS — R739 Hyperglycemia, unspecified: Secondary | ICD-10-CM | POA: Diagnosis not present

## 2017-03-19 DIAGNOSIS — I10 Essential (primary) hypertension: Secondary | ICD-10-CM | POA: Diagnosis not present

## 2017-03-19 DIAGNOSIS — D45 Polycythemia vera: Secondary | ICD-10-CM | POA: Diagnosis not present

## 2017-03-19 DIAGNOSIS — Z0000 Encounter for general adult medical examination without abnormal findings: Secondary | ICD-10-CM | POA: Diagnosis not present

## 2017-03-19 DIAGNOSIS — F325 Major depressive disorder, single episode, in full remission: Secondary | ICD-10-CM | POA: Diagnosis not present

## 2017-03-20 ENCOUNTER — Inpatient Hospital Stay (HOSPITAL_BASED_OUTPATIENT_CLINIC_OR_DEPARTMENT_OTHER)
Admission: EM | Admit: 2017-03-20 | Discharge: 2017-03-24 | DRG: 394 | Disposition: A | Discharge status: Home / Self Care | Payer: PPO | Attending: Internal Medicine | Admitting: Internal Medicine

## 2017-03-20 ENCOUNTER — Emergency Department (HOSPITAL_BASED_OUTPATIENT_CLINIC_OR_DEPARTMENT_OTHER): Payer: PPO

## 2017-03-20 ENCOUNTER — Encounter (HOSPITAL_BASED_OUTPATIENT_CLINIC_OR_DEPARTMENT_OTHER): Payer: Self-pay | Admitting: *Deleted

## 2017-03-20 ENCOUNTER — Other Ambulatory Visit: Payer: Self-pay

## 2017-03-20 DIAGNOSIS — K5732 Diverticulitis of large intestine without perforation or abscess without bleeding: Secondary | ICD-10-CM | POA: Diagnosis present

## 2017-03-20 DIAGNOSIS — Z8249 Family history of ischemic heart disease and other diseases of the circulatory system: Secondary | ICD-10-CM

## 2017-03-20 DIAGNOSIS — K219 Gastro-esophageal reflux disease without esophagitis: Secondary | ICD-10-CM | POA: Diagnosis present

## 2017-03-20 DIAGNOSIS — R112 Nausea with vomiting, unspecified: Secondary | ICD-10-CM | POA: Diagnosis not present

## 2017-03-20 DIAGNOSIS — Z888 Allergy status to other drugs, medicaments and biological substances status: Secondary | ICD-10-CM

## 2017-03-20 DIAGNOSIS — Z79899 Other long term (current) drug therapy: Secondary | ICD-10-CM

## 2017-03-20 DIAGNOSIS — K625 Hemorrhage of anus and rectum: Secondary | ICD-10-CM | POA: Diagnosis not present

## 2017-03-20 DIAGNOSIS — K5730 Diverticulosis of large intestine without perforation or abscess without bleeding: Secondary | ICD-10-CM | POA: Diagnosis not present

## 2017-03-20 DIAGNOSIS — B029 Zoster without complications: Secondary | ICD-10-CM | POA: Diagnosis present

## 2017-03-20 DIAGNOSIS — I25119 Atherosclerotic heart disease of native coronary artery with unspecified angina pectoris: Secondary | ICD-10-CM | POA: Diagnosis present

## 2017-03-20 DIAGNOSIS — K529 Noninfective gastroenteritis and colitis, unspecified: Secondary | ICD-10-CM | POA: Diagnosis not present

## 2017-03-20 DIAGNOSIS — F17290 Nicotine dependence, other tobacco product, uncomplicated: Secondary | ICD-10-CM | POA: Diagnosis present

## 2017-03-20 DIAGNOSIS — I739 Peripheral vascular disease, unspecified: Secondary | ICD-10-CM | POA: Diagnosis present

## 2017-03-20 DIAGNOSIS — K922 Gastrointestinal hemorrhage, unspecified: Secondary | ICD-10-CM

## 2017-03-20 DIAGNOSIS — F418 Other specified anxiety disorders: Secondary | ICD-10-CM | POA: Diagnosis present

## 2017-03-20 DIAGNOSIS — F341 Dysthymic disorder: Secondary | ICD-10-CM | POA: Diagnosis present

## 2017-03-20 DIAGNOSIS — Z720 Tobacco use: Secondary | ICD-10-CM | POA: Diagnosis not present

## 2017-03-20 DIAGNOSIS — K55039 Acute (reversible) ischemia of large intestine, extent unspecified: Principal | ICD-10-CM | POA: Diagnosis present

## 2017-03-20 DIAGNOSIS — R197 Diarrhea, unspecified: Secondary | ICD-10-CM | POA: Diagnosis not present

## 2017-03-20 DIAGNOSIS — Z883 Allergy status to other anti-infective agents status: Secondary | ICD-10-CM | POA: Diagnosis not present

## 2017-03-20 DIAGNOSIS — I2510 Atherosclerotic heart disease of native coronary artery without angina pectoris: Secondary | ICD-10-CM | POA: Diagnosis present

## 2017-03-20 DIAGNOSIS — B1920 Unspecified viral hepatitis C without hepatic coma: Secondary | ICD-10-CM | POA: Diagnosis present

## 2017-03-20 DIAGNOSIS — R1032 Left lower quadrant pain: Secondary | ICD-10-CM | POA: Diagnosis not present

## 2017-03-20 DIAGNOSIS — R1110 Vomiting, unspecified: Secondary | ICD-10-CM | POA: Diagnosis not present

## 2017-03-20 DIAGNOSIS — K559 Vascular disorder of intestine, unspecified: Secondary | ICD-10-CM | POA: Diagnosis not present

## 2017-03-20 DIAGNOSIS — Z955 Presence of coronary angioplasty implant and graft: Secondary | ICD-10-CM | POA: Diagnosis not present

## 2017-03-20 DIAGNOSIS — Z7902 Long term (current) use of antithrombotics/antiplatelets: Secondary | ICD-10-CM | POA: Diagnosis not present

## 2017-03-20 DIAGNOSIS — Z7982 Long term (current) use of aspirin: Secondary | ICD-10-CM

## 2017-03-20 DIAGNOSIS — D45 Polycythemia vera: Secondary | ICD-10-CM | POA: Diagnosis present

## 2017-03-20 DIAGNOSIS — J449 Chronic obstructive pulmonary disease, unspecified: Secondary | ICD-10-CM | POA: Diagnosis present

## 2017-03-20 DIAGNOSIS — Z716 Tobacco abuse counseling: Secondary | ICD-10-CM | POA: Diagnosis not present

## 2017-03-20 DIAGNOSIS — I10 Essential (primary) hypertension: Secondary | ICD-10-CM | POA: Diagnosis present

## 2017-03-20 DIAGNOSIS — R109 Unspecified abdominal pain: Secondary | ICD-10-CM | POA: Diagnosis not present

## 2017-03-20 LAB — BASIC METABOLIC PANEL
Anion gap: 11 (ref 5–15)
BUN: 11 mg/dL (ref 6–20)
CO2: 29 mmol/L (ref 22–32)
CREATININE: 0.77 mg/dL (ref 0.44–1.00)
Calcium: 9.3 mg/dL (ref 8.9–10.3)
Chloride: 92 mmol/L — ABNORMAL LOW (ref 101–111)
GFR calc Af Amer: >60 mL/min (ref >60)
GFR calc non Af Amer: >60 mL/min (ref >60)
GLUCOSE: 124 mg/dL — AB (ref 65–99)
Potassium: 4.1 mmol/L (ref 3.5–5.1)
Sodium: 132 mmol/L — ABNORMAL LOW (ref 135–145)

## 2017-03-20 LAB — HEPATIC FUNCTION PANEL
ALK PHOS: 71 U/L (ref 38–126)
ALT: 14 U/L (ref 14–54)
AST: 33 U/L (ref 15–41)
Albumin: 4.5 g/dL (ref 3.5–5.0)
BILIRUBIN TOTAL: 1 mg/dL (ref 0.3–1.2)
Total Protein: 7.9 g/dL (ref 6.5–8.1)

## 2017-03-20 LAB — CBC WITH DIFFERENTIAL/PLATELET
Basophils Absolute: 0 10*3/uL (ref 0.0–0.1)
Basophils Relative: 0 %
Eosinophils Absolute: 0 10*3/uL (ref 0.0–0.7)
Eosinophils Relative: 0 %
HEMATOCRIT: 46.7 % — AB (ref 36.0–46.0)
Hemoglobin: 15.9 g/dL — ABNORMAL HIGH (ref 12.0–15.0)
LYMPHS PCT: 14 %
Lymphs Abs: 2.1 10*3/uL (ref 0.7–4.0)
MCH: 29.8 pg (ref 26.0–34.0)
MCHC: 34 g/dL (ref 30.0–36.0)
MCV: 87.6 fL (ref 78.0–100.0)
MONO ABS: 1 10*3/uL (ref 0.1–1.0)
MONOS PCT: 7 %
Neutro Abs: 11.8 10*3/uL — ABNORMAL HIGH (ref 1.7–7.7)
Neutrophils Relative %: 79 %
Platelets: 246 10*3/uL (ref 150–400)
RBC: 5.33 MIL/uL — ABNORMAL HIGH (ref 3.87–5.11)
RDW: 13.7 % (ref 11.5–15.5)
WBC: 14.8 10*3/uL — ABNORMAL HIGH (ref 4.0–10.5)

## 2017-03-20 LAB — LIPASE, BLOOD: Lipase: 29 U/L (ref 11–51)

## 2017-03-20 LAB — URINALYSIS, ROUTINE W REFLEX MICROSCOPIC
Bilirubin Urine: NEGATIVE
GLUCOSE, UA: NEGATIVE mg/dL
HGB URINE DIPSTICK: NEGATIVE
Ketones, ur: NEGATIVE mg/dL
Leukocytes, UA: NEGATIVE
Nitrite: NEGATIVE
PH: 5.5 (ref 5.0–8.0)
Protein, ur: NEGATIVE mg/dL

## 2017-03-20 LAB — PROTIME-INR
INR: 0.97
Prothrombin Time: 12.8 seconds (ref 11.4–15.2)

## 2017-03-20 MED ORDER — ONDANSETRON HCL 4 MG/2ML IJ SOLN
4.0000 mg | Freq: Once | INTRAMUSCULAR | Status: AC
Start: 2017-03-20 — End: 2017-03-20
  Administered 2017-03-20: 4 mg via INTRAVENOUS
  Filled 2017-03-20: qty 2

## 2017-03-20 MED ORDER — MORPHINE SULFATE (PF) 4 MG/ML IV SOLN
4.0000 mg | Freq: Once | INTRAVENOUS | Status: AC
  Administered 2017-03-20: 4 mg via INTRAVENOUS
  Filled 2017-03-20: qty 1

## 2017-03-20 MED ORDER — IOPAMIDOL (ISOVUE-300) INJECTION 61%
100.0000 mL | Freq: Once | INTRAVENOUS | Status: AC | PRN
  Administered 2017-03-20: 100 mL via INTRAVENOUS

## 2017-03-20 MED ORDER — SODIUM CHLORIDE 0.9 % IV BOLUS (SEPSIS)
1000.0000 mL | Freq: Once | INTRAVENOUS | Status: AC
  Administered 2017-03-20: 1000 mL via INTRAVENOUS

## 2017-03-20 NOTE — ED Provider Notes (Signed)
[name] Provider Note   CSN: [id number] Arrival date & time: 03/20/17  1824     History   Chief Complaint Chief Complaint  Patient presents with  . Rectal Bleeding    HPI [name] is a 67 y.o. female.  The history is provided by the patient, medical records and a relative. No language interpreter was used.  Rectal Bleeding  Quality:  Bright red Amount:  Moderate Duration:  1 day Timing:  Constant Chronicity:  Recurrent Context: not constipation, not hemorrhoids, not rectal injury and not rectal pain   Relieved by:  Nothing Worsened by:  Nothing Ineffective treatments:  None tried Associated symptoms: abdominal pain and vomiting   Associated symptoms: no fever, no hematemesis, no light-headedness and no loss of consciousness   Risk factors comment:  Plavix use   Past Medical History:  Diagnosis Date  . Anxiety   . Arthritis   . CAD (coronary artery disease)   . COPD (chronic obstructive pulmonary disease) ([place])   . Depression   . Dyspnea   . GERD (gastroesophageal reflux disease)   . Hepatitis C   . Hypertension   . Peripheral vascular disease ([place])   . Polycythemia 2009  . Seizures ([place]) 1970   x1- pt. reports that there was never any causative agent      Patient Active Problem List   Diagnosis Date Noted  . Asymptomatic stenosis of left carotid artery 07/24/2016  . Tobacco abuse 03/09/2015  . Influenza 03/02/2012  . COPD exacerbation ([place]) 03/02/2012  . Hypoxia 03/02/2012  . Hematochezia 10/30/2011  . Coronary artery disease involving native coronary artery of native heart with angina pectoris ([place])   . GERD (gastroesophageal reflux disease)   . Polycythemia   . Irritable bowel syndrome 08/15/2010  . HERPES ZOSTER 10/07/2009  . LIPOMA 10/07/2009  . 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 UNSPECIFIED VITAMIN D DEFICIENCY 01/06/2009  . ESOPHAGEAL REFLUX 06/04/2008  . ANXIETY DEPRESSION 01/24/2007  .  OSTEOARTHRITIS, GENERALIZED, MULTIPLE JOINTS 11/21/2006  . Acute hepatitis C virus infection 10/23/2006  . Essential hypertension 10/23/2006    Past Surgical History:  Procedure Laterality Date  . 2 stints  Aug 26,2010   Dr. Tawnya Crook  . ABDOMINAL SURGERY  1970's   for excessive bleeding from loss of pregnancy, required blood transfusion post procedure   . cartoid endartherectomy  2007  . CORONARY ANGIOPLASTY WITH STENT PLACEMENT  10/09/2008  . ENDARTERECTOMY Left 07/24/2016   Procedure: ENDARTERECTOMY CAROTID;  Surgeon: [name], MD;  Location: [facility];  Service: Vascular;  Laterality: Left;  . LYMPH NODE DISSECTION Left 1990's   benign   . TUBAL LIGATION      OB History    No data available       Home Medications    Prior to Admission medications   Medication Sig Start Date End Date Taking? Authorizing Provider  acetaminophen (TYLENOL) 500 MG tablet Take 500-1,000 mg by mouth every 6 (six) hours as needed for mild pain or headache (depends on pain if takes 1-2 tablets).     [provider]  albuterol (PROVENTIL HFA;VENTOLIN HFA) 108 (90 BASE) MCG/ACT inhaler Inhale 2 puffs into the lungs every 6 (six) hours as needed for wheezing or shortness of breath. 02/21/14   Gregor Hams, MD  ALPRAZolam Duanne Moron) 0.5 MG tablet Take 0.5 mg by mouth at bedtime.  11/27/12   [provider]  Ascorbic Acid (VITAMIN C PO) Take 2 tablets by mouth daily.  [provider]  aspirin EC 81 MG tablet Take 81 mg by mouth daily.    [provider]  Biotin 1000 MCG tablet Take 1,000 mcg by mouth daily.    [provider]  clopidogrel (PLAVIX) 75 MG tablet TAKE 1 TABLET BY MOUTH ONCE DAILY 01/22/17   Belva Crome, MD  clotrimazole-betamethasone (LOTRISONE) cream use 1 application to affected area as needed for sore areas on body 12/12/12   Marletta Lor, MD  escitalopram (LEXAPRO) 5 MG tablet Take 5 mg by mouth.    [provider]    hydrochlorothiazide (MICROZIDE) 12.5 MG capsule TAKE 1 CAPSULE BY MOUTH ONCE DAILY 06/12/16   Belva Crome, MD  lansoprazole (PREVACID) 30 MG capsule Take 30 mg by mouth daily as needed (for ulcer/acid reflex). Ulcer/ acid reflux    [provider]  metoprolol succinate (TOPROL-XL) 100 MG 24 hr tablet TAKE 1 & 1/2 (ONE & ONE-HALF) TABLETS BY MOUTH ONCE DAILY 11/30/16   Belva Crome, MD  mupirocin ointment (BACTROBAN) 2 % Apply twice a day to right arm wound Patient taking differently: Apply 1 application topically as needed.  06/28/16   [name], MD  nitroGLYCERIN (NITROSTAT) 0.4 MG SL tablet Place 1 tablet (0.4 mg total) under the tongue every 5 (five) minutes as needed for chest pain. 03/09/15   Brett Canales, PA-C  simvastatin (ZOCOR) 10 MG tablet Take 10 mg by mouth every other day.  06/12/14   [provider]  vitamin E 400 UNIT capsule Take 400 Units by mouth daily.    [provider]    Family History Family History  Problem Relation Age of Onset  . Heart disease Mother   . Heart disease Father     Social History Social History   Tobacco Use  . Smoking status: Former Smoker    Packs/day: 0.25    Years: 48.00    Pack years: 12.00    Types: Cigarettes    Start date: 03/20/1968  . Smokeless tobacco: Never Used  . Tobacco comment: "1 pk lasts about 3 days", uses VAPE, uses nicorette patch   Substance Use Topics  . Alcohol use: Yes    Alcohol/week: 0.0 oz    Comment: social- 2-3 x's per month   . Drug use: No    Comment: has smoked cigarettes off and on     Allergies   Diphenhydramine hcl and Bactrim [sulfamethoxazole-trimethoprim]   Review of Systems Review of Systems  Constitutional: Negative for chills, diaphoresis, fatigue and fever.  HENT: Negative for congestion.   Respiratory: Negative for cough, chest tightness, shortness of breath and stridor.   Cardiovascular: Negative for chest pain, palpitations and leg swelling.   Gastrointestinal: Positive for abdominal pain, anal bleeding, hematochezia, nausea and vomiting. Negative for abdominal distention, constipation, diarrhea and hematemesis.  Genitourinary: Negative for dysuria and flank pain.  Musculoskeletal: Negative for back pain, neck pain and neck stiffness.  Neurological: Negative for loss of consciousness, light-headedness, numbness and headaches.  Psychiatric/Behavioral: Negative for agitation and confusion.  All other systems reviewed and are negative.    Physical Exam Updated Vital Signs BP (!) 202/86 (BP Location: Left Arm)   Pulse 63   Temp 98.9 F (37.2 C) (Oral)   Resp 20   Ht 5' (1.524 m)   Wt 51.3 kg (113 lb)   SpO2 96%   BMI 22.07 kg/m   Physical Exam  Constitutional: She is oriented to person, place, and time. She  appears well-developed and well-nourished. No distress.  HENT:  Head: Normocephalic.  Mouth/Throat: Oropharynx is clear and moist.  Eyes: Conjunctivae and EOM are normal. Pupils are equal, round, and reactive to light.  Cardiovascular: Normal rate and intact distal pulses.  No murmur heard. Pulmonary/Chest: No stridor. No respiratory distress. She has no wheezes. She has no rales. She exhibits no tenderness.  Abdominal: Soft. Normal appearance. There is tenderness in the suprapubic area and left lower quadrant. There is no rigidity, no rebound, no guarding and no CVA tenderness.    Musculoskeletal: She exhibits no tenderness.  Neurological: She is alert and oriented to person, place, and time. No sensory deficit. She exhibits normal muscle tone.  Skin: Skin is warm. Capillary refill takes less than 2 seconds. She is not diaphoretic. No erythema.  Nursing note and vitals reviewed.    ED Treatments / Results  Labs (all labs ordered are listed, but only abnormal results are displayed) Labs Reviewed  CBC WITH DIFFERENTIAL/PLATELET - Abnormal; Notable for the following components:      Result Value   WBC 14.8 (*)     RBC 5.33 (*)    Hemoglobin 15.9 (*)    HCT 46.7 (*)    Neutro Abs 11.8 (*)    All other components within normal limits  BASIC METABOLIC PANEL - Abnormal; Notable for the following components:   Sodium 132 (*)    Chloride 92 (*)    Glucose, Bld 124 (*)    All other components within normal limits  HEPATIC FUNCTION PANEL - Abnormal; Notable for the following components:   Bilirubin, Direct <0.1 (*)    All other components within normal limits  URINALYSIS, ROUTINE W REFLEX MICROSCOPIC - Abnormal; Notable for the following components:   Color, Urine COLORLESS (*)    Specific Gravity, Urine <1.005 (*)    All other components within normal limits  CBC - Abnormal; Notable for the following components:   WBC 11.9 (*)    All other components within normal limits  URINE CULTURE  CULTURE, BLOOD (ROUTINE X 2)  CULTURE, BLOOD (ROUTINE X 2)  LIPASE, BLOOD  PROTIME-INR    EKG  EKG Interpretation None       Radiology Ct Abdomen Pelvis W Contrast  Result Date: 03/21/2017 CLINICAL DATA:  Nausea and vomiting, bright red diarrhea for 21 hours. History of hepatitis C and rectus muscle hematoma. Diverticulitis suspected. EXAM: CT ABDOMEN AND PELVIS WITH CONTRAST TECHNIQUE: Multidetector CT imaging of the abdomen and pelvis was performed using the standard protocol following bolus administration of intravenous contrast. CONTRAST:  100 cc Isovue 300 COMPARISON:  CT dated 01/09/2017. FINDINGS: Lower chest: No acute abnormality. Hepatobiliary: No focal liver abnormality is seen. No gallstones, gallbladder wall thickening, or biliary dilatation. Pancreas: Unremarkable. No pancreatic ductal dilatation or surrounding inflammatory changes. Spleen: Normal in size without focal abnormality. Adrenals/Urinary Tract: Adrenal glands appear normal. Kidneys appear normal without mass, stone or hydronephrosis. No obstructing ureteral calculi. Bladder appears normal. Stomach/Bowel: Thickening of the walls of the  upper sigmoid colon and lower descending colon. The extent of the involvement suggests colitis of infectious or inflammatory nature. There is scattered diverticulosis within the sigmoid colon but no focal inflammatory changes to suggest acute diverticulitis. No dilated large or small bowel loops.  Stomach is unremarkable. Vascular/Lymphatic: Aortic atherosclerosis. No enlarged abdominal or pelvic lymph nodes. Reproductive: Uterus and bilateral adnexa are unremarkable. Other: Pericolonic fluid stranding at the levels of the colonic wall thickening. No abscess collection identified. No free  intraperitoneal air. Musculoskeletal: Mild degenerative change within the lower lumbar spine. No acute or suspicious osseous finding. IMPRESSION: 1. Pronounced thickening of the walls of the upper sigmoid colon and lower descending colon. The fairly long extent of involvement suggests colitis of infectious or inflammatory nature. 2. Associated pericolonic inflammation/fluid stranding. No abscess collection seen. No free intraperitoneal air. 3. Colonic diverticulosis without evidence of acute diverticulitis. 4. Aortic atherosclerosis. Electronically Signed   By: Franki Cabot M.D.   On: 03/21/2017 00:16    Procedures Procedures (including critical care time)  Medications Ordered in ED Medications  ciprofloxacin (CIPRO) IVPB 400 mg (not administered)    And  metroNIDAZOLE (FLAGYL) IVPB 500 mg (not administered)  sodium chloride 0.9 % bolus 1,000 mL (0 mLs Intravenous Stopped 03/20/17 2300)  ondansetron (ZOFRAN) injection 4 mg (4 mg Intravenous Given 03/20/17 2209)  morphine 4 MG/ML injection 4 mg (4 mg Intravenous Given 03/20/17 2209)  iopamidol (ISOVUE-300) 61 % injection 100 mL (100 mLs Intravenous Contrast Given 03/20/17 2327)  morphine 4 MG/ML injection 4 mg (4 mg Intravenous Given 03/21/17 0039)     Initial Impression / Assessment and Plan / ED Course  I have reviewed the triage vital signs and the nursing  notes.  Pertinent labs & imaging results that were available during my care of the patient were reviewed by me and considered in my medical decision making (see chart for details).     KARIANNA GUSMAN is a 67 y.o. female with a past medical history significant for hepatitis C, CAD, COPD, polycythemia vera, GERD, hypertension, and prior diverticulosis who presents with abdominal pain and rectal bleeding.  Patient reports that at 3 AM, she woke up with severe abdominal pain.  She reports that it was a sharp pain in her left lower quadrant and supra V area of her abdomen.  She reports it as a 9 out of 10 in severity at its worst.  It comes and goes.  She has felt it all day and began having episodes of severe rectal bleeding.  She reports large amounts of bright red blood that follow her abdominal pain episodes.  She denies any urinary symptoms.  She does report nausea and vomiting that did not appear bloody or dark.  She does report that she has lost approximately 10 pounds in the last month.  She denies  taking any pain medicine to help her symptoms.  She reports that she had a physical exam with her PCP yesterday which was unremarkable.  She denies other complaints on arrival.  On exam, patient is tenderness in the left lower quadrant and supra pubic areas of her abdomen.  No CVA tenderness.  Lungs clear.  No bruising or evidence of trauma seen.  Patient has symmetric pulses in all extremities.  Based on patient's history of diverticulosis with the rectal bleeding and abdominal pain I am concerned about diverticulitis versus other forms of colitis.  Laboratory testing was performed, fluids were given, pain and nausea medicine provided, and CT scan will be present.  Next  CT scan showed no evidence of perforation or abscess but did show a long segment of market thickening concerning for infectious versus inflammatory colitis.  Suspect this is the etiology of her bleeding.  Although patient has  polycythemia vera, her hemoglobin was elevated at 15.9 on arrival.  Patient will have repeat hemoglobin checked after she has had approximately 5 episodes of rectal bleeding here in the emergency department.    Patient's other laboratory testing showed  reassuring hepatic function panel urinalysis showed no infection, INR was normal, lipase not elevated.  Metabolic panel overall was reassuring and she had a mild leukocytosis.  Patient much better after pain medicine nausea medicine and fluids.  Patient will have hemoglobin rechecked.  If it has dropped a significant, patient may need admission for hemoglobin monitoring in the setting of Plavix use and rectal hemorrhage.  If it remains stable and patient's symptoms are better controlled, patient would likely be stable for discharge with pain medicine, nausea medicine, and antibiotics for colitis.  1:02 AM Repeat hemoglobin had dropped over 2 points.  It is now 13.7 down from 15.9.  In the setting of her Plavix use, and continued rectal bleeding, I feel patient needs to be admitted for IV antibiotics to treat the colitis as well as trending of her hemoglobin and treating her symptoms of pain.  Hospitalist team will be called and antibiotics were ordered.  Patient will be admitted to [facility] for further management.  Final Clinical Impressions(s) / ED Diagnoses   Final diagnoses:  Rectal bleeding  Lower GI bleed  Colitis    Clinical Impression: 1. Rectal bleeding   2. Lower GI bleed   3. Colitis     Disposition: Admit  This note was prepared with assistance of Dragon voice recognition software. Occasional wrong-word or sound-a-like substitutions may have occurred due to the inherent limitations of voice recognition software.        Tegeler, Gwenyth Allegra, MD 03/21/17 [phone]

## 2017-03-20 NOTE — ED Triage Notes (Signed)
Woke at 3am with pain in her lower abdomen. She was having vomiting x1 and diarrhea after having a hard stool. Bright red rectal bleeding all day with stools to numerous to count. Gas and belching.

## 2017-03-21 DIAGNOSIS — K625 Hemorrhage of anus and rectum: Secondary | ICD-10-CM

## 2017-03-21 DIAGNOSIS — Z8249 Family history of ischemic heart disease and other diseases of the circulatory system: Secondary | ICD-10-CM | POA: Diagnosis not present

## 2017-03-21 DIAGNOSIS — K219 Gastro-esophageal reflux disease without esophagitis: Secondary | ICD-10-CM | POA: Diagnosis present

## 2017-03-21 DIAGNOSIS — J449 Chronic obstructive pulmonary disease, unspecified: Secondary | ICD-10-CM | POA: Diagnosis present

## 2017-03-21 DIAGNOSIS — Z7982 Long term (current) use of aspirin: Secondary | ICD-10-CM | POA: Diagnosis not present

## 2017-03-21 DIAGNOSIS — Z720 Tobacco use: Secondary | ICD-10-CM | POA: Diagnosis not present

## 2017-03-21 DIAGNOSIS — F17290 Nicotine dependence, other tobacco product, uncomplicated: Secondary | ICD-10-CM | POA: Diagnosis present

## 2017-03-21 DIAGNOSIS — I25119 Atherosclerotic heart disease of native coronary artery with unspecified angina pectoris: Secondary | ICD-10-CM

## 2017-03-21 DIAGNOSIS — Z79899 Other long term (current) drug therapy: Secondary | ICD-10-CM | POA: Diagnosis not present

## 2017-03-21 DIAGNOSIS — I739 Peripheral vascular disease, unspecified: Secondary | ICD-10-CM | POA: Diagnosis present

## 2017-03-21 DIAGNOSIS — Z955 Presence of coronary angioplasty implant and graft: Secondary | ICD-10-CM | POA: Diagnosis not present

## 2017-03-21 DIAGNOSIS — I10 Essential (primary) hypertension: Secondary | ICD-10-CM

## 2017-03-21 DIAGNOSIS — B029 Zoster without complications: Secondary | ICD-10-CM | POA: Diagnosis present

## 2017-03-21 DIAGNOSIS — D45 Polycythemia vera: Secondary | ICD-10-CM

## 2017-03-21 DIAGNOSIS — K922 Gastrointestinal hemorrhage, unspecified: Secondary | ICD-10-CM | POA: Diagnosis not present

## 2017-03-21 DIAGNOSIS — Z888 Allergy status to other drugs, medicaments and biological substances status: Secondary | ICD-10-CM | POA: Diagnosis not present

## 2017-03-21 DIAGNOSIS — F418 Other specified anxiety disorders: Secondary | ICD-10-CM | POA: Diagnosis present

## 2017-03-21 DIAGNOSIS — Z7902 Long term (current) use of antithrombotics/antiplatelets: Secondary | ICD-10-CM | POA: Diagnosis not present

## 2017-03-21 DIAGNOSIS — K55039 Acute (reversible) ischemia of large intestine, extent unspecified: Secondary | ICD-10-CM | POA: Diagnosis present

## 2017-03-21 DIAGNOSIS — K559 Vascular disorder of intestine, unspecified: Secondary | ICD-10-CM | POA: Diagnosis not present

## 2017-03-21 DIAGNOSIS — Z716 Tobacco abuse counseling: Secondary | ICD-10-CM | POA: Diagnosis not present

## 2017-03-21 DIAGNOSIS — K529 Noninfective gastroenteritis and colitis, unspecified: Secondary | ICD-10-CM | POA: Diagnosis not present

## 2017-03-21 DIAGNOSIS — I2510 Atherosclerotic heart disease of native coronary artery without angina pectoris: Secondary | ICD-10-CM | POA: Diagnosis present

## 2017-03-21 DIAGNOSIS — Z883 Allergy status to other anti-infective agents status: Secondary | ICD-10-CM | POA: Diagnosis not present

## 2017-03-21 DIAGNOSIS — B1920 Unspecified viral hepatitis C without hepatic coma: Secondary | ICD-10-CM | POA: Diagnosis present

## 2017-03-21 DIAGNOSIS — K5732 Diverticulitis of large intestine without perforation or abscess without bleeding: Secondary | ICD-10-CM | POA: Diagnosis present

## 2017-03-21 LAB — CBC
HCT: 40.2 % (ref 36.0–46.0)
HEMATOCRIT: 40.1 % (ref 36.0–46.0)
HEMOGLOBIN: 13.3 g/dL (ref 12.0–15.0)
Hemoglobin: 13.7 g/dL (ref 12.0–15.0)
MCH: 29.7 pg (ref 26.0–34.0)
MCH: 30 pg (ref 26.0–34.0)
MCHC: 33.2 g/dL (ref 30.0–36.0)
MCHC: 34.1 g/dL (ref 30.0–36.0)
MCV: 88 fL (ref 78.0–100.0)
MCV: 89.5 fL (ref 78.0–100.0)
PLATELETS: 192 10*3/uL (ref 150–400)
Platelets: 191 10*3/uL (ref 150–400)
RBC: 4.48 MIL/uL (ref 3.87–5.11)
RBC: 4.57 MIL/uL (ref 3.87–5.11)
RDW: 13.4 % (ref 11.5–15.5)
RDW: 13.8 % (ref 11.5–15.5)
WBC: 11.3 10*3/uL — ABNORMAL HIGH (ref 4.0–10.5)
WBC: 11.9 10*3/uL — AB (ref 4.0–10.5)

## 2017-03-21 LAB — BASIC METABOLIC PANEL
Anion gap: 9 (ref 5–15)
BUN: 5 mg/dL — ABNORMAL LOW (ref 6–20)
CHLORIDE: 102 mmol/L (ref 101–111)
CO2: 24 mmol/L (ref 22–32)
CREATININE: 0.63 mg/dL (ref 0.44–1.00)
Calcium: 8.3 mg/dL — ABNORMAL LOW (ref 8.9–10.3)
GFR calc non Af Amer: >60 mL/min (ref >60)
Glucose, Bld: 118 mg/dL — ABNORMAL HIGH (ref 65–99)
Potassium: 3.1 mmol/L — ABNORMAL LOW (ref 3.5–5.1)
Sodium: 135 mmol/L (ref 135–145)

## 2017-03-21 LAB — TYPE AND SCREEN
ABO/RH(D): O POS
ANTIBODY SCREEN: NEGATIVE

## 2017-03-21 MED ORDER — CIPROFLOXACIN IN D5W 400 MG/200ML IV SOLN
400.0000 mg | Freq: Two times a day (BID) | INTRAVENOUS | Status: DC
Start: 2017-03-21 — End: 2017-03-24
  Administered 2017-03-21 – 2017-03-24 (×6): 400 mg via INTRAVENOUS
  Filled 2017-03-21 (×6): qty 200

## 2017-03-21 MED ORDER — ACETAMINOPHEN 325 MG PO TABS: 650.0000 mg | ORAL_TABLET | Freq: Four times a day (QID) | ORAL | Status: DC | PRN

## 2017-03-21 MED ORDER — ALPRAZOLAM 0.5 MG PO TABS: 0.5000 mg | ORAL_TABLET | Freq: Every day | ORAL | Status: DC

## 2017-03-21 MED ORDER — METOPROLOL SUCCINATE ER 100 MG PO TB24
100.0000 mg | ORAL_TABLET | Freq: Every day | ORAL | Status: DC
  Administered 2017-03-21 – 2017-03-24 (×3): 100 mg via ORAL
  Filled 2017-03-21 (×4): qty 1

## 2017-03-21 MED ORDER — CIPROFLOXACIN IN D5W 400 MG/200ML IV SOLN
400.0000 mg | Freq: Once | INTRAVENOUS | Status: AC
  Filled 2017-03-21: qty 200

## 2017-03-21 MED ORDER — HYDROCHLOROTHIAZIDE 12.5 MG PO CAPS
12.5000 mg | ORAL_CAPSULE | Freq: Every day | ORAL | Status: DC
  Administered 2017-03-21 – 2017-03-24 (×4): 12.5 mg via ORAL
  Filled 2017-03-21 (×4): qty 1

## 2017-03-21 MED ORDER — ALPRAZOLAM 0.5 MG PO TABS
0.5000 mg | ORAL_TABLET | Freq: Every evening | ORAL | Status: DC | PRN
  Administered 2017-03-21 – 2017-03-23 (×3): 0.5 mg via ORAL
  Filled 2017-03-21 (×3): qty 1

## 2017-03-21 MED ORDER — MORPHINE SULFATE (PF) 2 MG/ML IV SOLN
2.0000 mg | INTRAVENOUS | Status: DC | PRN
  Administered 2017-03-21 – 2017-03-24 (×5): 2 mg via INTRAVENOUS
  Filled 2017-03-21 (×5): qty 1

## 2017-03-21 MED ORDER — ONDANSETRON HCL 4 MG/2ML IJ SOLN: 4.0000 mg | Freq: Four times a day (QID) | INTRAMUSCULAR | Status: DC | PRN

## 2017-03-21 MED ORDER — BACID PO TABS
2.0000 | ORAL_TABLET | Freq: Three times a day (TID) | ORAL | Status: DC
  Administered 2017-03-21 – 2017-03-24 (×8): 2 via ORAL
  Filled 2017-03-21 (×9): qty 2

## 2017-03-21 MED ORDER — ONDANSETRON HCL 4 MG PO TABS: 4.0000 mg | ORAL_TABLET | Freq: Four times a day (QID) | ORAL | Status: DC | PRN

## 2017-03-21 MED ORDER — NICOTINE 21 MG/24HR TD PT24
21.0000 mg | MEDICATED_PATCH | Freq: Every day | TRANSDERMAL | Status: DC
  Administered 2017-03-21 – 2017-03-24 (×4): 21 mg via TRANSDERMAL
  Filled 2017-03-21 (×4): qty 1

## 2017-03-21 MED ORDER — SODIUM CHLORIDE 0.9 % IV SOLN
INTRAVENOUS | Status: DC
  Administered 2017-03-21 (×2): via INTRAVENOUS

## 2017-03-21 MED ORDER — POTASSIUM CHLORIDE 20 MEQ PO PACK
40.0000 meq | PACK | ORAL | Status: AC
  Administered 2017-03-21 (×2): 40 meq via ORAL
  Filled 2017-03-21 (×2): qty 2

## 2017-03-21 MED ORDER — PANTOPRAZOLE SODIUM 40 MG PO TBEC
40.0000 mg | DELAYED_RELEASE_TABLET | Freq: Every day | ORAL | Status: DC
  Administered 2017-03-21 – 2017-03-24 (×4): 40 mg via ORAL
  Filled 2017-03-21 (×4): qty 1

## 2017-03-21 MED ORDER — METRONIDAZOLE IN NACL 5-0.79 MG/ML-% IV SOLN
500.0000 mg | Freq: Once | INTRAVENOUS | Status: AC
  Administered 2017-03-21: 500 mg via INTRAVENOUS
  Filled 2017-03-21: qty 100

## 2017-03-21 MED ORDER — ESCITALOPRAM OXALATE 10 MG PO TABS
5.0000 mg | ORAL_TABLET | Freq: Every day | ORAL | Status: DC
  Filled 2017-03-21: qty 1

## 2017-03-21 MED ORDER — DICYCLOMINE HCL 10 MG PO CAPS
10.0000 mg | ORAL_CAPSULE | Freq: Three times a day (TID) | ORAL | Status: DC | PRN
  Administered 2017-03-21 – 2017-03-22 (×2): 10 mg via ORAL
  Filled 2017-03-21 (×2): qty 1

## 2017-03-21 MED ORDER — ESCITALOPRAM OXALATE 20 MG PO TABS
20.0000 mg | ORAL_TABLET | Freq: Every day | ORAL | Status: DC
  Administered 2017-03-21 – 2017-03-24 (×4): 20 mg via ORAL
  Filled 2017-03-21 (×4): qty 1

## 2017-03-21 MED ORDER — ACETAMINOPHEN 650 MG RE SUPP: 650.0000 mg | Freq: Four times a day (QID) | RECTAL | Status: DC | PRN

## 2017-03-21 MED ORDER — DEXTROSE IN LACTATED RINGERS 5 % IV SOLN
INTRAVENOUS | Status: DC
  Administered 2017-03-21 – 2017-03-23 (×2): via INTRAVENOUS

## 2017-03-21 MED ORDER — METRONIDAZOLE IN NACL 5-0.79 MG/ML-% IV SOLN
500.0000 mg | Freq: Three times a day (TID) | INTRAVENOUS | Status: DC
Start: 2017-03-21 — End: 2017-03-24
  Administered 2017-03-21 – 2017-03-24 (×10): 500 mg via INTRAVENOUS
  Filled 2017-03-21 (×10): qty 100

## 2017-03-21 MED ORDER — MORPHINE SULFATE (PF) 4 MG/ML IV SOLN
4.0000 mg | Freq: Once | INTRAVENOUS | Status: AC
  Administered 2017-03-21: 4 mg via INTRAVENOUS
  Filled 2017-03-21: qty 1

## 2017-03-21 NOTE — Progress Notes (Signed)
PROGRESS NOTE    [name]  CVE:[id number] DOB: 11/23/1950 DOA: 03/20/2017 PCP: Wenda Low, MD    Brief Narrative:  67 year old female who presented with rectal bleeding. She does have significant past medical history hypertension, coronary disease, polycythemia, COPD, GERD. She had acute rectal bleeding, associated with nausea, vomiting and diarrhea, pain was colicky nature. On the initial physical examination blood pressure 146/67, heart rate 67, respiratory 20, temp 90.3, saturation 98%. Dry mucous membranes, lungs clear to auscultation bilaterally, heart S1-S2 present rhythmic, abdomen was soft, tender to palpation in all quadrants, no lower extremity edema. CT of the abdomen show pronounced thickening of the walls of the upper sigmoid colon and lower descending colon, suggesting colitis.   Patient admitted to the hospital working diagnosis of lower GI bleed due to colitis.   Assessment & Plan:   Principal Problem:   Colitis with rectal bleeding Active Problems:   ANXIETY DEPRESSION   Essential hypertension   Coronary artery disease involving native coronary artery of native heart with angina pectoris ([facility])   GERD (gastroesophageal reflux disease)   Polycythemia vera ([place])   1. Lower GI bleed due to colitis. Will continue conservative supportive medical therapy with IV antibiotic therapy ciprofloxacin and metronidazole, IV fluids, IV analgesics, antiacids and as needed antihemetics. If persistent bleeding may need endoscopic evaluation.   2. Polycythemia vera. Stable with no signs of thrombosis.  3. Hypertension. Continue blood pressure monitoring. Continue metoprolol and hctz  4. Coronary artery disease. Chest pain free, holding on asa.   5. Anxiety and depression. Continue alprazom, escitalopram,   6. Tobacco abuse. Smoking cessation, nicotine patch.   DVT prophylaxis: scd  Code Status:  full Family Communication:  No family at the bedside Disposition Plan:   home   Consultants:     Procedures:     Antimicrobials:       Subjective: Patient continue to have blood per rectum, pain has improved, but still persistent, no nausea or vomiting, no fever or chills.   Objective: Vitals:   03/21/17 0140 03/21/17 0344 03/21/17 0539 03/21/17 0909  BP: (!) 168/73 (!) 140/54 124/67 (!) 146/78  Pulse: (!) 59 (!) 56 (!) 58 69  Resp: 20 17 17    Temp: 98 F (36.7 C) 99.3 F (37.4 C) 98.2 F (36.8 C)   TempSrc: Oral Oral    SpO2: 95% 94% 96%   Weight:  51.4 kg (113 lb 5.1 oz)    Height:  5\' 1"  (1.549 m)      Intake/Output Summary (Last 24 hours) at 03/21/2017 1446 Last data filed at 03/21/2017 1019 Gross per 24 hour  Intake 300 ml  Output -  Net 300 ml   Filed Weights   03/20/17 1844 03/21/17 0344  Weight: 51.3 kg (113 lb) 51.4 kg (113 lb 5.1 oz)    Examination:   General: Not in pain or dyspnea, deconditioned Neurology: Awake and alert, non focal  E ENT: mild pallor, no icterus, oral mucosa moist Cardiovascular: No JVD. S1-S2 present, rhythmic, no gallops, rubs, or murmurs. No lower extremity edema. Pulmonary: vesicular breath sounds bilaterally, adequate air movement, no wheezing, rhonchi or rales. Gastrointestinal. Abdomen flat, no organomegaly, no rebound or guarding, tender to deep palpation, no rebound or guarding.  Skin. No rashes Musculoskeletal: no joint deformities     Data Reviewed: I have personally reviewed following labs and imaging studies  CBC: Recent Labs  Lab 03/20/17 1852 03/21/17 0029 03/21/17 0522  WBC 14.8* 11.9* 11.3*  NEUTROABS 11.8*  --   --  HGB 15.9* 13.7 13.3  HCT 46.7* 40.2 40.1  MCV 87.6 88.0 89.5  PLT 246 192 132   Basic Metabolic Panel: Recent Labs  Lab 03/20/17 1852 03/21/17 0522  NA 132* 135  K 4.1 3.1*  CL 92* 102  CO2 29 24  GLUCOSE 124* 118*  BUN 11 5*  CREATININE 0.77 0.63  CALCIUM 9.3 8.3*   GFR: Estimated Creatinine Clearance: 52.2 mL/min (by C-G formula based on  SCr of 0.63 mg/dL). Liver Function Tests: Recent Labs  Lab 03/20/17 1856  AST 33  ALT 14  ALKPHOS 71  BILITOT 1.0  PROT 7.9  ALBUMIN 4.5   Recent Labs  Lab 03/20/17 1856  LIPASE 29   No results for input(s): AMMONIA in the last 168 hours. Coagulation Profile: Recent Labs  Lab 03/20/17 1856  INR 0.97   Cardiac Enzymes: No results for input(s): CKTOTAL, CKMB, CKMBINDEX, TROPONINI in the last 168 hours. BNP (last 3 results) No results for input(s): PROBNP in the last 8760 hours. HbA1C: No results for input(s): HGBA1C in the last 72 hours. CBG: No results for input(s): GLUCAP in the last 168 hours. Lipid Profile: No results for input(s): CHOL, HDL, LDLCALC, TRIG, CHOLHDL, LDLDIRECT in the last 72 hours. Thyroid Function Tests: No results for input(s): TSH, T4TOTAL, FREET4, T3FREE, THYROIDAB in the last 72 hours. Anemia Panel: No results for input(s): VITAMINB12, FOLATE, FERRITIN, TIBC, IRON, RETICCTPCT in the last 72 hours.    Radiology Studies: I have reviewed all of the imaging during this hospital visit personally     Scheduled Meds: . escitalopram  5 mg Oral Daily  . hydrochlorothiazide  12.5 mg Oral Daily  . metoprolol succinate  100 mg Oral Daily  . pantoprazole  40 mg Oral Daily   Continuous Infusions: . sodium chloride 100 mL/hr at 03/21/17 0505  . ciprofloxacin    . metronidazole Stopped (03/21/17 0955)     LOS: 0 days        [name] Gerome Apley, MD [facility] Pager [phone]

## 2017-03-21 NOTE — Progress Notes (Signed)
Patient had four small BM with blood today. Hemoglobin stable, no signs of dizziness or weakness. Patient reports abdominal pain. Treating with pain medication.

## 2017-03-21 NOTE — Plan of Care (Signed)
[facility] transfer discussed with Dr. Harrell Gave Tegeler.  [name] is a 67 year old with past medical history significant of HTN, CAD, polycythemia, COPD, anxiety, PVD, and GERD; who presents with complaints of lower abdominal pain, bright red blood diarrhea, nausea, and vomiting. CT scan of abdomen and pelvis showing colitis. Started on Cipro and Flagyl.  Concern for drop in hemoglobin from 15.9-13.7 on repeat lab work.  Transfer to a telemetry bed inpatient.

## 2017-03-21 NOTE — ED Notes (Signed)
Pt given ice chips

## 2017-03-21 NOTE — H&P (Signed)
History and Physical    CADENCE MINTON HKV:[id number] DOB: [birth year] DOA: 03/20/2017  Referring MD/NP/PA: Dr. Harrell Gave Tegeler PCP: Wenda Low, MD  Patient coming from: Transfer from [facility]   Chief Complaint: Rectal bleeding  I have personally briefly reviewed patient's old medical records in [place]   HPI: [name] is a 67 y.o. female with medical history significant of HTN, CAD, polycythemia, COPD, anxiety, PVD, and GERD; who presents with complaints of lower abdominal pain with rectal bleeding since around 3 AM yesterday morning.  Reports having at least one episode of nausea with vomiting, and diarrhea early this morning. Since that time she complains of having intermittent crampy abdominal pain with bright red blood per rectum every 15-20 minutes or so when having to use the restroom.  Associated symptoms include complaints of gas. Denies any recent use of antibiotics or history of C. difficile.    ED Course: Upon admission into the emergency department patient was seen to be afebrile, pulse 86-67, respirations 17-20, blood pressure elevated up to 202/86, and O2 saturation maintained on room air.  Labs revealed WBC 14.8. CT scan of abdomen and pelvis showing  signs of colitis.  UA negative for signs of infection.  Patient was started on Cipro and Flagyl.  Concern for drop in hemoglobin from 15.9-13.7 on repeat lab work.  Transfer to a telemetry bed inpatient.    Review of Systems  Constitutional: Positive for malaise/fatigue. Negative for chills, fever and weight loss.  HENT: Negative for congestion and ear discharge.   Eyes: Negative for double vision and discharge.  Respiratory: Negative for cough, sputum production and shortness of breath.   Cardiovascular: Negative for chest pain, orthopnea and leg swelling.  Gastrointestinal: Positive for abdominal pain, blood in stool, nausea and vomiting.  Genitourinary: Negative for frequency.  Musculoskeletal:  Negative for falls and myalgias.  Skin: Negative for itching and rash.  Neurological: Negative for focal weakness and loss of consciousness.  Endo/Heme/Allergies: Does not bruise/bleed easily.  Psychiatric/Behavioral: Negative for hallucinations and substance abuse. The patient is not nervous/anxious.     Past Medical History:  Diagnosis Date  . Anxiety   . Arthritis   . CAD (coronary artery disease)   . COPD (chronic obstructive pulmonary disease) ([place])   . Depression   . Dyspnea   . GERD (gastroesophageal reflux disease)   . Hepatitis C   . Hypertension   . Peripheral vascular disease ([place])   . Polycythemia 2009  . Seizures ([place]) 1970   x1- pt. reports that there was never any causative agent      Past Surgical History:  Procedure Laterality Date  . 2 stints  Aug 26,2010   Dr. Tawnya Crook  . ABDOMINAL SURGERY  1970's   for excessive bleeding from loss of pregnancy, required blood transfusion post procedure   . cartoid endartherectomy  2007  . CORONARY ANGIOPLASTY WITH STENT PLACEMENT  10/09/2008  . ENDARTERECTOMY Left 07/24/2016   Procedure: ENDARTERECTOMY CAROTID;  Surgeon: [name], MD;  Location: [facility];  Service: Vascular;  Laterality: Left;  . LYMPH NODE DISSECTION Left 1990's   benign   . TUBAL LIGATION       reports that she has quit smoking. Her smoking use included cigarettes. She started smoking about 49 years ago. She has a 12.00 pack-year smoking history. she has never used smokeless tobacco. She reports that she drinks alcohol. She reports that she does not use drugs.  Allergies  Allergen Reactions  .  Diphenhydramine Hcl Hives  . Bactrim [Sulfamethoxazole-Trimethoprim] Nausea And Vomiting    Family History  Problem Relation Age of Onset  . Heart disease Mother   . Heart disease Father     Prior to Admission medications   Medication Sig Start Date End Date Taking? Authorizing Provider  acetaminophen (TYLENOL) 500 MG tablet Take 500-1,000 mg by mouth  every 6 (six) hours as needed for mild pain or headache (depends on pain if takes 1-2 tablets).     [provider]  albuterol (PROVENTIL HFA;VENTOLIN HFA) 108 (90 BASE) MCG/ACT inhaler Inhale 2 puffs into the lungs every 6 (six) hours as needed for wheezing or shortness of breath. 02/21/14   Gregor Hams, MD  ALPRAZolam Duanne Moron) 0.5 MG tablet Take 0.5 mg by mouth at bedtime.  11/27/12   [provider]  Ascorbic Acid (VITAMIN C PO) Take 2 tablets by mouth daily.    [provider]  aspirin EC 81 MG tablet Take 81 mg by mouth daily.    [provider]  Biotin 1000 MCG tablet Take 1,000 mcg by mouth daily.    [provider]  clopidogrel (PLAVIX) 75 MG tablet TAKE 1 TABLET BY MOUTH ONCE DAILY 01/22/17   Belva Crome, MD  clotrimazole-betamethasone (LOTRISONE) cream use 1 application to affected area as needed for sore areas on body 12/12/12   Marletta Lor, MD  escitalopram (LEXAPRO) 5 MG tablet Take 5 mg by mouth.    [provider]  hydrochlorothiazide (MICROZIDE) 12.5 MG capsule TAKE 1 CAPSULE BY MOUTH ONCE DAILY 06/12/16   Belva Crome, MD  lansoprazole (PREVACID) 30 MG capsule Take 30 mg by mouth daily as needed (for ulcer/acid reflex). Ulcer/ acid reflux    [provider]  metoprolol succinate (TOPROL-XL) 100 MG 24 hr tablet TAKE 1 & 1/2 (ONE & ONE-HALF) TABLETS BY MOUTH ONCE DAILY 11/30/16   Belva Crome, MD  mupirocin ointment (BACTROBAN) 2 % Apply twice a day to right arm wound Patient taking differently: Apply 1 application topically as needed.  06/28/16   [name], MD  nitroGLYCERIN (NITROSTAT) 0.4 MG SL tablet Place 1 tablet (0.4 mg total) under the tongue every 5 (five) minutes as needed for chest pain. 03/09/15   Brett Canales, PA-C  simvastatin (ZOCOR) 10 MG tablet Take 10 mg by mouth every other day.  06/12/14   [provider]  vitamin E 400 UNIT capsule Take 400 Units by mouth daily.    [provider]    Physical Exam:  Constitutional: Elderly female who appears to be in some moderate discomfort Vitals:   03/20/17 2101 03/20/17 2306 03/21/17 0140 03/21/17 0344  BP: (!) 202/86 (!) 146/67 (!) 168/73 (!) 140/54  Pulse: 63 67 (!) 59 (!) 56  Resp: 20 20 20 17   Temp:  98.3 F (36.8 C) 98 F (36.7 C) 99.3 F (37.4 C)  TempSrc:  Oral Oral Oral  SpO2: 96% 98% 95% 94%  Weight:    51.4 kg (113 lb 5.1 oz)  Height:    5\' 1"  (1.549 m)   Eyes: PERRL, lids and conjunctivae normal ENMT: Mucous membranes are dry. Posterior pharynx clear of any exudate or lesions.  Neck: normal, supple, no masses, no thyromegaly Respiratory: clear to auscultation bilaterally, no wheezing, no crackles. Normal respiratory effort. No accessory muscle use.  Cardiovascular: Regular rate and rhythm, no murmurs / rubs / gallops. No extremity edema. 2+ pedal pulses. No carotid bruits.  Abdomen: Tenderness to palpation of the lower quadrants. Positive bowel sounds.  No significant distention noted.  Musculoskeletal: no clubbing / cyanosis. No joint deformity upper and lower extremities. Good ROM, no contractures. Normal muscle tone.  Skin: no rashes, lesions, ulcers. No induration Neurologic: CN 2-12 grossly intact. Sensation intact, DTR normal. Strength 5/5 in all 4.  Psychiatric: Normal judgment and insight. Alert and oriented x 3. Normal mood.     Labs on Admission: I have personally reviewed following labs and imaging studies  CBC: Recent Labs  Lab 03/20/17 1852 03/21/17 0029  WBC 14.8* 11.9*  NEUTROABS 11.8*  --   HGB 15.9* 13.7  HCT 46.7* 40.2  MCV 87.6 88.0  PLT 246 956   Basic Metabolic Panel: Recent Labs  Lab 03/20/17 1852  NA 132*  K 4.1  CL 92*  CO2 29  GLUCOSE 124*  BUN 11  CREATININE 0.77  CALCIUM 9.3   GFR: Estimated Creatinine Clearance: 52.2 mL/min (by C-G formula based on SCr of 0.77 mg/dL). Liver Function Tests: Recent Labs  Lab 03/20/17 1856  AST 33  ALT 14    ALKPHOS 71  BILITOT 1.0  PROT 7.9  ALBUMIN 4.5   Recent Labs  Lab 03/20/17 1856  LIPASE 29   No results for input(s): AMMONIA in the last 168 hours. Coagulation Profile: Recent Labs  Lab 03/20/17 1856  INR 0.97   Cardiac Enzymes: No results for input(s): CKTOTAL, CKMB, CKMBINDEX, TROPONINI in the last 168 hours. BNP (last 3 results) No results for input(s): PROBNP in the last 8760 hours. HbA1C: No results for input(s): HGBA1C in the last 72 hours. CBG: No results for input(s): GLUCAP in the last 168 hours. Lipid Profile: No results for input(s): CHOL, HDL, LDLCALC, TRIG, CHOLHDL, LDLDIRECT in the last 72 hours. Thyroid Function Tests: No results for input(s): TSH, T4TOTAL, FREET4, T3FREE, THYROIDAB in the last 72 hours. Anemia Panel: No results for input(s): VITAMINB12, FOLATE, FERRITIN, TIBC, IRON, RETICCTPCT in the last 72 hours. Urine analysis:    Component Value Date/Time   COLORURINE COLORLESS (A) 03/20/2017 2207   APPEARANCEUR CLEAR 03/20/2017 2207   LABSPEC <1.005 (L) 03/20/2017 2207   PHURINE 5.5 03/20/2017 2207   GLUCOSEU NEGATIVE 03/20/2017 2207   HGBUR NEGATIVE 03/20/2017 2207   HGBUR negative 01/19/2009 0000   BILIRUBINUR NEGATIVE 03/20/2017 2207   BILIRUBINUR 1+ 08/10/2010 [place] 03/20/2017 2207   PROTEINUR NEGATIVE 03/20/2017 2207   UROBILINOGEN 0.2 08/10/2010 1244   UROBILINOGEN 0.2 07/09/2009 2306   NITRITE NEGATIVE 03/20/2017 2207   LEUKOCYTESUR NEGATIVE 03/20/2017 2207   Sepsis Labs: No results found for this or any previous visit (from the past 240 hour(s)).   Radiological Exams on Admission: Ct Abdomen Pelvis W Contrast  Result Date: 03/21/2017 CLINICAL DATA:  Nausea and vomiting, bright red diarrhea for 21 hours. History of hepatitis C and rectus muscle hematoma. Diverticulitis suspected. EXAM: CT ABDOMEN AND PELVIS WITH CONTRAST TECHNIQUE: Multidetector CT imaging of the abdomen and pelvis was performed using the  standard protocol following bolus administration of intravenous contrast. CONTRAST:  100 cc Isovue 300 COMPARISON:  CT dated 01/09/2017. FINDINGS: Lower chest: No acute abnormality. Hepatobiliary: No focal liver abnormality is seen. No gallstones, gallbladder wall thickening, or biliary dilatation. Pancreas: Unremarkable. No pancreatic ductal dilatation or surrounding inflammatory changes. Spleen: Normal in size without focal abnormality. Adrenals/Urinary Tract: Adrenal glands appear normal. Kidneys appear normal without mass, stone or hydronephrosis. No obstructing ureteral calculi. Bladder appears normal. Stomach/Bowel: Thickening of the  walls of the upper sigmoid colon and lower descending colon. The extent of the involvement suggests colitis of infectious or inflammatory nature. There is scattered diverticulosis within the sigmoid colon but no focal inflammatory changes to suggest acute diverticulitis. No dilated large or small bowel loops.  Stomach is unremarkable. Vascular/Lymphatic: Aortic atherosclerosis. No enlarged abdominal or pelvic lymph nodes. Reproductive: Uterus and bilateral adnexa are unremarkable. Other: Pericolonic fluid stranding at the levels of the colonic wall thickening. No abscess collection identified. No free intraperitoneal air. Musculoskeletal: Mild degenerative change within the lower lumbar spine. No acute or suspicious osseous finding. IMPRESSION: 1. Pronounced thickening of the walls of the upper sigmoid colon and lower descending colon. The fairly long extent of involvement suggests colitis of infectious or inflammatory nature. 2. Associated pericolonic inflammation/fluid stranding. No abscess collection seen. No free intraperitoneal air. 3. Colonic diverticulosis without evidence of acute diverticulitis. 4. Aortic atherosclerosis. Electronically Signed   By: Franki Cabot M.D.   On: 03/21/2017 00:16    EKG: Independently reviewed.  Normal sinus rhythm at 77  bpm  Assessment/Plan Colitis with rectal bleeding: Acute.  Patient presents with lower abdominal pain with bright red blood per rectum.  Hemoglobin - Admit to a telemetry bed - Type and screen for possible need of blood products - Monitor intake and output - Continue empiric antibiotics of ciprofloxacin and Flagyl - IV fluids normal saline at 100 mL/h - Recheck CBC this a.m.   Polycythemia vera: Patient normally requires routine phlebotomy.  Initial hemoglobin 15.9 with repeat check 13.7. - Continue to monitor essential hypertension  Essential hypertension - Continue metoprolol and hydrochlorothiazide   CAD - Restart Plavix when medically appropriate  Anxiety/depression - Continue Lexapro and Xanax prn  GERD - continue pharmacy substitution of Protonix   DVT prophylaxis: SCD  Code Status: Full Family Communication: No family present at bedside Disposition Plan: TBD  Consults called: none  Admission status: inpatient  Norval Morton MD [facility] Pager [phone]   If 7PM-7AM, please contact night-coverage [email or web address] Password [email or web address]  03/21/2017, 4:04 AM

## 2017-03-21 NOTE — H&P (View-Only) (Signed)
PROGRESS NOTE    [name]  KNL:[id number] DOB: 1951/01/10 DOA: 03/20/2017 PCP: Wenda Low, MD    Brief Narrative:  67 year old female who presented with rectal bleeding. She does have significant past medical history hypertension, coronary disease, polycythemia, COPD, GERD. She had acute rectal bleeding, associated with nausea, vomiting and diarrhea, pain was colicky nature. On the initial physical examination blood pressure 146/67, heart rate 67, respiratory 20, temp 90.3, saturation 98%. Dry mucous membranes, lungs clear to auscultation bilaterally, heart S1-S2 present rhythmic, abdomen was soft, tender to palpation in all quadrants, no lower extremity edema. CT of the abdomen show pronounced thickening of the walls of the upper sigmoid colon and lower descending colon, suggesting colitis.   Patient admitted to the hospital working diagnosis of lower GI bleed due to colitis.   Assessment & Plan:   Principal Problem:   Colitis with rectal bleeding Active Problems:   ANXIETY DEPRESSION   Essential hypertension   Coronary artery disease involving native coronary artery of native heart with angina pectoris (HCC)   GERD (gastroesophageal reflux disease)   Polycythemia vera ([place])   1. Lower GI bleed due to colitis. Will continue conservative supportive medical therapy with IV antibiotic therapy ciprofloxacin and metronidazole, IV fluids, IV analgesics, antiacids and as needed antihemetics. If persistent bleeding may need endoscopic evaluation.   2. Polycythemia vera. Stable with no signs of thrombosis.  3. Hypertension. Continue blood pressure monitoring. Continue metoprolol and hctz  4. Coronary artery disease. Chest pain free, holding on asa.   5. Anxiety and depression. Continue alprazom, escitalopram,   6. Tobacco abuse. Smoking cessation, nicotine patch.   DVT prophylaxis: scd  Code Status:  full Family Communication:  No family at the bedside Disposition Plan:   home   Consultants:     Procedures:     Antimicrobials:       Subjective: Patient continue to have blood per rectum, pain has improved, but still persistent, no nausea or vomiting, no fever or chills.   Objective: Vitals:   03/21/17 0140 03/21/17 0344 03/21/17 0539 03/21/17 0909  BP: (!) 168/73 (!) 140/54 124/67 (!) 146/78  Pulse: (!) 59 (!) 56 (!) 58 69  Resp: 20 17 17    Temp: 98 F (36.7 C) 99.3 F (37.4 C) 98.2 F (36.8 C)   TempSrc: Oral Oral    SpO2: 95% 94% 96%   Weight:  51.4 kg (113 lb 5.1 oz)    Height:  5\' 1"  (1.549 m)      Intake/Output Summary (Last 24 hours) at 03/21/2017 1446 Last data filed at 03/21/2017 1019 Gross per 24 hour  Intake 300 ml  Output -  Net 300 ml   Filed Weights   03/20/17 1844 03/21/17 0344  Weight: 51.3 kg (113 lb) 51.4 kg (113 lb 5.1 oz)    Examination:   General: Not in pain or dyspnea, deconditioned Neurology: Awake and alert, non focal  E ENT: mild pallor, no icterus, oral mucosa moist Cardiovascular: No JVD. S1-S2 present, rhythmic, no gallops, rubs, or murmurs. No lower extremity edema. Pulmonary: vesicular breath sounds bilaterally, adequate air movement, no wheezing, rhonchi or rales. Gastrointestinal. Abdomen flat, no organomegaly, no rebound or guarding, tender to deep palpation, no rebound or guarding.  Skin. No rashes Musculoskeletal: no joint deformities     Data Reviewed: I have personally reviewed following labs and imaging studies  CBC: Recent Labs  Lab 03/20/17 1852 03/21/17 0029 03/21/17 0522  WBC 14.8* 11.9* 11.3*  NEUTROABS 11.8*  --   --  HGB 15.9* 13.7 13.3  HCT 46.7* 40.2 40.1  MCV 87.6 88.0 89.5  PLT 246 192 681   Basic Metabolic Panel: Recent Labs  Lab 03/20/17 1852 03/21/17 0522  NA 132* 135  K 4.1 3.1*  CL 92* 102  CO2 29 24  GLUCOSE 124* 118*  BUN 11 5*  CREATININE 0.77 0.63  CALCIUM 9.3 8.3*   GFR: Estimated Creatinine Clearance: 52.2 mL/min (by C-G formula based on  SCr of 0.63 mg/dL). Liver Function Tests: Recent Labs  Lab 03/20/17 1856  AST 33  ALT 14  ALKPHOS 71  BILITOT 1.0  PROT 7.9  ALBUMIN 4.5   Recent Labs  Lab 03/20/17 1856  LIPASE 29   No results for input(s): AMMONIA in the last 168 hours. Coagulation Profile: Recent Labs  Lab 03/20/17 1856  INR 0.97   Cardiac Enzymes: No results for input(s): CKTOTAL, CKMB, CKMBINDEX, TROPONINI in the last 168 hours. BNP (last 3 results) No results for input(s): PROBNP in the last 8760 hours. HbA1C: No results for input(s): HGBA1C in the last 72 hours. CBG: No results for input(s): GLUCAP in the last 168 hours. Lipid Profile: No results for input(s): CHOL, HDL, LDLCALC, TRIG, CHOLHDL, LDLDIRECT in the last 72 hours. Thyroid Function Tests: No results for input(s): TSH, T4TOTAL, FREET4, T3FREE, THYROIDAB in the last 72 hours. Anemia Panel: No results for input(s): VITAMINB12, FOLATE, FERRITIN, TIBC, IRON, RETICCTPCT in the last 72 hours.    Radiology Studies: I have reviewed all of the imaging during this hospital visit personally     Scheduled Meds: . escitalopram  5 mg Oral Daily  . hydrochlorothiazide  12.5 mg Oral Daily  . metoprolol succinate  100 mg Oral Daily  . pantoprazole  40 mg Oral Daily   Continuous Infusions: . sodium chloride 100 mL/hr at 03/21/17 0505  . ciprofloxacin    . metronidazole Stopped (03/21/17 0955)     LOS: 0 days        Orabelle Rylee Gerome Apley, MD [facility] Pager [phone]

## 2017-03-22 ENCOUNTER — Encounter (HOSPITAL_COMMUNITY)
Admission: EM | Disposition: A | Discharge status: Home / Self Care | Payer: Self-pay | Source: Home / Self Care | Attending: Internal Medicine

## 2017-03-22 ENCOUNTER — Encounter (HOSPITAL_COMMUNITY): Payer: Self-pay

## 2017-03-22 DIAGNOSIS — K922 Gastrointestinal hemorrhage, unspecified: Secondary | ICD-10-CM

## 2017-03-22 DIAGNOSIS — K219 Gastro-esophageal reflux disease without esophagitis: Secondary | ICD-10-CM

## 2017-03-22 HISTORY — PX: FLEXIBLE SIGMOIDOSCOPY: SHX5431

## 2017-03-22 LAB — CBC WITH DIFFERENTIAL/PLATELET
Basophils Absolute: 0 10*3/uL (ref 0.0–0.1)
Basophils Relative: 0 %
Eosinophils Absolute: 0.1 10*3/uL (ref 0.0–0.7)
Eosinophils Relative: 1 %
HEMATOCRIT: 36.3 % (ref 36.0–46.0)
HEMOGLOBIN: 12 g/dL (ref 12.0–15.0)
LYMPHS ABS: 1.9 10*3/uL (ref 0.7–4.0)
LYMPHS PCT: 20 %
MCH: 30.5 pg (ref 26.0–34.0)
MCHC: 33.1 g/dL (ref 30.0–36.0)
MCV: 92.1 fL (ref 78.0–100.0)
MONOS PCT: 7 %
Monocytes Absolute: 0.7 10*3/uL (ref 0.1–1.0)
NEUTROS ABS: 6.5 10*3/uL (ref 1.7–7.7)
NEUTROS PCT: 72 %
Platelets: 170 10*3/uL (ref 150–400)
RBC: 3.94 MIL/uL (ref 3.87–5.11)
RDW: 14.4 % (ref 11.5–15.5)
WBC: 9.1 10*3/uL (ref 4.0–10.5)

## 2017-03-22 LAB — BASIC METABOLIC PANEL
Anion gap: 6 (ref 5–15)
BUN: <5 mg/dL — ABNORMAL LOW (ref 6–20)
CHLORIDE: 103 mmol/L (ref 101–111)
CO2: 29 mmol/L (ref 22–32)
Calcium: 8.6 mg/dL — ABNORMAL LOW (ref 8.9–10.3)
Creatinine, Ser: 0.64 mg/dL (ref 0.44–1.00)
GFR calc non Af Amer: >60 mL/min (ref >60)
Glucose, Bld: 136 mg/dL — ABNORMAL HIGH (ref 65–99)
POTASSIUM: 4.2 mmol/L (ref 3.5–5.1)
SODIUM: 138 mmol/L (ref 135–145)

## 2017-03-22 LAB — URINE CULTURE: Culture: NO GROWTH

## 2017-03-22 SURGERY — SIGMOIDOSCOPY, FLEXIBLE
Anesthesia: Moderate Sedation

## 2017-03-22 MED ORDER — MIDAZOLAM HCL 5 MG/ML IJ SOLN
INTRAMUSCULAR | Status: AC
  Filled 2017-03-22: qty 2

## 2017-03-22 MED ORDER — FLEET ENEMA 7-19 GM/118ML RE ENEM
2.0000 | ENEMA | Freq: Once | RECTAL | Status: AC
  Administered 2017-03-22: 2 via RECTAL
  Filled 2017-03-22: qty 2

## 2017-03-22 MED ORDER — FENTANYL CITRATE (PF) 100 MCG/2ML IJ SOLN
INTRAMUSCULAR | Status: DC | PRN
  Administered 2017-03-22 (×2): 25 ug via INTRAVENOUS

## 2017-03-22 MED ORDER — SODIUM CHLORIDE 0.9 % IV SOLN: INTRAVENOUS | Status: DC

## 2017-03-22 MED ORDER — FENTANYL CITRATE (PF) 100 MCG/2ML IJ SOLN
INTRAMUSCULAR | Status: AC
  Filled 2017-03-22: qty 2

## 2017-03-22 MED ORDER — ENALAPRILAT 1.25 MG/ML IV SOLN
1.2500 mg | Freq: Once | INTRAVENOUS | Status: AC
  Administered 2017-03-22: 1.25 mg via INTRAVENOUS
  Filled 2017-03-22: qty 1

## 2017-03-22 MED ORDER — MIDAZOLAM HCL 10 MG/2ML IJ SOLN
INTRAMUSCULAR | Status: DC | PRN
  Administered 2017-03-22 (×2): 2 mg via INTRAVENOUS

## 2017-03-22 NOTE — Progress Notes (Cosign Needed)
PROGRESS NOTE  ELOYCE BULTMAN PYK:[id number] DOB: Aug 05, 1950 DOA: 03/20/2017 PCP: Wenda Low, MD   LOS: 1 day   Brief Narrative / Interim history: 67 y.o female with history of hypertension, coronary disease, tobacco abuse, polycythemia vera, COPD, GERD, colon adenomas, and a GI bleed requiring hospitalization in 2013 presented to the ED for rectal bleeding accompanied by N/V and colicky pain in the lower abdomen. Denied chest pain, fever, palpitations, fatigue, cough, sob, leg edema, and dysuria. CT of the abdomen revealed pronounced thickening of the walls of the upper sigmoid colon and lower descending colon, suggesting colitis. Last colonoscopy in 2016 revealed multiple benign colon polyps and mild diverticulosis in sigmoid.   Patient was admitted to the hospital with working diagnosis of lower GI bleed due to colitis  Rectal bleeding persistant. GI called.   Assessment & Plan: Principal Problem:   Colitis with rectal bleeding Active Problems:   ANXIETY DEPRESSION   Essential hypertension   Coronary artery disease involving native coronary artery of native heart with angina pectoris (HCC)   GERD (gastroesophageal reflux disease)   Polycythemia vera (HCC)   Lower GI bleed due to colitis Bleeding from rectum persistent,  2 events this am and 4 overnight.  Patient on soft diet. Last bowel movement was Early Tuesday morning.  Patient positive tenderness in lower abdomen. Continue Bentyl PRN HGB stable at 12.0 Will consult GI, recommendations appreciated Continue Flagyl/cipro  Polycythemia vera Patient follows up with oncologist outpatient every 8 months Patient stable   HTN B/P improving Continue metroprolol, HCTZ  Tobacco abuse Smoking cessation Continue nicotine patch  DVT prophylaxis: scd Code Status: FULL Family Communication: none Disposition Plan: home  Consultants:   GI  Procedures:   none  Antimicrobials:  Flagyl/Cipro  Subjective: Patient  alert sitting in bed eating. This morning she reported 2 events of rectal bleeding with persistent discomfort in lower abdomen. Patient recovering from cold symptoms/cough.  Objective: Vitals:   03/21/17 0909 03/21/17 1538 03/21/17 2307 03/22/17 0502  BP: (!) 146/78 (!) 149/49 (!) 166/57 (!) 131/44  Pulse: 69 60 67 63  Resp:  16 16 20   Temp:  98.2 F (36.8 C) 98.7 F (37.1 C) 98.8 F (37.1 C)  TempSrc:  Oral  Oral  SpO2:  96% 97% 96%  Weight:      Height:        Intake/Output Summary (Last 24 hours) at 03/22/2017 1022 Last data filed at 03/22/2017 0503 Gross per 24 hour  Intake 2145 ml  Output 200 ml  Net 1945 ml   Filed Weights   03/20/17 1844 03/21/17 0344  Weight: 51.3 kg (113 lb) 51.4 kg (113 lb 5.1 oz)    Examination:  Constitutional: NAD Eyes: PERRL, lids and conjunctivae normal ENMT: Mucous membranes are moist. No oropharyngeal exudates Neck: normal, supple Respiratory: clear to auscultation bilaterally, no wheezing, no crackles. Normal respiratory effort. No accessory muscle use.  Cardiovascular: Regular rate and rhythm, no murmurs / rubs / gallops. No LE edema. 2+ pedal pulses.  Abdomen: TTP lower abdomen. Bowel sounds positive.  Musculoskeletal: . No joint deformity upper and lower extremities. No contractures. Normal muscle tone.  Skin: no rashes, lesions, ulcers.  Neurologic: CN 2-12 grossly intact. Strength 5/5 in all 4.  Psychiatric: Normal judgment and insight. Alert and oriented x 3. Normal mood.    Data Reviewed: I have independently reviewed following labs and imaging studies   CBC: Recent Labs  Lab 03/20/17 1852 03/21/17 0029 03/21/17 0522 03/22/17 0526  WBC  14.8* 11.9* 11.3* 9.1  NEUTROABS 11.8*  --   --  6.5  HGB 15.9* 13.7 13.3 12.0  HCT 46.7* 40.2 40.1 36.3  MCV 87.6 88.0 89.5 92.1  PLT 246 192 191 409   Basic Metabolic Panel: Recent Labs  Lab 03/20/17 1852 03/21/17 0522 03/22/17 0526  NA 132* 135 138  K 4.1 3.1* 4.2  CL 92*  102 103  CO2 29 24 29   GLUCOSE 124* 118* 136*  BUN 11 5* <5*  CREATININE 0.77 0.63 0.64  CALCIUM 9.3 8.3* 8.6*   GFR: Estimated Creatinine Clearance: 52.2 mL/min (by C-G formula based on SCr of 0.64 mg/dL). Liver Function Tests: Recent Labs  Lab 03/20/17 1856  AST 33  ALT 14  ALKPHOS 71  BILITOT 1.0  PROT 7.9  ALBUMIN 4.5   Recent Labs  Lab 03/20/17 1856  LIPASE 29   No results for input(s): AMMONIA in the last 168 hours. Coagulation Profile: Recent Labs  Lab 03/20/17 1856  INR 0.97   Cardiac Enzymes: No results for input(s): CKTOTAL, CKMB, CKMBINDEX, TROPONINI in the last 168 hours. BNP (last 3 results) No results for input(s): PROBNP in the last 8760 hours. HbA1C: No results for input(s): HGBA1C in the last 72 hours. CBG: No results for input(s): GLUCAP in the last 168 hours. Lipid Profile: No results for input(s): CHOL, HDL, LDLCALC, TRIG, CHOLHDL, LDLDIRECT in the last 72 hours. Thyroid Function Tests: No results for input(s): TSH, T4TOTAL, FREET4, T3FREE, THYROIDAB in the last 72 hours. Anemia Panel: No results for input(s): VITAMINB12, FOLATE, FERRITIN, TIBC, IRON, RETICCTPCT in the last 72 hours. Urine analysis:    Component Value Date/Time   COLORURINE COLORLESS (A) 03/20/2017 2207   APPEARANCEUR CLEAR 03/20/2017 2207   LABSPEC <1.005 (L) 03/20/2017 2207   PHURINE 5.5 03/20/2017 2207   GLUCOSEU NEGATIVE 03/20/2017 2207   HGBUR NEGATIVE 03/20/2017 2207   HGBUR negative 01/19/2009 0000   BILIRUBINUR NEGATIVE 03/20/2017 2207   BILIRUBINUR 1+ 08/10/2010 [place] 03/20/2017 2207   PROTEINUR NEGATIVE 03/20/2017 2207   UROBILINOGEN 0.2 08/10/2010 1244   UROBILINOGEN 0.2 07/09/2009 2306   NITRITE NEGATIVE 03/20/2017 2207   LEUKOCYTESUR NEGATIVE 03/20/2017 2207   Sepsis Labs: Invalid input(s): PROCALCITONIN, LACTICIDVEN  Recent Results (from the past 240 hour(s))  Urine culture     Status: None   Collection Time: 03/20/17 10:07 PM    Result Value Ref Range Status   Specimen Description URINE, RANDOM  Final   Special Requests NONE  Final   Culture   Final    NO GROWTH Performed at [facility], [address] [place], [place] [address]    Report Status 03/22/2017 FINAL  Final  Blood culture (routine x 2)     Status: None (Preliminary result)   Collection Time: 03/21/17  1:20 AM  Result Value Ref Range Status   Specimen Description   Final    BLOOD RIGHT FOREARM Performed at [facility], [place] [address], [place], [place] [address]    Special Requests   Final    BOTTLES DRAWN AEROBIC AND ANAEROBIC Blood Culture adequate volume   Culture PENDING  Incomplete   Report Status PENDING  Incomplete      Radiology Studies: Ct Abdomen Pelvis W Contrast  Result Date: 03/21/2017 CLINICAL DATA:  Nausea and vomiting, bright red diarrhea for 21 hours. History of hepatitis C and rectus muscle hematoma. Diverticulitis suspected. EXAM: CT ABDOMEN AND PELVIS WITH CONTRAST TECHNIQUE: Multidetector CT imaging  of the abdomen and pelvis was performed using the standard protocol following bolus administration of intravenous contrast. CONTRAST:  100 cc Isovue 300 COMPARISON:  CT dated 01/09/2017. FINDINGS: Lower chest: No acute abnormality. Hepatobiliary: No focal liver abnormality is seen. No gallstones, gallbladder wall thickening, or biliary dilatation. Pancreas: Unremarkable. No pancreatic ductal dilatation or surrounding inflammatory changes. Spleen: Normal in size without focal abnormality. Adrenals/Urinary Tract: Adrenal glands appear normal. Kidneys appear normal without mass, stone or hydronephrosis. No obstructing ureteral calculi. Bladder appears normal. Stomach/Bowel: Thickening of the walls of the upper sigmoid colon and lower descending colon. The extent of the involvement suggests colitis of infectious or inflammatory nature. There is scattered diverticulosis within the sigmoid colon but no focal inflammatory changes  to suggest acute diverticulitis. No dilated large or small bowel loops.  Stomach is unremarkable. Vascular/Lymphatic: Aortic atherosclerosis. No enlarged abdominal or pelvic lymph nodes. Reproductive: Uterus and bilateral adnexa are unremarkable. Other: Pericolonic fluid stranding at the levels of the colonic wall thickening. No abscess collection identified. No free intraperitoneal air. Musculoskeletal: Mild degenerative change within the lower lumbar spine. No acute or suspicious osseous finding. IMPRESSION: 1. Pronounced thickening of the walls of the upper sigmoid colon and lower descending colon. The fairly long extent of involvement suggests colitis of infectious or inflammatory nature. 2. Associated pericolonic inflammation/fluid stranding. No abscess collection seen. No free intraperitoneal air. 3. Colonic diverticulosis without evidence of acute diverticulitis. 4. Aortic atherosclerosis. Electronically Signed   By: Franki Cabot M.D.   On: 03/21/2017 00:16     Scheduled Meds: . escitalopram  20 mg Oral Daily  . hydrochlorothiazide  12.5 mg Oral Daily  . lactobacillus acidophilus  2 tablet Oral TID  . metoprolol succinate  100 mg Oral Daily  . nicotine  21 mg Transdermal Daily  . pantoprazole  40 mg Oral Daily   Continuous Infusions: . ciprofloxacin 400 mg (03/22/17 0503)  . dextrose 5% lactated ringers 50 mL/hr at 03/21/17 1837  . metronidazole Stopped (03/22/17 1041)       Time spent:     Mistee Soliman Kathlen Mody, PA-S

## 2017-03-22 NOTE — Interval H&P Note (Signed)
History and Physical Interval Note:  03/22/2017 3:06 PM  [name]  has presented today for surgery, with the diagnosis of Hematochezia  The various methods of treatment have been discussed with the patient and family. After consideration of risks, benefits and other options for treatment, the patient has consented to  Procedure(s): FLEXIBLE SIGMOIDOSCOPY (N/A) as a surgical intervention .  The patient's history has been reviewed, patient examined, no change in status, stable for surgery.  I have reviewed the patient's chart and labs.  Questions were answered to the patient's satisfaction.     Sharmon Cheramie D

## 2017-03-22 NOTE — Consult Note (Signed)
Reason for Consult: Hematochezia, lower abdominal pain, and colitis Referring Physician: [facility]  [place] [place] HPI: This is a 67 year old female with a PMH of CAD, Polycythemia Vera, s/p carotid endarterectomy - bilaterally, TIA, HTN, and COPD admitted for a sudden onset of of abdominal pain followed by hematochezia.  The pain woke her up at 3 AM and the symptoms continued to persist, which brought her to the ER.  Her WBC was elevated at 14.8 and a CT scan showed a colitis in the descending and sigmoid colon.  Prior to this time she denied any issues with abdominal pain or hematochezia.    Past Medical History:  Diagnosis Date  . Anxiety   . Arthritis   . CAD (coronary artery disease)   . COPD (chronic obstructive pulmonary disease) ([place])   . Depression   . Dyspnea   . GERD (gastroesophageal reflux disease)   . Hepatitis C   . Hypertension   . Peripheral vascular disease ([place])   . Polycythemia 2009  . Seizures ([place]) 1970   x1- pt. reports that there was never any causative agent      Past Surgical History:  Procedure Laterality Date  . 2 stints  Aug 26,2010   Dr. Tawnya Crook  . ABDOMINAL SURGERY  1970's   for excessive bleeding from loss of pregnancy, required blood transfusion post procedure   . cartoid endartherectomy  2007  . CORONARY ANGIOPLASTY WITH STENT PLACEMENT  10/09/2008  . ENDARTERECTOMY Left 07/24/2016   Procedure: ENDARTERECTOMY CAROTID;  Surgeon: [name], MD;  Location: [facility];  Service: Vascular;  Laterality: Left;  . LYMPH NODE DISSECTION Left 1990's   benign   . TUBAL LIGATION      Family History  Problem Relation Age of Onset  . Heart disease Mother   . Heart disease Father     Social History:  reports that she has quit smoking. Her smoking use included cigarettes. She started smoking about 49 years ago. She has a 12.00 pack-year smoking history. she has never used smokeless tobacco. She reports that she drinks alcohol. She reports that she  does not use drugs.  Allergies:  Allergies  Allergen Reactions  . Diphenhydramine Hcl Hives  . Bactrim [Sulfamethoxazole-Trimethoprim] Nausea And Vomiting    Medications:  Scheduled: . escitalopram  20 mg Oral Daily  . hydrochlorothiazide  12.5 mg Oral Daily  . lactobacillus acidophilus  2 tablet Oral TID  . metoprolol succinate  100 mg Oral Daily  . nicotine  21 mg Transdermal Daily  . pantoprazole  40 mg Oral Daily   Continuous: . ciprofloxacin Stopped (03/22/17 0603)  . dextrose 5% lactated ringers 50 mL/hr at 03/21/17 1837  . metronidazole Stopped (03/22/17 1041)    Results for orders placed or performed during the hospital encounter of 03/20/17 (from the past 24 hour(s))  Basic metabolic panel     Status: Abnormal   Collection Time: 03/22/17  5:26 AM  Result Value Ref Range   Sodium 138 135 - 145 mmol/L   Potassium 4.2 3.5 - 5.1 mmol/L   Chloride 103 101 - 111 mmol/L   CO2 29 22 - 32 mmol/L   Glucose, Bld 136 (H) 65 - 99 mg/dL   BUN <5 (L) 6 - 20 mg/dL   Creatinine, Ser 0.64 0.44 - 1.00 mg/dL   Calcium 8.6 (L) 8.9 - 10.3 mg/dL   GFR calc non Af Amer >60 >60 mL/min   GFR calc Af Amer >60 >60 mL/min  Anion gap 6 5 - 15  CBC with Differential/Platelet     Status: None   Collection Time: 03/22/17  5:26 AM  Result Value Ref Range   WBC 9.1 4.0 - 10.5 K/uL   RBC 3.94 3.87 - 5.11 MIL/uL   Hemoglobin 12.0 12.0 - 15.0 g/dL   HCT 36.3 36.0 - 46.0 %   MCV 92.1 78.0 - 100.0 fL   MCH 30.5 26.0 - 34.0 pg   MCHC 33.1 30.0 - 36.0 g/dL   RDW 14.4 11.5 - 15.5 %   Platelets 170 150 - 400 K/uL   Neutrophils Relative % 72 %   Neutro Abs 6.5 1.7 - 7.7 K/uL   Lymphocytes Relative 20 %   Lymphs Abs 1.9 0.7 - 4.0 K/uL   Monocytes Relative 7 %   Monocytes Absolute 0.7 0.1 - 1.0 K/uL   Eosinophils Relative 1 %   Eosinophils Absolute 0.1 0.0 - 0.7 K/uL   Basophils Relative 0 %   Basophils Absolute 0.0 0.0 - 0.1 K/uL     Ct Abdomen Pelvis W Contrast  Result Date:  03/21/2017 CLINICAL DATA:  Nausea and vomiting, bright red diarrhea for 21 hours. History of hepatitis C and rectus muscle hematoma. Diverticulitis suspected. EXAM: CT ABDOMEN AND PELVIS WITH CONTRAST TECHNIQUE: Multidetector CT imaging of the abdomen and pelvis was performed using the standard protocol following bolus administration of intravenous contrast. CONTRAST:  100 cc Isovue 300 COMPARISON:  CT dated 01/09/2017. FINDINGS: Lower chest: No acute abnormality. Hepatobiliary: No focal liver abnormality is seen. No gallstones, gallbladder wall thickening, or biliary dilatation. Pancreas: Unremarkable. No pancreatic ductal dilatation or surrounding inflammatory changes. Spleen: Normal in size without focal abnormality. Adrenals/Urinary Tract: Adrenal glands appear normal. Kidneys appear normal without mass, stone or hydronephrosis. No obstructing ureteral calculi. Bladder appears normal. Stomach/Bowel: Thickening of the walls of the upper sigmoid colon and lower descending colon. The extent of the involvement suggests colitis of infectious or inflammatory nature. There is scattered diverticulosis within the sigmoid colon but no focal inflammatory changes to suggest acute diverticulitis. No dilated large or small bowel loops.  Stomach is unremarkable. Vascular/Lymphatic: Aortic atherosclerosis. No enlarged abdominal or pelvic lymph nodes. Reproductive: Uterus and bilateral adnexa are unremarkable. Other: Pericolonic fluid stranding at the levels of the colonic wall thickening. No abscess collection identified. No free intraperitoneal air. Musculoskeletal: Mild degenerative change within the lower lumbar spine. No acute or suspicious osseous finding. IMPRESSION: 1. Pronounced thickening of the walls of the upper sigmoid colon and lower descending colon. The fairly long extent of involvement suggests colitis of infectious or inflammatory nature. 2. Associated pericolonic inflammation/fluid stranding. No abscess  collection seen. No free intraperitoneal air. 3. Colonic diverticulosis without evidence of acute diverticulitis. 4. Aortic atherosclerosis. Electronically Signed   By: Franki Cabot M.D.   On: 03/21/2017 00:16    ROS:  As stated above in the HPI otherwise negative.  Blood pressure (!) 214/72, pulse 63, temperature 98.3 F (36.8 C), temperature source Oral, resp. rate 14, height 5\' 1"  (1.549 m), weight 51.4 kg (113 lb 5.1 oz), SpO2 96 %.    PE: Gen: NAD, Alert and Oriented HEENT:  [name]/AT, EOMI Neck: Supple, no LAD Lungs: CTA Bilaterally CV: RRR without M/G/R ABM: Soft, lower abdominal pain, +BS Ext: No C/C/E  Assessment/Plan: 1) Colitis. 2) Leukocytosis. 3) ABM pain.   The current findings may be consistent with an ischemic colitis.  Her clinical presentation and CT scan findings suggest this issue, however, an infectious etiology  may be the culprit.  There was no reported infectious contacts.  Plan: 1) FFS with biopsies. 2) Okay to continue with antibiotics.  Rayni Nemitz D 03/22/2017, 3:07 PM

## 2017-03-22 NOTE — Op Note (Signed)
[facility] Patient Name: [name] Procedure Date : 03/22/2017 MRN: [id number] Attending MD: Carol Ada , MD Date of Birth: 1950/05/27 CSN: [id number] Age: 67 Admit Type: Inpatient Procedure:                Flexible Sigmoidoscopy Indications:              Hematochezia, Abnormal CT of the GI tract Providers:                Carol Ada, MD, Cleda Daub, RN, Elspeth Cho                            Tech., Technician Referring MD:              Medicines:                Fentanyl 50 micrograms IV, Midazolam 4 mg IV Complications:            No immediate complications. Estimated Blood Loss:     Estimated blood loss was minimal. Procedure:                Pre-Anesthesia Assessment:                           - Prior to the procedure, a History and Physical                            was performed, and patient medications and                            allergies were reviewed. The patient's tolerance of                            previous anesthesia was also reviewed. The risks                            and benefits of the procedure and the sedation                            options and risks were discussed with the patient.                            All questions were answered, and informed consent                            was obtained. Prior Anticoagulants: The patient has                            taken no previous anticoagulant or antiplatelet                            agents. ASA Grade Assessment: III - A patient with                            severe systemic disease. After reviewing the risks  and benefits, the patient was deemed in                            satisfactory condition to undergo the procedure.                           - Sedation was administered by an endoscopy nurse.                            The sedation level attained was moderate.                           After obtaining informed consent, the scope was                        passed under direct vision. The [id number] ([id number])                            scope was introduced through the anus and advanced                            to the the sigmoid colon. The flexible                            sigmoidoscopy was accomplished without difficulty.                            The patient tolerated the procedure fairly well.                            The quality of the bowel preparation was good. Scope In: Scope Out: Findings:      Diffuse severe inflammation characterized by congestion (edema),       erythema and shallow ulcerations was found in the sigmoid colon.       Biopsies were taken with a cold forceps for histology.      Scattered small and large-mouthed diverticula were found in the sigmoid       colon.      The current findings are high suspicious for an ischemic colitis, which       would be consistent with the distribution noted on the CT scan and       clinical presentation. Impression:               - Diffuse severe inflammation was found in the                            sigmoid colon secondary to left-sided colitis.                            Biopsied.                           - Diverticulosis in the sigmoid colon. Recommendation:           - Return patient to hospital ward for ongoing care.                           -  Await pathology results. Procedure Code(s):        --- Professional ---                           [phone], Sigmoidoscopy, flexible; with biopsy, single                            or multiple Diagnosis Code(s):        --- Professional ---                           K51.50, Left sided colitis without complications                           K92.1, Melena (includes Hematochezia)                           K57.30, Diverticulosis of large intestine without                            perforation or abscess without bleeding                           R93.3, Abnormal findings on diagnostic imaging of                             other parts of digestive tract CPT copyright 2016 [employer]. All rights reserved. The codes documented in this report are preliminary and upon coder review may  be revised to meet current compliance requirements. Carol Ada, MD Carol Ada, MD 03/22/2017 3:59:54 PM This report has been signed electronically. Number of Addenda: 0

## 2017-03-22 NOTE — Progress Notes (Signed)
Patient did not understand the procedure so did not feel comfortable signing the consent form. She states that no doctor has discussed the procedure with her yet.

## 2017-03-22 NOTE — Progress Notes (Signed)
PROGRESS NOTE    MOZEL BURDETT  OXB:[id number] DOB: 06-16-1950 DOA: 03/20/2017 PCP: Wenda Low, MD    Brief Narrative:  67 year old female who presented with rectal bleeding. She does have significant past medical history hypertension, coronary disease, polycythemia, COPD, GERD. She had acute rectal bleeding, associated with nausea, vomiting and diarrhea, pain was colicky nature. On the initial physical examination blood pressure 146/67, heart rate 67, respiratory 20, temp 90.3, saturation 98%. Dry mucous membranes, lungs clear to auscultation bilaterally, heart S1-S2 present rhythmic, abdomen was soft, tender to palpation in all quadrants, no lower extremity edema. Sodium 132, potassium 4.1, chloride 92, bicarbonate 29, glucose 124, BUN 11, creatinine 0.77, white count 14.8, hemoglobin 15.9, hematocrit 46.7, platelets 246. Urinalysis negative for infection. CT of the abdomen show pronounced thickening of the walls of the upper sigmoid colon and lower descending colon, suggesting colitis. EKG normal sinus rhythm, normal axis, normal intervals.  Patient admitted to the hospital working diagnosis of lower GI bleed due to colitis.    Assessment & Plan:   Principal Problem:   Colitis with rectal bleeding Active Problems:   ANXIETY DEPRESSION   Essential hypertension   Coronary artery disease involving native coronary artery of native heart with angina pectoris (HCC)   GERD (gastroesophageal reflux disease)   Polycythemia vera ([place])   1. Lower GI bleed due to colitis. Persistent pain and bleeding, has been on antibiotics therapy with ciprofloxacin and metronidazole. Old records with hx of polyps. Will consult GI for possible endoscopic procedure. Patient has been changed to NPO, will add gently IV fluids, will continue antiacid therapy and as needed IV antiemetics. Hb continue to be stable at 12 from 13. Responding well to bentyl and morphine for pain control.   2. Polycythemia vera.  Hb 12, will continue cell count monitoring in the hospital.   3. Hypertension.  On metoprolol and hctz, for blood pressure control.   4. Coronary artery disease. Holding on asa, due to GI bleeding.   5. Anxiety and depression. Tolerating well alprazom, escitalopram, no confusion or agitation.   6. Tobacco abuse. Continue smoking cessation, nicotine patch.   DVT prophylaxis: scd  Code Status:  full Family Communication:  No family at the bedside Disposition Plan:  home   Consultants:     Procedures:     Antimicrobials:      Subjective: Patient with persistent bleeding, associated with abdomina pain, no nausea or vomiting, no fever or chills.   Objective: Vitals:   03/21/17 0909 03/21/17 1538 03/21/17 2307 03/22/17 0502  BP: (!) 146/78 (!) 149/49 (!) 166/57 (!) 131/44  Pulse: 69 60 67 63  Resp:  16 16 20   Temp:  98.2 F (36.8 C) 98.7 F (37.1 C) 98.8 F (37.1 C)  TempSrc:  Oral  Oral  SpO2:  96% 97% 96%  Weight:      Height:        Intake/Output Summary (Last 24 hours) at 03/22/2017 1401 Last data filed at 03/22/2017 1312 Gross per 24 hour  Intake 2485 ml  Output 650 ml  Net 1835 ml   Filed Weights   03/20/17 1844 03/21/17 0344  Weight: 51.3 kg (113 lb) 51.4 kg (113 lb 5.1 oz)    Examination:   General: Not in pain or dyspnea, deconditioned Neurology: Awake and alert, non focal  E ENT: mild pallor, no icterus, oral mucosa moist Cardiovascular: No JVD. S1-S2 present, rhythmic, no gallops, rubs, or murmurs. No lower extremity edema. Pulmonary: vesicular breath sounds bilaterally,  adequate air movement, no wheezing, rhonchi or rales. Gastrointestinal. Abdomen mild distention,  no organomegaly,, no rebound or guarding, tender to palpation at the lower quadrants, no rebound.  Skin. No rashes Musculoskeletal: no joint deformities     Data Reviewed: I have personally reviewed following labs and imaging studies  CBC: Recent Labs  Lab  03/20/17 1852 03/21/17 0029 03/21/17 0522 03/22/17 0526  WBC 14.8* 11.9* 11.3* 9.1  NEUTROABS 11.8*  --   --  6.5  HGB 15.9* 13.7 13.3 12.0  HCT 46.7* 40.2 40.1 36.3  MCV 87.6 88.0 89.5 92.1  PLT 246 192 191 712   Basic Metabolic Panel: Recent Labs  Lab 03/20/17 1852 03/21/17 0522 03/22/17 0526  NA 132* 135 138  K 4.1 3.1* 4.2  CL 92* 102 103  CO2 29 24 29   GLUCOSE 124* 118* 136*  BUN 11 5* <5*  CREATININE 0.77 0.63 0.64  CALCIUM 9.3 8.3* 8.6*   GFR: Estimated Creatinine Clearance: 52.2 mL/min (by C-G formula based on SCr of 0.64 mg/dL). Liver Function Tests: Recent Labs  Lab 03/20/17 1856  AST 33  ALT 14  ALKPHOS 71  BILITOT 1.0  PROT 7.9  ALBUMIN 4.5   Recent Labs  Lab 03/20/17 1856  LIPASE 29   No results for input(s): AMMONIA in the last 168 hours. Coagulation Profile: Recent Labs  Lab 03/20/17 1856  INR 0.97   Cardiac Enzymes: No results for input(s): CKTOTAL, CKMB, CKMBINDEX, TROPONINI in the last 168 hours. BNP (last 3 results) No results for input(s): PROBNP in the last 8760 hours. HbA1C: No results for input(s): HGBA1C in the last 72 hours. CBG: No results for input(s): GLUCAP in the last 168 hours. Lipid Profile: No results for input(s): CHOL, HDL, LDLCALC, TRIG, CHOLHDL, LDLDIRECT in the last 72 hours. Thyroid Function Tests: No results for input(s): TSH, T4TOTAL, FREET4, T3FREE, THYROIDAB in the last 72 hours. Anemia Panel: No results for input(s): VITAMINB12, FOLATE, FERRITIN, TIBC, IRON, RETICCTPCT in the last 72 hours.    Radiology Studies: I have reviewed all of the imaging during this hospital visit personally     Scheduled Meds: . escitalopram  20 mg Oral Daily  . hydrochlorothiazide  12.5 mg Oral Daily  . lactobacillus acidophilus  2 tablet Oral TID  . metoprolol succinate  100 mg Oral Daily  . nicotine  21 mg Transdermal Daily  . pantoprazole  40 mg Oral Daily   Continuous Infusions: . ciprofloxacin Stopped  (03/22/17 0603)  . dextrose 5% lactated ringers 50 mL/hr at 03/21/17 1837  . metronidazole Stopped (03/22/17 1041)     LOS: 1 day        Mauricio Gerome Apley, MD [facility] Pager [phone]

## 2017-03-23 ENCOUNTER — Encounter (HOSPITAL_COMMUNITY): Payer: Self-pay | Admitting: Gastroenterology

## 2017-03-23 DIAGNOSIS — K559 Vascular disorder of intestine, unspecified: Secondary | ICD-10-CM

## 2017-03-23 DIAGNOSIS — Z720 Tobacco use: Secondary | ICD-10-CM

## 2017-03-23 LAB — CBC WITH DIFFERENTIAL/PLATELET
Basophils Absolute: 0 10*3/uL (ref 0.0–0.1)
Basophils Relative: 0 %
EOS ABS: 0.1 10*3/uL (ref 0.0–0.7)
EOS PCT: 1 %
HCT: 38.3 % (ref 36.0–46.0)
Hemoglobin: 12.1 g/dL (ref 12.0–15.0)
Lymphocytes Relative: 26 %
Lymphs Abs: 1.8 10*3/uL (ref 0.7–4.0)
MCH: 29 pg (ref 26.0–34.0)
MCHC: 31.6 g/dL (ref 30.0–36.0)
MCV: 91.8 fL (ref 78.0–100.0)
MONO ABS: 0.6 10*3/uL (ref 0.1–1.0)
MONOS PCT: 8 %
Neutro Abs: 4.6 10*3/uL (ref 1.7–7.7)
Neutrophils Relative %: 65 %
PLATELETS: 164 10*3/uL (ref 150–400)
RBC: 4.17 MIL/uL (ref 3.87–5.11)
RDW: 14.1 % (ref 11.5–15.5)
WBC: 7.1 10*3/uL (ref 4.0–10.5)

## 2017-03-23 LAB — BASIC METABOLIC PANEL
Anion gap: 8 (ref 5–15)
BUN: <5 mg/dL — ABNORMAL LOW (ref 6–20)
CALCIUM: 8.7 mg/dL — AB (ref 8.9–10.3)
CO2: 29 mmol/L (ref 22–32)
CREATININE: 0.79 mg/dL (ref 0.44–1.00)
Chloride: 102 mmol/L (ref 101–111)
Glucose, Bld: 107 mg/dL — ABNORMAL HIGH (ref 65–99)
Potassium: 4 mmol/L (ref 3.5–5.1)
SODIUM: 139 mmol/L (ref 135–145)

## 2017-03-23 MED ORDER — HYDRALAZINE HCL 20 MG/ML IJ SOLN
10.0000 mg | INTRAMUSCULAR | Status: DC | PRN
  Administered 2017-03-23 – 2017-03-24 (×2): 10 mg via INTRAVENOUS
  Filled 2017-03-23 (×2): qty 1

## 2017-03-23 NOTE — Progress Notes (Signed)
MD notified of blood pressure again of 209/70. Asked for an order of blood pressure medicine. Still have not gotten a response. This is the fourth time paging.

## 2017-03-23 NOTE — Consult Note (Signed)
[facility] CM Primary Care Navigator  03/23/2017  [name] 1950-03-23 [id number]   Met with patient at the bedsideto identify possible discharge needs. Patientreports having "lower abdominal pain, nausea/ vomiting, diarrhea and blood in the rectum"thathad led to this admission.  Patientendorses Dr. Wenda Low with [facility] as her primary care provider.   [facility] [address] [facility].   Patient reportsmanagingher ownmedications at home with use of "pill box" system filled every week.  Patient statesthatshe was driving prior to admission. Her son Leroy Sea) will be able to providetransportation to her doctors'appointments after discharge.  Patient is her mother's caregiver at home. Her oldest sister Jolayne Haines from [place]) came in to provide care for their mother while patient is admitted. Patient verbalized that her sister may be able to assist her for few days as she recovers. According to patient, she also has a niece who can assist her for few hours a day if needed and a granddaughter who can help her after school when needed.   Anticipated discharge is home according to patient.  Patientvoiced understanding to call primary care provider's officewhen shereturnshome,for a post discharge follow-up within 1-2 weeks or sooner if needs arise.Patient letter (with PCP's contact number) was provided as herreminder.   Discussed with patient regarding [facility] CM services available for health management at home, but she verbalized decline ofservices that wasoffered to her at different times, except forEMMI calls to follow-up withrecovery. She mentioned being able to manage herself at home so far, with breathing treatments andclose follow-up with providers when needed.  [facility] after [facility] as opted by  patient.  Encouraged patientto seekreferral from primary care provider to [facility] care management if deemednecessaryand appropriate for further services in the near future.Patient reports being aware of [facility] care management services since her mother had been previously seen by [place] coordinator (C.Spinks) at home.  [facility] care management information was provided for future needs that she may have.   For questions, please contact:  Dannielle Huh, BSN, RN- [facility] Primary Care Navigator  Telephone: [phone] [place]

## 2017-03-23 NOTE — Progress Notes (Signed)
MD notified of patient having a heart rate of 57. I asked if I should hold metoprolol. MD said to hold it.

## 2017-03-23 NOTE — Progress Notes (Signed)
Patient was unable to tolerate the second enema.

## 2017-03-23 NOTE — Progress Notes (Signed)
MD notified of blood pressure of 179/71.

## 2017-03-23 NOTE — Progress Notes (Addendum)
PROGRESS NOTE    [name]  SEG:[id number] DOB: [birth year] DOA: 03/20/2017 PCP: Wenda Low, MD    Brief Narrative:  67 year old female who presented with rectal bleeding. She does have significant past medical history hypertension, coronary disease, polycythemia, COPD, GERD. She had acute rectal bleeding, associated with nausea, vomiting and diarrhea, pain was colicky nature. On the initial physical examination blood pressure 146/67, heart rate 67, respiratory 20, temp 90.3, saturation 98%. Dry mucous membranes, lungs clear to auscultation bilaterally, heart S1-S2 present rhythmic, abdomen was soft, tender to palpation in all quadrants, no lower extremity edema. Sodium 132, potassium 4.1, chloride 92, bicarbonate 29, glucose 124, BUN 11, creatinine 0.77, white count 14.8, hemoglobin 15.9, hematocrit 46.7, platelets 246. Urinalysis negative for infection. CT of the abdomen show pronounced thickening of the walls of the upper sigmoid colon and lower descending colon, suggesting colitis. EKG normal sinus rhythm, normal axis, normal intervals.  Patient admitted to the hospital working diagnosis of lower GI bleed due to colitis.   Assessment & Plan:   Principal Problem:   Colitis with rectal bleeding Active Problems:   ANXIETY DEPRESSION   Essential hypertension   Coronary artery disease involving native coronary artery of native heart with angina pectoris (HCC)   GERD (gastroesophageal reflux disease)   Polycythemia vera ([place])  1. Lower GI bleed due to colitis. Patient sp flex sigmoidoscopy. Noted severe ischemic colitis, follow on pathology report. Will continue supportive medical therapy with IV fluids, IV analgesics, as needed antiemetics and antispasmodics. Hb and hct continue to be stable, will follow cell count in am. Will change diet to soft. Out of bed as tolerated. Continue IV antibiotic therapy with ciprofloxacin and metronidazole. Discontinue telemetry, and will decrease  IV fluids to 50 ml per hour. Out of bed, physical therapy and ambulation.    2. Polycythemia vera. Stable, follow up as outpatient.   3. Hypertension.  Will continue metoprolol and hctz, for blood pressure control.   4. Coronary artery disease.No clopidogrel for now, until resolved bleeding.   5. Anxiety and depression. Continue alprazom, escitalopram,with good toleration.  6. Tobacco abuse. Aggressive smoking cessation, patient agrees to continue nicotine patch.  DVT prophylaxis:scd Code Status:full Family Communication:I spoke with patient's son at the bedside and all questions were addressed. Disposition Plan:home   Consultants:    Procedures:    Subjective: Patient continue to have pain with meals, (regular meal), persistent bleeding, no nausea or vomiting, no fever or chills.  Objective: Vitals:   03/23/17 0007 03/23/17 0125 03/23/17 0637 03/23/17 0857  BP: (!) 173/64 131/61 (!) 154/64 (!) 149/53  Pulse: (!) 57  63 (!) 57  Resp:      Temp:   98.4 F (36.9 C)   TempSrc:   Oral   SpO2:   98%   Weight:      Height:        Intake/Output Summary (Last 24 hours) at 03/23/2017 1119 Last data filed at 03/23/2017 1000 Gross per 24 hour  Intake 905.83 ml  Output 450 ml  Net 455.83 ml   Filed Weights   03/20/17 1844 03/21/17 0344  Weight: 51.3 kg (113 lb) 51.4 kg (113 lb 5.1 oz)    Examination:   General: Not in pain or dyspnea, deconditioned Neurology: Awake and alert, non focal  E ENT: mild pallor, no icterus, oral mucosa moist Cardiovascular: No JVD. S1-S2 present, rhythmic, no gallops, rubs, or murmurs. No lower extremity edema. Pulmonary: vesicular breath sounds bilaterally, adequate air movement, no wheezing,  rhonchi or rales. Mild decreased breath sounds at bases.  Gastrointestinal. Abdomen flat, no organomegaly, tender to deep palpation, no rebound or guarding. Hyperactive bowel sounds.  Skin. No rashes Musculoskeletal: no joint  deformities     Data Reviewed: I have personally reviewed following labs and imaging studies  CBC: Recent Labs  Lab 03/20/17 1852 03/21/17 0029 03/21/17 0522 03/22/17 0526 03/23/17 0549  WBC 14.8* 11.9* 11.3* 9.1 7.1  NEUTROABS 11.8*  --   --  6.5 4.6  HGB 15.9* 13.7 13.3 12.0 12.1  HCT 46.7* 40.2 40.1 36.3 38.3  MCV 87.6 88.0 89.5 92.1 91.8  PLT 246 192 191 170 076   Basic Metabolic Panel: Recent Labs  Lab 03/20/17 1852 03/21/17 0522 03/22/17 0526 03/23/17 0549  NA 132* 135 138 139  K 4.1 3.1* 4.2 4.0  CL 92* 102 103 102  CO2 29 24 29 29   GLUCOSE 124* 118* 136* 107*  BUN 11 5* <5* <5*  CREATININE 0.77 0.63 0.64 0.79  CALCIUM 9.3 8.3* 8.6* 8.7*   GFR: Estimated Creatinine Clearance: 52.2 mL/min (by C-G formula based on SCr of 0.79 mg/dL). Liver Function Tests: Recent Labs  Lab 03/20/17 1856  AST 33  ALT 14  ALKPHOS 71  BILITOT 1.0  PROT 7.9  ALBUMIN 4.5   Recent Labs  Lab 03/20/17 1856  LIPASE 29   No results for input(s): AMMONIA in the last 168 hours. Coagulation Profile: Recent Labs  Lab 03/20/17 1856  INR 0.97   Cardiac Enzymes: No results for input(s): CKTOTAL, CKMB, CKMBINDEX, TROPONINI in the last 168 hours. BNP (last 3 results) No results for input(s): PROBNP in the last 8760 hours. HbA1C: No results for input(s): HGBA1C in the last 72 hours. CBG: No results for input(s): GLUCAP in the last 168 hours. Lipid Profile: No results for input(s): CHOL, HDL, LDLCALC, TRIG, CHOLHDL, LDLDIRECT in the last 72 hours. Thyroid Function Tests: No results for input(s): TSH, T4TOTAL, FREET4, T3FREE, THYROIDAB in the last 72 hours. Anemia Panel: No results for input(s): VITAMINB12, FOLATE, FERRITIN, TIBC, IRON, RETICCTPCT in the last 72 hours.    Radiology Studies: I have reviewed all of the imaging during this hospital visit personally     Scheduled Meds: . escitalopram  20 mg Oral Daily  . hydrochlorothiazide  12.5 mg Oral Daily  .  lactobacillus acidophilus  2 tablet Oral TID  . metoprolol succinate  100 mg Oral Daily  . nicotine  21 mg Transdermal Daily  . pantoprazole  40 mg Oral Daily   Continuous Infusions: . ciprofloxacin Stopped (03/23/17 1005)  . dextrose 5% lactated ringers 1 mL (03/22/17 1736)  . metronidazole Stopped (03/23/17 8088)     LOS: 2 days        Alveena Taira Gerome Apley, MD [facility] Pager [phone]

## 2017-03-23 NOTE — Progress Notes (Signed)
I asked the MD if I could give the metoprolol that I held this morning.

## 2017-03-23 NOTE — Progress Notes (Signed)
MD notified of patient's blood pressure of 209/70. Asked for an order again to bring down blood pressure.

## 2017-03-23 NOTE — Progress Notes (Signed)
I administered the hydralazine to the patient and in report told the oncoming RN to check her blood pressure.

## 2017-03-23 NOTE — Progress Notes (Signed)
Subjective: Bleeding is subsiding.  ABM pain is improving.  Objective: Vital signs in last 24 hours: Temp:  [98.3 F (36.8 C)-99 F (37.2 C)] 98.4 F (36.9 C) (02/01 0637) Pulse Rate:  [54-78] 63 (02/01 0637) Resp:  [11-25] 18 (01/31 2114) BP: (131-214)/(39-77) 154/64 (02/01 0637) SpO2:  [95 %-100 %] 98 % (02/01 0637) Last BM Date: 03/22/17  Intake/Output from previous day: 01/31 0701 - 02/01 0700 In: 905.8 [P.O.:240; I.V.:565.8; IV Piggyback:100] Out: 450 [Urine:450] Intake/Output this shift: No intake/output data recorded.  General appearance: alert and no distress GI: soft, non-tender; bowel sounds normal; no masses,  no organomegaly  Lab Results: Recent Labs    03/21/17 0522 03/22/17 0526 03/23/17 0549  WBC 11.3* 9.1 7.1  HGB 13.3 12.0 12.1  HCT 40.1 36.3 38.3  PLT 191 170 164   BMET Recent Labs    03/21/17 0522 03/22/17 0526 03/23/17 0549  NA 135 138 139  K 3.1* 4.2 4.0  CL 102 103 102  CO2 24 29 29   GLUCOSE 118* 136* 107*  BUN 5* <5* <5*  CREATININE 0.63 0.64 0.79  CALCIUM 8.3* 8.6* 8.7*   LFT Recent Labs    03/20/17 1856  PROT 7.9  ALBUMIN 4.5  AST 33  ALT 14  ALKPHOS 71  BILITOT 1.0  BILIDIR <0.1*  IBILI NOT CALCULATED   PT/INR Recent Labs    03/20/17 1856  LABPROT 12.8  INR 0.97   Hepatitis Panel No results for input(s): HEPBSAG, HCVAB, HEPAIGM, HEPBIGM in the last 72 hours. C-Diff No results for input(s): CDIFFTOX in the last 72 hours. Fecal Lactopherrin No results for input(s): FECLLACTOFRN in the last 72 hours.  Studies/Results: No results found.  Medications:  Scheduled: . escitalopram  20 mg Oral Daily  . hydrochlorothiazide  12.5 mg Oral Daily  . lactobacillus acidophilus  2 tablet Oral TID  . metoprolol succinate  100 mg Oral Daily  . nicotine  21 mg Transdermal Daily  . pantoprazole  40 mg Oral Daily   Continuous: . ciprofloxacin Stopped (03/22/17 2031)  . dextrose 5% lactated ringers 1 mL (03/22/17 1736)  .  metronidazole Stopped (03/23/17 0003)    Assessment/Plan: 1) Colitis - suspect ischemic in origin. 2) Hematochezia.   She is improving.  Still with some pain in the suprapubic region.  The bleeding is subsiding.    Plan: 1) Advance diet. 2) Okay to D/C home. 3) She can remain on Cipro/Flagyl x 10 days total. 4) Follow up in the office in 2 weeks.  LOS: 2 days   Salvador Coupe D 03/23/2017, 8:17 AM

## 2017-03-23 NOTE — Progress Notes (Signed)
Patient received up at bedside making her bed, patient surprised to see PT and reports that she does not feel in need of skilled PT services as she has no concerns with her mobility, gait, or balance. Observation of patient's mobility within room reveals [facility] mobility and movement patterns.   At this time patient does not appear to be in need of skilled PT services as she is generally at her baseline level of function, PT signing off for now. Thank you for the referral.   Deniece Ree PT, DPT, CBIS  Supplemental Physical Therapist [facility]   Pager [phone]

## 2017-03-24 LAB — HEMOGLOBIN AND HEMATOCRIT, BLOOD
HCT: 37.3 % (ref 36.0–46.0)
HEMOGLOBIN: 12.3 g/dL (ref 12.0–15.0)

## 2017-03-24 MED ORDER — DICYCLOMINE HCL 10 MG PO CAPS: 10.0000 mg | ORAL_CAPSULE | Freq: Three times a day (TID) | ORAL | 0 refills | Status: DC | PRN

## 2017-03-24 MED ORDER — METRONIDAZOLE 500 MG PO TABS: 500.0000 mg | ORAL_TABLET | Freq: Three times a day (TID) | ORAL | Status: DC

## 2017-03-24 MED ORDER — CIPROFLOXACIN HCL 500 MG PO TABS: 500.0000 mg | ORAL_TABLET | Freq: Two times a day (BID) | ORAL | Status: DC

## 2017-03-24 MED ORDER — NICOTINE 21 MG/24HR TD PT24: 21.0000 mg | MEDICATED_PATCH | Freq: Every day | TRANSDERMAL | 0 refills | Status: DC

## 2017-03-24 MED ORDER — METRONIDAZOLE 500 MG PO TABS: 500.0000 mg | ORAL_TABLET | Freq: Three times a day (TID) | ORAL | 0 refills | Status: AC

## 2017-03-24 MED ORDER — CIPROFLOXACIN HCL 500 MG PO TABS: 500.0000 mg | ORAL_TABLET | Freq: Two times a day (BID) | ORAL | 0 refills | Status: AC

## 2017-03-24 NOTE — Discharge Summary (Signed)
Physician Discharge Summary  [name] TIR:[id number] DOB: 1950/10/08 DOA: 03/20/2017  PCP: Wenda Low, MD  Admit date: 03/20/2017 Discharge date: 03/24/2017  Admitted From: Home Disposition:  Home  Recommendations for Outpatient Follow-up and recent medication changes:  1. Follow up with PCP in 1-weeks 2. Pending pathology result from sigmoid biopsy. 3. Patient has been placed on 12 days of ciprofloxacin and metronidazole to complete total 14 days. 4. Placed on Bentyl as needed for abdominal pain. 5. Started on Nicotine patch for smoking cessation.   Home Health: no  Equipment/Devices: no   Discharge Condition: stable CODE STATUS: Full  Diet recommendation:  Heart healthy   Brief/Interim Summary: 67 year old female who presented with rectal bleeding. She does have significant past medical history hypertension, coronary disease, polycythemia Vera, COPD, and GERD. She had acute rectal bleeding, associated with nausea, vomiting and diarrhea, pain was colicky in nature. On the initial physical examination blood pressure 146/67, heart rate 67, respiratory rate 20, temp 98.3, oxygen saturation 98%. Dry mucous membranes, lungs clear to auscultation bilaterally, heart S1-S2 present rhythmic, abdomen was soft, tender to palpation in all quadrants, no lower extremity edema.Sodium 132, potassium 4.1, chloride 92, bicarbonate 29, glucose 124, BUN 11, creatinine 0.77, white count 14.8, hemoglobin 15.9, hematocrit 46.7, platelets 246. Urinalysis negative for infection.CT of the abdomen show pronounced thickening of the walls of the upper sigmoid colon and lower descending colon, suggesting colitis.EKG normal sinus rhythm, normal axis, normal intervals.  Patient admitted to the hospital working diagnosis of lower GI bleed due to acute colitis.  1. Acute ischemic colitis. Patient was admitted to the medical, she was placed normal telemetry monitor. She was placed on supportive medical  therapy with IV fluids, IV antibiotics with ciprofloxacin and metronidazole, IV analgesics, IV antiacids and as needed antiemetics. Her hemoglobin and hematocrit remained stable, did not require blood transfusion. Discharge hemoglobin 12.3, hematocrit 37.3. She had persistent bleeding, and a diagnostic flexible sigmoidoscopy was performed, finding diffuse severe inflammation in the sigmoid colon with congestion, erythema and shallow ulcerations, highly suggestive of ischemic colitis. Her diet was slowly advanced with good toleration, she was strongly advised to stop smoking. Clopidogrel was held during her hospitalization. Patient will complete 14 days of antibiotic therapy.   2. Hypertension. Patient was continued on hydrochlorothiazide and metoprolol, she did have episodes of hypertension that required IV hydralazine. Systolic blood pressure at discharge 115.   3. Coronary artery and carotid artery disease. Patient remained stable, chest pain-free, continue blood pressure control, she will resume Clopidogrel at discharge. Continue statin therapy with simvastatin.   5. Tobacco abuse. Smoking cessation, patient is committed to quit, a nicotine patch will be prescribed.   6. Anxiety and depression. No confusion or agitation, patient was continued on alprazolam, and escitalopram.   Discharge Diagnoses:  Principal Problem:   Colitis with rectal bleeding Active Problems:   ANXIETY DEPRESSION   Essential hypertension   Coronary artery disease involving native coronary artery of native heart with angina pectoris (HCC)   GERD (gastroesophageal reflux disease)   Polycythemia vera (HCC)    Discharge Instructions   Allergies as of 03/24/2017      Reactions   Diphenhydramine Hcl Hives   Bactrim [sulfamethoxazole-trimethoprim] Nausea And Vomiting      Medication List    TAKE these medications   acetaminophen 500 MG tablet Commonly known as:  TYLENOL Take 500-1,000 mg by mouth every 6 (six)  hours as needed for mild pain or headache (depends on pain if takes 1-2  tablets).   albuterol 108 (90 Base) MCG/ACT inhaler Commonly known as:  PROVENTIL HFA;VENTOLIN HFA Inhale 2 puffs into the lungs every 6 (six) hours as needed for wheezing or shortness of breath.   ALPRAZolam 0.5 MG tablet Commonly known as:  XANAX Take 0.5 mg by mouth at bedtime.   Biotin 1000 MCG tablet Take 1,000 mcg by mouth daily.   ciprofloxacin 500 MG tablet Commonly known as:  CIPRO Take 1 tablet (500 mg total) by mouth 2 (two) times daily for 12 days.   clopidogrel 75 MG tablet Commonly known as:  PLAVIX TAKE 1 TABLET BY MOUTH ONCE DAILY   clotrimazole-betamethasone cream Commonly known as:  LOTRISONE use 1 application to affected area as needed for sore areas on body   dicyclomine 10 MG capsule Commonly known as:  BENTYL Take 1 capsule (10 mg total) by mouth 3 (three) times daily as needed for spasms (abdominal pain).   escitalopram 20 MG tablet Commonly known as:  LEXAPRO Take 20 mg by mouth daily.   hydrochlorothiazide 12.5 MG capsule Commonly known as:  MICROZIDE TAKE 1 CAPSULE BY MOUTH ONCE DAILY   lansoprazole 30 MG capsule Commonly known as:  PREVACID Take 30 mg by mouth daily as needed (for ulcer/acid reflex). Ulcer/ acid reflux   metoprolol succinate 100 MG 24 hr tablet Commonly known as:  TOPROL-XL TAKE 1 & 1/2 (ONE & ONE-HALF) TABLETS BY MOUTH ONCE DAILY   metroNIDAZOLE 500 MG tablet Commonly known as:  FLAGYL Take 1 tablet (500 mg total) by mouth every 8 (eight) hours for 12 days.   mupirocin ointment 2 % Commonly known as:  BACTROBAN Apply twice a day to right arm wound What changed:    how much to take  how to take this  when to take this  reasons to take this  additional instructions   nicotine 21 mg/24hr patch Commonly known as:  NICODERM CQ - dosed in mg/24 hours Place 1 patch (21 mg total) onto the skin daily. Start taking on:  03/25/2017    nitroGLYCERIN 0.4 MG SL tablet Commonly known as:  NITROSTAT Place 1 tablet (0.4 mg total) under the tongue every 5 (five) minutes as needed for chest pain.   simvastatin 10 MG tablet Commonly known as:  ZOCOR Take 10 mg by mouth every other day.   VITAMIN C PO Take 2 tablets by mouth daily.   vitamin E 400 UNIT capsule Take 400 Units by mouth daily.       Allergies  Allergen Reactions  . Diphenhydramine Hcl Hives  . Bactrim [Sulfamethoxazole-Trimethoprim] Nausea And Vomiting    Consultations:  Gastroenterology    Procedures/Studies: Ct Abdomen Pelvis W Contrast  Result Date: 03/21/2017 CLINICAL DATA:  Nausea and vomiting, bright red diarrhea for 21 hours. History of hepatitis C and rectus muscle hematoma. Diverticulitis suspected. EXAM: CT ABDOMEN AND PELVIS WITH CONTRAST TECHNIQUE: Multidetector CT imaging of the abdomen and pelvis was performed using the standard protocol following bolus administration of intravenous contrast. CONTRAST:  100 cc Isovue 300 COMPARISON:  CT dated 01/09/2017. FINDINGS: Lower chest: No acute abnormality. Hepatobiliary: No focal liver abnormality is seen. No gallstones, gallbladder wall thickening, or biliary dilatation. Pancreas: Unremarkable. No pancreatic ductal dilatation or surrounding inflammatory changes. Spleen: Normal in size without focal abnormality. Adrenals/Urinary Tract: Adrenal glands appear normal. Kidneys appear normal without mass, stone or hydronephrosis. No obstructing ureteral calculi. Bladder appears normal. Stomach/Bowel: Thickening of the walls of the upper sigmoid colon and lower descending colon. The extent  of the involvement suggests colitis of infectious or inflammatory nature. There is scattered diverticulosis within the sigmoid colon but no focal inflammatory changes to suggest acute diverticulitis. No dilated large or small bowel loops.  Stomach is unremarkable. Vascular/Lymphatic: Aortic atherosclerosis. No enlarged  abdominal or pelvic lymph nodes. Reproductive: Uterus and bilateral adnexa are unremarkable. Other: Pericolonic fluid stranding at the levels of the colonic wall thickening. No abscess collection identified. No free intraperitoneal air. Musculoskeletal: Mild degenerative change within the lower lumbar spine. No acute or suspicious osseous finding. IMPRESSION: 1. Pronounced thickening of the walls of the upper sigmoid colon and lower descending colon. The fairly long extent of involvement suggests colitis of infectious or inflammatory nature. 2. Associated pericolonic inflammation/fluid stranding. No abscess collection seen. No free intraperitoneal air. 3. Colonic diverticulosis without evidence of acute diverticulitis. 4. Aortic atherosclerosis. Electronically Signed   By: Franki Cabot M.D.   On: 03/21/2017 00:16       Subjective: Patient feeling better, no nausea or vomiting, abdominal pain with significant improvement, bleeding continue to improve. No dizziness.   Discharge Exam: Vitals:   03/24/17 0545 03/24/17 0932  BP: (!) 203/78 (!) 115/53  Pulse: 71 72  Resp: 18 16  Temp: 98.6 F (37 C)   SpO2: 99% 98%   Vitals:   03/23/17 1832 03/23/17 2209 03/24/17 0545 03/24/17 0932  BP: (!) 209/70 (!) 170/65 (!) 203/78 (!) 115/53  Pulse: 61 71 71 72  Resp:  20 18 16   Temp:  98.6 F (37 C) 98.6 F (37 C)   TempSrc:  Oral Oral   SpO2:  95% 99% 98%  Weight:      Height:        General: Not in pain or dyspnea Neurology: Awake and alert, non focal  E ENT: no pallor, no icterus, oral mucosa moist Cardiovascular: No JVD. S1-S2 present, rhythmic, no gallops, rubs, or murmurs. No lower extremity edema. Pulmonary: vesicular breath sounds bilaterally, adequate air movement, no wheezing, rhonchi or rales. Gastrointestinal. Abdomen flat, mild tender to deep palpation, no organomegaly, no rebound or guarding Skin. No rashes Musculoskeletal: no joint deformities   The results of significant  diagnostics from this hospitalization (including imaging, microbiology, ancillary and laboratory) are listed below for reference.     Microbiology: Recent Results (from the past 240 hour(s))  Urine culture     Status: None   Collection Time: 03/20/17 10:07 PM  Result Value Ref Range Status   Specimen Description URINE, RANDOM  Final   Special Requests NONE  Final   Culture   Final    NO GROWTH Performed at [facility], [address], [place], [place] [address]    Report Status 03/22/2017 FINAL  Final  Blood culture (routine x 2)     Status: None (Preliminary result)   Collection Time: 03/21/17  1:00 AM  Result Value Ref Range Status   Specimen Description   Final    BLOOD LEFT ARM Performed at [facility], [address] [place], [place] [address]    Special Requests   Final    BOTTLES DRAWN AEROBIC AND ANAEROBIC Blood Culture adequate volume Performed at [facility], [address] [place], [place] [address]    Culture   Final    NO GROWTH 3 DAYS Performed at [facility], [place] [address], [place], [place] [address]    Report Status PENDING  Incomplete  Blood culture (routine x 2)  Status: None (Preliminary result)   Collection Time: 03/21/17  1:20 AM  Result Value Ref Range Status   Specimen Description   Final    BLOOD RIGHT FOREARM Performed at [facility], [address] [place], [place] [address]    Special Requests   Final    BOTTLES DRAWN AEROBIC AND ANAEROBIC Blood Culture adequate volume Performed at [facility], [address] [place], [place] [address]    Culture   Final    NO GROWTH 3 DAYS Performed at [facility], [place] [address], [place], [place] [address]    Report Status PENDING  Incomplete     Labs: BNP (last 3 results) No results for input(s): BNP in the last 8760 hours. Basic Metabolic Panel: Recent Labs  Lab 03/20/17 1852 03/21/17 0522 03/22/17 0526 03/23/17 0549  NA  132* 135 138 139  K 4.1 3.1* 4.2 4.0  CL 92* 102 103 102  CO2 29 24 29 29   GLUCOSE 124* 118* 136* 107*  BUN 11 5* <5* <5*  CREATININE 0.77 0.63 0.64 0.79  CALCIUM 9.3 8.3* 8.6* 8.7*   Liver Function Tests: Recent Labs  Lab 03/20/17 1856  AST 33  ALT 14  ALKPHOS 71  BILITOT 1.0  PROT 7.9  ALBUMIN 4.5   Recent Labs  Lab 03/20/17 1856  LIPASE 29   No results for input(s): AMMONIA in the last 168 hours. CBC: Recent Labs  Lab 03/20/17 1852 03/21/17 0029 03/21/17 0522 03/22/17 0526 03/23/17 0549 03/24/17 0351  WBC 14.8* 11.9* 11.3* 9.1 7.1  --   NEUTROABS 11.8*  --   --  6.5 4.6  --   HGB 15.9* 13.7 13.3 12.0 12.1 12.3  HCT 46.7* 40.2 40.1 36.3 38.3 37.3  MCV 87.6 88.0 89.5 92.1 91.8  --   PLT 246 192 191 170 164  --    Cardiac Enzymes: No results for input(s): CKTOTAL, CKMB, CKMBINDEX, TROPONINI in the last 168 hours. BNP: Invalid input(s): POCBNP CBG: No results for input(s): GLUCAP in the last 168 hours. D-Dimer No results for input(s): DDIMER in the last 72 hours. Hgb A1c No results for input(s): HGBA1C in the last 72 hours. Lipid Profile No results for input(s): CHOL, HDL, LDLCALC, TRIG, CHOLHDL, LDLDIRECT in the last 72 hours. Thyroid function studies No results for input(s): TSH, T4TOTAL, T3FREE, THYROIDAB in the last 72 hours.  Invalid input(s): FREET3 Anemia work up No results for input(s): VITAMINB12, FOLATE, FERRITIN, TIBC, IRON, RETICCTPCT in the last 72 hours. Urinalysis    Component Value Date/Time   COLORURINE COLORLESS (A) 03/20/2017 2207   APPEARANCEUR CLEAR 03/20/2017 2207   LABSPEC <1.005 (L) 03/20/2017 2207   PHURINE 5.5 03/20/2017 2207   GLUCOSEU NEGATIVE 03/20/2017 2207   HGBUR NEGATIVE 03/20/2017 2207   HGBUR negative 01/19/2009 0000   BILIRUBINUR NEGATIVE 03/20/2017 2207   BILIRUBINUR 1+ 08/10/2010 [place] 03/20/2017 2207   PROTEINUR NEGATIVE 03/20/2017 2207   UROBILINOGEN 0.2 08/10/2010 1244   UROBILINOGEN  0.2 07/09/2009 2306   NITRITE NEGATIVE 03/20/2017 2207   LEUKOCYTESUR NEGATIVE 03/20/2017 2207   Sepsis Labs Invalid input(s): PROCALCITONIN,  WBC,  LACTICIDVEN Microbiology Recent Results (from the past 240 hour(s))  Urine culture     Status: None   Collection Time: 03/20/17 10:07 PM  Result Value Ref Range Status   Specimen Description URINE, RANDOM  Final   Special Requests NONE  Final   Culture   Final  NO GROWTH Performed at [facility], [place] [address], [place], [place] [address]    Report Status 03/22/2017 FINAL  Final  Blood culture (routine x 2)     Status: None (Preliminary result)   Collection Time: 03/21/17  1:00 AM  Result Value Ref Range Status   Specimen Description   Final    BLOOD LEFT ARM Performed at [facility], [address] [place], [place] [address]    Special Requests   Final    BOTTLES DRAWN AEROBIC AND ANAEROBIC Blood Culture adequate volume Performed at [facility], [address] [place], [place] [address]    Culture   Final    NO GROWTH 3 DAYS Performed at [facility], [place] [address], [place], [place] [address]    Report Status PENDING  Incomplete  Blood culture (routine x 2)     Status: None (Preliminary result)   Collection Time: 03/21/17  1:20 AM  Result Value Ref Range Status   Specimen Description   Final    BLOOD RIGHT FOREARM Performed at [facility], [place] [address] [place], [place] [address]    Special Requests   Final    BOTTLES DRAWN AEROBIC AND ANAEROBIC Blood Culture adequate volume Performed at [facility], [address] [place], [place] [address]    Culture   Final    NO GROWTH 3 DAYS Performed at [facility], [place] [address] [place], [place] [address]    Report Status PENDING  Incomplete     Time coordinating discharge: 45 minutes  SIGNED:   Tawni Millers, MD  [facility] 03/24/2017, 11:01 AM Pager [phone]  If 7PM-7AM,  please contact night-coverage [email or web address] Password [email or web address]

## 2017-03-24 NOTE — Progress Notes (Signed)
Pt discharged to home. PIV removed, AVS reviewed. Pt left unit via wheelchair with belongings, including cell phone and purse in hand. Pt to follow up with PCP outpatient. Pt to be transported home by son.

## 2017-03-24 NOTE — Plan of Care (Signed)
  Education: Knowledge of General Education information will improve 03/24/2017 0343 - Progressing by Anson Fret, RN Note POC reviewed with pt.

## 2017-03-26 LAB — CULTURE, BLOOD (ROUTINE X 2)
CULTURE: NO GROWTH
CULTURE: NO GROWTH
SPECIAL REQUESTS: ADEQUATE
SPECIAL REQUESTS: ADEQUATE

## 2017-03-27 ENCOUNTER — Ambulatory Visit: Payer: PPO | Admitting: Vascular Surgery

## 2017-03-27 ENCOUNTER — Encounter (HOSPITAL_COMMUNITY): Payer: PPO

## 2017-04-03 ENCOUNTER — Telehealth: Payer: Self-pay

## 2017-04-03 DIAGNOSIS — K559 Vascular disorder of intestine, unspecified: Secondary | ICD-10-CM | POA: Diagnosis not present

## 2017-04-03 NOTE — Telephone Encounter (Signed)
Do not usually hold Plavix for dental cleaning. .   Primary Cardiologist: No primary care provider on file.  Chart reviewed as part of pre-operative protocol coverage. Patient was contacted 04/03/2017 in reference to pre-operative risk assessment for pending surgery as outlined below.  [name] was last seen on 02/08/2018 by Kerin Ransom,   Since that day, [name] has done well .  Therefore, based on ACC/AHA guidelines, the patient would be at acceptable risk for the planned procedure without further cardiovascular testing.   I will route this recommendation to the requesting party via Epic fax function and remove from pre-op pool.  Please call with questions.  Jory Sims DNP, ANP, AACC 04/03/2017, 1:39 PM

## 2017-04-03 NOTE — Telephone Encounter (Signed)
Faxed to requesting provider via EPIC 

## 2017-04-03 NOTE — Telephone Encounter (Signed)
   [facility] Pre-operative Risk Assessment    Request for surgical clearance:  1. What type of surgery is being performed? Periodontal Exam (teeth cleaning)     2. When is this surgery scheduled? 06/04/2017  3. What type of clearance is required (medical clearance vs. Pharmacy clearance to hold med vs. Both)? Pharmacy clearance to hold med - Plavix pending   4. Are there any medications that need to be held prior to surgery and how long? Plavix - needs to know how many days to hold.   5. Practice name and name of physician performing surgery? [facility] - Dr. Essie Hart  6. What is your office phone and fax number? Phone: [phone] Fax: [phone]   7. Anesthesia type (None, local, MAC, general) ? Local   Jacinta Shoe 04/03/2017, 10:43 AM  _________________________________________________________________   (provider comments below)

## 2017-04-27 ENCOUNTER — Other Ambulatory Visit: Payer: Self-pay | Admitting: Interventional Cardiology

## 2017-04-30 ENCOUNTER — Other Ambulatory Visit: Payer: Self-pay

## 2017-04-30 MED ORDER — NITROGLYCERIN 0.4 MG SL SUBL: 0.4000 mg | SUBLINGUAL_TABLET | SUBLINGUAL | 3 refills | Status: DC | PRN

## 2017-05-01 DIAGNOSIS — R143 Flatulence: Secondary | ICD-10-CM | POA: Diagnosis not present

## 2017-05-01 DIAGNOSIS — K5790 Diverticulosis of intestine, part unspecified, without perforation or abscess without bleeding: Secondary | ICD-10-CM | POA: Diagnosis not present

## 2017-05-01 DIAGNOSIS — R142 Eructation: Secondary | ICD-10-CM | POA: Diagnosis not present

## 2017-05-01 DIAGNOSIS — K559 Vascular disorder of intestine, unspecified: Secondary | ICD-10-CM | POA: Diagnosis not present

## 2017-05-01 DIAGNOSIS — K922 Gastrointestinal hemorrhage, unspecified: Secondary | ICD-10-CM | POA: Diagnosis not present

## 2017-05-03 ENCOUNTER — Inpatient Hospital Stay: Payer: PPO | Attending: Hematology & Oncology | Admitting: Hematology & Oncology

## 2017-05-03 ENCOUNTER — Inpatient Hospital Stay: Payer: PPO

## 2017-05-03 VITALS — BP 142/66 | HR 65 | Temp 98.2°F | Resp 18 | Wt 115.0 lb

## 2017-05-03 DIAGNOSIS — B1710 Acute hepatitis C without hepatic coma: Secondary | ICD-10-CM

## 2017-05-03 DIAGNOSIS — Z79899 Other long term (current) drug therapy: Secondary | ICD-10-CM | POA: Diagnosis not present

## 2017-05-03 DIAGNOSIS — R634 Abnormal weight loss: Secondary | ICD-10-CM

## 2017-05-03 DIAGNOSIS — D751 Secondary polycythemia: Secondary | ICD-10-CM

## 2017-05-03 DIAGNOSIS — K559 Vascular disorder of intestine, unspecified: Secondary | ICD-10-CM | POA: Diagnosis not present

## 2017-05-03 DIAGNOSIS — B182 Chronic viral hepatitis C: Secondary | ICD-10-CM | POA: Insufficient documentation

## 2017-05-03 DIAGNOSIS — D45 Polycythemia vera: Secondary | ICD-10-CM

## 2017-05-03 LAB — CBC WITH DIFFERENTIAL (CANCER CENTER ONLY)
BASOS ABS: 0 10*3/uL (ref 0.0–0.1)
Basophils Relative: 0 %
EOS PCT: 1 %
Eosinophils Absolute: 0 10*3/uL (ref 0.0–0.5)
HCT: 38.9 % (ref 34.8–46.6)
HEMOGLOBIN: 13 g/dL (ref 11.6–15.9)
LYMPHS PCT: 13 %
Lymphs Abs: 1.1 10*3/uL (ref 0.9–3.3)
MCH: 30.3 pg (ref 26.0–34.0)
MCHC: 33.4 g/dL (ref 32.0–36.0)
MCV: 90.7 fL (ref 81.0–101.0)
Monocytes Absolute: 1.5 10*3/uL — ABNORMAL HIGH (ref 0.1–0.9)
Monocytes Relative: 17 %
NEUTROS ABS: 5.9 10*3/uL (ref 1.5–6.5)
NEUTROS PCT: 69 %
PLATELETS: 287 10*3/uL (ref 145–400)
RBC: 4.29 MIL/uL (ref 3.70–5.32)
RDW: 12.9 % (ref 11.1–15.7)
WBC: 8.6 10*3/uL (ref 3.9–10.0)

## 2017-05-03 LAB — COMPREHENSIVE METABOLIC PANEL
ALT: 13 U/L — AB (ref 14–54)
AST: 21 U/L (ref 15–41)
Albumin: 3.7 g/dL (ref 3.5–5.0)
Alkaline Phosphatase: 83 U/L (ref 38–126)
Anion gap: 15 (ref 5–15)
BUN: 12 mg/dL (ref 6–20)
CHLORIDE: 90 mmol/L — AB (ref 101–111)
CO2: 25 mmol/L (ref 22–32)
CREATININE: 0.69 mg/dL (ref 0.44–1.00)
Calcium: 8.9 mg/dL (ref 8.9–10.3)
GFR calc Af Amer: >60 mL/min (ref >60)
Glucose, Bld: 106 mg/dL — ABNORMAL HIGH (ref 65–99)
Potassium: 3.3 mmol/L — ABNORMAL LOW (ref 3.5–5.1)
Sodium: 130 mmol/L — ABNORMAL LOW (ref 135–145)
Total Bilirubin: 0.6 mg/dL (ref 0.3–1.2)
Total Protein: 7.2 g/dL (ref 6.5–8.1)

## 2017-05-03 NOTE — Progress Notes (Signed)
Hematology and Oncology Follow Up Visit  [name] [id number] 24-Aug-1950 67 y.o. 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 is here today for a follow-up.  Surprisingly enough, since we last saw her, she has been hospitalized.  She apparently had ischemic colitis.  I think the only reason I can think that she would have something like this would be smoking.  She has stopped smoking.  She did have a colonoscopy.  She had a biopsy done.  The biopsy report ([id number]) showed ischemic colitis.  She was given antibiotics.  If she does have a gastroenterologist.  She got better now is getting worse again.  He put her on Hyoscamine.   We have gotten her polycythemia under very good control.  She has not been phlebotomized for a while.  She has lost a little bit of weight.  There is been no nausea or vomiting.  She is had no cough.  She is had no fever.  She has not noted any bleeding.  Currently, her performance status is ECOG 1.   Medications:  Allergies as of 05/03/2017      Reactions   Diphenhydramine Hcl Hives   Bactrim [sulfamethoxazole-trimethoprim] Nausea And Vomiting      Medication List        Accurate as of 05/03/17  5:14 PM. Always use your most recent med list.          acetaminophen 500 MG tablet Commonly known as:  TYLENOL Take 500-1,000 mg by mouth every 6 (six) hours as needed for mild pain or headache (depends on pain if takes 1-2 tablets).   albuterol 108 (90 Base) MCG/ACT inhaler Commonly known as:  PROVENTIL HFA;VENTOLIN HFA Inhale 2 puffs into the lungs every 6 (six) hours as needed for wheezing or shortness of breath.   ALPRAZolam 0.5 MG tablet Commonly known as:  XANAX Take 0.5 mg by  mouth at bedtime.   Biotin 1000 MCG tablet Take 1,000 mcg by mouth daily.   clopidogrel 75 MG tablet Commonly known as:  PLAVIX TAKE 1 TABLET BY MOUTH ONCE DAILY   clotrimazole-betamethasone cream Commonly known as:  LOTRISONE use 1 application to affected area as needed for sore areas on body   dicyclomine 10 MG capsule Commonly known as:  BENTYL Take 1 capsule (10 mg total) by mouth 3 (three) times daily as needed for spasms (abdominal pain).   escitalopram 20 MG tablet Commonly known as:  LEXAPRO Take 20 mg by mouth daily.   hydrochlorothiazide 12.5 MG capsule Commonly known as:  MICROZIDE TAKE 1 CAPSULE BY MOUTH ONCE DAILY   HYOSCYAMINE PO Take by mouth.   lansoprazole 30 MG capsule Commonly known as:  PREVACID Take 30 mg by mouth daily as needed (for ulcer/acid reflex). Ulcer/ acid reflux   metoprolol succinate 100 MG 24 hr tablet Commonly known as:  TOPROL-XL TAKE 1 & 1/2 (ONE & ONE-HALF) TABLETS BY MOUTH ONCE DAILY   mupirocin ointment 2 % Commonly known as:  BACTROBAN Apply twice a day to right arm wound   nicotine 21 mg/24hr patch Commonly known as:  NICODERM CQ - dosed in mg/24 hours Place 1 patch (21 mg total) onto the skin daily.   nitroGLYCERIN 0.4 MG  SL tablet Commonly known as:  NITROSTAT Place 1 tablet (0.4 mg total) under the tongue every 5 (five) minutes as needed for chest pain.   simvastatin 10 MG tablet Commonly known as:  ZOCOR Take 10 mg by mouth every other day.   VITAMIN C PO Take 2 tablets by mouth daily.   vitamin E 400 UNIT capsule Take 400 Units by mouth daily.       Allergies:  Allergies  Allergen Reactions  . Diphenhydramine Hcl Hives  . Bactrim [Sulfamethoxazole-Trimethoprim] Nausea And Vomiting    Past Medical History, Surgical history, Social history, and Family History were reviewed and updated.  Review of Systems: Review of Systems  Constitutional: Positive for malaise/fatigue.  HENT: Negative.   Eyes:  Negative.   Respiratory: Negative.   Cardiovascular: Negative.   Gastrointestinal: Positive for abdominal pain, diarrhea and nausea.  Genitourinary: Negative.   Musculoskeletal: Negative.   Skin: Negative.   Neurological: Negative.   Endo/Heme/Allergies: Negative.   Psychiatric/Behavioral: Negative.      Physical Exam:  weight is 115 lb (52.2 kg). Her oral temperature is 98.2 F (36.8 C). Her blood pressure is 142/66 (abnormal) and her pulse is 65. Her respiration is 18 and oxygen saturation is 100%.   Wt Readings from Last 3 Encounters:  05/03/17 115 lb (52.2 kg)  03/21/17 113 lb 5.1 oz (51.4 kg)  01/03/17 116 lb (52.6 kg)    Physical Exam  Constitutional: She is oriented to person, place, and time.  HENT:  Head: Normocephalic and atraumatic.  Mouth/Throat: Oropharynx is clear and moist.  Eyes: EOM are normal. Pupils are equal, round, and reactive to light.  Neck: Normal range of motion.  Cardiovascular: Normal rate, regular rhythm and normal heart sounds.  Pulmonary/Chest: Effort normal and breath sounds normal.  Abdominal: Soft. Bowel sounds are normal.  Musculoskeletal: Normal range of motion. She exhibits no edema, tenderness or deformity.  Lymphadenopathy:    She has no cervical adenopathy.  Neurological: She is alert and oriented to person, place, and time.  Skin: Skin is warm and dry. No rash noted. No erythema.  Psychiatric: She has a normal mood and affect. Her behavior is normal. Judgment and thought content normal.  Vitals reviewed.   Lab Results  Component Value Date   WBC 8.6 05/03/2017   HGB 12.3 03/24/2017   HCT 38.9 05/03/2017   MCV 90.7 05/03/2017   PLT 287 05/03/2017   Lab Results  Component Value Date   FERRITIN 14 01/03/2017   IRON 63 01/03/2017   TIBC 436 01/03/2017   UIBC 373 01/03/2017   IRONPCTSAT 14 (L) 01/03/2017   Lab Results  Component Value Date   RETICCTPCT 1.2 08/03/2010   RBC 4.29 05/03/2017   RETICCTABS 61.3 08/03/2010    No results found for: KPAFRELGTCHN, LAMBDASER, KAPLAMBRATIO No results found for: IGGSERUM, IGA, IGMSERUM No results found for: Odetta Pink, SPEI   Chemistry      Component Value Date/Time   NA 139 03/23/2017 0549   NA 139 01/03/2017 1327   NA 136 12/28/2015 1311   K 4.0 03/23/2017 0549   K 3.7 01/03/2017 1327   K 4.9 12/28/2015 1311   CL 102 03/23/2017 0549   CL 94 (L) 01/03/2017 1327   CO2 29 03/23/2017 0549   CO2 31 01/03/2017 1327   CO2 28 12/28/2015 1311   BUN <5 (L) 03/23/2017 0549   BUN 12 01/03/2017 1327   BUN 10.4 12/28/2015 1311  CREATININE 0.79 03/23/2017 0549   CREATININE 1.0 01/03/2017 1327   CREATININE 0.9 12/28/2015 1311      Component Value Date/Time   CALCIUM 8.7 (L) 03/23/2017 0549   CALCIUM 8.8 01/03/2017 1327   CALCIUM 9.6 12/28/2015 1311   ALKPHOS 71 03/20/2017 1856   ALKPHOS 85 (H) 01/03/2017 1327   ALKPHOS 83 12/28/2015 1311   AST 33 03/20/2017 1856   AST 25 01/03/2017 1327   AST 25 12/28/2015 1311   ALT 14 03/20/2017 1856   ALT 19 01/03/2017 1327   ALT 13 12/28/2015 1311   BILITOT 1.0 03/20/2017 1856   BILITOT 0.60 01/03/2017 1327   BILITOT 0.74 12/28/2015 1311     Impression and Plan: Ms. Ruppert is a 67 yo white female with polycythemia. She has responded nicely to phlebotomy.  I do feel bad that she is having these issues with the colitis.  Her hemoglobin and hematocrit doing quite well right now.  I think we need to follow her closely.  I will plan to see her back in another 6 weeks.   [name], MD 3/14/20195:14 PM

## 2017-05-04 LAB — AFP TUMOR MARKER: AFP, Serum, Tumor Marker: 7.9 ng/mL (ref 0.0–8.3)

## 2017-05-10 DIAGNOSIS — M8588 Other specified disorders of bone density and structure, other site: Secondary | ICD-10-CM | POA: Diagnosis not present

## 2017-05-22 ENCOUNTER — Ambulatory Visit: Payer: PPO | Admitting: Vascular Surgery

## 2017-05-22 ENCOUNTER — Ambulatory Visit (HOSPITAL_COMMUNITY)
Admission: RE | Admit: 2017-05-22 | Discharge: 2017-05-22 | Disposition: A | Discharge status: Home / Self Care | Payer: PPO | Source: Ambulatory Visit | Attending: Vascular Surgery | Admitting: Vascular Surgery

## 2017-05-22 ENCOUNTER — Encounter: Payer: Self-pay | Admitting: Vascular Surgery

## 2017-05-22 VITALS — BP 174/81 | HR 59 | Resp 16 | Ht 60.0 in | Wt 114.0 lb

## 2017-05-22 DIAGNOSIS — Z9889 Other specified postprocedural states: Secondary | ICD-10-CM

## 2017-05-22 DIAGNOSIS — Z48812 Encounter for surgical aftercare following surgery on the circulatory system: Secondary | ICD-10-CM | POA: Insufficient documentation

## 2017-05-22 DIAGNOSIS — I6523 Occlusion and stenosis of bilateral carotid arteries: Secondary | ICD-10-CM | POA: Insufficient documentation

## 2017-05-22 NOTE — Progress Notes (Signed)
[facility] [place]  Patient name: [name] MRN: [id number] DOB: [birth year] Sex: female  REASON FOR VISIT: Follow-up carotid disease  HPI: [name] is a 67 y.o. female here for follow-up of carotid disease.  She had undergone a left carotid endarterectomy June 2018.  Had a remote history of right carotid endarterectomy in 2007.  She has had no neurologic deficits.  Did have a recent admission to the hospital with the diagnosis of acute ischemic colitis.  Only she did have blood in her stool and was in the hospital for approximately 5 days with bowel rest.  I reviewed her CT scan from this admission in January with the patient.  This does show calcification of her aortic wall but widely patent celiac and superior mesenteric artery.  Past Medical History:  Diagnosis Date  . Anxiety   . Arthritis   . CAD (coronary artery disease)   . COPD (chronic obstructive pulmonary disease) ([place])   . Depression   . Dyspnea   . GERD (gastroesophageal reflux disease)   . Hepatitis C   . Hypertension   . Peripheral vascular disease ([place])   . Polycythemia 2009  . Seizures ([place]) 1970   x1- pt. reports that there was never any causative agent      Family History  Problem Relation Age of Onset  . Heart disease Mother   . Heart disease Father     SOCIAL HISTORY: Social History   Tobacco Use  . Smoking status: Former Smoker    Packs/day: 0.25    Years: 48.00    Pack years: 12.00    Types: Cigarettes    Start date: 03/20/1968  . Smokeless tobacco: Never Used  . Tobacco comment: "1 pk lasts about 3 days", uses VAPE, uses nicorette patch   Substance Use Topics  . Alcohol use: Yes    Alcohol/week: 0.0 oz    Comment: social- 2-3 x's per month     Allergies  Allergen Reactions  . Diphenhydramine Hcl Hives  . Bactrim [Sulfamethoxazole-Trimethoprim] Nausea And Vomiting    Current Outpatient Medications    Medication Sig Dispense Refill  . acetaminophen (TYLENOL) 500 MG tablet Take 500-1,000 mg by mouth every 6 (six) hours as needed for mild pain or headache (depends on pain if takes 1-2 tablets).     Marland Kitchen albuterol (PROVENTIL HFA;VENTOLIN HFA) 108 (90 BASE) MCG/ACT inhaler Inhale 2 puffs into the lungs every 6 (six) hours as needed for wheezing or shortness of breath. 1 Inhaler 2  . ALPRAZolam (XANAX) 0.5 MG tablet Take 0.5 mg by mouth at bedtime.     . Ascorbic Acid (VITAMIN C PO) Take 2 tablets by mouth daily.    . Biotin 1000 MCG tablet Take 1,000 mcg by mouth daily.    . clopidogrel (PLAVIX) 75 MG tablet TAKE 1 TABLET BY MOUTH ONCE DAILY 30 tablet 0  . clotrimazole-betamethasone (LOTRISONE) cream use 1 application to affected area as needed for sore areas on body    . dicyclomine (BENTYL) 10 MG capsule Take 1 capsule (10 mg total) by mouth 3 (three) times daily as needed for spasms (abdominal pain). 10 capsule 0  . escitalopram (LEXAPRO) 20 MG tablet Take 20 mg by mouth daily.    . hydrochlorothiazide (MICROZIDE) 12.5 MG capsule TAKE 1 CAPSULE BY MOUTH ONCE DAILY 90 capsule 3  . Hyoscyamine Sulfate (HYOSCYAMINE PO) Take by mouth.    . lansoprazole (PREVACID) 30 MG capsule Take 30 mg by  mouth daily as needed (for ulcer/acid reflex). Ulcer/ acid reflux    . metoprolol succinate (TOPROL-XL) 100 MG 24 hr tablet TAKE 1 & 1/2 (ONE & ONE-HALF) TABLETS BY MOUTH ONCE DAILY 45 tablet 3  . mupirocin ointment (BACTROBAN) 2 % Apply twice a day to right arm wound (Patient taking differently: Apply 1 application topically as needed. ) 30 g 1  . nitroGLYCERIN (NITROSTAT) 0.4 MG SL tablet Place 1 tablet (0.4 mg total) under the tongue every 5 (five) minutes as needed for chest pain. 25 tablet 3  . simvastatin (ZOCOR) 10 MG tablet Take 10 mg by mouth every other day.     . vitamin E 400 UNIT capsule Take 400 Units by mouth daily.    . nicotine (NICODERM CQ - DOSED IN MG/24 HOURS) 21 mg/24hr patch Place 1 patch (21  mg total) onto the skin daily. (Patient not taking: Reported on 05/22/2017) 28 patch 0   No current facility-administered medications for this visit.     REVIEW OF SYSTEMS:  [X]  denotes positive finding, [ ]  denotes negative finding Cardiac  Comments:  Chest pain or chest pressure: x   Shortness of breath upon exertion:    Short of breath when lying flat:    Irregular heart rhythm:        Vascular    Pain in calf, thigh, or hip brought on by ambulation:    Pain in feet at night that wakes you up from your sleep:     Blood clot in your veins:    Leg swelling:           PHYSICAL EXAM: Vitals:   05/22/17 1157 05/22/17 1158  BP: (!) 189/81 (!) 174/81  Pulse: (!) 59   Resp: 16   SpO2: 92%   Weight: 114 lb (51.7 kg)   Height: 5' (1.524 m)     GENERAL: The patient is a well-nourished female, in no acute distress. The vital signs are documented above. CARDIOVASCULAR: Well-healed carotid incisions bilaterally with no bruits PULMONARY: There is good air exchange  MUSCULOSKELETAL: There are no major deformities or cyanosis. NEUROLOGIC: No focal weakness or paresthesias are detected. SKIN: There are no ulcers or rashes noted. PSYCHIATRIC: The patient has a normal affect.  DATA:  Carotid duplex today shows patent endarterectomies bilaterally with no evidence of significant stenosis  MEDICAL ISSUES: Stable overall from a carotid standpoint.  We will continue her usual activities.  We will see her again in 1 year with repeat carotid duplex follow-up.  She will notify should she develop any vascular issues in the interim    [name], MD [facility] Tel [phone] Pager [phone]

## 2017-05-25 ENCOUNTER — Other Ambulatory Visit: Payer: Self-pay

## 2017-05-25 ENCOUNTER — Other Ambulatory Visit: Payer: Self-pay | Admitting: Interventional Cardiology

## 2017-05-25 MED ORDER — CLOPIDOGREL BISULFATE 75 MG PO TABS: 75.0000 mg | ORAL_TABLET | Freq: Every day | ORAL | 0 refills | Status: DC

## 2017-05-30 ENCOUNTER — Other Ambulatory Visit: Payer: Self-pay | Admitting: Interventional Cardiology

## 2017-05-30 MED ORDER — CLOPIDOGREL BISULFATE 75 MG PO TABS: 75.0000 mg | ORAL_TABLET | Freq: Every day | ORAL | 0 refills | Status: DC

## 2017-06-07 ENCOUNTER — Other Ambulatory Visit: Payer: Self-pay | Admitting: Interventional Cardiology

## 2017-06-07 MED ORDER — METOPROLOL SUCCINATE ER 100 MG PO TB24: ORAL_TABLET | ORAL | 0 refills | Status: DC

## 2017-06-07 NOTE — Addendum Note (Signed)
Addended by: Derl Barrow on: 06/07/2017 02:42 PM   Modules accepted: Orders

## 2017-06-13 ENCOUNTER — Other Ambulatory Visit: Payer: Self-pay | Admitting: Interventional Cardiology

## 2017-06-14 ENCOUNTER — Inpatient Hospital Stay: Payer: PPO

## 2017-06-14 ENCOUNTER — Inpatient Hospital Stay: Payer: PPO | Attending: Hematology & Oncology | Admitting: Hematology & Oncology

## 2017-06-14 VITALS — BP 152/69 | HR 58 | Resp 18 | Wt 116.0 lb

## 2017-06-14 DIAGNOSIS — Z720 Tobacco use: Secondary | ICD-10-CM | POA: Insufficient documentation

## 2017-06-14 DIAGNOSIS — D45 Polycythemia vera: Secondary | ICD-10-CM | POA: Diagnosis not present

## 2017-06-14 LAB — CBC WITH DIFFERENTIAL (CANCER CENTER ONLY)
BASOS PCT: 0 %
Basophils Absolute: 0 10*3/uL (ref 0.0–0.1)
Eosinophils Absolute: 0.1 10*3/uL (ref 0.0–0.5)
Eosinophils Relative: 1 %
HEMATOCRIT: 37 % (ref 34.8–46.6)
Hemoglobin: 12.6 g/dL (ref 11.6–15.9)
LYMPHS ABS: 1.8 10*3/uL (ref 0.9–3.3)
LYMPHS PCT: 31 %
MCH: 31.1 pg (ref 26.0–34.0)
MCHC: 34.1 g/dL (ref 32.0–36.0)
MCV: 91.4 fL (ref 81.0–101.0)
MONO ABS: 0.6 10*3/uL (ref 0.1–0.9)
MONOS PCT: 10 %
NEUTROS ABS: 3.3 10*3/uL (ref 1.5–6.5)
NEUTROS PCT: 58 %
Platelet Count: 251 10*3/uL (ref 145–400)
RBC: 4.05 MIL/uL (ref 3.70–5.32)
RDW: 13.3 % (ref 11.1–15.7)
WBC Count: 5.8 10*3/uL (ref 3.9–10.0)

## 2017-06-14 LAB — CMP (CANCER CENTER ONLY)
ALBUMIN: 3.4 g/dL — AB (ref 3.5–5.0)
ALK PHOS: 88 U/L — AB (ref 26–84)
ALT: 20 U/L (ref 10–47)
ANION GAP: 7 (ref 5–15)
AST: 25 U/L (ref 11–38)
BILIRUBIN TOTAL: 0.8 mg/dL (ref 0.2–1.6)
BUN: 19 mg/dL (ref 7–22)
CALCIUM: 9.2 mg/dL (ref 8.0–10.3)
CHLORIDE: 97 mmol/L — AB (ref 98–108)
CO2: 32 mmol/L (ref 18–33)
CREATININE: 0.9 mg/dL (ref 0.60–1.20)
Glucose, Bld: 93 mg/dL (ref 73–118)
Potassium: 3.7 mmol/L (ref 3.3–4.7)
Sodium: 136 mmol/L (ref 128–145)
Total Protein: 6.6 g/dL (ref 6.4–8.1)

## 2017-06-14 NOTE — Progress Notes (Addendum)
Hematology and Oncology Follow Up Visit  [name] [id number] Feb 12, 1951 67 y.o. 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  She is doing quite well.  She looks much better than when I last saw her.  She really has gotten over this episode of bowel ischemia.  She is only smoking 2-3 cigarettes a day right now.  She is trying her best to cut back and stop cigarettes.  She has had no problems with fever.  She has had no cough or shortness of breath.  She has had no change in bowel or bladder habits.  She has had no abdominal pain.  Currently, her performance status is ECOG 1.   Medications:  Allergies as of 06/14/2017      Reactions   Diphenhydramine Hcl Hives   Bactrim [sulfamethoxazole-trimethoprim] Nausea And Vomiting      Medication List        Accurate as of 06/14/17  3:43 PM. Always use your most recent med list.          acetaminophen 500 MG tablet Commonly known as:  TYLENOL Take 500-1,000 mg by mouth every 6 (six) hours as needed for mild pain or headache (depends on pain if takes 1-2 tablets).   albuterol 108 (90 Base) MCG/ACT inhaler Commonly known as:  PROVENTIL HFA;VENTOLIN HFA Inhale 2 puffs into the lungs every 6 (six) hours as needed for wheezing or shortness of breath.   ALPRAZolam 0.5 MG tablet Commonly known as:  XANAX Take 0.5 mg by mouth at bedtime.   Biotin 1000 MCG tablet Take 1,000 mcg by mouth daily.   clopidogrel 75 MG tablet Commonly known as:  PLAVIX Take 1 tablet (75 mg total) by mouth daily. Please make overdue yearly appt with [name] before anymore refills. 1st attempt   clotrimazole-betamethasone cream Commonly known as:  LOTRISONE use 1  application to affected area as needed for sore areas on body   escitalopram 20 MG tablet Commonly known as:  LEXAPRO Take 20 mg by mouth daily.   hydrochlorothiazide 12.5 MG capsule Commonly known as:  MICROZIDE Take 1 capsule (12.5 mg total) by mouth daily. Patient needs to call and schedule an appointment for further refills 1st attempt   HYOSCYAMINE PO Take by mouth.   lansoprazole 30 MG capsule Commonly known as:  PREVACID Take 30 mg by mouth daily as needed (for ulcer/acid reflex). Ulcer/ acid reflux   metoprolol succinate 100 MG 24 hr tablet Commonly known as:  TOPROL-XL Take 1  & 1/2 (one & one-half) tablets by mouth daily. Please make overdue appt. With [name] 1st attempt. Thank You.   mupirocin ointment 2 % Commonly known as:  BACTROBAN Apply twice a day to right arm wound   nitroGLYCERIN 0.4 MG SL tablet Commonly known as:  NITROSTAT Place 1 tablet (0.4 mg total) under the tongue every 5 (five) minutes as needed for chest pain.   simvastatin 10 MG tablet Commonly known as:  ZOCOR Take 10 mg by mouth every other day.   VITAMIN C PO Take 2 tablets by mouth daily.   vitamin E 400 UNIT capsule Take 400 Units by mouth daily.  Allergies:  Allergies  Allergen Reactions  . Diphenhydramine Hcl Hives  . Bactrim [Sulfamethoxazole-Trimethoprim] Nausea And Vomiting    Past Medical History, Surgical history, Social history, and Family History were reviewed and updated.  Review of Systems: Review of Systems  Constitutional: Positive for malaise/fatigue.  HENT: Negative.   Eyes: Negative.   Respiratory: Negative.   Cardiovascular: Negative.   Gastrointestinal: Positive for abdominal pain, diarrhea and nausea.  Genitourinary: Negative.   Musculoskeletal: Negative.   Skin: Negative.   Neurological: Negative.   Endo/Heme/Allergies: Negative.   Psychiatric/Behavioral: Negative.      Physical Exam:  weight is 116 lb (52.6 kg). Her blood pressure is  152/69 (abnormal) and her pulse is 58 (abnormal). Her respiration is 18 and oxygen saturation is 99%.   Wt Readings from Last 3 Encounters:  06/14/17 116 lb (52.6 kg)  05/22/17 114 lb (51.7 kg)  05/03/17 115 lb (52.2 kg)    Physical Exam  Constitutional: She is oriented to person, place, and time.  HENT:  Head: Normocephalic and atraumatic.  Mouth/Throat: Oropharynx is clear and moist.  Eyes: Pupils are equal, round, and reactive to light. EOM are normal.  Neck: Normal range of motion.  Cardiovascular: Normal rate, regular rhythm and normal heart sounds.  Pulmonary/Chest: Effort normal and breath sounds normal.  Abdominal: Soft. Bowel sounds are normal.  Musculoskeletal: Normal range of motion. She exhibits no edema, tenderness or deformity.  Lymphadenopathy:    She has no cervical adenopathy.  Neurological: She is alert and oriented to person, place, and time.  Skin: Skin is warm and dry. No rash noted. No erythema.  Psychiatric: She has a normal mood and affect. Her behavior is normal. Judgment and thought content normal.  Vitals reviewed.   Lab Results  Component Value Date   WBC 5.8 06/14/2017   HGB 12.6 06/14/2017   HCT 37.0 06/14/2017   MCV 91.4 06/14/2017   PLT 251 06/14/2017   Lab Results  Component Value Date   FERRITIN 14 01/03/2017   IRON 63 01/03/2017   TIBC 436 01/03/2017   UIBC 373 01/03/2017   IRONPCTSAT 14 (L) 01/03/2017   Lab Results  Component Value Date   RETICCTPCT 1.2 08/03/2010   RBC 4.05 06/14/2017   RETICCTABS 61.3 08/03/2010   No results found for: KPAFRELGTCHN, LAMBDASER, KAPLAMBRATIO No results found for: IGGSERUM, IGA, IGMSERUM No results found for: Odetta Pink, SPEI   Chemistry      Component Value Date/Time   NA 136 06/14/2017 1506   NA 139 01/03/2017 1327   NA 136 12/28/2015 1311   K 3.7 06/14/2017 1506   K 3.7 01/03/2017 1327   K 4.9 12/28/2015 1311   CL 97 (L)  06/14/2017 1506   CL 94 (L) 01/03/2017 1327   CO2 32 06/14/2017 1506   CO2 31 01/03/2017 1327   CO2 28 12/28/2015 1311   BUN 19 06/14/2017 1506   BUN 12 01/03/2017 1327   BUN 10.4 12/28/2015 1311   CREATININE 0.90 06/14/2017 1506   CREATININE 1.0 01/03/2017 1327   CREATININE 0.9 12/28/2015 1311      Component Value Date/Time   CALCIUM 9.2 06/14/2017 1506   CALCIUM 8.8 01/03/2017 1327   CALCIUM 9.6 12/28/2015 1311   ALKPHOS 88 (H) 06/14/2017 1506   ALKPHOS 85 (H) 01/03/2017 1327   ALKPHOS 83 12/28/2015 1311   AST 25 06/14/2017 1506   AST 25 12/28/2015 1311   ALT 20 06/14/2017 1506   ALT  19 01/03/2017 1327   ALT 13 12/28/2015 1311   BILITOT 0.8 06/14/2017 1506   BILITOT 0.74 12/28/2015 1311     Impression and Plan: Ms. Phebus is a 67 yo white female with polycythemia. She has responded nicely to phlebotomy.  I am glad that she is doing so well.  She looks like she is back to her "old self."  We will go ahead and plan to get her back in 4 months.  I think this would be very reasonable.     [name], MD 4/25/20193:43 PM

## 2017-06-15 LAB — IRON AND TIBC
Iron: 87 ug/dL (ref 41–142)
SATURATION RATIOS: 25 % (ref 21–57)
TIBC: 355 ug/dL (ref 236–444)
UIBC: 268 ug/dL

## 2017-06-15 LAB — FERRITIN: Ferritin: 30 ng/mL (ref 9–269)

## 2017-06-27 ENCOUNTER — Other Ambulatory Visit: Payer: Self-pay | Admitting: Interventional Cardiology

## 2017-07-19 ENCOUNTER — Other Ambulatory Visit: Payer: Self-pay | Admitting: Interventional Cardiology

## 2017-08-06 ENCOUNTER — Other Ambulatory Visit: Payer: Self-pay | Admitting: Interventional Cardiology

## 2017-08-07 ENCOUNTER — Other Ambulatory Visit: Payer: Self-pay | Admitting: Interventional Cardiology

## 2017-08-11 ENCOUNTER — Other Ambulatory Visit: Payer: Self-pay | Admitting: Interventional Cardiology

## 2017-08-13 ENCOUNTER — Other Ambulatory Visit: Payer: Self-pay

## 2017-08-13 MED ORDER — HYDROCHLOROTHIAZIDE 12.5 MG PO CAPS: 12.5000 mg | ORAL_CAPSULE | Freq: Every day | ORAL | 0 refills | Status: DC

## 2017-08-28 ENCOUNTER — Other Ambulatory Visit: Payer: Self-pay | Admitting: Interventional Cardiology

## 2017-08-29 ENCOUNTER — Other Ambulatory Visit: Payer: Self-pay | Admitting: Interventional Cardiology

## 2017-09-16 ENCOUNTER — Other Ambulatory Visit: Payer: Self-pay | Admitting: Interventional Cardiology

## 2017-09-18 ENCOUNTER — Other Ambulatory Visit: Payer: Self-pay | Admitting: Interventional Cardiology

## 2017-09-18 NOTE — Telephone Encounter (Signed)
°*  STAT* If patient is at the pharmacy, call can be transferred to refill team.   1. Which medications need to be refilled? (please list name of each medication and dose if known) Hydrochlorothiazide and  Metoprolol-need  enough until her appt on 12-05-17 2. Which pharmacy/location (including street and city if local pharmacy) is medication to be sent to?[facility] [phone]  3. Do they need a 30 day or 90 day supply?45 for Metoprolol and Hydrochlorothiazide #30 and enough for refills until October

## 2017-09-19 MED ORDER — HYDROCHLOROTHIAZIDE 12.5 MG PO CAPS: 12.5000 mg | ORAL_CAPSULE | Freq: Every day | ORAL | 2 refills | Status: DC

## 2017-09-19 MED ORDER — METOPROLOL SUCCINATE ER 100 MG PO TB24: ORAL_TABLET | ORAL | 2 refills | Status: DC

## 2017-09-19 NOTE — Telephone Encounter (Signed)
Pt's medications were sent to pt's pharmacy as requested. Confirmation received.  

## 2017-10-02 ENCOUNTER — Other Ambulatory Visit: Payer: Self-pay | Admitting: *Deleted

## 2017-10-02 MED ORDER — CLOPIDOGREL BISULFATE 75 MG PO TABS: 75.0000 mg | ORAL_TABLET | Freq: Every day | ORAL | 1 refills | Status: DC

## 2017-10-15 ENCOUNTER — Other Ambulatory Visit: Payer: Self-pay

## 2017-10-15 ENCOUNTER — Encounter: Payer: Self-pay | Admitting: Hematology & Oncology

## 2017-10-15 ENCOUNTER — Inpatient Hospital Stay: Payer: PPO

## 2017-10-15 ENCOUNTER — Inpatient Hospital Stay: Payer: PPO | Attending: Hematology & Oncology | Admitting: Hematology & Oncology

## 2017-10-15 VITALS — BP 192/68 | HR 61 | Temp 98.3°F | Resp 18 | Wt 114.0 lb

## 2017-10-15 DIAGNOSIS — Z720 Tobacco use: Secondary | ICD-10-CM | POA: Diagnosis not present

## 2017-10-15 DIAGNOSIS — D45 Polycythemia vera: Secondary | ICD-10-CM | POA: Diagnosis not present

## 2017-10-15 LAB — CMP (CANCER CENTER ONLY)
ALT: 16 U/L (ref 0–44)
AST: 29 U/L (ref 15–41)
Albumin: 4.3 g/dL (ref 3.5–5.0)
Alkaline Phosphatase: 83 U/L (ref 38–126)
Anion gap: 14 (ref 5–15)
BILIRUBIN TOTAL: 0.5 mg/dL (ref 0.3–1.2)
BUN: 11 mg/dL (ref 8–23)
CHLORIDE: 97 mmol/L — AB (ref 98–111)
CO2: 30 mmol/L (ref 22–32)
CREATININE: 0.84 mg/dL (ref 0.44–1.00)
Calcium: 10.3 mg/dL (ref 8.9–10.3)
Glucose, Bld: 140 mg/dL — ABNORMAL HIGH (ref 70–99)
POTASSIUM: 4.2 mmol/L (ref 3.5–5.1)
Sodium: 141 mmol/L (ref 135–145)
TOTAL PROTEIN: 7.8 g/dL (ref 6.5–8.1)

## 2017-10-15 LAB — CBC WITH DIFFERENTIAL (CANCER CENTER ONLY)
BASOS ABS: 0 10*3/uL (ref 0.0–0.1)
Basophils Relative: 0 %
EOS ABS: 0.1 10*3/uL (ref 0.0–0.5)
EOS PCT: 1 %
HCT: 47.2 % — ABNORMAL HIGH (ref 34.8–46.6)
HEMOGLOBIN: 15.3 g/dL (ref 11.6–15.9)
LYMPHS PCT: 25 %
Lymphs Abs: 1.8 10*3/uL (ref 0.9–3.3)
MCH: 30.7 pg (ref 26.0–34.0)
MCHC: 32.4 g/dL (ref 32.0–36.0)
MCV: 94.6 fL (ref 81.0–101.0)
Monocytes Absolute: 0.8 10*3/uL (ref 0.1–0.9)
Monocytes Relative: 11 %
NEUTROS PCT: 63 %
Neutro Abs: 4.6 10*3/uL (ref 1.5–6.5)
PLATELETS: 259 10*3/uL (ref 145–400)
RBC: 4.99 MIL/uL (ref 3.70–5.32)
RDW: 12.9 % (ref 11.1–15.7)
WBC: 7.2 10*3/uL (ref 3.9–10.0)

## 2017-10-15 MED ORDER — TELMISARTAN 20 MG PO TABS: 20.0000 mg | ORAL_TABLET | Freq: Every day | ORAL | 2 refills | Status: DC

## 2017-10-15 NOTE — Progress Notes (Signed)
Hematology and Oncology Follow Up Visit  [name] [id number] [birth year] 67 y.o. 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  She actually looks quite good.  However, she does not feel all that well.  She thinks that she probably needs to have a phlebotomy.  Indeed, she is right.  Her hematocrit is 47.2.  Her last phlebotomy was about 10 months ago.  She started smoking again.  She only stopped for a couple days and then has restarted.  She is very nice and tan.  She and the family had a great time at the beach last month.  She is busy taking care of her mom who is 13 years old.  Her mom has macular degeneration.  This is causing a lot of problems but [name] is doing a great job.  She is had no change in bowel or bladder habits.  She did have this episode of ischemic colitis back in I think April.  Thankfully, this is not flared up on her.    Currently, her performance status is ECOG 1.   Medications:  Allergies as of 10/15/2017      Reactions   Diphenhydramine Hcl Hives   Other reaction(s): Hives   Bactrim [sulfamethoxazole-trimethoprim] Nausea And Vomiting   Sulfamethoxazole-trimethoprim Nausea And Vomiting   Other reaction(s): Nausea And Vomiting      Medication List        Accurate as of 10/15/17  2:02 PM. Always use your most recent med list.          acetaminophen 500 MG tablet Commonly known as:  TYLENOL Take 500-1,000 mg by mouth every 6 (six) hours as needed for mild pain or headache (depends on pain if takes 1-2 tablets).   albuterol 108 (90 Base) MCG/ACT inhaler Commonly known as:  PROVENTIL HFA;VENTOLIN HFA Inhale 2 puffs into the lungs every 6 (six) hours as needed  for wheezing or shortness of breath.   ALPRAZolam 0.5 MG tablet Commonly known as:  XANAX Take 0.5 mg by mouth at bedtime.   Biotin 1000 MCG tablet Take 1,000 mcg by mouth daily.   clopidogrel 75 MG tablet Commonly known as:  PLAVIX Take 1 tablet (75 mg total) by mouth daily. Please keep upcoming appointment for additional refills thanks   clotrimazole-betamethasone cream Commonly known as:  LOTRISONE use 1 application to affected area as needed for sore areas on body   escitalopram 20 MG tablet Commonly known as:  LEXAPRO Take 20 mg by mouth daily.   hydrochlorothiazide 12.5 MG capsule Commonly known as:  MICROZIDE Take 1 capsule (12.5 mg total) by mouth daily. Please keep upcoming appt in October with [name] for future refills. Thank you   HYOSCYAMINE PO Take by mouth.   lansoprazole 30 MG capsule Commonly known as:  PREVACID Take 30 mg by mouth daily as needed (for ulcer/acid reflex). Ulcer/ acid reflux   metoprolol succinate 100 MG 24 hr tablet Commonly known as:  TOPROL-XL Take 1  & 1/2 (one & one-half) tablets by mouth daily. Please keep upcoming appt in October with [name] for future refills. Thank you   mupirocin ointment 2 % Commonly known as:  [employer]  twice a day to right arm wound   nitroGLYCERIN 0.4 MG SL tablet Commonly known as:  NITROSTAT Place 1 tablet (0.4 mg total) under the tongue every 5 (five) minutes as needed for chest pain.   simvastatin 10 MG tablet Commonly known as:  ZOCOR Take 10 mg by mouth every other day.   VITAMIN C PO Take 2 tablets by mouth daily.   vitamin E 400 UNIT capsule Take 400 Units by mouth daily.       Allergies:  Allergies  Allergen Reactions  . Diphenhydramine Hcl Hives    Other reaction(s): Hives  . Bactrim [Sulfamethoxazole-Trimethoprim] Nausea And Vomiting  . Sulfamethoxazole-Trimethoprim Nausea And Vomiting    Other reaction(s): Nausea And Vomiting    Past Medical History, Surgical  history, Social history, and Family History were reviewed and updated.  Review of Systems: Review of Systems  Constitutional: Positive for malaise/fatigue.  HENT: Negative.   Eyes: Negative.   Respiratory: Negative.   Cardiovascular: Negative.   Gastrointestinal: Positive for abdominal pain, diarrhea and nausea.  Genitourinary: Negative.   Musculoskeletal: Negative.   Skin: Negative.   Neurological: Negative.   Endo/Heme/Allergies: Negative.   Psychiatric/Behavioral: Negative.      Physical Exam:  weight is 114 lb (51.7 kg). Her oral temperature is 98.3 F (36.8 C). Her blood pressure is 192/68 (abnormal) and her pulse is 61. Her respiration is 18 and oxygen saturation is 100%.   Wt Readings from Last 3 Encounters:  10/15/17 114 lb (51.7 kg)  06/14/17 116 lb (52.6 kg)  05/22/17 114 lb (51.7 kg)    Physical Exam  Constitutional: She is oriented to person, place, and time.  HENT:  Head: Normocephalic and atraumatic.  Mouth/Throat: Oropharynx is clear and moist.  Eyes: Pupils are equal, round, and reactive to light. EOM are normal.  Neck: Normal range of motion.  Cardiovascular: Normal rate, regular rhythm and normal heart sounds.  Pulmonary/Chest: Effort normal and breath sounds normal.  Abdominal: Soft. Bowel sounds are normal.  Musculoskeletal: Normal range of motion. She exhibits no edema, tenderness or deformity.  Lymphadenopathy:    She has no cervical adenopathy.  Neurological: She is alert and oriented to person, place, and time.  Skin: Skin is warm and dry. No rash noted. No erythema.  Psychiatric: She has a normal mood and affect. Her behavior is normal. Judgment and thought content normal.  Vitals reviewed.   Lab Results  Component Value Date   WBC 7.2 10/15/2017   HGB 15.3 10/15/2017   HCT 47.2 (H) 10/15/2017   MCV 94.6 10/15/2017   PLT 259 10/15/2017   Lab Results  Component Value Date   FERRITIN 30 06/14/2017   IRON 87 06/14/2017   TIBC 355  06/14/2017   UIBC 268 06/14/2017   IRONPCTSAT 25 06/14/2017   Lab Results  Component Value Date   RETICCTPCT 1.2 08/03/2010   RBC 4.99 10/15/2017   RETICCTABS 61.3 08/03/2010   No results found for: KPAFRELGTCHN, LAMBDASER, KAPLAMBRATIO No results found for: IGGSERUM, IGA, IGMSERUM No results found for: Ronnald Ramp, A1GS, A2GS, Tillman Sers, SPEI   Chemistry      Component Value Date/Time   NA 136 06/14/2017 1506   NA 139 01/03/2017 1327   NA 136 12/28/2015 1311   K 3.7 06/14/2017 1506   K 3.7 01/03/2017 1327   K 4.9 12/28/2015 1311   CL 97 (L) 06/14/2017 1506   CL 94 (L) 01/03/2017 1327   CO2 32 06/14/2017 1506  CO2 31 01/03/2017 1327   CO2 28 12/28/2015 1311   BUN 19 06/14/2017 1506   BUN 12 01/03/2017 1327   BUN 10.4 12/28/2015 1311   CREATININE 0.90 06/14/2017 1506   CREATININE 1.0 01/03/2017 1327   CREATININE 0.9 12/28/2015 1311      Component Value Date/Time   CALCIUM 9.2 06/14/2017 1506   CALCIUM 8.8 01/03/2017 1327   CALCIUM 9.6 12/28/2015 1311   ALKPHOS 88 (H) 06/14/2017 1506   ALKPHOS 85 (H) 01/03/2017 1327   ALKPHOS 83 12/28/2015 1311   AST 25 06/14/2017 1506   AST 25 12/28/2015 1311   ALT 20 06/14/2017 1506   ALT 19 01/03/2017 1327   ALT 13 12/28/2015 1311   BILITOT 0.8 06/14/2017 1506   BILITOT 0.74 12/28/2015 1311     Impression and Plan: Ms. Meixner is a 67 yo white female with polycythemia.   We will set her up with a phlebotomy.  She wants to have this done at the main cancer center.  This is no problem.  I do think that her ultimate problem will be smoking.  I think this will lead to other issues.  She knows this however.  I like to see her back in around 2-3 months.  I want to make sure everything is doing okay prior to the holiday season.    [name], MD 8/26/20192:02 PM

## 2017-10-16 LAB — FERRITIN: FERRITIN: 24 ng/mL (ref 11–307)

## 2017-10-16 LAB — IRON AND TIBC
Iron: 72 ug/dL (ref 41–142)
SATURATION RATIOS: 16 % — AB (ref 21–57)
TIBC: 463 ug/dL — ABNORMAL HIGH (ref 236–444)
UIBC: 390 ug/dL

## 2017-10-19 ENCOUNTER — Inpatient Hospital Stay: Payer: PPO

## 2017-10-19 NOTE — Progress Notes (Signed)
Junious Dresser, RN initiated 18g IV to the R AC. 515 grams phlebotomized over 10 minutes. Pt tolerated procedure well. Snack and drink provided. Pt post-phlebotomy BP = 199/68. Pt stated that she has not taken her BP medication today. This RN spoke with Dr. Marin Olp. Per MD, ok to d/c to home and instruct pt to take BP meds. Also informed pt to set up an appt with heart doctor if necessary. Pt voiced understanding. Pt discharged to home in stable condition post 30 minute observation.

## 2017-10-28 ENCOUNTER — Other Ambulatory Visit: Payer: Self-pay

## 2017-10-28 ENCOUNTER — Emergency Department (HOSPITAL_COMMUNITY): Payer: PPO

## 2017-10-28 ENCOUNTER — Encounter (HOSPITAL_COMMUNITY): Payer: Self-pay

## 2017-10-28 ENCOUNTER — Emergency Department (HOSPITAL_COMMUNITY)
Admission: EM | Admit: 2017-10-28 | Discharge: 2017-10-29 | Disposition: A | Discharge status: Home / Self Care | Payer: PPO | Attending: Emergency Medicine | Admitting: Emergency Medicine

## 2017-10-28 DIAGNOSIS — R0602 Shortness of breath: Secondary | ICD-10-CM | POA: Diagnosis not present

## 2017-10-28 DIAGNOSIS — I208 Other forms of angina pectoris: Secondary | ICD-10-CM | POA: Diagnosis not present

## 2017-10-28 DIAGNOSIS — J449 Chronic obstructive pulmonary disease, unspecified: Secondary | ICD-10-CM | POA: Diagnosis not present

## 2017-10-28 DIAGNOSIS — Z7902 Long term (current) use of antithrombotics/antiplatelets: Secondary | ICD-10-CM | POA: Diagnosis not present

## 2017-10-28 DIAGNOSIS — R079 Chest pain, unspecified: Secondary | ICD-10-CM | POA: Diagnosis not present

## 2017-10-28 DIAGNOSIS — I10 Essential (primary) hypertension: Secondary | ICD-10-CM | POA: Insufficient documentation

## 2017-10-28 DIAGNOSIS — Z79899 Other long term (current) drug therapy: Secondary | ICD-10-CM | POA: Diagnosis not present

## 2017-10-28 DIAGNOSIS — Z87891 Personal history of nicotine dependence: Secondary | ICD-10-CM | POA: Diagnosis not present

## 2017-10-28 DIAGNOSIS — R0789 Other chest pain: Secondary | ICD-10-CM | POA: Diagnosis present

## 2017-10-28 DIAGNOSIS — E871 Hypo-osmolality and hyponatremia: Secondary | ICD-10-CM | POA: Insufficient documentation

## 2017-10-28 LAB — I-STAT TROPONIN, ED: Troponin i, poc: 0.03 ng/mL (ref 0.00–0.08)

## 2017-10-28 NOTE — ED Triage Notes (Signed)
Pt states that over the past several days she has been having R sided CP, radiation to back and neck, with dizziness, SOB, denies n/v, hx of stent placement, PTA pt took one nitro

## 2017-10-28 NOTE — ED Provider Notes (Signed)
[facility] EMERGENCY DEPARTMENT Provider Note  CSN: [id number] Arrival date & time: 10/28/17 2213  Chief Complaint(s) Chest Pain  HPI [name] is a 67 y.o. female with a past medical history of hypertension, CAD status post stenting, COPD who presents to the emergency department with several days of exertional chest, bilateral upper, and bilateral lower extremity heaviness similar to prior anginal symptoms requiring stenting.  Patient is also endorsing exertional shortness of breath.  Both the symptoms resolve with rest.  Last time she felt the chest and arm heaviness was approximately 1500 this afternoon.  She reports that around 1900 p.m., she took a nitroglycerin due to lower extremity heaviness.  After this she developed neck and back pain which lasted for several hours and has not resolved.  She is currently asymptomatic.  She denies any associated fevers or chills, coughing, congestion, nausea, vomiting, abdominal pain.  HPI  Past Medical History Past Medical History:  Diagnosis Date  . Anxiety   . Arthritis   . CAD (coronary artery disease)   . COPD (chronic obstructive pulmonary disease) ([place])   . Depression   . Dyspnea   . GERD (gastroesophageal reflux disease)   . Hepatitis C   . Hypertension   . Peripheral vascular disease ([place])   . Polycythemia 2009  . Seizures ([place]) 1970   x1- pt. reports that there was never any causative agent     Patient Active Problem List   Diagnosis Date Noted  . Colitis 03/21/2017  . Colitis with rectal bleeding 03/21/2017  . Polycythemia vera ([place]) 03/21/2017  . Asymptomatic stenosis of left carotid artery 07/24/2016  . Tobacco abuse 03/09/2015  . Influenza 03/02/2012  . COPD exacerbation ([place]) 03/02/2012  . Hypoxia 03/02/2012  . Hematochezia 10/30/2011  . Coronary artery disease involving native coronary artery of native heart with angina pectoris ([place])   . GERD (gastroesophageal reflux disease)   .  Polycythemia   . Irritable bowel syndrome 08/15/2010  . HERPES ZOSTER 10/07/2009  . LIPOMA 10/07/2009  . 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 UNSPECIFIED VITAMIN D DEFICIENCY 01/06/2009  . ESOPHAGEAL REFLUX 06/04/2008  . ANXIETY DEPRESSION 01/24/2007  . OSTEOARTHRITIS, GENERALIZED, MULTIPLE JOINTS 11/21/2006  . Acute hepatitis C virus infection 10/23/2006  . Essential hypertension 10/23/2006   Home Medication(s) Prior to Admission medications   Medication Sig Start Date End Date Taking? Authorizing Provider  acetaminophen (TYLENOL) 500 MG tablet Take 500-1,000 mg by mouth every 6 (six) hours as needed for mild pain or headache (depends on pain if takes 1-2 tablets).     [provider]  albuterol (PROVENTIL HFA;VENTOLIN HFA) 108 (90 BASE) MCG/ACT inhaler Inhale 2 puffs into the lungs every 6 (six) hours as needed for wheezing or shortness of breath. 02/21/14   Gregor Hams, MD  ALPRAZolam Duanne Moron) 0.5 MG tablet Take 0.5 mg by mouth at bedtime.  11/27/12   [provider]  Ascorbic Acid (VITAMIN C PO) Take 2 tablets by mouth daily.    [provider]  Biotin 1000 MCG tablet Take 1,000 mcg by mouth daily.    [provider]  clopidogrel (PLAVIX) 75 MG tablet Take 1 tablet (75 mg total) by mouth daily. Please keep upcoming appointment for additional refills thanks 10/02/17   Belva Crome, MD  clotrimazole-betamethasone (LOTRISONE) cream use 1 application to affected area as needed for sore areas on body 12/12/12   Marletta Lor, MD  escitalopram (LEXAPRO) 20 MG tablet Take 20 mg by mouth  daily. 03/19/17   [provider]  hydrochlorothiazide (MICROZIDE) 12.5 MG capsule Take 1 capsule (12.5 mg total) by mouth daily. Please keep upcoming appt in October with [name] for future refills. Thank you 09/19/17   Belva Crome, MD  Hyoscyamine Sulfate (HYOSCYAMINE PO) Take by mouth.    [provider]  lansoprazole (PREVACID) 30 MG capsule  Take 30 mg by mouth daily as needed (for ulcer/acid reflex). Ulcer/ acid reflux    [provider]  metoprolol succinate (TOPROL-XL) 100 MG 24 hr tablet Take 1  & 1/2 (one & one-half) tablets by mouth daily. Please keep upcoming appt in October with [name] for future refills. Thank you 09/19/17   Belva Crome, MD  mupirocin ointment (BACTROBAN) 2 % Apply twice a day to right arm wound Patient taking differently: Apply 1 application topically as needed.  06/28/16   [name], MD  nitroGLYCERIN (NITROSTAT) 0.4 MG SL tablet Place 1 tablet (0.4 mg total) under the tongue every 5 (five) minutes as needed for chest pain. 04/30/17   Belva Crome, MD  simvastatin (ZOCOR) 10 MG tablet Take 10 mg by mouth every other day.  06/12/14   [provider]  telmisartan (MICARDIS) 20 MG tablet Take 1 tablet (20 mg total) by mouth daily. 10/15/17   [name], MD  vitamin E 400 UNIT capsule Take 400 Units by mouth daily.    [provider]                                                                                                                                    Past Surgical History Past Surgical History:  Procedure Laterality Date  . 2 stints  Aug 26,2010   Dr. Tawnya Crook  . ABDOMINAL SURGERY  1970's   for excessive bleeding from loss of pregnancy, required blood transfusion post procedure   . cartoid endartherectomy  2007  . CORONARY ANGIOPLASTY WITH STENT PLACEMENT  10/09/2008  . ENDARTERECTOMY Left 07/24/2016   Procedure: ENDARTERECTOMY CAROTID;  Surgeon: [name], MD;  Location: [place];  Service: Vascular;  Laterality: Left;  . FLEXIBLE SIGMOIDOSCOPY N/A 03/22/2017   Procedure: FLEXIBLE SIGMOIDOSCOPY;  Surgeon: Carol Ada, MD;  Location: [facility];  Service: Endoscopy;  Laterality: N/A;  . LYMPH NODE DISSECTION Left 1990's   benign   . TUBAL LIGATION     Family History Family History  Problem Relation Age of Onset  . Heart disease Mother   . Heart  disease Father     Social History Social History   Tobacco Use  . Smoking status: Former Smoker    Packs/day: 0.25    Years: 48.00    Pack years: 12.00    Types: Cigarettes    Start date: 03/20/1968  . Smokeless tobacco: Never Used  . Tobacco comment: "1 pk lasts about 3 days", uses VAPE, uses nicorette patch   Substance  Use Topics  . Alcohol use: Yes    Alcohol/week: 0.0 standard drinks    Comment: social- 2-3 x's per month   . Drug use: No    Comment: has smoked cigarettes off and on   Allergies Diphenhydramine hcl; Bactrim [sulfamethoxazole-trimethoprim]; and Sulfamethoxazole-trimethoprim  Review of Systems Review of Systems All other systems are reviewed and are negative for acute change except as noted in the HPI  Physical Exam Vital Signs  I have reviewed the triage vital signs BP (!) 132/53   Pulse 67   Temp 98.7 F (37.1 C) (Oral)   Resp 20   SpO2 98%   Physical Exam  Constitutional: She is oriented to person, place, and time. She appears well-developed and well-nourished. No distress.  HENT:  Head: Normocephalic and atraumatic.  Nose: Nose normal.  Eyes: Pupils are equal, round, and reactive to light. Conjunctivae and EOM are normal. Right eye exhibits no discharge. Left eye exhibits no discharge. No scleral icterus.  Neck: Normal range of motion. Neck supple.  Cardiovascular: Normal rate and regular rhythm. Exam reveals no gallop and no friction rub.  No murmur heard. Pulmonary/Chest: Effort normal and breath sounds normal. No stridor. No respiratory distress. She has no rales.  Abdominal: Soft. She exhibits no distension. There is no tenderness.  Musculoskeletal: She exhibits no edema or tenderness.  Neurological: She is alert and oriented to person, place, and time.  Skin: Skin is warm and dry. No rash noted. She is not diaphoretic. No erythema.  Psychiatric: She has a normal mood and affect.  Vitals reviewed.   ED Results and Treatments Labs (all  labs ordered are listed, but only abnormal results are displayed) Labs Reviewed  BASIC METABOLIC PANEL - Abnormal; Notable for the following components:      Result Value   Sodium 129 (*)    Chloride 93 (*)    All other components within normal limits  CBC  I-STAT TROPONIN, ED                                                                                                                         EKG  EKG Interpretation  Date/Time:  Sunday October 28 2017 23:33:46 EDT Ventricular Rate:  61 PR Interval:    QRS Duration: 94 QT Interval:  475 QTC Calculation: 479 R Axis:   82 Text Interpretation:  Sinus rhythm Borderline right axis deviation NO STEMI Otherwise no significant change Confirmed by Addison Lank [phone]) on 10/29/2017 12:23:44 AM      Radiology Dg Chest 2 View  Result Date: 10/28/2017 CLINICAL DATA:  RIGHT side chest pain for several days, dizziness, shortness of breath, history polycythemia, COPD, coronary artery disease, hypertension EXAM: CHEST - 2 VIEW COMPARISON:  12/02/2015 Correlation: Interval CT chest 01/09/2017 FINDINGS: Normal heart size, mediastinal contours, and pulmonary vascularity. Coronary arterial stents noted. Atherosclerotic calcifications aorta. Mild chronic peribronchial thickening without acute infiltrate, pleural effusion or pneumothorax. Bones appear demineralized. IMPRESSION: Chronic bronchitic changes without infiltrate.  Electronically Signed   By: Lavonia Dana M.D.   On: 10/28/2017 22:47   Pertinent labs & imaging results that were available during my care of the patient were reviewed by me and considered in my medical decision making (see chart for details).  Medications Ordered in ED Medications - No data to display                                                                                                                                  Procedures Procedures  (including critical care time)  Medical Decision Making / ED Course I have  reviewed the nursing notes for this encounter and the patient's prior records (if available in EHR or on provided paperwork).    Patient symptoms are suspicious for angina.  EKG without acute ischemic changes or evidence of pericarditis.  Initial troponin negative.  She is currently chest pain-free and feels that a delta troponin should be sufficient to rule out ACS at this time however patient did not want to stay around for additional troponin.  Patient reported that she was going to call her cardiologist in the morning for close follow-up.  Chest x-ray without evidence suggestive of pneumonia, pneumothorax, pneumomediastinum.  No abnormal contour of the mediastinum to suggest dissection. No evidence of acute injuries.  No evidence of volume overload on exam.  Presentation not classic for aortic dissection or esophageal perforation.  Low suspicion for pulmonary embolism.  BMP was notable for mild hyponatremia which patient has had in the past.  Instructed to increase her sodium intake and follow-up with her primary care provider.  CBC clotted the patient refused additional blood work.   Final Clinical Impression(s) / ED Diagnoses Final diagnoses:  Angina of effort ([place])   Disposition: AMA  Condition: stable  I have discussed the results, Dx and Tx plan with the patient who expressed understanding and agree(s) with the plan. Discharge instructions discussed at great length. The patient was given strict return precautions who verbalized understanding of the instructions. No further questions at time of discharge.   Date: 10/29/2017 Patient: Kassie Keng or her authorized caregiver has made the decision for the patient to leave the emergency department against the advice of No att. providers found. She or her authorized caregiver has been informed about any test results if available and understands the inherent risks of leaving, including permanent disability or  death. She or her authorized caregiver has decided to accept the responsibility for this decision. She or her authorized caregiver is not intoxicated and has demonstrated decision making capacity. Beather Arbour and all necessary parties have been advised that she may return for further evaluation or treatment at any time and they are recommended alternative follow up if continually unwilling. Her condition at time of discharge was Stable.  Vital signs Blood pressure 110/66, pulse (!) 58, temperature 98.7 F (37.1 C), temperature source Oral, resp. rate  15, SpO2 96 %.       ED Discharge Orders    None       Follow Up: Wenda Low, MD [place] [employer] [address] [place] [address] [phone]  Schedule an appointment as soon as possible for a visit    Cardiologist  Schedule an appointment as soon as possible for a visit  as soon as possible, for close follow up to assess for increased anginal symptoms      This chart was dictated using voice recognition software.  Despite best efforts to proofread,  errors can occur which can change the documentation meaning.   Fatima Blank, MD 10/29/17 [phone]

## 2017-10-29 ENCOUNTER — Telehealth: Payer: Self-pay | Admitting: Interventional Cardiology

## 2017-10-29 ENCOUNTER — Encounter: Payer: Self-pay | Admitting: Cardiology

## 2017-10-29 LAB — BASIC METABOLIC PANEL
Anion gap: 12 (ref 5–15)
BUN: 11 mg/dL (ref 8–23)
CALCIUM: 8.9 mg/dL (ref 8.9–10.3)
CHLORIDE: 93 mmol/L — AB (ref 98–111)
CO2: 24 mmol/L (ref 22–32)
CREATININE: 0.8 mg/dL (ref 0.44–1.00)
GFR calc non Af Amer: >60 mL/min (ref >60)
Glucose, Bld: 74 mg/dL (ref 70–99)
Potassium: 3.6 mmol/L (ref 3.5–5.1)
SODIUM: 129 mmol/L — AB (ref 135–145)

## 2017-10-29 NOTE — Telephone Encounter (Signed)
° °  Routed to nurse in error. Resent to triage

## 2017-10-29 NOTE — Telephone Encounter (Signed)
° ° °  Patient seen in ED yesterday,left without before completion of treatment. However patient is  requesting appointment today. Offered appointment with APP for 9/10; patient declined. States she needs to be seen today.  Please advise

## 2017-10-29 NOTE — ED Notes (Signed)
Conversation had with family at length regarding leaving against medical advice. Pt and family verbalized understanding regarding potential risk of injury/death d/t incomplete medical assessment and care.  Pt ambulatory to waiting room with family.

## 2017-10-29 NOTE — Telephone Encounter (Signed)
Pt calling today to follow up from her recent ED visit which she left AMA. Pt has had recent bouts of angina which she treated with Nitro. Pt went to the ED yesterday for chest, neck and back pain. Pt's labs came back and she was hyponatremic and refused IV fluid. I have advised pt to come to the office on 9/10 with Pecolia Ades for further evaluation. Dr Tamala Julian will be in office at this time as well. Pt has agreed with plan and will be here in the morning.

## 2017-10-29 NOTE — ED Notes (Signed)
Patient refused IV start by nurse , stated "I'm going home".

## 2017-10-30 ENCOUNTER — Ambulatory Visit (INDEPENDENT_AMBULATORY_CARE_PROVIDER_SITE_OTHER): Payer: PPO | Admitting: Cardiology

## 2017-10-30 ENCOUNTER — Encounter: Payer: Self-pay | Admitting: Cardiology

## 2017-10-30 ENCOUNTER — Other Ambulatory Visit: Payer: Self-pay

## 2017-10-30 DIAGNOSIS — R079 Chest pain, unspecified: Secondary | ICD-10-CM | POA: Diagnosis not present

## 2017-10-30 DIAGNOSIS — Z720 Tobacco use: Secondary | ICD-10-CM

## 2017-10-30 DIAGNOSIS — I25119 Atherosclerotic heart disease of native coronary artery with unspecified angina pectoris: Secondary | ICD-10-CM

## 2017-10-30 DIAGNOSIS — I10 Essential (primary) hypertension: Secondary | ICD-10-CM

## 2017-10-30 DIAGNOSIS — E785 Hyperlipidemia, unspecified: Secondary | ICD-10-CM | POA: Diagnosis not present

## 2017-10-30 DIAGNOSIS — D45 Polycythemia vera: Secondary | ICD-10-CM | POA: Diagnosis not present

## 2017-10-30 LAB — LIPID PANEL
CHOL/HDL RATIO: 2.7 ratio (ref 0.0–4.4)
Cholesterol, Total: 176 mg/dL (ref 100–199)
HDL: 65 mg/dL (ref >39)
LDL Calculated: 87 mg/dL (ref 0–99)
Triglycerides: 120 mg/dL (ref 0–149)
VLDL Cholesterol Cal: 24 mg/dL (ref 5–40)

## 2017-10-30 LAB — BASIC METABOLIC PANEL
BUN / CREAT RATIO: 17 (ref 12–28)
BUN: 11 mg/dL (ref 8–27)
CALCIUM: 9.1 mg/dL (ref 8.7–10.3)
CO2: 25 mmol/L (ref 20–29)
Chloride: 91 mmol/L — ABNORMAL LOW (ref 96–106)
Creatinine, Ser: 0.63 mg/dL (ref 0.57–1.00)
GFR, EST AFRICAN AMERICAN: 107 mL/min/{1.73_m2} (ref >59)
GFR, EST NON AFRICAN AMERICAN: 93 mL/min/{1.73_m2} (ref >59)
Glucose: 109 mg/dL — ABNORMAL HIGH (ref 65–99)
POTASSIUM: 3.9 mmol/L (ref 3.5–5.2)
Sodium: 131 mmol/L — ABNORMAL LOW (ref 134–144)

## 2017-10-30 MED ORDER — NITROGLYCERIN 0.4 MG SL SUBL: 0.4000 mg | SUBLINGUAL_TABLET | SUBLINGUAL | 3 refills | Status: DC | PRN

## 2017-10-30 NOTE — Progress Notes (Signed)
Cardiology Office Note:    Date:  10/30/2017   ID:  [name], DOB 1950/12/13, MRN [id number]  PCP:  Wenda Low, MD  Cardiologist:  Sinclair Grooms, MD  Referring MD: Wenda Low, MD   Chief Complaint  Patient presents with  . Chest Pain    History of Present Illness:    [name] is a 67 y.o. female with a past medical history significant for hypertension, CAD s/p stent 2010, left carotid endarterectomy 07/2016, right carotid endarterectomy 2007, polycythemia vera, tobacco abuse, COPD, GERD and anxiety.  Polycythemia vera and hepatitis C followed by Dr. Marin Olp, hematology with intermittent phlebotomy.  She was last seen in our office on 05/15/16 by [name] for cardiac clearance prior to her carotid endarterectomy.  At that time her blood pressure was too high was advised to increase metoprolol to 150 mg daily.  Ms Wunder went to the emergency department on 10/28/2017 for complaints of bouts of angina which she treated with nitro.  She was found to have hyponatremia, Na+ 129, but refused IV fluids and ended up leaving AMA.  Troponin was negative.  EKG was without ischemic changes.  Chest x-ray showed chronic bronchitic changes without infiltrate.  She is here today for further evaluation.  Lexiscan Myoview 03/22/2015 was low risk with mildly reduced EF of 49%.  She had sharp chest pain lasting a few seconds about 2 weeks ago that she took 2 NTG for. Since then she has not had that sharp pain in the chest, but she has felt fatigued and drained. She has not had the energy to clean her house. On 10/28/17 she developed chest heaviness and also heaviness in the legs. She also had pain across the posterior shoulders and neck which she feels may be related to weedeating and yard work. Yesterday she relaxed and went to the movies. She was worn out walking into the theatre. She also had cramping "drawing" of her neck one night last week. No orthopnea, PND or edema.    Past  Medical History:  Diagnosis Date  . Anxiety   . Arthritis   . CAD (coronary artery disease)   . COPD (chronic obstructive pulmonary disease) ([place])   . Depression   . Dyspnea   . GERD (gastroesophageal reflux disease)   . Hepatitis C   . Hypertension   . Peripheral vascular disease ([place])   . Polycythemia 2009  . Seizures ([place]) 1970   x1- pt. reports that there was never any causative agent      Past Surgical History:  Procedure Laterality Date  . 2 stints  Aug 26,2010   Dr. Tawnya Crook  . ABDOMINAL SURGERY  1970's   for excessive bleeding from loss of pregnancy, required blood transfusion post procedure   . cartoid endartherectomy  2007  . CORONARY ANGIOPLASTY WITH STENT PLACEMENT  10/09/2008  . ENDARTERECTOMY Left 07/24/2016   Procedure: ENDARTERECTOMY CAROTID;  Surgeon: [name], MD;  Location: [facility];  Service: Vascular;  Laterality: Left;  . FLEXIBLE SIGMOIDOSCOPY N/A 03/22/2017   Procedure: FLEXIBLE SIGMOIDOSCOPY;  Surgeon: Carol Ada, MD;  Location: [facility];  Service: Endoscopy;  Laterality: N/A;  . LYMPH NODE DISSECTION Left 1990's   benign   . TUBAL LIGATION      Current Medications: Current Meds  Medication Sig  . acetaminophen (TYLENOL) 500 MG tablet Take 500-1,000 mg by mouth every 6 (six) hours as needed for mild pain or headache (depends on pain if takes 1-2 tablets).   Marland Kitchen  albuterol (PROVENTIL HFA;VENTOLIN HFA) 108 (90 BASE) MCG/ACT inhaler Inhale 2 puffs into the lungs every 6 (six) hours as needed for wheezing or shortness of breath.  . ALPRAZolam (XANAX) 0.5 MG tablet Take 0.5 mg by mouth at bedtime.   . Ascorbic Acid (VITAMIN C PO) Take 2 tablets by mouth daily.  . Biotin 1000 MCG tablet Take 1,000 mcg by mouth daily.  . clopidogrel (PLAVIX) 75 MG tablet Take 1 tablet (75 mg total) by mouth daily. Please keep upcoming appointment for additional refills thanks  . clotrimazole-betamethasone (LOTRISONE) cream use 1 application to affected area as needed for  sore areas on body  . escitalopram (LEXAPRO) 20 MG tablet Take 20 mg by mouth daily.  . hydrochlorothiazide (MICROZIDE) 12.5 MG capsule Take 1 capsule (12.5 mg total) by mouth daily. Please keep upcoming appt in October with [name] for future refills. Thank you  . Hyoscyamine Sulfate (HYOSCYAMINE PO) Take by mouth.  . lansoprazole (PREVACID) 30 MG capsule Take 30 mg by mouth daily as needed (for ulcer/acid reflex). Ulcer/ acid reflux  . metoprolol succinate (TOPROL-XL) 100 MG 24 hr tablet Take 1  & 1/2 (one & one-half) tablets by mouth daily. Please keep upcoming appt in October with [name] for future refills. Thank you  . mupirocin ointment (BACTROBAN) 2 % Apply twice a day to right arm wound (Patient taking differently: Apply 1 application topically as needed. )  . nitroGLYCERIN (NITROSTAT) 0.4 MG SL tablet Place 1 tablet (0.4 mg total) under the tongue every 5 (five) minutes as needed for chest pain.  . simvastatin (ZOCOR) 10 MG tablet Take 10 mg by mouth every other day.   . vitamin E 400 UNIT capsule Take 400 Units by mouth daily.     Allergies:   Diphenhydramine hcl; Bactrim [sulfamethoxazole-trimethoprim]; and Sulfamethoxazole-trimethoprim   Social History   Socioeconomic History  . Marital status: Single    Spouse name: Not on file  . Number of children: Not on file  . Years of education: Not on file  . Highest education level: Not on file  Occupational History  . Not on file  Social Needs  . Financial resource strain: Not on file  . Food insecurity:    Worry: Not on file    Inability: Not on file  . Transportation needs:    Medical: Not on file    Non-medical: Not on file  Tobacco Use  . Smoking status: Former Smoker    Packs/day: 0.25    Years: 48.00    Pack years: 12.00    Types: Cigarettes    Start date: 03/20/1968  . Smokeless tobacco: Never Used  . Tobacco comment: "1 pk lasts about 3 days", uses VAPE, uses nicorette patch   Substance and Sexual Activity    . Alcohol use: Yes    Alcohol/week: 0.0 standard drinks    Comment: social- 2-3 x's per month   . Drug use: No    Comment: has smoked cigarettes off and on  . Sexual activity: Not Currently  Lifestyle  . Physical activity:    Days per week: Not on file    Minutes per session: Not on file  . Stress: Not on file  Relationships  . Social connections:    Talks on phone: Not on file    Gets together: Not on file    Attends religious service: Not on file    Active member of club or organization: Not on file    Attends meetings of  clubs or organizations: Not on file    Relationship status: Not on file  Other Topics Concern  . Not on file  Social History Narrative   Tobacco use cigarettes: Current smoker   Frequency 1 PPD   Estimated Pack - years :30   Smoking : yes    No Tobacco exposure   No alcohol   No exercise   Occupation : employed, works at home for [employer], Press photographer   Children Boys 1 Girls 1     Family History: The patient's family history includes Heart disease in her father and mother. ROS:   Please see the history of present illness.     All other systems reviewed and are negative.  EKGs/Labs/Other Studies Reviewed:    The following studies were reviewed today:  Carotid artery ultrasound 05/22/2017 Final Interpretation: Right Carotid: Patent right carotid endarterectomy site with velocity in the        right ICA consistent with a 1-39% stenosis.  Left Carotid: Patent left carotid endarterectomy site with velocitity in the       left ICA consistent with a 1-39% stenosis.  Vertebrals: Bilateral vertebral arteries demonstrate antegrade flow.  Carlton Adam Myoview 03/22/2015 Study Highlights   Nuclear stress EF: 49%.  There was no ST segment deviation noted during stress.  The study is normal.  This is a low risk study.  The left ventricular ejection fraction is mildly decreased (45-54%).       EKG:  EKG is not ordered today.  The ekg from 10/28/17 is reviewed Sinus rhythm Borderline right axis deviation. No acute ischemia.   Recent Labs: 01/03/2017: TSH 3.731 10/15/2017: ALT 16; Hemoglobin 15.3; Platelet Count 259 10/28/2017: BUN 11; Creatinine, Ser 0.80; Potassium 3.6; Sodium 129   Recent Lipid Panel    Component Value Date/Time   CHOL 202 (H) 09/16/2014 1322   TRIG 184 (H) 09/16/2014 1322   HDL 40 (L) 09/16/2014 1322   CHOLHDL 5.1 (H) 09/16/2014 1322   VLDL 37 (H) 09/16/2014 1322   LDLCALC 125 09/16/2014 1322    Physical Exam:    VS:  BP (!) 160/70   Pulse 65   Ht 5' (1.524 m)   Wt 116 lb 12.8 oz (53 kg)   SpO2 99%   BMI 22.81 kg/m     Wt Readings from Last 3 Encounters:  10/30/17 116 lb 12.8 oz (53 kg)  10/15/17 114 lb (51.7 kg)  06/14/17 116 lb (52.6 kg)     Physical Exam  Constitutional: She is oriented to person, place, and time. She appears well-developed and well-nourished. No distress.  HENT:  Head: Normocephalic and atraumatic.  Neck: Normal range of motion. Neck supple. No JVD present.  Cardiovascular: Normal rate, regular rhythm and normal heart sounds. Exam reveals no gallop and no friction rub.  No murmur heard. 1+ pedal pulses bilat  Pulmonary/Chest: Effort normal and breath sounds normal. No respiratory distress. She has no wheezes. She has no rales.  Abdominal: Soft. Bowel sounds are normal.  Musculoskeletal: Normal range of motion. She exhibits no edema or deformity.  Neurological: She is alert and oriented to person, place, and time.  Skin: Skin is warm and dry.  Psychiatric: She has a normal mood and affect. Her behavior is normal. Judgment and thought content normal.  Vitals reviewed.   ASSESSMENT:    1. Coronary artery disease involving native coronary artery of native heart with angina pectoris ([place])   2. Essential hypertension  3. Hyperlipidemia LDL goal <70   4. Polycythemia vera ([place])   5. Tobacco abuse   6. Chest pain,  unspecified type    PLAN:    In order of problems listed above:  Chest pain: Pt with vague complaints and atypical few seconds of sharp pain and some Posterior shoulder and neck discomfort that she relates to weed eating and yard work. She has also had fatigue and heaviness in the legs. No chest pain in the last 2 days. Will check Lexiscan myoview. Pt strongly advised to stop smoking.   CAD: S/P cardiac stents in 2010.She is on Plavix, BB and statin  Hyponatremia: Na+ 129 on 10/28/17. Recheck labs  Hypertension: Toprol-XL 150 mg daily, hydrochlorothiazide 12.5 mg daily   (uncontrolled). Dr. Marin Olp ordered telmasartan but the pt did not start taking it.  Recent labs on 10/28/2017 with normal renal function. Pt instructed to start the ARB for better BP control.   Hyperlipidemia: On simvastatin 10 mg every other day  (last lipid panel 2016). Will check lipids today.   Polycythemia vera: Followed by Dr. Marin Olp for hematology.  Hematocrit 47.2 on 10/15/2017.  Patient had a phlebotomy on 10/19/17.   Tobacco abuse: Continues to smoke 1.5 PPD. She has quit in the past but is back to smoking. We discussed the negative effects on the vascular systom with increased risk for heart attack, stroke and PAD. She expreses how diff  Carotid artery disease S/P left carotid endarterectomy 07/2016, right carotid endarterectomy 2007.  Followed by Dr. Donnetta Hutching of [facility].   Leg heaviness: once stress test done, consider ABI. She has decreased pedal pulses. No disruption in skin. She is already on statin and plavix.    Medication Adjustments/Labs and Tests Ordered: Current medicines are reviewed at length with the patient today.  Concerns regarding medicines are outlined above. Labs and tests ordered and medication changes are outlined in the patient instructions below:  Patient Instructions  Medication Instructions: Your physician recommends that you continue on your current medications as directed. Please refer to the  Current Medication list given to you today.   Labwork: TODAY: BMET, LIPIDS  Procedures/Testing: Your physician has requested that you have a lexiscan myoview. For further information please visit [email or web address]. Please follow instruction sheet, as given.    Follow-Up: Please keep follow up appointment with [name] on 12/05/17 @ 4:00 PM  Any Additional Special Instructions Will Be Listed Below (If Applicable).   DASH Eating Plan DASH stands for "Dietary Approaches to Stop Hypertension." The DASH eating plan is a healthy eating plan that has been shown to reduce high blood pressure (hypertension). It may also reduce your risk for type 2 diabetes, heart disease, and stroke. The DASH eating plan may also help with weight loss. What are tips for following this plan? General guidelines  Avoid eating more than 2,300 mg (milligrams) of salt (sodium) a day. If you have hypertension, you may need to reduce your sodium intake to 1,500 mg a day.  Limit alcohol intake to no more than 1 drink a day for nonpregnant women and 2 drinks a day for men. One drink equals 12 oz of beer, 5 oz of wine, or 1 oz of hard liquor.  Work with your health care provider to maintain a healthy body weight or to lose weight. Ask what an ideal weight is for you.  Get at least 30 minutes of exercise that causes your heart to beat faster (aerobic exercise) most days of the week. Activities may  include walking, swimming, or biking.  Work with your health care provider or diet and nutrition specialist (dietitian) to adjust your eating plan to your individual calorie needs. Reading food labels  Check food labels for the amount of sodium per serving. Choose foods with less than 5 percent of the Daily Value of sodium. Generally, foods with less than 300 mg of sodium per serving fit into this eating plan.  To find whole grains, look for the word "whole" as the first word in the ingredient list. Shopping  Buy  products labeled as "low-sodium" or "no salt added."  Buy fresh foods. Avoid canned foods and premade or frozen meals. Cooking  Avoid adding salt when cooking. Use salt-free seasonings or herbs instead of table salt or sea salt. Check with your health care provider or pharmacist before using salt substitutes.  Do not fry foods. Cook foods using healthy methods such as baking, boiling, grilling, and broiling instead.  Cook with heart-healthy oils, such as olive, canola, soybean, or sunflower oil. Meal planning   Eat a balanced diet that includes: ? 5 or more servings of fruits and vegetables each day. At each meal, try to fill half of your plate with fruits and vegetables. ? Up to 6-8 servings of whole grains each day. ? Less than 6 oz of lean meat, poultry, or fish each day. A 3-oz serving of meat is about the same size as a deck of cards. One egg equals 1 oz. ? 2 servings of low-fat dairy each day. ? A serving of nuts, seeds, or beans 5 times each week. ? Heart-healthy fats. Healthy fats called Omega-3 fatty acids are found in foods such as flaxseeds and coldwater fish, like sardines, salmon, and mackerel.  Limit how much you eat of the following: ? Canned or prepackaged foods. ? Food that is high in trans fat, such as fried foods. ? Food that is high in saturated fat, such as fatty meat. ? Sweets, desserts, sugary drinks, and other foods with added sugar. ? Full-fat dairy products.  Do not salt foods before eating.  Try to eat at least 2 vegetarian meals each week.  Eat more home-cooked food and less restaurant, buffet, and fast food.  When eating at a restaurant, ask that your food be prepared with less salt or no salt, if possible. What foods are recommended? The items listed may not be a complete list. Talk with your dietitian about what dietary choices are best for you. Grains Whole-grain or whole-wheat bread. Whole-grain or whole-wheat pasta. Brown rice. [place].  Bulgur. Whole-grain and low-sodium cereals. Pita bread. Low-fat, low-sodium crackers. Whole-wheat flour tortillas. Vegetables Fresh or frozen vegetables (raw, steamed, roasted, or grilled). Low-sodium or reduced-sodium tomato and vegetable juice. Low-sodium or reduced-sodium tomato sauce and tomato paste. Low-sodium or reduced-sodium canned vegetables. Fruits All fresh, dried, or frozen fruit. Canned fruit in natural juice (without added sugar). Meat and other protein foods Skinless chicken or [place]. Ground chicken or [place]. Pork with fat trimmed off. Fish and seafood. Egg whites. Dried beans, peas, or lentils. Unsalted nuts, nut butters, and seeds. Unsalted canned beans. Lean cuts of beef with fat trimmed off. Low-sodium, lean deli meat. Dairy Low-fat (1%) or fat-free (skim) milk. Fat-free, low-fat, or reduced-fat cheeses. Nonfat, low-sodium ricotta or cottage cheese. Low-fat or nonfat yogurt. Low-fat, low-sodium cheese. Fats and oils Soft margarine without trans fats. Vegetable oil. Low-fat, reduced-fat, or light mayonnaise and salad dressings (reduced-sodium). Canola, safflower, olive, soybean, and sunflower oils. Avocado. Seasoning and  other foods Herbs. Spices. Seasoning mixes without salt. Unsalted popcorn and pretzels. Fat-free sweets. What foods are not recommended? The items listed may not be a complete list. Talk with your dietitian about what dietary choices are best for you. Grains Baked goods made with fat, such as croissants, muffins, or some breads. Dry pasta or rice meal packs. Vegetables Creamed or fried vegetables. Vegetables in a cheese sauce. Regular canned vegetables (not low-sodium or reduced-sodium). Regular canned tomato sauce and paste (not low-sodium or reduced-sodium). Regular tomato and vegetable juice (not low-sodium or reduced-sodium). Angie Fava. Olives. Fruits Canned fruit in a light or heavy syrup. Fried fruit. Fruit in cream or butter sauce. Meat and other  protein foods Fatty cuts of meat. Ribs. Fried meat. Berniece Salines. Sausage. Bologna and other processed lunch meats. Salami. Fatback. Hotdogs. Bratwurst. Salted nuts and seeds. Canned beans with added salt. Canned or smoked fish. Whole eggs or egg yolks. Chicken or [place] with skin. Dairy Whole or 2% milk, cream, and half-and-half. Whole or full-fat cream cheese. Whole-fat or sweetened yogurt. Full-fat cheese. Nondairy creamers. Whipped toppings. Processed cheese and cheese spreads. Fats and oils Butter. Stick margarine. Lard. Shortening. Ghee. Bacon fat. Tropical oils, such as coconut, palm kernel, or palm oil. Seasoning and other foods Salted popcorn and pretzels. Onion salt, garlic salt, seasoned salt, table salt, and sea salt. Worcestershire sauce. Tartar sauce. Barbecue sauce. Teriyaki sauce. Soy sauce, including reduced-sodium. Steak sauce. Canned and packaged gravies. Fish sauce. Oyster sauce. Cocktail sauce. Horseradish that you find on the shelf. Ketchup. Mustard. Meat flavorings and tenderizers. Bouillon cubes. Hot sauce and Tabasco sauce. Premade or packaged marinades. Premade or packaged taco seasonings. Relishes. Regular salad dressings. Where to find more information:  National Heart, Lung, and [place]: https://wilson-eaton.com/  American Heart Association: www.heart.org Summary  The DASH eating plan is a healthy eating plan that has been shown to reduce high blood pressure (hypertension). It may also reduce your risk for type 2 diabetes, heart disease, and stroke.  With the DASH eating plan, you should limit salt (sodium) intake to 2,300 mg a day. If you have hypertension, you may need to reduce your sodium intake to 1,500 mg a day.  When on the DASH eating plan, aim to eat more fresh fruits and vegetables, whole grains, lean proteins, low-fat dairy, and heart-healthy fats.  Work with your health care provider or diet and nutrition specialist (dietitian) to adjust your eating plan to your  individual calorie needs. This information is not intended to replace advice given to you by your health care provider. Make sure you discuss any questions you have with your health care provider. Document Released: 01/26/2011 Document Revised: 01/31/2016 Document Reviewed: 01/31/2016 Elsevier Interactive Patient Education  2018 [employer].    Steps to Quit Smoking Smoking tobacco can be bad for your health. It can also affect almost every organ in your body. Smoking puts you and people around you at risk for many serious long-lasting (chronic) diseases. Quitting smoking is hard, but it is one of the best things that you can do for your health. It is never too late to quit. What are the benefits of quitting smoking? When you quit smoking, you lower your risk for getting serious diseases and conditions. They can include:  Lung cancer or lung disease.  Heart disease.  Stroke.  Heart attack.  Not being able to have children (infertility).  Weak bones (osteoporosis) and broken bones (fractures).  If you have coughing, wheezing, and shortness of breath, those symptoms  may get better when you quit. You may also get sick less often. If you are pregnant, quitting smoking can help to lower your chances of having a baby of low birth weight. What can I do to help me quit smoking? Talk with your doctor about what can help you quit smoking. Some things you can do (strategies) include:  Quitting smoking totally, instead of slowly cutting back how much you smoke over a period of time.  Going to in-person counseling. You are more likely to quit if you go to many counseling sessions.  Using resources and support systems, such as: ? Database administrator with a Social worker. ? Phone quitlines. ? Careers information officer. ? Support groups or group counseling. ? Text messaging programs. ? Mobile phone apps or applications.  Taking medicines. Some of these medicines may have nicotine in them. If you are  pregnant or breastfeeding, do not take any medicines to quit smoking unless your doctor says it is okay. Talk with your doctor about counseling or other things that can help you.  Talk with your doctor about using more than one strategy at the same time, such as taking medicines while you are also going to in-person counseling. This can help make quitting easier. What things can I do to make it easier to quit? Quitting smoking might feel very hard at first, but there is a lot that you can do to make it easier. Take these steps:  Talk to your family and friends. Ask them to support and encourage you.  Call phone quitlines, reach out to support groups, or work with a Social worker.  Ask people who smoke to not smoke around you.  Avoid places that make you want (trigger) to smoke, such as: ? Bars. ? Parties. ? Smoke-break areas at work.  Spend time with people who do not smoke.  Lower the stress in your life. Stress can make you want to smoke. Try these things to help your stress: ? Getting regular exercise. ? Deep-breathing exercises. ? Yoga. ? Meditating. ? Doing a body scan. To do this, close your eyes, focus on one area of your body at a time from head to toe, and notice which parts of your body are tense. Try to relax the muscles in those areas.  Download or buy apps on your mobile phone or tablet that can help you stick to your quit plan. There are many free apps, such as QuitGuide from the [employer] Office manager for Disease Control and Prevention). You can find more support from smokefree.gov and other websites.  This information is not intended to replace advice given to you by your health care provider. Make sure you discuss any questions you have with your health care provider. Document Released: 12/03/2008 Document Revised: 10/05/2015 Document Reviewed: 06/23/2014 Elsevier Interactive Patient Education  [employer].     If you need a refill on your cardiac medications before your  next appointment, please call your pharmacy.      Signed, Daune Perch, NP  10/30/2017 9:49 AM    [facility]

## 2017-10-30 NOTE — Patient Instructions (Addendum)
Medication Instructions: Your physician recommends that you continue on your current medications as directed. Please refer to the Current Medication list given to you today.   Labwork: TODAY: BMET, LIPIDS  Procedures/Testing: Your physician has requested that you have a lexiscan myoview. For further information please visit [email or web address]. Please follow instruction sheet, as given.    Follow-Up: Please keep follow up appointment with [name] on 12/05/17 @ 4:00 PM  Any Additional Special Instructions Will Be Listed Below (If Applicable).   DASH Eating Plan DASH stands for "Dietary Approaches to Stop Hypertension." The DASH eating plan is a healthy eating plan that has been shown to reduce high blood pressure (hypertension). It may also reduce your risk for type 2 diabetes, heart disease, and stroke. The DASH eating plan may also help with weight loss. What are tips for following this plan? General guidelines  Avoid eating more than 2,300 mg (milligrams) of salt (sodium) a day. If you have hypertension, you may need to reduce your sodium intake to 1,500 mg a day.  Limit alcohol intake to no more than 1 drink a day for nonpregnant women and 2 drinks a day for men. One drink equals 12 oz of beer, 5 oz of wine, or 1 oz of hard liquor.  Work with your health care provider to maintain a healthy body weight or to lose weight. Ask what an ideal weight is for you.  Get at least 30 minutes of exercise that causes your heart to beat faster (aerobic exercise) most days of the week. Activities may include walking, swimming, or biking.  Work with your health care provider or diet and nutrition specialist (dietitian) to adjust your eating plan to your individual calorie needs. Reading food labels  Check food labels for the amount of sodium per serving. Choose foods with less than 5 percent of the Daily Value of sodium. Generally, foods with less than 300 mg of sodium per serving fit into  this eating plan.  To find whole grains, look for the word "whole" as the first word in the ingredient list. Shopping  Buy products labeled as "low-sodium" or "no salt added."  Buy fresh foods. Avoid canned foods and premade or frozen meals. Cooking  Avoid adding salt when cooking. Use salt-free seasonings or herbs instead of table salt or sea salt. Check with your health care provider or pharmacist before using salt substitutes.  Do not fry foods. Cook foods using healthy methods such as baking, boiling, grilling, and broiling instead.  Cook with heart-healthy oils, such as olive, canola, soybean, or sunflower oil. Meal planning   Eat a balanced diet that includes: ? 5 or more servings of fruits and vegetables each day. At each meal, try to fill half of your plate with fruits and vegetables. ? Up to 6-8 servings of whole grains each day. ? Less than 6 oz of lean meat, poultry, or fish each day. A 3-oz serving of meat is about the same size as a deck of cards. One egg equals 1 oz. ? 2 servings of low-fat dairy each day. ? A serving of nuts, seeds, or beans 5 times each week. ? Heart-healthy fats. Healthy fats called Omega-3 fatty acids are found in foods such as flaxseeds and coldwater fish, like sardines, salmon, and mackerel.  Limit how much you eat of the following: ? Canned or prepackaged foods. ? Food that is high in trans fat, such as fried foods. ? Food that is high in saturated fat, such as fatty  meat. ? Sweets, desserts, sugary drinks, and other foods with added sugar. ? Full-fat dairy products.  Do not salt foods before eating.  Try to eat at least 2 vegetarian meals each week.  Eat more home-cooked food and less restaurant, buffet, and fast food.  When eating at a restaurant, ask that your food be prepared with less salt or no salt, if possible. What foods are recommended? The items listed may not be a complete list. Talk with your dietitian about what dietary  choices are best for you. Grains Whole-grain or whole-wheat bread. Whole-grain or whole-wheat pasta. Brown rice. [place]. Bulgur. Whole-grain and low-sodium cereals. Pita bread. Low-fat, low-sodium crackers. Whole-wheat flour tortillas. Vegetables Fresh or frozen vegetables (raw, steamed, roasted, or grilled). Low-sodium or reduced-sodium tomato and vegetable juice. Low-sodium or reduced-sodium tomato sauce and tomato paste. Low-sodium or reduced-sodium canned vegetables. Fruits All fresh, dried, or frozen fruit. Canned fruit in natural juice (without added sugar). Meat and other protein foods Skinless chicken or [place]. Ground chicken or [place]. Pork with fat trimmed off. Fish and seafood. Egg whites. Dried beans, peas, or lentils. Unsalted nuts, nut butters, and seeds. Unsalted canned beans. Lean cuts of beef with fat trimmed off. Low-sodium, lean deli meat. Dairy Low-fat (1%) or fat-free (skim) milk. Fat-free, low-fat, or reduced-fat cheeses. Nonfat, low-sodium ricotta or cottage cheese. Low-fat or nonfat yogurt. Low-fat, low-sodium cheese. Fats and oils Soft margarine without trans fats. Vegetable oil. Low-fat, reduced-fat, or light mayonnaise and salad dressings (reduced-sodium). Canola, safflower, olive, soybean, and sunflower oils. Avocado. Seasoning and other foods Herbs. Spices. Seasoning mixes without salt. Unsalted popcorn and pretzels. Fat-free sweets. What foods are not recommended? The items listed may not be a complete list. Talk with your dietitian about what dietary choices are best for you. Grains Baked goods made with fat, such as croissants, muffins, or some breads. Dry pasta or rice meal packs. Vegetables Creamed or fried vegetables. Vegetables in a cheese sauce. Regular canned vegetables (not low-sodium or reduced-sodium). Regular canned tomato sauce and paste (not low-sodium or reduced-sodium). Regular tomato and vegetable juice (not low-sodium or reduced-sodium).  [name]. Olives. Fruits Canned fruit in a light or heavy syrup. Fried fruit. Fruit in cream or butter sauce. Meat and other protein foods Fatty cuts of meat. Ribs. Fried meat. [name]. Sausage. Bologna and other processed lunch meats. Salami. Fatback. Hotdogs. Bratwurst. Salted nuts and seeds. Canned beans with added salt. Canned or smoked fish. Whole eggs or egg yolks. Chicken or [place] with skin. Dairy Whole or 2% milk, cream, and half-and-half. Whole or full-fat cream cheese. Whole-fat or sweetened yogurt. Full-fat cheese. Nondairy creamers. Whipped toppings. Processed cheese and cheese spreads. Fats and oils Butter. Stick margarine. Lard. Shortening. Ghee. Bacon fat. Tropical oils, such as coconut, palm kernel, or palm oil. Seasoning and other foods Salted popcorn and pretzels. Onion salt, garlic salt, seasoned salt, table salt, and sea salt. Worcestershire sauce. Tartar sauce. Barbecue sauce. Teriyaki sauce. Soy sauce, including reduced-sodium. Steak sauce. Canned and packaged gravies. Fish sauce. Oyster sauce. Cocktail sauce. Horseradish that you find on the shelf. Ketchup. Mustard. Meat flavorings and tenderizers. Bouillon cubes. Hot sauce and Tabasco sauce. Premade or packaged marinades. Premade or packaged taco seasonings. Relishes. Regular salad dressings. Where to find more information:  National Heart, Lung, and [place]: https://wilson-eaton.com/  American Heart Association: www.heart.org Summary  The DASH eating plan is a healthy eating plan that has been shown to reduce high blood pressure (hypertension). It may also reduce your risk for type  2 diabetes, heart disease, and stroke.  With the DASH eating plan, you should limit salt (sodium) intake to 2,300 mg a day. If you have hypertension, you may need to reduce your sodium intake to 1,500 mg a day.  When on the DASH eating plan, aim to eat more fresh fruits and vegetables, whole grains, lean proteins, low-fat dairy, and  heart-healthy fats.  Work with your health care provider or diet and nutrition specialist (dietitian) to adjust your eating plan to your individual calorie needs. This information is not intended to replace advice given to you by your health care provider. Make sure you discuss any questions you have with your health care provider. Document Released: 01/26/2011 Document Revised: 01/31/2016 Document Reviewed: 01/31/2016 Elsevier Interactive Patient Education  2018 [employer].    Steps to Quit Smoking Smoking tobacco can be bad for your health. It can also affect almost every organ in your body. Smoking puts you and people around you at risk for many serious long-lasting (chronic) diseases. Quitting smoking is hard, but it is one of the best things that you can do for your health. It is never too late to quit. What are the benefits of quitting smoking? When you quit smoking, you lower your risk for getting serious diseases and conditions. They can include:  Lung cancer or lung disease.  Heart disease.  Stroke.  Heart attack.  Not being able to have children (infertility).  Weak bones (osteoporosis) and broken bones (fractures).  If you have coughing, wheezing, and shortness of breath, those symptoms may get better when you quit. You may also get sick less often. If you are pregnant, quitting smoking can help to lower your chances of having a baby of low birth weight. What can I do to help me quit smoking? Talk with your doctor about what can help you quit smoking. Some things you can do (strategies) include:  Quitting smoking totally, instead of slowly cutting back how much you smoke over a period of time.  Going to in-person counseling. You are more likely to quit if you go to many counseling sessions.  Using resources and support systems, such as: ? Database administrator with a Social worker. ? Phone quitlines. ? Careers information officer. ? Support groups or group counseling. ? Text  messaging programs. ? Mobile phone apps or applications.  Taking medicines. Some of these medicines may have nicotine in them. If you are pregnant or breastfeeding, do not take any medicines to quit smoking unless your doctor says it is okay. Talk with your doctor about counseling or other things that can help you.  Talk with your doctor about using more than one strategy at the same time, such as taking medicines while you are also going to in-person counseling. This can help make quitting easier. What things can I do to make it easier to quit? Quitting smoking might feel very hard at first, but there is a lot that you can do to make it easier. Take these steps:  Talk to your family and friends. Ask them to support and encourage you.  Call phone quitlines, reach out to support groups, or work with a Social worker.  Ask people who smoke to not smoke around you.  Avoid places that make you want (trigger) to smoke, such as: ? Bars. ? Parties. ? Smoke-break areas at work.  Spend time with people who do not smoke.  Lower the stress in your life. Stress can make you want to smoke. Try these things to  help your stress: ? Getting regular exercise. ? Deep-breathing exercises. ? Yoga. ? Meditating. ? Doing a body scan. To do this, close your eyes, focus on one area of your body at a time from head to toe, and notice which parts of your body are tense. Try to relax the muscles in those areas.  Download or buy apps on your mobile phone or tablet that can help you stick to your quit plan. There are many free apps, such as QuitGuide from the [employer] Office manager for Disease Control and Prevention). You can find more support from smokefree.gov and other websites.  This information is not intended to replace advice given to you by your health care provider. Make sure you discuss any questions you have with your health care provider. Document Released: 12/03/2008 Document Revised: 10/05/2015 Document Reviewed:  06/23/2014 Elsevier Interactive Patient Education  [employer].     If you need a refill on your cardiac medications before your next appointment, please call your pharmacy.

## 2017-11-01 ENCOUNTER — Telehealth: Payer: Self-pay | Admitting: Cardiology

## 2017-11-01 DIAGNOSIS — E785 Hyperlipidemia, unspecified: Secondary | ICD-10-CM

## 2017-11-01 MED ORDER — SIMVASTATIN 10 MG PO TABS: 10.0000 mg | ORAL_TABLET | Freq: Every day | ORAL | 3 refills | Status: DC

## 2017-11-01 NOTE — Telephone Encounter (Signed)
Spoke to patient about lab results/recommendations.  She said that she has been taking her Simvastatin 10 mg every other day.  Due to the elevated LDL, it was recommended that she increases her Simvastatin to 20 mg every other day, but she prefers to take 10 mg daily, because it causes her muscle pain.  She is supposed to have labs drawn again in 8 weeks, but will f/u with that at her mid October appt (please note) with Dr Tamala Julian.

## 2017-11-01 NOTE — Addendum Note (Signed)
Addended by: Frederik Schmidt on: 11/01/2017 01:04 PM   Modules accepted: Orders

## 2017-11-01 NOTE — Telephone Encounter (Signed)
Patient calling for lab results.  Please call.

## 2017-11-01 NOTE — Telephone Encounter (Signed)
She can take simvastatin 10 mg daily and check lipids with f/u in October.

## 2017-11-01 NOTE — Telephone Encounter (Signed)
Spoke to patient and gave her recommendations.  She verbalized understanding and will take Simvastatin 10 mg daily and come in for Lipid lab draw 10/15.

## 2017-11-05 ENCOUNTER — Telehealth (HOSPITAL_COMMUNITY): Payer: Self-pay | Admitting: *Deleted

## 2017-11-05 NOTE — Telephone Encounter (Signed)
Patient given detailed instructions per Myocardial Perfusion Study Information Sheet for the test on 11/07/17 at 1000. Patient notified to arrive 15 minutes early and that it is imperative to arrive on time for appointment to keep from having the test rescheduled.  If you need to cancel or reschedule your appointment, please call the office within 24 hours of your appointment. . Patient verbalized understanding.[name], [name]

## 2017-11-07 ENCOUNTER — Ambulatory Visit (HOSPITAL_COMMUNITY): Payer: PPO | Attending: Cardiology

## 2017-11-07 ENCOUNTER — Telehealth: Payer: Self-pay | Admitting: *Deleted

## 2017-11-07 DIAGNOSIS — R079 Chest pain, unspecified: Secondary | ICD-10-CM | POA: Diagnosis not present

## 2017-11-07 LAB — MYOCARDIAL PERFUSION IMAGING
LV dias vol: 94 mL (ref 46–106)
LV sys vol: 45 mL
Peak HR: 80 {beats}/min
Rest HR: 57 {beats}/min
SDS: 1
SRS: 0
SSS: 1
TID: 1.08

## 2017-11-07 MED ORDER — TECHNETIUM TC 99M TETROFOSMIN IV KIT
29.4000 | PACK | Freq: Once | INTRAVENOUS | Status: AC | PRN
  Administered 2017-11-07: 29.4 via INTRAVENOUS
  Filled 2017-11-07: qty 30

## 2017-11-07 MED ORDER — REGADENOSON 0.4 MG/5ML IV SOLN
0.4000 mg | Freq: Once | INTRAVENOUS | Status: AC
  Administered 2017-11-07: 0.4 mg via INTRAVENOUS

## 2017-11-07 MED ORDER — TECHNETIUM TC 99M TETROFOSMIN IV KIT
9.1000 | PACK | Freq: Once | INTRAVENOUS | Status: AC | PRN
  Administered 2017-11-07: 9.1 via INTRAVENOUS
  Filled 2017-11-07: qty 10

## 2017-11-07 NOTE — Telephone Encounter (Signed)
Pt requesting her pharmacy preference to be [facility] on [address]  Change reflected in the pts chart.

## 2017-11-08 ENCOUNTER — Telehealth: Payer: Self-pay

## 2017-11-08 ENCOUNTER — Other Ambulatory Visit: Payer: Self-pay | Admitting: *Deleted

## 2017-11-08 MED ORDER — NITROGLYCERIN 0.4 MG SL SUBL: 0.4000 mg | SUBLINGUAL_TABLET | SUBLINGUAL | 3 refills | Status: DC | PRN

## 2017-11-08 MED ORDER — SIMVASTATIN 10 MG PO TABS: 10.0000 mg | ORAL_TABLET | Freq: Every day | ORAL | 3 refills | Status: DC

## 2017-11-08 NOTE — Telephone Encounter (Signed)
-----   Message from Daune Perch, NP sent at 11/07/2017  7:16 PM EDT ----- Stress test did not show any altered blood flow to the heart but it did show mildly reduced pumping function. Echocardiogram is recommended to confirm heart function as it is a better test for this. Please let pt know of her results and arrange for echo. Keep appt with [name] on 10/16.   Please send copy to Wenda Low, MD  Daune Perch, NP

## 2017-11-08 NOTE — Telephone Encounter (Signed)
Notes recorded by Frederik Schmidt, RN on 11/08/2017 at 8:29 AM EDT I informed patient of stress test results per Daune Perch and Dr Tamala Julian. ------

## 2017-11-11 ENCOUNTER — Ambulatory Visit (HOSPITAL_COMMUNITY)
Admission: EM | Admit: 2017-11-11 | Discharge: 2017-11-11 | Disposition: A | Discharge status: Home / Self Care | Payer: PPO | Attending: Urgent Care | Admitting: Urgent Care

## 2017-11-11 ENCOUNTER — Encounter (HOSPITAL_COMMUNITY): Payer: Self-pay | Admitting: Urgent Care

## 2017-11-11 DIAGNOSIS — J22 Unspecified acute lower respiratory infection: Secondary | ICD-10-CM

## 2017-11-11 DIAGNOSIS — R05 Cough: Secondary | ICD-10-CM | POA: Diagnosis not present

## 2017-11-11 DIAGNOSIS — R059 Cough, unspecified: Secondary | ICD-10-CM

## 2017-11-11 DIAGNOSIS — R0602 Shortness of breath: Secondary | ICD-10-CM | POA: Diagnosis not present

## 2017-11-11 DIAGNOSIS — J449 Chronic obstructive pulmonary disease, unspecified: Secondary | ICD-10-CM | POA: Diagnosis not present

## 2017-11-11 MED ORDER — HYDROCODONE-HOMATROPINE 5-1.5 MG/5ML PO SYRP: 5.0000 mL | ORAL_SOLUTION | Freq: Every evening | ORAL | 0 refills | Status: DC | PRN

## 2017-11-11 MED ORDER — AZITHROMYCIN 250 MG PO TABS: 250.0000 mg | ORAL_TABLET | Freq: Every day | ORAL | 0 refills | Status: DC

## 2017-11-11 MED ORDER — PREDNISONE 20 MG PO TABS: ORAL_TABLET | ORAL | 0 refills | Status: DC

## 2017-11-11 NOTE — Discharge Instructions (Signed)
For sore throat try using a honey-based tea. Use 3 teaspoons of honey with juice squeezed from half lemon. Place shaved pieces of ginger into 1/2-1 cup of water and warm over stove top. Then mix the ingredients and repeat every 4 hours as needed. Hydrate well with at least 2 liters (64 ounces) of water daily.

## 2017-11-11 NOTE — ED Provider Notes (Addendum)
MRN: [id number] DOB: February 14, 1951  Subjective:   [name] is a 67 y.o. female presenting for 9 day history of nasal congestion, dry cough, chest congestion, shob worse at night. Has used her nebulized albuterol, otc cough medications. Denies chest pain, shob, ear pain, sinus pain, throat pain, belly pain.  reports that she has quit smoking. Her smoking use included cigarettes. She started smoking about 49 years ago. She has a 12.00 pack-year smoking history. She has never used smokeless tobacco. She reports that she drinks alcohol. She reports that she does not use drugs.  Has a history of COPD.  She states that she just had a stress test this past week and was fine.  No current facility-administered medications for this encounter.   Current Outpatient Medications:  .  acetaminophen (TYLENOL) 500 MG tablet, Take 500-1,000 mg by mouth every 6 (six) hours as needed for mild pain or headache (depends on pain if takes 1-2 tablets). , Disp: , Rfl:  .  albuterol (PROVENTIL HFA;VENTOLIN HFA) 108 (90 BASE) MCG/ACT inhaler, Inhale 2 puffs into the lungs every 6 (six) hours as needed for wheezing or shortness of breath., Disp: 1 Inhaler, Rfl: 2 .  ALPRAZolam (XANAX) 0.5 MG tablet, Take 0.5 mg by mouth at bedtime. , Disp: , Rfl:  .  Ascorbic Acid (VITAMIN C PO), Take 2 tablets by mouth daily., Disp: , Rfl:  .  Biotin 1000 MCG tablet, Take 1,000 mcg by mouth daily., Disp: , Rfl:  .  clopidogrel (PLAVIX) 75 MG tablet, Take 1 tablet (75 mg total) by mouth daily. Please keep upcoming appointment for additional refills thanks, Disp: 30 tablet, Rfl: 1 .  clotrimazole-betamethasone (LOTRISONE) cream, use 1 application to affected area as needed for sore areas on body, Disp: , Rfl:  .  escitalopram (LEXAPRO) 20 MG tablet, Take 20 mg by mouth daily., Disp: , Rfl:  .  hydrochlorothiazide (MICROZIDE) 12.5 MG capsule, Take 1 capsule (12.5 mg total) by mouth daily. Please keep upcoming appt in October with [name]  for future refills. Thank you, Disp: 30 capsule, Rfl: 2 .  Hyoscyamine Sulfate (HYOSCYAMINE PO), Take by mouth., Disp: , Rfl:  .  lansoprazole (PREVACID) 30 MG capsule, Take 30 mg by mouth daily as needed (for ulcer/acid reflex). Ulcer/ acid reflux, Disp: , Rfl:  .  metoprolol succinate (TOPROL-XL) 100 MG 24 hr tablet, Take 1  & 1/2 (one & one-half) tablets by mouth daily. Please keep upcoming appt in October with [name] for future refills. Thank you, Disp: 45 tablet, Rfl: 2 .  mupirocin ointment (BACTROBAN) 2 %, Apply twice a day to right arm wound (Patient taking differently: Apply 1 application topically as needed. ), Disp: 30 g, Rfl: 1 .  nitroGLYCERIN (NITROSTAT) 0.4 MG SL tablet, Place 1 tablet (0.4 mg total) under the tongue every 5 (five) minutes as needed for chest pain., Disp: 25 tablet, Rfl: 3 .  simvastatin (ZOCOR) 10 MG tablet, Take 1 tablet (10 mg total) by mouth at bedtime., Disp: 90 tablet, Rfl: 3 .  telmisartan (MICARDIS) 20 MG tablet, Take 1 tablet (20 mg total) by mouth daily. (Patient not taking: Reported on 10/30/2017), Disp: 30 tablet, Rfl: 2 .  vitamin E 400 UNIT capsule, Take 400 Units by mouth daily., Disp: , Rfl:    Allergies  Allergen Reactions  . Diphenhydramine Hcl Hives    Other reaction(s): Hives  . Bactrim [Sulfamethoxazole-Trimethoprim] Nausea And Vomiting  . Sulfamethoxazole-Trimethoprim Nausea And Vomiting    Other reaction(s):  Nausea And Vomiting    Past Medical History:  Diagnosis Date  . Anxiety   . Arthritis   . CAD (coronary artery disease)   . COPD (chronic obstructive pulmonary disease) ([place])   . Depression   . Dyspnea   . GERD (gastroesophageal reflux disease)   . Hepatitis C   . Hypertension   . Peripheral vascular disease ([place])   . Polycythemia 2009  . Seizures ([place]) 1970   x1- pt. reports that there was never any causative agent       Past Surgical History:  Procedure Laterality Date  . 2 stints  Aug 26,2010   Dr. Tawnya Crook  .  ABDOMINAL SURGERY  1970's   for excessive bleeding from loss of pregnancy, required blood transfusion post procedure   . cartoid endartherectomy  2007  . CORONARY ANGIOPLASTY WITH STENT PLACEMENT  10/09/2008  . ENDARTERECTOMY Left 07/24/2016   Procedure: ENDARTERECTOMY CAROTID;  Surgeon: [name], MD;  Location: [facility];  Service: Vascular;  Laterality: Left;  . FLEXIBLE SIGMOIDOSCOPY N/A 03/22/2017   Procedure: FLEXIBLE SIGMOIDOSCOPY;  Surgeon: Carol Ada, MD;  Location: [facility];  Service: Endoscopy;  Laterality: N/A;  . LYMPH NODE DISSECTION Left 1990's   benign   . TUBAL LIGATION      Family History  Problem Relation Age of Onset  . Heart disease Mother   . Heart disease Father      Immunization History  Administered Date(s) Administered  . Influenza Split 04/01/2012  . Influenza Whole 01/06/2009  . Influenza,inj,Quad PF,6+ Mos 01/05/2014  . Influenza-Unspecified 11/27/2015    Objective:   Vitals: BP (!) 156/70 (BP Location: Right Arm)   Pulse 67   Temp 98.3 F (36.8 C) (Oral)   Resp 18   SpO2 95%   Physical Exam  Constitutional: She is oriented to person, place, and time. She appears well-developed and well-nourished.  HENT:  Mouth/Throat: Oropharynx is clear and moist.  Eyes: No scleral icterus.  Cardiovascular: Normal rate, regular rhythm, normal heart sounds and intact distal pulses. Exam reveals no gallop and no friction rub.  No murmur heard. Pulmonary/Chest: Effort normal. No stridor. No respiratory distress. She has wheezes. She has no rales.  No use of accessory muscles noted.  Neurological: She is alert and oriented to person, place, and time.  Psychiatric: She has a normal mood and affect.   Assessment and Plan :   Lower respiratory infection  Cough  Shortness of breath  Chronic obstructive pulmonary disease, unspecified COPD type ([place])  Will address lower respiratory infection with azithromycin, prednisone course.  Patient refused  Tessalon capsules and Tylenol with codeine cough syrup.  We will try Hycodan for cough suppression.  Patient is to maintain her albuterol use.  She states that she did not want a refill of her inhaler, states that she just got this and will continue to use her nebulizer as well. Counseled patient on potential for adverse effects with medications prescribed today, patient verbalized understanding. Return-to-clinic precautions discussed, patient verbalized understanding.    Jaynee Eagles, PA-C 11/11/17 1601

## 2017-11-11 NOTE — ED Triage Notes (Signed)
Pt presents with cold symptoms; nasal drainage, congestion and persistent cough.

## 2017-11-19 DIAGNOSIS — R319 Hematuria, unspecified: Secondary | ICD-10-CM | POA: Diagnosis not present

## 2017-11-19 DIAGNOSIS — J449 Chronic obstructive pulmonary disease, unspecified: Secondary | ICD-10-CM | POA: Diagnosis not present

## 2017-11-19 DIAGNOSIS — R309 Painful micturition, unspecified: Secondary | ICD-10-CM | POA: Diagnosis not present

## 2017-12-04 ENCOUNTER — Other Ambulatory Visit: Payer: PPO

## 2017-12-04 DIAGNOSIS — E785 Hyperlipidemia, unspecified: Secondary | ICD-10-CM | POA: Diagnosis not present

## 2017-12-04 NOTE — Progress Notes (Signed)
Cardiology Office Note:    Date:  12/05/2017   ID:  [name], DOB [birth year], MRN [id number]  PCP:  Wenda Low, MD  Cardiologist:  Sinclair Grooms, MD   Referring MD: Wenda Low, MD   Chief Complaint  Patient presents with  . Coronary Artery Disease  . Chest Pain    History of Present Illness:    [name] is a 67 y.o. female with a hx of hypertension, CAD s/p stent 2010, left carotid endarterectomy 07/2016, right carotid endarterectomy 2007, polycythemia vera, tobacco abuse, COPD, GERD and anxiety.  Polycythemia vera and hepatitis C followed by Dr. Marin Olp, hematology with intermittent phlebotomy.  She has polycythemia vera.  Underwent phlebotomy most recently October 19, 2017.  After phlebotomy she began experiencing exertional chest tightness.  She was seen in the office September 10.  A nuclear perfusion study was performed September  Past Medical History:  Diagnosis Date  . Anxiety   . Arthritis   . CAD (coronary artery disease)   . COPD (chronic obstructive pulmonary disease) ([place])   . Depression   . Dyspnea   . GERD (gastroesophageal reflux disease)   . Hepatitis C   . Hypertension   . Peripheral vascular disease ([place])   . Polycythemia 2009  . Seizures ([place]) 1970   x1- pt. reports that there was never any causative agent      Past Surgical History:  Procedure Laterality Date  . 2 stints  Aug 26,2010   Dr. Tawnya Crook  . ABDOMINAL SURGERY  1970's   for excessive bleeding from loss of pregnancy, required blood transfusion post procedure   . cartoid endartherectomy  2007  . CORONARY ANGIOPLASTY WITH STENT PLACEMENT  10/09/2008  . ENDARTERECTOMY Left 07/24/2016   Procedure: ENDARTERECTOMY CAROTID;  Surgeon: [name], MD;  Location: [facility];  Service: Vascular;  Laterality: Left;  . FLEXIBLE SIGMOIDOSCOPY N/A 03/22/2017   Procedure: FLEXIBLE SIGMOIDOSCOPY;  Surgeon: Carol Ada, MD;  Location: [facility];  Service: Endoscopy;  Laterality:  N/A;  . LYMPH NODE DISSECTION Left 1990's   benign   . TUBAL LIGATION      Current Medications: Current Meds  Medication Sig  . acetaminophen (TYLENOL) 500 MG tablet Take 500-1,000 mg by mouth every 6 (six) hours as needed for mild pain or headache (depends on pain if takes 1-2 tablets).   Marland Kitchen albuterol (PROVENTIL HFA;VENTOLIN HFA) 108 (90 BASE) MCG/ACT inhaler Inhale 2 puffs into the lungs every 6 (six) hours as needed for wheezing or shortness of breath.  . ALPRAZolam (XANAX) 0.5 MG tablet Take 0.5 mg by mouth at bedtime.   . Ascorbic Acid (VITAMIN C PO) Take 2 tablets by mouth daily.  . Biotin 1000 MCG tablet Take 1,000 mcg by mouth daily.  . clopidogrel (PLAVIX) 75 MG tablet Take 1 tablet (75 mg total) by mouth daily.  . clotrimazole-betamethasone (LOTRISONE) cream use 1 application to affected area as needed for sore areas on body  . escitalopram (LEXAPRO) 20 MG tablet Take 20 mg by mouth daily.  . hydrochlorothiazide (MICROZIDE) 12.5 MG capsule Take 1 capsule (12.5 mg total) by mouth daily.  Marland Kitchen HYDROcodone-homatropine (HYCODAN) 5-1.5 MG/5ML syrup Take 5 mLs by mouth at bedtime as needed.  Marland Kitchen Hyoscyamine Sulfate (HYOSCYAMINE PO) Take by mouth.  . lansoprazole (PREVACID) 30 MG capsule Take 30 mg by mouth daily as needed (for ulcer/acid reflex). Ulcer/ acid reflux  . metoprolol succinate (TOPROL-XL) 100 MG 24 hr tablet Take 1  & 1/2 (  one & one-half) tablets by mouth daily.  . mupirocin ointment (BACTROBAN) 2 % Apply twice a day to right arm wound (Patient taking differently: Apply 1 application topically as needed. )  . nitroGLYCERIN (NITROSTAT) 0.4 MG SL tablet Place 1 tablet (0.4 mg total) under the tongue every 5 (five) minutes as needed for chest pain.  . simvastatin (ZOCOR) 10 MG tablet Take 1 tablet (10 mg total) by mouth at bedtime.  Marland Kitchen telmisartan (MICARDIS) 20 MG tablet Take 1 tablet (20 mg total) by mouth daily.  . vitamin E 400 UNIT capsule Take 400 Units by mouth daily.  .  [DISCONTINUED] clopidogrel (PLAVIX) 75 MG tablet Take 1 tablet (75 mg total) by mouth daily. Please keep upcoming appointment for additional refills thanks  . [DISCONTINUED] hydrochlorothiazide (MICROZIDE) 12.5 MG capsule Take 1 capsule (12.5 mg total) by mouth daily. Please keep upcoming appt in October with [name] for future refills. Thank you  . [DISCONTINUED] metoprolol succinate (TOPROL-XL) 100 MG 24 hr tablet Take 1  & 1/2 (one & one-half) tablets by mouth daily. Please keep upcoming appt in October with [name] for future refills. Thank you  . [DISCONTINUED] nitroGLYCERIN (NITROSTAT) 0.4 MG SL tablet Place 1 tablet (0.4 mg total) under the tongue every 5 (five) minutes as needed for chest pain.  . [DISCONTINUED] simvastatin (ZOCOR) 10 MG tablet Take 1 tablet (10 mg total) by mouth at bedtime.  . [DISCONTINUED] telmisartan (MICARDIS) 20 MG tablet Take 1 tablet (20 mg total) by mouth daily.     Allergies:   Diphenhydramine hcl; Bactrim [sulfamethoxazole-trimethoprim]; and Sulfamethoxazole-trimethoprim   Social History   Socioeconomic History  . Marital status: Single    Spouse name: Not on file  . Number of children: Not on file  . Years of education: Not on file  . Highest education level: Not on file  Occupational History  . Not on file  Social Needs  . Financial resource strain: Not on file  . Food insecurity:    Worry: Not on file    Inability: Not on file  . Transportation needs:    Medical: Not on file    Non-medical: Not on file  Tobacco Use  . Smoking status: Former Smoker    Packs/day: 0.25    Years: 48.00    Pack years: 12.00    Types: Cigarettes    Start date: 03/20/1968  . Smokeless tobacco: Never Used  . Tobacco comment: "1 pk lasts about 3 days", uses VAPE, uses nicorette patch   Substance and Sexual Activity  . Alcohol use: Yes    Alcohol/week: 0.0 standard drinks    Comment: social- 2-3 x's per month   . Drug use: No    Comment: has smoked cigarettes  off and on  . Sexual activity: Not Currently  Lifestyle  . Physical activity:    Days per week: Not on file    Minutes per session: Not on file  . Stress: Not on file  Relationships  . Social connections:    Talks on phone: Not on file    Gets together: Not on file    Attends religious service: Not on file    Active member of club or organization: Not on file    Attends meetings of clubs or organizations: Not on file    Relationship status: Not on file  Other Topics Concern  . Not on file  Social History Narrative   Tobacco use cigarettes: Current smoker   Frequency 1 PPD  Estimated Pack - years :30   Smoking : yes    No Tobacco exposure   No alcohol   No exercise   Occupation : employed, works at home for [employer], Press photographer   Children Boys 1 Girls 1     Family History: The patient's family history includes Heart disease in her father and mother.  ROS:   Please see the history of present illness.    She has moved in with her mother to give around-the-clock care.  Her mother is 8.  Salvatore is stressed by this possibility.  She is resumed smoking.  She complains of unexpected weight loss, change in appetite, hearing loss, cough, shortness of breath, abdominal discomfort, blood in her stool, blood in her urine, dizziness, easy bruising, wheezing, excessive sweating and fatigue.  She is not sleeping well.  All other systems reviewed and are negative.  EKGs/Labs/Other Studies Reviewed:    The following studies were reviewed today:  Myocardial perfusion study November 07, 2017: Study Highlights     Nuclear stress EF: 52%.  There was no ST segment deviation noted during stress.  This is a low risk study.  The left ventricular ejection fraction is mildly decreased (45-54%).   1. EF 52%.  Gating does not look ideal, would confirm EF with echo.  2. No evidence for ischemia or infarction on perfusion images.   Low risk study.       EKG:  EKG is not ordered today.  The ekg performed 10/29/2017 reveals normal sinus rhythm with LVH and strain pattern.  Recent Labs: 01/03/2017: TSH 3.731 10/15/2017: ALT 16; Hemoglobin 15.3; Platelet Count 259 10/30/2017: BUN 11; Creatinine, Ser 0.63; Potassium 3.9; Sodium 131  Recent Lipid Panel    Component Value Date/Time   CHOL 142 12/04/2017 1203   TRIG 95 12/04/2017 1203   HDL 62 12/04/2017 1203   CHOLHDL 2.3 12/04/2017 1203   CHOLHDL 5.1 (H) 09/16/2014 1322   VLDL 37 (H) 09/16/2014 1322   LDLCALC 61 12/04/2017 1203    Physical Exam:    VS:  BP (!) 150/64   Pulse 61   Ht 5' (1.524 m)   Wt 115 lb 6.4 oz (52.3 kg)   SpO2 97%   BMI 22.54 kg/m     Wt Readings from Last 3 Encounters:  12/05/17 115 lb 6.4 oz (52.3 kg)  11/07/17 116 lb (52.6 kg)  10/30/17 116 lb 12.8 oz (53 kg)     GEN: Smells of stale cigarette smoke.  Well nourished, well developed in no acute distress HEENT: Normal NECK: No JVD. LYMPHATICS: No lymphadenopathy CARDIAC: RRR, no murmur, S4 gallop, no edema. VASCULAR: 2+ bilateral carotid and radial pulses.  No bruits. RESPIRATORY:  Clear to auscultation without rales, wheezing or rhonchi  ABDOMEN: Soft, non-tender, non-distended, No pulsatile mass, MUSCULOSKELETAL: No deformity  SKIN: Warm and dry NEUROLOGIC:  Alert and oriented x 3 PSYCHIATRIC:  Normal affect   ASSESSMENT:    1. Coronary artery disease involving native coronary artery of native heart with angina pectoris ([place])   2. Essential hypertension   3. Hyperlipidemia LDL goal <70   4. Polycythemia vera ([place])   5. Asymptomatic stenosis of left carotid artery   6. Tobacco abuse   7. COPD exacerbation ([place])    PLAN:    In order of problems listed above:  1. Low risk myocardial perfusion study.  I have counseled her against smoking.  If discomfort continues she may need to have  coronary angiography.  She has not had chest discomfort since completion of the stress test in mid  September.  He has been active at home without provokable angina. 2. Very poorly controlled blood pressure.  She has not taken her medications yet today.  We had a long discussion concerning the importance of taking the blood pressure medication on a regular schedule every day.  However preferred that she take all of her blood pressure medications early in the day to give protection against blood pressure elevations that occur related to stress involved with taking care of her mother.  She will return in 1 month for repeat blood pressure.  She was concerned about whether she should use the telmisartan that was prescribed by Dr. Marin Olp.  This was an excellent add-on choice and I encouraged her to use the telmisartan along with Toprol-XL 150 mg/day, hydrochlorothiazide 12.5 mg/day, and low-salt diet.  If blood pressure still elevated on return we may further titrate beta-blocker or more likely add amlodipine. 3. LDL cholesterol target is less than 70.  The most recent value was 61 performed yesterday.  Continue current dose 4. Per Dr. Marin Olp 5. Followed by vascular surgery 6. Again counseled to stop smoking.  She is not willing to commit at this time due to stress at home.  Clinical follow-up in 1 month.  Encourage 150 minutes of moderate aerobic activity per week.  We discussed secondary risk prevention including LDL less than 70, target blood pressure 130/80 mmHg, hemoglobin A1c less than 7.  Smoking cessation is very important but she is unable to commit at this time.  Prolonged complicated office visit with greater than 50% of the time spent and education, coordination of care, and counseling.   Medication Adjustments/Labs and Tests Ordered: Current medicines are reviewed at length with the patient today.  Concerns regarding medicines are outlined above.  No orders of the defined types were placed in this encounter.  Meds ordered this encounter  Medications  . telmisartan (MICARDIS) 20 MG  tablet    Sig: Take 1 tablet (20 mg total) by mouth daily.    Dispense:  90 tablet    Refill:  3  . simvastatin (ZOCOR) 10 MG tablet    Sig: Take 1 tablet (10 mg total) by mouth at bedtime.    Dispense:  90 tablet    Refill:  3    Please place on hold.  Pt will contact when ready to pick up.  . metoprolol succinate (TOPROL-XL) 100 MG 24 hr tablet    Sig: Take 1  & 1/2 (one & one-half) tablets by mouth daily.    Dispense:  135 tablet    Refill:  3    Please keep upcoming appt in October with [name] for future refills. Thank you  . hydrochlorothiazide (MICROZIDE) 12.5 MG capsule    Sig: Take 1 capsule (12.5 mg total) by mouth daily.    Dispense:  45 capsule    Refill:  3    Please keep upcoming appt in October with [name] for future refills. Thank you  . clopidogrel (PLAVIX) 75 MG tablet    Sig: Take 1 tablet (75 mg total) by mouth daily.    Dispense:  90 tablet    Refill:  3    Please consider 90 day supplies to promote better adherence  . nitroGLYCERIN (NITROSTAT) 0.4 MG SL tablet    Sig: Place 1 tablet (0.4 mg total) under the tongue every 5 (five) minutes as needed  for chest pain.    Dispense:  25 tablet    Refill:  3    Patient Instructions  Your physician recommends that you continue on your current medications as directed. Please refer to the Current Medication list given to you today.  Your physician wants you to follow-up in: [place] will receive a reminder letter in the mail two months in advance. If you don't receive a letter, please call our office to schedule the follow-up appointment.     Signed, Sinclair Grooms, MD  12/05/2017 5:36 PM    [place]

## 2017-12-05 ENCOUNTER — Ambulatory Visit: Payer: PPO | Admitting: Interventional Cardiology

## 2017-12-05 ENCOUNTER — Encounter: Payer: Self-pay | Admitting: Interventional Cardiology

## 2017-12-05 VITALS — BP 150/64 | HR 61 | Ht 60.0 in | Wt 115.4 lb

## 2017-12-05 DIAGNOSIS — Z720 Tobacco use: Secondary | ICD-10-CM

## 2017-12-05 DIAGNOSIS — E785 Hyperlipidemia, unspecified: Secondary | ICD-10-CM

## 2017-12-05 DIAGNOSIS — J441 Chronic obstructive pulmonary disease with (acute) exacerbation: Secondary | ICD-10-CM | POA: Diagnosis not present

## 2017-12-05 DIAGNOSIS — D45 Polycythemia vera: Secondary | ICD-10-CM

## 2017-12-05 DIAGNOSIS — I10 Essential (primary) hypertension: Secondary | ICD-10-CM | POA: Diagnosis not present

## 2017-12-05 DIAGNOSIS — I6522 Occlusion and stenosis of left carotid artery: Secondary | ICD-10-CM

## 2017-12-05 DIAGNOSIS — I25119 Atherosclerotic heart disease of native coronary artery with unspecified angina pectoris: Secondary | ICD-10-CM

## 2017-12-05 LAB — LIPID PANEL
Chol/HDL Ratio: 2.3 ratio (ref 0.0–4.4)
Cholesterol, Total: 142 mg/dL (ref 100–199)
HDL: 62 mg/dL (ref >39)
LDL Calculated: 61 mg/dL (ref 0–99)
Triglycerides: 95 mg/dL (ref 0–149)
VLDL Cholesterol Cal: 19 mg/dL (ref 5–40)

## 2017-12-05 MED ORDER — METOPROLOL SUCCINATE ER 100 MG PO TB24: ORAL_TABLET | ORAL | 3 refills | Status: DC

## 2017-12-05 MED ORDER — HYDROCHLOROTHIAZIDE 12.5 MG PO CAPS: 12.5000 mg | ORAL_CAPSULE | Freq: Every day | ORAL | 3 refills | Status: DC

## 2017-12-05 MED ORDER — SIMVASTATIN 10 MG PO TABS: 10.0000 mg | ORAL_TABLET | Freq: Every day | ORAL | 3 refills | Status: DC

## 2017-12-05 MED ORDER — TELMISARTAN 20 MG PO TABS: 20.0000 mg | ORAL_TABLET | Freq: Every day | ORAL | 3 refills | Status: DC

## 2017-12-05 MED ORDER — NITROGLYCERIN 0.4 MG SL SUBL: 0.4000 mg | SUBLINGUAL_TABLET | SUBLINGUAL | 3 refills | Status: DC | PRN

## 2017-12-05 MED ORDER — CLOPIDOGREL BISULFATE 75 MG PO TABS: 75.0000 mg | ORAL_TABLET | Freq: Every day | ORAL | 3 refills | Status: DC

## 2017-12-05 NOTE — Patient Instructions (Addendum)
Your physician recommends that you continue on your current medications as directed. Please refer to the Current Medication list given to you today.  Your physician wants you to follow-up in: [place] will receive a reminder letter in the mail two months in advance. If you don't receive a letter, please call our office to schedule the follow-up appointment.

## 2017-12-06 ENCOUNTER — Telehealth: Payer: Self-pay | Admitting: Interventional Cardiology

## 2017-12-06 NOTE — Telephone Encounter (Signed)
Left message for pt to call back.  Pt needs to be scheduled to see [name] in a month.  Ok to add pt on 11/18 or 21st at  2:40pm.

## 2017-12-07 NOTE — Telephone Encounter (Signed)
Spoke with pt and scheduled her to see [name] 11/18

## 2017-12-31 ENCOUNTER — Inpatient Hospital Stay: Payer: PPO | Attending: Hematology & Oncology | Admitting: Hematology & Oncology

## 2017-12-31 ENCOUNTER — Other Ambulatory Visit: Payer: Self-pay

## 2017-12-31 ENCOUNTER — Encounter: Payer: Self-pay | Admitting: Hematology & Oncology

## 2017-12-31 ENCOUNTER — Inpatient Hospital Stay: Payer: PPO

## 2017-12-31 VITALS — BP 185/87 | HR 57 | Temp 98.2°F | Resp 18 | Wt 114.0 lb

## 2017-12-31 DIAGNOSIS — D45 Polycythemia vera: Secondary | ICD-10-CM | POA: Diagnosis present

## 2017-12-31 DIAGNOSIS — Z720 Tobacco use: Secondary | ICD-10-CM

## 2017-12-31 DIAGNOSIS — D751 Secondary polycythemia: Secondary | ICD-10-CM | POA: Diagnosis not present

## 2017-12-31 DIAGNOSIS — Z79899 Other long term (current) drug therapy: Secondary | ICD-10-CM | POA: Insufficient documentation

## 2017-12-31 LAB — CMP (CANCER CENTER ONLY)
ALBUMIN: 3.6 g/dL (ref 3.5–5.0)
ALK PHOS: 83 U/L (ref 26–84)
ALT: 18 U/L (ref 10–47)
AST: 30 U/L (ref 11–38)
Anion gap: 9 (ref 5–15)
BILIRUBIN TOTAL: 0.7 mg/dL (ref 0.2–1.6)
BUN: 11 mg/dL (ref 7–22)
CALCIUM: 9.1 mg/dL (ref 8.0–10.3)
CO2: 29 mmol/L (ref 18–33)
CREATININE: 0.7 mg/dL (ref 0.60–1.20)
Chloride: 98 mmol/L (ref 98–108)
Glucose, Bld: 107 mg/dL (ref 73–118)
Potassium: 4.3 mmol/L (ref 3.3–4.7)
Sodium: 136 mmol/L (ref 128–145)
Total Protein: 6.7 g/dL (ref 6.4–8.1)

## 2017-12-31 LAB — CBC WITH DIFFERENTIAL (CANCER CENTER ONLY)
Abs Immature Granulocytes: 0.01 10*3/uL (ref 0.00–0.07)
BASOS ABS: 0 10*3/uL (ref 0.0–0.1)
Basophils Relative: 1 %
Eosinophils Absolute: 0.1 10*3/uL (ref 0.0–0.5)
Eosinophils Relative: 1 %
HEMATOCRIT: 42.9 % (ref 36.0–46.0)
HEMOGLOBIN: 13.5 g/dL (ref 12.0–15.0)
IMMATURE GRANULOCYTES: 0 %
LYMPHS ABS: 2.2 10*3/uL (ref 0.7–4.0)
Lymphocytes Relative: 34 %
MCH: 28.6 pg (ref 26.0–34.0)
MCHC: 31.5 g/dL (ref 30.0–36.0)
MCV: 90.9 fL (ref 80.0–100.0)
Monocytes Absolute: 0.9 10*3/uL (ref 0.1–1.0)
Monocytes Relative: 13 %
NEUTROS PCT: 51 %
NRBC: 0 % (ref 0.0–0.2)
Neutro Abs: 3.3 10*3/uL (ref 1.7–7.7)
Platelet Count: 281 10*3/uL (ref 150–400)
RBC: 4.72 MIL/uL (ref 3.87–5.11)
RDW: 12.7 % (ref 11.5–15.5)
WBC Count: 6.4 10*3/uL (ref 4.0–10.5)

## 2017-12-31 NOTE — Progress Notes (Signed)
Hematology and Oncology Follow Up Visit  [name] [id number] 02/04/1951 67 y.o. 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  She is doing okay.  She still is losing a little bit of weight.  We have looked at everything that I could think of for her to have weight loss.  We have done CT scans.  She does smoke.  We are looking for any kind of underlying malignancy but have yet to find anything.  I do not think that she has transformed her polycythemia into leukemia or myelofibrosis.  She is still smoking.  She is trying to cut back.  She has had no issues with nausea or vomiting.  She is had no bleeding.  There is been no rashes.  She is had no headache.  She did have a cardiac stress test done a month or so ago.  She says that everything turned out okay.  She has had no fever.  She has had no swollen lymph nodes.  She is had no problems swallowing.  She does take care of her mom who is 3 years old.  She does try to exercise but this has been tough to do given the lack of time as she is helping to take care of her mom.  Overall, her performance status is ECOG 1.   Medications:  Allergies as of 12/31/2017      Reactions   Diphenhydramine Hcl Hives   Other reaction(s): Hives   Bactrim [sulfamethoxazole-trimethoprim] Nausea And Vomiting   Sulfamethoxazole-trimethoprim Nausea And Vomiting   Other reaction(s): Nausea And Vomiting      Medication List        Accurate as of 12/31/17  3:41 PM. Always use your most recent med list.          acetaminophen 500 MG tablet Commonly known as:  TYLENOL Take 500-1,000 mg by mouth every 6 (six) hours as needed for mild pain or headache (depends  on pain if takes 1-2 tablets).   albuterol 108 (90 Base) MCG/ACT inhaler Commonly known as:  PROVENTIL HFA;VENTOLIN HFA Inhale 2 puffs into the lungs every 6 (six) hours as needed for wheezing or shortness of breath.   ALPRAZolam 0.5 MG tablet Commonly known as:  XANAX Take 0.5 mg by mouth at bedtime.   Biotin 1000 MCG tablet Take 1,000 mcg by mouth daily.   clopidogrel 75 MG tablet Commonly known as:  PLAVIX Take 1 tablet (75 mg total) by mouth daily.   clotrimazole-betamethasone cream Commonly known as:  LOTRISONE use 1 application to affected area as needed for sore areas on body   escitalopram 20 MG tablet Commonly known as:  LEXAPRO Take 20 mg by mouth daily.   hydrochlorothiazide 12.5 MG capsule Commonly known as:  MICROZIDE Take 1 capsule (12.5 mg total) by mouth daily.   HYOSCYAMINE PO Take by mouth.   lansoprazole 30 MG capsule Commonly known as:  PREVACID Take 30 mg by mouth daily as needed (for ulcer/acid reflex). Ulcer/ acid reflux   metoprolol succinate 100 MG 24 hr tablet Commonly known as:  TOPROL-XL Take 1  & 1/2 (one & one-half) tablets by mouth daily.   mupirocin ointment 2 %  Commonly known as:  BACTROBAN Apply twice a day to right arm wound   nitroGLYCERIN 0.4 MG SL tablet Commonly known as:  NITROSTAT Place 1 tablet (0.4 mg total) under the tongue every 5 (five) minutes as needed for chest pain.   simvastatin 10 MG tablet Commonly known as:  ZOCOR Take 1 tablet (10 mg total) by mouth at bedtime.   telmisartan 20 MG tablet Commonly known as:  MICARDIS Take 1 tablet (20 mg total) by mouth daily.   VITAMIN C PO Take 2 tablets by mouth daily.   vitamin E 400 UNIT capsule Take 400 Units by mouth daily.       Allergies:  Allergies  Allergen Reactions  . Diphenhydramine Hcl Hives    Other reaction(s): Hives  . Bactrim [Sulfamethoxazole-Trimethoprim] Nausea And Vomiting  . Sulfamethoxazole-Trimethoprim Nausea And Vomiting    Other  reaction(s): Nausea And Vomiting    Past Medical History, Surgical history, Social history, and Family History were reviewed and updated.  Review of Systems: Review of Systems  Constitutional: Positive for malaise/fatigue.  HENT: Negative.   Eyes: Negative.   Respiratory: Negative.   Cardiovascular: Negative.   Gastrointestinal: Positive for abdominal pain, diarrhea and nausea.  Genitourinary: Negative.   Musculoskeletal: Negative.   Skin: Negative.   Neurological: Negative.   Endo/Heme/Allergies: Negative.   Psychiatric/Behavioral: Negative.      Physical Exam:  weight is 114 lb (51.7 kg). Her oral temperature is 98.2 F (36.8 C). Her blood pressure is 185/87 (abnormal) and her pulse is 57 (abnormal). Her respiration is 18 and oxygen saturation is 98%.   Wt Readings from Last 3 Encounters:  12/31/17 114 lb (51.7 kg)  12/05/17 115 lb 6.4 oz (52.3 kg)  11/07/17 116 lb (52.6 kg)    Physical Exam  Constitutional: She is oriented to person, place, and time.  HENT:  Head: Normocephalic and atraumatic.  Mouth/Throat: Oropharynx is clear and moist.  Eyes: Pupils are equal, round, and reactive to light. EOM are normal.  Neck: Normal range of motion.  Cardiovascular: Normal rate, regular rhythm and normal heart sounds.  Pulmonary/Chest: Effort normal and breath sounds normal.  Abdominal: Soft. Bowel sounds are normal.  Musculoskeletal: Normal range of motion. She exhibits no edema, tenderness or deformity.  Lymphadenopathy:    She has no cervical adenopathy.  Neurological: She is alert and oriented to person, place, and time.  Skin: Skin is warm and dry. No rash noted. No erythema.  Psychiatric: She has a normal mood and affect. Her behavior is normal. Judgment and thought content normal.  Vitals reviewed.   Lab Results  Component Value Date   WBC 6.4 12/31/2017   HGB 13.5 12/31/2017   HCT 42.9 12/31/2017   MCV 90.9 12/31/2017   PLT 281 12/31/2017   Lab Results    Component Value Date   FERRITIN 24 10/15/2017   IRON 72 10/15/2017   TIBC 463 (H) 10/15/2017   UIBC 390 10/15/2017   IRONPCTSAT 16 (L) 10/15/2017   Lab Results  Component Value Date   RETICCTPCT 1.2 08/03/2010   RBC 4.72 12/31/2017   RETICCTABS 61.3 08/03/2010   No results found for: KPAFRELGTCHN, LAMBDASER, KAPLAMBRATIO No results found for: IGGSERUM, IGA, IGMSERUM No results found for: Ronnald Ramp, A1GS, A2GS, Tillman Sers, SPEI   Chemistry      Component Value Date/Time   NA 136 12/31/2017 1356   NA 131 (L) 10/30/2017 1005   NA 139 01/03/2017 1327   NA 136 12/28/2015 1311  K 4.3 12/31/2017 1356   K 3.7 01/03/2017 1327   K 4.9 12/28/2015 1311   CL 98 12/31/2017 1356   CL 94 (L) 01/03/2017 1327   CO2 29 12/31/2017 1356   CO2 31 01/03/2017 1327   CO2 28 12/28/2015 1311   BUN 11 12/31/2017 1356   BUN 11 10/30/2017 1005   BUN 12 01/03/2017 1327   BUN 10.4 12/28/2015 1311   CREATININE 0.70 12/31/2017 1356   CREATININE 1.0 01/03/2017 1327   CREATININE 0.9 12/28/2015 1311      Component Value Date/Time   CALCIUM 9.1 12/31/2017 1356   CALCIUM 8.8 01/03/2017 1327   CALCIUM 9.6 12/28/2015 1311   ALKPHOS 83 12/31/2017 1356   ALKPHOS 85 (H) 01/03/2017 1327   ALKPHOS 83 12/28/2015 1311   AST 30 12/31/2017 1356   AST 25 12/28/2015 1311   ALT 18 12/31/2017 1356   ALT 19 01/03/2017 1327   ALT 13 12/28/2015 1311   BILITOT 0.7 12/31/2017 1356   BILITOT 0.74 12/28/2015 1311     Impression and Plan: Ms. Bronaugh is a 67 yo white female with polycythemia.   We do not had to phlebotomize her today.  As such, we will plan to get her back in about 6 weeks.  Hopefully, she will start to gain a little bit more weight.  I think this will happen once she stops smoking.   [name], MD 11/11/20193:40 PM

## 2018-01-01 LAB — IRON AND TIBC
IRON: 43 ug/dL (ref 41–142)
Saturation Ratios: 9 % — ABNORMAL LOW (ref 21–57)
TIBC: 469 ug/dL — ABNORMAL HIGH (ref 236–444)
UIBC: 426 ug/dL — AB (ref 120–384)

## 2018-01-01 LAB — FERRITIN: Ferritin: 11 ng/mL (ref 11–307)

## 2018-01-03 DIAGNOSIS — Z23 Encounter for immunization: Secondary | ICD-10-CM | POA: Diagnosis not present

## 2018-01-06 NOTE — Progress Notes (Signed)
Cardiology Office Note:    Date:  01/07/2018   ID:  [name], DOB October 17, 1950, MRN [id number]  PCP:  Wenda Low, MD  Cardiologist:  Sinclair Grooms, MD   Referring MD: Wenda Low, MD   Chief Complaint  Patient presents with  . Coronary Artery Disease    History of Present Illness:    [name] is a 67 y.o. female with a hx of hypertension, CADs/p stent 2010, left carotid endarterectomy 07/2016, right carotid endarterectomy 2007,polycythemia vera, tobacco abuse,COPD, GERD and anxiety. Polycythemia vera and hepatitis C followed by Dr. Marin Olp, hematologywith intermittent phlebotomy.  She has had no recurrence of chest pain.  She is still not taken her medications in the morning as recommended.  She is still smoking cigarettes.  She is not following a low-salt diet.  Last medication was around noontime which included Toprol but she only took 50 mg.  No lower extremity.  No neurological complaints.    Past Medical History:  Diagnosis Date  . Anxiety   . Arthritis   . CAD (coronary artery disease)   . COPD (chronic obstructive pulmonary disease) ([place])   . Depression   . Dyspnea   . GERD (gastroesophageal reflux disease)   . Hepatitis C   . Hypertension   . Peripheral vascular disease ([place])   . Polycythemia 2009  . Seizures ([place]) 1970   x1- pt. reports that there was never any causative agent      Past Surgical History:  Procedure Laterality Date  . 2 stints  Aug 26,2010   Dr. Tawnya Crook  . ABDOMINAL SURGERY  1970's   for excessive bleeding from loss of pregnancy, required blood transfusion post procedure   . cartoid endartherectomy  2007  . CORONARY ANGIOPLASTY WITH STENT PLACEMENT  10/09/2008  . ENDARTERECTOMY Left 07/24/2016   Procedure: ENDARTERECTOMY CAROTID;  Surgeon: [name], MD;  Location: [facility];  Service: Vascular;  Laterality: Left;  . FLEXIBLE SIGMOIDOSCOPY N/A 03/22/2017   Procedure: FLEXIBLE SIGMOIDOSCOPY;  Surgeon: Carol Ada, MD;  Location: [facility];  Service: Endoscopy;  Laterality: N/A;  . LYMPH NODE DISSECTION Left 1990's   benign   . TUBAL LIGATION      Current Medications: Current Meds  Medication Sig  . acetaminophen (TYLENOL) 500 MG tablet Take 500-1,000 mg by mouth every 6 (six) hours as needed for mild pain or headache (depends on pain if takes 1-2 tablets).   Marland Kitchen albuterol (PROVENTIL HFA;VENTOLIN HFA) 108 (90 BASE) MCG/ACT inhaler Inhale 2 puffs into the lungs every 6 (six) hours as needed for wheezing or shortness of breath.  . ALPRAZolam (XANAX) 0.5 MG tablet Take 0.5 mg by mouth at bedtime.   . Ascorbic Acid (VITAMIN C PO) Take 2 tablets by mouth daily.  . Biotin 1000 MCG tablet Take 1,000 mcg by mouth daily.  . clopidogrel (PLAVIX) 75 MG tablet Take 1 tablet (75 mg total) by mouth daily.  . clotrimazole-betamethasone (LOTRISONE) cream use 1 application to affected area as needed for sore areas on body  . escitalopram (LEXAPRO) 20 MG tablet Take 20 mg by mouth daily.  . hydrochlorothiazide (MICROZIDE) 12.5 MG capsule Take 1 capsule (12.5 mg total) by mouth daily.  Marland Kitchen Hyoscyamine Sulfate (HYOSCYAMINE PO) Take by mouth.  . lansoprazole (PREVACID) 30 MG capsule Take 30 mg by mouth daily as needed (for ulcer/acid reflex). Ulcer/ acid reflux  . metoprolol succinate (TOPROL-XL) 100 MG 24 hr tablet Take 1  & 1/2 (one & one-half)  tablets by mouth daily.  . nitroGLYCERIN (NITROSTAT) 0.4 MG SL tablet Place 1 tablet (0.4 mg total) under the tongue every 5 (five) minutes as needed for chest pain.  . simvastatin (ZOCOR) 10 MG tablet Take 1 tablet (10 mg total) by mouth at bedtime.  Marland Kitchen telmisartan (MICARDIS) 20 MG tablet Take 1 tablet (20 mg total) by mouth daily.  . vitamin E 400 UNIT capsule Take 400 Units by mouth daily.  . [DISCONTINUED] metoprolol succinate (TOPROL-XL) 100 MG 24 hr tablet Take 1  & 1/2 (one & one-half) tablets by mouth daily.     Allergies:   Diphenhydramine hcl; Bactrim  [sulfamethoxazole-trimethoprim]; and Sulfamethoxazole-trimethoprim   Social History   Socioeconomic History  . Marital status: Single    Spouse name: Not on file  . Number of children: Not on file  . Years of education: Not on file  . Highest education level: Not on file  Occupational History  . Not on file  Social Needs  . Financial resource strain: Not on file  . Food insecurity:    Worry: Not on file    Inability: Not on file  . Transportation needs:    Medical: Not on file    Non-medical: Not on file  Tobacco Use  . Smoking status: Former Smoker    Packs/day: 0.25    Years: 48.00    Pack years: 12.00    Types: Cigarettes    Start date: 03/20/1968  . Smokeless tobacco: Never Used  . Tobacco comment: "1 pk lasts about 3 days", uses VAPE, uses nicorette patch   Substance and Sexual Activity  . Alcohol use: Yes    Alcohol/week: 0.0 standard drinks    Comment: social- 2-3 x's per month   . Drug use: No    Comment: has smoked cigarettes off and on  . Sexual activity: Not Currently  Lifestyle  . Physical activity:    Days per week: Not on file    Minutes per session: Not on file  . Stress: Not on file  Relationships  . Social connections:    Talks on phone: Not on file    Gets together: Not on file    Attends religious service: Not on file    Active member of club or organization: Not on file    Attends meetings of clubs or organizations: Not on file    Relationship status: Not on file  Other Topics Concern  . Not on file  Social History Narrative   Tobacco use cigarettes: Current smoker   Frequency 1 PPD   Estimated Pack - years :30   Smoking : yes    No Tobacco exposure   No alcohol   No exercise   Occupation : employed, works at home for [employer], Press photographer   Children Boys 1 Girls 1     Family History: The patient's family history includes Heart disease in her father and mother.  ROS:   Please see the history of  present illness.    Still smoking cigarettes.  Not compliant with taking medications on schedule as prescribed.  Other complaints could include decreased hearing, change in appetite, weight loss, fatigue, dizziness, and easy bruising.  All other systems reviewed and are negative.  EKGs/Labs/Other Studies Reviewed:    The following studies were reviewed today: No new data.  Stress test was reviewed on last visit.  EKG:  EKG is not ordered today.    Recent Labs: 12/31/2017: ALT 18; BUN  11; Creatinine 0.70; Hemoglobin 13.5; Platelet Count 281; Potassium 4.3; Sodium 136  Recent Lipid Panel    Component Value Date/Time   CHOL 142 12/04/2017 1203   TRIG 95 12/04/2017 1203   HDL 62 12/04/2017 1203   CHOLHDL 2.3 12/04/2017 1203   CHOLHDL 5.1 (H) 09/16/2014 1322   VLDL 37 (H) 09/16/2014 1322   LDLCALC 61 12/04/2017 1203    Physical Exam:    VS:  BP (!) 184/88   Pulse 60   Ht 5' (1.524 m)   Wt 112 lb 1.9 oz (50.9 kg)   SpO2 94%   BMI 21.90 kg/m     Wt Readings from Last 3 Encounters:  01/07/18 112 lb 1.9 oz (50.9 kg)  12/31/17 114 lb (51.7 kg)  12/05/17 115 lb 6.4 oz (52.3 kg)     GEN:  Well nourished, well developed in no acute distress HEENT: Normal NECK: No JVD. LYMPHATICS: No lymphadenopathy CARDIAC: RRR, no murmur, no gallop, no edema. VASCULAR: 2+ bilateral carotid and radial pulses.  No bruits. RESPIRATORY:  Clear to auscultation without rales, wheezing or rhonchi  ABDOMEN: Soft, non-tender, non-distended, No pulsatile mass, MUSCULOSKELETAL: No deformity  SKIN: Warm and dry NEUROLOGIC:  Alert and oriented x 3 PSYCHIATRIC:  Normal affect   ASSESSMENT:    1. Essential hypertension   2. Tobacco abuse   3. Coronary artery disease involving native coronary artery of native heart with angina pectoris ([place])   4. Hyperlipidemia LDL goal <70   5. Stable angina pectoris ([place])   6. COPD exacerbation ([place])   7. Polycythemia vera ([place])    PLAN:    In order of problems  listed above:  1. Pressure still poorly controlled.  Repeat blood pressure left arm where it tends to run highest, is 190/89 mmHg.  Motion right arm 158/80 mmHg.  This may suggest the possibility of a right subclavian or innominate artery obstruction.  Add amlodipine 5 mg/day.  Advised to take her antihypertensive therapy first thing in the morning.  She will take hydrochlorothiazide and 150 mg of Toprol-XL the very first thing in the morning.  Perhaps 2 hours later she should take Micardis and amlodipine.  Blood pressure clinic follow-up with an afternoon OV in 2 to 4 weeks.  May need to add Spironolactone.  If blood pressure is not better controlled may need to look for renal artery stenosis. 2. Again a long discussion concerning smoking cessation.  She is not willing to commit. 3. Low risk nuclear study without recurrent angina.  No further evaluation at this time. 4. Target LDL is less than 70.  Recently documented to be 93. 5. Stable without recent angina  Clinical follow-up with me in 6 to 8 months.  May need renal Doppler study performed to rule out renal artery stenosis.  I encouraged her to decrease salt in the diet, take medications early in the morning as noted above, and to follow-up in blood pressure clinic with appointment scheduled to be 3 to 6 hours after her a.m. Medications.   Acute cessation and salt restriction discussed.   Medication Adjustments/Labs and Tests Ordered: Current medicines are reviewed at length with the patient today.  Concerns regarding medicines are outlined above.  No orders of the defined types were placed in this encounter.  Meds ordered this encounter  Medications  . metoprolol succinate (TOPROL-XL) 100 MG 24 hr tablet    Sig: Take 1  & 1/2 (one & one-half) tablets by mouth daily.    Dispense:  135 tablet    Refill:  3  . amLODipine (NORVASC) 5 MG tablet    Sig: Take 1 tablet (5 mg total) by mouth daily.    Dispense:  180 tablet    Refill:  3     There are no Patient Instructions on file for this visit.   Signed, Sinclair Grooms, MD  01/07/2018 4:36 PM    [facility]

## 2018-01-07 ENCOUNTER — Ambulatory Visit: Payer: PPO | Admitting: Interventional Cardiology

## 2018-01-07 ENCOUNTER — Encounter: Payer: Self-pay | Admitting: Interventional Cardiology

## 2018-01-07 VITALS — BP 184/88 | HR 60 | Ht 60.0 in | Wt 112.1 lb

## 2018-01-07 DIAGNOSIS — D45 Polycythemia vera: Secondary | ICD-10-CM | POA: Diagnosis not present

## 2018-01-07 DIAGNOSIS — J441 Chronic obstructive pulmonary disease with (acute) exacerbation: Secondary | ICD-10-CM

## 2018-01-07 DIAGNOSIS — I25119 Atherosclerotic heart disease of native coronary artery with unspecified angina pectoris: Secondary | ICD-10-CM | POA: Diagnosis not present

## 2018-01-07 DIAGNOSIS — I208 Other forms of angina pectoris: Secondary | ICD-10-CM

## 2018-01-07 DIAGNOSIS — Z720 Tobacco use: Secondary | ICD-10-CM | POA: Diagnosis not present

## 2018-01-07 DIAGNOSIS — E785 Hyperlipidemia, unspecified: Secondary | ICD-10-CM | POA: Diagnosis not present

## 2018-01-07 DIAGNOSIS — I10 Essential (primary) hypertension: Secondary | ICD-10-CM | POA: Diagnosis not present

## 2018-01-07 MED ORDER — METOPROLOL SUCCINATE ER 100 MG PO TB24: ORAL_TABLET | ORAL | 3 refills | Status: DC

## 2018-01-07 MED ORDER — AMLODIPINE BESYLATE 5 MG PO TABS: 5.0000 mg | ORAL_TABLET | Freq: Every day | ORAL | 3 refills | Status: DC

## 2018-01-07 NOTE — Patient Instructions (Signed)
Medication Instructions:  Your physician has recommended you make the following change in your medication:  1.) start amlodipine 5 mg once a day  If you need a refill on your cardiac medications before your next appointment, please call your pharmacy.   Lab work: none If you have labs (blood work) drawn today and your tests are completely normal, you will receive your results only by: Marland Kitchen MyChart Message (if you have MyChart) OR . A paper copy in the mail If you have any lab test that is abnormal or we need to change your treatment, we will call you to review the results.  Testing/Procedures: none  Follow-Up: Your physician recommends that you schedule a follow-up appointment in: 2-4 weeks in hypertension clinic Your physician recommends that you schedule a follow-up appointment in: 4-6 months with [name].    Any Other Special Instructions Will Be Listed Below (If Applicable). Take all of your blood pressure medications in the morning.   You can take Toprol and hctz very first thing in AM and take amlodipine and mycardis right after breakfast

## 2018-01-24 ENCOUNTER — Ambulatory Visit (INDEPENDENT_AMBULATORY_CARE_PROVIDER_SITE_OTHER): Payer: PPO | Admitting: Pharmacist

## 2018-01-24 VITALS — BP 160/70 | HR 62

## 2018-01-24 DIAGNOSIS — Z720 Tobacco use: Secondary | ICD-10-CM | POA: Diagnosis not present

## 2018-01-24 DIAGNOSIS — I10 Essential (primary) hypertension: Secondary | ICD-10-CM | POA: Diagnosis not present

## 2018-01-24 NOTE — Progress Notes (Signed)
Patient ID: [name]                 DOB: Dec 24, 1950                      MRN: [id number]     HPI: [name] is a 67 y.o. female referred by [name] to HTN clinic. PMH is significant for CAD s/p stent in 2010, left CEA in 2018 and right CEA in 2007, HTN, polycythemia vera, Hepatitis C, tobacco abuse, COPD, GERD, and anxiety. Pt was seen by Dr Tamala Julian 2 weeks ago and BP was elevated at 184/88. She was still smoking at that time and not following a low sodium diet. She was started on amlodipine 5mg  daily and advised to take her BP medications in the morning.  Pt presents today in good spirits. She struggles with taking her medications each day on a regular basis and often misses doses. She cares for her 43 year old mother who has dementia full time and finds it hard to take the time to care for herself since she is always focusing on her mother. Her niece is also currently living with her. She is under significant stress because of this as well. She does not monitor her BP at home because she is afraid to. She does note that she has headaches on days where she forgets to take her BP medication. She has most recently been taking her medications around 2pm. She does not use NSAIDs. BP readings today are consistent between arms unlike at last visit with Dr Tamala Julian.  Is still smoking cigarettes, understands she needs to quit. Has previously quit for 1 year and 4 year periods of time. Nicotine patches and gum helped her. She is currently using nicotine gum and does not smoke around her mother. Her niece does smoke which makes cutting back difficult.  Current HTN meds: amlodipine 5mg  daily, HCTZ 12.5mg  daily, Toprol 150mg  daily, telmisartan 20mg  daily  BP goal: <130/58mmHg  Family History: The patient's family history includes Heart disease in her father and mother.  Social History: Tobacco abuse - 1 pack lasts about 3 days. Drinks alcohol occasionally, denies illicit drug use.  Diet: 2  cups of coffee - 1/2 decaf, decaf tea. Does not add salt to food.  Exercise: Not too much but does care ofr her mother full time.  Home BP readings: Does not monitor.  Wt Readings from Last 3 Encounters:  01/07/18 112 lb 1.9 oz (50.9 kg)  12/31/17 114 lb (51.7 kg)  12/05/17 115 lb 6.4 oz (52.3 kg)   BP Readings from Last 3 Encounters:  01/07/18 (!) 184/88  12/31/17 (!) 185/87  12/05/17 (!) 150/64   Pulse Readings from Last 3 Encounters:  01/07/18 60  12/31/17 (!) 57  12/05/17 61    Renal function: CrCl cannot be calculated (Patient's most recent lab result is older than the maximum 21 days allowed.).  Past Medical History:  Diagnosis Date  . Anxiety   . Arthritis   . CAD (coronary artery disease)   . COPD (chronic obstructive pulmonary disease) ([place])   . Depression   . Dyspnea   . GERD (gastroesophageal reflux disease)   . Hepatitis C   . Hypertension   . Peripheral vascular disease ([place])   . Polycythemia 2009  . Seizures ([place]) 1970   x1- pt. reports that there was never any causative agent      Current Outpatient Medications on File Prior to  Visit  Medication Sig Dispense Refill  . acetaminophen (TYLENOL) 500 MG tablet Take 500-1,000 mg by mouth every 6 (six) hours as needed for mild pain or headache (depends on pain if takes 1-2 tablets).     Marland Kitchen albuterol (PROVENTIL HFA;VENTOLIN HFA) 108 (90 BASE) MCG/ACT inhaler Inhale 2 puffs into the lungs every 6 (six) hours as needed for wheezing or shortness of breath. 1 Inhaler 2  . ALPRAZolam (XANAX) 0.5 MG tablet Take 0.5 mg by mouth at bedtime.     Marland Kitchen amLODipine (NORVASC) 5 MG tablet Take 1 tablet (5 mg total) by mouth daily. 180 tablet 3  . Ascorbic Acid (VITAMIN C PO) Take 2 tablets by mouth daily.    . Biotin 1000 MCG tablet Take 1,000 mcg by mouth daily.    . clopidogrel (PLAVIX) 75 MG tablet Take 1 tablet (75 mg total) by mouth daily. 90 tablet 3  . clotrimazole-betamethasone (LOTRISONE) cream use 1 application to  affected area as needed for sore areas on body    . escitalopram (LEXAPRO) 20 MG tablet Take 20 mg by mouth daily.    . hydrochlorothiazide (MICROZIDE) 12.5 MG capsule Take 1 capsule (12.5 mg total) by mouth daily. 45 capsule 3  . Hyoscyamine Sulfate (HYOSCYAMINE PO) Take by mouth.    . lansoprazole (PREVACID) 30 MG capsule Take 30 mg by mouth daily as needed (for ulcer/acid reflex). Ulcer/ acid reflux    . metoprolol succinate (TOPROL-XL) 100 MG 24 hr tablet Take 1  & 1/2 (one & one-half) tablets by mouth daily. 135 tablet 3  . nitroGLYCERIN (NITROSTAT) 0.4 MG SL tablet Place 1 tablet (0.4 mg total) under the tongue every 5 (five) minutes as needed for chest pain. 25 tablet 3  . simvastatin (ZOCOR) 10 MG tablet Take 1 tablet (10 mg total) by mouth at bedtime. 90 tablet 3  . telmisartan (MICARDIS) 20 MG tablet Take 1 tablet (20 mg total) by mouth daily. 90 tablet 3  . vitamin E 400 UNIT capsule Take 400 Units by mouth daily.     No current facility-administered medications on file prior to visit.     Allergies  Allergen Reactions  . Diphenhydramine Hcl Hives    Other reaction(s): Hives  . Bactrim [Sulfamethoxazole-Trimethoprim] Nausea And Vomiting  . Sulfamethoxazole-Trimethoprim Nausea And Vomiting    Other reaction(s): Nausea And Vomiting     Assessment/Plan:  1. Hypertension - BP improved although remains above goal <130/3mmHg with medication noncompliance as a contributing factor. Will increase amlodipine to 10mg  daily. Pt instructed to take HCTZ 12.5mg  and amlodipine 10mg  in the morning when she wakes up and Toprol 150mg  and telmisartan 20mg  in the evening before she goes to bed. Discussed using a pill box to help improve compliance as well. Will f/u in HTN clinic in 3 weeks for BP check.  2. Tobacco abuse - Discussed benefits of quitting smoking. Encouraged pt to utilize nicotine gum more frequently to help with cravings as well as purchase OTC nicotine patches since this  previously helped her to quit smoking.  Megan E. Supple, PharmD, BCACP, [facility] [address] [place], [place] [address] Phone: [phone]; Fax: [phone] 01/24/2018 4:12 PM

## 2018-01-24 NOTE — Patient Instructions (Addendum)
Increase your amlodipine from 5mg  to 10mg  once a day  Use your pill box at home  Morning medications: -Hydrochlorothiazide 12.5mg  -Amlodipine 10mg  (take 2 of the 5mg  tablets) -Plavix 75mg   Evening medications: -Telmisartan 20mg  -Metoprolol 150mg  -Simvastatin 10mg   Follow up in clinic in 3 weeks for a blood pressure check and lab work

## 2018-02-11 ENCOUNTER — Ambulatory Visit: Payer: PPO | Admitting: Hematology & Oncology

## 2018-02-11 ENCOUNTER — Other Ambulatory Visit: Payer: PPO

## 2018-02-14 NOTE — Progress Notes (Deleted)
Patient ID: [name]                 DOB: 10/24/1950                      MRN: [id number]     HPI: [name] is a 67 y.o. female referred by [name] to HTN clinic. PMH is significant for CAD s/p stent in 2010, left CEA in 2018 and right CEA in 2007, HTN, polycythemia vera, Hepatitis C, tobacco abuse, COPD, GERD, and anxiety. Pt was seen by Dr Tamala Julian in November ago and BP was elevated at 184/88. She was still smoking at that time and not following a low sodium diet. She was started on amlodipine 5mg  daily and medication compliance was stressed. She was seen in HTN clinic 3 weeks ago and amlodipine was further titrated to 10mg  daily.  HCTZ 12.5mg  and amlodipine 10mg  - AM Toprol 150mg  and telmisartan 20mg  - bedtime Using pill box to help with compliance? Any missed doses of meds? Purchase any nicotine gum? Check BP in each arm Inc HCTZ or telmisartan if needed (max 80mg )  Pt presents today in good spirits. She struggles with taking her medications each day on a regular basis and often misses doses. She cares for her 70 year old mother who has dementia full time and finds it hard to take the time to care for herself since she is always focusing on her mother. Her niece is also currently living with her. She is under significant stress because of this as well. She does not monitor her BP at home because she is afraid to. She does note that she has headaches on days where she forgets to take her BP medication. She has most recently been taking her medications around 2pm. She does not use NSAIDs.  Is still smoking cigarettes, understands she needs to quit. Has previously quit for 1 year and 4 year periods of time. Nicotine patches and gum helped her. She is currently using nicotine gum and does not smoke around her mother. Her niece does smoke which makes cutting back difficult.  Current HTN meds: amlodipine 10mg  daily, HCTZ 12.5mg  daily, Toprol 150mg  daily, telmisartan 20mg   daily  BP goal: <130/45mmHg  Family History: The patient's family history includes Heart disease in her father and mother.  Social History: Tobacco abuse - 1 pack lasts about 3 days. Drinks alcohol occasionally, denies illicit drug use.  Diet: 2 cups of coffee - 1/2 decaf, decaf tea. Does not add salt to food.  Exercise: Not too much but does care ofr her mother full time.  Home BP readings: Does not monitor.  Wt Readings from Last 3 Encounters:  01/07/18 112 lb 1.9 oz (50.9 kg)  12/31/17 114 lb (51.7 kg)  12/05/17 115 lb 6.4 oz (52.3 kg)   BP Readings from Last 3 Encounters:  01/24/18 (!) 160/70  01/07/18 (!) 184/88  12/31/17 (!) 185/87   Pulse Readings from Last 3 Encounters:  01/24/18 62  01/07/18 60  12/31/17 (!) 57    Renal function: CrCl cannot be calculated (Patient's most recent lab result is older than the maximum 21 days allowed.).  Past Medical History:  Diagnosis Date  . Anxiety   . Arthritis   . CAD (coronary artery disease)   . COPD (chronic obstructive pulmonary disease) ([place])   . Depression   . Dyspnea   . GERD (gastroesophageal reflux disease)   . Hepatitis C   .  Hypertension   . Peripheral vascular disease ([place])   . Polycythemia 2009  . Seizures ([place]) 1970   x1- pt. reports that there was never any causative agent      Current Outpatient Medications on File Prior to Visit  Medication Sig Dispense Refill  . acetaminophen (TYLENOL) 500 MG tablet Take 500-1,000 mg by mouth every 6 (six) hours as needed for mild pain or headache (depends on pain if takes 1-2 tablets).     Marland Kitchen albuterol (PROVENTIL HFA;VENTOLIN HFA) 108 (90 BASE) MCG/ACT inhaler Inhale 2 puffs into the lungs every 6 (six) hours as needed for wheezing or shortness of breath. 1 Inhaler 2  . ALPRAZolam (XANAX) 0.5 MG tablet Take 0.5 mg by mouth at bedtime.     Marland Kitchen amLODipine (NORVASC) 5 MG tablet Take 2 tablets (10 mg total) by mouth daily. 180 tablet 3  . Ascorbic Acid (VITAMIN C PO) Take  2 tablets by mouth daily.    . Biotin 1000 MCG tablet Take 1,000 mcg by mouth daily.    . clopidogrel (PLAVIX) 75 MG tablet Take 1 tablet (75 mg total) by mouth daily. 90 tablet 3  . clotrimazole-betamethasone (LOTRISONE) cream use 1 application to affected area as needed for sore areas on body    . escitalopram (LEXAPRO) 20 MG tablet Take 20 mg by mouth daily.    . hydrochlorothiazide (MICROZIDE) 12.5 MG capsule Take 1 capsule (12.5 mg total) by mouth daily. 45 capsule 3  . Hyoscyamine Sulfate (HYOSCYAMINE PO) Take by mouth.    . lansoprazole (PREVACID) 30 MG capsule Take 30 mg by mouth daily as needed (for ulcer/acid reflex). Ulcer/ acid reflux    . metoprolol succinate (TOPROL-XL) 100 MG 24 hr tablet Take 1  & 1/2 (one & one-half) tablets by mouth daily. 135 tablet 3  . nitroGLYCERIN (NITROSTAT) 0.4 MG SL tablet Place 1 tablet (0.4 mg total) under the tongue every 5 (five) minutes as needed for chest pain. 25 tablet 3  . simvastatin (ZOCOR) 10 MG tablet Take 1 tablet (10 mg total) by mouth at bedtime. 90 tablet 3  . telmisartan (MICARDIS) 20 MG tablet Take 1 tablet (20 mg total) by mouth daily. 90 tablet 3  . vitamin E 400 UNIT capsule Take 400 Units by mouth daily.     No current facility-administered medications on file prior to visit.     Allergies  Allergen Reactions  . Diphenhydramine Hcl Hives    Other reaction(s): Hives  . Bactrim [Sulfamethoxazole-Trimethoprim] Nausea And Vomiting  . Sulfamethoxazole-Trimethoprim Nausea And Vomiting    Other reaction(s): Nausea And Vomiting     Assessment/Plan:  1. Hypertension - BP improved although remains above goal <130/62mmHg with medication noncompliance as a contributing factor. Will increase amlodipine to 10mg  daily. Pt instructed to take HCTZ 12.5mg  and amlodipine 10mg  in the morning when she wakes up and Toprol 150mg  and telmisartan 20mg  in the evening before she goes to bed. Discussed using a pill box to help improve compliance as  well. Will f/u in HTN clinic in 3 weeks for BP check.  2. Tobacco abuse - Discussed benefits of quitting smoking. Encouraged pt to utilize nicotine gum more frequently to help with cravings as well as purchase OTC nicotine patches since this previously helped her to quit smoking.  Megan E. Supple, PharmD, BCACP, [facility] [address] [place], [place] [address] Phone: [phone]; Fax: [phone] 02/14/2018 8:04 AM

## 2018-02-15 ENCOUNTER — Ambulatory Visit: Payer: PPO

## 2018-02-28 ENCOUNTER — Ambulatory Visit: Payer: PPO

## 2018-03-06 ENCOUNTER — Encounter: Payer: Self-pay | Admitting: Hematology & Oncology

## 2018-03-06 ENCOUNTER — Other Ambulatory Visit: Payer: Self-pay

## 2018-03-06 ENCOUNTER — Telehealth: Payer: Self-pay | Admitting: Hematology & Oncology

## 2018-03-06 ENCOUNTER — Ambulatory Visit: Payer: PPO

## 2018-03-06 ENCOUNTER — Inpatient Hospital Stay: Payer: PPO | Attending: Hematology & Oncology

## 2018-03-06 ENCOUNTER — Inpatient Hospital Stay (HOSPITAL_BASED_OUTPATIENT_CLINIC_OR_DEPARTMENT_OTHER): Payer: PPO | Admitting: Hematology & Oncology

## 2018-03-06 VITALS — BP 161/72 | HR 58 | Temp 98.5°F | Resp 19 | Wt 116.8 lb

## 2018-03-06 DIAGNOSIS — Z720 Tobacco use: Secondary | ICD-10-CM | POA: Diagnosis not present

## 2018-03-06 DIAGNOSIS — D751 Secondary polycythemia: Secondary | ICD-10-CM

## 2018-03-06 DIAGNOSIS — D45 Polycythemia vera: Secondary | ICD-10-CM

## 2018-03-06 LAB — CMP (CANCER CENTER ONLY)
ALT: 10 U/L (ref 0–44)
AST: 18 U/L (ref 15–41)
Albumin: 4.5 g/dL (ref 3.5–5.0)
Alkaline Phosphatase: 67 U/L (ref 38–126)
Anion gap: 6 (ref 5–15)
BUN: 15 mg/dL (ref 8–23)
CO2: 32 mmol/L (ref 22–32)
Calcium: 9.6 mg/dL (ref 8.9–10.3)
Chloride: 94 mmol/L — ABNORMAL LOW (ref 98–111)
Creatinine: 0.82 mg/dL (ref 0.44–1.00)
GFR, Est AFR Am: >60 mL/min (ref >60)
GFR, Estimated: >60 mL/min (ref >60)
Glucose, Bld: 114 mg/dL — ABNORMAL HIGH (ref 70–99)
Potassium: 4.6 mmol/L (ref 3.5–5.1)
SODIUM: 132 mmol/L — AB (ref 135–145)
Total Bilirubin: 0.5 mg/dL (ref 0.3–1.2)
Total Protein: 7.1 g/dL (ref 6.5–8.1)

## 2018-03-06 LAB — CBC WITH DIFFERENTIAL (CANCER CENTER ONLY)
ABS IMMATURE GRANULOCYTES: 0.02 10*3/uL (ref 0.00–0.07)
BASOS ABS: 0 10*3/uL (ref 0.0–0.1)
Basophils Relative: 1 %
Eosinophils Absolute: 0.1 10*3/uL (ref 0.0–0.5)
Eosinophils Relative: 1 %
HCT: 45.7 % (ref 36.0–46.0)
HEMOGLOBIN: 14.7 g/dL (ref 12.0–15.0)
IMMATURE GRANULOCYTES: 0 %
LYMPHS ABS: 1.8 10*3/uL (ref 0.7–4.0)
Lymphocytes Relative: 26 %
MCH: 29.2 pg (ref 26.0–34.0)
MCHC: 32.2 g/dL (ref 30.0–36.0)
MCV: 90.7 fL (ref 80.0–100.0)
MONO ABS: 0.7 10*3/uL (ref 0.1–1.0)
MONOS PCT: 10 %
NEUTROS PCT: 62 %
Neutro Abs: 4.3 10*3/uL (ref 1.7–7.7)
Platelet Count: 268 10*3/uL (ref 150–400)
RBC: 5.04 MIL/uL (ref 3.87–5.11)
RDW: 14.7 % (ref 11.5–15.5)
WBC Count: 6.9 10*3/uL (ref 4.0–10.5)
nRBC: 0 % (ref 0.0–0.2)

## 2018-03-06 NOTE — Telephone Encounter (Signed)
Appointments scheduled avs printed per 1/15 los

## 2018-03-06 NOTE — Progress Notes (Deleted)
Patient ID: [name]                 DOB: 1950-10-16                      MRN: [id number]     HPI: [name] is a 68 y.o. female referred by [name] to HTN clinic. PMH is significant for CAD s/p stent in 2010, left CEA in 2018 and right CEA in 2007, HTN, polycythemia vera, Hepatitis C, tobacco abuse, COPD, GERD, and anxiety.   At last visit patient reported being under a lot of stress due to taking care of her mother with dementia. Patient was still smoking, although she was using nicotine gum to help her quite. Compliance with medications had been an issue. Patient was encouraged to use a pill box and was advised to take her amlodipine and HCTZ in the AM and Toprol and telmisartan before bed. Her amlodipine was increased to 10mg  due to elevated blood pressure.   Current HTN meds: amlodipine 10mg  daily, HCTZ 12.5mg  daily, Toprol 150mg  daily, telmisartan 20mg  daily  BP goal: <130/46mmHg  Family History: The patient's family history includes Heart disease in her father and mother.  Social History: Tobacco abuse - 1 pack lasts about 3 days. Drinks alcohol occasionally, denies illicit drug use.  Diet: 2 cups of coffee - 1/2 decaf, decaf tea. Does not add salt to food.  Exercise: Not too much but does care ofr her mother full time.  Home BP readings: Does not monitor.  Wt Readings from Last 3 Encounters:  01/07/18 112 lb 1.9 oz (50.9 kg)  12/31/17 114 lb (51.7 kg)  12/05/17 115 lb 6.4 oz (52.3 kg)   BP Readings from Last 3 Encounters:  01/24/18 (!) 160/70  01/07/18 (!) 184/88  12/31/17 (!) 185/87   Pulse Readings from Last 3 Encounters:  01/24/18 62  01/07/18 60  12/31/17 (!) 57    Renal function: CrCl cannot be calculated (Patient's most recent lab result is older than the maximum 21 days allowed.).  Past Medical History:  Diagnosis Date  . Anxiety   . Arthritis   . CAD (coronary artery disease)   . COPD (chronic obstructive pulmonary disease) ([place])   .  Depression   . Dyspnea   . GERD (gastroesophageal reflux disease)   . Hepatitis C   . Hypertension   . Peripheral vascular disease ([place])   . Polycythemia 2009  . Seizures ([place]) 1970   x1- pt. reports that there was never any causative agent      Current Outpatient Medications on File Prior to Visit  Medication Sig Dispense Refill  . acetaminophen (TYLENOL) 500 MG tablet Take 500-1,000 mg by mouth every 6 (six) hours as needed for mild pain or headache (depends on pain if takes 1-2 tablets).     Marland Kitchen albuterol (PROVENTIL HFA;VENTOLIN HFA) 108 (90 BASE) MCG/ACT inhaler Inhale 2 puffs into the lungs every 6 (six) hours as needed for wheezing or shortness of breath. 1 Inhaler 2  . ALPRAZolam (XANAX) 0.5 MG tablet Take 0.5 mg by mouth at bedtime.     Marland Kitchen amLODipine (NORVASC) 5 MG tablet Take 2 tablets (10 mg total) by mouth daily. 180 tablet 3  . Ascorbic Acid (VITAMIN C PO) Take 2 tablets by mouth daily.    . Biotin 1000 MCG tablet Take 1,000 mcg by mouth daily.    . clopidogrel (PLAVIX) 75 MG tablet Take 1 tablet (75 mg  total) by mouth daily. 90 tablet 3  . clotrimazole-betamethasone (LOTRISONE) cream use 1 application to affected area as needed for sore areas on body    . escitalopram (LEXAPRO) 20 MG tablet Take 20 mg by mouth daily.    . hydrochlorothiazide (MICROZIDE) 12.5 MG capsule Take 1 capsule (12.5 mg total) by mouth daily. 45 capsule 3  . Hyoscyamine Sulfate (HYOSCYAMINE PO) Take by mouth.    . lansoprazole (PREVACID) 30 MG capsule Take 30 mg by mouth daily as needed (for ulcer/acid reflex). Ulcer/ acid reflux    . metoprolol succinate (TOPROL-XL) 100 MG 24 hr tablet Take 1  & 1/2 (one & one-half) tablets by mouth daily. 135 tablet 3  . nitroGLYCERIN (NITROSTAT) 0.4 MG SL tablet Place 1 tablet (0.4 mg total) under the tongue every 5 (five) minutes as needed for chest pain. 25 tablet 3  . simvastatin (ZOCOR) 10 MG tablet Take 1 tablet (10 mg total) by mouth at bedtime. 90 tablet 3  .  telmisartan (MICARDIS) 20 MG tablet Take 1 tablet (20 mg total) by mouth daily. 90 tablet 3  . vitamin E 400 UNIT capsule Take 400 Units by mouth daily.     No current facility-administered medications on file prior to visit.     Allergies  Allergen Reactions  . Diphenhydramine Hcl Hives    Other reaction(s): Hives  . Bactrim [Sulfamethoxazole-Trimethoprim] Nausea And Vomiting  . Sulfamethoxazole-Trimethoprim Nausea And Vomiting    Other reaction(s): Nausea And Vomiting     Assessment/Plan:  1. Hypertension -   2. Tobacco abuse -   Jaythen Hamme D Cleotha Tsang, Pharm.D, [facility]  [address] [place], [place] [address]  Phone: [phone]; Fax: [phone]  03/06/2018 9:02 AM

## 2018-03-06 NOTE — Progress Notes (Signed)
Hematology and Oncology Follow Up Visit  [name] [id number] 1950/04/26 68 y.o. 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  She is doing okay.  She got through the holidays without a lot of problems.  Her elderly mother is not doing well.  She is 45 years old and is starting to have problems.  Sounds like she has congestive heart failure.  [name] is not working.  She is still smoking.  Her weight is holding stable which is encouraging.  She has not been phlebotomized for about 9 months.  She probably will need to be phlebotomized today.  She has had no fever.  She has had no change in bowel or bladder habits.  She has had no rashes.  There has been no pain.  Overall, her performance status is ECOG 1.    Medications:  Allergies as of 03/06/2018      Reactions   Diphenhydramine Hcl Hives   Other reaction(s): Hives   Bactrim [sulfamethoxazole-trimethoprim] Nausea And Vomiting   Sulfamethoxazole-trimethoprim Nausea And Vomiting   Other reaction(s): Nausea And Vomiting      Medication List       Accurate as of March 06, 2018  1:06 PM. Always use your most recent med list.        acetaminophen 500 MG tablet Commonly known as:  TYLENOL Take 500-1,000 mg by mouth every 6 (six) hours as needed for mild pain or headache (depends on pain if takes 1-2 tablets).   albuterol 108 (90 Base) MCG/ACT inhaler Commonly known as:  PROVENTIL HFA;VENTOLIN HFA Inhale 2 puffs into the lungs every 6 (six) hours as needed for wheezing or shortness of breath.   ALPRAZolam 0.5 MG tablet Commonly known as:  XANAX Take 0.5 mg by mouth at bedtime.   amLODipine 5 MG tablet Commonly known as:   NORVASC Take 2 tablets (10 mg total) by mouth daily.   Biotin 1000 MCG tablet Take 1,000 mcg by mouth daily.   clopidogrel 75 MG tablet Commonly known as:  PLAVIX Take 1 tablet (75 mg total) by mouth daily.   clotrimazole-betamethasone cream Commonly known as:  LOTRISONE use 1 application to affected area as needed for sore areas on body   escitalopram 20 MG tablet Commonly known as:  LEXAPRO Take 20 mg by mouth daily.   hydrochlorothiazide 12.5 MG capsule Commonly known as:  MICROZIDE Take 1 capsule (12.5 mg total) by mouth daily.   HYOSCYAMINE PO Take by mouth.   lansoprazole 30 MG capsule Commonly known as:  PREVACID Take 30 mg by mouth daily as needed (for ulcer/acid reflex). Ulcer/ acid reflux   metoprolol succinate 100 MG 24 hr tablet Commonly known as:  TOPROL-XL Take 1  & 1/2 (one & one-half) tablets by mouth daily.   nitroGLYCERIN 0.4 MG SL tablet Commonly known as:  NITROSTAT Place 1 tablet (0.4 mg total) under the tongue every 5 (five) minutes as needed for chest pain.   simvastatin 10 MG tablet Commonly known as:  ZOCOR Take 1 tablet (10 mg total) by mouth at bedtime.   telmisartan 20 MG tablet Commonly known as:  MICARDIS Take 1 tablet (20 mg total) by  mouth daily.   VITAMIN C PO Take 2 tablets by mouth daily.   vitamin E 400 UNIT capsule Take 400 Units by mouth daily.       Allergies:  Allergies  Allergen Reactions  . Diphenhydramine Hcl Hives    Other reaction(s): Hives  . Bactrim [Sulfamethoxazole-Trimethoprim] Nausea And Vomiting  . Sulfamethoxazole-Trimethoprim Nausea And Vomiting    Other reaction(s): Nausea And Vomiting    Past Medical History, Surgical history, Social history, and Family History were reviewed and updated.  Review of Systems: Review of Systems  Constitutional: Positive for malaise/fatigue.  HENT: Negative.   Eyes: Negative.   Respiratory: Negative.   Cardiovascular: Negative.   Gastrointestinal: Positive for  abdominal pain, diarrhea and nausea.  Genitourinary: Negative.   Musculoskeletal: Negative.   Skin: Negative.   Neurological: Negative.   Endo/Heme/Allergies: Negative.   Psychiatric/Behavioral: Negative.      Physical Exam:  weight is 116 lb 12.8 oz (53 kg). Her oral temperature is 98.5 F (36.9 C). Her blood pressure is 161/72 (abnormal) and her pulse is 58 (abnormal). Her respiration is 19 and oxygen saturation is 100%.   Wt Readings from Last 3 Encounters:  03/06/18 116 lb 12.8 oz (53 kg)  01/07/18 112 lb 1.9 oz (50.9 kg)  12/31/17 114 lb (51.7 kg)    Physical Exam Vitals signs reviewed.  HENT:     Head: Normocephalic and atraumatic.  Eyes:     Pupils: Pupils are equal, round, and reactive to light.  Neck:     Musculoskeletal: Normal range of motion.  Cardiovascular:     Rate and Rhythm: Normal rate and regular rhythm.     Heart sounds: Normal heart sounds.  Pulmonary:     Effort: Pulmonary effort is normal.     Breath sounds: Normal breath sounds.  Abdominal:     General: Bowel sounds are normal.     Palpations: Abdomen is soft.  Musculoskeletal: Normal range of motion.        General: No tenderness or deformity.  Lymphadenopathy:     Cervical: No cervical adenopathy.  Skin:    General: Skin is warm and dry.     Findings: No erythema or rash.  Neurological:     Mental Status: She is alert and oriented to person, place, and time.  Psychiatric:        Behavior: Behavior normal.        Thought Content: Thought content normal.        Judgment: Judgment normal.     Lab Results  Component Value Date   WBC 6.9 03/06/2018   HGB 14.7 03/06/2018   HCT 45.7 03/06/2018   MCV 90.7 03/06/2018   PLT 268 03/06/2018   Lab Results  Component Value Date   FERRITIN 11 12/31/2017   IRON 43 12/31/2017   TIBC 469 (H) 12/31/2017   UIBC 426 (H) 12/31/2017   IRONPCTSAT 9 (L) 12/31/2017   Lab Results  Component Value Date   RETICCTPCT 1.2 08/03/2010   RBC 5.04  03/06/2018   RETICCTABS 61.3 08/03/2010   No results found for: KPAFRELGTCHN, LAMBDASER, KAPLAMBRATIO No results found for: IGGSERUM, IGA, IGMSERUM No results found for: TOTALPROTELP, ALBUMINELP, A1GS, A2GS, BETS, BETA2SER, GAMS, MSPIKE, SPEI   Chemistry      Component Value Date/Time   NA 132 (L) 03/06/2018 1128   NA 131 (L) 10/30/2017 1005   NA 139 01/03/2017 1327   NA 136 12/28/2015 1311   K 4.6 03/06/2018 1128   K 3.7  01/03/2017 1327   K 4.9 12/28/2015 1311   CL 94 (L) 03/06/2018 1128   CL 94 (L) 01/03/2017 1327   CO2 32 03/06/2018 1128   CO2 31 01/03/2017 1327   CO2 28 12/28/2015 1311   BUN 15 03/06/2018 1128   BUN 11 10/30/2017 1005   BUN 12 01/03/2017 1327   BUN 10.4 12/28/2015 1311   CREATININE 0.82 03/06/2018 1128   CREATININE 1.0 01/03/2017 1327   CREATININE 0.9 12/28/2015 1311      Component Value Date/Time   CALCIUM 9.6 03/06/2018 1128   CALCIUM 8.8 01/03/2017 1327   CALCIUM 9.6 12/28/2015 1311   ALKPHOS 67 03/06/2018 1128   ALKPHOS 85 (H) 01/03/2017 1327   ALKPHOS 83 12/28/2015 1311   AST 18 03/06/2018 1128   AST 25 12/28/2015 1311   ALT 10 03/06/2018 1128   ALT 19 01/03/2017 1327   ALT 13 12/28/2015 1311   BILITOT 0.5 03/06/2018 1128   BILITOT 0.74 12/28/2015 1311     Impression and Plan: Ms. Rezabek is a 68 yo white female with polycythemia.   We will go ahead and phlebotomize her today.  This has to be done at the main cancer center.  I will plan to get her back to see me in another 6 weeks.   [name], MD 1/15/20201:06 PM

## 2018-03-07 LAB — FERRITIN: Ferritin: 17 ng/mL (ref 11–307)

## 2018-03-07 LAB — IRON AND TIBC
IRON: 56 ug/dL (ref 41–142)
Saturation Ratios: 12 % — ABNORMAL LOW (ref 21–57)
TIBC: 466 ug/dL — ABNORMAL HIGH (ref 236–444)
UIBC: 410 ug/dL — ABNORMAL HIGH (ref 120–384)

## 2018-03-11 ENCOUNTER — Telehealth: Payer: Self-pay | Admitting: Hematology & Oncology

## 2018-03-11 NOTE — Telephone Encounter (Signed)
Scheduled appt per 1/15 staff message - unable to reach patient - left message with appt date and time

## 2018-03-12 ENCOUNTER — Telehealth: Payer: Self-pay | Admitting: Hematology & Oncology

## 2018-03-12 NOTE — Telephone Encounter (Signed)
Called and spoke with patient regarding appointment for 03/13/2018 @ [facility]

## 2018-03-13 ENCOUNTER — Inpatient Hospital Stay: Payer: PPO

## 2018-03-13 DIAGNOSIS — D45 Polycythemia vera: Secondary | ICD-10-CM | POA: Diagnosis not present

## 2018-03-13 NOTE — Progress Notes (Signed)
MD Ennever aware of BP prior to phlebotomy and ordered US to proceed with phlebotomy today and repeat VS prior to leaving

## 2018-03-22 DIAGNOSIS — F325 Major depressive disorder, single episode, in full remission: Secondary | ICD-10-CM | POA: Diagnosis not present

## 2018-03-22 DIAGNOSIS — Z0000 Encounter for general adult medical examination without abnormal findings: Secondary | ICD-10-CM | POA: Diagnosis not present

## 2018-03-22 DIAGNOSIS — J449 Chronic obstructive pulmonary disease, unspecified: Secondary | ICD-10-CM | POA: Diagnosis not present

## 2018-03-22 DIAGNOSIS — K219 Gastro-esophageal reflux disease without esophagitis: Secondary | ICD-10-CM | POA: Diagnosis not present

## 2018-03-22 DIAGNOSIS — I2510 Atherosclerotic heart disease of native coronary artery without angina pectoris: Secondary | ICD-10-CM | POA: Diagnosis not present

## 2018-03-22 DIAGNOSIS — I779 Disorder of arteries and arterioles, unspecified: Secondary | ICD-10-CM | POA: Diagnosis not present

## 2018-03-22 DIAGNOSIS — F17210 Nicotine dependence, cigarettes, uncomplicated: Secondary | ICD-10-CM | POA: Diagnosis not present

## 2018-03-22 DIAGNOSIS — I700 Atherosclerosis of aorta: Secondary | ICD-10-CM | POA: Diagnosis not present

## 2018-03-22 DIAGNOSIS — D45 Polycythemia vera: Secondary | ICD-10-CM | POA: Diagnosis not present

## 2018-03-22 DIAGNOSIS — G4700 Insomnia, unspecified: Secondary | ICD-10-CM | POA: Diagnosis not present

## 2018-03-22 DIAGNOSIS — R7303 Prediabetes: Secondary | ICD-10-CM | POA: Diagnosis not present

## 2018-03-22 DIAGNOSIS — Z23 Encounter for immunization: Secondary | ICD-10-CM | POA: Diagnosis not present

## 2018-03-22 DIAGNOSIS — Z1389 Encounter for screening for other disorder: Secondary | ICD-10-CM | POA: Diagnosis not present

## 2018-03-22 DIAGNOSIS — E782 Mixed hyperlipidemia: Secondary | ICD-10-CM | POA: Diagnosis not present

## 2018-03-22 DIAGNOSIS — I10 Essential (primary) hypertension: Secondary | ICD-10-CM | POA: Diagnosis not present

## 2018-04-17 ENCOUNTER — Other Ambulatory Visit: Payer: Self-pay

## 2018-04-17 ENCOUNTER — Inpatient Hospital Stay: Payer: PPO | Attending: Hematology & Oncology

## 2018-04-17 ENCOUNTER — Inpatient Hospital Stay (HOSPITAL_BASED_OUTPATIENT_CLINIC_OR_DEPARTMENT_OTHER): Payer: PPO | Admitting: Hematology & Oncology

## 2018-04-17 VITALS — BP 175/64 | HR 60 | Temp 98.2°F | Resp 20 | Wt 118.5 lb

## 2018-04-17 DIAGNOSIS — D45 Polycythemia vera: Secondary | ICD-10-CM | POA: Insufficient documentation

## 2018-04-17 DIAGNOSIS — Z720 Tobacco use: Secondary | ICD-10-CM

## 2018-04-17 LAB — CBC WITH DIFFERENTIAL (CANCER CENTER ONLY)
Abs Immature Granulocytes: 0.02 10*3/uL (ref 0.00–0.07)
BASOS PCT: 0 %
Basophils Absolute: 0 10*3/uL (ref 0.0–0.1)
Eosinophils Absolute: 0 10*3/uL (ref 0.0–0.5)
Eosinophils Relative: 1 %
HCT: 38.2 % (ref 36.0–46.0)
Hemoglobin: 12 g/dL (ref 12.0–15.0)
Immature Granulocytes: 0 %
Lymphocytes Relative: 25 %
Lymphs Abs: 2.1 10*3/uL (ref 0.7–4.0)
MCH: 29.1 pg (ref 26.0–34.0)
MCHC: 31.4 g/dL (ref 30.0–36.0)
MCV: 92.5 fL (ref 80.0–100.0)
Monocytes Absolute: 0.8 10*3/uL (ref 0.1–1.0)
Monocytes Relative: 9 %
Neutro Abs: 5.4 10*3/uL (ref 1.7–7.7)
Neutrophils Relative %: 65 %
Platelet Count: 281 10*3/uL (ref 150–400)
RBC: 4.13 MIL/uL (ref 3.87–5.11)
RDW: 13.8 % (ref 11.5–15.5)
WBC Count: 8.4 10*3/uL (ref 4.0–10.5)
nRBC: 0 % (ref 0.0–0.2)

## 2018-04-17 LAB — CMP (CANCER CENTER ONLY)
ALBUMIN: 4.5 g/dL (ref 3.5–5.0)
ALT: 11 U/L (ref 0–44)
AST: 19 U/L (ref 15–41)
Alkaline Phosphatase: 84 U/L (ref 38–126)
Anion gap: 8 (ref 5–15)
BUN: 12 mg/dL (ref 8–23)
CO2: 32 mmol/L (ref 22–32)
Calcium: 9.6 mg/dL (ref 8.9–10.3)
Chloride: 95 mmol/L — ABNORMAL LOW (ref 98–111)
Creatinine: 0.79 mg/dL (ref 0.44–1.00)
GFR, Est AFR Am: >60 mL/min (ref >60)
GFR, Estimated: >60 mL/min (ref >60)
Glucose, Bld: 110 mg/dL — ABNORMAL HIGH (ref 70–99)
Potassium: 4.1 mmol/L (ref 3.5–5.1)
Sodium: 135 mmol/L (ref 135–145)
Total Bilirubin: 0.5 mg/dL (ref 0.3–1.2)
Total Protein: 7.2 g/dL (ref 6.5–8.1)

## 2018-04-17 NOTE — Progress Notes (Signed)
Hematology and Oncology Follow Up Visit  [name] [id number] 01/03/1951 68 y.o. 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  She is doing quite well.  She is really worried about the coronavirus.  She is not sure if she is going to survive this coronavirus outbreak.  I told her that everything so far is okay in [place].  She refuses to travel by plane.  She is still smoking.  Again, the coronavirus scare is really affecting her.  I tried to reassure her that she has a very, very low risk of getting sick with this.  She was last phlebotomized in January.  She did well with phlebotomy.  She has had no cough.  Her weight continues to go up which she is happy about.  There is no change in her bowel or bladder habits.  Overall, her performance status is ECOG 1.   Medications:  Allergies as of 04/17/2018      Reactions   Diphenhydramine Hcl Hives   Other reaction(s): Hives   Bactrim [sulfamethoxazole-trimethoprim] Nausea And Vomiting   Sulfamethoxazole-trimethoprim Nausea And Vomiting   Other reaction(s): Nausea And Vomiting      Medication List       Accurate as of April 17, 2018  3:24 PM. Always use your most recent med list.        acetaminophen 500 MG tablet Commonly known as:  TYLENOL Take 500-1,000 mg by mouth every 6 (six) hours as needed for mild pain or headache (depends on pain if takes 1-2 tablets).   albuterol 108 (90 Base) MCG/ACT inhaler Commonly known as:  PROVENTIL HFA;VENTOLIN HFA Inhale 2 puffs into the lungs every 6 (six) hours as needed for wheezing or shortness of breath.   ALPRAZolam 0.5 MG tablet Commonly known as:  XANAX Take 0.5 mg by mouth at  bedtime.   amLODipine 5 MG tablet Commonly known as:  NORVASC Take 2 tablets (10 mg total) by mouth daily.   Biotin 1000 MCG tablet Take 1,000 mcg by mouth daily.   BREO ELLIPTA 100-25 MCG/INH Aepb Generic drug:  fluticasone furoate-vilanterol INHALE ONE PUFF ONCE A DAY   CHANTIX STARTING MONTH PAK 0.5 MG X 11 & 1 MG X 42 tablet Generic drug:  varenicline See admin instructions.   clopidogrel 75 MG tablet Commonly known as:  PLAVIX Take 1 tablet (75 mg total) by mouth daily.   clotrimazole-betamethasone cream Commonly known as:  LOTRISONE use 1 application to affected area as needed for sore areas on body   escitalopram 20 MG tablet Commonly known as:  LEXAPRO Take 20 mg by mouth daily.   hydrochlorothiazide 12.5 MG capsule Commonly known as:  MICROZIDE Take 1 capsule (12.5 mg total) by mouth daily.   HYOSCYAMINE PO Take by mouth.   lansoprazole 30 MG capsule Commonly known as:  PREVACID Take 30 mg by mouth daily as needed (for ulcer/acid reflex). Ulcer/ acid reflux   metoprolol succinate 100 MG 24 hr tablet Commonly known as:  TOPROL-XL Take 1  & 1/2 (one & one-half) tablets by mouth daily.   nitroGLYCERIN 0.4 MG SL tablet Commonly known as:  NITROSTAT Place 1 tablet (0.4 mg  total) under the tongue every 5 (five) minutes as needed for chest pain.   rosuvastatin 10 MG tablet Commonly known as:  CRESTOR TAKE 1 TABLET BY MOUTH ONCE DAILY FOR 30 DAYS   telmisartan 20 MG tablet Commonly known as:  MICARDIS Take 1 tablet (20 mg total) by mouth daily.   VITAMIN C PO Take 2 tablets by mouth daily.   vitamin E 400 UNIT capsule Take 400 Units by mouth daily.       Allergies:  Allergies  Allergen Reactions  . Diphenhydramine Hcl Hives    Other reaction(s): Hives  . Bactrim [Sulfamethoxazole-Trimethoprim] Nausea And Vomiting  . Sulfamethoxazole-Trimethoprim Nausea And Vomiting    Other reaction(s): Nausea And Vomiting    Past Medical History, Surgical  history, Social history, and Family History were reviewed and updated.  Review of Systems: Review of Systems  Constitutional: Positive for malaise/fatigue.  HENT: Negative.   Eyes: Negative.   Respiratory: Negative.   Cardiovascular: Negative.   Gastrointestinal: Positive for abdominal pain, diarrhea and nausea.  Genitourinary: Negative.   Musculoskeletal: Negative.   Skin: Negative.   Neurological: Negative.   Endo/Heme/Allergies: Negative.   Psychiatric/Behavioral: Negative.      Physical Exam:  weight is 118 lb 8 oz (53.8 kg). Her oral temperature is 98.2 F (36.8 C). Her blood pressure is 175/64 (abnormal) and her pulse is 60. Her respiration is 20 and oxygen saturation is 100%.   Wt Readings from Last 3 Encounters:  04/17/18 118 lb 8 oz (53.8 kg)  03/06/18 116 lb 12.8 oz (53 kg)  01/07/18 112 lb 1.9 oz (50.9 kg)    Physical Exam Vitals signs reviewed.  HENT:     Head: Normocephalic and atraumatic.  Eyes:     Pupils: Pupils are equal, round, and reactive to light.  Neck:     Musculoskeletal: Normal range of motion.  Cardiovascular:     Rate and Rhythm: Normal rate and regular rhythm.     Heart sounds: Normal heart sounds.  Pulmonary:     Effort: Pulmonary effort is normal.     Breath sounds: Normal breath sounds.  Abdominal:     General: Bowel sounds are normal.     Palpations: Abdomen is soft.  Musculoskeletal: Normal range of motion.        General: No tenderness or deformity.  Lymphadenopathy:     Cervical: No cervical adenopathy.  Skin:    General: Skin is warm and dry.     Findings: No erythema or rash.  Neurological:     Mental Status: She is alert and oriented to person, place, and time.  Psychiatric:        Behavior: Behavior normal.        Thought Content: Thought content normal.        Judgment: Judgment normal.     Lab Results  Component Value Date   WBC 8.4 04/17/2018   HGB 12.0 04/17/2018   HCT 38.2 04/17/2018   MCV 92.5 04/17/2018     PLT 281 04/17/2018   Lab Results  Component Value Date   FERRITIN 17 03/06/2018   IRON 56 03/06/2018   TIBC 466 (H) 03/06/2018   UIBC 410 (H) 03/06/2018   IRONPCTSAT 12 (L) 03/06/2018   Lab Results  Component Value Date   RETICCTPCT 1.2 08/03/2010   RBC 4.13 04/17/2018   RETICCTABS 61.3 08/03/2010   No results found for: KPAFRELGTCHN, LAMBDASER, KAPLAMBRATIO No results found for: IGGSERUM, IGA, IGMSERUM No results found for: TOTALPROTELP, ALBUMINELP,  Nils Flack, SPEI   Chemistry      Component Value Date/Time   NA 135 04/17/2018 1449   NA 131 (L) 10/30/2017 1005   NA 139 01/03/2017 1327   NA 136 12/28/2015 1311   K 4.1 04/17/2018 1449   K 3.7 01/03/2017 1327   K 4.9 12/28/2015 1311   CL 95 (L) 04/17/2018 1449   CL 94 (L) 01/03/2017 1327   CO2 32 04/17/2018 1449   CO2 31 01/03/2017 1327   CO2 28 12/28/2015 1311   BUN 12 04/17/2018 1449   BUN 11 10/30/2017 1005   BUN 12 01/03/2017 1327   BUN 10.4 12/28/2015 1311   CREATININE 0.79 04/17/2018 1449   CREATININE 1.0 01/03/2017 1327   CREATININE 0.9 12/28/2015 1311      Component Value Date/Time   CALCIUM 9.6 04/17/2018 1449   CALCIUM 8.8 01/03/2017 1327   CALCIUM 9.6 12/28/2015 1311   ALKPHOS 84 04/17/2018 1449   ALKPHOS 85 (H) 01/03/2017 1327   ALKPHOS 83 12/28/2015 1311   AST 19 04/17/2018 1449   AST 25 12/28/2015 1311   ALT 11 04/17/2018 1449   ALT 19 01/03/2017 1327   ALT 13 12/28/2015 1311   BILITOT 0.5 04/17/2018 1449   BILITOT 0.74 12/28/2015 1311     Impression and Plan: Ms. Smart is a 68 yo white female with polycythemia.   She really is focused on the coronavirus.  I just tried to reassure her that I did not think that this was going to be a problem for [facility].  She does not need to be phlebotomized.  Her hemoglobin and hematocrit are nice and low.  I will plan to get her back now in 6 weeks.  [name], MD 2/26/20203:24 PM

## 2018-04-18 LAB — IRON AND TIBC
Iron: 35 ug/dL — ABNORMAL LOW (ref 41–142)
Saturation Ratios: 7 % — ABNORMAL LOW (ref 21–57)
TIBC: 491 ug/dL — ABNORMAL HIGH (ref 236–444)
UIBC: 457 ug/dL — ABNORMAL HIGH (ref 120–384)

## 2018-04-18 LAB — FERRITIN: FERRITIN: 16 ng/mL (ref 11–307)

## 2018-04-25 ENCOUNTER — Observation Stay (HOSPITAL_COMMUNITY)
Admission: EM | Admit: 2018-04-25 | Discharge: 2018-04-27 | Disposition: A | Discharge status: Home / Self Care | Payer: PPO | Attending: Internal Medicine | Admitting: Internal Medicine

## 2018-04-25 ENCOUNTER — Emergency Department (HOSPITAL_COMMUNITY): Payer: PPO

## 2018-04-25 DIAGNOSIS — Z882 Allergy status to sulfonamides status: Secondary | ICD-10-CM | POA: Diagnosis not present

## 2018-04-25 DIAGNOSIS — Z79899 Other long term (current) drug therapy: Secondary | ICD-10-CM | POA: Diagnosis not present

## 2018-04-25 DIAGNOSIS — Z01818 Encounter for other preprocedural examination: Secondary | ICD-10-CM | POA: Diagnosis not present

## 2018-04-25 DIAGNOSIS — Y92008 Other place in unspecified non-institutional (private) residence as the place of occurrence of the external cause: Secondary | ICD-10-CM | POA: Diagnosis not present

## 2018-04-25 DIAGNOSIS — K219 Gastro-esophageal reflux disease without esophagitis: Secondary | ICD-10-CM | POA: Diagnosis not present

## 2018-04-25 DIAGNOSIS — S52201A Unspecified fracture of shaft of right ulna, initial encounter for closed fracture: Secondary | ICD-10-CM | POA: Diagnosis present

## 2018-04-25 DIAGNOSIS — S52391A Other fracture of shaft of radius, right arm, initial encounter for closed fracture: Secondary | ICD-10-CM | POA: Diagnosis not present

## 2018-04-25 DIAGNOSIS — Z7951 Long term (current) use of inhaled steroids: Secondary | ICD-10-CM | POA: Diagnosis not present

## 2018-04-25 DIAGNOSIS — I739 Peripheral vascular disease, unspecified: Secondary | ICD-10-CM | POA: Diagnosis not present

## 2018-04-25 DIAGNOSIS — S52121A Displaced fracture of head of right radius, initial encounter for closed fracture: Secondary | ICD-10-CM | POA: Diagnosis not present

## 2018-04-25 DIAGNOSIS — T148XXA Other injury of unspecified body region, initial encounter: Secondary | ICD-10-CM

## 2018-04-25 DIAGNOSIS — S5291XA Unspecified fracture of right forearm, initial encounter for closed fracture: Secondary | ICD-10-CM

## 2018-04-25 DIAGNOSIS — S52271A Monteggia's fracture of right ulna, initial encounter for closed fracture: Secondary | ICD-10-CM | POA: Diagnosis not present

## 2018-04-25 DIAGNOSIS — W19XXXA Unspecified fall, initial encounter: Secondary | ICD-10-CM

## 2018-04-25 DIAGNOSIS — J449 Chronic obstructive pulmonary disease, unspecified: Secondary | ICD-10-CM | POA: Diagnosis not present

## 2018-04-25 DIAGNOSIS — Z419 Encounter for procedure for purposes other than remedying health state, unspecified: Secondary | ICD-10-CM

## 2018-04-25 DIAGNOSIS — I10 Essential (primary) hypertension: Secondary | ICD-10-CM | POA: Diagnosis not present

## 2018-04-25 DIAGNOSIS — Z888 Allergy status to other drugs, medicaments and biological substances status: Secondary | ICD-10-CM | POA: Insufficient documentation

## 2018-04-25 DIAGNOSIS — Z9861 Coronary angioplasty status: Secondary | ICD-10-CM | POA: Diagnosis present

## 2018-04-25 DIAGNOSIS — D45 Polycythemia vera: Secondary | ICD-10-CM | POA: Diagnosis not present

## 2018-04-25 DIAGNOSIS — S52001A Unspecified fracture of upper end of right ulna, initial encounter for closed fracture: Secondary | ICD-10-CM | POA: Diagnosis not present

## 2018-04-25 DIAGNOSIS — F17210 Nicotine dependence, cigarettes, uncomplicated: Secondary | ICD-10-CM | POA: Diagnosis not present

## 2018-04-25 DIAGNOSIS — W1789XA Other fall from one level to another, initial encounter: Secondary | ICD-10-CM | POA: Diagnosis not present

## 2018-04-25 DIAGNOSIS — Y93K1 Activity, walking an animal: Secondary | ICD-10-CM | POA: Diagnosis not present

## 2018-04-25 DIAGNOSIS — S199XXA Unspecified injury of neck, initial encounter: Secondary | ICD-10-CM | POA: Diagnosis not present

## 2018-04-25 DIAGNOSIS — S52091A Other fracture of upper end of right ulna, initial encounter for closed fracture: Secondary | ICD-10-CM

## 2018-04-25 DIAGNOSIS — S0990XA Unspecified injury of head, initial encounter: Secondary | ICD-10-CM | POA: Diagnosis not present

## 2018-04-25 DIAGNOSIS — S52031A Displaced fracture of olecranon process with intraarticular extension of right ulna, initial encounter for closed fracture: Secondary | ICD-10-CM | POA: Diagnosis not present

## 2018-04-25 DIAGNOSIS — R609 Edema, unspecified: Secondary | ICD-10-CM

## 2018-04-25 DIAGNOSIS — Z7902 Long term (current) use of antithrombotics/antiplatelets: Secondary | ICD-10-CM | POA: Diagnosis not present

## 2018-04-25 DIAGNOSIS — S52101A Unspecified fracture of upper end of right radius, initial encounter for closed fracture: Secondary | ICD-10-CM

## 2018-04-25 DIAGNOSIS — R52 Pain, unspecified: Secondary | ICD-10-CM

## 2018-04-25 DIAGNOSIS — S4991XA Unspecified injury of right shoulder and upper arm, initial encounter: Secondary | ICD-10-CM | POA: Diagnosis not present

## 2018-04-25 DIAGNOSIS — I2510 Atherosclerotic heart disease of native coronary artery without angina pectoris: Secondary | ICD-10-CM | POA: Insufficient documentation

## 2018-04-25 DIAGNOSIS — Z955 Presence of coronary angioplasty implant and graft: Secondary | ICD-10-CM | POA: Diagnosis not present

## 2018-04-25 HISTORY — DX: Peripheral vascular disease, unspecified: I73.9

## 2018-04-25 HISTORY — DX: Unspecified viral hepatitis C without hepatic coma: B19.20

## 2018-04-25 HISTORY — DX: Essential (primary) hypertension: I10

## 2018-04-25 MED ORDER — HYDROMORPHONE HCL 1 MG/ML IJ SOLN
0.5000 mg | Freq: Once | INTRAMUSCULAR | Status: AC
  Administered 2018-04-25: 0.5 mg via INTRAVENOUS
  Filled 2018-04-25: qty 1

## 2018-04-25 MED ORDER — NICOTINE 21 MG/24HR TD PT24
21.0000 mg | MEDICATED_PATCH | Freq: Once | TRANSDERMAL | Status: AC
  Administered 2018-04-26: 21 mg via TRANSDERMAL
  Filled 2018-04-25: qty 1

## 2018-04-25 NOTE — ED Provider Notes (Signed)
[facility] EMERGENCY DEPARTMENT Provider Note   CSN: [id number] Arrival date & time: 04/25/18  2009    History   Chief Complaint Chief Complaint  Patient presents with  . Fall    HPI [name] is a 68 y.o. female with a past medical history of CAD currently anticoagulated on Plavix presents to ED for right elbow pain, right shoulder pain, neck pain after mechanical fall that occurred prior to arrival.  States that she was walking her neighbor's dog when the dog took off and she was told with the leash.  She fell off of the porch and landed on concrete on her right side.  She laid there until EMS was called.  She complains of right upper extremity pain from shoulder to elbow.  She denies any head injury or loss of consciousness.  She last took her Plavix yesterday.  She is unsure if she is having neck pain because she states that "I cannot focus on anything else."  Denies any prior elbow fracture, dislocation procedure in the area.  Denies any vision changes, vomiting, numbness in arms or legs.     HPI  No past medical history on file.  There are no active problems to display for this patient.      OB History   No obstetric history on file.      Home Medications    Prior to Admission medications   Medication Sig Start Date End Date Taking? Authorizing Provider  acetaminophen (TYLENOL) 325 MG tablet Take 325-650 mg by mouth every 6 (six) hours as needed for mild pain or headache.   Yes [provider]  albuterol (PROAIR HFA) 108 (90 Base) MCG/ACT inhaler Inhale 2 puffs into the lungs every 6 (six) hours as needed for wheezing or shortness of breath.   Yes [provider]  ALPRAZolam Duanne Moron) 0.5 MG tablet Take 0.5 mg by mouth at bedtime.   Yes [provider]  amLODipine (NORVASC) 5 MG tablet Take 5 mg by mouth daily.   Yes [provider]  Biotin 1000 MCG tablet Take 1,000 mcg by mouth daily.   Yes [provider]  clopidogrel (PLAVIX) 75 MG tablet Take 75 mg by mouth daily.   Yes [provider]  clotrimazole-betamethasone (LOTRISONE) cream Apply 1 application topically 2 (two) times daily as needed (to affected area).   Yes [provider]  escitalopram (LEXAPRO) 10 MG tablet Take 10 mg by mouth daily.   Yes [provider]  fluticasone furoate-vilanterol (BREO ELLIPTA) 100-25 MCG/INH AEPB Inhale 1 puff into the lungs daily.   Yes [provider]  hydrochlorothiazide (MICROZIDE) 12.5 MG capsule Take 12.5 mg by mouth daily.   Yes [provider]  lansoprazole (PREVACID) 30 MG capsule Take 30 mg by mouth every other day.   Yes [provider]  metoprolol succinate (TOPROL-XL) 100 MG 24 hr tablet Take 150 mg by mouth daily. Take with or immediately following a meal.   Yes [provider]  nitroGLYCERIN (NITROSTAT) 0.4 MG SL tablet Place 0.4 mg under the tongue every 5 (five) minutes as needed for chest pain.   Yes [provider]  rosuvastatin (CRESTOR) 10 MG tablet Take 10 mg by mouth at bedtime.   Yes [provider]  telmisartan (MICARDIS) 20 MG tablet Take 20 mg by mouth every evening.   Yes [provider]  vitamin C (ASCORBIC ACID) 500 MG tablet Take 500-1,000 mg by mouth daily.   Yes  [provider]  vitamin E (VITAMIN E) 400 UNIT capsule Take 400 Units by mouth daily.   Yes [provider]    Family History No family history on file.  Social History Social History   Tobacco Use  . Smoking status: Not on file  Substance Use Topics  . Alcohol use: Not on file  . Drug use: Not on file     Allergies   Diphenhydramine and Sulfa antibiotics   Review of Systems Review of Systems  Constitutional: Negative for appetite change, chills and fever.  HENT: Negative for ear pain, rhinorrhea, sneezing and sore throat.   Eyes: Negative for photophobia and visual disturbance.    Respiratory: Negative for cough, chest tightness, shortness of breath and wheezing.   Cardiovascular: Negative for chest pain and palpitations.  Gastrointestinal: Negative for abdominal pain, blood in stool, constipation, diarrhea, nausea and vomiting.  Genitourinary: Negative for dysuria, hematuria and urgency.  Musculoskeletal: Positive for arthralgias and myalgias.  Skin: Negative for rash.  Neurological: Negative for dizziness, weakness and light-headedness.     Physical Exam Updated Vital Signs BP (!) 154/91   Pulse 68   Temp 98.1 F (36.7 C) (Oral)   Resp 18   Ht 5' (1.524 m)   Wt 52.2 kg   SpO2 96%   BMI 22.46 kg/m   Physical Exam Vitals signs and nursing note reviewed.  Constitutional:      General: She is not in acute distress.    Appearance: She is well-developed.  HENT:     Head: Normocephalic and atraumatic.     Nose: Nose normal.  Eyes:     General: No scleral icterus.       Left eye: No discharge.     Conjunctiva/sclera: Conjunctivae normal.  Neck:     Musculoskeletal: Normal range of motion and neck supple.  Cardiovascular:     Rate and Rhythm: Normal rate and regular rhythm.     Heart sounds: Normal heart sounds. No murmur. No friction rub. No gallop.   Pulmonary:     Effort: Pulmonary effort is normal. No respiratory distress.     Breath sounds: Normal breath sounds.  Abdominal:     General: Bowel sounds are normal. There is no distension.     Palpations: Abdomen is soft.     Tenderness: There is no abdominal tenderness. There is no guarding.  Musculoskeletal: Normal range of motion.  Skin:    General: Skin is warm and dry.     Findings: No rash.  Neurological:     Mental Status: She is alert.     Motor: No abnormal muscle tone.     Coordination: Coordination normal.      ED Treatments / Results  Labs (all labs ordered are listed, but only abnormal results are displayed) Labs Reviewed  CBC WITH DIFFERENTIAL/PLATELET  BASIC METABOLIC  PANEL  PROTIME-INR  APTT    EKG None  Radiology Dg Shoulder Right  Result Date: 04/25/2018 CLINICAL DATA:  Fall EXAM: RIGHT SHOULDER - 2+ VIEW COMPARISON:  None. FINDINGS: No fracture or malalignment. Mild AC joint degenerative change. The right lung apex is clear. IMPRESSION: No acute osseous abnormality Electronically Signed   By: Donavan Foil M.D.   On: 04/25/2018 21:33   Dg Elbow 2 Views Right  Result Date: 04/25/2018 CLINICAL DATA:  Fall, arm pain EXAM: RIGHT ELBOW - 2 VIEW COMPARISON:  None. FINDINGS: Protuberant soft tissue swelling about the elbow. Acute comminuted intra-articular fracture involving the proximal ulna  with multiple displaced fracture fragments. Acute comminuted and impacted fracture involving the radial head and possibly the proximal shaft of the radius with displacement of the bone fragment containing the radial head. IMPRESSION: Marked soft tissue swelling with acute comminuted and displaced complex proximal radius and ulna fractures, difficult to completely characterize due to positioning and overlap of osseous structures. Electronically Signed   By: Donavan Foil M.D.   On: 04/25/2018 21:32   Ct Head Wo Contrast  Result Date: 04/25/2018 CLINICAL DATA:  Head trauma, minor, GCS>=13, high clinical risk, initial exam; C-spine trauma, high clinical risk (NEXUS/CCR). Mechanical fall walking dog, falling onto concrete. EXAM: CT HEAD WITHOUT CONTRAST CT CERVICAL SPINE WITHOUT CONTRAST TECHNIQUE: Multidetector CT imaging of the head and cervical spine was performed following the standard protocol without intravenous contrast. Multiplanar CT image reconstructions of the cervical spine were also generated. COMPARISON:  None. FINDINGS: CT HEAD FINDINGS Brain: Generalized atrophy, slightly advanced for age. No intracranial hemorrhage, mass effect, or midline shift. No hydrocephalus. The basilar cisterns are patent. No evidence of territorial infarct or acute ischemia. No extra-axial  or intracranial fluid collection. Vascular: Atherosclerosis of skullbase vasculature without hyperdense vessel or abnormal calcification. Skull: No fracture or focal lesion. Sinuses/Orbits: Trace paranasal sinus mucosal thickening without fluid level. Mastoid air cells are clear. Visualized orbits are unremarkable. Other: None. CT CERVICAL SPINE FINDINGS Alignment: Normal. Skull base and vertebrae: No acute fracture. Vertebral body heights are maintained. The dens and skull base are intact. Partial ankylosis of posterior elements of C2-C3. Soft tissues and spinal canal: No prevertebral fluid or swelling. No visible canal hematoma. Disc levels: Disc space narrowing and endplate spurring at F0-Y6 with endplate changes at C7. Remaining disc spaces are preserved. Upper chest: No acute findings. Other: Carotid calcifications. IMPRESSION: 1. No acute intracranial abnormality. No skull fracture. 2. Degenerative change in the cervical spine without acute fracture or subluxation. 3. Carotid and skullbase atherosclerosis. Electronically Signed   By: Keith Rake M.D.   On: 04/25/2018 21:08   Ct Cervical Spine Wo Contrast  Result Date: 04/25/2018 CLINICAL DATA:  Head trauma, minor, GCS>=13, high clinical risk, initial exam; C-spine trauma, high clinical risk (NEXUS/CCR). Mechanical fall walking dog, falling onto concrete. EXAM: CT HEAD WITHOUT CONTRAST CT CERVICAL SPINE WITHOUT CONTRAST TECHNIQUE: Multidetector CT imaging of the head and cervical spine was performed following the standard protocol without intravenous contrast. Multiplanar CT image reconstructions of the cervical spine were also generated. COMPARISON:  None. FINDINGS: CT HEAD FINDINGS Brain: Generalized atrophy, slightly advanced for age. No intracranial hemorrhage, mass effect, or midline shift. No hydrocephalus. The basilar cisterns are patent. No evidence of territorial infarct or acute ischemia. No extra-axial or intracranial fluid collection.  Vascular: Atherosclerosis of skullbase vasculature without hyperdense vessel or abnormal calcification. Skull: No fracture or focal lesion. Sinuses/Orbits: Trace paranasal sinus mucosal thickening without fluid level. Mastoid air cells are clear. Visualized orbits are unremarkable. Other: None. CT CERVICAL SPINE FINDINGS Alignment: Normal. Skull base and vertebrae: No acute fracture. Vertebral body heights are maintained. The dens and skull base are intact. Partial ankylosis of posterior elements of C2-C3. Soft tissues and spinal canal: No prevertebral fluid or swelling. No visible canal hematoma. Disc levels: Disc space narrowing and endplate spurring at V7-C5 with endplate changes at C7. Remaining disc spaces are preserved. Upper chest: No acute findings. Other: Carotid calcifications. IMPRESSION: 1. No acute intracranial abnormality. No skull fracture. 2. Degenerative change in the cervical spine without acute fracture or subluxation. 3. Carotid and skullbase  atherosclerosis. Electronically Signed   By: Keith Rake M.D.   On: 04/25/2018 21:08    Procedures Procedures (including critical care time)  Medications Ordered in ED Medications  nicotine (NICODERM CQ - dosed in mg/24 hours) patch 21 mg (has no administration in time range)  fentaNYL (SUBLIMAZE) injection 50 mcg (has no administration in time range)  HYDROmorphone (DILAUDID) injection 0.5 mg (0.5 mg Intravenous Given 04/25/18 2106)  HYDROmorphone (DILAUDID) injection 0.5 mg (0.5 mg Intravenous Given 04/25/18 2205)     Initial Impression / Assessment and Plan / ED Course  I have reviewed the triage vital signs and the nursing notes.  Pertinent labs & imaging results that were available during my care of the patient were reviewed by me and considered in my medical decision making (see chart for details).        68 year old female presents to ED for mechanical fall that occurred prior to arrival.  Complains of right elbow pain.   Denies any head injury or loss of consciousness.  She is currently anticoagulated on Plavix.  CT of the head and neck are unremarkable.  Shoulder x-rays negative.  X-ray of the elbow shows acute comminuted and displaced fractures of the proximal radius and ulna.  Spoke to Dr. Doreatha Martin of orthopedic surgery.  Gave patient the option of following up in the office with him or getting admitted for possible surgery tomorrow although this may not be a possibility if other cases are in line for tomorrow.  I had a long discussion with the patient regarding her options.  She elects to get admitted for possible surgery tomorrow.  We will order medical clearance labs and admit to hospitalist. Care ended off to oncoming provider.     Portions of this note were generated with [employer]. Dictation errors may occur despite best attempts at proofreading.   Final Clinical Impressions(s) / ED Diagnoses   Final diagnoses:  Closed fracture of proximal end of right radius, unspecified fracture morphology, initial encounter  Closed comminuted fracture of proximal end of right ulna, initial encounter    ED Discharge Orders    None       Delia Heady, PA-C 04/26/18 0018    Margette Fast, MD 04/26/18 [phone]

## 2018-04-25 NOTE — ED Triage Notes (Addendum)
Pt arrives via [facility], pt had a mechanical fall while walking a dog. Pt fell onto concrete onto right side. Pt endorses right elbow pain with swelling present. EMS denies LOC. Pt does not believe she hit her head. Pt on plavix, but did not take today. Pt initial pain of 10/10, received 100 of fentanyl, with no change. Pt alert and oriented.

## 2018-04-26 ENCOUNTER — Observation Stay (HOSPITAL_COMMUNITY): Payer: PPO | Admitting: Certified Registered"

## 2018-04-26 ENCOUNTER — Observation Stay (HOSPITAL_COMMUNITY): Payer: PPO

## 2018-04-26 ENCOUNTER — Encounter: Payer: Self-pay | Admitting: Internal Medicine

## 2018-04-26 ENCOUNTER — Emergency Department (HOSPITAL_COMMUNITY): Payer: PPO

## 2018-04-26 ENCOUNTER — Encounter (HOSPITAL_COMMUNITY): Payer: Self-pay | Admitting: Internal Medicine

## 2018-04-26 ENCOUNTER — Encounter (HOSPITAL_COMMUNITY)
Admission: EM | Disposition: A | Discharge status: Home / Self Care | Payer: Self-pay | Source: Home / Self Care | Attending: Emergency Medicine

## 2018-04-26 DIAGNOSIS — S5291XA Unspecified fracture of right forearm, initial encounter for closed fracture: Secondary | ICD-10-CM | POA: Diagnosis present

## 2018-04-26 DIAGNOSIS — S8991XA Unspecified injury of right lower leg, initial encounter: Secondary | ICD-10-CM | POA: Diagnosis not present

## 2018-04-26 DIAGNOSIS — I10 Essential (primary) hypertension: Secondary | ICD-10-CM

## 2018-04-26 DIAGNOSIS — S52101A Unspecified fracture of upper end of right radius, initial encounter for closed fracture: Secondary | ICD-10-CM

## 2018-04-26 DIAGNOSIS — S52091A Other fracture of upper end of right ulna, initial encounter for closed fracture: Secondary | ICD-10-CM | POA: Diagnosis not present

## 2018-04-26 DIAGNOSIS — S52271A Monteggia's fracture of right ulna, initial encounter for closed fracture: Secondary | ICD-10-CM | POA: Diagnosis not present

## 2018-04-26 DIAGNOSIS — G8918 Other acute postprocedural pain: Secondary | ICD-10-CM | POA: Diagnosis not present

## 2018-04-26 DIAGNOSIS — Z79899 Other long term (current) drug therapy: Secondary | ICD-10-CM | POA: Diagnosis not present

## 2018-04-26 DIAGNOSIS — Z9861 Coronary angioplasty status: Secondary | ICD-10-CM | POA: Diagnosis present

## 2018-04-26 DIAGNOSIS — I2510 Atherosclerotic heart disease of native coronary artery without angina pectoris: Secondary | ICD-10-CM | POA: Diagnosis not present

## 2018-04-26 DIAGNOSIS — Z7902 Long term (current) use of antithrombotics/antiplatelets: Secondary | ICD-10-CM | POA: Diagnosis not present

## 2018-04-26 DIAGNOSIS — S52201A Unspecified fracture of shaft of right ulna, initial encounter for closed fracture: Secondary | ICD-10-CM | POA: Diagnosis not present

## 2018-04-26 DIAGNOSIS — D45 Polycythemia vera: Secondary | ICD-10-CM | POA: Diagnosis not present

## 2018-04-26 DIAGNOSIS — J449 Chronic obstructive pulmonary disease, unspecified: Secondary | ICD-10-CM | POA: Diagnosis present

## 2018-04-26 DIAGNOSIS — F17210 Nicotine dependence, cigarettes, uncomplicated: Secondary | ICD-10-CM | POA: Diagnosis not present

## 2018-04-26 DIAGNOSIS — S53004A Unspecified dislocation of right radial head, initial encounter: Secondary | ICD-10-CM | POA: Diagnosis not present

## 2018-04-26 DIAGNOSIS — S52121A Displaced fracture of head of right radius, initial encounter for closed fracture: Secondary | ICD-10-CM | POA: Diagnosis not present

## 2018-04-26 DIAGNOSIS — S52091D Other fracture of upper end of right ulna, subsequent encounter for closed fracture with routine healing: Secondary | ICD-10-CM | POA: Diagnosis not present

## 2018-04-26 DIAGNOSIS — K219 Gastro-esophageal reflux disease without esophagitis: Secondary | ICD-10-CM | POA: Diagnosis not present

## 2018-04-26 DIAGNOSIS — S42401A Unspecified fracture of lower end of right humerus, initial encounter for closed fracture: Secondary | ICD-10-CM | POA: Diagnosis not present

## 2018-04-26 DIAGNOSIS — S52031A Displaced fracture of olecranon process with intraarticular extension of right ulna, initial encounter for closed fracture: Secondary | ICD-10-CM | POA: Diagnosis not present

## 2018-04-26 DIAGNOSIS — I739 Peripheral vascular disease, unspecified: Secondary | ICD-10-CM | POA: Diagnosis not present

## 2018-04-26 HISTORY — PX: ORIF ELBOW FRACTURE: SHX5031

## 2018-04-26 LAB — CBC WITH DIFFERENTIAL/PLATELET
Abs Immature Granulocytes: 0.03 10*3/uL (ref 0.00–0.07)
Basophils Absolute: 0 10*3/uL (ref 0.0–0.1)
Basophils Relative: 0 %
Eosinophils Absolute: 0 10*3/uL (ref 0.0–0.5)
Eosinophils Relative: 0 %
HCT: 38.8 % (ref 36.0–46.0)
Hemoglobin: 12.2 g/dL (ref 12.0–15.0)
Immature Granulocytes: 0 %
LYMPHS PCT: 14 %
Lymphs Abs: 1.9 10*3/uL (ref 0.7–4.0)
MCH: 28.2 pg (ref 26.0–34.0)
MCHC: 31.4 g/dL (ref 30.0–36.0)
MCV: 89.6 fL (ref 80.0–100.0)
Monocytes Absolute: 1.1 10*3/uL — ABNORMAL HIGH (ref 0.1–1.0)
Monocytes Relative: 8 %
Neutro Abs: 10.7 10*3/uL — ABNORMAL HIGH (ref 1.7–7.7)
Neutrophils Relative %: 78 %
Platelets: 263 10*3/uL (ref 150–400)
RBC: 4.33 MIL/uL (ref 3.87–5.11)
RDW: 13.3 % (ref 11.5–15.5)
WBC: 13.8 10*3/uL — ABNORMAL HIGH (ref 4.0–10.5)
nRBC: 0 % (ref 0.0–0.2)

## 2018-04-26 LAB — PROTIME-INR
INR: 1.1 (ref 0.8–1.2)
Prothrombin Time: 13.7 seconds (ref 11.4–15.2)

## 2018-04-26 LAB — BASIC METABOLIC PANEL
ANION GAP: 11 (ref 5–15)
BUN: 15 mg/dL (ref 8–23)
CO2: 26 mmol/L (ref 22–32)
Calcium: 9.1 mg/dL (ref 8.9–10.3)
Chloride: 99 mmol/L (ref 98–111)
Creatinine, Ser: 0.68 mg/dL (ref 0.44–1.00)
GFR calc Af Amer: >60 mL/min (ref >60)
GFR calc non Af Amer: >60 mL/min (ref >60)
Glucose, Bld: 112 mg/dL — ABNORMAL HIGH (ref 70–99)
POTASSIUM: 3.6 mmol/L (ref 3.5–5.1)
Sodium: 136 mmol/L (ref 135–145)

## 2018-04-26 LAB — APTT: aPTT: 33 seconds (ref 24–36)

## 2018-04-26 LAB — HIV ANTIBODY (ROUTINE TESTING W REFLEX): HIV Screen 4th Generation wRfx: NONREACTIVE

## 2018-04-26 LAB — SURGICAL PCR SCREEN
MRSA, PCR: NEGATIVE
Staphylococcus aureus: NEGATIVE

## 2018-04-26 SURGERY — OPEN REDUCTION INTERNAL FIXATION (ORIF) ELBOW/OLECRANON FRACTURE
Anesthesia: Regional | Site: Elbow | Laterality: Right

## 2018-04-26 MED ORDER — BUPIVACAINE-EPINEPHRINE (PF) 0.5% -1:200000 IJ SOLN
INTRAMUSCULAR | Status: DC | PRN
  Administered 2018-04-26: 30 mL via PERINEURAL

## 2018-04-26 MED ORDER — ACETAMINOPHEN 160 MG/5ML PO SOLN: 325.0000 mg | Freq: Once | ORAL | Status: DC

## 2018-04-26 MED ORDER — ACETAMINOPHEN 325 MG PO TABS
650.0000 mg | ORAL_TABLET | Freq: Four times a day (QID) | ORAL | Status: DC
  Administered 2018-04-26 – 2018-04-27 (×3): 650 mg via ORAL
  Filled 2018-04-26 (×4): qty 2

## 2018-04-26 MED ORDER — ESCITALOPRAM OXALATE 10 MG PO TABS
10.0000 mg | ORAL_TABLET | Freq: Every day | ORAL | Status: DC
  Administered 2018-04-27: 10 mg via ORAL
  Filled 2018-04-26: qty 1

## 2018-04-26 MED ORDER — SODIUM CHLORIDE 0.9 % IV SOLN
INTRAVENOUS | Status: DC | PRN
  Administered 2018-04-26: 20 ug/min via INTRAVENOUS

## 2018-04-26 MED ORDER — SUGAMMADEX SODIUM 200 MG/2ML IV SOLN
INTRAVENOUS | Status: DC | PRN
  Administered 2018-04-26: 200 mg via INTRAVENOUS

## 2018-04-26 MED ORDER — LACTATED RINGERS IV SOLN
INTRAVENOUS | Status: DC
  Administered 2018-04-26: 11:00:00 via INTRAVENOUS

## 2018-04-26 MED ORDER — HYDROMORPHONE HCL 1 MG/ML IJ SOLN
1.0000 mg | Freq: Once | INTRAMUSCULAR | Status: AC
  Administered 2018-04-26: 1 mg via INTRAVENOUS
  Filled 2018-04-26: qty 1

## 2018-04-26 MED ORDER — IRBESARTAN 75 MG PO TABS
75.0000 mg | ORAL_TABLET | Freq: Every day | ORAL | Status: DC
  Administered 2018-04-26: 75 mg via ORAL
  Filled 2018-04-26: qty 1

## 2018-04-26 MED ORDER — HYDROMORPHONE HCL 1 MG/ML IJ SOLN
1.0000 mg | INTRAMUSCULAR | Status: DC | PRN
  Administered 2018-04-26 (×2): 1 mg via INTRAVENOUS
  Filled 2018-04-26 (×2): qty 1

## 2018-04-26 MED ORDER — ONDANSETRON HCL 4 MG/2ML IJ SOLN
INTRAMUSCULAR | Status: DC | PRN
  Administered 2018-04-26: 4 mg via INTRAVENOUS

## 2018-04-26 MED ORDER — BACITRACIN ZINC 500 UNIT/GM EX OINT
TOPICAL_OINTMENT | CUTANEOUS | Status: DC | PRN
  Administered 2018-04-26: 1 via TOPICAL

## 2018-04-26 MED ORDER — PHENYLEPHRINE 40 MCG/ML (10ML) SYRINGE FOR IV PUSH (FOR BLOOD PRESSURE SUPPORT)
PREFILLED_SYRINGE | INTRAVENOUS | Status: AC
  Filled 2018-04-26: qty 10

## 2018-04-26 MED ORDER — MIDAZOLAM HCL 2 MG/2ML IJ SOLN
INTRAMUSCULAR | Status: AC
  Filled 2018-04-26: qty 2

## 2018-04-26 MED ORDER — FLUTICASONE FUROATE-VILANTEROL 100-25 MCG/INH IN AEPB
1.0000 | INHALATION_SPRAY | Freq: Every day | RESPIRATORY_TRACT | Status: DC
  Administered 2018-04-26 – 2018-04-27 (×2): 1 via RESPIRATORY_TRACT
  Filled 2018-04-26: qty 28

## 2018-04-26 MED ORDER — LACTATED RINGERS IV SOLN: INTRAVENOUS | Status: DC

## 2018-04-26 MED ORDER — LACTATED RINGERS IV SOLN
INTRAVENOUS | Status: DC | PRN
  Administered 2018-04-26 (×2): via INTRAVENOUS

## 2018-04-26 MED ORDER — LIDOCAINE 2% (20 MG/ML) 5 ML SYRINGE
INTRAMUSCULAR | Status: DC | PRN
  Administered 2018-04-26: 40 mg via INTRAVENOUS

## 2018-04-26 MED ORDER — 0.9 % SODIUM CHLORIDE (POUR BTL) OPTIME
TOPICAL | Status: DC | PRN
  Administered 2018-04-26: 1000 mL

## 2018-04-26 MED ORDER — PROPOFOL 10 MG/ML IV BOLUS
INTRAVENOUS | Status: DC | PRN
  Administered 2018-04-26: 120 mg via INTRAVENOUS

## 2018-04-26 MED ORDER — CEFAZOLIN SODIUM-DEXTROSE 2-4 GM/100ML-% IV SOLN
INTRAVENOUS | Status: AC
  Filled 2018-04-26: qty 100

## 2018-04-26 MED ORDER — ALBUTEROL SULFATE (2.5 MG/3ML) 0.083% IN NEBU: 3.0000 mL | INHALATION_SOLUTION | Freq: Four times a day (QID) | RESPIRATORY_TRACT | Status: DC | PRN

## 2018-04-26 MED ORDER — FENTANYL CITRATE (PF) 100 MCG/2ML IJ SOLN
INTRAMUSCULAR | Status: AC
  Filled 2018-04-26: qty 2

## 2018-04-26 MED ORDER — ALUM & MAG HYDROXIDE-SIMETH 200-200-20 MG/5ML PO SUSP
30.0000 mL | Freq: Once | ORAL | Status: AC
  Administered 2018-04-26: 30 mL via ORAL
  Filled 2018-04-26: qty 30

## 2018-04-26 MED ORDER — MORPHINE SULFATE (PF) 2 MG/ML IV SOLN: 2.0000 mg | INTRAVENOUS | Status: DC | PRN

## 2018-04-26 MED ORDER — PROPOFOL 10 MG/ML IV BOLUS
INTRAVENOUS | Status: AC
  Filled 2018-04-26: qty 20

## 2018-04-26 MED ORDER — ROSUVASTATIN CALCIUM 5 MG PO TABS
10.0000 mg | ORAL_TABLET | Freq: Every day | ORAL | Status: DC
  Administered 2018-04-26 (×2): 10 mg via ORAL
  Filled 2018-04-26 (×2): qty 2

## 2018-04-26 MED ORDER — DEXAMETHASONE SODIUM PHOSPHATE 10 MG/ML IJ SOLN
INTRAMUSCULAR | Status: DC | PRN
  Administered 2018-04-26: 8 mg via INTRAVENOUS

## 2018-04-26 MED ORDER — METOPROLOL SUCCINATE ER 50 MG PO TB24
150.0000 mg | ORAL_TABLET | Freq: Every day | ORAL | Status: DC
  Administered 2018-04-26 – 2018-04-27 (×2): 150 mg via ORAL
  Filled 2018-04-26 (×2): qty 1

## 2018-04-26 MED ORDER — VANCOMYCIN HCL 1000 MG IV SOLR
INTRAVENOUS | Status: AC
  Filled 2018-04-26: qty 1000

## 2018-04-26 MED ORDER — EPHEDRINE SULFATE-NACL 50-0.9 MG/10ML-% IV SOSY
PREFILLED_SYRINGE | INTRAVENOUS | Status: DC | PRN
  Administered 2018-04-26: 5 mg via INTRAVENOUS

## 2018-04-26 MED ORDER — ONDANSETRON HCL 4 MG PO TABS: 4.0000 mg | ORAL_TABLET | Freq: Four times a day (QID) | ORAL | Status: DC | PRN

## 2018-04-26 MED ORDER — OXYCODONE HCL 5 MG PO TABS
5.0000 mg | ORAL_TABLET | ORAL | Status: DC | PRN
  Administered 2018-04-26: 5 mg via ORAL
  Administered 2018-04-27: 10 mg via ORAL
  Filled 2018-04-26: qty 1
  Filled 2018-04-26: qty 2

## 2018-04-26 MED ORDER — ONDANSETRON HCL 4 MG/2ML IJ SOLN: 4.0000 mg | Freq: Four times a day (QID) | INTRAMUSCULAR | Status: DC | PRN

## 2018-04-26 MED ORDER — DEXAMETHASONE SODIUM PHOSPHATE 10 MG/ML IJ SOLN
INTRAMUSCULAR | Status: AC
  Filled 2018-04-26: qty 1

## 2018-04-26 MED ORDER — ROCURONIUM BROMIDE 50 MG/5ML IV SOSY
PREFILLED_SYRINGE | INTRAVENOUS | Status: DC | PRN
  Administered 2018-04-26: 40 mg via INTRAVENOUS

## 2018-04-26 MED ORDER — PANTOPRAZOLE SODIUM 40 MG PO TBEC
40.0000 mg | DELAYED_RELEASE_TABLET | Freq: Every day | ORAL | Status: DC
  Administered 2018-04-27: 40 mg via ORAL
  Filled 2018-04-26: qty 1

## 2018-04-26 MED ORDER — ALPRAZOLAM 0.5 MG PO TABS
0.5000 mg | ORAL_TABLET | Freq: Every day | ORAL | Status: DC
  Administered 2018-04-27: 0.5 mg via ORAL
  Filled 2018-04-26: qty 1

## 2018-04-26 MED ORDER — VANCOMYCIN HCL 1000 MG IV SOLR
INTRAVENOUS | Status: DC | PRN
  Administered 2018-04-26: 1000 mg

## 2018-04-26 MED ORDER — ACETAMINOPHEN 325 MG PO TABS: 650.0000 mg | ORAL_TABLET | Freq: Four times a day (QID) | ORAL | Status: DC | PRN

## 2018-04-26 MED ORDER — PROMETHAZINE HCL 25 MG/ML IJ SOLN: 6.2500 mg | INTRAMUSCULAR | Status: DC | PRN

## 2018-04-26 MED ORDER — ALPRAZOLAM 0.5 MG PO TABS: 0.5000 mg | ORAL_TABLET | Freq: Every day | ORAL | Status: DC

## 2018-04-26 MED ORDER — ACETAMINOPHEN 650 MG RE SUPP: 650.0000 mg | Freq: Four times a day (QID) | RECTAL | Status: DC | PRN

## 2018-04-26 MED ORDER — FENTANYL CITRATE (PF) 250 MCG/5ML IJ SOLN
INTRAMUSCULAR | Status: AC
  Filled 2018-04-26: qty 5

## 2018-04-26 MED ORDER — EPHEDRINE 5 MG/ML INJ
INTRAVENOUS | Status: AC
  Filled 2018-04-26: qty 10

## 2018-04-26 MED ORDER — ONDANSETRON HCL 4 MG/2ML IJ SOLN
INTRAMUSCULAR | Status: AC
  Filled 2018-04-26: qty 2

## 2018-04-26 MED ORDER — FENTANYL CITRATE (PF) 100 MCG/2ML IJ SOLN
INTRAMUSCULAR | Status: DC | PRN
  Administered 2018-04-26 (×3): 50 ug via INTRAVENOUS

## 2018-04-26 MED ORDER — HYDROCHLOROTHIAZIDE 12.5 MG PO CAPS
12.5000 mg | ORAL_CAPSULE | Freq: Every day | ORAL | Status: DC
  Administered 2018-04-27: 12.5 mg via ORAL
  Filled 2018-04-26: qty 1

## 2018-04-26 MED ORDER — PHENYLEPHRINE HCL 10 MG/ML IJ SOLN: INTRAMUSCULAR | Status: DC | PRN

## 2018-04-26 MED ORDER — CEFAZOLIN SODIUM-DEXTROSE 2-3 GM-%(50ML) IV SOLR
INTRAVENOUS | Status: DC | PRN
  Administered 2018-04-26: 2 g via INTRAVENOUS

## 2018-04-26 MED ORDER — ACETAMINOPHEN 10 MG/ML IV SOLN: 1000.0000 mg | Freq: Once | INTRAVENOUS | Status: DC | PRN

## 2018-04-26 MED ORDER — CEFAZOLIN SODIUM-DEXTROSE 2-4 GM/100ML-% IV SOLN
2.0000 g | Freq: Three times a day (TID) | INTRAVENOUS | Status: DC
  Administered 2018-04-26 – 2018-04-27 (×2): 2 g via INTRAVENOUS
  Filled 2018-04-26 (×3): qty 100

## 2018-04-26 MED ORDER — HYDROMORPHONE HCL 1 MG/ML IJ SOLN: 0.2500 mg | INTRAMUSCULAR | Status: DC | PRN

## 2018-04-26 MED ORDER — AMLODIPINE BESYLATE 5 MG PO TABS
5.0000 mg | ORAL_TABLET | Freq: Every day | ORAL | Status: DC
  Administered 2018-04-27: 5 mg via ORAL
  Filled 2018-04-26: qty 1

## 2018-04-26 MED ORDER — BACITRACIN ZINC 500 UNIT/GM EX OINT
TOPICAL_OINTMENT | CUTANEOUS | Status: AC
  Filled 2018-04-26: qty 28.35

## 2018-04-26 MED ORDER — FENTANYL CITRATE (PF) 100 MCG/2ML IJ SOLN
50.0000 ug | Freq: Once | INTRAMUSCULAR | Status: AC
  Administered 2018-04-26: 50 ug via INTRAVENOUS
  Filled 2018-04-26: qty 2

## 2018-04-26 MED ORDER — ACETAMINOPHEN 325 MG PO TABS: 325.0000 mg | ORAL_TABLET | Freq: Once | ORAL | Status: DC

## 2018-04-26 MED ORDER — MIDAZOLAM HCL 5 MG/5ML IJ SOLN
INTRAMUSCULAR | Status: DC | PRN
  Administered 2018-04-26: 2 mg via INTRAVENOUS

## 2018-04-26 MED ORDER — MEPERIDINE HCL 50 MG/ML IJ SOLN: 6.2500 mg | INTRAMUSCULAR | Status: DC | PRN

## 2018-04-26 MED ORDER — LIDOCAINE 2% (20 MG/ML) 5 ML SYRINGE
INTRAMUSCULAR | Status: AC
  Filled 2018-04-26: qty 5

## 2018-04-26 MED ORDER — ROCURONIUM BROMIDE 50 MG/5ML IV SOSY
PREFILLED_SYRINGE | INTRAVENOUS | Status: AC
  Filled 2018-04-26: qty 5

## 2018-04-26 MED ORDER — GLYCOPYRROLATE PF 0.2 MG/ML IJ SOSY
PREFILLED_SYRINGE | INTRAMUSCULAR | Status: AC
  Filled 2018-04-26: qty 1

## 2018-04-26 MED ORDER — PHENYLEPHRINE 40 MCG/ML (10ML) SYRINGE FOR IV PUSH (FOR BLOOD PRESSURE SUPPORT)
PREFILLED_SYRINGE | INTRAVENOUS | Status: DC | PRN
  Administered 2018-04-26: 120 ug via INTRAVENOUS

## 2018-04-26 SURGICAL SUPPLY — 99 items
APL SKNCLS STERI-STRIP NONHPOA (GAUZE/BANDAGES/DRESSINGS)
BANDAGE ACE 3X5.8 VEL STRL LF (GAUZE/BANDAGES/DRESSINGS) ×2 IMPLANT
BANDAGE ACE 4X5 VEL STRL LF (GAUZE/BANDAGES/DRESSINGS) ×2 IMPLANT
BANDAGE ACE 6X5 VEL STRL LF (GAUZE/BANDAGES/DRESSINGS) ×1 IMPLANT
BENZOIN TINCTURE PRP APPL 2/3 (GAUZE/BANDAGES/DRESSINGS) IMPLANT
BIT DRILL 2.0 (BIT) ×2
BIT DRILL 2.5X2.75 QC CALB (BIT) ×2 IMPLANT
BIT DRILL 2XNS DISP SS SM FRAG (BIT) IMPLANT
BIT DRILL CALIBRATED 2.7 (BIT) ×1 IMPLANT
BIT DRL 2XNS DISP SS SM FRAG (BIT) ×1
BLADE AVERAGE 25X9 (BLADE) IMPLANT
BLADE CLIPPER SURG (BLADE) ×1 IMPLANT
BLADE LONG MED 31X9 (MISCELLANEOUS) ×1 IMPLANT
BLADE SURG 10 STRL SS (BLADE) IMPLANT
BNDG CMPR 9X4 STRL LF SNTH (GAUZE/BANDAGES/DRESSINGS) ×1
BNDG COHESIVE 4X5 TAN STRL (GAUZE/BANDAGES/DRESSINGS) ×2 IMPLANT
BNDG ESMARK 4X9 LF (GAUZE/BANDAGES/DRESSINGS) ×1 IMPLANT
BNDG GAUZE ELAST 4 BULKY (GAUZE/BANDAGES/DRESSINGS) IMPLANT
BRUSH SCRUB SURG 4.25 DISP (MISCELLANEOUS) ×3 IMPLANT
CHLORAPREP W/TINT 26ML (MISCELLANEOUS) ×2 IMPLANT
CLEANER TIP ELECTROSURG 2X2 (MISCELLANEOUS) ×2 IMPLANT
CONNECTOR 5 IN 1 STRAIGHT STRL (MISCELLANEOUS) ×1 IMPLANT
COVER SURGICAL LIGHT HANDLE (MISCELLANEOUS) ×3 IMPLANT
COVER WAND RF STERILE (DRAPES) ×2 IMPLANT
CUFF TOURNIQUET SINGLE 18IN (TOURNIQUET CUFF) IMPLANT
CUFF TOURNIQUET SINGLE 24IN (TOURNIQUET CUFF) IMPLANT
DECANTER SPIKE VIAL GLASS SM (MISCELLANEOUS) IMPLANT
DRAPE C-ARM 42X72 X-RAY (DRAPES) ×2 IMPLANT
DRAPE C-ARMOR (DRAPES) ×2 IMPLANT
DRAPE INCISE IOBAN 66X45 STRL (DRAPES) IMPLANT
DRAPE ORTHO SPLIT 77X108 STRL (DRAPES) ×4
DRAPE SURG ORHT 6 SPLT 77X108 (DRAPES) ×2 IMPLANT
DRAPE U-SHAPE 47X51 STRL (DRAPES) ×2 IMPLANT
DRSG ADAPTIC 3X8 NADH LF (GAUZE/BANDAGES/DRESSINGS) ×2 IMPLANT
ELECT REM PT RETURN 9FT ADLT (ELECTROSURGICAL) ×2
ELECTRODE REM PT RTRN 9FT ADLT (ELECTROSURGICAL) ×1 IMPLANT
GAUZE SPONGE 4X4 12PLY STRL (GAUZE/BANDAGES/DRESSINGS) ×2 IMPLANT
GAUZE XEROFORM 5X9 LF (GAUZE/BANDAGES/DRESSINGS) IMPLANT
GLOVE BIO SURGEON STRL SZ 6.5 (GLOVE) ×6 IMPLANT
GLOVE BIO SURGEON STRL SZ7.5 (GLOVE) ×8 IMPLANT
GLOVE BIOGEL PI IND STRL 6.5 (GLOVE) ×1 IMPLANT
GLOVE BIOGEL PI IND STRL 7.5 (GLOVE) ×1 IMPLANT
GLOVE BIOGEL PI INDICATOR 6.5 (GLOVE) ×1
GLOVE BIOGEL PI INDICATOR 7.5 (GLOVE) ×1
GLOVE ECLIPSE 8.0 STRL XLNG CF (GLOVE) ×1 IMPLANT
GOWN STRL REUS W/ TWL LRG LVL3 (GOWN DISPOSABLE) ×2 IMPLANT
GOWN STRL REUS W/TWL LRG LVL3 (GOWN DISPOSABLE) ×6
K-WIRE ACE 1.6X6 (WIRE) ×2
KIT BASIN OR (CUSTOM PROCEDURE TRAY) ×2 IMPLANT
KIT TURNOVER KIT B (KITS) ×2 IMPLANT
KWIRE ACE 1.6X6 (WIRE) IMPLANT
MANIFOLD NEPTUNE II (INSTRUMENTS) ×2 IMPLANT
NS IRRIG 1000ML POUR BTL (IV SOLUTION) ×2 IMPLANT
PACK ORTHO EXTREMITY (CUSTOM PROCEDURE TRAY) ×2 IMPLANT
PAD ABD 8X10 STRL (GAUZE/BANDAGES/DRESSINGS) ×2 IMPLANT
PAD ARMBOARD 7.5X6 YLW CONV (MISCELLANEOUS) ×4 IMPLANT
PAD CAST 4YDX4 CTTN HI CHSV (CAST SUPPLIES) ×1 IMPLANT
PADDING CAST COTTON 4X4 STRL (CAST SUPPLIES) ×6
PLATE BN STRG CNTR 50.1X5.6X (Plate) ×1 IMPLANT
PLATE F3 FRAG HI FLEX (Plate) ×2 IMPLANT
PLATE OLECRANON LRG (Plate) ×1 IMPLANT
SCREW LOCK 3.5X20 DIST TIB (Screw) ×2 IMPLANT
SCREW LOCK CORT STAR 3.5X14 (Screw) ×1 IMPLANT
SCREW LOCK CORT STAR 3.5X16 (Screw) ×2 IMPLANT
SCREW LOW PROFILE 18MMX3.5MM (Screw) ×1 IMPLANT
SCREW LOW PROFILE 22MMX3.5MM (Screw) ×2 IMPLANT
SCREW NON LOCKING LP 3.5 16MM (Screw) ×6 IMPLANT
SCREW PEG 2.5X10 NONLOCK (Screw) ×2 IMPLANT
SCREW PEG 2.5X16 NONLOCK (Screw) ×2 IMPLANT
SCREW PEG 2.5X18 NONLOCK (Screw) ×2 IMPLANT
SCREW PEG 2.5X20 NONLOCK (Screw) ×1 IMPLANT
SCREW PEG 2.5X24 NONLOCK (Screw) ×2 IMPLANT
SCREW PEG LOCK 2.5X10 (Peg) ×4 IMPLANT
SPLINT PLASTER CAST XFAST 4X15 (CAST SUPPLIES) IMPLANT
SPLINT PLASTER XTRA FAST SET 4 (CAST SUPPLIES) ×1
SPONGE LAP 18X18 RF (DISPOSABLE) ×4 IMPLANT
STAPLER VISISTAT 35W (STAPLE) ×2 IMPLANT
STRIP CLOSURE SKIN 1/2X4 (GAUZE/BANDAGES/DRESSINGS) IMPLANT
SUCTION FRAZIER HANDLE 10FR (MISCELLANEOUS) ×1
SUCTION TUBE FRAZIER 10FR DISP (MISCELLANEOUS) ×1 IMPLANT
SUT ETHILON 3 0 PS 1 (SUTURE) ×2 IMPLANT
SUT MNCRL AB 3-0 PS2 18 (SUTURE) ×1 IMPLANT
SUT MON AB 2-0 CT1 36 (SUTURE) ×1 IMPLANT
SUT PDS AB 2-0 CT1 27 (SUTURE) IMPLANT
SUT PROLENE 0 CT (SUTURE) IMPLANT
SUT PROLENE 3 0 PS 2 (SUTURE) ×2 IMPLANT
SUT VIC AB 0 CT1 27 (SUTURE) ×4
SUT VIC AB 0 CT1 27XBRD ANBCTR (SUTURE) ×2 IMPLANT
SUT VIC AB 2-0 CT1 27 (SUTURE) ×4
SUT VIC AB 2-0 CT1 TAPERPNT 27 (SUTURE) ×2 IMPLANT
SUT VIC AB 2-0 CT3 27 (SUTURE) IMPLANT
SYR CONTROL 10ML LL (SYRINGE) ×2 IMPLANT
TOWEL OR 17X24 6PK STRL BLUE (TOWEL DISPOSABLE) IMPLANT
TOWEL OR 17X26 10 PK STRL BLUE (TOWEL DISPOSABLE) ×4 IMPLANT
TUBE CONNECTING 12X1/4 (SUCTIONS) ×2 IMPLANT
UNDERPAD 30X30 (UNDERPADS AND DIAPERS) ×1 IMPLANT
WASHER 3.5MM (Orthopedic Implant) ×1 IMPLANT
WATER STERILE IRR 1000ML POUR (IV SOLUTION) ×2 IMPLANT
YANKAUER SUCT BULB TIP NO VENT (SUCTIONS) ×2 IMPLANT

## 2018-04-26 NOTE — H&P (Signed)
History and Physical    [name] HYQ:[id number] DOB: 12-09-1950 DOA: 04/25/2018  PCP: Patient, No Pcp Per  Patient coming from: Home  I have personally briefly reviewed patient's old medical records in [place]  Chief Complaint: Fall  HPI: [name] is a 68 y.o. female with medical history significant of CAD s/p 2 stents in 2010, currently on plavix, HTN, PCV gets occasional phlebotomies with Heme/onc last in Jan.  Patient presents to the ED with R elbow pain.  Severe, radiates up shoulder and neck.  Symptoms onset after mechanical fall that occurred while walking neighbors dog (dog took off, she was holding leash and got pulled off of her feet).   ED Course: Comminuted and displaced proximal radial and ulnar fractures.  Has pulse in wrist, sensation in fingers, cant barely move fingers due to pain though she says.  Pain is severe.   Review of Systems: As per HPI otherwise 10 point review of systems negative.   Past Medical History:  Diagnosis Date  . CAD (coronary artery disease)   . COPD (chronic obstructive pulmonary disease) ([place])   . GERD (gastroesophageal reflux disease)   . HCV (hepatitis C virus)   . HTN (hypertension)   . Polycythemia   . PVD (peripheral vascular disease) ([place])     Past Surgical History:  Procedure Laterality Date  . CAROTID ENDARTERECTOMY  2007  . CORONARY ANGIOPLASTY WITH STENT PLACEMENT  2010     reports that she has been smoking cigarettes. She does not have any smokeless tobacco history on file. She reports current alcohol use. She reports that she does not use drugs.  Allergies  Allergen Reactions  . Diphenhydramine Hives  . Sulfa Antibiotics Nausea And Vomiting    Family History  Problem Relation Age of Onset  . Heart disease Mother   . Heart disease Father      Prior to Admission medications   Medication Sig Start Date End Date Taking? Authorizing Provider  acetaminophen (TYLENOL) 325 MG  tablet Take 325-650 mg by mouth every 6 (six) hours as needed for mild pain or headache.   Yes [provider]  albuterol (PROAIR HFA) 108 (90 Base) MCG/ACT inhaler Inhale 2 puffs into the lungs every 6 (six) hours as needed for wheezing or shortness of breath.   Yes [provider]  ALPRAZolam Duanne Moron) 0.5 MG tablet Take 0.5 mg by mouth at bedtime.   Yes [provider]  amLODipine (NORVASC) 5 MG tablet Take 5 mg by mouth daily.   Yes [provider]  Biotin 1000 MCG tablet Take 1,000 mcg by mouth daily.   Yes [provider]  clopidogrel (PLAVIX) 75 MG tablet Take 75 mg by mouth daily.   Yes [provider]  clotrimazole-betamethasone (LOTRISONE) cream Apply 1 application topically 2 (two) times daily as needed (to affected area).   Yes [provider]  escitalopram (LEXAPRO) 10 MG tablet Take 10 mg by mouth daily.   Yes [provider]  fluticasone furoate-vilanterol (BREO ELLIPTA) 100-25 MCG/INH AEPB Inhale 1 puff into the lungs daily.   Yes [provider]  hydrochlorothiazide (MICROZIDE) 12.5 MG capsule Take 12.5 mg by mouth daily.   Yes [provider]  lansoprazole (PREVACID) 30 MG capsule Take 30 mg by mouth every other day.   Yes [provider]  metoprolol succinate (TOPROL-XL) 100 MG 24 hr tablet Take 150 mg by mouth daily. Take with or immediately following a meal.   Yes  [provider]  nitroGLYCERIN (NITROSTAT) 0.4 MG SL tablet Place 0.4 mg under the tongue every 5 (five) minutes as needed for chest pain.   Yes [provider]  rosuvastatin (CRESTOR) 10 MG tablet Take 10 mg by mouth at bedtime.   Yes [provider]  telmisartan (MICARDIS) 20 MG tablet Take 20 mg by mouth every evening.   Yes [provider]  vitamin C (ASCORBIC ACID) 500 MG tablet Take 500-1,000 mg by mouth daily.   Yes [provider]  vitamin E (VITAMIN E) 400 UNIT capsule  Take 400 Units by mouth daily.   Yes [provider]    Physical Exam: Vitals:   04/25/18 2345 04/26/18 0000 04/26/18 0145 04/26/18 0200  BP: (!) 159/64 (!) 168/78 (!) 146/54 (!) 144/60  Pulse: 62 65 66 68  Resp:  16 15 13   Temp:      TempSrc:      SpO2: 96% 96%    Weight:      Height:        Constitutional: NAD, calm, comfortable Eyes: PERRL, lids and conjunctivae normal ENMT: Mucous membranes are moist. Posterior pharynx clear of any exudate or lesions.Normal dentition.  Neck: normal, supple, no masses, no thyromegaly Respiratory: clear to auscultation bilaterally, no wheezing, no crackles. Normal respiratory effort. No accessory muscle use.  Cardiovascular: Regular rate and rhythm, no murmurs / rubs / gallops. No extremity edema. 2+ pedal pulses. No carotid bruits.  Abdomen: no tenderness, no masses palpated. No hepatosplenomegaly. Bowel sounds positive.  Musculoskeletal: R arm in sling and splint, good cap refill and has good waveform on pulse ox on that hand Skin: no rashes, lesions, ulcers. No induration Neurologic: CN 2-12 grossly intact. Sensation intact, DTR normal. Strength 5/5 in all 4.  Psychiatric: Normal judgment and insight. Alert and oriented x 3. Normal mood.    Labs on Admission: I have personally reviewed following labs and imaging studies  CBC: Recent Labs  Lab 04/26/18 0031  WBC 13.8*  NEUTROABS 10.7*  HGB 12.2  HCT 38.8  MCV 89.6  PLT 300   Basic Metabolic Panel: Recent Labs  Lab 04/26/18 0031  NA 136  K 3.6  CL 99  CO2 26  GLUCOSE 112*  BUN 15  CREATININE 0.68  CALCIUM 9.1   GFR: Estimated Creatinine Clearance: 49 mL/min (by C-G formula based on SCr of 0.68 mg/dL). Liver Function Tests: No results for input(s): AST, ALT, ALKPHOS, BILITOT, PROT, ALBUMIN in the last 168 hours. No results for input(s): LIPASE, AMYLASE in the last 168 hours. No results for input(s): AMMONIA in the last 168 hours. Coagulation Profile: Recent  Labs  Lab 04/26/18 0031  INR 1.1   Cardiac Enzymes: No results for input(s): CKTOTAL, CKMB, CKMBINDEX, TROPONINI in the last 168 hours. BNP (last 3 results) No results for input(s): PROBNP in the last 8760 hours. HbA1C: No results for input(s): HGBA1C in the last 72 hours. CBG: No results for input(s): GLUCAP in the last 168 hours. Lipid Profile: No results for input(s): CHOL, HDL, LDLCALC, TRIG, CHOLHDL, LDLDIRECT in the last 72 hours. Thyroid Function Tests: No results for input(s): TSH, T4TOTAL, FREET4, T3FREE, THYROIDAB in the last 72 hours. Anemia Panel: No results for input(s): VITAMINB12, FOLATE, FERRITIN, TIBC, IRON, RETICCTPCT in the last 72 hours. Urine analysis: No results found for: COLORURINE, APPEARANCEUR, LABSPEC, [place], GLUCOSEU, HGBUR, BILIRUBINUR, KETONESUR, PROTEINUR, UROBILINOGEN, NITRITE, LEUKOCYTESUR  Radiological Exams on Admission: Dg Shoulder Right  Result Date: 04/25/2018 CLINICAL DATA:  Fall EXAM:  RIGHT SHOULDER - 2+ VIEW COMPARISON:  None. FINDINGS: No fracture or malalignment. Mild AC joint degenerative change. The right lung apex is clear. IMPRESSION: No acute osseous abnormality Electronically Signed   By: Donavan Foil M.D.   On: 04/25/2018 21:33   Dg Elbow 2 Views Right  Result Date: 04/25/2018 CLINICAL DATA:  Fall, arm pain EXAM: RIGHT ELBOW - 2 VIEW COMPARISON:  None. FINDINGS: Protuberant soft tissue swelling about the elbow. Acute comminuted intra-articular fracture involving the proximal ulna with multiple displaced fracture fragments. Acute comminuted and impacted fracture involving the radial head and possibly the proximal shaft of the radius with displacement of the bone fragment containing the radial head. IMPRESSION: Marked soft tissue swelling with acute comminuted and displaced complex proximal radius and ulna fractures, difficult to completely characterize due to positioning and overlap of osseous structures. Electronically Signed   By: Donavan Foil M.D.   On: 04/25/2018 21:32   Ct Head Wo Contrast  Result Date: 04/25/2018 CLINICAL DATA:  Head trauma, minor, GCS>=13, high clinical risk, initial exam; C-spine trauma, high clinical risk (NEXUS/CCR). Mechanical fall walking dog, falling onto concrete. EXAM: CT HEAD WITHOUT CONTRAST CT CERVICAL SPINE WITHOUT CONTRAST TECHNIQUE: Multidetector CT imaging of the head and cervical spine was performed following the standard protocol without intravenous contrast. Multiplanar CT image reconstructions of the cervical spine were also generated. COMPARISON:  None. FINDINGS: CT HEAD FINDINGS Brain: Generalized atrophy, slightly advanced for age. No intracranial hemorrhage, mass effect, or midline shift. No hydrocephalus. The basilar cisterns are patent. No evidence of territorial infarct or acute ischemia. No extra-axial or intracranial fluid collection. Vascular: Atherosclerosis of skullbase vasculature without hyperdense vessel or abnormal calcification. Skull: No fracture or focal lesion. Sinuses/Orbits: Trace paranasal sinus mucosal thickening without fluid level. Mastoid air cells are clear. Visualized orbits are unremarkable. Other: None. CT CERVICAL SPINE FINDINGS Alignment: Normal. Skull base and vertebrae: No acute fracture. Vertebral body heights are maintained. The dens and skull base are intact. Partial ankylosis of posterior elements of C2-C3. Soft tissues and spinal canal: No prevertebral fluid or swelling. No visible canal hematoma. Disc levels: Disc space narrowing and endplate spurring at Y5-K3 with endplate changes at C7. Remaining disc spaces are preserved. Upper chest: No acute findings. Other: Carotid calcifications. IMPRESSION: 1. No acute intracranial abnormality. No skull fracture. 2. Degenerative change in the cervical spine without acute fracture or subluxation. 3. Carotid and skullbase atherosclerosis. Electronically Signed   By: Keith Rake M.D.   On: 04/25/2018 21:08   Ct  Cervical Spine Wo Contrast  Result Date: 04/25/2018 CLINICAL DATA:  Head trauma, minor, GCS>=13, high clinical risk, initial exam; C-spine trauma, high clinical risk (NEXUS/CCR). Mechanical fall walking dog, falling onto concrete. EXAM: CT HEAD WITHOUT CONTRAST CT CERVICAL SPINE WITHOUT CONTRAST TECHNIQUE: Multidetector CT imaging of the head and cervical spine was performed following the standard protocol without intravenous contrast. Multiplanar CT image reconstructions of the cervical spine were also generated. COMPARISON:  None. FINDINGS: CT HEAD FINDINGS Brain: Generalized atrophy, slightly advanced for age. No intracranial hemorrhage, mass effect, or midline shift. No hydrocephalus. The basilar cisterns are patent. No evidence of territorial infarct or acute ischemia. No extra-axial or intracranial fluid collection. Vascular: Atherosclerosis of skullbase vasculature without hyperdense vessel or abnormal calcification. Skull: No fracture or focal lesion. Sinuses/Orbits: Trace paranasal sinus mucosal thickening without fluid level. Mastoid air cells are clear. Visualized orbits are unremarkable. Other: None. CT CERVICAL SPINE FINDINGS Alignment: Normal. Skull base and vertebrae: No acute fracture. Vertebral  body heights are maintained. The dens and skull base are intact. Partial ankylosis of posterior elements of C2-C3. Soft tissues and spinal canal: No prevertebral fluid or swelling. No visible canal hematoma. Disc levels: Disc space narrowing and endplate spurring at E7-O3 with endplate changes at C7. Remaining disc spaces are preserved. Upper chest: No acute findings. Other: Carotid calcifications. IMPRESSION: 1. No acute intracranial abnormality. No skull fracture. 2. Degenerative change in the cervical spine without acute fracture or subluxation. 3. Carotid and skullbase atherosclerosis. Electronically Signed   By: Keith Rake M.D.   On: 04/25/2018 21:08   Dg Chest Portable 1 View  Result Date:  04/26/2018 CLINICAL DATA:  Clearance. Preop. Elbow fracture. EXAM: PORTABLE CHEST 1 VIEW COMPARISON:  None. FINDINGS: The cardiomediastinal contours are normal. Coronary stent visualized. Atherosclerosis of the aortic arch. Pulmonary vasculature is normal. No consolidation, pleural effusion, or pneumothorax. No acute osseous abnormalities are seen. IMPRESSION: 1. No acute cardiopulmonary abnormality. 2. Aortic atherosclerosis. Electronically Signed   By: Keith Rake M.D.   On: 04/26/2018 00:26    EKG: Independently reviewed.  Assessment/Plan Principal Problem:   Radius/ulna fracture, right, closed, initial encounter Active Problems:   HTN (hypertension)   CAD (coronary artery disease)   Polycythemia vera (HCC)   COPD (chronic obstructive pulmonary disease) ([place])    1. Radius/ulna fx - 1. Dr. Doreatha Martin will try and work in to surgery later today 2. NPO 3. Pain ctrl with dilaudid 4. Elbow in splint, hand seems vascularly intact at least 2. CAD - 1. H/o stents in 2010 2. Low risk stress test in fall last year 3. EKG pending 4. CXR neg 5. Will hold plavix for surgery 3. COPD - 1. Cont home nebs 2. Needs to quit smoking 4. PCV - 1. HGB 12.x 2. Last phlebotomy in Jan this year 5. HTN - continue home BP meds  DVT prophylaxis: SCDs Code Status: Full  Family Communication: No family in room Disposition Plan: Home after admit Consults called: Dr. Doreatha Martin Admission status: Place in obs    Vansh Reckart, [place] [facility]  How to contact the [facility] Attending or Consulting provider [place] or covering provider during after hours [place], for this patient?  1. Check the care team in [facility] and look for a) attending/consulting [facility] provider listed and b) the [facility] team listed 2. Log into [email or web address]  Amion Physician Scheduling and messaging for groups and whole hospitals  On call and physician scheduling software for group practices, residents, hospitalists and other medical  providers for call, clinic, rotation and shift schedules. On[employer] is a hospital-wide system for scheduling doctors and paging doctors on call. EasyPlot is for scientific plotting and data analysis.  [email or web address]  and use [place]'s universal password to access. If you do not have the password, please contact the hospital operator.  3. Locate the [facility] provider you are looking for under [facility] and page to a number that you can be directly reached. 4. If you still have difficulty reaching the provider, please page the [facility] (Director on Call) for the Hospitalists listed on amion for assistance.  04/26/2018, 2:40 AM

## 2018-04-26 NOTE — Anesthesia Procedure Notes (Signed)
Anesthesia Regional Block: Supraclavicular block   Pre-Anesthetic Checklist: ,, timeout performed, Correct Patient, Correct Site, Correct Laterality, Correct Procedure, Correct Position, site marked, Risks and benefits discussed,  Surgical consent,  Pre-op evaluation,  At surgeon's request and post-op pain management  Laterality: Right  Prep: chloraprep       Needles:  Injection technique: Single-shot  Needle Type: Echogenic Stimulator Needle     Needle Length: 9cm  Needle Gauge: 21     Additional Needles:   Procedures:,,,, ultrasound used (permanent image in chart),,,,  Narrative:  Start time: 04/26/2018 11:05 AM End time: 04/26/2018 11:15 AM Injection made incrementally with aspirations every 5 mL.  Performed by: Personally  Anesthesiologist: Effie Berkshire, MD  Additional Notes: Patient tolerated the procedure well. Local anesthetic introduced in an incremental fashion under minimal resistance after negative aspirations. No paresthesias were elicited. After completion of the procedure, no acute issues were identified and patient continued to be monitored by RN.

## 2018-04-26 NOTE — ED Provider Notes (Addendum)
Signout at shift change from [facility], PA-C See previous providers note for full H&P  Briefly, patient with proximal radial ulnar fracture on the right.  She fell walking her dog.  Orthopedic doctor, Dr. Doreatha Martin, would like to operate in the morning.  Labs are pending to admit to medicine.  Labs and screening chest x-ray have returned within normal limits except mild leukocytosis, which I suspect is from trauma.  I spoke with Dr. Alcario Drought with [facility] who accepts patient for admission.  Patient's pain difficult to control in the ED with Dilaudid.  Transfer of care to Dr. Alcario Drought and Dr. Doreatha Martin and I appreciate his assistance with the patient.  Patient with n.p.o. status.   SPLINT APPLICATION Date/Time: 7:68 AM Authorized by: Frederica Kuster Consent: Verbal consent obtained. Risks and benefits: risks, benefits and alternatives were discussed Consent given by: patient Splint applied by: orthopedic technician Location details: Right arm Splint type: Long-arm posterior Supplies used: orthoglass, ACE Post-procedure: The splinted body part was neurovascularly unchanged following the procedure.  Radial pulse intact. Patient tolerance: Patient tolerated the procedure well with no immediate complications.       Frederica Kuster, PA-C 04/26/18 1157    Margette Fast, MD 04/26/18 1118

## 2018-04-26 NOTE — ED Notes (Signed)
ED TO INPATIENT HANDOFF REPORT  ED Nurse Name and Phone #: Suezanne Jacquet [phone]  S Name/Age/Gender [name] 68 y.o. female Room/Bed: 026C/026C  Code Status   Code Status: Full Code  Home/SNF/Other Home Patient oriented to: self, place, time and situation Is this baseline? Yes   Triage Complete: Triage complete  Chief Complaint fall  Triage Note Pt arrives via [facility], pt had a mechanical fall while walking a dog. Pt fell onto concrete onto right side. Pt endorses right elbow pain with swelling present. EMS denies LOC. Pt does not believe she hit her head. Pt on plavix, but did not take today. Pt initial pain of 10/10, received 100 of fentanyl, with no change. Pt alert and oriented.    Allergies Allergies  Allergen Reactions  . Diphenhydramine Hives  . Sulfa Antibiotics Nausea And Vomiting    Level of Care/Admitting Diagnosis ED Disposition    ED Disposition Condition [facility] Area: [place] [[id number]]  Level of Care: Med-Surg [16]  I expect the patient will be discharged within 24 hours: Yes  LOW acuity---Tx typically complete <24 hrs---ACUTE conditions typically can be evaluated <24 hours---LABS likely to return to acceptable levels <24 hours---IS near functional baseline---EXPECTED to return to current living arrangement---NOT newly hypoxic: Meets criteria for 5C-Observation unit  Diagnosis: Radius/ulna fracture, right, closed, initial encounter [[id number]]  Admitting Physician: Etta Quill [[id number]]  Attending Physician: Etta Quill [[id number]]  PT Class (Do Not Modify): Observation [104]  PT Acc Code (Do Not Modify): Observation [10022]       B Medical/Surgery History Past Medical History:  Diagnosis Date  . CAD (coronary artery disease)   . COPD (chronic obstructive pulmonary disease) ([place])   . GERD (gastroesophageal reflux disease)   . HCV (hepatitis C virus)   . HTN (hypertension)   . Polycythemia   . PVD  (peripheral vascular disease) ([place])    Past Surgical History:  Procedure Laterality Date  . CAROTID ENDARTERECTOMY  2007  . CORONARY ANGIOPLASTY WITH STENT PLACEMENT  2010     A IV Location/Drains/Wounds Patient Lines/Drains/Airways Status   Active Line/Drains/Airways    Name:   Placement date:   Placement time:   Site:   Days:   Peripheral IV 04/25/18 Left Hand   04/25/18    2012    Hand   1          Intake/Output Last 24 hours No intake or output data in the 24 hours ending 04/26/18 0209  Labs/Imaging Results for orders placed or performed during the hospital encounter of 04/25/18 (from the past 48 hour(s))  CBC with Differential     Status: Abnormal   Collection Time: 04/26/18 12:31 AM  Result Value Ref Range   WBC 13.8 (H) 4.0 - 10.5 K/uL   RBC 4.33 3.87 - 5.11 MIL/uL   Hemoglobin 12.2 12.0 - 15.0 g/dL   HCT 38.8 36.0 - 46.0 %   MCV 89.6 80.0 - 100.0 fL   MCH 28.2 26.0 - 34.0 pg   MCHC 31.4 30.0 - 36.0 g/dL   RDW 13.3 11.5 - 15.5 %   Platelets 263 150 - 400 K/uL   nRBC 0.0 0.0 - 0.2 %   Neutrophils Relative % 78 %   Neutro Abs 10.7 (H) 1.7 - 7.7 K/uL   Lymphocytes Relative 14 %   Lymphs Abs 1.9 0.7 - 4.0 K/uL   Monocytes Relative 8 %   Monocytes Absolute 1.1 (H) 0.1 -  1.0 K/uL   Eosinophils Relative 0 %   Eosinophils Absolute 0.0 0.0 - 0.5 K/uL   Basophils Relative 0 %   Basophils Absolute 0.0 0.0 - 0.1 K/uL   Immature Granulocytes 0 %   Abs Immature Granulocytes 0.03 0.00 - 0.07 K/uL    Comment: Performed at [facility], [place] [address] [place], [place] [address]  Basic metabolic panel     Status: Abnormal   Collection Time: 04/26/18 12:31 AM  Result Value Ref Range   Sodium 136 135 - 145 mmol/L   Potassium 3.6 3.5 - 5.1 mmol/L   Chloride 99 98 - 111 mmol/L   CO2 26 22 - 32 mmol/L   Glucose, Bld 112 (H) 70 - 99 mg/dL   BUN 15 8 - 23 mg/dL   Creatinine, Ser 0.68 0.44 - 1.00 mg/dL   Calcium 9.1 8.9 - 10.3 mg/dL   GFR calc non Af Amer >60 >60  mL/min   GFR calc Af Amer >60 >60 mL/min   Anion gap 11 5 - 15    Comment: Performed at [facility], [place] [address], [place], [place] [address]  Protime-INR     Status: None   Collection Time: 04/26/18 12:31 AM  Result Value Ref Range   Prothrombin Time 13.7 11.4 - 15.2 seconds   INR 1.1 0.8 - 1.2    Comment: (NOTE) INR goal varies based on device and disease states. Performed at [facility], [place] [address] [place], [place] [address]   APTT     Status: None   Collection Time: 04/26/18 12:31 AM  Result Value Ref Range   aPTT 33 24 - 36 seconds    Comment: Performed at [facility] [address] [place], [place] [address]   Dg Shoulder Right  Result Date: 04/25/2018 CLINICAL DATA:  Fall EXAM: RIGHT SHOULDER - 2+ VIEW COMPARISON:  None. FINDINGS: No fracture or malalignment. Mild AC joint degenerative change. The right lung apex is clear. IMPRESSION: No acute osseous abnormality Electronically Signed   By: Donavan Foil M.D.   On: 04/25/2018 21:33   Dg Elbow 2 Views Right  Result Date: 04/25/2018 CLINICAL DATA:  Fall, arm pain EXAM: RIGHT ELBOW - 2 VIEW COMPARISON:  None. FINDINGS: Protuberant soft tissue swelling about the elbow. Acute comminuted intra-articular fracture involving the proximal ulna with multiple displaced fracture fragments. Acute comminuted and impacted fracture involving the radial head and possibly the proximal shaft of the radius with displacement of the bone fragment containing the radial head. IMPRESSION: Marked soft tissue swelling with acute comminuted and displaced complex proximal radius and ulna fractures, difficult to completely characterize due to positioning and overlap of osseous structures. Electronically Signed   By: Donavan Foil M.D.   On: 04/25/2018 21:32   Ct Head Wo Contrast  Result Date: 04/25/2018 CLINICAL DATA:  Head trauma, minor, GCS>=13, high clinical risk, initial exam; C-spine trauma, high clinical risk (NEXUS/CCR).  Mechanical fall walking dog, falling onto concrete. EXAM: CT HEAD WITHOUT CONTRAST CT CERVICAL SPINE WITHOUT CONTRAST TECHNIQUE: Multidetector CT imaging of the head and cervical spine was performed following the standard protocol without intravenous contrast. Multiplanar CT image reconstructions of the cervical spine were also generated. COMPARISON:  None. FINDINGS: CT HEAD FINDINGS Brain: Generalized atrophy, slightly advanced for age. No intracranial hemorrhage, mass effect, or midline shift. No hydrocephalus. The basilar cisterns are patent. No evidence of territorial infarct or acute ischemia. No extra-axial or intracranial fluid collection.  Vascular: Atherosclerosis of skullbase vasculature without hyperdense vessel or abnormal calcification. Skull: No fracture or focal lesion. Sinuses/Orbits: Trace paranasal sinus mucosal thickening without fluid level. Mastoid air cells are clear. Visualized orbits are unremarkable. Other: None. CT CERVICAL SPINE FINDINGS Alignment: Normal. Skull base and vertebrae: No acute fracture. Vertebral body heights are maintained. The dens and skull base are intact. Partial ankylosis of posterior elements of C2-C3. Soft tissues and spinal canal: No prevertebral fluid or swelling. No visible canal hematoma. Disc levels: Disc space narrowing and endplate spurring at B5-Z0 with endplate changes at C7. Remaining disc spaces are preserved. Upper chest: No acute findings. Other: Carotid calcifications. IMPRESSION: 1. No acute intracranial abnormality. No skull fracture. 2. Degenerative change in the cervical spine without acute fracture or subluxation. 3. Carotid and skullbase atherosclerosis. Electronically Signed   By: Keith Rake M.D.   On: 04/25/2018 21:08   Ct Cervical Spine Wo Contrast  Result Date: 04/25/2018 CLINICAL DATA:  Head trauma, minor, GCS>=13, high clinical risk, initial exam; C-spine trauma, high clinical risk (NEXUS/CCR). Mechanical fall walking dog, falling  onto concrete. EXAM: CT HEAD WITHOUT CONTRAST CT CERVICAL SPINE WITHOUT CONTRAST TECHNIQUE: Multidetector CT imaging of the head and cervical spine was performed following the standard protocol without intravenous contrast. Multiplanar CT image reconstructions of the cervical spine were also generated. COMPARISON:  None. FINDINGS: CT HEAD FINDINGS Brain: Generalized atrophy, slightly advanced for age. No intracranial hemorrhage, mass effect, or midline shift. No hydrocephalus. The basilar cisterns are patent. No evidence of territorial infarct or acute ischemia. No extra-axial or intracranial fluid collection. Vascular: Atherosclerosis of skullbase vasculature without hyperdense vessel or abnormal calcification. Skull: No fracture or focal lesion. Sinuses/Orbits: Trace paranasal sinus mucosal thickening without fluid level. Mastoid air cells are clear. Visualized orbits are unremarkable. Other: None. CT CERVICAL SPINE FINDINGS Alignment: Normal. Skull base and vertebrae: No acute fracture. Vertebral body heights are maintained. The dens and skull base are intact. Partial ankylosis of posterior elements of C2-C3. Soft tissues and spinal canal: No prevertebral fluid or swelling. No visible canal hematoma. Disc levels: Disc space narrowing and endplate spurring at C5-E5 with endplate changes at C7. Remaining disc spaces are preserved. Upper chest: No acute findings. Other: Carotid calcifications. IMPRESSION: 1. No acute intracranial abnormality. No skull fracture. 2. Degenerative change in the cervical spine without acute fracture or subluxation. 3. Carotid and skullbase atherosclerosis. Electronically Signed   By: Keith Rake M.D.   On: 04/25/2018 21:08   Dg Chest Portable 1 View  Result Date: 04/26/2018 CLINICAL DATA:  Clearance. Preop. Elbow fracture. EXAM: PORTABLE CHEST 1 VIEW COMPARISON:  None. FINDINGS: The cardiomediastinal contours are normal. Coronary stent visualized. Atherosclerosis of the aortic  arch. Pulmonary vasculature is normal. No consolidation, pleural effusion, or pneumothorax. No acute osseous abnormalities are seen. IMPRESSION: 1. No acute cardiopulmonary abnormality. 2. Aortic atherosclerosis. Electronically Signed   By: Keith Rake M.D.   On: 04/26/2018 00:26    Pending Labs Unresulted Labs (From admission, onward)    Start     Ordered   04/26/18 0147  HIV antibody (Routine Testing)  Once,   R     04/26/18 0152          Vitals/Pain Today's Vitals   04/26/18 0000 04/26/18 0025 04/26/18 0145 04/26/18 0148  BP: (!) 168/78  (!) 146/54   Pulse: 65  66   Resp: 16  15   Temp:      TempSrc:      SpO2: 96%  Weight:      Height:      PainSc:  7   Asleep    Isolation Precautions No active isolations  Medications Medications  nicotine (NICODERM CQ - dosed in mg/24 hours) patch 21 mg (21 mg Transdermal Patch Applied 04/26/18 0041)  acetaminophen (TYLENOL) tablet 650 mg (has no administration in time range)    Or  acetaminophen (TYLENOL) suppository 650 mg (has no administration in time range)  ondansetron (ZOFRAN) tablet 4 mg (has no administration in time range)    Or  ondansetron (ZOFRAN) injection 4 mg (has no administration in time range)  HYDROmorphone (DILAUDID) injection 1 mg (has no administration in time range)  ALPRAZolam (XANAX) tablet 0.5 mg (has no administration in time range)  albuterol (PROVENTIL) (2.5 MG/3ML) 0.083% nebulizer solution 3 mL (has no administration in time range)  amLODipine (NORVASC) tablet 5 mg (has no administration in time range)  hydrochlorothiazide (MICROZIDE) capsule 12.5 mg (has no administration in time range)  metoprolol succinate (TOPROL-XL) 24 hr tablet 150 mg (has no administration in time range)  irbesartan (AVAPRO) tablet 75 mg (has no administration in time range)  rosuvastatin (CRESTOR) tablet 10 mg (has no administration in time range)  pantoprazole (PROTONIX) EC tablet 40 mg (has no administration in time  range)  escitalopram (LEXAPRO) tablet 10 mg (has no administration in time range)  fluticasone furoate-vilanterol (BREO ELLIPTA) 100-25 MCG/INH 1 puff (has no administration in time range)  HYDROmorphone (DILAUDID) injection 0.5 mg (0.5 mg Intravenous Given 04/25/18 2106)  HYDROmorphone (DILAUDID) injection 0.5 mg (0.5 mg Intravenous Given 04/25/18 2205)  fentaNYL (SUBLIMAZE) injection 50 mcg (50 mcg Intravenous Given 04/26/18 0041)  HYDROmorphone (DILAUDID) injection 1 mg (1 mg Intravenous Given 04/26/18 0109)    Mobility walks with person assist     Focused Assessments Ortho    R Recommendations: See Admitting Provider Note  Report given to:   Additional Notes: Pt with broken R Elbow. Surgery scheduled for morning. NPO diet status. Arm splinted by ortho tech.

## 2018-04-26 NOTE — Anesthesia Procedure Notes (Signed)
Procedure Name: Intubation Date/Time: 04/26/2018 11:33 AM Performed by: Orlie Dakin, CRNA Pre-anesthesia Checklist: Patient identified, Emergency Drugs available, Suction available and Patient being monitored Patient Re-evaluated:Patient Re-evaluated prior to induction Oxygen Delivery Method: Circle system utilized Preoxygenation: Pre-oxygenation with 100% oxygen Induction Type: IV induction Ventilation: Mask ventilation without difficulty Laryngoscope Size: Miller and 3 Grade View: Grade I Tube type: Oral Tube size: 7.0 mm Number of attempts: 1 Airway Equipment and Method: Stylet Placement Confirmation: ETT inserted through vocal cords under direct vision,  breath sounds checked- equal and bilateral and positive ETCO2 Secured at: 22 cm Tube secured with: Tape Dental Injury: Teeth and Oropharynx as per pre-operative assessment  Comments: 4x4s bite block used.

## 2018-04-26 NOTE — Transfer of Care (Signed)
Immediate Anesthesia Transfer of Care Note  Patient: [name]  Procedure(s) Performed: OPEN REDUCTION INTERNAL FIXATION (ORIF) ELBOW/OLECRANON FRACTURE (Right Elbow)  Patient Location: PACU  Anesthesia Type:General and Regional  Level of Consciousness: awake, oriented and patient cooperative  Airway & Oxygen Therapy: Patient Spontanous Breathing and Patient connected to face mask oxygen  Post-op Assessment: Report given to RN and Post -op Vital signs reviewed and stable  Post vital signs: Reviewed and stable  Last Vitals:  Vitals Value Taken Time  BP 161/68 04/26/2018  2:13 PM  Temp    Pulse 81 04/26/2018  2:14 PM  Resp 12 04/26/2018  2:14 PM  SpO2 100 % 04/26/2018  2:14 PM  Vitals shown include unvalidated device data.  Last Pain:  Vitals:   04/26/18 0800  TempSrc:   PainSc: 0-No pain         Complications: No apparent anesthesia complications

## 2018-04-26 NOTE — Progress Notes (Signed)
Pt no complain of pain her right arm still numb, family at the bedside asking if she can go home tonight or tomorrow, pt able to eat her dinner ,no nausea/vomiting noted.

## 2018-04-26 NOTE — Progress Notes (Signed)
Pt back from OR s/p ORIF of right elbow fracture/dislocation and partial resection of radial head, right arm with sling and ACE wrapped with ice pack, skin warm to touch still numb, nail capillary < 2 sec, her son at the bedside, alert and oriented, no complain of pain at this time, will continue to monitor.

## 2018-04-26 NOTE — Consult Note (Signed)
Orthopaedic Trauma Service (OTS) Consult   Patient ID: [name] MRN: [id number] DOB/AGE: Sep 17, 1950 68 y.o.  Reason for Consult:Right elbow fracture Referring Physician: Dr. Nanda Quinton, MD [facility]  HPI: [name] is an 67 y.o. female who is being seen in consultation at the request of Dr. Laverta Baltimore for evaluation of right elbow fracture.  Patient has a history of coronary artery disease and is on Plavix who presented with right elbow pain and right shoulder pain after a mechanical fall.  She was walking a neighbor's dog when the dog took off and she held onto the leash.  She landed on her shoulder and elbow.  She was brought into the emergency room where x-rays showed a right Monteggia fracture dislocation of her elbow.  I was consulted for evaluation and treatment.  She states that she is having severe pain in her arm.  She was admitted overnight.  She is currently n.p.o.  Patient states that she has history of stents in her heart as well as a bilateral carotid surgeries.  She still smokes pack to a pack and a half of cigarettes a day.  Notes pain in her shoulder.  Denies any pain in bilateral lower extremities or left upper extremity.  Past Medical History:  Diagnosis Date  . CAD (coronary artery disease)   . COPD (chronic obstructive pulmonary disease) ([place])   . GERD (gastroesophageal reflux disease)   . HCV (hepatitis C virus)   . HTN (hypertension)   . Polycythemia   . PVD (peripheral vascular disease) ([place])     Past Surgical History:  Procedure Laterality Date  . CAROTID ENDARTERECTOMY  2007  . CORONARY ANGIOPLASTY WITH STENT PLACEMENT  2010    Family History  Problem Relation Age of Onset  . Heart disease Mother   . Heart disease Father     Social History:  reports that she has been smoking cigarettes. She does not have any smokeless tobacco history on file. She reports current alcohol use. She reports that she does not use  drugs.  Allergies:  Allergies  Allergen Reactions  . Diphenhydramine Hives  . Sulfa Antibiotics Nausea And Vomiting    Medications:  No current facility-administered medications on file prior to encounter.    Current Outpatient Medications on File Prior to Encounter  Medication Sig Dispense Refill  . acetaminophen (TYLENOL) 325 MG tablet Take 325-650 mg by mouth every 6 (six) hours as needed for mild pain or headache.    . albuterol (PROAIR HFA) 108 (90 Base) MCG/ACT inhaler Inhale 2 puffs into the lungs every 6 (six) hours as needed for wheezing or shortness of breath.    . ALPRAZolam (XANAX) 0.5 MG tablet Take 0.5 mg by mouth at bedtime.    Marland Kitchen amLODipine (NORVASC) 5 MG tablet Take 5 mg by mouth daily.    . Biotin 1000 MCG tablet Take 1,000 mcg by mouth daily.    . clopidogrel (PLAVIX) 75 MG tablet Take 75 mg by mouth daily.    . clotrimazole-betamethasone (LOTRISONE) cream Apply 1 application topically 2 (two) times daily as needed (to affected area).    . escitalopram (LEXAPRO) 10 MG tablet Take 10 mg by mouth daily.    . fluticasone furoate-vilanterol (BREO ELLIPTA) 100-25 MCG/INH AEPB Inhale 1 puff into the lungs daily.    . hydrochlorothiazide (MICROZIDE) 12.5 MG capsule Take 12.5 mg by mouth daily.    . lansoprazole (PREVACID) 30 MG capsule Take 30 mg by mouth every other day.    Marland Kitchen  metoprolol succinate (TOPROL-XL) 100 MG 24 hr tablet Take 150 mg by mouth daily. Take with or immediately following a meal.    . nitroGLYCERIN (NITROSTAT) 0.4 MG SL tablet Place 0.4 mg under the tongue every 5 (five) minutes as needed for chest pain.    . rosuvastatin (CRESTOR) 10 MG tablet Take 10 mg by mouth at bedtime.    Marland Kitchen telmisartan (MICARDIS) 20 MG tablet Take 20 mg by mouth every evening.    . vitamin C (ASCORBIC ACID) 500 MG tablet Take 500-1,000 mg by mouth daily.    . vitamin E (VITAMIN E) 400 UNIT capsule Take 400 Units by mouth daily.      ROS: Constitutional: No fever or chills Vision:  No changes in vision ENT: No difficulty swallowing CV: No chest pain Pulm: No SOB or wheezing GI: No nausea or vomiting GU: No urgency or inability to hold urine Skin: No poor wound healing Neurologic: No numbness or tingling Psychiatric: No depression or anxiety Heme: No bruising Allergic: No reaction to medications or food   Exam: Blood pressure (!) 151/64, pulse 62, temperature 98.3 F (36.8 C), temperature source Oral, resp. rate 20, height 5' (1.524 m), weight 52.2 kg, SpO2 94 %. General:NAD Orientation: Awake alert and oriented x3 Mood and Affect: Cooperative and pleasant Gait: Within normal limits Coordination and balance: Within normal limits  Right upper extremity: Splint is in place is clean dry and intact.  Compartments are soft and compressible.  Unable to move elbow secondary to pain in splint.  Shoulder is tender to palpation but no obvious deformity.  No pain about the wrist.  Patient has intact motor and sensory function to median, radial and ulnar nerve distribution.  Warm well-perfused hand with a 2+ radial pulse.  Left upper extremity: Skin without lesions. No tenderness to palpation. Full painless ROM, full strength in each muscle groups without evidence of instability.   Medical Decision Making: Imaging: X-rays of the right elbow show a Monteggia fracture dislocation with associated posterior subluxation of the radial head.  Labs:  Results for orders placed or performed during the hospital encounter of 04/25/18 (from the past 24 hour(s))  CBC with Differential     Status: Abnormal   Collection Time: 04/26/18 12:31 AM  Result Value Ref Range   WBC 13.8 (H) 4.0 - 10.5 K/uL   RBC 4.33 3.87 - 5.11 MIL/uL   Hemoglobin 12.2 12.0 - 15.0 g/dL   HCT 38.8 36.0 - 46.0 %   MCV 89.6 80.0 - 100.0 fL   MCH 28.2 26.0 - 34.0 pg   MCHC 31.4 30.0 - 36.0 g/dL   RDW 13.3 11.5 - 15.5 %   Platelets 263 150 - 400 K/uL   nRBC 0.0 0.0 - 0.2 %   Neutrophils Relative % 78 %    Neutro Abs 10.7 (H) 1.7 - 7.7 K/uL   Lymphocytes Relative 14 %   Lymphs Abs 1.9 0.7 - 4.0 K/uL   Monocytes Relative 8 %   Monocytes Absolute 1.1 (H) 0.1 - 1.0 K/uL   Eosinophils Relative 0 %   Eosinophils Absolute 0.0 0.0 - 0.5 K/uL   Basophils Relative 0 %   Basophils Absolute 0.0 0.0 - 0.1 K/uL   Immature Granulocytes 0 %   Abs Immature Granulocytes 0.03 0.00 - 0.07 K/uL  Basic metabolic panel     Status: Abnormal   Collection Time: 04/26/18 12:31 AM  Result Value Ref Range   Sodium 136 135 - 145 mmol/L  Potassium 3.6 3.5 - 5.1 mmol/L   Chloride 99 98 - 111 mmol/L   CO2 26 22 - 32 mmol/L   Glucose, Bld 112 (H) 70 - 99 mg/dL   BUN 15 8 - 23 mg/dL   Creatinine, Ser 0.68 0.44 - 1.00 mg/dL   Calcium 9.1 8.9 - 10.3 mg/dL   GFR calc non Af Amer >60 >60 mL/min   GFR calc Af Amer >60 >60 mL/min   Anion gap 11 5 - 15  Protime-INR     Status: None   Collection Time: 04/26/18 12:31 AM  Result Value Ref Range   Prothrombin Time 13.7 11.4 - 15.2 seconds   INR 1.1 0.8 - 1.2  APTT     Status: None   Collection Time: 04/26/18 12:31 AM  Result Value Ref Range   aPTT 33 24 - 36 seconds  Surgical pcr screen     Status: None   Collection Time: 04/26/18  4:50 AM  Result Value Ref Range   MRSA, PCR NEGATIVE NEGATIVE   Staphylococcus aureus NEGATIVE NEGATIVE    Medical history and chart was reviewed  Assessment/Plan: 68 year old female with a history of coronary artery disease on Plavix with a significant smoking history with a right elbow fracture dislocation of the olecranon  I recommend proceeding with open reduction internal fixation.  This includes plating of the olecranon with possible repair of the radial head.  Risks and benefits were discussed with the patient.  Risks include but not limited to bleeding, infection, stiffness, posttraumatic arthritis, nerve or blood vessel injury, and possibility of stroke or heart attack. The patient agrees to proceed with surgery and consent was  obtained. Patient likely could discharge postop today vs tomorrow.   Shona Needles, MD [facility] [phone] (phone)

## 2018-04-26 NOTE — Progress Notes (Signed)
Patient admitted after midnight, please see H&P.  Here with right elbow fracture (pateint is left handed).  Plan is for OR this AM.  Patient's home life is complicated by the fact she cares for her 68 yo mother.  For now her sisters will be helping.   Eulogio Bear

## 2018-04-26 NOTE — Progress Notes (Signed)
Pt for OR today for ORIF - right elbow fracture, alert and oriented, CHG , PCR, NPO midnight given pain meds.

## 2018-04-26 NOTE — Anesthesia Postprocedure Evaluation (Signed)
Anesthesia Post Note  Patient: [name]  Procedure(s) Performed: OPEN REDUCTION INTERNAL FIXATION (ORIF) ELBOW/OLECRANON FRACTURE (Right Elbow)     Patient location during evaluation: PACU Anesthesia Type: Regional and General Level of consciousness: sedated and patient cooperative Pain management: pain level controlled Vital Signs Assessment: post-procedure vital signs reviewed and stable Respiratory status: spontaneous breathing Cardiovascular status: stable Anesthetic complications: no    Last Vitals:  Vitals:   04/26/18 1428 04/26/18 1443  BP: (!) 147/61 (!) 146/64  Pulse: 76 68  Resp: 17 16  Temp:  36.7 C  SpO2: 100% 99%    Last Pain:  Vitals:   04/26/18 1443  TempSrc:   PainSc: 0-No pain                 Nolon Nations

## 2018-04-26 NOTE — Op Note (Signed)
Orthopaedic Surgery Operative Note (CSN: [id number] ) Date of Surgery: 04/26/2018  Admit Date: 04/25/2018   Diagnoses: Pre-Op Diagnoses: Right Monteggia fracture/dislocation with anterior radial head fracture   Post-Op Diagnosis: Same  Procedures: 1. CPT 24586-Open reduction internal fixation of right elbow fracture/dislocation 2. CPT 24130-Partial resection of radial head   Surgeons : Primary: Shona Needles, MD  Assistant: Patrecia Pace, PA-C  Location:OR 3   Anesthesia:General   Antibiotics: Ancef 2g preop   Tourniquet time:None  Estimated Blood LOVF:643 mL  Complications:None  Specimens:None   Implants: Implant Name Type Inv. Item Serial No. Manufacturer Lot No. LRB No. Used Action  SCREW PEG 2.5X24 NONLOCK - [id number] Screw SCREW PEG 2.5X24 NONLOCK  [employer](ORTH,TRAU,BIO,SG)  Right 1 Implanted  SCREW PEG 2.5X18 NONLOCK - [id number] Screw SCREW PEG 2.5X18 NONLOCK  [employer](ORTH,TRAU,BIO,SG)  Right 1 Implanted  SCREW PEG 2.5X18 NONLOCK - [id number] Screw SCREW PEG 2.5X18 NONLOCK  [employer](ORTH,TRAU,BIO,SG)  Right 1 Implanted  SCREW PEG 2.5X10 NONLOCK - [id number] Screw SCREW PEG 2.5X10 NONLOCK  [employer](ORTH,TRAU,BIO,SG)  Right 1 Implanted  SCREW PEG LOCK 2.5X10 - [id number] Peg SCREW PEG LOCK 2.5X10  [employer](ORTH,TRAU,BIO,SG)  Right 2 Implanted  SCREW PEG 2.5X20 NONLOCK - [id number] Screw SCREW PEG 2.5X20 NONLOCK  [employer](ORTH,TRAU,BIO,SG)  Right 1 Implanted  SCREW PEG 2.5X16 NONLOCK - [id number] Screw SCREW PEG 2.5X16 NONLOCK  [employer](ORTH,TRAU,BIO,SG)  Right 1 Implanted  PLATE OLECRANON LRG - [id number] Plate PLATE OLECRANON LRG  [employer](ORTH,TRAU,BIO,SG)  Right 1 Implanted  WASHER 3.5MM - [id number] Orthopedic Implant WASHER 3.5MM  [employer](ORTH,TRAU,BIO,SG)  Right 1 Implanted  SCREW CORT LOCK 3.5MM 16MM - [id number] Screw SCREW CORT LOCK 3.5MM 16MM  [employer](ORTH,TRAU,BIO,SG)  Right 1 Implanted  SCREW CORT LOCK 3.5MM 16MM -  [id number] Screw SCREW CORT LOCK 3.5MM 16MM  [employer](ORTH,TRAU,BIO,SG)  Right 2 Implanted  SCREW NON LOCKING LP 3.5 16MM - [id number] Screw SCREW NON LOCKING LP 3.5 16MM  [employer](ORTH,TRAU,BIO,SG)  Right 3 Implanted  SCREW LOW PROFILE 18MMX3.5MM - [id number] Screw SCREW LOW PROFILE 18MMX3.5MM  [employer](ORTH,TRAU,BIO,SG)  Right 1 Implanted  SCREW LOW PROFILE 22MMX3.5MM - [id number] Screw SCREW LOW PROFILE 22MMX3.5MM  [employer](ORTH,TRAU,BIO,SG)  Right 1 Implanted  SCREW LOW PROFILE 22MMX3.5MM - [id number] Screw SCREW LOW PROFILE 22MMX3.5MM  [employer](ORTH,TRAU,BIO,SG)  Right 1 Implanted  SCREW LOCK 3.5X20 DIST TIB - [id number] Screw SCREW LOCK 3.5X20 DIST TIB  [employer](ORTH,TRAU,BIO,SG)  Right 1 Implanted    Indications for Surgery: 68 year old female with a history of coronary artery disease on Plavix with a significant smoking history with a right elbow fracture dislocation of the olecranon. I recommend proceeding with open reduction internal fixation.  This includes plating of the olecranon with possible repair of the radial head.  Risks and benefits were discussed with the patient.  Risks include but not limited to bleeding, infection, stiffness, posttraumatic arthritis, nerve or blood vessel injury, and possibility of stroke or heart attack. The patient agrees to proceed with surgery and consent was obtained.  Operative Findings: 1.  Right Monteggia fracture dislocation with proximal ulna and associated radial head fracture treated with open reduction and internal fixation of right proximal ulna using a [employer] 2.5 mm mini frag plate with a 2.5 mm independent positional screw for the separate coronoid fragment as well as a Neurosurgeon Alps olecranon plate 2.  Resection of anterior fragment of the radial head.  Elbow remained stable following fixation of the ulna without any subluxation or blocks to motion.  As result no radial head arthroplasty was  performed.  Procedure: The patient was identified in the preoperative holding area. Consent was confirmed with the patient and their family and all questions were answered. The operative extremity was marked after confirmation with the patient. she was then brought back to the operating room by our anesthesia colleagues.  She was carefully transferred over to a radiolucent flat top table.  She was placed under general anesthetic and placed in lateral decubitus position with the operative side up.  All bony prominences were well-padded and axillary roll was placed to take pressure off of her brachial plexus. The operative extremity was then prepped and draped in usual sterile fashion. A preoperative timeout was performed to verify the patient, the procedure, and the extremity. Preoperative antibiotics were dosed.  Fluoroscopy was obtained to show the displacement and unstable nature of the radial head and the proximal ulna.  A posterior approach was made to the proximal ulna this carried through skin and subcutaneous tissue down to the fascia.  The fascia was incised along the length of the incision.  The fracture fragments were exposed.  Through the proximal ulna fracture I was able to visualize the radial head.  Approximately 75% of the radial head was still intact with a small portion that was fractured.  I was able to access this and remove this through the fracture.  I irrigated the joint for removal of all loose bodies.  The annular ligament was still in place and connected to the radial neck.  Once I cleaned out the radial head and the joint I then turned my attention to fixation of the proximal ulna.  There is a large coronoid fragment along with a shaft component and the olecranon component with associated lateral fragment.  I was able to reduce this to provisionally hold this with clamps.  A 2.5 mm positional screw was placed from the olecranon into the coronoid to provide provisional reduction.  I  then used an 8 hole mini frag plate along the medial cortex to provisionally hold the shaft to the olecranon along with the coronoid fragment.  I clamped and reduce the lateral cortex to the main fragments as well and then chose a large olecranon plate.  I was able to place nonlocking screws into the ulnar shaft.  Mixture of nonlocking and locking screws were placed in the proximal segment to complete the construct.  Good fixation was able to be obtained with the coronoid fragment.  Fluoroscopic images were obtained.  Which showed that the ulnohumeral joint was stable and the radial head was stable as well.  At this point I felt that I did not pursue need to proceed with radial head replacement as she had no block to motion and had excellent range of motion in flexion extension and supination and pronation.  At this point I then thoroughly irrigated the incision.  I placed 1 g of vancomycin powder into the wound.  I then closed the fascia with 0 Vicryl suture.  The skin was closed 2-0 Vicryl and 3-0 nylon.  Sterile dressing consisting of bacitracin ointment, Adaptic, 4 x 4's and sterile cast padding was placed.  A long-arm splint was placed to the arm.  She was awoken from anesthesia and taken to the PACU in stable condition.  Post Op Plan/Instructions: Patient will be nonweightbearing to the right upper extremity.  She will maintain in her splint until I see her back in clinic.  She received postoperative Ancef.  She  may be restarted on her Plavix postoperative day 1.  There are no DVT prophylaxis needed from this upper extremity orthopedic injury.  Plan to see her back in 7 to 10 days.  I was present and performed the entire surgery.  Patrecia Pace, PA-C did assist me throughout the case. An assistant was necessary given the difficulty in approach, maintenance of reduction and ability to instrument the fracture.   Katha Hamming, MD [facility]

## 2018-04-26 NOTE — Anesthesia Preprocedure Evaluation (Addendum)
Anesthesia Evaluation  Patient identified by MRN, date of birth, ID band Patient awake    Reviewed: Allergy & Precautions, H&P , NPO status , Patient's Chart, lab work & pertinent test results  Airway Mallampati: II  TM Distance: >3 FB Neck ROM: Full    Dental  (+) Dental Advisory Given, Poor Dentition   Pulmonary COPD, Current Smoker,    breath sounds clear to auscultation       Cardiovascular hypertension, Pt. on medications and Pt. on home beta blockers + CAD, + Cardiac Stents and + Peripheral Vascular Disease   Rhythm:Regular Rate:Normal     Neuro/Psych negative neurological ROS     GI/Hepatic GERD  Medicated,(+) Hepatitis -, C  Endo/Other  negative endocrine ROS  Renal/GU negative Renal ROS  negative genitourinary   Musculoskeletal negative musculoskeletal ROS (+)   Abdominal Normal abdominal exam  (+)   Peds  Hematology negative hematology ROS (+)   Anesthesia Other Findings   Reproductive/Obstetrics                         Anesthesia Physical Anesthesia Plan  ASA: III  Anesthesia Plan: General   Post-op Pain Management: GA combined w/ Regional for post-op pain   Induction: Intravenous  PONV Risk Score and Plan: 3 and Ondansetron, Dexamethasone and Midazolam  Airway Management Planned: Oral ETT  Additional Equipment: None  Intra-op Plan:   Post-operative Plan: Extubation in OR  Informed Consent: I have reviewed the patients History and Physical, chart, labs and discussed the procedure including the risks, benefits and alternatives for the proposed anesthesia with the patient or authorized representative who has indicated his/her understanding and acceptance.     Dental advisory given  Plan Discussed with: CRNA  Anesthesia Plan Comments:        Anesthesia Quick Evaluation

## 2018-04-27 DIAGNOSIS — S52201A Unspecified fracture of shaft of right ulna, initial encounter for closed fracture: Secondary | ICD-10-CM | POA: Diagnosis not present

## 2018-04-27 DIAGNOSIS — S5291XA Unspecified fracture of right forearm, initial encounter for closed fracture: Secondary | ICD-10-CM | POA: Diagnosis not present

## 2018-04-27 LAB — CBC
HCT: 27.6 % — ABNORMAL LOW (ref 36.0–46.0)
Hemoglobin: 8.9 g/dL — ABNORMAL LOW (ref 12.0–15.0)
MCH: 29 pg (ref 26.0–34.0)
MCHC: 32.2 g/dL (ref 30.0–36.0)
MCV: 89.9 fL (ref 80.0–100.0)
Platelets: 196 10*3/uL (ref 150–400)
RBC: 3.07 MIL/uL — ABNORMAL LOW (ref 3.87–5.11)
RDW: 13.5 % (ref 11.5–15.5)
WBC: 9.9 10*3/uL (ref 4.0–10.5)
nRBC: 0 % (ref 0.0–0.2)

## 2018-04-27 MED ORDER — NICOTINE 14 MG/24HR TD PT24: 14.0000 mg | MEDICATED_PATCH | Freq: Every day | TRANSDERMAL | Status: DC

## 2018-04-27 MED ORDER — NICOTINE 14 MG/24HR TD PT24
14.0000 mg | MEDICATED_PATCH | Freq: Every day | TRANSDERMAL | Status: DC
  Administered 2018-04-27: 14 mg via TRANSDERMAL
  Filled 2018-04-27 (×2): qty 1

## 2018-04-27 MED ORDER — OXYCODONE HCL 5 MG PO TABS: 5.0000 mg | ORAL_TABLET | ORAL | 0 refills | Status: AC | PRN

## 2018-04-27 NOTE — Progress Notes (Signed)
Discharged home today accompanied by patient's son. Discharged instructions ,personal belongings given to patient. Verbalized understanding of instructions

## 2018-04-27 NOTE — Care Management Obs Status (Signed)
[place] NOTIFICATION   Patient Details  Name: [name] MRN: [id number] Date of Birth: 07/17/1950   Medicare Observation Status Notification Given:  Yes    Claudie Leach, RN 04/27/2018, 12:09 PM

## 2018-04-27 NOTE — Progress Notes (Signed)
Orthopedic Tech Progress Note Patient Details:  [name] [facility] 02-15-1951 [id number]  Ortho Devices Type of Ortho Device: Knee Sleeve Ortho Device/Splint Interventions: Application   Post Interventions Patient Tolerated: Well Instructions Provided: Care of device   [name] 04/27/2018, 9:17 AM

## 2018-04-27 NOTE — Progress Notes (Signed)
SPORTS MEDICINE AND JOINT REPLACEMENT  Lara Mulch, MD    Carlyon Shadow, PA-C [address], [place], [place]  [address]                             [phone]   PROGRESS NOTE  Subjective:  negative for Chest Pain  negative for Shortness of Breath  negative for Nausea/Vomiting   negative for Calf Pain  negative for Bowel Movement   Tolerating Diet: yes         Patient reports pain as 2 on 0-10 scale.    Objective: Vital signs in last 24 hours:    Patient Vitals for the past 24 hrs:  BP Temp Temp src Pulse Resp SpO2 Height Weight  04/27/18 0545 (!) 128/44 98.1 F (36.7 C) Oral 61 18 97 % - -  04/27/18 0230 121/62 97.9 F (36.6 C) Oral (!) 53 18 97 % - -  04/26/18 2117 (!) 118/59 99.4 F (37.4 C) Oral (!) 59 18 99 % - -  04/26/18 1443 (!) 146/64 98.1 F (36.7 C) - 68 16 99 % - -  04/26/18 1428 (!) 147/61 - - 76 17 100 % - -  04/26/18 1415 (!) 161/68 98.1 F (36.7 C) - 81 12 100 % - -  04/26/18 1413 - - - 91 18 100 % - -  04/26/18 1019 - - - - - - 5' (1.524 m) 52.2 kg    @flow {1959:LAST@   Intake/Output from previous day:   03/06 0701 - 03/07 0700 In: 2080.1 [P.O.:680; I.V.:1200] Out: -    Intake/Output this shift:   No intake/output data recorded.   Intake/Output      03/06 0701 - 03/07 0700 03/07 0701 - 03/08 0700   P.O. 680    I.V. (mL/kg) 1200 (23)    IV Piggyback 200.1    Total Intake(mL/kg) 2080.1 (39.8)    Net +2080.1         Urine Occurrence 2 x       LABORATORY DATA: Recent Labs    04/26/18 0031 04/27/18 0312  WBC 13.8* 9.9  HGB 12.2 8.9*  HCT 38.8 27.6*  PLT 263 196   Recent Labs    04/26/18 0031  NA 136  K 3.6  CL 99  CO2 26  BUN 15  CREATININE 0.68  GLUCOSE 112*  CALCIUM 9.1   Lab Results  Component Value Date   INR 1.1 04/26/2018    Examination:  General appearance: alert, cooperative and no distress Extremities: extremities normal, atraumatic, no cyanosis or edema  Wound Exam: clean, dry, intact    Drainage:  None: wound tissue dry  Motor Exam: Opposition, Pinch and Wrist Dorsiflexion Intact  Sensory Exam: Radial, Ulnar and Median normal   Assessment:    1 Day Post-Op  Procedure(s) (LRB): OPEN REDUCTION INTERNAL FIXATION (ORIF) ELBOW/OLECRANON FRACTURE (Right)  ADDITIONAL DIAGNOSIS:  Principal Problem:   Radius/ulna fracture, right, closed, initial encounter Active Problems:   HTN (hypertension)   CAD (coronary artery disease)   Polycythemia vera (HCC)   COPD (chronic obstructive pulmonary disease) ([place])     Plan: Patient doing well, nerve block from pre-op still controlling pain. Plan to D/C home today. Follow up in office as needed.     Donia Ast 04/27/2018, 8:31 AM

## 2018-04-27 NOTE — Evaluation (Signed)
Occupational Therapy Evaluation Patient Details Name: [name] MRN: [id number] DOB: Sep 12, 1950 Today's Date: 04/27/2018    History of Present Illness Pt is a 68 y/o female with PMH of CAD, HTN, PVD. Presents to ED after mechanical fall and R elbow pain. S/P ORIF R elbow 04/26/18.    Clinical Impression   PTA patient independent, taking care of her mother. Patient admitted for above and limited by R UE weakness/pain, decreased activity tolerance.  Patient educated on precautions, safety, ADL compensatory techniques, recommendations, and edema reduction/exercises to RUE.  Patient able to complete UB ADls with min assist, LB ADLs with supervision, transfers with supervision, and simulated tub transfers with min guard assist. Patient will benefit from continued OT services while admitted in order to optimize independence and safety with ADLs, but anticipate no further needs after dc.  Will follow.     Follow Up Recommendations  No OT follow up;Follow surgeon's recommendation for DC plan and follow-up therapies;Supervision - Intermittent    Equipment Recommendations  None recommended by OT    Recommendations for Other Services PT consult     Precautions / Restrictions Precautions Precautions: Fall Restrictions Weight Bearing Restrictions: Yes RUE Weight Bearing: Non weight bearing      Mobility Bed Mobility Overal bed mobility: Modified Independent             General bed mobility comments: no assist required  Transfers Overall transfer level: Needs assistance Equipment used: None Transfers: Sit to/from Stand Sit to Stand: Supervision         General transfer comment: supervision for safety     Balance Overall balance assessment: Mild deficits observed, not formally tested                                         ADL either performed or assessed with clinical judgement   ADL Overall ADL's : Needs assistance/impaired Eating/Feeding:  Set up;Sitting   Grooming: Sitting;Set up   Upper Body Bathing: Minimal assistance;Sitting   Lower Body Bathing: Supervison/ safety;Sit to/from stand   Upper Body Dressing : Minimal assistance;Sitting   Lower Body Dressing: Set up;Sit to/from stand   Toilet Transfer: Supervision/safety;Set up;Ambulation Toilet Transfer Details (indicate cue type and reason): simulated to/from EOB     Tub/ Shower Transfer: Tub transfer;Shower seat;Ambulation;Min guard;Grab bars   Functional mobility during ADLs: Supervision/safety General ADL Comments: reviewed ADl compensatory techniques and safety, requires increased assist due to decreased functional use of R UE      Vision Baseline Vision/History: Wears glasses Wears Glasses: Reading only Patient Visual Report: No change from baseline       Perception     Praxis      Pertinent Vitals/Pain Pain Assessment: Faces Faces Pain Scale: Hurts a little bit Pain Location: R UE, R knee  Pain Descriptors / Indicators: Discomfort;Operative site guarding Pain Intervention(s): Monitored during session;Repositioned;Ice applied     Hand Dominance Left   Extremity/Trunk Assessment Upper Extremity Assessment Upper Extremity Assessment: RUE deficits/detail RUE Deficits / Details: s/p ORIF R elbow, nerve block intact RUE Coordination: decreased fine motor;decreased gross motor   Lower Extremity Assessment Lower Extremity Assessment: Defer to PT evaluation   Cervical / Trunk Assessment Cervical / Trunk Assessment: Normal   Communication Communication Communication: No difficulties   Cognition Arousal/Alertness: Awake/alert Behavior During Therapy: WFL for tasks assessed/performed Overall Cognitive Status: Within Functional Limits for tasks assessed  General Comments  son present and supportive    Exercises Exercises: Other exercises Other Exercises Other Exercises: reviewed ice and  elevation, sling mgmt for edema reduction  Other Exercises: completed 5 reps 1 set of AROM finger ROM, wrist flex/ext; PROM shoulder FF to 90 and abd to 45    Shoulder Instructions      Home Living Family/patient expects to be discharged to:: Private residence Living Arrangements: Parent(takes care of her mother, but will be alone initally ) Available Help at Discharge: Family Type of Home: House Home Access: Stairs to enter Technical brewer of Steps: 3 Entrance Stairs-Rails: Left Home Layout: One level     Bathroom Shower/Tub: Teacher, early years/pre: Handicapped height     Home Equipment: Grab bars - tub/shower;Grab bars - toilet;Shower seat;Walker - 2 wheels          Prior Functioning/Environment Level of Independence: Independent        Comments: independent, driving, takes care of her mother         OT Problem List: Decreased strength;Decreased range of motion;Decreased activity tolerance;Pain;Impaired UE functional use;Increased edema;Decreased knowledge of precautions;Decreased knowledge of use of DME or AE;Decreased safety awareness;Decreased coordination      OT Treatment/Interventions: Self-care/ADL training;Therapeutic exercise;Energy conservation;DME and/or AE instruction;Therapeutic activities;Patient/family education;Balance training    OT Goals(Current goals can be found in the care plan section) Acute Rehab OT Goals Patient Stated Goal: to get home  OT Goal Formulation: With patient Time For Goal Achievement: 05/11/18 Potential to Achieve Goals: Good  OT Frequency: Min 2X/week   Barriers to D/C:            Co-evaluation              AM-PAC OT "6 Clicks" Daily Activity     Outcome Measure Help from another person eating meals?: A Little Help from another person taking care of personal grooming?: A Little Help from another person toileting, which includes using toliet, bedpan, or urinal?: A Little Help from another person  bathing (including washing, rinsing, drying)?: A Little Help from another person to put on and taking off regular upper body clothing?: A Little Help from another person to put on and taking off regular lower body clothing?: A Little 6 Click Score: 18   End of Session Equipment Utilized During Treatment: Other (comment)(sling) Nurse Communication: Mobility status  Activity Tolerance: Patient tolerated treatment well Patient left: with call bell/phone within reach;with family/visitor present;Other (comment)(seated EOB)  OT Visit Diagnosis: Pain Pain - Right/Left: Right Pain - part of body: Arm;Knee                Time: 0821-0850 OT Time Calculation (min): 29 min Charges:  OT General Charges $OT Visit: 1 Visit OT Evaluation $OT Eval Low Complexity: 1 Low OT Treatments $Self Care/Home Management : 8-22 mins  Delight Stare, OT Acute Rehabilitation Services Pager [phone] Office [phone]    Delight Stare 04/27/2018, 8:58 AM

## 2018-04-27 NOTE — Discharge Summary (Signed)
Physician Discharge Summary  [name] HKV:[id number] DOB: [birth year] DOA: 04/25/2018  PCP: Patient, No Pcp Per  Admit date: 04/25/2018 Discharge date: 04/27/2018  Admitted From: home Discharge disposition: home   Recommendations for Outpatient Follow-Up:   Pain control and bowel regimen If continue to have right knee issues, may need further imaging  Discharge Diagnosis:   Principal Problem:   Radius/ulna fracture, right, closed, initial encounter Active Problems:   HTN (hypertension)   CAD (coronary artery disease)   Polycythemia vera (HCC)   COPD (chronic obstructive pulmonary disease) ([place])    Discharge Condition: Improved.  Diet recommendation: Low sodium, heart healthy  Wound care: None.  Code status: Full.   History of Present Illness:   [name] is a 68 y.o. female with medical history significant of CAD s/p 2 stents in 2010, currently on plavix, HTN, PCV gets occasional phlebotomies with Heme/onc last in Jan.  Patient presents to the ED with R elbow pain.  Severe, radiates up shoulder and neck.  Symptoms onset after mechanical fall that occurred while walking neighbors dog (dog took off, she was holding leash and got pulled off of her feet).   Hospital Course by Problem:   traumatic Radius/ulna fx - -Dr. Doreatha Martin will try and work in to surgery later today  -s/p surgery  -outpatient follow up  PCV - -outpatient follow up  HTN - continue home BP meds    Medical Consultants:    ortho  Discharge Exam:   Vitals:   04/27/18 0545 04/27/18 0853  BP: (!) 128/44   Pulse: 61   Resp: 18   Temp: 98.1 F (36.7 C)   SpO2: 97% 96%   Vitals:   04/26/18 2117 04/27/18 0230 04/27/18 0545 04/27/18 0853  BP: (!) 118/59 121/62 (!) 128/44   Pulse: (!) 59 (!) 53 61   Resp: 18 18 18    Temp: 99.4 F (37.4 C) 97.9 F (36.6 C) 98.1 F (36.7 C)   TempSrc: Oral Oral Oral   SpO2: 99% 97% 97% 96%  Weight:      Height:          General exam: Appears calm and comfortable.   The results of significant diagnostics from this hospitalization (including imaging, microbiology, ancillary and laboratory) are listed below for reference.     Procedures and Diagnostic Studies:   Dg Shoulder Right  Result Date: 04/25/2018 CLINICAL DATA:  Fall EXAM: RIGHT SHOULDER - 2+ VIEW COMPARISON:  None. FINDINGS: No fracture or malalignment. Mild AC joint degenerative change. The right lung apex is clear. IMPRESSION: No acute osseous abnormality Electronically Signed   By: Donavan Foil M.D.   On: 04/25/2018 21:33   Dg Elbow 2 Views Right  Result Date: 04/26/2018 CLINICAL DATA:  Status postoperative fixation of an olecranon fracture. EXAM: RIGHT ELBOW - 2 VIEW COMPARISON:  Yesterday. FINDINGS: Interval screw and plate fixation of the previously demonstrated proximal ulna fracture with essentially anatomic position and alignment. And anterior radial head fracture is again demonstrated with an anteriorly and distally displaced fragment. IMPRESSION: Hardware fixation of the previously demonstrated proximal ulna fracture with essentially anatomic position and alignment. Electronically Signed   By: Claudie Revering M.D.   On: 04/26/2018 15:12   Dg Elbow 2 Views Right  Result Date: 04/25/2018 CLINICAL DATA:  Fall, arm pain EXAM: RIGHT ELBOW - 2 VIEW COMPARISON:  None. FINDINGS: Protuberant soft tissue swelling about the elbow. Acute comminuted intra-articular fracture involving the proximal ulna with  multiple displaced fracture fragments. Acute comminuted and impacted fracture involving the radial head and possibly the proximal shaft of the radius with displacement of the bone fragment containing the radial head. IMPRESSION: Marked soft tissue swelling with acute comminuted and displaced complex proximal radius and ulna fractures, difficult to completely characterize due to positioning and overlap of osseous structures. Electronically Signed   By: Donavan Foil M.D.   On: 04/25/2018 21:32   Dg Elbow Complete Right  Result Date: 04/26/2018 CLINICAL DATA:  68 y/o  F; ORIF of right elbow/olecranon fracture. EXAM: DG C-ARM 61-120 MIN; RIGHT ELBOW - COMPLETE 3+ VIEW COMPARISON:  04/25/2018 right upper extremity radiographs FINDINGS: Eleven intraoperative fluoroscopic images demonstrating plate and screw fixation of a right elbow/olecranon fracture in good alignment post fixation. Fluoro time is 1 minutes and 42 seconds. IMPRESSION: Intraoperative fluoroscopy of right elbow/olecranon ORIF. Fluoro time is 1 minutes and 42 seconds. Electronically Signed   By: Kristine Garbe M.D.   On: 04/26/2018 13:58   Ct Head Wo Contrast  Result Date: 04/25/2018 CLINICAL DATA:  Head trauma, minor, GCS>=13, high clinical risk, initial exam; C-spine trauma, high clinical risk (NEXUS/CCR). Mechanical fall walking dog, falling onto concrete. EXAM: CT HEAD WITHOUT CONTRAST CT CERVICAL SPINE WITHOUT CONTRAST TECHNIQUE: Multidetector CT imaging of the head and cervical spine was performed following the standard protocol without intravenous contrast. Multiplanar CT image reconstructions of the cervical spine were also generated. COMPARISON:  None. FINDINGS: CT HEAD FINDINGS Brain: Generalized atrophy, slightly advanced for age. No intracranial hemorrhage, mass effect, or midline shift. No hydrocephalus. The basilar cisterns are patent. No evidence of territorial infarct or acute ischemia. No extra-axial or intracranial fluid collection. Vascular: Atherosclerosis of skullbase vasculature without hyperdense vessel or abnormal calcification. Skull: No fracture or focal lesion. Sinuses/Orbits: Trace paranasal sinus mucosal thickening without fluid level. Mastoid air cells are clear. Visualized orbits are unremarkable. Other: None. CT CERVICAL SPINE FINDINGS Alignment: Normal. Skull base and vertebrae: No acute fracture. Vertebral body heights are maintained. The dens and skull base  are intact. Partial ankylosis of posterior elements of C2-C3. Soft tissues and spinal canal: No prevertebral fluid or swelling. No visible canal hematoma. Disc levels: Disc space narrowing and endplate spurring at V2-N1 with endplate changes at C7. Remaining disc spaces are preserved. Upper chest: No acute findings. Other: Carotid calcifications. IMPRESSION: 1. No acute intracranial abnormality. No skull fracture. 2. Degenerative change in the cervical spine without acute fracture or subluxation. 3. Carotid and skullbase atherosclerosis. Electronically Signed   By: Keith Rake M.D.   On: 04/25/2018 21:08   Ct Cervical Spine Wo Contrast  Result Date: 04/25/2018 CLINICAL DATA:  Head trauma, minor, GCS>=13, high clinical risk, initial exam; C-spine trauma, high clinical risk (NEXUS/CCR). Mechanical fall walking dog, falling onto concrete. EXAM: CT HEAD WITHOUT CONTRAST CT CERVICAL SPINE WITHOUT CONTRAST TECHNIQUE: Multidetector CT imaging of the head and cervical spine was performed following the standard protocol without intravenous contrast. Multiplanar CT image reconstructions of the cervical spine were also generated. COMPARISON:  None. FINDINGS: CT HEAD FINDINGS Brain: Generalized atrophy, slightly advanced for age. No intracranial hemorrhage, mass effect, or midline shift. No hydrocephalus. The basilar cisterns are patent. No evidence of territorial infarct or acute ischemia. No extra-axial or intracranial fluid collection. Vascular: Atherosclerosis of skullbase vasculature without hyperdense vessel or abnormal calcification. Skull: No fracture or focal lesion. Sinuses/Orbits: Trace paranasal sinus mucosal thickening without fluid level. Mastoid air cells are clear. Visualized orbits are unremarkable. Other: None. CT CERVICAL SPINE  FINDINGS Alignment: Normal. Skull base and vertebrae: No acute fracture. Vertebral body heights are maintained. The dens and skull base are intact. Partial ankylosis of  posterior elements of C2-C3. Soft tissues and spinal canal: No prevertebral fluid or swelling. No visible canal hematoma. Disc levels: Disc space narrowing and endplate spurring at X9-B7 with endplate changes at C7. Remaining disc spaces are preserved. Upper chest: No acute findings. Other: Carotid calcifications. IMPRESSION: 1. No acute intracranial abnormality. No skull fracture. 2. Degenerative change in the cervical spine without acute fracture or subluxation. 3. Carotid and skullbase atherosclerosis. Electronically Signed   By: Keith Rake M.D.   On: 04/25/2018 21:08   Dg Chest Portable 1 View  Result Date: 04/26/2018 CLINICAL DATA:  Clearance. Preop. Elbow fracture. EXAM: PORTABLE CHEST 1 VIEW COMPARISON:  None. FINDINGS: The cardiomediastinal contours are normal. Coronary stent visualized. Atherosclerosis of the aortic arch. Pulmonary vasculature is normal. No consolidation, pleural effusion, or pneumothorax. No acute osseous abnormalities are seen. IMPRESSION: 1. No acute cardiopulmonary abnormality. 2. Aortic atherosclerosis. Electronically Signed   By: Keith Rake M.D.   On: 04/26/2018 00:26   Dg Knee Right Port  Result Date: 04/26/2018 CLINICAL DATA:  Fall EXAM: PORTABLE RIGHT KNEE - 1-2 VIEW COMPARISON:  None. FINDINGS: No evidence of fracture, dislocation, or joint effusion. No evidence of arthropathy or other focal bone abnormality. Soft tissues are unremarkable. IMPRESSION: Negative. Electronically Signed   By: Donavan Foil M.D.   On: 04/26/2018 21:41   Dg C-arm 1-60 Min  Result Date: 04/26/2018 CLINICAL DATA:  68 y/o  F; ORIF of right elbow/olecranon fracture. EXAM: DG C-ARM 61-120 MIN; RIGHT ELBOW - COMPLETE 3+ VIEW COMPARISON:  04/25/2018 right upper extremity radiographs FINDINGS: Eleven intraoperative fluoroscopic images demonstrating plate and screw fixation of a right elbow/olecranon fracture in good alignment post fixation. Fluoro time is 1 minutes and 42 seconds.  IMPRESSION: Intraoperative fluoroscopy of right elbow/olecranon ORIF. Fluoro time is 1 minutes and 42 seconds. Electronically Signed   By: Kristine Garbe M.D.   On: 04/26/2018 13:58     Labs:   Basic Metabolic Panel: Recent Labs  Lab 04/26/18 0031  NA 136  K 3.6  CL 99  CO2 26  GLUCOSE 112*  BUN 15  CREATININE 0.68  CALCIUM 9.1   GFR Estimated Creatinine Clearance: 49 mL/min (by C-G formula based on SCr of 0.68 mg/dL). Liver Function Tests: No results for input(s): AST, ALT, ALKPHOS, BILITOT, PROT, ALBUMIN in the last 168 hours. No results for input(s): LIPASE, AMYLASE in the last 168 hours. No results for input(s): AMMONIA in the last 168 hours. Coagulation profile Recent Labs  Lab 04/26/18 0031  INR 1.1    CBC: Recent Labs  Lab 04/26/18 0031 04/27/18 0312  WBC 13.8* 9.9  NEUTROABS 10.7*  --   HGB 12.2 8.9*  HCT 38.8 27.6*  MCV 89.6 89.9  PLT 263 196   Cardiac Enzymes: No results for input(s): CKTOTAL, CKMB, CKMBINDEX, TROPONINI in the last 168 hours. BNP: Invalid input(s): POCBNP CBG: No results for input(s): GLUCAP in the last 168 hours. D-Dimer No results for input(s): DDIMER in the last 72 hours. Hgb A1c No results for input(s): HGBA1C in the last 72 hours. Lipid Profile No results for input(s): CHOL, HDL, LDLCALC, TRIG, CHOLHDL, LDLDIRECT in the last 72 hours. Thyroid function studies No results for input(s): TSH, T4TOTAL, T3FREE, THYROIDAB in the last 72 hours.  Invalid input(s): FREET3 Anemia work up No results for input(s): VITAMINB12, FOLATE, FERRITIN, TIBC, IRON,  RETICCTPCT in the last 72 hours. Microbiology Recent Results (from the past 240 hour(s))  Surgical pcr screen     Status: None   Collection Time: 04/26/18  4:50 AM  Result Value Ref Range Status   MRSA, PCR NEGATIVE NEGATIVE Final   Staphylococcus aureus NEGATIVE NEGATIVE Final    Comment: (NOTE) The Xpert SA Assay (FDA approved for NASAL specimens in patients  39 years of age and older), is one component of a comprehensive surveillance program. It is not intended to diagnose infection nor to guide or monitor treatment. Performed at [facility], [place] [address], [place], [place] [address]      Discharge Instructions:   Discharge Instructions    Diet - low sodium heart healthy   Complete by:  As directed    Discharge instructions   Complete by:  As directed    Bowel regimen while taking pain medications   Increase activity slowly   Complete by:  As directed      Allergies as of 04/27/2018      Reactions   Diphenhydramine Hives   Sulfa Antibiotics Nausea And Vomiting      Medication List    TAKE these medications   acetaminophen 325 MG tablet Commonly known as:  TYLENOL Take 325-650 mg by mouth every 6 (six) hours as needed for mild pain or headache.   ALPRAZolam 0.5 MG tablet Commonly known as:  XANAX Take 0.5 mg by mouth at bedtime.   amLODipine 5 MG tablet Commonly known as:  NORVASC Take 5 mg by mouth daily.   Biotin 1000 MCG tablet Take 1,000 mcg by mouth daily.   Breo Ellipta 100-25 MCG/INH Aepb Generic drug:  fluticasone furoate-vilanterol Inhale 1 puff into the lungs daily.   clopidogrel 75 MG tablet Commonly known as:  PLAVIX Take 75 mg by mouth daily.   clotrimazole-betamethasone cream Commonly known as:  LOTRISONE Apply 1 application topically 2 (two) times daily as needed (to affected area).   escitalopram 10 MG tablet Commonly known as:  LEXAPRO Take 10 mg by mouth daily.   hydrochlorothiazide 12.5 MG capsule Commonly known as:  MICROZIDE Take 12.5 mg by mouth daily.   lansoprazole 30 MG capsule Commonly known as:  PREVACID Take 30 mg by mouth every other day.   metoprolol succinate 100 MG 24 hr tablet Commonly known as:  TOPROL-XL Take 150 mg by mouth daily. Take with or immediately following a meal.   nitroGLYCERIN 0.4 MG SL tablet Commonly known as:  NITROSTAT Place 0.4 mg under  the tongue every 5 (five) minutes as needed for chest pain.   oxyCODONE 5 MG immediate release tablet Commonly known as:  Oxy IR/ROXICODONE Take 1-2 tablets (5-10 mg total) by mouth every 4 (four) hours as needed for up to 5 days for moderate pain.   ProAir HFA 108 (90 Base) MCG/ACT inhaler Generic drug:  albuterol Inhale 2 puffs into the lungs every 6 (six) hours as needed for wheezing or shortness of breath.   rosuvastatin 10 MG tablet Commonly known as:  CRESTOR Take 10 mg by mouth at bedtime.   telmisartan 20 MG tablet Commonly known as:  MICARDIS Take 20 mg by mouth every evening.   vitamin C 500 MG tablet Commonly known as:  ASCORBIC ACID Take 500-1,000 mg by mouth daily.   vitamin E 400 UNIT capsule Generic drug:  vitamin E Take 400 Units by mouth daily.      Follow-up Information    Haddix, Lennette Bihari  P, MD. Schedule an appointment as soon as possible for a visit in 2 week(s).   Specialty:  Orthopedic Surgery Why:  suture removal and repeat x-rays Contact information: [place] [address] [phone]            Time coordinating discharge: 25 min  Signed:  Geradine Girt DO  [facility] 04/27/2018, 9:27 AM

## 2018-04-29 ENCOUNTER — Encounter: Payer: Self-pay | Admitting: Internal Medicine

## 2018-04-29 ENCOUNTER — Encounter (HOSPITAL_COMMUNITY): Payer: Self-pay | Admitting: Student

## 2018-05-02 ENCOUNTER — Encounter: Payer: Self-pay | Admitting: Student

## 2018-05-02 DIAGNOSIS — S52271A Monteggia's fracture of right ulna, initial encounter for closed fracture: Secondary | ICD-10-CM | POA: Insufficient documentation

## 2018-05-06 ENCOUNTER — Telehealth: Payer: Self-pay | Admitting: Interventional Cardiology

## 2018-05-06 NOTE — Telephone Encounter (Signed)
Called pt in regards to appt on 05/07/2018.  We are asking about cardiac symptoms or if pt has had fever or chills in order to best prevent spread of COVID 19.   Left message to call back.

## 2018-05-06 NOTE — Telephone Encounter (Signed)
Spoke with pt.  She's having no issues and wished to reschedule her appt.  Scheduled pt to be seen 5/13.

## 2018-05-07 ENCOUNTER — Ambulatory Visit: Payer: PPO | Admitting: Interventional Cardiology

## 2018-05-07 DIAGNOSIS — M25562 Pain in left knee: Secondary | ICD-10-CM | POA: Diagnosis not present

## 2018-05-07 DIAGNOSIS — S52271D Monteggia's fracture of right ulna, subsequent encounter for closed fracture with routine healing: Secondary | ICD-10-CM | POA: Diagnosis not present

## 2018-05-07 DIAGNOSIS — M25551 Pain in right hip: Secondary | ICD-10-CM | POA: Diagnosis not present

## 2018-05-21 DIAGNOSIS — S52271D Monteggia's fracture of right ulna, subsequent encounter for closed fracture with routine healing: Secondary | ICD-10-CM | POA: Diagnosis not present

## 2018-06-13 ENCOUNTER — Inpatient Hospital Stay: Payer: PPO | Attending: Hematology & Oncology | Admitting: Hematology & Oncology

## 2018-06-13 ENCOUNTER — Inpatient Hospital Stay: Payer: PPO

## 2018-06-13 ENCOUNTER — Other Ambulatory Visit: Payer: Self-pay

## 2018-06-13 VITALS — BP 156/59 | HR 59 | Resp 17 | Ht 60.0 in | Wt 113.0 lb

## 2018-06-13 DIAGNOSIS — D45 Polycythemia vera: Secondary | ICD-10-CM | POA: Insufficient documentation

## 2018-06-13 DIAGNOSIS — Z720 Tobacco use: Secondary | ICD-10-CM

## 2018-06-13 LAB — CMP (CANCER CENTER ONLY)
ALT: 9 U/L (ref 0–44)
AST: 16 U/L (ref 15–41)
Albumin: 4.2 g/dL (ref 3.5–5.0)
Alkaline Phosphatase: 95 U/L (ref 38–126)
Anion gap: 9 (ref 5–15)
BUN: 15 mg/dL (ref 8–23)
CO2: 27 mmol/L (ref 22–32)
Calcium: 9.3 mg/dL (ref 8.9–10.3)
Chloride: 96 mmol/L — ABNORMAL LOW (ref 98–111)
Creatinine: 0.88 mg/dL (ref 0.44–1.00)
GFR, Est AFR Am: >60 mL/min (ref >60)
GFR, Estimated: >60 mL/min (ref >60)
Glucose, Bld: 110 mg/dL — ABNORMAL HIGH (ref 70–99)
Potassium: 3.9 mmol/L (ref 3.5–5.1)
Sodium: 132 mmol/L — ABNORMAL LOW (ref 135–145)
Total Bilirubin: 0.4 mg/dL (ref 0.3–1.2)
Total Protein: 6.5 g/dL (ref 6.5–8.1)

## 2018-06-13 LAB — CBC WITH DIFFERENTIAL (CANCER CENTER ONLY)
Abs Immature Granulocytes: 0.01 10*3/uL (ref 0.00–0.07)
Basophils Absolute: 0 10*3/uL (ref 0.0–0.1)
Basophils Relative: 0 %
Eosinophils Absolute: 0.1 10*3/uL (ref 0.0–0.5)
Eosinophils Relative: 1 %
HCT: 38 % (ref 36.0–46.0)
Hemoglobin: 11.9 g/dL — ABNORMAL LOW (ref 12.0–15.0)
Immature Granulocytes: 0 %
Lymphocytes Relative: 27 %
Lymphs Abs: 1.9 10*3/uL (ref 0.7–4.0)
MCH: 26.6 pg (ref 26.0–34.0)
MCHC: 31.3 g/dL (ref 30.0–36.0)
MCV: 84.8 fL (ref 80.0–100.0)
Monocytes Absolute: 0.9 10*3/uL (ref 0.1–1.0)
Monocytes Relative: 12 %
Neutro Abs: 4.2 10*3/uL (ref 1.7–7.7)
Neutrophils Relative %: 60 %
Platelet Count: 304 10*3/uL (ref 150–400)
RBC: 4.48 MIL/uL (ref 3.87–5.11)
RDW: 13.5 % (ref 11.5–15.5)
WBC Count: 7.1 10*3/uL (ref 4.0–10.5)
nRBC: 0 % (ref 0.0–0.2)

## 2018-06-13 NOTE — Progress Notes (Signed)
Hematology and Oncology Follow Up Visit  [name] [id number] 24-Jun-1950 68 y.o. 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  She is still feeling quite tired.  I suspect that is because of her iron being low.  Her weight also is down little bit more.  She says he is eating quite well.  Unfortunately, she had an accident back in March.  She fell and fractured her right ulna and radius and dislocated her elbow.  She needed surgery for this.  She is doing better.  She cannot totally straighten her right arm out.  I suspect she probably has some bleeding from this surgical procedure.  I told her that probably some oral iron would not be a bad idea in her situation.  I do still think that she would rapidly accumulate blood and would get too high with her hemoglobin.  She will take some over-the-counter iron.  I think this is reasonable.   As always, she is still smoking.  I do still think she will ever stop.  Overall, her performance status is ECOG 1.   Medications:  Allergies as of 06/13/2018      Reactions   Diphenhydramine Hcl Hives   Other reaction(s): Hives   Diphenhydramine Hives   Sulfa Antibiotics Nausea And Vomiting   Bactrim [sulfamethoxazole-trimethoprim] Nausea And Vomiting   Sulfamethoxazole-trimethoprim Nausea And Vomiting   Other reaction(s): Nausea And Vomiting      Medication List       Accurate as of June 13, 2018  3:49 PM. Always use your most recent med list.        acetaminophen 325 MG tablet Commonly known as:  TYLENOL Take 325-650 mg by mouth every 6 (six) hours as needed for mild pain or headache.   ProAir HFA 108 (90 Base) MCG/ACT inhaler Generic  drug:  albuterol Inhale 2 puffs into the lungs every 6 (six) hours as needed for wheezing or shortness of breath.   albuterol 108 (90 Base) MCG/ACT inhaler Commonly known as:  VENTOLIN HFA Inhale 2 puffs into the lungs every 6 (six) hours as needed for wheezing or shortness of breath.   ALPRAZolam 0.5 MG tablet Commonly known as:  XANAX Take 0.5 mg by mouth at bedtime.   amLODipine 5 MG tablet Commonly known as:  NORVASC Take 2 tablets (10 mg total) by mouth daily.   Biotin 1000 MCG tablet Take 1,000 mcg by mouth daily.   Breo Ellipta 100-25 MCG/INH Aepb Generic drug:  fluticasone furoate-vilanterol INHALE ONE PUFF ONCE A DAY   Chantix Starting Month Pak 0.5 MG X 11 & 1 MG X 42 tablet Generic drug:  varenicline See admin instructions.   clopidogrel 75 MG tablet Commonly known as:  PLAVIX Take 1 tablet (75 mg total) by mouth daily.   clotrimazole-betamethasone cream Commonly known as:  LOTRISONE Apply 1 application topically 2 (two) times daily as needed (to affected area).   escitalopram 10 MG tablet Commonly known as:  LEXAPRO Take 10 mg by mouth daily.   hydrochlorothiazide 12.5 MG capsule Commonly known as:  MICROZIDE Take 1 capsule (12.5 mg total) by mouth daily.   HYDROcodone-acetaminophen 5-325 MG tablet Commonly  known as:  NORCO/VICODIN   HYOSCYAMINE PO Take by mouth.   lansoprazole 30 MG capsule Commonly known as:  PREVACID Take 30 mg by mouth daily as needed (for ulcer/acid reflex). Ulcer/ acid reflux   metoprolol succinate 100 MG 24 hr tablet Commonly known as:  TOPROL-XL Take 1  & 1/2 (one & one-half) tablets by mouth daily.   rosuvastatin 10 MG tablet Commonly known as:  CRESTOR TAKE 1 TABLET BY MOUTH ONCE DAILY FOR 30 DAYS   telmisartan 20 MG tablet Commonly known as:  MICARDIS Take 1 tablet (20 mg total) by mouth daily.   vitamin C 500 MG tablet Commonly known as:  ASCORBIC ACID Take 500-1,000 mg by mouth daily.   VITAMIN C PO Take 2  tablets by mouth daily.   vitamin E 400 UNIT capsule Generic drug:  vitamin E Take 400 Units by mouth daily.       Allergies:  Allergies  Allergen Reactions  . Diphenhydramine Hcl Hives    Other reaction(s): Hives  . Diphenhydramine Hives  . Sulfa Antibiotics Nausea And Vomiting  . Bactrim [Sulfamethoxazole-Trimethoprim] Nausea And Vomiting  . Sulfamethoxazole-Trimethoprim Nausea And Vomiting    Other reaction(s): Nausea And Vomiting    Past Medical History, Surgical history, Social history, and Family History were reviewed and updated.  Review of Systems: Review of Systems  Constitutional: Positive for malaise/fatigue.  HENT: Negative.   Eyes: Negative.   Respiratory: Negative.   Cardiovascular: Negative.   Gastrointestinal: Positive for abdominal pain, diarrhea and nausea.  Genitourinary: Negative.   Musculoskeletal: Negative.   Skin: Negative.   Neurological: Negative.   Endo/Heme/Allergies: Negative.   Psychiatric/Behavioral: Negative.      Physical Exam:  height is 5' (1.524 m) and weight is 113 lb (51.3 kg). Her blood pressure is 156/59 (abnormal) and her pulse is 59 (abnormal). Her respiration is 17.   Wt Readings from Last 3 Encounters:  06/13/18 113 lb (51.3 kg)  04/26/18 115 lb (52.2 kg)  04/17/18 118 lb 8 oz (53.8 kg)    Physical Exam Vitals signs reviewed.  HENT:     Head: Normocephalic and atraumatic.  Eyes:     Pupils: Pupils are equal, round, and reactive to light.  Neck:     Musculoskeletal: Normal range of motion.  Cardiovascular:     Rate and Rhythm: Normal rate and regular rhythm.     Heart sounds: Normal heart sounds.  Pulmonary:     Effort: Pulmonary effort is normal.     Breath sounds: Normal breath sounds.  Abdominal:     General: Bowel sounds are normal.     Palpations: Abdomen is soft.  Musculoskeletal: Normal range of motion.        General: No tenderness or deformity.  Lymphadenopathy:     Cervical: No cervical  adenopathy.  Skin:    General: Skin is warm and dry.     Findings: No erythema or rash.  Neurological:     Mental Status: She is alert and oriented to person, place, and time.  Psychiatric:        Behavior: Behavior normal.        Thought Content: Thought content normal.        Judgment: Judgment normal.     Lab Results  Component Value Date   WBC 7.1 06/13/2018   HGB 11.9 (L) 06/13/2018   HCT 38.0 06/13/2018   MCV 84.8 06/13/2018   PLT 304 06/13/2018   Lab Results  Component Value Date  FERRITIN 16 04/17/2018   IRON 35 (L) 04/17/2018   TIBC 491 (H) 04/17/2018   UIBC 457 (H) 04/17/2018   IRONPCTSAT 7 (L) 04/17/2018   Lab Results  Component Value Date   RETICCTPCT 1.2 08/03/2010   RBC 4.48 06/13/2018   RETICCTABS 61.3 08/03/2010   No results found for: KPAFRELGTCHN, LAMBDASER, KAPLAMBRATIO No results found for: Kandis Cocking, IGMSERUM No results found for: Kathrynn Ducking, MSPIKE, SPEI   Chemistry      Component Value Date/Time   NA 132 (L) 06/13/2018 1439   NA 131 (L) 10/30/2017 1005   NA 139 01/03/2017 1327   NA 136 12/28/2015 1311   K 3.9 06/13/2018 1439   K 3.7 01/03/2017 1327   K 4.9 12/28/2015 1311   CL 96 (L) 06/13/2018 1439   CL 94 (L) 01/03/2017 1327   CO2 27 06/13/2018 1439   CO2 31 01/03/2017 1327   CO2 28 12/28/2015 1311   BUN 15 06/13/2018 1439   BUN 11 10/30/2017 1005   BUN 12 01/03/2017 1327   BUN 10.4 12/28/2015 1311   CREATININE 0.88 06/13/2018 1439   CREATININE 1.0 01/03/2017 1327   CREATININE 0.9 12/28/2015 1311      Component Value Date/Time   CALCIUM 9.3 06/13/2018 1439   CALCIUM 8.8 01/03/2017 1327   CALCIUM 9.6 12/28/2015 1311   ALKPHOS 95 06/13/2018 1439   ALKPHOS 85 (H) 01/03/2017 1327   ALKPHOS 83 12/28/2015 1311   AST 16 06/13/2018 1439   AST 25 12/28/2015 1311   ALT 9 06/13/2018 1439   ALT 19 01/03/2017 1327   ALT 13 12/28/2015 1311   BILITOT 0.4 06/13/2018 1439   BILITOT  0.74 12/28/2015 1311     Impression and Plan: Ms. Andreason is a 68 yo white female with polycythemia.   I want to see her back in 3 weeks.  I think this is reasonable for follow-up if she is got be on some iron pills.  She deftly does not need to be phlebotomized right now.    [name], MD 4/23/20203:49 PM

## 2018-06-14 ENCOUNTER — Telehealth: Payer: Self-pay | Admitting: Hematology & Oncology

## 2018-06-14 LAB — IRON AND TIBC
Iron: 35 ug/dL — ABNORMAL LOW (ref 41–142)
Saturation Ratios: 8 % — ABNORMAL LOW (ref 21–57)
TIBC: 463 ug/dL — ABNORMAL HIGH (ref 236–444)
UIBC: 428 ug/dL — ABNORMAL HIGH (ref 120–384)

## 2018-06-14 LAB — FERRITIN: Ferritin: 14 ng/mL (ref 11–307)

## 2018-06-14 NOTE — Telephone Encounter (Signed)
Unable to lvm to inform pt of 5/14 appt at 2 pm per 4/23 los. voicemail full

## 2018-06-25 ENCOUNTER — Telehealth: Payer: Self-pay | Admitting: Family

## 2018-06-25 DIAGNOSIS — S52271D Monteggia's fracture of right ulna, subsequent encounter for closed fracture with routine healing: Secondary | ICD-10-CM | POA: Diagnosis not present

## 2018-06-25 NOTE — Telephone Encounter (Signed)
Called and Spoke with patient appts for 5/14 r/s provider out of office/ patient is ok with new date/time

## 2018-06-27 ENCOUNTER — Telehealth: Payer: Self-pay | Admitting: Interventional Cardiology

## 2018-06-27 NOTE — Telephone Encounter (Signed)
LEFT MESSAGE FOR PT TO CALL ,\ME  BACK, NEED TO CHANGE DATE  OF APPT, GET CONSENT TO SWITCH TO VIRTUAL VISIT AND SEND CONSENT THROUGH [facility]

## 2018-07-01 NOTE — Telephone Encounter (Signed)
LMOM TO CALL BACK - IF PATEINT CALS BACK PLEASE PUT TO MY EXTENSION AT OFFICE [phone]

## 2018-07-01 NOTE — Progress Notes (Signed)
Virtual Visit via Video Note   This visit type was conducted due to national recommendations for restrictions regarding the COVID-19 Pandemic (e.g. social distancing) in an effort to limit this patient's exposure and mitigate transmission in our community.  Due to her co-morbid illnesses, this patient is at least at moderate risk for complications without adequate follow up.  This format is felt to be most appropriate for this patient at this time.  All issues noted in this document were discussed and addressed.  A limited physical exam was performed with this format.  Please refer to the patient's chart for her consent to telehealth for [facility].   Date:  07/01/2018   ID:  [name], DOB [birth year], MRN [id number]  Patient Location: Home Provider Location: Office  PCP:  Patient, No Pcp Per  Cardiologist:  Sinclair Grooms, MD  Electrophysiologist:  None   Evaluation Performed:  Follow-Up Visit  Chief Complaint:  CVD, Htn, CAD  History of Present Illness:    Yacine Droz is a 68 y.o. female with  hypertension, CADs/p stent 2010, left carotid endarterectomy 07/2016, right carotid endarterectomy 2007,polycythemia vera, tobacco abuse,COPD, GERD and anxiety. Polycythemia vera and hepatitis C followed by Dr. Marin Olp, hematologywith intermittent phlebotomy.  Very frustrating encounter with Vanetta Shawl.  As on her previous visit, she continues to be noncompliant with antihypertensive therapy.  "I cannot remember to take it because some so busy caring for my 29 year old mother".  She has no symptoms and seems to have very little understanding of her underlying disease process and the importance of risk factor modification.  We have had long conversations concerning this in the past.  Diet is uncontrolled relative to salt intake.  No physical activity.  The patient does not have symptoms concerning for COVID-19 infection (fever, chills, cough, or new  shortness of breath).    Past Medical History:  Diagnosis Date  . Anxiety   . Arthritis   . CAD (coronary artery disease)   . COPD (chronic obstructive pulmonary disease) ([place])   . Depression   . Dyspnea   . GERD (gastroesophageal reflux disease)   . HCV (hepatitis C virus)   . Hepatitis C   . HTN (hypertension)   . Hypertension   . Peripheral vascular disease ([place])   . Polycythemia   . Polycythemia 2009  . PVD (peripheral vascular disease) ([place])   . Seizures ([place]) 1970   x1- pt. reports that there was never any causative agent     Past Surgical History:  Procedure Laterality Date  . 2 stints  Aug 26,2010   Dr. Tawnya Crook  . ABDOMINAL SURGERY  1970's   for excessive bleeding from loss of pregnancy, required blood transfusion post procedure   . CAROTID ENDARTERECTOMY  2007  . cartoid endartherectomy  2007  . CORONARY ANGIOPLASTY WITH STENT PLACEMENT  2010  . CORONARY ANGIOPLASTY WITH STENT PLACEMENT  10/09/2008  . ENDARTERECTOMY Left 07/24/2016   Procedure: ENDARTERECTOMY CAROTID;  Surgeon: [name], MD;  Location: [facility];  Service: Vascular;  Laterality: Left;  . FLEXIBLE SIGMOIDOSCOPY N/A 03/22/2017   Procedure: FLEXIBLE SIGMOIDOSCOPY;  Surgeon: Carol Ada, MD;  Location: [facility];  Service: Endoscopy;  Laterality: N/A;  . LYMPH NODE DISSECTION Left 1990's   benign   . ORIF ELBOW FRACTURE Right 04/26/2018   Procedure: OPEN REDUCTION INTERNAL FIXATION (ORIF) ELBOW/OLECRANON FRACTURE;  Surgeon: Shona Needles, MD;  Location: [facility];  Service: Orthopedics;  Laterality: Right;  . TUBAL  LIGATION       No outpatient medications have been marked as taking for the 07/03/18 encounter (Appointment) with Belva Crome, MD.     Allergies:   Diphenhydramine hcl; Diphenhydramine; Sulfa antibiotics; Bactrim [sulfamethoxazole-trimethoprim]; and Sulfamethoxazole-trimethoprim   Social History   Tobacco Use  . Smoking status: Current Every Day Smoker    Packs/day: 0.25    Years:  48.00    Pack years: 12.00    Types: Cigarettes    Start date: 03/20/1968  . Smokeless tobacco: Never Used  . Tobacco comment: "1 pk lasts about 3 days", uses VAPE, uses nicorette patch   Substance Use Topics  . Alcohol use: Yes    Alcohol/week: 0.0 standard drinks    Comment: socially occasionally  . Drug use: No    Comment: has smoked cigarettes off and on     Family Hx: The patient's family history includes Heart disease in her father and mother.  ROS:   Please see the history of present illness.    No particular complaints other than stress related to the pandemic and having 24/7 responsibility for her 21 year old mother who lives with her. All other systems reviewed and are negative.   Prior CV studies:   The following studies were reviewed today:  No new data  Labs/Other Tests and Data Reviewed:    EKG:  No ECG reviewed.  Recent Labs: 06/13/2018: ALT 9; BUN 15; Creatinine 0.88; Hemoglobin 11.9; Platelet Count 304; Potassium 3.9; Sodium 132   Recent Lipid Panel Lab Results  Component Value Date/Time   CHOL 142 12/04/2017 12:03 PM   TRIG 95 12/04/2017 12:03 PM   HDL 62 12/04/2017 12:03 PM   CHOLHDL 2.3 12/04/2017 12:03 PM   CHOLHDL 5.1 (H) 09/16/2014 01:22 PM   LDLCALC 61 12/04/2017 12:03 PM    Wt Readings from Last 3 Encounters:  06/13/18 113 lb (51.3 kg)  04/26/18 115 lb (52.2 kg)  04/17/18 118 lb 8 oz (53.8 kg)     Objective:    Vital Signs:  There were no vitals taken for this visit.   VITAL SIGNS:  reviewed GEN:  no acute distress  ASSESSMENT & PLAN:    1. Coronary artery disease involving native coronary artery of native heart with angina pectoris ([place])   2. Essential hypertension   3. Hyperlipidemia LDL goal <70   4. Tobacco abuse   5. COPD exacerbation ([place])   6. 2019 novel coronavirus disease (COVID-19)    PLAN:  1. Asymptomatic.  Secondary prevention discussed.  She does not comprehend the importance of prevention.  She also has  carotid disease. 2. Blood pressure target 130/80 mmHg.  The regimen should include amlodipine, telmisartan, HCTZ, and metoprolol. 3. LDL target should be less than 70. 4. Smoking cessation discussed.  She is unable to commit. 5. No specific comments concerning COPD.  Overall education and awareness concerning primary/secondary risk prevention was discussed in detail: LDL less than 70, hemoglobin A1c less than 7, blood pressure target less than 130/80 mmHg, >150 minutes of moderate aerobic activity per week, avoidance of smoking, weight control (via diet and exercise), and continued surveillance/management of/for obstructive sleep apnea.  Greater than 50% of the time during this office visit was spent in education, counseling, and coordination of care related to underlying disease process and testing as outlined.    COVID-19 Education: The signs and symptoms of COVID-19 were discussed with the patient and how to seek care for testing (follow up with PCP or arrange E-visit).  The importance of social distancing was discussed today.  Time:   Today, I have spent 18 minutes with the patient with telehealth technology discussing the above problems.     Medication Adjustments/Labs and Tests Ordered: Current medicines are reviewed at length with the patient today.  Concerns regarding medicines are outlined above.   Tests Ordered: No orders of the defined types were placed in this encounter.   Medication Changes: No orders of the defined types were placed in this encounter.   Disposition:  Follow up in 6 month(s)  Signed, Sinclair Grooms, MD  07/01/2018 7:47 PM    [place]

## 2018-07-02 NOTE — Telephone Encounter (Signed)
Left message to call back  

## 2018-07-02 NOTE — Telephone Encounter (Signed)
Spoke with pt and she is agreeable to virtual visit using Doximity.  Went over consent.  Pt verbalized understanding.     Virtual Visit Pre-Appointment Phone Call  "(Name), I am calling you today to discuss your upcoming appointment. We are currently trying to limit exposure to the virus that causes COVID-19 by seeing patients at home rather than in the office."  1. "What is the BEST phone number to call the day of the visit?" - include this in appointment notes  2. "Do you have or have access to (through a family member/friend) a smartphone with video capability that we can use for your visit?" a. If yes - list this number in appt notes as "cell" (if different from BEST phone #) and list the appointment type as a VIDEO visit in appointment notes b. If no - list the appointment type as a PHONE visit in appointment notes  3. Confirm consent - "In the setting of the current Covid19 crisis, you are scheduled for a (phone or video) visit with your provider on (date) at (time).  Just as we do with many in-office visits, in order for you to participate in this visit, we must obtain consent.  If you'd like, I can send this to your mychart (if signed up) or email for you to review.  Otherwise, I can obtain your verbal consent now.  All virtual visits are billed to your insurance company just like a normal visit would be.  By agreeing to a virtual visit, we'd like you to understand that the technology does not allow for your provider to perform an examination, and thus may limit your provider's ability to fully assess your condition. If your provider identifies any concerns that need to be evaluated in person, we will make arrangements to do so.  Finally, though the technology is pretty good, we cannot assure that it will always work on either your or our end, and in the setting of a video visit, we may have to convert it to a phone-only visit.  In either situation, we cannot ensure that we have a secure  connection.  Are you willing to proceed?" STAFF: Did the patient verbally acknowledge consent to telehealth visit? Document YES/NO here: yes  4. Advise patient to be prepared - "Two hours prior to your appointment, go ahead and check your blood pressure, pulse, oxygen saturation, and your weight (if you have the equipment to check those) and write them all down. When your visit starts, your provider will ask you for this information. If you have an Apple Watch or Kardia device, please plan to have heart rate information ready on the day of your appointment. Please have a pen and paper handy nearby the day of the visit as well."  5. Give patient instructions for MyChart download to smartphone OR Doximity/Doxy.me as below if video visit (depending on what platform provider is using)  6. Inform patient they will receive a phone call 15 minutes prior to their appointment time (may be from unknown caller ID) so they should be prepared to answer    TELEPHONE CALL NOTE  Tajha Sammarco has been deemed a candidate for a follow-up tele-health visit to limit community exposure during the Covid-19 pandemic. I spoke with the patient via phone to ensure availability of phone/video source, confirm preferred email & phone number, and discuss instructions and expectations.  I reminded [place] Peifer Vizzini to be prepared with any vital sign and/or heart rhythm information that could potentially be  obtained via home monitoring, at the time of her visit. I reminded Special Ranes to expect a phone call prior to her visit.  Keshonna Valvo L, LPN 08/17/3149 7:61 PM   INSTRUCTIONS FOR DOWNLOADING THE MYCHART APP TO SMARTPHONE  - The patient must first make sure to have activated MyChart and know their login information - If Apple, go to [employer] and type in MyChart in the search bar and download the app. If Android, ask patient to go to [employer] and type in [place] in the search  bar and download the app. The app is free but as with any other app downloads, their phone may require them to verify saved payment information or Apple/Android password.  - The patient will need to then log into the app with their MyChart username and password, and select [facility] as their healthcare provider to link the account. When it is time for your visit, go to the MyChart app, find appointments, and click Begin Video Visit. Be sure to Select Allow for your device to access the Microphone and Camera for your visit. You will then be connected, and your provider will be with you shortly.  **If they have any issues connecting, or need assistance please contact MyChart service desk [phone])**  **If using a computer, in order to ensure the best quality for their visit they will need to use either of the following Internet Browsers: [employer], or Google Chrome**  IF USING DOXIMITY or DOXY.ME - The patient will receive a link just prior to their visit by text.     FULL LENGTH CONSENT FOR TELE-HEALTH VISIT   I hereby voluntarily request, consent and authorize [facility] and its employed or contracted physicians, physician assistants, nurse practitioners or other licensed health care professionals (the Practitioner), to provide me with telemedicine health care services (the "Services") as deemed necessary by the treating Practitioner. I acknowledge and consent to receive the Services by the Practitioner via telemedicine. I understand that the telemedicine visit will involve communicating with the Practitioner through live audiovisual communication technology and the disclosure of certain medical information by electronic transmission. I acknowledge that I have been given the opportunity to request an in-person assessment or other available alternative prior to the telemedicine visit and am voluntarily participating in the telemedicine visit.  I understand that I have the  right to withhold or withdraw my consent to the use of telemedicine in the course of my care at any time, without affecting my right to future care or treatment, and that the Practitioner or I may terminate the telemedicine visit at any time. I understand that I have the right to inspect all information obtained and/or recorded in the course of the telemedicine visit and may receive copies of available information for a reasonable fee.  I understand that some of the potential risks of receiving the Services via telemedicine include:  Marland Kitchen Delay or interruption in medical evaluation due to technological equipment failure or disruption; . Information transmitted may not be sufficient (e.g. poor resolution of images) to allow for appropriate medical decision making by the Practitioner; and/or  . In rare instances, security protocols could fail, causing a breach of personal health information.  Furthermore, I acknowledge that it is my responsibility to provide information about my medical history, conditions and care that is complete and accurate to the best of my ability. I acknowledge that Practitioner's advice, recommendations, and/or decision may be based on factors not within their control, such  as incomplete or inaccurate data provided by me or distortions of diagnostic images or specimens that may result from electronic transmissions. I understand that the practice of medicine is not an exact science and that Practitioner makes no warranties or guarantees regarding treatment outcomes. I acknowledge that I will receive a copy of this consent concurrently upon execution via email to the email address I last provided but may also request a printed copy by calling the office of [facility].    I understand that my insurance will be billed for this visit.   I have read or had this consent read to me. . I understand the contents of this consent, which adequately explains the benefits and risks of the Services  being provided via telemedicine.  . I have been provided ample opportunity to ask questions regarding this consent and the Services and have had my questions answered to my satisfaction. . I give my informed consent for the services to be provided through the use of telemedicine in my medical care  By participating in this telemedicine visit I agree to the above.

## 2018-07-03 ENCOUNTER — Other Ambulatory Visit: Payer: Self-pay

## 2018-07-03 ENCOUNTER — Encounter: Payer: Self-pay | Admitting: Interventional Cardiology

## 2018-07-03 ENCOUNTER — Telehealth (INDEPENDENT_AMBULATORY_CARE_PROVIDER_SITE_OTHER): Payer: PPO | Admitting: Interventional Cardiology

## 2018-07-03 VITALS — BP 195/86 | HR 67 | Ht 60.0 in | Wt 114.6 lb

## 2018-07-03 DIAGNOSIS — J441 Chronic obstructive pulmonary disease with (acute) exacerbation: Secondary | ICD-10-CM

## 2018-07-03 DIAGNOSIS — I25119 Atherosclerotic heart disease of native coronary artery with unspecified angina pectoris: Secondary | ICD-10-CM | POA: Diagnosis not present

## 2018-07-03 DIAGNOSIS — Z720 Tobacco use: Secondary | ICD-10-CM

## 2018-07-03 DIAGNOSIS — E785 Hyperlipidemia, unspecified: Secondary | ICD-10-CM

## 2018-07-03 DIAGNOSIS — Z7189 Other specified counseling: Secondary | ICD-10-CM

## 2018-07-03 DIAGNOSIS — I10 Essential (primary) hypertension: Secondary | ICD-10-CM

## 2018-07-03 NOTE — Patient Instructions (Signed)
Medication Instructions:  1) Make sure you are taking your Telmisartan and Amlodipine every morning.  Monitor your blood pressure every morning, 2-4 hours after your medication and contact our office in a week with those readings.    If you need a refill on your cardiac medications before your next appointment, please call your pharmacy.   Lab work: None If you have labs (blood work) drawn today and your tests are completely normal, you will receive your results only by: Marland Kitchen MyChart Message (if you have MyChart) OR . A paper copy in the mail If you have any lab test that is abnormal or we need to change your treatment, we will call you to review the results.  Testing/Procedures: None  Follow-Up: Your physician recommends that you schedule a follow-up appointment in: 3 months with the blood pressure clinic.    Any Other Special Instructions Will Be Listed Below (If Applicable).

## 2018-07-04 ENCOUNTER — Other Ambulatory Visit: Payer: PPO

## 2018-07-04 ENCOUNTER — Ambulatory Visit: Payer: PPO | Admitting: Family

## 2018-07-05 ENCOUNTER — Other Ambulatory Visit: Payer: Self-pay | Admitting: Interventional Cardiology

## 2018-07-08 ENCOUNTER — Inpatient Hospital Stay: Payer: PPO | Admitting: Family

## 2018-07-08 ENCOUNTER — Inpatient Hospital Stay: Payer: PPO

## 2018-07-22 ENCOUNTER — Inpatient Hospital Stay: Payer: PPO | Attending: Hematology & Oncology

## 2018-07-22 ENCOUNTER — Other Ambulatory Visit: Payer: Self-pay

## 2018-07-22 ENCOUNTER — Encounter: Payer: Self-pay | Admitting: Family

## 2018-07-22 ENCOUNTER — Telehealth: Payer: Self-pay | Admitting: Interventional Cardiology

## 2018-07-22 ENCOUNTER — Inpatient Hospital Stay (HOSPITAL_BASED_OUTPATIENT_CLINIC_OR_DEPARTMENT_OTHER): Payer: PPO | Admitting: Family

## 2018-07-22 VITALS — BP 148/88 | HR 62 | Temp 97.6°F | Resp 18 | Wt 112.0 lb

## 2018-07-22 DIAGNOSIS — D45 Polycythemia vera: Secondary | ICD-10-CM

## 2018-07-22 DIAGNOSIS — B1710 Acute hepatitis C without hepatic coma: Secondary | ICD-10-CM

## 2018-07-22 DIAGNOSIS — D751 Secondary polycythemia: Secondary | ICD-10-CM

## 2018-07-22 LAB — CBC WITH DIFFERENTIAL (CANCER CENTER ONLY)
Abs Immature Granulocytes: 0.02 10*3/uL (ref 0.00–0.07)
Basophils Absolute: 0 10*3/uL (ref 0.0–0.1)
Basophils Relative: 0 %
Eosinophils Absolute: 0.1 10*3/uL (ref 0.0–0.5)
Eosinophils Relative: 1 %
HCT: 41.5 % (ref 36.0–46.0)
Hemoglobin: 13 g/dL (ref 12.0–15.0)
Immature Granulocytes: 0 %
Lymphocytes Relative: 36 %
Lymphs Abs: 2.5 10*3/uL (ref 0.7–4.0)
MCH: 26.5 pg (ref 26.0–34.0)
MCHC: 31.3 g/dL (ref 30.0–36.0)
MCV: 84.7 fL (ref 80.0–100.0)
Monocytes Absolute: 0.7 10*3/uL (ref 0.1–1.0)
Monocytes Relative: 10 %
Neutro Abs: 3.7 10*3/uL (ref 1.7–7.7)
Neutrophils Relative %: 53 %
Platelet Count: 294 10*3/uL (ref 150–400)
RBC: 4.9 MIL/uL (ref 3.87–5.11)
RDW: 15.3 % (ref 11.5–15.5)
WBC Count: 7.1 10*3/uL (ref 4.0–10.5)
nRBC: 0 % (ref 0.0–0.2)

## 2018-07-22 LAB — CMP (CANCER CENTER ONLY)
ALT: 10 U/L (ref 0–44)
AST: 20 U/L (ref 15–41)
Albumin: 4.4 g/dL (ref 3.5–5.0)
Alkaline Phosphatase: 87 U/L (ref 38–126)
Anion gap: 10 (ref 5–15)
BUN: 8 mg/dL (ref 8–23)
CO2: 31 mmol/L (ref 22–32)
Calcium: 9.5 mg/dL (ref 8.9–10.3)
Chloride: 97 mmol/L — ABNORMAL LOW (ref 98–111)
Creatinine: 0.73 mg/dL (ref 0.44–1.00)
GFR, Est AFR Am: >60 mL/min (ref >60)
GFR, Estimated: >60 mL/min (ref >60)
Glucose, Bld: 130 mg/dL — ABNORMAL HIGH (ref 70–99)
Potassium: 4.4 mmol/L (ref 3.5–5.1)
Sodium: 138 mmol/L (ref 135–145)
Total Bilirubin: 0.4 mg/dL (ref 0.3–1.2)
Total Protein: 7.5 g/dL (ref 6.5–8.1)

## 2018-07-22 NOTE — Telephone Encounter (Signed)
Called and left message for pt to call back.  Called to check on her BP and to see if she is taking medications.  Advised on message to call back with readings.

## 2018-07-23 ENCOUNTER — Telehealth: Payer: Self-pay | Admitting: Family

## 2018-07-23 LAB — IRON AND TIBC
Iron: 29 ug/dL — ABNORMAL LOW (ref 41–142)
Saturation Ratios: 6 % — ABNORMAL LOW (ref 21–57)
TIBC: 520 ug/dL — ABNORMAL HIGH (ref 236–444)
UIBC: 491 ug/dL — ABNORMAL HIGH (ref 120–384)

## 2018-07-23 LAB — FERRITIN: Ferritin: 9 ng/mL — ABNORMAL LOW (ref 11–307)

## 2018-07-23 NOTE — Progress Notes (Signed)
Hematology and Oncology Follow Up Visit  [name] [id number] Nov 22, 1950 68 y.o. 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 is here today for follow-up. She is doing well but has had some joint aches and pains due to arthritis.  No episodes of bleeding. She does bruise easily on Plavix.  No fever, chills, n/v, cough, rash, dizziness, SOB, chest pain, palpitations, abdominal pain or changes in bowel or bladder habits.  No swelling, tenderness, numbness or tingling in her extremities.  No lymphadenopathy noted on exam.  She has maintained a good appetite and is staying well hydrated. Her weight is stable.   ECOG Performance Status: 0 - Asymptomatic  Medications:  Allergies as of 07/22/2018      Reactions   Diphenhydramine Hcl Hives   Other reaction(s): Hives   Diphenhydramine Hives   Sulfa Antibiotics Nausea And Vomiting   Bactrim [sulfamethoxazole-trimethoprim] Nausea And Vomiting   Sulfamethoxazole-trimethoprim Nausea And Vomiting   Other reaction(s): Nausea And Vomiting      Medication List       Accurate as of July 22, 2018 11:59 PM. If you have any questions, ask your nurse or doctor.        acetaminophen 325 MG tablet Commonly known as:  TYLENOL Take 325-650 mg by mouth every 6 (six) hours as needed for mild pain or headache.   ALPRAZolam 0.5 MG tablet Commonly known as:  XANAX Take 0.5 mg by mouth at bedtime.   amLODipine 10 MG tablet Commonly known as:  NORVASC Take 10 mg by mouth daily.   Biotin 1000 MCG tablet Take 1,000 mcg by mouth daily.   Breo Ellipta 100-25 MCG/INH Aepb Generic drug:  fluticasone furoate-vilanterol INHALE ONE PUFF ONCE A DAY   clopidogrel 75 MG tablet Commonly known  as:  PLAVIX Take 1 tablet (75 mg total) by mouth daily.   clotrimazole-betamethasone cream Commonly known as:  LOTRISONE Apply 1 application topically 2 (two) times daily as needed (to affected area).   escitalopram 10 MG tablet Commonly known as:  LEXAPRO Take 10 mg by mouth daily.   hydrochlorothiazide 12.5 MG capsule Commonly known as:  MICROZIDE Take 1 capsule by mouth once daily   lansoprazole 30 MG capsule Commonly known as:  PREVACID Take 30 mg by mouth daily as needed (for ulcer/acid reflex). Ulcer/ acid reflux   metoprolol succinate 100 MG 24 hr tablet Commonly known as:  TOPROL-XL Take 1  & 1/2 (one & one-half) tablets by mouth daily.   ProAir HFA 108 (90 Base) MCG/ACT inhaler Generic drug:  albuterol Inhale 2 puffs into the lungs every 6 (six) hours as needed for wheezing or shortness of breath.   rosuvastatin 10 MG tablet Commonly known as:  CRESTOR TAKE 1 TABLET BY MOUTH ONCE DAILY FOR 30 DAYS   telmisartan 20 MG tablet Commonly known as:  MICARDIS Take 20 mg by mouth daily.   vitamin C 500 MG tablet Commonly known as:  ASCORBIC ACID Take 500-1,000 mg by mouth daily.   VITAMIN C PO Take 2 tablets by mouth daily.   vitamin E 400 UNIT capsule Generic drug:  vitamin E Take 400 Units by mouth daily.       Allergies:  Allergies  Allergen Reactions  .  Diphenhydramine Hcl Hives    Other reaction(s): Hives  . Diphenhydramine Hives  . Sulfa Antibiotics Nausea And Vomiting  . Bactrim [Sulfamethoxazole-Trimethoprim] Nausea And Vomiting  . Sulfamethoxazole-Trimethoprim Nausea And Vomiting    Other reaction(s): Nausea And Vomiting    Past Medical History, Surgical history, Social history, and Family History were reviewed and updated.  Review of Systems: All other 10 point review of systems is negative.   Physical Exam:  weight is 112 lb (50.8 kg). Her oral temperature is 97.6 F (36.4 C). Her blood pressure is 148/88 (abnormal) and her pulse is 62.  Her respiration is 18 and oxygen saturation is 100%.   Wt Readings from Last 3 Encounters:  07/22/18 112 lb (50.8 kg)  07/03/18 114 lb 9.6 oz (52 kg)  06/13/18 113 lb (51.3 kg)    Ocular: Sclerae unicteric, pupils equal, round and reactive to light Ear-nose-throat: Oropharynx clear, dentition fair Lymphatic: No cervical or supraclavicular adenopathy Lungs no rales or rhonchi, good excursion bilaterally Heart regular rate and rhythm, no murmur appreciated Abd soft, nontender, positive bowel sounds, no liver or spleen tip palpated on exam, no fluid wave  MSK no focal spinal tenderness, no joint edema Neuro: non-focal, well-oriented, appropriate affect Breasts: Deferred   Lab Results  Component Value Date   WBC 7.1 07/22/2018   HGB 13.0 07/22/2018   HCT 41.5 07/22/2018   MCV 84.7 07/22/2018   PLT 294 07/22/2018   Lab Results  Component Value Date   FERRITIN 14 06/13/2018   IRON 35 (L) 06/13/2018   TIBC 463 (H) 06/13/2018   UIBC 428 (H) 06/13/2018   IRONPCTSAT 8 (L) 06/13/2018   Lab Results  Component Value Date   RETICCTPCT 1.2 08/03/2010   RBC 4.90 07/22/2018   RETICCTABS 61.3 08/03/2010   No results found for: KPAFRELGTCHN, LAMBDASER, KAPLAMBRATIO No results found for: Kandis Cocking, IGMSERUM No results found for: Odetta Pink, SPEI   Chemistry      Component Value Date/Time   NA 138 07/22/2018 1529   NA 131 (L) 10/30/2017 1005   NA 139 01/03/2017 1327   NA 136 12/28/2015 1311   K 4.4 07/22/2018 1529   K 3.7 01/03/2017 1327   K 4.9 12/28/2015 1311   CL 97 (L) 07/22/2018 1529   CL 94 (L) 01/03/2017 1327   CO2 31 07/22/2018 1529   CO2 31 01/03/2017 1327   CO2 28 12/28/2015 1311   BUN 8 07/22/2018 1529   BUN 11 10/30/2017 1005   BUN 12 01/03/2017 1327   BUN 10.4 12/28/2015 1311   CREATININE 0.73 07/22/2018 1529   CREATININE 1.0 01/03/2017 1327   CREATININE 0.9 12/28/2015 1311      Component Value  Date/Time   CALCIUM 9.5 07/22/2018 1529   CALCIUM 8.8 01/03/2017 1327   CALCIUM 9.6 12/28/2015 1311   ALKPHOS 87 07/22/2018 1529   ALKPHOS 85 (H) 01/03/2017 1327   ALKPHOS 83 12/28/2015 1311   AST 20 07/22/2018 1529   AST 25 12/28/2015 1311   ALT 10 07/22/2018 1529   ALT 19 01/03/2017 1327   ALT 13 12/28/2015 1311   BILITOT 0.4 07/22/2018 1529   BILITOT 0.74 12/28/2015 1311       Impression and Plan: Ms. Ignatowski is a very pleasant 68 yo caucasian female with polycythemia, 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  Her counts remain stable with Hct of 41.5%. No phlebotomy needed at this time.  We will plan to see her back in another 6  weeks.  She will contact our office with any questions or concerns. We can certainly see her sooner if need be.    Laverna Peace, NP 6/2/20209:16 AM

## 2018-07-23 NOTE — Telephone Encounter (Signed)
Called and LMVM for patient with date.time of appointments per 6/1 los

## 2018-08-06 DIAGNOSIS — S52271D Monteggia's fracture of right ulna, subsequent encounter for closed fracture with routine healing: Secondary | ICD-10-CM | POA: Diagnosis not present

## 2018-08-06 NOTE — Telephone Encounter (Signed)
Left message to call back  

## 2018-08-19 NOTE — Telephone Encounter (Signed)
Left message to call back  

## 2018-09-02 ENCOUNTER — Inpatient Hospital Stay: Payer: PPO | Attending: Hematology & Oncology

## 2018-09-02 ENCOUNTER — Encounter: Payer: Self-pay | Admitting: Family

## 2018-09-02 ENCOUNTER — Other Ambulatory Visit: Payer: Self-pay

## 2018-09-02 ENCOUNTER — Inpatient Hospital Stay (HOSPITAL_BASED_OUTPATIENT_CLINIC_OR_DEPARTMENT_OTHER): Payer: PPO | Admitting: Family

## 2018-09-02 VITALS — BP 155/82 | HR 60 | Temp 98.4°F | Resp 18 | Ht 60.0 in | Wt 112.8 lb

## 2018-09-02 DIAGNOSIS — D500 Iron deficiency anemia secondary to blood loss (chronic): Secondary | ICD-10-CM | POA: Insufficient documentation

## 2018-09-02 DIAGNOSIS — Z720 Tobacco use: Secondary | ICD-10-CM | POA: Insufficient documentation

## 2018-09-02 DIAGNOSIS — D751 Secondary polycythemia: Secondary | ICD-10-CM | POA: Insufficient documentation

## 2018-09-02 DIAGNOSIS — R0609 Other forms of dyspnea: Secondary | ICD-10-CM | POA: Diagnosis not present

## 2018-09-02 DIAGNOSIS — B1710 Acute hepatitis C without hepatic coma: Secondary | ICD-10-CM

## 2018-09-02 LAB — CBC WITH DIFFERENTIAL (CANCER CENTER ONLY)
Abs Immature Granulocytes: 0.01 10*3/uL (ref 0.00–0.07)
Basophils Absolute: 0 10*3/uL (ref 0.0–0.1)
Basophils Relative: 1 %
Eosinophils Absolute: 0.1 10*3/uL (ref 0.0–0.5)
Eosinophils Relative: 2 %
HCT: 42.7 % (ref 36.0–46.0)
Hemoglobin: 13.4 g/dL (ref 12.0–15.0)
Immature Granulocytes: 0 %
Lymphocytes Relative: 32 %
Lymphs Abs: 2 10*3/uL (ref 0.7–4.0)
MCH: 26.7 pg (ref 26.0–34.0)
MCHC: 31.4 g/dL (ref 30.0–36.0)
MCV: 85.1 fL (ref 80.0–100.0)
Monocytes Absolute: 0.7 10*3/uL (ref 0.1–1.0)
Monocytes Relative: 11 %
Neutro Abs: 3.5 10*3/uL (ref 1.7–7.7)
Neutrophils Relative %: 54 %
Platelet Count: 274 10*3/uL (ref 150–400)
RBC: 5.02 MIL/uL (ref 3.87–5.11)
RDW: 16.8 % — ABNORMAL HIGH (ref 11.5–15.5)
WBC Count: 6.4 10*3/uL (ref 4.0–10.5)
nRBC: 0 % (ref 0.0–0.2)

## 2018-09-02 LAB — CMP (CANCER CENTER ONLY)
ALT: 10 U/L (ref 0–44)
AST: 19 U/L (ref 15–41)
Albumin: 4.2 g/dL (ref 3.5–5.0)
Alkaline Phosphatase: 72 U/L (ref 38–126)
Anion gap: 7 (ref 5–15)
BUN: 12 mg/dL (ref 8–23)
CO2: 33 mmol/L — ABNORMAL HIGH (ref 22–32)
Calcium: 9 mg/dL (ref 8.9–10.3)
Chloride: 95 mmol/L — ABNORMAL LOW (ref 98–111)
Creatinine: 0.71 mg/dL (ref 0.44–1.00)
GFR, Est AFR Am: >60 mL/min (ref >60)
GFR, Estimated: >60 mL/min (ref >60)
Glucose, Bld: 108 mg/dL — ABNORMAL HIGH (ref 70–99)
Potassium: 4.9 mmol/L (ref 3.5–5.1)
Sodium: 135 mmol/L (ref 135–145)
Total Bilirubin: 0.4 mg/dL (ref 0.3–1.2)
Total Protein: 6.8 g/dL (ref 6.5–8.1)

## 2018-09-02 NOTE — Progress Notes (Signed)
Hematology and Oncology Follow Up Visit  [name] [id number] 09/20/1950 68 y.o. 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 She is doing fairly well but has had some fatigue.  She does not sleep well at night and thinks she may have sleep apnea. She states that she needs to make an appointment for a sleep study.  Hct is stable at 42.7%.  No bleeding, bruising or petechiae.  She is still smoking and has SOB with over exertion. She will take a break to rest when needed.  No fever, chills, n/v, cough, rash, dizziness, chest pain, palpitations, abdominal pain or changes in bowel or bladder habits.  No swelling or tenderness in her extremities. She has tingling in her fingertips that comes and goes.  She has a good appetite but states that she needs to better hydrated at home. Her weight is stable.   ECOG Performance Status: 1 - Symptomatic but completely ambulatory  Medications:  Allergies as of 09/02/2018      Reactions   Diphenhydramine Hcl Hives   Other reaction(s): Hives   Diphenhydramine Hives   Sulfa Antibiotics Nausea And Vomiting   Bactrim [sulfamethoxazole-trimethoprim] Nausea And Vomiting   Sulfamethoxazole-trimethoprim Nausea And Vomiting   Other reaction(s): Nausea And Vomiting      Medication List       Accurate as of September 02, 2018  2:13 PM. If you have any questions, ask your nurse or doctor.        acetaminophen 325 MG tablet Commonly known as: TYLENOL Take 325-650 mg by mouth every 6 (six) hours as needed for mild pain or headache.   ALPRAZolam 0.5 MG tablet Commonly known as: XANAX Take 0.5 mg by mouth at bedtime.   amLODipine 10 MG tablet Commonly known as:  NORVASC Take 10 mg by mouth daily.   Biotin 1000 MCG tablet Take 1,000 mcg by mouth daily.   Breo Ellipta 100-25 MCG/INH Aepb Generic drug: fluticasone furoate-vilanterol INHALE ONE PUFF ONCE A DAY   clopidogrel 75 MG tablet Commonly known as: PLAVIX Take 1 tablet (75 mg total) by mouth daily.   clotrimazole-betamethasone cream Commonly known as: LOTRISONE Apply 1 application topically 2 (two) times daily as needed (to affected area).   escitalopram 10 MG tablet Commonly known as: LEXAPRO Take 10 mg by mouth daily.   hydrochlorothiazide 12.5 MG capsule Commonly known as: MICROZIDE Take 1 capsule by mouth once daily   lansoprazole 30 MG capsule Commonly known as: PREVACID Take 30 mg by mouth daily as needed (for ulcer/acid reflex). Ulcer/ acid reflux   metoprolol succinate 100 MG 24 hr tablet Commonly known as: TOPROL-XL Take 1  & 1/2 (one & one-half) tablets by mouth daily.   ProAir HFA 108 (90 Base) MCG/ACT inhaler Generic drug: albuterol Inhale 2 puffs into the lungs every 6 (six) hours as needed for wheezing or shortness of breath.   rosuvastatin 10 MG tablet Commonly known as: CRESTOR TAKE 1 TABLET BY MOUTH ONCE DAILY FOR 30 DAYS   telmisartan 20 MG tablet Commonly known as: MICARDIS Take 20 mg by mouth daily.   vitamin C 500 MG tablet Commonly known as: ASCORBIC ACID Take 500-1,000 mg by mouth daily.  VITAMIN C PO Take 2 tablets by mouth daily.   vitamin E 400 UNIT capsule Generic drug: vitamin E Take 400 Units by mouth daily.       Allergies:  Allergies  Allergen Reactions  . Diphenhydramine Hcl Hives    Other reaction(s): Hives  . Diphenhydramine Hives  . Sulfa Antibiotics Nausea And Vomiting  . Bactrim [Sulfamethoxazole-Trimethoprim] Nausea And Vomiting  . Sulfamethoxazole-Trimethoprim Nausea And Vomiting    Other reaction(s): Nausea And Vomiting    Past Medical History, Surgical history, Social history, and Family History were reviewed  and updated.  Review of Systems: All other 10 point review of systems is negative.   Physical Exam:  vitals were not taken for this visit.   Wt Readings from Last 3 Encounters:  07/22/18 112 lb (50.8 kg)  07/03/18 114 lb 9.6 oz (52 kg)  06/13/18 113 lb (51.3 kg)    Ocular: Sclerae unicteric, pupils equal, round and reactive to light Ear-nose-throat: Oropharynx clear, dentition fair Lymphatic: No cervical or supraclavicular adenopathy Lungs no rales or rhonchi, good excursion bilaterally Heart regular rate and rhythm, no murmur appreciated Abd soft, nontender, positive bowel sounds, no liver or spleen tip palpated on exam, no fluid wave  MSK no focal spinal tenderness, no joint edema Neuro: non-focal, well-oriented, appropriate affect Breasts: Deferred   Lab Results  Component Value Date   WBC 6.4 09/02/2018   HGB 13.4 09/02/2018   HCT 42.7 09/02/2018   MCV 85.1 09/02/2018   PLT 274 09/02/2018   Lab Results  Component Value Date   FERRITIN 9 (L) 07/22/2018   IRON 29 (L) 07/22/2018   TIBC 520 (H) 07/22/2018   UIBC 491 (H) 07/22/2018   IRONPCTSAT 6 (L) 07/22/2018   Lab Results  Component Value Date   RETICCTPCT 1.2 08/03/2010   RBC 5.02 09/02/2018   RETICCTABS 61.3 08/03/2010   No results found for: KPAFRELGTCHN, LAMBDASER, KAPLAMBRATIO No results found for: Kandis Cocking, IGMSERUM No results found for: Odetta Pink, SPEI   Chemistry      Component Value Date/Time   NA 138 07/22/2018 1529   NA 131 (L) 10/30/2017 1005   NA 139 01/03/2017 1327   NA 136 12/28/2015 1311   K 4.4 07/22/2018 1529   K 3.7 01/03/2017 1327   K 4.9 12/28/2015 1311   CL 97 (L) 07/22/2018 1529   CL 94 (L) 01/03/2017 1327   CO2 31 07/22/2018 1529   CO2 31 01/03/2017 1327   CO2 28 12/28/2015 1311   BUN 8 07/22/2018 1529   BUN 11 10/30/2017 1005   BUN 12 01/03/2017 1327   BUN 10.4 12/28/2015 1311   CREATININE 0.73 07/22/2018 1529    CREATININE 1.0 01/03/2017 1327   CREATININE 0.9 12/28/2015 1311      Component Value Date/Time   CALCIUM 9.5 07/22/2018 1529   CALCIUM 8.8 01/03/2017 1327   CALCIUM 9.6 12/28/2015 1311   ALKPHOS 87 07/22/2018 1529   ALKPHOS 85 (H) 01/03/2017 1327   ALKPHOS 83 12/28/2015 1311   AST 20 07/22/2018 1529   AST 25 12/28/2015 1311   ALT 10 07/22/2018 1529   ALT 19 01/03/2017 1327   ALT 13 12/28/2015 1311   BILITOT 0.4 07/22/2018 1529   BILITOT 0.74 12/28/2015 1311       Impression and Plan: [name] is a very pleasant 68 yo caucasian female with secondary polycythemia (smoking), 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  Her Hct is stable at 42.7%. No phlebotomy needed  a this time.  We will plan to see her back in another 6 weeks.  She will contact our office with any questions or concerns. We can certainly see her sooner if needed.   Laverna Peace, NP 7/13/20202:13 PM

## 2018-09-03 LAB — FERRITIN: Ferritin: 18 ng/mL (ref 11–307)

## 2018-09-03 LAB — IRON AND TIBC
Iron: 40 ug/dL — ABNORMAL LOW (ref 41–142)
Saturation Ratios: 8 % — ABNORMAL LOW (ref 21–57)
TIBC: 473 ug/dL — ABNORMAL HIGH (ref 236–444)
UIBC: 433 ug/dL — ABNORMAL HIGH (ref 120–384)

## 2018-09-12 DIAGNOSIS — D45 Polycythemia vera: Secondary | ICD-10-CM | POA: Diagnosis not present

## 2018-09-12 DIAGNOSIS — J449 Chronic obstructive pulmonary disease, unspecified: Secondary | ICD-10-CM | POA: Diagnosis not present

## 2018-09-12 DIAGNOSIS — G479 Sleep disorder, unspecified: Secondary | ICD-10-CM | POA: Diagnosis not present

## 2018-09-12 DIAGNOSIS — R7303 Prediabetes: Secondary | ICD-10-CM | POA: Diagnosis not present

## 2018-09-12 DIAGNOSIS — G4700 Insomnia, unspecified: Secondary | ICD-10-CM | POA: Diagnosis not present

## 2018-09-12 DIAGNOSIS — I779 Disorder of arteries and arterioles, unspecified: Secondary | ICD-10-CM | POA: Diagnosis not present

## 2018-09-12 DIAGNOSIS — F325 Major depressive disorder, single episode, in full remission: Secondary | ICD-10-CM | POA: Diagnosis not present

## 2018-09-12 DIAGNOSIS — I10 Essential (primary) hypertension: Secondary | ICD-10-CM | POA: Diagnosis not present

## 2018-09-12 DIAGNOSIS — I2510 Atherosclerotic heart disease of native coronary artery without angina pectoris: Secondary | ICD-10-CM | POA: Diagnosis not present

## 2018-09-12 DIAGNOSIS — F17210 Nicotine dependence, cigarettes, uncomplicated: Secondary | ICD-10-CM | POA: Diagnosis not present

## 2018-09-12 NOTE — Telephone Encounter (Signed)
Left message to call back.  Will close note and address if pt calls back.

## 2018-10-03 ENCOUNTER — Ambulatory Visit: Payer: PPO

## 2018-10-03 NOTE — Progress Notes (Deleted)
Patient ID: [name]                 DOB: 1950-04-20                      MRN: [id number]     HPI: [name] is a 68 y.o. female referred by [name] to HTN clinic. PMH is significant for HTN, CAD s/p stent in 2010, left carotid endarterectomy 07/2016, right carotid endarterectomy in 2007, polycythemia vera, tobacco abuse, COPD, GERD, Hepatitis C, and anxiety. Pt had a telehealth visit with Dr Tamala Julian on 07/03/18. BP was very elevated at 195/86 during that visit. Pt was noncompliant with her medications, exercise was minimal, and pt was not adhering to low sodium diet.  States she cannot remember to take her medications because she is busy caring for her 73 year old mother. Per last encounter, pt seems to have little understanding of the importance of risk factor modification or her underlying disease.   Tobacco cessation Med compliance? Exercise and diet K 4.9, normal renal function. Can inc HCTZ or toprol first  Current HTN meds: amlodipine 10mg  daily, HCTZ 12.5mg  daily, Toprol 150mg  daily, telmisartan 20mg  daily  BP goal: <130/72mmHg  Family History: The patient's family history includes Heart disease in her father and mother.  Social History: Smokes 1/4 PPD for 48 years, occasional alcohol use, denies illicit drug use.  Diet:   Exercise:   Home BP readings:   Wt Readings from Last 3 Encounters:  09/02/18 112 lb 12.8 oz (51.2 kg)  07/22/18 112 lb (50.8 kg)  07/03/18 114 lb 9.6 oz (52 kg)   BP Readings from Last 3 Encounters:  09/02/18 (!) 155/82  07/22/18 (!) 148/88  07/03/18 (!) 195/86   Pulse Readings from Last 3 Encounters:  09/02/18 60  07/22/18 62  07/03/18 67    Renal function: CrCl cannot be calculated (Patient's most recent lab result is older than the maximum 21 days allowed.).  Past Medical History:  Diagnosis Date  . Anxiety   . Arthritis   . CAD (coronary artery disease)   . COPD (chronic obstructive pulmonary disease)  ([place])   . Depression   . Dyspnea   . GERD (gastroesophageal reflux disease)   . HCV (hepatitis C virus)   . Hepatitis C   . HTN (hypertension)   . Hypertension   . Peripheral vascular disease ([place])   . Polycythemia   . Polycythemia 2009  . PVD (peripheral vascular disease) ([place])   . Seizures ([place]) 1970   x1- pt. reports that there was never any causative agent      Current Outpatient Medications on File Prior to Visit  Medication Sig Dispense Refill  . acetaminophen (TYLENOL) 325 MG tablet Take 325-650 mg by mouth every 6 (six) hours as needed for mild pain or headache.    . albuterol (PROAIR HFA) 108 (90 Base) MCG/ACT inhaler Inhale 2 puffs into the lungs every 6 (six) hours as needed for wheezing or shortness of breath.    . ALPRAZolam (XANAX) 0.5 MG tablet Take 0.5 mg by mouth at bedtime.     Marland Kitchen amLODipine (NORVASC) 10 MG tablet Take 10 mg by mouth daily.    . Ascorbic Acid (VITAMIN C PO) Take 2 tablets by mouth daily.    . Biotin 1000 MCG tablet Take 1,000 mcg by mouth daily.    Marland Kitchen BREO ELLIPTA 100-25 MCG/INH AEPB INHALE ONE PUFF ONCE A DAY    .  clopidogrel (PLAVIX) 75 MG tablet Take 1 tablet (75 mg total) by mouth daily. 90 tablet 3  . clotrimazole-betamethasone (LOTRISONE) cream Apply 1 application topically 2 (two) times daily as needed (to affected area).    . escitalopram (LEXAPRO) 10 MG tablet Take 10 mg by mouth daily.    . hydrochlorothiazide (MICROZIDE) 12.5 MG capsule Take 1 capsule by mouth once daily 90 capsule 3  . lansoprazole (PREVACID) 30 MG capsule Take 30 mg by mouth daily as needed (for ulcer/acid reflex). Ulcer/ acid reflux    . metoprolol succinate (TOPROL-XL) 100 MG 24 hr tablet Take 1  & 1/2 (one & one-half) tablets by mouth daily. 135 tablet 3  . rosuvastatin (CRESTOR) 10 MG tablet TAKE 1 TABLET BY MOUTH ONCE DAILY FOR 30 DAYS    . telmisartan (MICARDIS) 20 MG tablet Take 20 mg by mouth daily.    . vitamin C (ASCORBIC ACID) 500 MG tablet Take 500-1,000 mg by  mouth daily.    . vitamin E (VITAMIN E) 400 UNIT capsule Take 400 Units by mouth daily.     No current facility-administered medications on file prior to visit.     Allergies  Allergen Reactions  . Diphenhydramine Hives  . Sulfa Antibiotics Nausea And Vomiting  . Bactrim [Sulfamethoxazole-Trimethoprim] Nausea And Vomiting     Assessment/Plan:  1. Hypertension -

## 2018-10-08 DIAGNOSIS — S52271D Monteggia's fracture of right ulna, subsequent encounter for closed fracture with routine healing: Secondary | ICD-10-CM | POA: Diagnosis not present

## 2018-10-14 ENCOUNTER — Other Ambulatory Visit: Payer: Self-pay

## 2018-10-14 ENCOUNTER — Encounter: Payer: Self-pay | Admitting: Hematology & Oncology

## 2018-10-14 ENCOUNTER — Inpatient Hospital Stay (HOSPITAL_BASED_OUTPATIENT_CLINIC_OR_DEPARTMENT_OTHER): Payer: PPO | Admitting: Hematology & Oncology

## 2018-10-14 ENCOUNTER — Inpatient Hospital Stay: Payer: PPO | Attending: Hematology & Oncology

## 2018-10-14 VITALS — BP 164/72 | HR 66 | Temp 97.6°F | Resp 20 | Wt 114.8 lb

## 2018-10-14 DIAGNOSIS — D571 Sickle-cell disease without crisis: Secondary | ICD-10-CM | POA: Diagnosis not present

## 2018-10-14 DIAGNOSIS — Z79899 Other long term (current) drug therapy: Secondary | ICD-10-CM | POA: Insufficient documentation

## 2018-10-14 DIAGNOSIS — B1920 Unspecified viral hepatitis C without hepatic coma: Secondary | ICD-10-CM | POA: Insufficient documentation

## 2018-10-14 DIAGNOSIS — D500 Iron deficiency anemia secondary to blood loss (chronic): Secondary | ICD-10-CM

## 2018-10-14 DIAGNOSIS — D45 Polycythemia vera: Secondary | ICD-10-CM | POA: Diagnosis not present

## 2018-10-14 DIAGNOSIS — D751 Secondary polycythemia: Secondary | ICD-10-CM

## 2018-10-14 LAB — CBC WITH DIFFERENTIAL (CANCER CENTER ONLY)
Abs Immature Granulocytes: 0.02 10*3/uL (ref 0.00–0.07)
Basophils Absolute: 0 10*3/uL (ref 0.0–0.1)
Basophils Relative: 0 %
Eosinophils Absolute: 0.1 10*3/uL (ref 0.0–0.5)
Eosinophils Relative: 1 %
HCT: 42 % (ref 36.0–46.0)
Hemoglobin: 13.6 g/dL (ref 12.0–15.0)
Immature Granulocytes: 0 %
Lymphocytes Relative: 26 %
Lymphs Abs: 2 10*3/uL (ref 0.7–4.0)
MCH: 28.6 pg (ref 26.0–34.0)
MCHC: 32.4 g/dL (ref 30.0–36.0)
MCV: 88.4 fL (ref 80.0–100.0)
Monocytes Absolute: 0.8 10*3/uL (ref 0.1–1.0)
Monocytes Relative: 11 %
Neutro Abs: 4.6 10*3/uL (ref 1.7–7.7)
Neutrophils Relative %: 62 %
Platelet Count: 270 10*3/uL (ref 150–400)
RBC: 4.75 MIL/uL (ref 3.87–5.11)
RDW: 15.9 % — ABNORMAL HIGH (ref 11.5–15.5)
WBC Count: 7.6 10*3/uL (ref 4.0–10.5)
nRBC: 0 % (ref 0.0–0.2)

## 2018-10-14 LAB — CMP (CANCER CENTER ONLY)
ALT: 14 U/L (ref 0–44)
AST: 21 U/L (ref 15–41)
Albumin: 4.1 g/dL (ref 3.5–5.0)
Alkaline Phosphatase: 75 U/L (ref 38–126)
Anion gap: 9 (ref 5–15)
BUN: 11 mg/dL (ref 8–23)
CO2: 29 mmol/L (ref 22–32)
Calcium: 9 mg/dL (ref 8.9–10.3)
Chloride: 94 mmol/L — ABNORMAL LOW (ref 98–111)
Creatinine: 0.73 mg/dL (ref 0.44–1.00)
GFR, Est AFR Am: >60 mL/min (ref >60)
GFR, Estimated: >60 mL/min (ref >60)
Glucose, Bld: 93 mg/dL (ref 70–99)
Potassium: 4.3 mmol/L (ref 3.5–5.1)
Sodium: 132 mmol/L — ABNORMAL LOW (ref 135–145)
Total Bilirubin: 0.6 mg/dL (ref 0.3–1.2)
Total Protein: 7 g/dL (ref 6.5–8.1)

## 2018-10-14 NOTE — Progress Notes (Signed)
Hematology and Oncology Follow Up Visit  [name] [id number] 1951/02/07 68 y.o. 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  She feels okay.  She is still smoking.  She is still helping to take care of her 52 year old mom.  She has had no problems with her weight.  Her weight is starting to go back up a little bit.  She has had no issues with fever.  There is been no cough.  She has had no change in bowel or bladder habits.  She has had no leg swelling.  There is been no issues with rashes.  She clearly is iron deficient.  This is exactly what we need to see.  Overall, her performance status is ECOG 1.   Medications:  Allergies as of 10/14/2018      Reactions   Diphenhydramine Hives   Sulfa Antibiotics Nausea And Vomiting   Bactrim [sulfamethoxazole-trimethoprim] Nausea And Vomiting      Medication List       Accurate as of October 14, 2018  3:53 PM. If you have any questions, ask your nurse or doctor.        acetaminophen 325 MG tablet Commonly known as: TYLENOL Take 325-650 mg by mouth every 6 (six) hours as needed for mild pain or headache.   ALPRAZolam 0.5 MG tablet Commonly known as: XANAX Take 0.5 mg by mouth at bedtime.   amLODipine 10 MG tablet Commonly known as: NORVASC Take 10 mg by mouth daily.   Biotin 1000 MCG tablet Take 1,000 mcg by mouth daily.   Breo Ellipta 100-25 MCG/INH Aepb Generic drug: fluticasone furoate-vilanterol INHALE ONE PUFF ONCE A DAY   clopidogrel 75 MG tablet Commonly known as: PLAVIX Take 1 tablet (75 mg total) by mouth daily.   clotrimazole-betamethasone cream Commonly known as: LOTRISONE Apply 1 application topically 2  (two) times daily as needed (to affected area).   escitalopram 10 MG tablet Commonly known as: LEXAPRO Take 10 mg by mouth daily.   hydrochlorothiazide 12.5 MG capsule Commonly known as: MICROZIDE Take 1 capsule by mouth once daily   lansoprazole 30 MG capsule Commonly known as: PREVACID Take 30 mg by mouth daily as needed (for ulcer/acid reflex). Ulcer/ acid reflux   metoprolol succinate 100 MG 24 hr tablet Commonly known as: TOPROL-XL Take 1  & 1/2 (one & one-half) tablets by mouth daily.   ProAir HFA 108 (90 Base) MCG/ACT inhaler Generic drug: albuterol Inhale 2 puffs into the lungs every 6 (six) hours as needed for wheezing or shortness of breath.   rosuvastatin 10 MG tablet Commonly known as: CRESTOR every other day.   telmisartan 20 MG tablet Commonly known as: MICARDIS Take 20 mg by mouth daily.   vitamin C 500 MG tablet Commonly known as: ASCORBIC ACID Take 500-1,000 mg by mouth daily.   VITAMIN C PO Take 2 tablets by mouth daily.   vitamin E 400 UNIT capsule Generic drug: vitamin E Take 400 Units by mouth daily.       Allergies:  Allergies  Allergen Reactions  . Diphenhydramine Hives  . Sulfa Antibiotics Nausea And Vomiting  . Bactrim [Sulfamethoxazole-Trimethoprim] Nausea And Vomiting  Past Medical History, Surgical history, Social history, and Family History were reviewed and updated.  Review of Systems: Review of Systems  Constitutional: Positive for malaise/fatigue.  HENT: Negative.   Eyes: Negative.   Respiratory: Negative.   Cardiovascular: Negative.   Gastrointestinal: Positive for abdominal pain, diarrhea and nausea.  Genitourinary: Negative.   Musculoskeletal: Negative.   Skin: Negative.   Neurological: Negative.   Endo/Heme/Allergies: Negative.   Psychiatric/Behavioral: Negative.      Physical Exam:  weight is 114 lb 12.8 oz (52.1 kg). Her oral temperature is 97.6 F (36.4 C). Her blood pressure is 164/72 (abnormal) and her  pulse is 66. Her respiration is 20 and oxygen saturation is 99%.   Wt Readings from Last 3 Encounters:  10/14/18 114 lb 12.8 oz (52.1 kg)  09/02/18 112 lb 12.8 oz (51.2 kg)  07/22/18 112 lb (50.8 kg)    Physical Exam Vitals signs reviewed.  HENT:     Head: Normocephalic and atraumatic.  Eyes:     Pupils: Pupils are equal, round, and reactive to light.  Neck:     Musculoskeletal: Normal range of motion.  Cardiovascular:     Rate and Rhythm: Normal rate and regular rhythm.     Heart sounds: Normal heart sounds.  Pulmonary:     Effort: Pulmonary effort is normal.     Breath sounds: Normal breath sounds.  Abdominal:     General: Bowel sounds are normal.     Palpations: Abdomen is soft.  Musculoskeletal: Normal range of motion.        General: No tenderness or deformity.  Lymphadenopathy:     Cervical: No cervical adenopathy.  Skin:    General: Skin is warm and dry.     Findings: No erythema or rash.  Neurological:     Mental Status: She is alert and oriented to person, place, and time.  Psychiatric:        Behavior: Behavior normal.        Thought Content: Thought content normal.        Judgment: Judgment normal.     Lab Results  Component Value Date   WBC 7.6 10/14/2018   HGB 13.6 10/14/2018   HCT 42.0 10/14/2018   MCV 88.4 10/14/2018   PLT 270 10/14/2018   Lab Results  Component Value Date   FERRITIN 18 09/02/2018   IRON 40 (L) 09/02/2018   TIBC 473 (H) 09/02/2018   UIBC 433 (H) 09/02/2018   IRONPCTSAT 8 (L) 09/02/2018   Lab Results  Component Value Date   RETICCTPCT 1.2 08/03/2010   RBC 4.75 10/14/2018   RETICCTABS 61.3 08/03/2010   No results found for: KPAFRELGTCHN, LAMBDASER, KAPLAMBRATIO No results found for: IGGSERUM, IGA, IGMSERUM No results found for: Kathrynn Ducking, MSPIKE, SPEI   Chemistry      Component Value Date/Time   NA 132 (L) 10/14/2018 1437   NA 131 (L) 10/30/2017 1005   NA 139  01/03/2017 1327   NA 136 12/28/2015 1311   K 4.3 10/14/2018 1437   K 3.7 01/03/2017 1327   K 4.9 12/28/2015 1311   CL 94 (L) 10/14/2018 1437   CL 94 (L) 01/03/2017 1327   CO2 29 10/14/2018 1437   CO2 31 01/03/2017 1327   CO2 28 12/28/2015 1311   BUN 11 10/14/2018 1437   BUN 11 10/30/2017 1005   BUN 12 01/03/2017 1327   BUN 10.4 12/28/2015 1311   CREATININE 0.73 10/14/2018 1437   CREATININE  1.0 01/03/2017 1327   CREATININE 0.9 12/28/2015 1311      Component Value Date/Time   CALCIUM 9.0 10/14/2018 1437   CALCIUM 8.8 01/03/2017 1327   CALCIUM 9.6 12/28/2015 1311   ALKPHOS 75 10/14/2018 1437   ALKPHOS 85 (H) 01/03/2017 1327   ALKPHOS 83 12/28/2015 1311   AST 21 10/14/2018 1437   AST 25 12/28/2015 1311   ALT 14 10/14/2018 1437   ALT 19 01/03/2017 1327   ALT 13 12/28/2015 1311   BILITOT 0.6 10/14/2018 1437   BILITOT 0.74 12/28/2015 1311     Impression and Plan: [name] is a 68 yo white female with polycythemia.   I want to see her back in 2 months..  I think this is reasonable for follow-up if she is got be on some iron pills.  She deftly does not need to be phlebotomized right now.    [name], MD 8/24/20203:53 PM

## 2018-10-15 ENCOUNTER — Telehealth: Payer: Self-pay | Admitting: Hematology & Oncology

## 2018-10-15 LAB — IRON AND TIBC
Iron: 66 ug/dL (ref 41–142)
Saturation Ratios: 14 % — ABNORMAL LOW (ref 21–57)
TIBC: 468 ug/dL — ABNORMAL HIGH (ref 236–444)
UIBC: 402 ug/dL — ABNORMAL HIGH (ref 120–384)

## 2018-10-15 LAB — FERRITIN: Ferritin: 18 ng/mL (ref 11–307)

## 2018-10-15 NOTE — Telephone Encounter (Signed)
lmom to inform patient of Oct appts per 8/24 los

## 2018-11-27 DIAGNOSIS — R52 Pain, unspecified: Secondary | ICD-10-CM | POA: Diagnosis not present

## 2018-11-27 DIAGNOSIS — R6883 Chills (without fever): Secondary | ICD-10-CM | POA: Diagnosis not present

## 2018-11-27 DIAGNOSIS — R110 Nausea: Secondary | ICD-10-CM | POA: Diagnosis not present

## 2018-12-18 ENCOUNTER — Inpatient Hospital Stay: Payer: PPO

## 2018-12-18 ENCOUNTER — Ambulatory Visit: Payer: PPO | Admitting: Hematology & Oncology

## 2018-12-19 ENCOUNTER — Ambulatory Visit: Payer: PPO | Admitting: Hematology & Oncology

## 2018-12-19 ENCOUNTER — Inpatient Hospital Stay: Payer: PPO

## 2018-12-20 ENCOUNTER — Ambulatory Visit: Payer: PPO | Admitting: Hematology & Oncology

## 2018-12-20 ENCOUNTER — Other Ambulatory Visit: Payer: PPO

## 2019-01-01 ENCOUNTER — Inpatient Hospital Stay: Payer: PPO | Attending: Hematology & Oncology

## 2019-01-01 ENCOUNTER — Inpatient Hospital Stay (HOSPITAL_BASED_OUTPATIENT_CLINIC_OR_DEPARTMENT_OTHER): Payer: PPO | Admitting: Family

## 2019-01-01 ENCOUNTER — Other Ambulatory Visit: Payer: Self-pay

## 2019-01-01 DIAGNOSIS — D500 Iron deficiency anemia secondary to blood loss (chronic): Secondary | ICD-10-CM

## 2019-01-01 DIAGNOSIS — B1920 Unspecified viral hepatitis C without hepatic coma: Secondary | ICD-10-CM | POA: Insufficient documentation

## 2019-01-01 DIAGNOSIS — F17210 Nicotine dependence, cigarettes, uncomplicated: Secondary | ICD-10-CM | POA: Insufficient documentation

## 2019-01-01 DIAGNOSIS — D45 Polycythemia vera: Secondary | ICD-10-CM

## 2019-01-01 DIAGNOSIS — D751 Secondary polycythemia: Secondary | ICD-10-CM

## 2019-01-01 DIAGNOSIS — Z79899 Other long term (current) drug therapy: Secondary | ICD-10-CM | POA: Insufficient documentation

## 2019-01-01 LAB — CBC WITH DIFFERENTIAL (CANCER CENTER ONLY)
Abs Immature Granulocytes: 0.01 10*3/uL (ref 0.00–0.07)
Basophils Absolute: 0 10*3/uL (ref 0.0–0.1)
Basophils Relative: 0 %
Eosinophils Absolute: 0.1 10*3/uL (ref 0.0–0.5)
Eosinophils Relative: 1 %
HCT: 42.6 % (ref 36.0–46.0)
Hemoglobin: 14.2 g/dL (ref 12.0–15.0)
Immature Granulocytes: 0 %
Lymphocytes Relative: 32 %
Lymphs Abs: 2.1 10*3/uL (ref 0.7–4.0)
MCH: 30.6 pg (ref 26.0–34.0)
MCHC: 33.3 g/dL (ref 30.0–36.0)
MCV: 91.8 fL (ref 80.0–100.0)
Monocytes Absolute: 0.8 10*3/uL (ref 0.1–1.0)
Monocytes Relative: 12 %
Neutro Abs: 3.7 10*3/uL (ref 1.7–7.7)
Neutrophils Relative %: 55 %
Platelet Count: 251 10*3/uL (ref 150–400)
RBC: 4.64 MIL/uL (ref 3.87–5.11)
RDW: 13.2 % (ref 11.5–15.5)
WBC Count: 6.7 10*3/uL (ref 4.0–10.5)
nRBC: 0 % (ref 0.0–0.2)

## 2019-01-01 LAB — CMP (CANCER CENTER ONLY)
ALT: 10 U/L (ref 0–44)
AST: 18 U/L (ref 15–41)
Albumin: 4.4 g/dL (ref 3.5–5.0)
Alkaline Phosphatase: 70 U/L (ref 38–126)
Anion gap: 7 (ref 5–15)
BUN: 11 mg/dL (ref 8–23)
CO2: 33 mmol/L — ABNORMAL HIGH (ref 22–32)
Calcium: 9.8 mg/dL (ref 8.9–10.3)
Chloride: 96 mmol/L — ABNORMAL LOW (ref 98–111)
Creatinine: 0.71 mg/dL (ref 0.44–1.00)
GFR, Est AFR Am: >60 mL/min (ref >60)
GFR, Estimated: >60 mL/min (ref >60)
Glucose, Bld: 124 mg/dL — ABNORMAL HIGH (ref 70–99)
Potassium: 5 mmol/L (ref 3.5–5.1)
Sodium: 136 mmol/L (ref 135–145)
Total Bilirubin: 0.6 mg/dL (ref 0.3–1.2)
Total Protein: 6.7 g/dL (ref 6.5–8.1)

## 2019-01-01 NOTE — Progress Notes (Signed)
Hematology and Oncology Follow Up Visit  Makailynn Hurlock [id number] [birth year] 68 y.o. 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 Heinlen is here today for follow-up. She is doing well but still has some fatigue.  She is staying busy working in her yard and taking care of her mother who is about to turn 100.  Hct is stable at 42.6%.  No fever, chills, hot flashes, night sweats, n/v, cough, rash, dizziness, SOB, chest pain, abdominal pain or changes in bowel or bladder habits.  She has occasional palpitations. She notes that they happen with stress.  She is still smoking 1-1 1/2 ppd.  No swelling in her extremities at this time.  She has numbness and tingling in her hands that comes and goes.  No falls or syncopal episodes.  She has maintained a good appetite and is staying well hydrated. Her weight is stable.  No episodes of bleeding. No bruising or petechiae.   ECOG Performance Status: 1 - Symptomatic but completely ambulatory  Medications:  Allergies as of 01/01/2019      Reactions   Diphenhydramine Hives   Sulfa Antibiotics Nausea And Vomiting   Bactrim [sulfamethoxazole-trimethoprim] Nausea And Vomiting      Medication List       Accurate as of January 01, 2019  1:52 PM. If you have any questions, ask your nurse or doctor.        acetaminophen 325 MG tablet Commonly known as: TYLENOL Take 325-650 mg by mouth every 6 (six) hours as needed for mild pain or headache.   ALPRAZolam 0.5 MG tablet Commonly known as: XANAX Take 0.5 mg by mouth at bedtime.   amLODipine 10 MG tablet Commonly known as: NORVASC Take 10 mg by mouth daily.   Biotin 1000 MCG tablet Take 1,000 mcg by mouth daily.   Breo Ellipta 100-25  MCG/INH Aepb Generic drug: fluticasone furoate-vilanterol INHALE ONE PUFF ONCE A DAY   clopidogrel 75 MG tablet Commonly known as: PLAVIX Take 1 tablet (75 mg total) by mouth daily.   clotrimazole-betamethasone cream Commonly known as: LOTRISONE Apply 1 application topically 2 (two) times daily as needed (to affected area).   escitalopram 10 MG tablet Commonly known as: LEXAPRO Take 10 mg by mouth daily.   hydrochlorothiazide 12.5 MG capsule Commonly known as: MICROZIDE Take 1 capsule by mouth once daily   lansoprazole 30 MG capsule Commonly known as: PREVACID Take 30 mg by mouth daily as needed (for ulcer/acid reflex). Ulcer/ acid reflux   metoprolol succinate 100 MG 24 hr tablet Commonly known as: TOPROL-XL Take 1  & 1/2 (one & one-half) tablets by mouth daily.   ProAir HFA 108 (90 Base) MCG/ACT inhaler Generic drug: albuterol Inhale 2 puffs into the lungs every 6 (six) hours as needed for wheezing or shortness of breath.   rosuvastatin 10 MG tablet Commonly known as: CRESTOR every other day.   telmisartan 20 MG tablet Commonly known as: MICARDIS Take 20 mg by mouth daily.   vitamin C 500 MG tablet Commonly known as: ASCORBIC ACID Take 500-1,000 mg by mouth daily.   VITAMIN C PO Take 2 tablets by mouth daily.   vitamin E 400 UNIT capsule Generic drug: vitamin E Take 400 Units by  mouth daily.       Allergies:  Allergies  Allergen Reactions  . Diphenhydramine Hives  . Sulfa Antibiotics Nausea And Vomiting  . Bactrim [Sulfamethoxazole-Trimethoprim] Nausea And Vomiting    Past Medical History, Surgical history, Social history, and Family History were reviewed and updated.  Review of Systems: All other 10 point review of systems is negative.   Physical Exam:  vitals were not taken for this visit.   Wt Readings from Last 3 Encounters:  10/14/18 114 lb 12.8 oz (52.1 kg)  09/02/18 112 lb 12.8 oz (51.2 kg)  07/22/18 112 lb (50.8 kg)    Ocular:  Sclerae unicteric, pupils equal, round and reactive to light Ear-nose-throat: Oropharynx clear, dentition fair Lymphatic: No cervical or supraclavicular adenopathy Lungs no rales or rhonchi, good excursion bilaterally Heart regular rate and rhythm, no murmur appreciated Abd soft, nontender, positive bowel sounds, No liver or spleen tip palpated on exam, no fluid wave  MSK no focal spinal tenderness, no joint edema Neuro: non-focal, well-oriented, appropriate affect Breasts: Deferred   Lab Results  Component Value Date   WBC 6.7 01/01/2019   HGB 14.2 01/01/2019   HCT 42.6 01/01/2019   MCV 91.8 01/01/2019   PLT 251 01/01/2019   Lab Results  Component Value Date   FERRITIN 18 10/14/2018   IRON 66 10/14/2018   TIBC 468 (H) 10/14/2018   UIBC 402 (H) 10/14/2018   IRONPCTSAT 14 (L) 10/14/2018   Lab Results  Component Value Date   RETICCTPCT 1.2 08/03/2010   RBC 4.64 01/01/2019   RETICCTABS 61.3 08/03/2010   No results found for: KPAFRELGTCHN, LAMBDASER, KAPLAMBRATIO No results found for: Kandis Cocking, IGMSERUM No results found for: Odetta Pink, SPEI   Chemistry      Component Value Date/Time   NA 132 (L) 10/14/2018 1437   NA 131 (L) 10/30/2017 1005   NA 139 01/03/2017 1327   NA 136 12/28/2015 1311   K 4.3 10/14/2018 1437   K 3.7 01/03/2017 1327   K 4.9 12/28/2015 1311   CL 94 (L) 10/14/2018 1437   CL 94 (L) 01/03/2017 1327   CO2 29 10/14/2018 1437   CO2 31 01/03/2017 1327   CO2 28 12/28/2015 1311   BUN 11 10/14/2018 1437   BUN 11 10/30/2017 1005   BUN 12 01/03/2017 1327   BUN 10.4 12/28/2015 1311   CREATININE 0.73 10/14/2018 1437   CREATININE 1.0 01/03/2017 1327   CREATININE 0.9 12/28/2015 1311      Component Value Date/Time   CALCIUM 9.0 10/14/2018 1437   CALCIUM 8.8 01/03/2017 1327   CALCIUM 9.6 12/28/2015 1311   ALKPHOS 75 10/14/2018 1437   ALKPHOS 85 (H) 01/03/2017 1327   ALKPHOS 83 12/28/2015 1311    AST 21 10/14/2018 1437   AST 25 12/28/2015 1311   ALT 14 10/14/2018 1437   ALT 19 01/03/2017 1327   ALT 13 12/28/2015 1311   BILITOT 0.6 10/14/2018 1437   BILITOT 0.74 12/28/2015 1311       Impression and Plan: Ms. 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  She is doing well. Hct is 42.6% so no phlebotomy needed this visit.  We will plan to see her back in another 2 months.  She will contact our office with any questions or concerns. We can certainly see her sooner if needed.   Laverna Peace, NP 11/11/20201:52 PM

## 2019-01-02 LAB — IRON AND TIBC
Iron: 75 ug/dL (ref 41–142)
Saturation Ratios: 18 % — ABNORMAL LOW (ref 21–57)
TIBC: 415 ug/dL (ref 236–444)
UIBC: 340 ug/dL (ref 120–384)

## 2019-01-02 LAB — FERRITIN: Ferritin: 25 ng/mL (ref 11–307)

## 2019-01-03 ENCOUNTER — Telehealth: Payer: Self-pay | Admitting: Interventional Cardiology

## 2019-01-03 MED ORDER — HYDROCHLOROTHIAZIDE 25 MG PO TABS: 25.0000 mg | ORAL_TABLET | Freq: Every day | ORAL | 3 refills | Status: DC

## 2019-01-03 NOTE — Telephone Encounter (Signed)
Pt states she was at a friend's house last night and began to feel chest pressure.  Left and went home.  Took BP and it was 196/106. Took Nitro and BP dropped to 96/49, HR 53.  Chest pressure eventually return a short time late.  Pt took ASA because BP was so low and laid down.  Woke up around 1AM sweating.  Was able to go back to sleep.  States was more SOB than usual when pressure was occurring.  Denies lightheadedness, dizziness or blurred vision.  Did get a HA after taking Nitro.  This morning BP was 173/82, HR 59.  Pt had just taken meds 15 mins prior to calling.  Denies any issues today.  Spoke with Dr. Harrington Challenger and she said to have pt increase HCTZ to 25mg  QD and have BMET in 7-10 days.  Spoke with pt and went over recommendations.  Pt admits that she doesn't take her Amlodipine all the time.  Advised pt to take her meds as prescribed, including the new dose of HCTZ, monitor BP and let us know if it continues to remain high.  Pt had virtual visit with [name] in May and was told to f/u in 3 months with HTN clinic.  Pt no showed for this appt.  Scheduled pt to see Truitt Merle, NP on 11/23 to f/u on BP and sx.  Pt verbalized understanding and was in agreement with this plan. Advised if sx return, call 911 and go to ER.  Pt appreciative for assistance.

## 2019-01-03 NOTE — Telephone Encounter (Signed)
° ° ° ° °  1. Are you having CP right now? No, chest pressure last night  2. Are you experiencing any other symptoms (ex. SOB, nausea, vomiting, sweating)? No  3. How long have you been experiencing CP? 1 day  4. Is your CP continuous or coming and going? Coming and going  5. Have you taken Nitroglycerin? Yes around 8pm last night   Pt c/o BP issue: STAT if pt c/o blurred vision, one-sided weakness or slurred speech  1. What are your last 5 BP readings? 196/106  2. Are you having any other symptoms (ex. Dizziness, headache, blurred vision, passed out)? Headache on 11/12  3. What is your BP issue? High BP

## 2019-01-10 NOTE — Progress Notes (Deleted)
CARDIOLOGY OFFICE NOTE  Date:  01/10/2019    [name] Date of Birth: Jun 16, 1950 Medical Record [id number]  PCP:  Patient, No Pcp Per  Cardiologist:  Tamala Julian   No chief complaint on file.   History of Present Illness: [name] is a 68 y.o. female who presents today for a 6 month check. Seen for Dr.Smith.   She has a history of HTN, CAD with prior stent in 2010, left CEA 2018, right CEA 2007, polycythemia vera, Hepatitis C, ongoing tobacco abuse, COPD, GERD and anxiety.   Noted long standing non compliance.   Had telehealth visit with [name] back in May - noted frustration over her non compliance. Noted poor insight into her issues.   The patient {does/does not:200015} have symptoms concerning for COVID-19 infection (fever, chills, cough, or new shortness of breath).   Comes in today. Here with   Past Medical History:  Diagnosis Date  . Anxiety   . Arthritis   . CAD (coronary artery disease)   . COPD (chronic obstructive pulmonary disease) ([place])   . Depression   . Dyspnea   . GERD (gastroesophageal reflux disease)   . HCV (hepatitis C virus)   . Hepatitis C   . HTN (hypertension)   . Hypertension   . Peripheral vascular disease ([place])   . Polycythemia   . Polycythemia 2009  . PVD (peripheral vascular disease) ([place])   . Seizures ([place]) 1970   x1- pt. reports that there was never any causative agent      Past Surgical History:  Procedure Laterality Date  . 2 stints  Aug 26,2010   Dr. Tawnya Crook  . ABDOMINAL SURGERY  1970's   for excessive bleeding from loss of pregnancy, required blood transfusion post procedure   . CAROTID ENDARTERECTOMY  2007  . cartoid endartherectomy  2007  . CORONARY ANGIOPLASTY WITH STENT PLACEMENT  2010  . CORONARY ANGIOPLASTY WITH STENT PLACEMENT  10/09/2008  . ENDARTERECTOMY Left 07/24/2016   Procedure: ENDARTERECTOMY CAROTID;  Surgeon: [name], MD;  Location: [facility];  Service: Vascular;   Laterality: Left;  . FLEXIBLE SIGMOIDOSCOPY N/A 03/22/2017   Procedure: FLEXIBLE SIGMOIDOSCOPY;  Surgeon: Carol Ada, MD;  Location: [facility];  Service: Endoscopy;  Laterality: N/A;  . LYMPH NODE DISSECTION Left 1990's   benign   . ORIF ELBOW FRACTURE Right 04/26/2018   Procedure: OPEN REDUCTION INTERNAL FIXATION (ORIF) ELBOW/OLECRANON FRACTURE;  Surgeon: Shona Needles, MD;  Location: [facility];  Service: Orthopedics;  Laterality: Right;  . TUBAL LIGATION       Medications: No outpatient medications have been marked as taking for the 01/13/19 encounter (Appointment) with Burtis Junes, NP.     Allergies: Allergies  Allergen Reactions  . Diphenhydramine Hives  . Sulfa Antibiotics Nausea And Vomiting  . Bactrim [Sulfamethoxazole-Trimethoprim] Nausea And Vomiting    Social History: The patient  reports that she has been smoking cigarettes. She started smoking about 50 years ago. She has a 48.00 pack-year smoking history. She has never used smokeless tobacco. She reports current alcohol use. She reports that she does not use drugs.   Family History: The patient's ***family history includes Heart disease in her father and mother.   Review of Systems: Please see the history of present illness.   All other systems are reviewed and negative.   Physical Exam: VS:  There were no vitals taken for this visit. Marland Kitchen  BMI There is no height or weight on file  to calculate BMI.  Wt Readings from Last 3 Encounters:  10/14/18 114 lb 12.8 oz (52.1 kg)  09/02/18 112 lb 12.8 oz (51.2 kg)  07/22/18 112 lb (50.8 kg)    General: Pleasant. Well developed, well nourished and in no acute distress.   HEENT: Normal.  Neck: Supple, no JVD, carotid bruits, or masses noted.  Cardiac: ***Regular rate and rhythm. No murmurs, rubs, or gallops. No edema.  Respiratory:  Lungs are clear to auscultation bilaterally with normal work of breathing.  GI: Soft and nontender.  MS: No deformity or atrophy. Gait  and ROM intact.  Skin: Warm and dry. Color is normal.  Neuro:  Strength and sensation are intact and no gross focal deficits noted.  Psych: Alert, appropriate and with normal affect.   LABORATORY DATA:  EKG:  EKG {ACTION; IS/IS [id number] ordered today. This demonstrates ***.  Lab Results  Component Value Date   WBC 6.7 01/01/2019   HGB 14.2 01/01/2019   HCT 42.6 01/01/2019   PLT 251 01/01/2019   GLUCOSE 124 (H) 01/01/2019   CHOL 142 12/04/2017   TRIG 95 12/04/2017   HDL 62 12/04/2017   LDLCALC 61 12/04/2017   ALT 10 01/01/2019   AST 18 01/01/2019   NA 136 01/01/2019   K 5.0 01/01/2019   CL 96 (L) 01/01/2019   CREATININE 0.71 01/01/2019   BUN 11 01/01/2019   CO2 33 (H) 01/01/2019   TSH 3.731 01/03/2017   INR 1.1 04/26/2018     BNP (last 3 results) No results for input(s): BNP in the last 8760 hours.  ProBNP (last 3 results) No results for input(s): PROBNP in the last 8760 hours.      Other Studies Reviewed Today:  Myoview Study Highlights 10/2017    Nuclear stress EF: 52%.  There was no ST segment deviation noted during stress.  This is a low risk study.  The left ventricular ejection fraction is mildly decreased (45-54%).   1. EF 52%.  Gating does not look ideal, would confirm EF with echo.  2. No evidence for ischemia or infarction on perfusion images.   Low risk study.   Notes recorded by Belva Crome, MD on 11/08/2017 at 8:16 AM EDT  I dont think she needs an echo. EF is normal (low rang)    Assessment/Plan:   1. CAD  2. HTN  3. HLD  4. Tobacco abuse  5. COPD  6. Hepatitis C  7. Polycythemia vera  8. Non compliance   9. Carotid disease  10. COVID-19 Education: The signs and symptoms of COVID-19 were discussed with the patient and how to seek care for testing (follow up with PCP or arrange E-visit).  The importance of social distancing, staying at home, hand hygiene and wearing a mask when out in public were discussed  today.  Current medicines are reviewed with the patient today.  The patient does not have concerns regarding medicines other than what has been noted above.  The following changes have been made:  See above.  Labs/ tests ordered today include:   No orders of the defined types were placed in this encounter.    Disposition:   FU with *** in {gen number [id number] {Days to years:10300}.   Patient is agreeable to this plan and will call if any problems develop in the interim.   SignedTruitt Merle, NP  01/10/2019 11:19 AM  [facility] [address] [place], [place]  [address] Phone: [phone] Fax: [phone]

## 2019-01-12 ENCOUNTER — Other Ambulatory Visit: Payer: Self-pay | Admitting: Cardiology

## 2019-01-12 DIAGNOSIS — Z20822 Contact with and (suspected) exposure to covid-19: Secondary | ICD-10-CM

## 2019-01-13 ENCOUNTER — Ambulatory Visit: Payer: PPO | Admitting: Nurse Practitioner

## 2019-01-13 LAB — NOVEL CORONAVIRUS, NAA: SARS-CoV-2, NAA: NOT DETECTED

## 2019-01-21 ENCOUNTER — Other Ambulatory Visit: Payer: Self-pay | Admitting: Interventional Cardiology

## 2019-01-23 ENCOUNTER — Telehealth: Payer: Self-pay | Admitting: Hematology & Oncology

## 2019-01-23 NOTE — Telephone Encounter (Signed)
Appointments scheduled letter/calendar mailed per per 11/11 los

## 2019-01-29 ENCOUNTER — Other Ambulatory Visit: Payer: Self-pay | Admitting: Interventional Cardiology

## 2019-02-05 IMAGING — CT CT ABD-PELV W/ CM
2 of 5 series · 12 of 36 positions shown, 15 images · IV contrast (APPLIED)
Comparison: Multiple exams, including 03/01/2015 and 10/07/2014

CLINICAL DATA: Hepatitis c. Unintentional weight loss. Liver
lesion.

EXAM:
CT CHEST, ABDOMEN, AND PELVIS WITH CONTRAST
TECHNIQUE: Multidetector CT imaging of the chest, abdomen and pelvis was
performed following the standard protocol during bolus
administration of intravenous contrast.
CONTRAST:  100mL 7UI7O5-NSS IOPAMIDOL (7UI7O5-NSS) INJECTION 61%

[Series 2: cap with 2 · axial · 0.78mm/px · z∈[-663,-148]mm · 9 of 129 slices shown, 12 images]
[im 13/129  mediastinal]
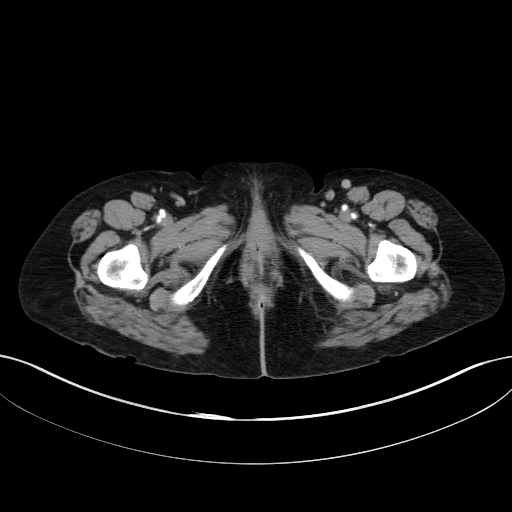
[im 13/129  lung]
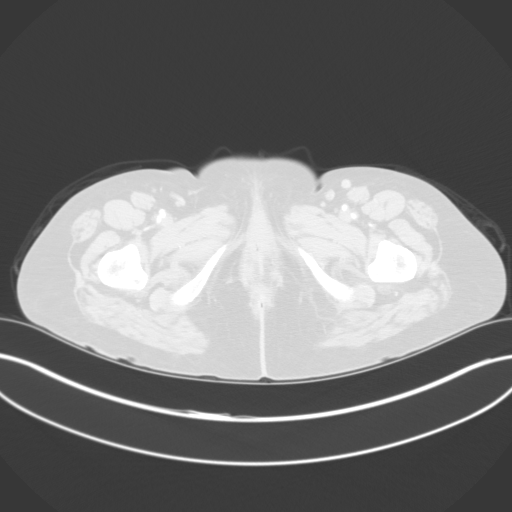
[im 26/129  lung]
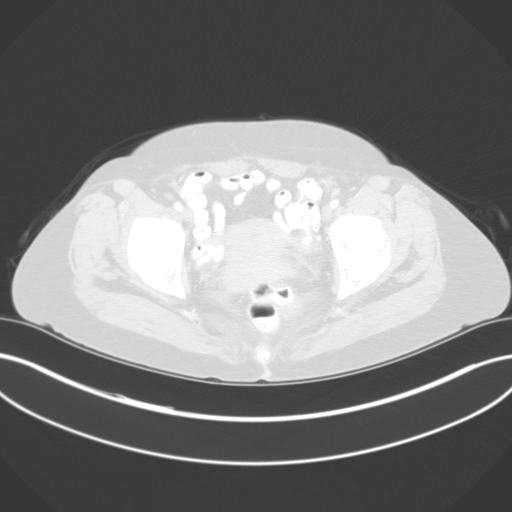
[im 39/129  lung]
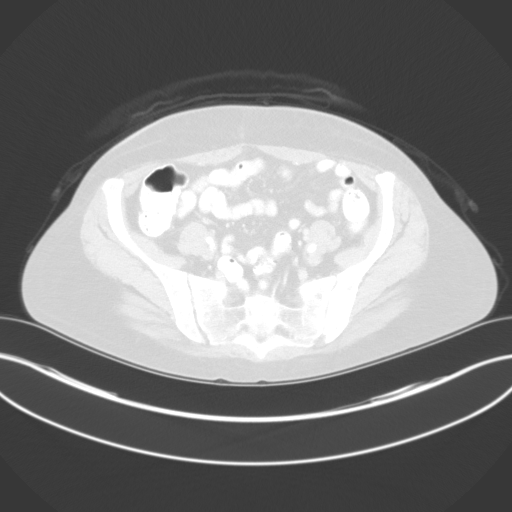
[im 52/129  lung]
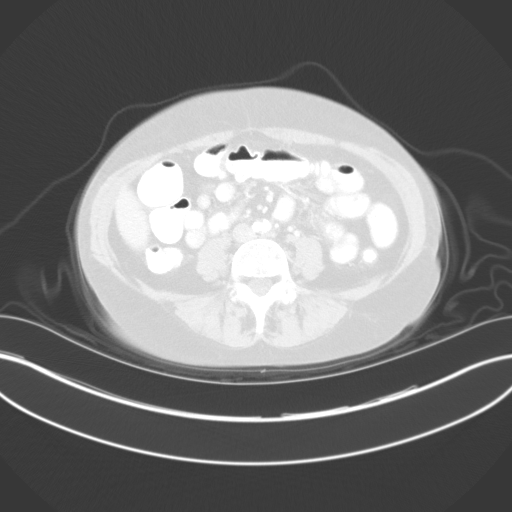
[im 65/129  mediastinal]
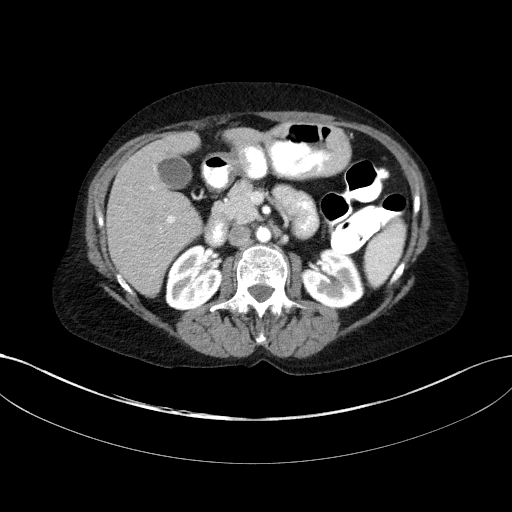
[im 65/129  lung]
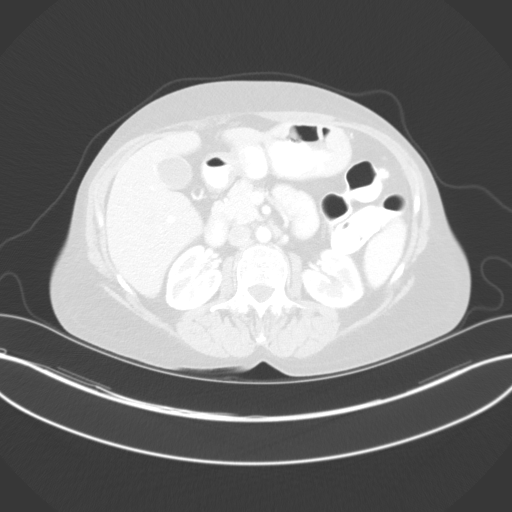
[im 77/129  lung]
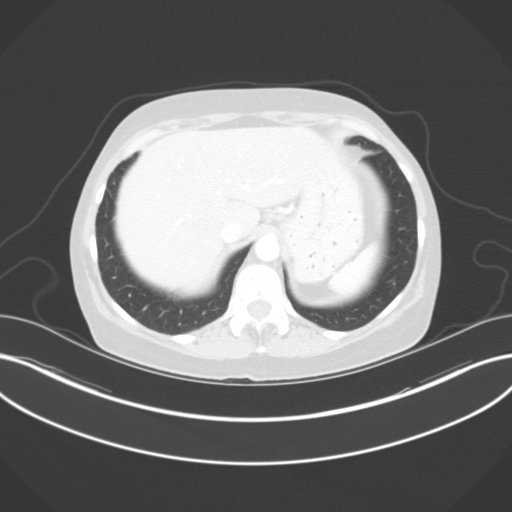
[im 90/129  lung]
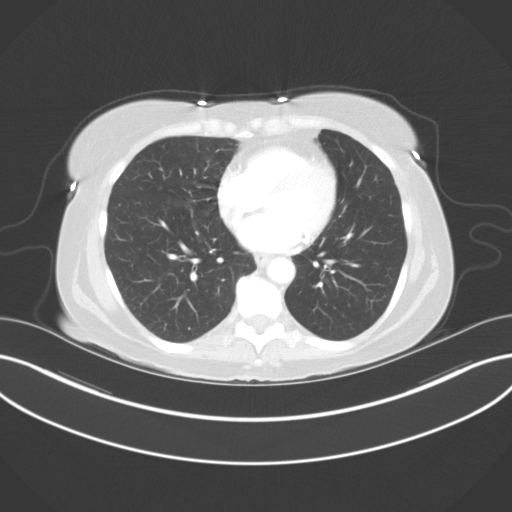
[im 103/129  lung]
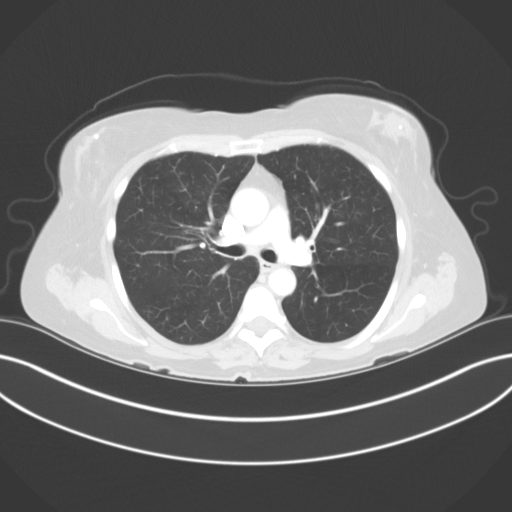
[im 116/129  mediastinal]
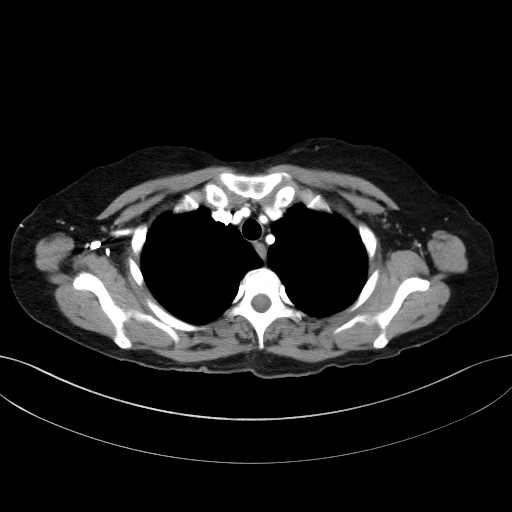
[im 116/129  lung]
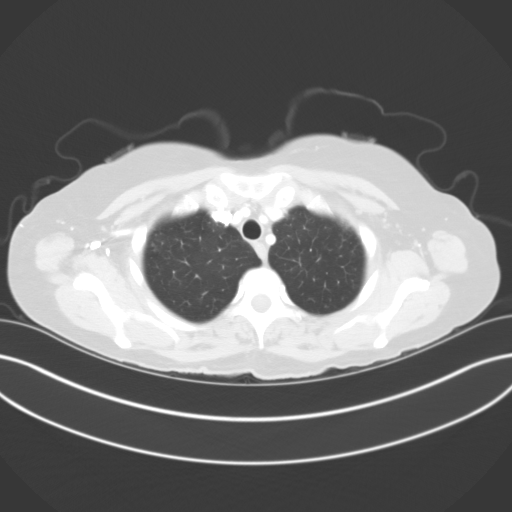

[Series 4: coronals · coronal · 0.77mm/px · 3 of 129 slices shown]
[im 26/129  lung]
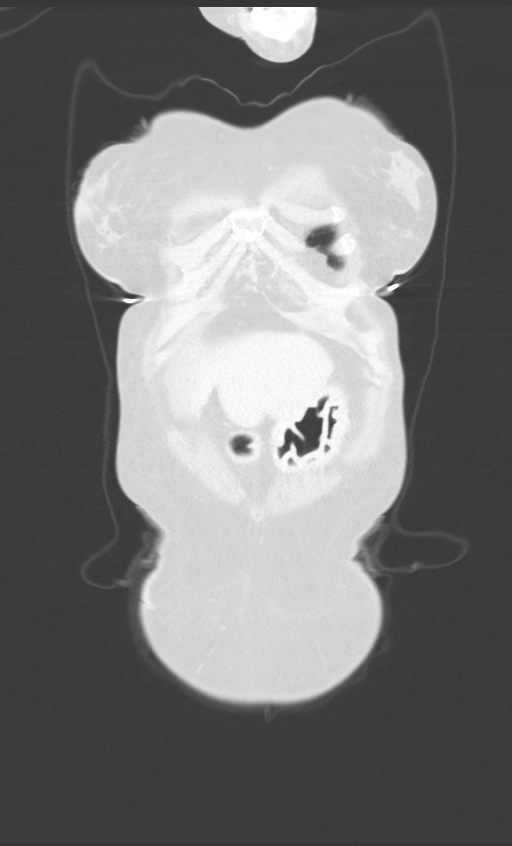
[im 52/129  lung]
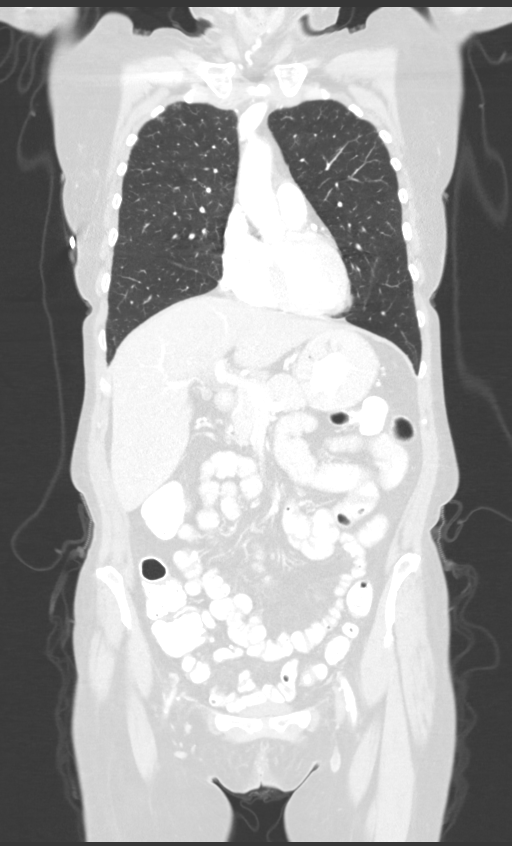
[im 77/129  lung]
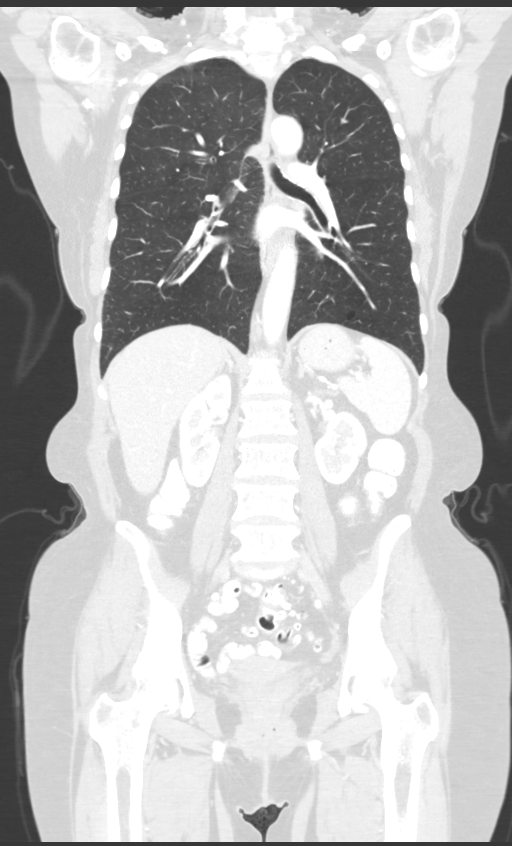

[12 of 36 positions shown; findings below may reference images not displayed]

FINDINGS: CT CHEST FINDINGS

Cardiovascular: Coronary, aortic arch, and branch vessel
atherosclerotic vascular disease.

Mediastinum/Nodes: Right hilar lymph node 0.7 cm in short axis on
image [DATE]. No pathologic thoracic adenopathy.

Lungs/Pleura: Tree-in-bud reticulonodular opacities in both upper
lobes, slightly increased from 10/07/2014, and most commonly
associated with atypical infectious bronchiolitis although given the
component of chronicity, some of this may simply represent chronic
postinflammatory findings. No overt airspace opacity or other
significantly progressive intrapulmonary finding.

Musculoskeletal: Unremarkable

CT ABDOMEN PELVIS FINDINGS

Hepatobiliary: Unremarkable no discrete hepatic contour abnormality
or discrete hepatic mass.

Pancreas: Unremarkable

Spleen: Unremarkable

Adrenals/Urinary Tract: Unremarkable

Stomach/Bowel: Diffuse wall thickening in the proximal stomach
including the cardia, fundus, and proximal body, some of this may be
from nondistention but the appearance is potentially abnormal for
example on image 56/2. No appreciable abnormality in this vicinity
on the prior CT from 03/01/2015.

Appendix normal. Sigmoid colon diverticulosis. No active
diverticulitis.

Vascular/Lymphatic: Aortoiliac atherosclerotic vascular disease.
Atheromatous narrowing of the right common iliac artery. No
pathologic adenopathy observed.

Reproductive: Unremarkable

Other: No supplemental non-categorized findings.

Musculoskeletal: Lower lumbar degenerative disc disease and
degenerative facet arthropathy. No residual findings related to the
patient's right remote prior rectus sheath hematoma.
IMPRESSION: 1. No specific hepatic abnormality is currently identified.
2. There is some wall thickening in the proximal stomach which may
partially be from nondistention, but I cannot exclude a proximal
gastritis.
3. Other imaging findings of potential clinical significance: Aortic
Atherosclerosis (OCYWF-ZO0.0). Coronary atherosclerosis.
Reticulonodular postinflammatory findings in the upper lobes,
minimally increased from 1187. Sigmoid diverticulosis.

## 2019-02-10 ENCOUNTER — Other Ambulatory Visit: Payer: Self-pay | Admitting: Interventional Cardiology

## 2019-03-04 ENCOUNTER — Ambulatory Visit: Payer: PPO | Admitting: Hematology & Oncology

## 2019-03-04 ENCOUNTER — Other Ambulatory Visit: Payer: PPO

## 2019-03-13 ENCOUNTER — Other Ambulatory Visit: Payer: Self-pay | Admitting: Interventional Cardiology

## 2019-03-14 ENCOUNTER — Other Ambulatory Visit: Payer: Self-pay | Admitting: Interventional Cardiology

## 2019-03-14 MED ORDER — METOPROLOL SUCCINATE ER 100 MG PO TB24: ORAL_TABLET | ORAL | 1 refills | Status: DC

## 2019-03-14 NOTE — Telephone Encounter (Signed)
Pt's medication was sent to pt's pharmacy as requested. Confirmation received.  °

## 2019-03-18 ENCOUNTER — Ambulatory Visit: Payer: PPO

## 2019-03-24 DIAGNOSIS — I10 Essential (primary) hypertension: Secondary | ICD-10-CM | POA: Diagnosis not present

## 2019-03-24 DIAGNOSIS — D45 Polycythemia vera: Secondary | ICD-10-CM | POA: Diagnosis not present

## 2019-03-24 DIAGNOSIS — J449 Chronic obstructive pulmonary disease, unspecified: Secondary | ICD-10-CM | POA: Diagnosis not present

## 2019-03-24 DIAGNOSIS — F419 Anxiety disorder, unspecified: Secondary | ICD-10-CM | POA: Diagnosis not present

## 2019-03-24 DIAGNOSIS — I779 Disorder of arteries and arterioles, unspecified: Secondary | ICD-10-CM | POA: Diagnosis not present

## 2019-03-24 DIAGNOSIS — E782 Mixed hyperlipidemia: Secondary | ICD-10-CM | POA: Diagnosis not present

## 2019-03-24 DIAGNOSIS — I2510 Atherosclerotic heart disease of native coronary artery without angina pectoris: Secondary | ICD-10-CM | POA: Diagnosis not present

## 2019-03-24 DIAGNOSIS — I700 Atherosclerosis of aorta: Secondary | ICD-10-CM | POA: Diagnosis not present

## 2019-03-24 DIAGNOSIS — R590 Localized enlarged lymph nodes: Secondary | ICD-10-CM | POA: Diagnosis not present

## 2019-03-24 DIAGNOSIS — Z0000 Encounter for general adult medical examination without abnormal findings: Secondary | ICD-10-CM | POA: Diagnosis not present

## 2019-03-24 DIAGNOSIS — Z1389 Encounter for screening for other disorder: Secondary | ICD-10-CM | POA: Diagnosis not present

## 2019-03-24 DIAGNOSIS — F17210 Nicotine dependence, cigarettes, uncomplicated: Secondary | ICD-10-CM | POA: Diagnosis not present

## 2019-03-24 DIAGNOSIS — F325 Major depressive disorder, single episode, in full remission: Secondary | ICD-10-CM | POA: Diagnosis not present

## 2019-03-24 DIAGNOSIS — R7309 Other abnormal glucose: Secondary | ICD-10-CM | POA: Diagnosis not present

## 2019-03-27 ENCOUNTER — Other Ambulatory Visit: Payer: Self-pay | Admitting: Internal Medicine

## 2019-03-27 ENCOUNTER — Ambulatory Visit: Payer: HMO | Attending: Internal Medicine

## 2019-03-27 DIAGNOSIS — Z23 Encounter for immunization: Secondary | ICD-10-CM | POA: Insufficient documentation

## 2019-03-27 DIAGNOSIS — R590 Localized enlarged lymph nodes: Secondary | ICD-10-CM

## 2019-03-27 NOTE — Progress Notes (Signed)
   Covid-19 Vaccination Clinic  Name:  [name]    MRN: [id number] DOB: 1950-10-27  03/27/2019  [name] was observed post Covid-19 immunization for 15 minutes without incidence. She was provided with Vaccine Information Sheet and instruction to access the V-Safe system.   [name] was instructed to call 911 with any severe reactions post vaccine: Marland Kitchen Difficulty breathing  . Swelling of your face and throat  . A fast heartbeat  . A bad rash all over your body  . Dizziness and weakness    Immunizations Administered    Name Date Dose VIS Date Route   Pfizer COVID-19 Vaccine 03/27/2019  5:07 PM 0.3 mL 01/31/2019 Intramuscular   Manufacturer: [employer]   Lot: [id number]   [place]: [id number]

## 2019-03-31 ENCOUNTER — Telehealth: Payer: Self-pay | Admitting: Interventional Cardiology

## 2019-03-31 NOTE — Telephone Encounter (Signed)
New Message   Pt c/o Syncope: STAT if syncope occurred within 30 minutes and pt complains of lightheadedness High Priority if episode of passing out, completely, today or in last 24 hours   1. Did you pass out today? No, yesterday   2. When is the last time you passed out? Yesterday   3. Has this occurred multiple times? No   4. Did you have any symptoms prior to passing out? Lightheadedness    Appointment scheduled with [name] on 04/28/19 2:40pm

## 2019-03-31 NOTE — Telephone Encounter (Signed)
Pt was at home last night and bent over to pick up a wash cloth.  Stood back up and was talking to her granddaughter and felt dizzy and suddenly slumped to the floor.  States she doesn't think she went all the way out but not sure.  Has had intermittent blurred vision for the last week.  BP fluctuating.  A week ago her BP was 87/57 and today it got up to 182/100, HR 59.  Pt feels fine right now.  Scheduled her to come in tomorrow to see Melina Copa, PA-C for evaluation.  Pt appreciative for call.

## 2019-04-01 ENCOUNTER — Encounter: Payer: Self-pay | Admitting: Physician Assistant

## 2019-04-01 ENCOUNTER — Other Ambulatory Visit: Payer: Self-pay

## 2019-04-01 ENCOUNTER — Ambulatory Visit (INDEPENDENT_AMBULATORY_CARE_PROVIDER_SITE_OTHER): Payer: HMO | Admitting: Physician Assistant

## 2019-04-01 VITALS — BP 154/73 | HR 66 | Ht 60.0 in | Wt 116.0 lb

## 2019-04-01 DIAGNOSIS — I2510 Atherosclerotic heart disease of native coronary artery without angina pectoris: Secondary | ICD-10-CM | POA: Diagnosis not present

## 2019-04-01 DIAGNOSIS — I10 Essential (primary) hypertension: Secondary | ICD-10-CM

## 2019-04-01 DIAGNOSIS — R002 Palpitations: Secondary | ICD-10-CM

## 2019-04-01 DIAGNOSIS — I6523 Occlusion and stenosis of bilateral carotid arteries: Secondary | ICD-10-CM | POA: Diagnosis not present

## 2019-04-01 DIAGNOSIS — R55 Syncope and collapse: Secondary | ICD-10-CM | POA: Diagnosis not present

## 2019-04-01 MED ORDER — ROSUVASTATIN CALCIUM 10 MG PO TABS: 10.0000 mg | ORAL_TABLET | ORAL | 3 refills | Status: DC

## 2019-04-01 NOTE — Patient Instructions (Addendum)
Medication Instructions:  Your physician recommends that you continue on your current medications as directed. Please refer to the Current Medication list given to you today.  *If you need a refill on your cardiac medications before your next appointment, please call your pharmacy*  Lab Work: TODAY:  BMET, MAG, & CBC  If you have labs (blood work) drawn today and your tests are completely normal, you will receive your results only by: Marland Kitchen MyChart Message (if you have MyChart) OR . A paper copy in the mail If you have any lab test that is abnormal or we need to change your treatment, we will call you to review the results.  Testing/Procedures: Your physician has recommended that you wear an event monitor. Event monitors are medical devices that record the heart's electrical activity. Doctors most often [place] these monitors to diagnose arrhythmias. Arrhythmias are problems with the speed or rhythm of the heartbeat. The monitor is a small, portable device. You can wear one while you do your normal daily activities. This is usually used to diagnose what is causing palpitations/syncope (passing out).  THIS WILL BE MAILED TO YOU   Your physician has requested that you have an echocardiogram 04/14/2019 ARRIVE AT 1:35 FOR REGISTRATION. Echocardiography is a painless test that uses sound waves to create images of your heart. It provides your doctor with information about the size and shape of your heart and how well your heart's chambers and valves are working. This procedure takes approximately one hour. There are no restrictions for this procedure.    Follow-Up: At [facility], you and your health needs are our priority.  As part of our continuing mission to provide you with exceptional heart care, we have created designated Provider Care Teams.  These Care Teams include your primary Cardiologist (physician) and Advanced Practice Providers (APPs -  Physician Assistants and Nurse Practitioners) who all  work together to provide you with the care you need, when you need it.  Your next appointment:   04/29/2019  The format for your next appointment:   In Person  Provider:   [name]   Other Instructions  Ambulatory Cardiac Monitoring An ambulatory cardiac monitor is a small recording device that is used to detect abnormal heart rhythms (arrhythmias). Most monitors are connected by wires to flat, sticky disks (electrodes) that are then attached to your chest. You may need to wear a monitor if you have had symptoms such as:  Fast heartbeats (palpitations).  Dizziness.  Fainting or light-headedness.  Unexplained weakness.  Shortness of breath. There are several types of monitors. Some common monitors include:  Holter monitor. This records your heart rhythm continuously, usually for 24-48 hours.  Event (episodic) monitor. This monitor has a symptoms button, and when pushed, it will begin recording. You need to activate this monitor to record when you have a heart-related symptom.  Automatic detection monitor. This monitor will begin recording when it detects an abnormal heartbeat. What are the risks? Generally, these devices are safe to use. However, it is possible that the skin under the electrodes will become irritated. How to prepare for monitoring Your health care provider will prepare your chest for the electrode placement and show you how to use the monitor.  Do not apply lotions to your chest before monitoring.  Follow directions on how to care for the monitor, and how to return the monitor when the testing period is complete. How to use your cardiac monitor  Follow directions about how long to wear  the monitor, and if you can take the monitor off in order to shower or bathe. ? Do not let the monitor get wet. ? Do not bathe, swim, or use a hot tub while wearing the monitor.  Keep your skin clean. Do not put body lotion or moisturizer on your chest.  Change the  electrodes as told by your health care provider, or any time they stop sticking to your skin. You may need to use medical tape to keep them on.  Try to put the electrodes in slightly different places on your chest to help prevent skin irritation. Follow directions from your health care provider about where to place the electrodes.  Make sure the monitor is safely clipped to your clothing or in a location close to your body as recommended by your health care provider.  If your monitor has a symptoms button, press the button to mark an event as soon as you feel a heart-related symptom, such as: ? Dizziness. ? Weakness. ? Light-headedness. ? Palpitations. ? Thumping or pounding in your chest. ? Shortness of breath. ? Unexplained weakness.  Keep a diary of your activities, such as walking, doing chores, and taking medicine. It is very important to note what you were doing when you pushed the button to record your symptoms. This will help your health care provider determine what might be contributing to your symptoms.  Send the recorded information as recommended by your health care provider. It may take some time for your health care provider to process the results.  Change the batteries as told by your health care provider.  Keep electronic devices away from your monitor. These include: ? Tablets. ? MP3 players. ? Cell phones.  While wearing your monitor you should avoid: ? Electric blankets. ? Armed forces operational officer. ? Electric toothbrushes. ? Microwave ovens. ? Magnets. ? Metal detectors. Get help right away if:  You have chest pain.  You have shortness of breath or extreme difficulty breathing.  You develop a very fast heartbeat that does not get better.  You develop dizziness that does not go away.  You faint or constantly feel like you are about to faint. Summary  An ambulatory cardiac monitor is a small recording device that is used to detect abnormal heart rhythms  (arrhythmias).  Make sure you understand how to send the information from the monitor to your health care provider.  It is important to press the button on the monitor when you have any heart-related symptoms.  Keep a diary of your activities, such as walking, doing chores, and taking medicine. It is very important to note what you were doing when you pushed the button to record your symptoms. This will help your health care provider learn what might be causing your symptoms. This information is not intended to replace advice given to you by your health care provider. Make sure you discuss any questions you have with your health care provider. Document Revised: 01/19/2017 Document Reviewed: 01/22/2016 Elsevier Patient Education  [place].    Echocardiogram An echocardiogram is a procedure that uses painless sound waves (ultrasound) to produce an image of the heart. Images from an echocardiogram can provide important information about:  Signs of coronary artery disease (CAD).  Aneurysm detection. An aneurysm is a weak or damaged part of an artery wall that bulges out from the normal force of blood pumping through the body.  Heart size and shape. Changes in the size or shape of the heart can be associated  with certain conditions, including heart failure, aneurysm, and CAD.  Heart muscle function.  Heart valve function.  Signs of a past heart attack.  Fluid buildup around the heart.  Thickening of the heart muscle.  A tumor or infectious growth around the heart valves. Tell a health care provider about:  Any allergies you have.  All medicines you are taking, including vitamins, herbs, eye drops, creams, and over-the-counter medicines.  Any blood disorders you have.  Any surgeries you have had.  Any medical conditions you have.  Whether you are pregnant or may be pregnant. What are the risks? Generally, this is a safe procedure. However, problems may occur,  including:  Allergic reaction to dye (contrast) that may be used during the procedure. What happens before the procedure? No specific preparation is needed. You may eat and drink normally. What happens during the procedure?   An IV tube may be inserted into one of your veins.  You may receive contrast through this tube. A contrast is an injection that improves the quality of the pictures from your heart.  A gel will be applied to your chest.  A wand-like tool (transducer) will be moved over your chest. The gel will help to transmit the sound waves from the transducer.  The sound waves will harmlessly bounce off of your heart to allow the heart images to be captured in real-time motion. The images will be recorded on a computer. The procedure may vary among health care providers and hospitals. What happens after the procedure?  You may return to your normal, everyday life, including diet, activities, and medicines, unless your health care provider tells you not to do that. Summary  An echocardiogram is a procedure that uses painless sound waves (ultrasound) to produce an image of the heart.  Images from an echocardiogram can provide important information about the size and shape of your heart, heart muscle function, heart valve function, and fluid buildup around your heart.  You do not need to do anything to prepare before this procedure. You may eat and drink normally.  After the echocardiogram is completed, you may return to your normal, everyday life, unless your health care provider tells you not to do that. This information is not intended to replace advice given to you by your health care provider. Make sure you discuss any questions you have with your health care provider. Document Revised: 05/30/2018 Document Reviewed: 03/11/2016 Elsevier Patient Education  [place].

## 2019-04-01 NOTE — Progress Notes (Signed)
Cardiology Office Note    Date:  04/01/2019   ID:  Adlen, Alpert 12-31-1950, MRN [id number]  PCP:  Wenda Low, MD  Cardiologist:  Sinclair Grooms, MD  Electrophysiologist:  None   Chief Complaint: nearly passed out   History of Present Illness:   [name] is a 69 y.o. female with history of hypertension, CAD s/p Cx stent 2010 with residual disease as outlined below, carotid artery disease s/p L CEA 07/2016 and R CEA 2007 (1-39% by duplex 05/2017, followed by [facility]), polycythemia vera followed by hematology, hepatitis C, tobacco abuse, COPD, GERD, anxiety, and medication noncompliance who presents for evaluation of syncope. Per Dr. Thompson Caul last note, there have been issues with patient compliance with antihypertensive therapy and poor insight into underlying disease processes (see note). Last cath as outlined below in 2010. Last nuc in 10/2017 was low risk without ischemia. She brings in a copy of her labwork from recent physical 03/24/19 by PCP with A1C 5.8, TSH wnl, K 4.8, Cr 0.76, LFTs wnl, glucose 112, CBC wnl with Hgb 15.2, LDL 65.  She presents today for evaluation of pre-syncope. About a month ago she had an episode of feeling lightheaded and checked her BP. It was in the 80s/50s so she self-discontinued her amlodipine. Over the last few weeks she has noticed a funny feeling in her chest at times as though someone is tickling it with a feather, with some palpitations. These are not typically associated with any other symptoms. She has not had any angina but does report chronic DOE which has gradually worsened as she's gotten older. She has not had any acute abrupt changes. 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 On Sunday 2/7 she was in [place] and bent over to pick up a washcloth off the ground. When she stood up she felt a little swimmy headed. However, she got to talking to her granddaughter and then  she felt her legs become very weak. She fell to the floor. She did not pass out fully because she could hear her granddaughter screaming. She felt back to herself within a few seconds. No b/b incontinence or seizure activity. She did not have any chest pain or palpitations during the event. She did not check her BP or HR at time of event. She felt better so did not contact EMS. She called for an appointment today. She feels well today without complaint. Blood pressure is somewhat elevated. She also indicates she is having a CT scan in the next week to evaluate abnormal neck nodule found by her PCP.  Orthostatic VS were obtained as below. This did not replicate her symptoms.  BP- Lying Pulse- Lying BP- Sitting Pulse- Sitting BP- Standing at 0 minutes Pulse- Standing at 0 minutes BP Standing at 60minutes Pulse Standing at 3 minutes  04/01/19 1544 154/73 59 149/68 58 143/73 62 136/76 57    Past Medical History:  Diagnosis Date  . Anxiety   . Arthritis   . CAD (coronary artery disease)    a. s/p LCX stent 2010 with residual disease.  . Carotid artery disease ([place])   . COPD (chronic obstructive pulmonary disease) ([place])   . Depression   . GERD (gastroesophageal reflux disease)   . Hepatitis C   . Hypertension   . Noncompliance with medication regimen   . Polycythemia   . PVD (peripheral vascular disease) ([place])   . Seizures (  [place]) 1970   x1- pt. reports that there was never any causative agent      Past Surgical History:  Procedure Laterality Date  . 2 stints  Aug 26,2010   Dr. Tawnya Crook  . ABDOMINAL SURGERY  1970's   for excessive bleeding from loss of pregnancy, required blood transfusion post procedure   . CAROTID ENDARTERECTOMY  2007  . cartoid endartherectomy  2007  . CORONARY ANGIOPLASTY WITH STENT PLACEMENT  2010  . CORONARY ANGIOPLASTY WITH STENT PLACEMENT  10/09/2008  . ENDARTERECTOMY Left 07/24/2016   Procedure: ENDARTERECTOMY CAROTID;  Surgeon: [name], MD;  Location: [facility];   Service: Vascular;  Laterality: Left;  . FLEXIBLE SIGMOIDOSCOPY N/A 03/22/2017   Procedure: FLEXIBLE SIGMOIDOSCOPY;  Surgeon: Carol Ada, MD;  Location: [facility];  Service: Endoscopy;  Laterality: N/A;  . LYMPH NODE DISSECTION Left 1990's   benign   . ORIF ELBOW FRACTURE Right 04/26/2018   Procedure: OPEN REDUCTION INTERNAL FIXATION (ORIF) ELBOW/OLECRANON FRACTURE;  Surgeon: Shona Needles, MD;  Location: [facility];  Service: Orthopedics;  Laterality: Right;  . TUBAL LIGATION      Current Medications: Current Meds  Medication Sig  . acetaminophen (TYLENOL) 325 MG tablet Take 325-650 mg by mouth every 6 (six) hours as needed for mild pain or headache.  . ALPRAZolam (XANAX) 0.5 MG tablet Take 0.5 mg by mouth at bedtime.   . Ascorbic Acid (VITAMIN C PO) Take 2 tablets by mouth daily.  . Biotin 1000 MCG tablet Take 1,000 mcg by mouth daily.  Marland Kitchen BREO ELLIPTA 100-25 MCG/INH AEPB INHALE ONE PUFF ONCE A DAY  . clopidogrel (PLAVIX) 75 MG tablet Take 1 tablet by mouth once daily  . clotrimazole-betamethasone (LOTRISONE) cream Apply 1 application topically 2 (two) times daily as needed (to affected area).  . escitalopram (LEXAPRO) 10 MG tablet Take 10 mg by mouth daily.  . hydrochlorothiazide (HYDRODIURIL) 25 MG tablet Take 1 tablet (25 mg total) by mouth daily.  . lansoprazole (PREVACID) 30 MG capsule Take 30 mg by mouth daily as needed (for ulcer/acid reflex). Ulcer/ acid reflux  . metoprolol succinate (TOPROL-XL) 100 MG 24 hr tablet TAKE 1 & 1/2 (ONE & ONE-HALF) TABLETS BY MOUTH ONCE DAILY . Please make yearly appt with [name] for May for future refills. 1st attempt  . rosuvastatin (CRESTOR) 10 MG tablet every other day.   . telmisartan (MICARDIS) 20 MG tablet Take 1 tablet by mouth once daily  . vitamin C (ASCORBIC ACID) 500 MG tablet Take 500-1,000 mg by mouth daily.  . vitamin E (VITAMIN E) 400 UNIT capsule Take 400 Units by mouth daily.      Allergies:   Diphenhydramine, Sulfa  antibiotics, and Bactrim [sulfamethoxazole-trimethoprim]   Social History   Socioeconomic History  . Marital status: Single    Spouse name: Not on file  . Number of children: Not on file  . Years of education: Not on file  . Highest education level: Not on file  Occupational History  . Not on file  Tobacco Use  . Smoking status: Current Every Day Smoker    Packs/day: 1.00    Years: 48.00    Pack years: 48.00    Types: Cigarettes    Start date: 03/20/1968  . Smokeless tobacco: Never Used  Substance and Sexual Activity  . Alcohol use: Yes    Alcohol/week: 0.0 standard drinks    Comment: socially occasionally  . Drug use: No    Comment: has smoked cigarettes off and  on  . Sexual activity: Not Currently  Other Topics Concern  . Not on file  Social History Narrative   ** Merged History Encounter **       Tobacco use cigarettes: Current smoker Frequency 1 PPD Estimated Pack - years :30 Smoking : yes  No Tobacco exposure No alcohol No exercise Occupation : employed, works at home for [employer], Health visitor   n Boys 1 Girls 1   Social Determinants of Health   Financial Resource Strain:   . Difficulty of Paying Living Expenses: Not on file  Food Insecurity:   . Worried About Charity fundraiser in the Last Year: Not on file  . Ran Out of Food in the Last Year: Not on file  Transportation Needs:   . Lack of Transportation (Medical): Not on file  . Lack of Transportation (Non-Medical): Not on file  Physical Activity:   . Days of Exercise per Week: Not on file  . Minutes of Exercise per Session: Not on file  Stress:   . Feeling of Stress : Not on file  Social Connections:   . Frequency of Communication with Friends and Family: Not on file  . Frequency of Social Gatherings with Friends and Family: Not on file  . Attends Religious Services: Not on file  . Active Member of Clubs or Organizations: Not on file  . Attends Theatre manager Meetings: Not on file  . Marital Status: Not on file     Family History:  The patient's family history includes Heart disease in her father and mother.  ROS:   Please see the history of present illness.  All other systems are reviewed and otherwise negative.    EKGs/Labs/Other Studies Reviewed:    Studies reviewed are outlined and summarized above. Reports included below if pertinent.  NST 10/2017  Nuclear stress EF: 52%.  There was no ST segment deviation noted during stress.  This is a low risk study.  The left ventricular ejection fraction is mildly decreased (45-54%).   1. EF 52%.  Gating does not look ideal, would confirm EF with echo.  2. No evidence for ischemia or infarction on perfusion images.   Low risk study.   Cardiac Cath 2010 CARDIAC CATHETERIZATION   PROCEDURES:  Left heart catheterization with selective coronary  angiography, left ventricular angiography, and a sequential stent placed  in the left circumflex coronary artery (drug eluting).   TYPE AND SITE OF ENTRY:  Percutaneous right femoral artery with Angio-  Seal.   CATHETERS:  A 6-French JL4 left coronary catheter, 6-French right  coronary catheter, 6-French pigtail ventriculographic catheter, guide  catheter, a 6-French JL4 guide, Prowater guidewire, predilatation with a  2.5 x 20 mm Apex distally and a 2.5 x 12 mm Voyager proximally with  stents being a 2.5 x 23 mm XIENCE stent placed distally and a 2.5 x 12  mm XIENCE stent proximally.   MEDICATION GIVEN PRIOR TO PROCEDURE:  Valium 10 mg p.o.   MEDICATIONS GIVEN DURING THE PROCEDURE:  Versed 4 mg IV, fentanyl 25  mcg, Plavix, Angiomax, and Ancef.   COMMENTS:  The patient tolerated the procedure well.   HEMODYNAMIC DATA:  The aortic pressure was 133/66, LV was 129/6-12.  There was no aortic valve gradient noted on pullback.   ANGIOGRAPHIC DATA:  1. Left main coronary artery is normal.  2. The left circumflex has  a long segment of a diffuse stenosis with  an area in the mid section with a continuation branch and a small      obtuse marginal that does not have severe obstruction.  The most      significant focal stenosis was within the segmental area of disease      was 90%.  There was diffuse 60-80% stenosis throughout this vessel.      Distally, the left circumflex appeared to be relatively normal.  3. The left anterior descending was heavily calcified.  There was      diffuse moderate stenosis scattered throughout the left anterior      descending and diagonal systems but no greater than 30-40%      narrowing.  There were 2 diagonal vessels present.  4. Right coronary artery:  The right coronary artery is a dominant      vessel.  There is 60-70% stenosis in its segment before the crux,      but the stenotic area is associated with a relatively small distal      vessel.  There is a larger right ventricular branch that arises in      the right coronary artery prior to the stenotic segments.  While      this is moderately severe to severe, it does have a potential of      being flow limiting but is borderline.   Left ventricular angiogram was performed in the RAO position.  Overall,  cardiac size and silhouette were normal, global ejection fraction is  60%.  Regional wall motion is normal.   ANGIOPLASTY PROCEDURE:  We used a JL4 guide, the 6-French.  Prowater  guidewire was passed into the course of the left circumflex.  We  predilated it with a 2.5 x 20 mm Apex balloon in the more distal part of  the segments and then tried to pass a 23-mm stent directly beyond more  proximal stenosis without predilatation.  We were unable to pass the  stent.  We then returned and predilated the more proximal lesion with a  2.5 x 12 mm Voyager balloon.   We then returned and placed the 2.5 x 23 mm XIENCE (drug-eluting stent)  in the left circumflex just distal to the 2 largest branches.  This  stent was  inflated to 12 atmospheres with full inflation and no residual  stenosis.  We then returned with a 12 mm x 2.5 mm XIENCE stent and  placed it proximally.  We left approximately 3-5 mm between the stents  that would correspond to the area where both the branch in the AV groove  and a small marginal arise.  This area between the 2 stents was left  untreated.  We inflated the more proximal XIENCE to 12 atmospheres with  a nice inflation and a smooth contour.   The patient developed a chest pain with stent and balloon inflation.  This was different from the left arm discomfort and toothache that she  had been experiencing that lead to the catheterization.   OVERALL IMPRESSION:  1. Severe stenosis in the left circumflex system with successful stent      placement in sequential fashion.  2. There is residual moderately severe distal right coronary artery      stenosis.  3. Mild to moderate atherosclerosis in the left anterior descending.  4. Normal left ventricular function.   PLAN:  The patient will be managed medically and encouraged to modify  cardiovascular risk factors.  She will need to have followup stress  testing and consideration of treatment of the right coronary artery.     EKG:  EKG is ordered today, personally reviewed, demonstrating NSR 60bpm, no acute STT changes, TWI avL similar to prior, QTc wnl,   Recent Labs: 01/01/2019: ALT 10; BUN 11; Creatinine 0.71; Hemoglobin 14.2; Platelet Count 251; Potassium 5.0; Sodium 136  Recent Lipid Panel    Component Value Date/Time   CHOL 142 12/04/2017 1203   TRIG 95 12/04/2017 1203   HDL 62 12/04/2017 1203   CHOLHDL 2.3 12/04/2017 1203   CHOLHDL 5.1 (H) 09/16/2014 1322   VLDL 37 (H) 09/16/2014 1322   LDLCALC 61 12/04/2017 1203    PHYSICAL EXAM:    VS:  BP (!) 154/73   Pulse 66   Ht 5' (1.524 m)   Wt 116 lb (52.6 kg)   SpO2 96%   BMI 22.65 kg/m   BMI: Body mass index is 22.65 kg/m.  GEN: Well nourished, well  developed WF, in no acute distress HEENT: normocephalic, atraumatic Neck: no JVD, carotid bruits. She does have what feel like palpable lymph nodes bilaterally in the anterior cervical chain although are firmer than I would expect Cardiac: RRR; no murmurs, rubs, or gallops, no edema  Respiratory:  clear to auscultation bilaterally, normal work of breathing GI: soft, nontender, nondistended, + BS MS: no deformity or atrophy Skin: warm and dry, no rash Neuro:  Alert and Oriented x 3, Strength and sensation are intact, follows commands Psych: euthymic mood, full affect  Wt Readings from Last 3 Encounters:  04/01/19 116 lb (52.6 kg)  10/14/18 114 lb 12.8 oz (52.1 kg)  09/02/18 112 lb 12.8 oz (51.2 kg)     ASSESSMENT & PLAN:   1. Presyncope - etiology not entirely clear. Preceded by "swimmy headed" sensation, which she'd also had about a month ago in the setting of low blood pressure. She did not check her BP at time of this event. Her blood pressure is now somewhat elevated. She is also describing some intermittent palpitations although not explicitly correlated with the presyncopal event. She is not tachycardic, tachypneic or hypoxic and feels OK today. Will check BMET, CBC, Mg today (she had recent labs otherwise but this was prior to event). Will obtain 2D Echocardiogram and 30 day live event monitor. I advised her to abstain from driving if she feels unwell at any point in the future. Also gave strict precautions to contact EMS if any recurrent episodes. Plan close follow-up. 2. CAD - no recent anginal symptoms, but does report chronic DOE gradually worsening as she continues to age and smoke. Smoking cessation has been advised. Will await echocardiogram and monitor to dictate if any ischemic workup is necessary. Also of note she is having a CT of her neck to determine etiology of palpable nodules. 3. Essential HTN - BP moderately elevated but she also had recent issues with low BP and then  near-syncope. I would suggest we continue present regimen and follow while we await monitor results, being careful not to be too aggressive during this acute workup. Blood pressure management has been a challenge in general for the patient. Orthostatics were obtained with a drop in BP from [id number] to [id number] systolic but she did not become symptomatic with this. 4. Carotid artery disease - she reports she plans to reach out to vascular team to arrange follow-up and carotid duplex.  Disposition: F/u with [name] as scheduled 04/2019 - she would like to keep this appointment.   Medication Adjustments/Labs and  Tests Ordered: Current medicines are reviewed at length with the patient today.  Concerns regarding medicines are outlined above. Medication changes, Labs and Tests ordered today are summarized above and listed in the Patient Instructions accessible in Encounters.   Signed, Charlie Pitter, PA-C  04/01/2019 [facility] [address], [place], [place]  [address] Phone: [phone]; Fax: [phone]

## 2019-04-02 ENCOUNTER — Telehealth: Payer: Self-pay | Admitting: *Deleted

## 2019-04-02 LAB — CBC
Hematocrit: 41.7 % (ref 34.0–46.6)
Hemoglobin: 14.5 g/dL (ref 11.1–15.9)
MCH: 32.3 pg (ref 26.6–33.0)
MCHC: 34.8 g/dL (ref 31.5–35.7)
MCV: 93 fL (ref 79–97)
Platelets: 234 10*3/uL (ref 150–450)
RBC: 4.49 x10E6/uL (ref 3.77–5.28)
RDW: 12.4 % (ref 11.7–15.4)
WBC: 5.7 10*3/uL (ref 3.4–10.8)

## 2019-04-02 LAB — BASIC METABOLIC PANEL
BUN/Creatinine Ratio: 20 (ref 12–28)
BUN: 14 mg/dL (ref 8–27)
CO2: 25 mmol/L (ref 20–29)
Calcium: 9.2 mg/dL (ref 8.7–10.3)
Chloride: 96 mmol/L (ref 96–106)
Creatinine, Ser: 0.7 mg/dL (ref 0.57–1.00)
GFR calc Af Amer: 103 mL/min/{1.73_m2} (ref >59)
GFR calc non Af Amer: 89 mL/min/{1.73_m2} (ref >59)
Glucose: 96 mg/dL (ref 65–99)
Potassium: 4.6 mmol/L (ref 3.5–5.2)
Sodium: 137 mmol/L (ref 134–144)

## 2019-04-02 LAB — MAGNESIUM: Magnesium: 2 mg/dL (ref 1.6–2.3)

## 2019-04-02 NOTE — Telephone Encounter (Signed)
Patient enrolled for [employer] to ship a 30 day cardiac event monitor to her home. 

## 2019-04-02 NOTE — Addendum Note (Signed)
Addended by: De Burrs on: 04/02/2019 05:15 PM   Modules accepted: Orders

## 2019-04-04 ENCOUNTER — Ambulatory Visit: Payer: PPO

## 2019-04-04 ENCOUNTER — Ambulatory Visit
Admission: RE | Admit: 2019-04-04 | Discharge: 2019-04-04 | Disposition: A | Discharge status: Home / Self Care | Payer: HMO | Source: Ambulatory Visit | Attending: Internal Medicine | Admitting: Internal Medicine

## 2019-04-04 DIAGNOSIS — R599 Enlarged lymph nodes, unspecified: Secondary | ICD-10-CM | POA: Diagnosis not present

## 2019-04-04 DIAGNOSIS — R590 Localized enlarged lymph nodes: Secondary | ICD-10-CM

## 2019-04-04 MED ORDER — IOPAMIDOL (ISOVUE-300) INJECTION 61%
75.0000 mL | Freq: Once | INTRAVENOUS | Status: AC | PRN
  Administered 2019-04-04: 16:00:00 75 mL via INTRAVENOUS

## 2019-04-08 ENCOUNTER — Other Ambulatory Visit (HOSPITAL_COMMUNITY): Payer: HMO

## 2019-04-09 ENCOUNTER — Inpatient Hospital Stay (HOSPITAL_BASED_OUTPATIENT_CLINIC_OR_DEPARTMENT_OTHER): Payer: HMO | Admitting: Hematology & Oncology

## 2019-04-09 ENCOUNTER — Other Ambulatory Visit: Payer: Self-pay

## 2019-04-09 ENCOUNTER — Inpatient Hospital Stay: Payer: HMO | Attending: Hematology & Oncology

## 2019-04-09 ENCOUNTER — Encounter: Payer: Self-pay | Admitting: Hematology & Oncology

## 2019-04-09 VITALS — BP 185/87 | HR 56 | Temp 97.5°F | Resp 18 | Wt 118.0 lb

## 2019-04-09 DIAGNOSIS — D751 Secondary polycythemia: Secondary | ICD-10-CM | POA: Diagnosis not present

## 2019-04-09 DIAGNOSIS — Z720 Tobacco use: Secondary | ICD-10-CM | POA: Diagnosis not present

## 2019-04-09 DIAGNOSIS — D500 Iron deficiency anemia secondary to blood loss (chronic): Secondary | ICD-10-CM

## 2019-04-09 DIAGNOSIS — R590 Localized enlarged lymph nodes: Secondary | ICD-10-CM | POA: Insufficient documentation

## 2019-04-09 DIAGNOSIS — Z79899 Other long term (current) drug therapy: Secondary | ICD-10-CM | POA: Diagnosis not present

## 2019-04-09 DIAGNOSIS — Z87891 Personal history of nicotine dependence: Secondary | ICD-10-CM | POA: Insufficient documentation

## 2019-04-09 DIAGNOSIS — B1920 Unspecified viral hepatitis C without hepatic coma: Secondary | ICD-10-CM | POA: Insufficient documentation

## 2019-04-09 LAB — CBC WITH DIFFERENTIAL (CANCER CENTER ONLY)
Abs Immature Granulocytes: 0.02 10*3/uL (ref 0.00–0.07)
Basophils Absolute: 0 10*3/uL (ref 0.0–0.1)
Basophils Relative: 1 %
Eosinophils Absolute: 0.1 10*3/uL (ref 0.0–0.5)
Eosinophils Relative: 1 %
HCT: 43.5 % (ref 36.0–46.0)
Hemoglobin: 14.6 g/dL (ref 12.0–15.0)
Immature Granulocytes: 0 %
Lymphocytes Relative: 28 %
Lymphs Abs: 1.8 10*3/uL (ref 0.7–4.0)
MCH: 31.7 pg (ref 26.0–34.0)
MCHC: 33.6 g/dL (ref 30.0–36.0)
MCV: 94.6 fL (ref 80.0–100.0)
Monocytes Absolute: 0.7 10*3/uL (ref 0.1–1.0)
Monocytes Relative: 11 %
Neutro Abs: 3.9 10*3/uL (ref 1.7–7.7)
Neutrophils Relative %: 59 %
Platelet Count: 271 10*3/uL (ref 150–400)
RBC: 4.6 MIL/uL (ref 3.87–5.11)
RDW: 11.9 % (ref 11.5–15.5)
WBC Count: 6.5 10*3/uL (ref 4.0–10.5)
nRBC: 0 % (ref 0.0–0.2)

## 2019-04-09 LAB — CMP (CANCER CENTER ONLY)
ALT: 14 U/L (ref 0–44)
AST: 20 U/L (ref 15–41)
Albumin: 4.5 g/dL (ref 3.5–5.0)
Alkaline Phosphatase: 75 U/L (ref 38–126)
Anion gap: 7 (ref 5–15)
BUN: 14 mg/dL (ref 8–23)
CO2: 34 mmol/L — ABNORMAL HIGH (ref 22–32)
Calcium: 10.2 mg/dL (ref 8.9–10.3)
Chloride: 94 mmol/L — ABNORMAL LOW (ref 98–111)
Creatinine: 0.74 mg/dL (ref 0.44–1.00)
GFR, Est AFR Am: >60 mL/min (ref >60)
GFR, Estimated: >60 mL/min (ref >60)
Glucose, Bld: 112 mg/dL — ABNORMAL HIGH (ref 70–99)
Potassium: 5.5 mmol/L — ABNORMAL HIGH (ref 3.5–5.1)
Sodium: 135 mmol/L (ref 135–145)
Total Bilirubin: 0.5 mg/dL (ref 0.3–1.2)
Total Protein: 6.7 g/dL (ref 6.5–8.1)

## 2019-04-09 NOTE — Progress Notes (Signed)
Hematology and Oncology Follow Up Visit  [name] [id number] 11-Feb-1951 69 y.o. 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 is here today for a follow-up.  Recently, she found a enlarged lymph node on the right side of her neck.  It has been there for a few weeks.  She went and had a CT scan done.  The CT scan really did not show any obvious lymphadenopathy.  She has had carotid endarterectomy bilaterally.  Is possible that this enlargement might be some scar tissue.  I think given her history of heavy tobacco use, we probably need to get a PET scan on her.  If the PET scan shows any positive activity in the right neck, then we will have to see about getting a biopsy.  She also has a lesion on the midline of her tongue.  It looks like this is where there is been some erosion of taste buds.  It is in the midline of the tongue.  It probably measures about 12 mm x 2 mm.  She has had no fever.  She has had no bleeding.  She has had no nausea or vomiting.  She still does smoke.    Overall, her performance status is ECOG 1.   Medications:  Allergies as of 04/09/2019      Reactions   Diphenhydramine Hives   Sulfa Antibiotics Nausea And Vomiting   Bactrim [sulfamethoxazole-trimethoprim] Nausea And Vomiting      Medication List       Accurate as of April 09, 2019 12:05 PM. If you have any questions, ask your nurse or doctor.        acetaminophen 325 MG tablet Commonly known as: TYLENOL Take 325-650 mg by mouth every 6 (six) hours as needed for mild pain or headache.   ALPRAZolam 0.5 MG tablet Commonly known as: XANAX Take 0.5 mg by mouth at bedtime.   Biotin 1000 MCG tablet Take 1,000 mcg by mouth  daily.   Breo Ellipta 100-25 MCG/INH Aepb Generic drug: fluticasone furoate-vilanterol INHALE ONE PUFF ONCE A DAY   clopidogrel 75 MG tablet Commonly known as: PLAVIX Take 1 tablet by mouth once daily   clotrimazole-betamethasone cream Commonly known as: LOTRISONE Apply 1 application topically 2 (two) times daily as needed (to affected area).   escitalopram 10 MG tablet Commonly known as: LEXAPRO Take 10 mg by mouth daily.   hydrochlorothiazide 25 MG tablet Commonly known as: HYDRODIURIL Take 1 tablet (25 mg total) by mouth daily.   lansoprazole 30 MG capsule Commonly known as: PREVACID Take 30 mg by mouth daily as needed (for ulcer/acid reflex). Ulcer/ acid reflux   metoprolol succinate 100 MG 24 hr tablet Commonly known as: TOPROL-XL TAKE 1 & 1/2 (ONE & ONE-HALF) TABLETS BY MOUTH ONCE DAILY . Please make yearly appt with [name] for May for future refills. 1st attempt   ProAir HFA 108 (90 Base) MCG/ACT inhaler Generic drug: albuterol Inhale 2 puffs into the lungs every 6 (six) hours as needed for wheezing or shortness of breath.   rosuvastatin 10 MG tablet Commonly known as: CRESTOR Take 1 tablet (10 mg total) by mouth every other day.   telmisartan 20 MG tablet Commonly  known as: MICARDIS Take 1 tablet by mouth once daily   vitamin C 500 MG tablet Commonly known as: ASCORBIC ACID Take 500-1,000 mg by mouth daily.   VITAMIN C PO Take 2 tablets by mouth daily.   vitamin E 180 MG (400 UNITS) capsule Generic drug: vitamin E Take 400 Units by mouth daily.       Allergies:  Allergies  Allergen Reactions  . Diphenhydramine Hives  . Sulfa Antibiotics Nausea And Vomiting  . Bactrim [Sulfamethoxazole-Trimethoprim] Nausea And Vomiting    Past Medical History, Surgical history, Social history, and Family History were reviewed and updated.  Review of Systems: Review of Systems  Constitutional: Positive for malaise/fatigue.  HENT: Negative.   Eyes:  Negative.   Respiratory: Negative.   Cardiovascular: Negative.   Gastrointestinal: Positive for abdominal pain, diarrhea and nausea.  Genitourinary: Negative.   Musculoskeletal: Negative.   Skin: Negative.   Neurological: Negative.   Endo/Heme/Allergies: Negative.   Psychiatric/Behavioral: Negative.      Physical Exam:  weight is 118 lb (53.5 kg). Her temporal temperature is 97.5 F (36.4 C) (abnormal). Her blood pressure is 185/87 (abnormal) and her pulse is 56 (abnormal). Her respiration is 18 and oxygen saturation is 99%.   Wt Readings from Last 3 Encounters:  04/09/19 118 lb (53.5 kg)  04/01/19 116 lb (52.6 kg)  10/14/18 114 lb 12.8 oz (52.1 kg)    Physical Exam Vitals reviewed.  HENT:     Head: Normocephalic and atraumatic.  Eyes:     Pupils: Pupils are equal, round, and reactive to light.  Neck:     Comments: On the right side of her neck, there is a firmness.  This is in the midline of the right side of her neck.  It might be close to the carotid.  There is no pulsations.  It is nontender. Cardiovascular:     Rate and Rhythm: Normal rate and regular rhythm.     Heart sounds: Normal heart sounds.  Pulmonary:     Effort: Pulmonary effort is normal.     Breath sounds: Normal breath sounds.  Abdominal:     General: Bowel sounds are normal.     Palpations: Abdomen is soft.  Musculoskeletal:        General: No tenderness or deformity. Normal range of motion.     Cervical back: Normal range of motion.  Lymphadenopathy:     Cervical: No cervical adenopathy.  Skin:    General: Skin is warm and dry.     Findings: No erythema or rash.  Neurological:     Mental Status: She is alert and oriented to person, place, and time.  Psychiatric:        Behavior: Behavior normal.        Thought Content: Thought content normal.        Judgment: Judgment normal.     Lab Results  Component Value Date   WBC 6.5 04/09/2019   HGB 14.6 04/09/2019   HCT 43.5 04/09/2019   MCV  94.6 04/09/2019   PLT 271 04/09/2019   Lab Results  Component Value Date   FERRITIN 25 01/01/2019   IRON 75 01/01/2019   TIBC 415 01/01/2019   UIBC 340 01/01/2019   IRONPCTSAT 18 (L) 01/01/2019   Lab Results  Component Value Date   RETICCTPCT 1.2 08/03/2010   RBC 4.60 04/09/2019   RETICCTABS 61.3 08/03/2010   No results found for: KPAFRELGTCHN, LAMBDASER, KAPLAMBRATIO No results found for: IGGSERUM, IGA, IGMSERUM No results  found for: Odetta Pink, SPEI   Chemistry      Component Value Date/Time   NA 135 04/09/2019 1041   NA 137 04/01/2019 1649   NA 139 01/03/2017 1327   NA 136 12/28/2015 1311   K 5.5 (H) 04/09/2019 1041   K 3.7 01/03/2017 1327   K 4.9 12/28/2015 1311   CL 94 (L) 04/09/2019 1041   CL 94 (L) 01/03/2017 1327   CO2 34 (H) 04/09/2019 1041   CO2 31 01/03/2017 1327   CO2 28 12/28/2015 1311   BUN 14 04/09/2019 1041   BUN 14 04/01/2019 1649   BUN 12 01/03/2017 1327   BUN 10.4 12/28/2015 1311   CREATININE 0.74 04/09/2019 1041   CREATININE 1.0 01/03/2017 1327   CREATININE 0.9 12/28/2015 1311      Component Value Date/Time   CALCIUM 10.2 04/09/2019 1041   CALCIUM 8.8 01/03/2017 1327   CALCIUM 9.6 12/28/2015 1311   ALKPHOS 75 04/09/2019 1041   ALKPHOS 85 (H) 01/03/2017 1327   ALKPHOS 83 12/28/2015 1311   AST 20 04/09/2019 1041   AST 25 12/28/2015 1311   ALT 14 04/09/2019 1041   ALT 19 01/03/2017 1327   ALT 13 12/28/2015 1311   BILITOT 0.5 04/09/2019 1041   BILITOT 0.74 12/28/2015 1311     Impression and Plan: Ms. Pesola is a 69 yo white female with polycythemia.   I we will see about doing a PET scan on her.  I think this is very reasonable given her history of heavy tobacco use.  This certainly could be a presentation of throat cancer.  I know she is having no odynophagia or dysphagia.  There is no hoarseness.  She has no pain in her ear.  I would like to see her back in 6 weeks.  I would like  to follow-up with this issue.  I had to spend about 35 minutes with her today.  This whole lymph node issue was new and we had to address it.  [name], MD 2/17/202112:05 PM

## 2019-04-10 LAB — IRON AND TIBC
Iron: 90 ug/dL (ref 41–142)
Saturation Ratios: 21 % (ref 21–57)
TIBC: 429 ug/dL (ref 236–444)
UIBC: 339 ug/dL (ref 120–384)

## 2019-04-10 LAB — FERRITIN: Ferritin: 31 ng/mL (ref 11–307)

## 2019-04-14 ENCOUNTER — Other Ambulatory Visit: Payer: Self-pay

## 2019-04-14 ENCOUNTER — Ambulatory Visit (HOSPITAL_COMMUNITY): Payer: HMO | Attending: Cardiovascular Disease

## 2019-04-14 DIAGNOSIS — I6523 Occlusion and stenosis of bilateral carotid arteries: Secondary | ICD-10-CM | POA: Diagnosis not present

## 2019-04-14 DIAGNOSIS — I2510 Atherosclerotic heart disease of native coronary artery without angina pectoris: Secondary | ICD-10-CM | POA: Insufficient documentation

## 2019-04-14 DIAGNOSIS — I10 Essential (primary) hypertension: Secondary | ICD-10-CM | POA: Insufficient documentation

## 2019-04-14 DIAGNOSIS — R002 Palpitations: Secondary | ICD-10-CM | POA: Diagnosis not present

## 2019-04-14 DIAGNOSIS — R55 Syncope and collapse: Secondary | ICD-10-CM | POA: Diagnosis not present

## 2019-04-21 ENCOUNTER — Ambulatory Visit: Payer: HMO | Attending: Internal Medicine

## 2019-04-21 DIAGNOSIS — Z23 Encounter for immunization: Secondary | ICD-10-CM | POA: Insufficient documentation

## 2019-04-21 NOTE — Progress Notes (Signed)
   Covid-19 Vaccination Clinic  Name:  [name]    MRN: [id number] DOB: Jul 29, 1950  04/21/2019  [name] was observed post Covid-19 immunization for 15 minutes without incidence. She was provided with Vaccine Information Sheet and instruction to access the V-Safe system.   [name] was instructed to call 911 with any severe reactions post vaccine: Marland Kitchen Difficulty breathing  . Swelling of your face and throat  . A fast heartbeat  . A bad rash all over your body  . Dizziness and weakness    Immunizations Administered    Name Date Dose VIS Date Route   Pfizer COVID-19 Vaccine 04/21/2019  4:48 PM 0.3 mL 01/31/2019 Intramuscular   Manufacturer: [place]   Lot: [id number]   [place]: [id number]

## 2019-04-24 ENCOUNTER — Telehealth (HOSPITAL_COMMUNITY): Payer: Self-pay

## 2019-04-24 NOTE — Telephone Encounter (Signed)

## 2019-04-27 NOTE — Progress Notes (Signed)
Cardiology Office Note:    Date:  04/28/2019   ID:  [name], DOB 15-Dec-1950, MRN [id number]  PCP:  Wenda Low, MD  Cardiologist:  Sinclair Grooms, MD   Referring MD: No ref. provider found   Chief Complaint  Patient presents with  . Advice Only    Syncope  . Hyperlipidemia  . Hypertension    History of Present Illness:    [name] is a 69 y.o. female with a hx of hypertension, CADs/p stent 2010, left carotid endarterectomy 07/2016, right carotid endarterectomy 2007,polycythemia vera, tobacco abuse,COPD, GERD and anxiety.  Polycythemia vera and hepatitis C followed by Dr. Marin Olp, hematologywith intermittent phlebotomy. Recent near syncope.  An episode of syncope occurred in early February, spontaneously while standing in the hallway at home.  She bent over to pick up a clothing item and upon straightening up, without warning became very weak and went down.  She does not feel she was out completely but was so weak she could not maintain her balance.  A granddaughter and other family members heard her go down and quickly got to her.  They found her awake and lucid.  She has had no recurrence since then.  She has had no prior episode.  An echocardiogram has been performed and was unremarkable.  She is wearing a 30-day monitor.  A carotid Doppler is being done tomorrow.   Past Medical History:  Diagnosis Date  . Anxiety   . Arthritis   . CAD (coronary artery disease)    a. s/p LCX stent 2010 with residual disease.  . Carotid artery disease ([place])   . COPD (chronic obstructive pulmonary disease) ([place])   . Depression   . GERD (gastroesophageal reflux disease)   . Hepatitis C   . Hypertension   . Noncompliance with medication regimen   . Polycythemia   . PVD (peripheral vascular disease) ([place])   . Seizures ([place]) 1970   x1- pt. reports that there was never any causative agent      Past Surgical History:  Procedure Laterality Date  . 2  stints  Aug 26,2010   Dr. Tawnya Crook  . ABDOMINAL SURGERY  1970's   for excessive bleeding from loss of pregnancy, required blood transfusion post procedure   . CAROTID ENDARTERECTOMY  2007  . cartoid endartherectomy  2007  . CORONARY ANGIOPLASTY WITH STENT PLACEMENT  2010  . CORONARY ANGIOPLASTY WITH STENT PLACEMENT  10/09/2008  . ENDARTERECTOMY Left 07/24/2016   Procedure: ENDARTERECTOMY CAROTID;  Surgeon: [name], MD;  Location: [facility];  Service: Vascular;  Laterality: Left;  . FLEXIBLE SIGMOIDOSCOPY N/A 03/22/2017   Procedure: FLEXIBLE SIGMOIDOSCOPY;  Surgeon: Carol Ada, MD;  Location: [facility];  Service: Endoscopy;  Laterality: N/A;  . LYMPH NODE DISSECTION Left 1990's   benign   . ORIF ELBOW FRACTURE Right 04/26/2018   Procedure: OPEN REDUCTION INTERNAL FIXATION (ORIF) ELBOW/OLECRANON FRACTURE;  Surgeon: Shona Needles, MD;  Location: [facility];  Service: Orthopedics;  Laterality: Right;  . TUBAL LIGATION      Current Medications: Current Meds  Medication Sig  . acetaminophen (TYLENOL) 325 MG tablet Take 325-650 mg by mouth every 6 (six) hours as needed for mild pain or headache.  . albuterol (PROAIR HFA) 108 (90 Base) MCG/ACT inhaler Inhale 2 puffs into the lungs every 6 (six) hours as needed for wheezing or shortness of breath.  . ALPRAZolam (XANAX) 0.5 MG tablet Take 0.5 mg by mouth at bedtime.   Marland Kitchen  Ascorbic Acid (VITAMIN C PO) Take 2 tablets by mouth daily.  . Biotin 1000 MCG tablet Take 1,000 mcg by mouth daily.  Marland Kitchen BREO ELLIPTA 100-25 MCG/INH AEPB INHALE ONE PUFF ONCE A DAY  . clopidogrel (PLAVIX) 75 MG tablet Take 1 tablet by mouth once daily  . clotrimazole-betamethasone (LOTRISONE) cream Apply 1 application topically 2 (two) times daily as needed (to affected area).  . escitalopram (LEXAPRO) 10 MG tablet Take 10 mg by mouth daily.  . hydrochlorothiazide (HYDRODIURIL) 25 MG tablet Take 1 tablet (25 mg total) by mouth daily.  . lansoprazole (PREVACID) 30 MG capsule Take 30  mg by mouth daily as needed (for ulcer/acid reflex). Ulcer/ acid reflux  . metoprolol succinate (TOPROL-XL) 100 MG 24 hr tablet TAKE 1 & 1/2 (ONE & ONE-HALF) TABLETS BY MOUTH ONCE DAILY . Please make yearly appt with [name] for May for future refills. 1st attempt  . rosuvastatin (CRESTOR) 10 MG tablet Take 1 tablet (10 mg total) by mouth every other day.  . telmisartan (MICARDIS) 20 MG tablet Take 1 tablet by mouth once daily  . vitamin C (ASCORBIC ACID) 500 MG tablet Take 500-1,000 mg by mouth daily.  . vitamin E (VITAMIN E) 400 UNIT capsule Take 400 Units by mouth daily.     Allergies:   Diphenhydramine, Sulfa antibiotics, and Bactrim [sulfamethoxazole-trimethoprim]   Social History   Socioeconomic History  . Marital status: Single    Spouse name: Not on file  . Number of children: Not on file  . Years of education: Not on file  . Highest education level: Not on file  Occupational History  . Not on file  Tobacco Use  . Smoking status: Former Smoker    Packs/day: 1.00    Years: 48.00    Pack years: 48.00    Types: Cigarettes    Start date: 03/20/1968    Quit date: 04/16/2019    Years since quitting: 0.0  . Smokeless tobacco: Never Used  Substance and Sexual Activity  . Alcohol use: Yes    Alcohol/week: 0.0 standard drinks    Comment: socially occasionally  . Drug use: No    Comment: has smoked cigarettes off and on  . Sexual activity: Not Currently  Other Topics Concern  . Not on file  Social History Narrative   ** Merged History Encounter **       Tobacco use cigarettes: Current smoker Frequency 1 PPD Estimated Pack - years :30 Smoking : yes  No Tobacco exposure No alcohol No exercise Occupation : employed, works at home for [employer], Health visitor   n Boys 1 Girls 1   Social Determinants of Health   Financial Resource Strain:   . Difficulty of Paying Living Expenses: Not on file  Food Insecurity:   . Worried About  Charity fundraiser in the Last Year: Not on file  . Ran Out of Food in the Last Year: Not on file  Transportation Needs:   . Lack of Transportation (Medical): Not on file  . Lack of Transportation (Non-Medical): Not on file  Physical Activity:   . Days of Exercise per Week: Not on file  . Minutes of Exercise per Session: Not on file  Stress:   . Feeling of Stress : Not on file  Social Connections:   . Frequency of Communication with Friends and Family: Not on file  . Frequency of Social Gatherings with Friends and Family: Not on file  . Attends Religious  Services: Not on file  . Active Member of Clubs or Organizations: Not on file  . Attends Archivist Meetings: Not on file  . Marital Status: Not on file     Family History: The patient's family history includes Heart disease in her father and mother.  ROS:   Please see the history of present illness.    No chest pain, palpitations, prior syncope, orthopnea, PND, cough, or other complaints.  Stop taking amlodipine.  Blood pressure is not well controlled although she just took her morning doses of medication at around noon time.  All other systems reviewed and are negative.  EKGs/Labs/Other Studies Reviewed:    The following studies were reviewed today: 2D Doppler echocardiogram 04/15/2019: IMPRESSIONS    1. Left ventricular ejection fraction, by estimation, is 60 to 65%. The  left ventricle has normal function. The left ventricle has no regional  wall motion abnormalities. Left ventricular diastolic parameters are  consistent with Grade II diastolic  dysfunction (pseudonormalization).  2. Right ventricular systolic function is normal. The right ventricular  size is normal.  3. The mitral valve is normal in structure and function. Trivial mitral  valve regurgitation. No evidence of mitral stenosis.  4. The aortic valve is normal in structure and function. Aortic valve  regurgitation is not visualized. No aortic  stenosis is present.  5. The inferior vena cava is normal in size with greater than 50%  respiratory variability, suggesting right atrial pressure of 3 mmHg.   EKG:  EKG performed on 04/03/2019 revealed normal sinus rhythm with vertical axis and nonspecific ST abnormality.  Recent Labs: 04/01/2019: Magnesium 2.0 04/09/2019: ALT 14; BUN 14; Creatinine 0.74; Hemoglobin 14.6; Platelet Count 271; Potassium 5.5; Sodium 135  Recent Lipid Panel    Component Value Date/Time   CHOL 142 12/04/2017 1203   TRIG 95 12/04/2017 1203   HDL 62 12/04/2017 1203   CHOLHDL 2.3 12/04/2017 1203   CHOLHDL 5.1 (H) 09/16/2014 1322   VLDL 37 (H) 09/16/2014 1322   LDLCALC 61 12/04/2017 1203    Physical Exam:    VS:  BP (!) 162/68   Pulse 61   Ht 5' (1.524 m)   Wt 118 lb (53.5 kg)   SpO2 97%   BMI 23.05 kg/m     Wt Readings from Last 3 Encounters:  04/28/19 118 lb (53.5 kg)  04/09/19 118 lb (53.5 kg)  04/01/19 116 lb (52.6 kg)     GEN: Appears consistent with stated age. No acute distress HEENT: Normal NECK: No JVD.Marland Kitchen  Firmness and nonpulsatile region around the right carotid below the mandible.  This region was identified by Dr. Deforest Hoyles. LYMPHATICS: No lymphadenopathy CARDIAC:  RRR without murmur, gallop, or edema. VASCULAR:  Normal Pulses. No bruits. RESPIRATORY:  Clear to auscultation without rales, wheezing or rhonchi  ABDOMEN: Soft, non-tender, non-distended, No pulsatile mass, MUSCULOSKELETAL: No deformity  SKIN: Warm and dry NEUROLOGIC:  Alert and oriented x 3 PSYCHIATRIC:  Normal affect   ASSESSMENT:    1. Coronary artery disease involving native coronary artery of native heart with angina pectoris ([place])   2. Essential hypertension   3. Bilateral carotid artery stenosis   4. Hyperlipidemia LDL goal <70   5. Tobacco abuse   6. COPD exacerbation ([place])   7. Educated about COVID-19 virus infection    PLAN:    In order of problems listed above:  1. She is stable without angina.   Secondary prevention discussed.  She did stop smoking.  Duration  of cessation is now approximately 2 weeks.  Using Nicorette. 2. Blood pressure is elevated but difficult to assess today's pressure since she just took her medications slightly before coming into the office.  I reiterated that target for her age is less than or equal 130/80 mmHg. 3. She has a carotid Doppler set for tomorrow.  She will also be having a PET scan to rule out malignancy in the region of the right carotid. 4. LDL target less than 70.  Most recent was 65 February 21, 2019 5. Using Nicorette to assist with smoking cessation. 6. Currently stable without wheezing or significant complaint of shortness of breath.  Denies chronic cough. 7. The COVID-19 vaccine and the 3W's are encouraged. 8. Diastolic dysfunction was discussed in terms that the patient seem to appreciate.  This was only echo finding of significance although not necessarily clinically relevant relative to syncope.  Overall education and awareness concerning /secondary risk prevention was discussed in detail: LDL less than 70, hemoglobin A1c less than 7, blood pressure target less than 130/80 mmHg, >150 minutes of moderate aerobic activity per week, avoidance of smoking, weight control (via diet and exercise), and continued surveillance/management of/for obstructive sleep apnea.  We will see how the monitor turns out.  We will see how the carotid Doppler and CAT scan turn out.  Reinforced the importance of smoking cessation and congratulated her on her success to this point.  Medication Adjustments/Labs and Tests Ordered: Current medicines are reviewed at length with the patient today.  Concerns regarding medicines are outlined above.  No orders of the defined types were placed in this encounter.  No orders of the defined types were placed in this encounter.   Patient Instructions  Medication Instructions:  Your physician recommends that you continue on your  current medications as directed. Please refer to the Current Medication list given to you today.  *If you need a refill on your cardiac medications before your next appointment, please call your pharmacy*   Lab Work: None If you have labs (blood work) drawn today and your tests are completely normal, you will receive your results only by: Marland Kitchen MyChart Message (if you have MyChart) OR . A paper copy in the mail If you have any lab test that is abnormal or we need to change your treatment, we will call you to review the results.   Testing/Procedures: None   Follow-Up: At [facility], you and your health needs are our priority.  As part of our continuing mission to provide you with exceptional heart care, we have created designated Provider Care Teams.  These Care Teams include your primary Cardiologist (physician) and Advanced Practice Providers (APPs -  Physician Assistants and Nurse Practitioners) who all work together to provide you with the care you need, when you need it.  We recommend signing up for the patient portal called "MyChart".  Sign up information is provided on this After Visit Summary.  MyChart is used to connect with patients for Virtual Visits (Telemedicine).  Patients are able to view lab/test results, encounter notes, upcoming appointments, etc.  Non-urgent messages can be sent to your provider as well.   To learn more about what you can do with MyChart, go to [email or web address].    Your next appointment:   9 month(s)  The format for your next appointment:   In Person  Provider:   You may see Sinclair Grooms, MD or one of the following Advanced Practice Providers on your designated Care  Team:    Truitt Merle, NP  Cecilie Kicks, NP  [name], NP    Other Instructions      Signed, Sinclair Grooms, MD  04/28/2019 3:24 PM    [facility]

## 2019-04-28 ENCOUNTER — Ambulatory Visit (INDEPENDENT_AMBULATORY_CARE_PROVIDER_SITE_OTHER): Payer: HMO

## 2019-04-28 ENCOUNTER — Ambulatory Visit (INDEPENDENT_AMBULATORY_CARE_PROVIDER_SITE_OTHER): Payer: HMO | Admitting: Interventional Cardiology

## 2019-04-28 ENCOUNTER — Encounter: Payer: Self-pay | Admitting: Interventional Cardiology

## 2019-04-28 ENCOUNTER — Other Ambulatory Visit: Payer: Self-pay

## 2019-04-28 VITALS — BP 162/68 | HR 61 | Ht 60.0 in | Wt 118.0 lb

## 2019-04-28 DIAGNOSIS — I6523 Occlusion and stenosis of bilateral carotid arteries: Secondary | ICD-10-CM

## 2019-04-28 DIAGNOSIS — Z720 Tobacco use: Secondary | ICD-10-CM | POA: Diagnosis not present

## 2019-04-28 DIAGNOSIS — J441 Chronic obstructive pulmonary disease with (acute) exacerbation: Secondary | ICD-10-CM

## 2019-04-28 DIAGNOSIS — Z7189 Other specified counseling: Secondary | ICD-10-CM

## 2019-04-28 DIAGNOSIS — E785 Hyperlipidemia, unspecified: Secondary | ICD-10-CM

## 2019-04-28 DIAGNOSIS — I2510 Atherosclerotic heart disease of native coronary artery without angina pectoris: Secondary | ICD-10-CM

## 2019-04-28 DIAGNOSIS — R002 Palpitations: Secondary | ICD-10-CM

## 2019-04-28 DIAGNOSIS — I10 Essential (primary) hypertension: Secondary | ICD-10-CM

## 2019-04-28 DIAGNOSIS — I25119 Atherosclerotic heart disease of native coronary artery with unspecified angina pectoris: Secondary | ICD-10-CM

## 2019-04-28 DIAGNOSIS — R55 Syncope and collapse: Secondary | ICD-10-CM | POA: Diagnosis not present

## 2019-04-28 DIAGNOSIS — I5189 Other ill-defined heart diseases: Secondary | ICD-10-CM

## 2019-04-28 NOTE — Patient Instructions (Signed)
Medication Instructions:  Your physician recommends that you continue on your current medications as directed. Please refer to the Current Medication list given to you today.  *If you need a refill on your cardiac medications before your next appointment, please call your pharmacy*   Lab Work: None If you have labs (blood work) drawn today and your tests are completely normal, you will receive your results only by: . MyChart Message (if you have MyChart) OR . A paper copy in the mail If you have any lab test that is abnormal or we need to change your treatment, we will call you to review the results.   Testing/Procedures: None   Follow-Up: At [facility], you and your health needs are our priority.  As part of our continuing mission to provide you with exceptional heart care, we have created designated Provider Care Teams.  These Care Teams include your primary Cardiologist (physician) and Advanced Practice Providers (APPs -  Physician Assistants and Nurse Practitioners) who all work together to provide you with the care you need, when you need it.  We recommend signing up for the patient portal called "MyChart".  Sign up information is provided on this After Visit Summary.  MyChart is used to connect with patients for Virtual Visits (Telemedicine).  Patients are able to view lab/test results, encounter notes, upcoming appointments, etc.  Non-urgent messages can be sent to your provider as well.   To learn more about what you can do with MyChart, go to [email or web address].    Your next appointment:   9 month(s)  The format for your next appointment:   In Person  Provider:   You may see Henry W Smith III, MD or one of the following Advanced Practice Providers on your designated Care Team:    Lori Gerhardt, NP  Laura Ingold, NP  Jill McDaniel, NP    Other Instructions   

## 2019-04-29 ENCOUNTER — Other Ambulatory Visit: Payer: Self-pay

## 2019-04-29 ENCOUNTER — Encounter: Payer: Self-pay | Admitting: Physician Assistant

## 2019-04-29 ENCOUNTER — Ambulatory Visit (INDEPENDENT_AMBULATORY_CARE_PROVIDER_SITE_OTHER): Payer: HMO | Admitting: Physician Assistant

## 2019-04-29 ENCOUNTER — Ambulatory Visit (HOSPITAL_COMMUNITY)
Admission: RE | Admit: 2019-04-29 | Discharge: 2019-04-29 | Disposition: A | Discharge status: Home / Self Care | Payer: HMO | Source: Ambulatory Visit | Attending: Vascular Surgery | Admitting: Vascular Surgery

## 2019-04-29 VITALS — BP 153/70 | HR 58 | Temp 98.0°F | Resp 18 | Ht 61.0 in | Wt 117.8 lb

## 2019-04-29 DIAGNOSIS — R590 Localized enlarged lymph nodes: Secondary | ICD-10-CM | POA: Diagnosis not present

## 2019-04-29 DIAGNOSIS — Z9889 Other specified postprocedural states: Secondary | ICD-10-CM | POA: Diagnosis not present

## 2019-04-29 DIAGNOSIS — Z720 Tobacco use: Secondary | ICD-10-CM | POA: Diagnosis not present

## 2019-04-29 DIAGNOSIS — I2510 Atherosclerotic heart disease of native coronary artery without angina pectoris: Secondary | ICD-10-CM | POA: Diagnosis not present

## 2019-04-29 DIAGNOSIS — I700 Atherosclerosis of aorta: Secondary | ICD-10-CM | POA: Insufficient documentation

## 2019-04-29 DIAGNOSIS — I6523 Occlusion and stenosis of bilateral carotid arteries: Secondary | ICD-10-CM | POA: Diagnosis not present

## 2019-04-29 NOTE — Progress Notes (Signed)
Established Carotid Patient   History of Present Illness   [name] is a 69 y.o. (11-25-1950) female who presents to go over vascular studies related to carotid artery stenosis.  Surgical history significant for left carotid endarterectomy in June 2018 by Dr. Donnetta Hutching.  She also has history of right carotid endarterectomy in 2007.  She denies any diagnosis of TIA or CVA in the last year.  She also denies any strokelike symptoms including slurring speech, changes in vision, or one-sided weakness.  She is quit smoking over the past 2 weeks however has been smoking her entire life per patient.  She has polycythemia vera managed by Dr. Marin Olp.  She requires phlebotomy about every 8 weeks.  She is scheduled for a PET scan tomorrow by Dr. Marin Olp due to a fixed nodule in her right neck.   The patient's PMH, PSH, SH, and FamHx were reviewed and are unchanged from prior visit.  Current Outpatient Medications  Medication Sig Dispense Refill  . acetaminophen (TYLENOL) 325 MG tablet Take 325-650 mg by mouth every 6 (six) hours as needed for mild pain or headache.    . ALPRAZolam (XANAX) 0.5 MG tablet Take 0.5 mg by mouth at bedtime.     . Ascorbic Acid (VITAMIN C PO) Take 2 tablets by mouth daily.    Marland Kitchen BREO ELLIPTA 100-25 MCG/INH AEPB INHALE ONE PUFF ONCE A DAY    . clopidogrel (PLAVIX) 75 MG tablet Take 1 tablet by mouth once daily 90 tablet 1  . clotrimazole-betamethasone (LOTRISONE) cream Apply 1 application topically 2 (two) times daily as needed (to affected area).    . escitalopram (LEXAPRO) 10 MG tablet Take 10 mg by mouth daily.    . lansoprazole (PREVACID) 30 MG capsule Take 30 mg by mouth daily as needed (for ulcer/acid reflex). Ulcer/ acid reflux    . metoprolol succinate (TOPROL-XL) 100 MG 24 hr tablet TAKE 1 & 1/2 (ONE & ONE-HALF) TABLETS BY MOUTH ONCE DAILY . Please make yearly appt with [name] for May for future refills. 1st attempt 135 tablet 1  . rosuvastatin  (CRESTOR) 10 MG tablet Take 1 tablet (10 mg total) by mouth every other day. 90 tablet 3  . telmisartan (MICARDIS) 20 MG tablet Take 1 tablet by mouth once daily 90 tablet 1  . vitamin C (ASCORBIC ACID) 500 MG tablet Take 500-1,000 mg by mouth daily.    . vitamin E (VITAMIN E) 400 UNIT capsule Take 400 Units by mouth daily.    Marland Kitchen albuterol (PROAIR HFA) 108 (90 Base) MCG/ACT inhaler Inhale 2 puffs into the lungs every 6 (six) hours as needed for wheezing or shortness of breath.    . Biotin 1000 MCG tablet Take 1,000 mcg by mouth daily.    . hydrochlorothiazide (HYDRODIURIL) 25 MG tablet Take 1 tablet (25 mg total) by mouth daily. 90 tablet 3   No current facility-administered medications for this visit.    REVIEW OF SYSTEMS (negative unless checked):   Cardiac:  []  Chest pain or chest pressure? [x]  Shortness of breath upon activity? []  Shortness of breath when lying flat? []  Irregular heart rhythm?  Vascular:  []  Pain in calf, thigh, or hip brought on by walking? []  Pain in feet at night that wakes you up from your sleep? []  Blood clot in your veins? []  Leg swelling?  Pulmonary:  []  Oxygen at home? []  Productive cough? []  Wheezing?  Neurologic:  []  Sudden weakness in arms or legs? []  Sudden numbness  in arms or legs? []  Sudden onset of difficult speaking or slurred speech? []  Temporary loss of vision in one eye? []  Problems with dizziness?  Gastrointestinal:  []  Blood in stool? []  Vomited blood?  Genitourinary:  []  Burning when urinating? []  Blood in urine?  Psychiatric:  []  Major depression  Hematologic:  []  Bleeding problems? []  Problems with blood clotting?  Dermatologic:  []  Rashes or ulcers?  Constitutional:  []  Fever or chills?  Ear/Nose/Throat:  []  Change in hearing? []  Nose bleeds? []  Sore throat?  Musculoskeletal:  []  Back pain? []  Joint pain? []  Muscle pain?   Physical Examination   Vitals:   04/29/19 1442 04/29/19 1447  BP: (!) 162/67  (!) 153/70  Pulse: (!) 58   Resp: 18   Temp: 98 F (36.7 C)   TempSrc: Temporal   SpO2: 98%   Weight: 117 lb 12.8 oz (53.4 kg)   Height: 5\' 1"  (1.549 m)    Body mass index is 22.26 kg/m.  General:  WDWN in NAD; vital signs documented above Gait: Not observed HENT: WNL, normocephalic Pulmonary: normal non-labored breathing Cardiac: regular HR Abdomen: soft, NT, no masses Skin: without rashes Vascular Exam/Pulses:  Right Left  Radial 2+ (normal) 2+ (normal)   Extremities: without ischemic changes, without Gangrene , without cellulitis; without open wounds;  Musculoskeletal: no muscle wasting or atrophy; R neck fixed palpable prominence along incision from prior endarterectomy  Neurologic: A&O X 3;  No focal weakness or paresthesias are detected; CN grossly intact Psychiatric:  The pt has Normal affect.  Non-Invasive Vascular Imaging   B Carotid Duplex :   R ICA stenosis:  1-39%  R VA:  patent and antegrade  L ICA stenosis:  1-39%  L VA:  patent and antegrade   Medical Decision Making   [name] is a 69 y.o. female who presents for routine surveillance of carotid stenosis; s/p L CEA 2018, R CEA 2007   Bilateral ICA 1-39% stenosis by duplex  Palpable prominence in R neck likely scar tissue however agree with PET scan given smoking history  Recheck carotid duplex in 18 months  Dagoberto Ligas PA-C [facility] [place] Office: [phone]  Clinic MD: Early

## 2019-04-30 ENCOUNTER — Other Ambulatory Visit: Payer: Self-pay

## 2019-04-30 ENCOUNTER — Ambulatory Visit (HOSPITAL_COMMUNITY)
Admission: RE | Admit: 2019-04-30 | Discharge: 2019-04-30 | Disposition: A | Discharge status: Home / Self Care | Payer: HMO | Source: Ambulatory Visit | Attending: Hematology & Oncology | Admitting: Hematology & Oncology

## 2019-04-30 DIAGNOSIS — Z720 Tobacco use: Secondary | ICD-10-CM

## 2019-04-30 DIAGNOSIS — R590 Localized enlarged lymph nodes: Secondary | ICD-10-CM | POA: Diagnosis not present

## 2019-04-30 LAB — GLUCOSE, CAPILLARY: Glucose-Capillary: 108 mg/dL — ABNORMAL HIGH (ref 70–99)

## 2019-04-30 MED ORDER — FLUDEOXYGLUCOSE F - 18 (FDG) INJECTION
5.8000 | Freq: Once | INTRAVENOUS | Status: AC | PRN
  Administered 2019-04-30: 5.8 via INTRAVENOUS

## 2019-05-01 ENCOUNTER — Telehealth: Payer: Self-pay | Admitting: *Deleted

## 2019-05-01 ENCOUNTER — Other Ambulatory Visit: Payer: Self-pay | Admitting: *Deleted

## 2019-05-01 DIAGNOSIS — Z9889 Other specified postprocedural states: Secondary | ICD-10-CM

## 2019-05-01 NOTE — Telephone Encounter (Signed)
-----   Message from [name], MD sent at 04/30/2019  4:10 PM EST ----- Call - the PET scan is totally normal!!  No cancer in lymph nodes!!  Laurey Arrow

## 2019-05-01 NOTE — Telephone Encounter (Signed)
Patient notified per order of Dr. Marin Olp that "the PET scan is totally normal!!  No cancer in lymph nodes!!"  Pt appreciative of call and has no questions at this time.

## 2019-05-21 DIAGNOSIS — R3915 Urgency of urination: Secondary | ICD-10-CM | POA: Diagnosis not present

## 2019-05-21 DIAGNOSIS — Z01419 Encounter for gynecological examination (general) (routine) without abnormal findings: Secondary | ICD-10-CM | POA: Diagnosis not present

## 2019-05-21 DIAGNOSIS — Z124 Encounter for screening for malignant neoplasm of cervix: Secondary | ICD-10-CM | POA: Diagnosis not present

## 2019-05-21 DIAGNOSIS — Z1231 Encounter for screening mammogram for malignant neoplasm of breast: Secondary | ICD-10-CM | POA: Diagnosis not present

## 2019-05-22 ENCOUNTER — Encounter: Payer: Self-pay | Admitting: Hematology & Oncology

## 2019-05-22 ENCOUNTER — Inpatient Hospital Stay (HOSPITAL_BASED_OUTPATIENT_CLINIC_OR_DEPARTMENT_OTHER): Payer: HMO | Admitting: Hematology & Oncology

## 2019-05-22 ENCOUNTER — Other Ambulatory Visit: Payer: Self-pay

## 2019-05-22 ENCOUNTER — Inpatient Hospital Stay: Payer: HMO | Attending: Hematology & Oncology

## 2019-05-22 VITALS — BP 190/83 | HR 62 | Temp 96.9°F | Resp 18 | Wt 120.0 lb

## 2019-05-22 DIAGNOSIS — D751 Secondary polycythemia: Secondary | ICD-10-CM | POA: Insufficient documentation

## 2019-05-22 DIAGNOSIS — Z720 Tobacco use: Secondary | ICD-10-CM

## 2019-05-22 DIAGNOSIS — B1920 Unspecified viral hepatitis C without hepatic coma: Secondary | ICD-10-CM | POA: Insufficient documentation

## 2019-05-22 DIAGNOSIS — Z79899 Other long term (current) drug therapy: Secondary | ICD-10-CM | POA: Diagnosis not present

## 2019-05-22 DIAGNOSIS — D45 Polycythemia vera: Secondary | ICD-10-CM | POA: Diagnosis not present

## 2019-05-22 LAB — CMP (CANCER CENTER ONLY)
ALT: 13 U/L (ref 0–44)
AST: 20 U/L (ref 15–41)
Albumin: 4.3 g/dL (ref 3.5–5.0)
Alkaline Phosphatase: 65 U/L (ref 38–126)
Anion gap: 7 (ref 5–15)
BUN: 13 mg/dL (ref 8–23)
CO2: 32 mmol/L (ref 22–32)
Calcium: 9.5 mg/dL (ref 8.9–10.3)
Chloride: 89 mmol/L — ABNORMAL LOW (ref 98–111)
Creatinine: 0.7 mg/dL (ref 0.44–1.00)
GFR, Est AFR Am: >60 mL/min (ref >60)
GFR, Estimated: >60 mL/min (ref >60)
Glucose, Bld: 108 mg/dL — ABNORMAL HIGH (ref 70–99)
Potassium: 4.2 mmol/L (ref 3.5–5.1)
Sodium: 128 mmol/L — ABNORMAL LOW (ref 135–145)
Total Bilirubin: 0.7 mg/dL (ref 0.3–1.2)
Total Protein: 6.8 g/dL (ref 6.5–8.1)

## 2019-05-22 LAB — CBC WITH DIFFERENTIAL (CANCER CENTER ONLY)
Abs Immature Granulocytes: 0.03 10*3/uL (ref 0.00–0.07)
Basophils Absolute: 0 10*3/uL (ref 0.0–0.1)
Basophils Relative: 0 %
Eosinophils Absolute: 0.1 10*3/uL (ref 0.0–0.5)
Eosinophils Relative: 1 %
HCT: 39.7 % (ref 36.0–46.0)
Hemoglobin: 13.6 g/dL (ref 12.0–15.0)
Immature Granulocytes: 0 %
Lymphocytes Relative: 21 %
Lymphs Abs: 1.8 10*3/uL (ref 0.7–4.0)
MCH: 32.2 pg (ref 26.0–34.0)
MCHC: 34.3 g/dL (ref 30.0–36.0)
MCV: 93.9 fL (ref 80.0–100.0)
Monocytes Absolute: 0.9 10*3/uL (ref 0.1–1.0)
Monocytes Relative: 10 %
Neutro Abs: 5.7 10*3/uL (ref 1.7–7.7)
Neutrophils Relative %: 68 %
Platelet Count: 254 10*3/uL (ref 150–400)
RBC: 4.23 MIL/uL (ref 3.87–5.11)
RDW: 11.7 % (ref 11.5–15.5)
WBC Count: 8.5 10*3/uL (ref 4.0–10.5)
nRBC: 0 % (ref 0.0–0.2)

## 2019-05-22 LAB — SEDIMENTATION RATE: Sed Rate: 3 mm/hr (ref 0–22)

## 2019-05-22 NOTE — Progress Notes (Signed)
Hematology and Oncology Follow Up Visit  Katrisha Amuso [id number] 08-13-1950 69 y.o. 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 Houx is here today for a follow-up.  Overall, she is about the same.  She had stopped smoking for 15 days but then began to smoke again after her mom fell and this caused a lot of stress for her.  She has some kind of cardiac monitor on.  Her blood pressure is quite high.  I told her that cardiology is managing all of this.  We did get a PET scan on her.  There is some question of a enlarged lymph node on her right neck.  The PET scan was done on 04/30/2019.  The PET scan was negative for any activity in the neck.  She has had no problems with nausea or vomiting.  She is eating okay.  She is had no obvious change in bowel or bladder habits.  There is been no bleeding.  She has had no leg swelling.  She has had no rashes.  Overall, her performance status is ECOG 1.   Medications:  Allergies as of 05/22/2019      Reactions   Diphenhydramine Hives   Sulfa Antibiotics Nausea And Vomiting   Bactrim [sulfamethoxazole-trimethoprim] Nausea And Vomiting      Medication List       Accurate as of May 22, 2019  4:35 PM. If you have any questions, ask your nurse or doctor.        acetaminophen 325 MG tablet Commonly known as: TYLENOL Take 325-650 mg by mouth every 6 (six) hours as needed for mild pain or headache.   ALPRAZolam 0.5 MG tablet Commonly known as: XANAX Take 0.5 mg by mouth at bedtime.   Biotin 1000 MCG tablet Take 1,000 mcg by mouth daily.   Breo Ellipta 100-25 MCG/INH Aepb Generic drug: fluticasone furoate-vilanterol INHALE ONE PUFF ONCE A DAY   clopidogrel 75 MG tablet Commonly  known as: PLAVIX Take 1 tablet by mouth once daily   clotrimazole-betamethasone cream Commonly known as: LOTRISONE Apply 1 application topically 2 (two) times daily as needed (to affected area).   escitalopram 10 MG tablet Commonly known as: LEXAPRO Take 10 mg by mouth daily.   hydrochlorothiazide 25 MG tablet Commonly known as: HYDRODIURIL Take 1 tablet (25 mg total) by mouth daily.   lansoprazole 30 MG capsule Commonly known as: PREVACID Take 30 mg by mouth daily as needed (for ulcer/acid reflex). Ulcer/ acid reflux   metoprolol succinate 100 MG 24 hr tablet Commonly known as: TOPROL-XL TAKE 1 & 1/2 (ONE & ONE-HALF) TABLETS BY MOUTH ONCE DAILY . Please make yearly appt with [name] for May for future refills. 1st attempt   ProAir HFA 108 (90 Base) MCG/ACT inhaler Generic drug: albuterol Inhale 2 puffs into the lungs every 6 (six) hours as needed for wheezing or shortness of breath.   rosuvastatin 10 MG tablet Commonly known as: CRESTOR Take 1 tablet (10 mg total) by mouth every other day.   telmisartan 20 MG tablet Commonly known as: MICARDIS Take 1 tablet by mouth once daily   vitamin C 500 MG tablet Commonly known as: ASCORBIC ACID Take 500-1,000 mg by mouth  daily.   VITAMIN C PO Take 2 tablets by mouth daily.   vitamin E 180 MG (400 UNITS) capsule Generic drug: vitamin E Take 400 Units by mouth daily.       Allergies:  Allergies  Allergen Reactions  . Diphenhydramine Hives  . Sulfa Antibiotics Nausea And Vomiting  . Bactrim [Sulfamethoxazole-Trimethoprim] Nausea And Vomiting    Past Medical History, Surgical history, Social history, and Family History were reviewed and updated.  Review of Systems: Review of Systems  Constitutional: Positive for malaise/fatigue.  HENT: Negative.   Eyes: Negative.   Respiratory: Negative.   Cardiovascular: Negative.   Gastrointestinal: Positive for abdominal pain, diarrhea and nausea.  Genitourinary: Negative.     Musculoskeletal: Negative.   Skin: Negative.   Neurological: Negative.   Endo/Heme/Allergies: Negative.   Psychiatric/Behavioral: Negative.      Physical Exam:  weight is 120 lb (54.4 kg). Her temporal temperature is 96.9 F (36.1 C) (abnormal). Her blood pressure is 190/83 (abnormal) and her pulse is 62. Her respiration is 18 and oxygen saturation is 100%.   Wt Readings from Last 3 Encounters:  05/22/19 120 lb (54.4 kg)  04/29/19 117 lb 12.8 oz (53.4 kg)  04/28/19 118 lb (53.5 kg)    Physical Exam Vitals reviewed.  HENT:     Head: Normocephalic and atraumatic.  Eyes:     Pupils: Pupils are equal, round, and reactive to light.  Neck:     Comments: On the right side of her neck, there is a firmness.  This is in the midline of the right side of her neck.  It might be close to the carotid.  There is no pulsations.  It is nontender. Cardiovascular:     Rate and Rhythm: Normal rate and regular rhythm.     Heart sounds: Normal heart sounds.  Pulmonary:     Effort: Pulmonary effort is normal.     Breath sounds: Normal breath sounds.  Abdominal:     General: Bowel sounds are normal.     Palpations: Abdomen is soft.  Musculoskeletal:        General: No tenderness or deformity. Normal range of motion.     Cervical back: Normal range of motion.  Lymphadenopathy:     Cervical: No cervical adenopathy.  Skin:    General: Skin is warm and dry.     Findings: No erythema or rash.  Neurological:     Mental Status: She is alert and oriented to person, place, and time.  Psychiatric:        Behavior: Behavior normal.        Thought Content: Thought content normal.        Judgment: Judgment normal.     Lab Results  Component Value Date   WBC 8.5 05/22/2019   HGB 13.6 05/22/2019   HCT 39.7 05/22/2019   MCV 93.9 05/22/2019   PLT 254 05/22/2019   Lab Results  Component Value Date   FERRITIN 31 04/09/2019   IRON 90 04/09/2019   TIBC 429 04/09/2019   UIBC 339 04/09/2019    IRONPCTSAT 21 04/09/2019   Lab Results  Component Value Date   RETICCTPCT 1.2 08/03/2010   RBC 4.23 05/22/2019   RETICCTABS 61.3 08/03/2010   No results found for: KPAFRELGTCHN, LAMBDASER, KAPLAMBRATIO No results found for: IGGSERUM, IGA, IGMSERUM No results found for: TOTALPROTELP, ALBUMINELP, A1GS, A2GS, BETS, BETA2SER, GAMS, MSPIKE, SPEI   Chemistry      Component Value Date/Time   NA 128 (L) 05/22/2019  1433   NA 137 04/01/2019 1649   NA 139 01/03/2017 1327   NA 136 12/28/2015 1311   K 4.2 05/22/2019 1433   K 3.7 01/03/2017 1327   K 4.9 12/28/2015 1311   CL 89 (L) 05/22/2019 1433   CL 94 (L) 01/03/2017 1327   CO2 32 05/22/2019 1433   CO2 31 01/03/2017 1327   CO2 28 12/28/2015 1311   BUN 13 05/22/2019 1433   BUN 14 04/01/2019 1649   BUN 12 01/03/2017 1327   BUN 10.4 12/28/2015 1311   CREATININE 0.70 05/22/2019 1433   CREATININE 1.0 01/03/2017 1327   CREATININE 0.9 12/28/2015 1311      Component Value Date/Time   CALCIUM 9.5 05/22/2019 1433   CALCIUM 8.8 01/03/2017 1327   CALCIUM 9.6 12/28/2015 1311   ALKPHOS 65 05/22/2019 1433   ALKPHOS 85 (H) 01/03/2017 1327   ALKPHOS 83 12/28/2015 1311   AST 20 05/22/2019 1433   AST 25 12/28/2015 1311   ALT 13 05/22/2019 1433   ALT 19 01/03/2017 1327   ALT 13 12/28/2015 1311   BILITOT 0.7 05/22/2019 1433   BILITOT 0.74 12/28/2015 1311     Impression and Plan: Ms. Merklinger is a 69 yo white female with polycythemia.   I am happy that we do not have to do a phlebotomy on her.  She is really done quite nicely.  Hopefully, the blood pressure can get under better control.  I retook her blood pressure with a manual blood pressure cuff and the blood pressure was 160/80.  Hopefully, she will stop smoking again.  We will plan to get her back to see [place] in another 3 months.  [name], MD 4/1/20214:35 PM

## 2019-05-23 ENCOUNTER — Telehealth: Payer: Self-pay | Admitting: Hematology & Oncology

## 2019-05-23 LAB — LACTATE DEHYDROGENASE: LDH: 206 U/L — ABNORMAL HIGH (ref 98–192)

## 2019-05-23 NOTE — Telephone Encounter (Signed)
Called and advised patient of appointments added per 4/1 los

## 2019-06-09 ENCOUNTER — Other Ambulatory Visit: Payer: Self-pay | Admitting: General Practice

## 2019-06-09 NOTE — Patient Outreach (Signed)
Client is newly enrolled in the Special Needs Plan program with Type II Diabetes. No Health Risk Assessment on file. Individualized Care Plan (ICP) completed from information in the EMR. Client also has a history of Heart Disease, COPD, and Hypertension. Will send introductory letter with ICP to the primary provider and client, along with educational materials. No ED or acute care admissions in over a year. Assigned RN Care Coordinator will follow up in 3 months.

## 2019-07-17 ENCOUNTER — Encounter: Payer: Self-pay | Admitting: *Deleted

## 2019-07-17 ENCOUNTER — Other Ambulatory Visit: Payer: Self-pay | Admitting: *Deleted

## 2019-07-17 NOTE — Patient Outreach (Addendum)
[facility]) Care Management Chronic Special Needs Program  07/17/2019  Name: Tykira Houfek DOB: Jan 21, 1951  MRN: [id number]  Ms. Anorah Crosswhite is enrolled in a chronic special needs plan for Diabetes. Chronic Care Management Coordinator telephoned client to review health risk assessment and to develop individualized care plan.  Introduced the chronic care management program, importance of client participation, and taking their care plan to all provider appointments and inpatient facilities.  Reviewed the transition of care process and possible referral to community care management.  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 Client reports she has pre-diabetes and is on no medication and has not been instructed to check her blood sugar.  Client states she basically eats a regular diet but will try to be more mindful of her carbohydrate intake.  Client reports she wants to quit smoking and start exercising and interested in health coaching.  (RN care manager placed order via in basket).  Client states she is slightly hard of hearing in both ears and "heard that [employer]TA will pay for one hearing aide"  Client states she plans to call HTA conciegre for information, benefits for hearing aides and states if any further needs related to this she will contact RN care manager. Health Risk Assessment completed during this call.   Goals Addressed            This Visit's Progress   .  Acknowledge receipt of Building surveyor mailed client Advanced Directives packet. RN care manager mailed EMMI education article " Advanced directives"     . "I wanna quit smoking and start exercising" (pt-stated)       RN care manager mailed EMMI education article "Quitting smoking for older adults" RN care manager placed order for health coach and she will call  you for help with smoking cessation and helping you with an exercise plan.     . Client understands the importance of follow-up with providers by attending scheduled visits       Client is seeing primary care provider, cardiologist and oncologist regularly.    . Client will report no worsening of symptoms related to heart disease within the next 9-12 months       RN Provided Emmi Education on "Heart disease in diabetes" Lifestyle changes can help you prevent or slow the progression of heart disease. Control your blood pressure Have your cholesterol checked Avoid or limit alcohol Maintain a healthy weight Manage stress and get adequate sleep Eat heart healthy foods such as fresh fruits and vegetables, whole grains with fiber, lean proteins and low in salt Talk to your health care provider about an exercise program     . Client will verbalize knowledge of chronic lung disease as evidenced by no ED visits or Inpatient stays related to chronic lung disease        RN provided [facility] Education article "COPD- what you can do" Review education in the [employer] calender you received in the mail.  Review and understand the COPD action plan, Red, Yellow and Green zones of symptoms.  Reports no changes in COPD with no ED or hospital visits.  Use the 24 hour nurse advice line as needed at [phone]    . Client will verbalize knowledge of self management of Hypertension as evidences by BP reading of 140/90 or less; or  as defined by provider       Take blood pressure medications as prescribed. Plan to check blood pressure regularly and take results to your health care provider appointments. Plan to follow a low salt diet. Increase activity as tolerated. Follow up with your health care provider as recommended. Please ask your health care provider " what is my target blood pressure range". RN care manager mailed EMMI education article "High blood pressure (hypertension): What you can do"       . HEMOGLOBIN A1C < 7.0       Per medical record review Hgb AIC completed on 03/24/19   5.8 Diabetes self management actions as follows- Continue to keep your follow up appointments with your provider and have lab work completed as recommended. Monitor glucose (blood sugar) per health care provider recommendation. Check feet daily Visit health care provider every 3-6 months as directed. It is important to have your Hgb AIC checked every 3-6 months (every 6 months if you are at goal and every 3 months if you are not at goal). Eye exam yearly Carbohydrate controlled meal planning Take diabetes medications as prescribed by health care provider Physical activity     . Maintain timely refills of diabetic medication as prescribed within the year .   On track    Take medications as prescribed. Follow up with your health care provider if you have any questions. Please contact your assigned RN care manager if you have difficulty obtaining medications. Review of electronic medical record medication dispense report indicates client maintains timely refills of diabetic medications.     . Obtain annual  Lipid Profile, LDL-C   On track    Per medical record review, Lipid profile completed on 03/24/19 LDL= 65 The goal for LDL is less than 70mg /dl as you are at high risk for complications. Try to avoid saturated fats, trans-fats and eat more fiber. Continue to follow up with your health care provider for any needed lab work.     . Obtain Annual Foot Exam   On track    Client states provider checks feet 1 times per year. Per client report , foot exam completed on 03/24/19 Your doctor should check your feet at least once a year. Plan to schedule a foot exam with your health care provider once every year. Check your skin and feet daily for cuts, bruises, redness, blisters or  sore.                                                                     . Obtain annual screen for micro albuminuria (urine) , nephropathy (kidney problems)       Per medical record review, no indication of when microalbuminuria completed  Continue to obtain yearly physicals and lab checks as recommended by your health care provider. It is important for your health care provider to check your urine for protein at least once every year.     . Obtain Hemoglobin A1C at least 2 times per year       Hgb AIC completed on 03/24/19    . Visit Primary Care Provider or Endocrinologist at least 2 times per year    On track    Review of medical record indicates client has completed  3 office visits with primary care provider in 2020 Call and schedule a yearly physical and follow up visit as recommended by your health care provider.          Plan:    RN care manager faxed today's note with updated individualized care plan to primary care provider and mailed updated individualized care plan to client along with education materials and consent form.  Chronic care management coordination will outreach in:  10-12 months  Will refer client to:  Health coach for assistance with smoking cessation and starting an exercise program.   Kassie Mends Nursing/RN [place] Case Manager, C-SNP [phone]

## 2019-08-07 ENCOUNTER — Other Ambulatory Visit: Payer: HMO

## 2019-08-07 ENCOUNTER — Encounter: Payer: Self-pay | Admitting: Hematology & Oncology

## 2019-08-07 ENCOUNTER — Inpatient Hospital Stay: Payer: HMO | Attending: Hematology & Oncology

## 2019-08-07 ENCOUNTER — Inpatient Hospital Stay (HOSPITAL_BASED_OUTPATIENT_CLINIC_OR_DEPARTMENT_OTHER): Payer: HMO | Admitting: Hematology & Oncology

## 2019-08-07 ENCOUNTER — Other Ambulatory Visit: Payer: Self-pay

## 2019-08-07 VITALS — BP 182/78 | HR 60 | Temp 96.8°F | Resp 19 | Wt 118.0 lb

## 2019-08-07 DIAGNOSIS — Z79899 Other long term (current) drug therapy: Secondary | ICD-10-CM | POA: Diagnosis not present

## 2019-08-07 DIAGNOSIS — D751 Secondary polycythemia: Secondary | ICD-10-CM | POA: Insufficient documentation

## 2019-08-07 DIAGNOSIS — D45 Polycythemia vera: Secondary | ICD-10-CM

## 2019-08-07 DIAGNOSIS — Z7951 Long term (current) use of inhaled steroids: Secondary | ICD-10-CM | POA: Diagnosis not present

## 2019-08-07 DIAGNOSIS — B1920 Unspecified viral hepatitis C without hepatic coma: Secondary | ICD-10-CM | POA: Diagnosis not present

## 2019-08-07 LAB — CMP (CANCER CENTER ONLY)
ALT: 11 U/L (ref 0–44)
AST: 19 U/L (ref 15–41)
Albumin: 4.5 g/dL (ref 3.5–5.0)
Alkaline Phosphatase: 76 U/L (ref 38–126)
Anion gap: 7 (ref 5–15)
BUN: 12 mg/dL (ref 8–23)
CO2: 28 mmol/L (ref 22–32)
Calcium: 9.8 mg/dL (ref 8.9–10.3)
Chloride: 91 mmol/L — ABNORMAL LOW (ref 98–111)
Creatinine: 0.74 mg/dL (ref 0.44–1.00)
GFR, Est AFR Am: >60 mL/min (ref >60)
GFR, Estimated: >60 mL/min (ref >60)
Glucose, Bld: 117 mg/dL — ABNORMAL HIGH (ref 70–99)
Potassium: 4.6 mmol/L (ref 3.5–5.1)
Sodium: 126 mmol/L — ABNORMAL LOW (ref 135–145)
Total Bilirubin: 0.7 mg/dL (ref 0.3–1.2)
Total Protein: 6.9 g/dL (ref 6.5–8.1)

## 2019-08-07 LAB — CBC WITH DIFFERENTIAL (CANCER CENTER ONLY)
Abs Immature Granulocytes: 0.02 10*3/uL (ref 0.00–0.07)
Basophils Absolute: 0 10*3/uL (ref 0.0–0.1)
Basophils Relative: 0 %
Eosinophils Absolute: 0.1 10*3/uL (ref 0.0–0.5)
Eosinophils Relative: 1 %
HCT: 45.2 % (ref 36.0–46.0)
Hemoglobin: 15.5 g/dL — ABNORMAL HIGH (ref 12.0–15.0)
Immature Granulocytes: 0 %
Lymphocytes Relative: 21 %
Lymphs Abs: 1.6 10*3/uL (ref 0.7–4.0)
MCH: 32.4 pg (ref 26.0–34.0)
MCHC: 34.3 g/dL (ref 30.0–36.0)
MCV: 94.6 fL (ref 80.0–100.0)
Monocytes Absolute: 0.7 10*3/uL (ref 0.1–1.0)
Monocytes Relative: 9 %
Neutro Abs: 5.3 10*3/uL (ref 1.7–7.7)
Neutrophils Relative %: 69 %
Platelet Count: 271 10*3/uL (ref 150–400)
RBC: 4.78 MIL/uL (ref 3.87–5.11)
RDW: 11.7 % (ref 11.5–15.5)
WBC Count: 7.7 10*3/uL (ref 4.0–10.5)
nRBC: 0 % (ref 0.0–0.2)

## 2019-08-07 NOTE — Progress Notes (Signed)
Hematology and Oncology Follow Up Visit  [name] [id number] 15-Nov-1950 69 y.o. 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  She does feel tired.  Her hematocrit is 45.2%.  She feels that she does need to have a phlebotomy.  We will see about getting this set up.  Her sodium is quite low.  Her sodium is 126.  I had to believe this is probably from her diuretic that she is on.  Apparently, the dose was increased recently.  Her blood pressure is out of control.  She started smoking again.  Maybe, the phlebotomy will help her.  She does have quite a few bruises.  She is on Plavix.  She has had no fever.  There has been no nausea or vomiting.  She has had no problems with bowels or bladder.  Overall, her performance status is ECOG 1.   Medications:  Allergies as of 08/07/2019      Reactions   Diphenhydramine Hives   Sulfa Antibiotics Nausea And Vomiting   Bactrim [sulfamethoxazole-trimethoprim] Nausea And Vomiting      Medication List       Accurate as of August 07, 2019  2:44 PM. If you have any questions, ask your nurse or doctor.        acetaminophen 325 MG tablet Commonly known as: TYLENOL Take 325-650 mg by mouth every 6 (six) hours as needed for mild pain or headache.   ALPRAZolam 0.5 MG tablet Commonly known as: XANAX Take 0.5 mg by mouth at bedtime.   Biotin 1000 MCG tablet Take 1,000 mcg by mouth daily.   Breo Ellipta 100-25 MCG/INH Aepb Generic drug: fluticasone furoate-vilanterol INHALE ONE PUFF ONCE A DAY   clopidogrel 75 MG tablet Commonly known as: PLAVIX Take 1 tablet by mouth once daily   clotrimazole-betamethasone cream Commonly known as:  LOTRISONE Apply 1 application topically 2 (two) times daily as needed (to affected area).   escitalopram 10 MG tablet Commonly known as: LEXAPRO Take 10 mg by mouth daily.   hydrochlorothiazide 25 MG tablet Commonly known as: HYDRODIURIL Take 1 tablet (25 mg total) by mouth daily.   lansoprazole 30 MG capsule Commonly known as: PREVACID Take 30 mg by mouth daily as needed (for ulcer/acid reflex). Ulcer/ acid reflux   metoprolol succinate 100 MG 24 hr tablet Commonly known as: TOPROL-XL TAKE 1 & 1/2 (ONE & ONE-HALF) TABLETS BY MOUTH ONCE DAILY . Please make yearly appt with [name] for May for future refills. 1st attempt   ProAir HFA 108 (90 Base) MCG/ACT inhaler Generic drug: albuterol Inhale 2 puffs into the lungs every 6 (six) hours as needed for wheezing or shortness of breath.   rosuvastatin 10 MG tablet Commonly known as: CRESTOR Take 1 tablet (10 mg total) by mouth every other day.   telmisartan 20 MG tablet Commonly known as: MICARDIS Take 1 tablet by mouth once daily   vitamin C 500 MG tablet Commonly known as: ASCORBIC ACID Take 500-1,000 mg by mouth daily.   VITAMIN C PO Take 2 tablets by mouth daily.   vitamin E 180 MG (400 UNITS) capsule Generic drug: vitamin E Take 400 Units by mouth  daily.       Allergies:  Allergies  Allergen Reactions  . Diphenhydramine Hives  . Sulfa Antibiotics Nausea And Vomiting  . Bactrim [Sulfamethoxazole-Trimethoprim] Nausea And Vomiting    Past Medical History, Surgical history, Social history, and Family History were reviewed and updated.  Review of Systems: Review of Systems  Constitutional: Positive for malaise/fatigue.  HENT: Negative.   Eyes: Negative.   Respiratory: Negative.   Cardiovascular: Negative.   Gastrointestinal: Positive for abdominal pain, diarrhea and nausea.  Genitourinary: Negative.   Musculoskeletal: Negative.   Skin: Negative.   Neurological: Negative.   Endo/Heme/Allergies: Negative.    Psychiatric/Behavioral: Negative.      Physical Exam:  weight is 118 lb (53.5 kg). Her temporal temperature is 96.8 F (36 C) (abnormal). Her blood pressure is 182/78 (abnormal) and her pulse is 60. Her respiration is 19 and oxygen saturation is 98%.   Wt Readings from Last 3 Encounters:  08/07/19 118 lb (53.5 kg)  05/22/19 120 lb (54.4 kg)  04/29/19 117 lb 12.8 oz (53.4 kg)    Physical Exam Vitals reviewed.  HENT:     Head: Normocephalic and atraumatic.  Eyes:     Pupils: Pupils are equal, round, and reactive to light.  Neck:     Comments: On the right side of her neck, there is a firmness.  This is in the midline of the right side of her neck.  It might be close to the carotid.  There is no pulsations.  It is nontender. Cardiovascular:     Rate and Rhythm: Normal rate and regular rhythm.     Heart sounds: Normal heart sounds.  Pulmonary:     Effort: Pulmonary effort is normal.     Breath sounds: Normal breath sounds.  Abdominal:     General: Bowel sounds are normal.     Palpations: Abdomen is soft.  Musculoskeletal:        General: No tenderness or deformity. Normal range of motion.     Cervical back: Normal range of motion.  Lymphadenopathy:     Cervical: No cervical adenopathy.  Skin:    General: Skin is warm and dry.     Findings: No erythema or rash.  Neurological:     Mental Status: She is alert and oriented to person, place, and time.  Psychiatric:        Behavior: Behavior normal.        Thought Content: Thought content normal.        Judgment: Judgment normal.     Lab Results  Component Value Date   WBC 7.7 08/07/2019   HGB 15.5 (H) 08/07/2019   HCT 45.2 08/07/2019   MCV 94.6 08/07/2019   PLT 271 08/07/2019   Lab Results  Component Value Date   FERRITIN 31 04/09/2019   IRON 90 04/09/2019   TIBC 429 04/09/2019   UIBC 339 04/09/2019   IRONPCTSAT 21 04/09/2019   Lab Results  Component Value Date   RETICCTPCT 1.2 08/03/2010   RBC 4.78  08/07/2019   RETICCTABS 61.3 08/03/2010   No results found for: KPAFRELGTCHN, LAMBDASER, KAPLAMBRATIO No results found for: IGGSERUM, IGA, IGMSERUM No results found for: Ronnald Ramp, A1GS, A2GS, Violet Baldy, MSPIKE, SPEI   Chemistry      Component Value Date/Time   NA 126 (L) 08/07/2019 1338   NA 137 04/01/2019 1649   NA 139 01/03/2017 1327   NA 136 12/28/2015 1311   K 4.6 08/07/2019 1338   K 3.7 01/03/2017  1327   K 4.9 12/28/2015 1311   CL 91 (L) 08/07/2019 1338   CL 94 (L) 01/03/2017 1327   CO2 28 08/07/2019 1338   CO2 31 01/03/2017 1327   CO2 28 12/28/2015 1311   BUN 12 08/07/2019 1338   BUN 14 04/01/2019 1649   BUN 12 01/03/2017 1327   BUN 10.4 12/28/2015 1311   CREATININE 0.74 08/07/2019 1338   CREATININE 1.0 01/03/2017 1327   CREATININE 0.9 12/28/2015 1311      Component Value Date/Time   CALCIUM 9.8 08/07/2019 1338   CALCIUM 8.8 01/03/2017 1327   CALCIUM 9.6 12/28/2015 1311   ALKPHOS 76 08/07/2019 1338   ALKPHOS 85 (H) 01/03/2017 1327   ALKPHOS 83 12/28/2015 1311   AST 19 08/07/2019 1338   AST 25 12/28/2015 1311   ALT 11 08/07/2019 1338   ALT 19 01/03/2017 1327   ALT 13 12/28/2015 1311   BILITOT 0.7 08/07/2019 1338   BILITOT 0.74 12/28/2015 1311     Impression and Plan: Ms. Steffler is a 69 yo white female with polycythemia.   We will go ahead and phlebotomize her.  Hopefully we get this done next week.  She likes to have it done at the main cancer center.  We will plan to get her back in 3 months.  I told her that she probably needs to see her cardiologist her family doctor about the blood pressure and hyponatremia issues.    [name], MD 6/17/20212:44 PM

## 2019-08-08 LAB — IRON AND TIBC
Iron: 95 ug/dL (ref 41–142)
Saturation Ratios: 22 % (ref 21–57)
TIBC: 439 ug/dL (ref 236–444)
UIBC: 344 ug/dL (ref 120–384)

## 2019-08-08 LAB — FERRITIN: Ferritin: 33 ng/mL (ref 11–307)

## 2019-08-08 LAB — LACTATE DEHYDROGENASE: LDH: 205 U/L — ABNORMAL HIGH (ref 98–192)

## 2019-08-10 ENCOUNTER — Other Ambulatory Visit: Payer: Self-pay | Admitting: Interventional Cardiology

## 2019-08-11 ENCOUNTER — Inpatient Hospital Stay: Payer: HMO

## 2019-08-11 ENCOUNTER — Other Ambulatory Visit: Payer: Self-pay

## 2019-08-11 DIAGNOSIS — D751 Secondary polycythemia: Secondary | ICD-10-CM | POA: Diagnosis not present

## 2019-08-11 NOTE — Patient Instructions (Signed)

## 2019-10-16 ENCOUNTER — Other Ambulatory Visit: Payer: Self-pay | Admitting: Interventional Cardiology

## 2019-10-21 DIAGNOSIS — F419 Anxiety disorder, unspecified: Secondary | ICD-10-CM | POA: Diagnosis not present

## 2019-10-21 DIAGNOSIS — I10 Essential (primary) hypertension: Secondary | ICD-10-CM | POA: Diagnosis not present

## 2019-10-21 DIAGNOSIS — J449 Chronic obstructive pulmonary disease, unspecified: Secondary | ICD-10-CM | POA: Diagnosis not present

## 2019-10-21 DIAGNOSIS — D45 Polycythemia vera: Secondary | ICD-10-CM | POA: Diagnosis not present

## 2019-10-21 DIAGNOSIS — F330 Major depressive disorder, recurrent, mild: Secondary | ICD-10-CM | POA: Diagnosis not present

## 2019-10-21 DIAGNOSIS — I700 Atherosclerosis of aorta: Secondary | ICD-10-CM | POA: Diagnosis not present

## 2019-10-21 DIAGNOSIS — I2510 Atherosclerotic heart disease of native coronary artery without angina pectoris: Secondary | ICD-10-CM | POA: Diagnosis not present

## 2019-10-21 DIAGNOSIS — R7303 Prediabetes: Secondary | ICD-10-CM | POA: Diagnosis not present

## 2019-10-21 DIAGNOSIS — F17210 Nicotine dependence, cigarettes, uncomplicated: Secondary | ICD-10-CM | POA: Diagnosis not present

## 2019-10-21 DIAGNOSIS — I779 Disorder of arteries and arterioles, unspecified: Secondary | ICD-10-CM | POA: Diagnosis not present

## 2019-10-21 DIAGNOSIS — E782 Mixed hyperlipidemia: Secondary | ICD-10-CM | POA: Diagnosis not present

## 2019-11-06 ENCOUNTER — Inpatient Hospital Stay: Payer: HMO | Attending: Hematology & Oncology

## 2019-11-06 ENCOUNTER — Inpatient Hospital Stay: Payer: HMO | Admitting: Hematology & Oncology

## 2019-12-16 ENCOUNTER — Telehealth: Payer: Self-pay | Admitting: Hematology & Oncology

## 2019-12-16 NOTE — Telephone Encounter (Signed)
Returned call to patient.  She rescheduled her 9/16 appointments

## 2019-12-17 ENCOUNTER — Telehealth: Payer: Self-pay | Admitting: Hematology & Oncology

## 2019-12-17 NOTE — Telephone Encounter (Signed)
Called and LMVM for patient regarding her appointments for 10/28 in the afternoon that needed to be rescheduled due to provider being on [facility] 10/28

## 2019-12-18 ENCOUNTER — Other Ambulatory Visit: Payer: HMO

## 2019-12-18 ENCOUNTER — Ambulatory Visit: Payer: HMO | Admitting: Hematology & Oncology

## 2020-01-01 ENCOUNTER — Encounter: Payer: Self-pay | Admitting: Family

## 2020-01-01 ENCOUNTER — Other Ambulatory Visit: Payer: Self-pay

## 2020-01-01 ENCOUNTER — Inpatient Hospital Stay: Payer: HMO | Attending: Hematology & Oncology

## 2020-01-01 ENCOUNTER — Ambulatory Visit: Payer: HMO | Admitting: Hematology & Oncology

## 2020-01-01 ENCOUNTER — Inpatient Hospital Stay (HOSPITAL_BASED_OUTPATIENT_CLINIC_OR_DEPARTMENT_OTHER): Payer: HMO | Admitting: Family

## 2020-01-01 VITALS — BP 175/75 | HR 70 | Temp 98.3°F | Resp 18

## 2020-01-01 DIAGNOSIS — B1920 Unspecified viral hepatitis C without hepatic coma: Secondary | ICD-10-CM | POA: Insufficient documentation

## 2020-01-01 DIAGNOSIS — D45 Polycythemia vera: Secondary | ICD-10-CM

## 2020-01-01 DIAGNOSIS — Z79899 Other long term (current) drug therapy: Secondary | ICD-10-CM | POA: Diagnosis not present

## 2020-01-01 DIAGNOSIS — D500 Iron deficiency anemia secondary to blood loss (chronic): Secondary | ICD-10-CM

## 2020-01-01 DIAGNOSIS — D751 Secondary polycythemia: Secondary | ICD-10-CM | POA: Insufficient documentation

## 2020-01-01 LAB — CMP (CANCER CENTER ONLY)
ALT: 11 U/L (ref 0–44)
AST: 20 U/L (ref 15–41)
Albumin: 4.3 g/dL (ref 3.5–5.0)
Alkaline Phosphatase: 68 U/L (ref 38–126)
Anion gap: 6 (ref 5–15)
BUN: 14 mg/dL (ref 8–23)
CO2: 31 mmol/L (ref 22–32)
Calcium: 9.8 mg/dL (ref 8.9–10.3)
Chloride: 95 mmol/L — ABNORMAL LOW (ref 98–111)
Creatinine: 0.76 mg/dL (ref 0.44–1.00)
GFR, Estimated: >60 mL/min (ref >60)
Glucose, Bld: 117 mg/dL — ABNORMAL HIGH (ref 70–99)
Potassium: 5 mmol/L (ref 3.5–5.1)
Sodium: 132 mmol/L — ABNORMAL LOW (ref 135–145)
Total Bilirubin: 0.8 mg/dL (ref 0.3–1.2)
Total Protein: 6.9 g/dL (ref 6.5–8.1)

## 2020-01-01 LAB — CBC WITH DIFFERENTIAL (CANCER CENTER ONLY)
Abs Immature Granulocytes: 0.02 10*3/uL (ref 0.00–0.07)
Basophils Absolute: 0 10*3/uL (ref 0.0–0.1)
Basophils Relative: 0 %
Eosinophils Absolute: 0 10*3/uL (ref 0.0–0.5)
Eosinophils Relative: 1 %
HCT: 42.3 % (ref 36.0–46.0)
Hemoglobin: 14.1 g/dL (ref 12.0–15.0)
Immature Granulocytes: 0 %
Lymphocytes Relative: 21 %
Lymphs Abs: 1.4 10*3/uL (ref 0.7–4.0)
MCH: 31 pg (ref 26.0–34.0)
MCHC: 33.3 g/dL (ref 30.0–36.0)
MCV: 93 fL (ref 80.0–100.0)
Monocytes Absolute: 0.8 10*3/uL (ref 0.1–1.0)
Monocytes Relative: 12 %
Neutro Abs: 4.2 10*3/uL (ref 1.7–7.7)
Neutrophils Relative %: 66 %
Platelet Count: 245 10*3/uL (ref 150–400)
RBC: 4.55 MIL/uL (ref 3.87–5.11)
RDW: 13.4 % (ref 11.5–15.5)
WBC Count: 6.4 10*3/uL (ref 4.0–10.5)
nRBC: 0 % (ref 0.0–0.2)

## 2020-01-01 NOTE — Progress Notes (Signed)
Hematology and Oncology Follow Up Visit  [name] [id number] 1950/08/19 69 y.o. 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 Unfortunately, her sweet mother passed away around 6 weeks ago. They were very close and lived together so this has been quite difficult for her.  She has not been eating or hydrating well and feels fatigued. She has also had trouble sleeping at night.  Her Hct today is stable at 42.3%.  She slipped and fell in she shower recently and has bruising down the back of her right buttock and leg. No hematoma, redness or inflammation noted on exam. This appears to be healing. She also has several fading bruises on her back where she hit the soap rack during her fall. She is still quite sore. She denies syncope.  No fever, chills, n/v, rash, dizziness, chest pain, palpitations, abdominal pain or changes in bowel or bladder habits.  Her sodium is now 132 and chloride 95.  Her weight is stable at 116 lbs.   ECOG Performance Status: 1 - Symptomatic but completely ambulatory  Medications:  Allergies as of 01/01/2020      Reactions   Diphenhydramine Hives   Sulfa Antibiotics Nausea And Vomiting   Bactrim [sulfamethoxazole-trimethoprim] Nausea And Vomiting      Medication List       Accurate as of January 01, 2020 12:46 PM. If you have any questions, ask your nurse or doctor.        acetaminophen 325 MG tablet Commonly known as: TYLENOL Take 325-650 mg by mouth every 6 (six) hours as needed for mild pain or headache.   ALPRAZolam 0.5 MG tablet Commonly known as: XANAX Take 0.5 mg by mouth at bedtime.   Biotin 1000 MCG tablet Take 1,000 mcg by mouth daily.     Breo Ellipta 100-25 MCG/INH Aepb Generic drug: fluticasone furoate-vilanterol INHALE ONE PUFF ONCE A DAY   clopidogrel 75 MG tablet Commonly known as: PLAVIX Take 1 tablet by mouth once daily   clotrimazole-betamethasone cream Commonly known as: LOTRISONE Apply 1 application topically 2 (two) times daily as needed (to affected area).   escitalopram 10 MG tablet Commonly known as: LEXAPRO Take 10 mg by mouth daily.   hydrochlorothiazide 25 MG tablet Commonly known as: HYDRODIURIL Take 1 tablet (25 mg total) by mouth daily.   lansoprazole 30 MG capsule Commonly known as: PREVACID Take 30 mg by mouth daily as needed (for ulcer/acid reflex). Ulcer/ acid reflux   metoprolol succinate 100 MG 24 hr tablet Commonly known as: TOPROL-XL TAKE 1 & 1/2 (ONE & ONE-HALF) TABLETS BY MOUTH ONCE DAILY . Please make yearly appt with [name] for May for future refills. 1st attempt   ProAir HFA 108 (90 Base) MCG/ACT inhaler Generic drug: albuterol Inhale 2 puffs into the lungs every 6 (six) hours as needed for wheezing or shortness of breath.   rosuvastatin 10 MG tablet Commonly known as: CRESTOR Take 1 tablet (10 mg total) by mouth every other day.   telmisartan 20 MG tablet Commonly known as: MICARDIS Take 1 tablet by mouth once daily   vitamin C 500 MG tablet Commonly known as: ASCORBIC ACID Take 500-1,000  mg by mouth daily.   VITAMIN C PO Take 2 tablets by mouth daily.   vitamin E 180 MG (400 UNITS) capsule Generic drug: vitamin E Take 400 Units by mouth daily.       Allergies:  Allergies  Allergen Reactions   Diphenhydramine Hives   Sulfa Antibiotics Nausea And Vomiting   Bactrim [Sulfamethoxazole-Trimethoprim] Nausea And Vomiting    Past Medical History, Surgical history, Social history, and Family History were reviewed and updated.  Review of Systems: All other 10 point review of systems is negative.   Physical Exam:  oral temperature is 98.3 F (36.8 C).  Her blood pressure is 175/75 (abnormal) and her pulse is 70. Her respiration is 18 and oxygen saturation is 100%.   Wt Readings from Last 3 Encounters:  08/07/19 118 lb (53.5 kg)  05/22/19 120 lb (54.4 kg)  04/29/19 117 lb 12.8 oz (53.4 kg)    Ocular: Sclerae unicteric, pupils equal, round and reactive to light Ear-nose-throat: Oropharynx clear, dentition fair Lymphatic: No cervical or supraclavicular adenopathy Lungs no rales or rhonchi, good excursion bilaterally Heart regular rate and rhythm, no murmur appreciated Abd soft, nontender, positive bowel sounds MSK no focal spinal tenderness, no joint edema Neuro: non-focal, well-oriented, appropriate affect Breasts: Deferred   Lab Results  Component Value Date   WBC 6.4 01/01/2020   HGB 14.1 01/01/2020   HCT 42.3 01/01/2020   MCV 93.0 01/01/2020   PLT 245 01/01/2020   Lab Results  Component Value Date   FERRITIN 33 08/07/2019   IRON 95 08/07/2019   TIBC 439 08/07/2019   UIBC 344 08/07/2019   IRONPCTSAT 22 08/07/2019   Lab Results  Component Value Date   RETICCTPCT 1.2 08/03/2010   RBC 4.55 01/01/2020   RETICCTABS 61.3 08/03/2010   No results found for: KPAFRELGTCHN, LAMBDASER, KAPLAMBRATIO No results found for: IGGSERUM, IGA, IGMSERUM No results found for: Kathrynn Ducking, MSPIKE, SPEI   Chemistry      Component Value Date/Time   NA 132 (L) 01/01/2020 1210   NA 137 04/01/2019 1649   NA 139 01/03/2017 1327   NA 136 12/28/2015 1311   K 5.0 01/01/2020 1210   K 3.7 01/03/2017 1327   K 4.9 12/28/2015 1311   CL 95 (L) 01/01/2020 1210   CL 94 (L) 01/03/2017 1327   CO2 31 01/01/2020 1210   CO2 31 01/03/2017 1327   CO2 28 12/28/2015 1311   BUN 14 01/01/2020 1210   BUN 14 04/01/2019 1649   BUN 12 01/03/2017 1327   BUN 10.4 12/28/2015 1311   CREATININE 0.76 01/01/2020 1210   CREATININE 1.0 01/03/2017 1327   CREATININE 0.9 12/28/2015 1311      Component Value Date/Time    CALCIUM 9.8 01/01/2020 1210   CALCIUM 8.8 01/03/2017 1327   CALCIUM 9.6 12/28/2015 1311   ALKPHOS 68 01/01/2020 1210   ALKPHOS 85 (H) 01/03/2017 1327   ALKPHOS 83 12/28/2015 1311   AST 20 01/01/2020 1210   AST 25 12/28/2015 1311   ALT 11 01/01/2020 1210   ALT 19 01/03/2017 1327   ALT 13 12/28/2015 1311   BILITOT 0.8 01/01/2020 1210   BILITOT 0.74 12/28/2015 1311       Impression and Plan: Ms. Mezquita is a 69 yo caucasian female with polycythemia.  Hct is stable at 42.3% so no phlebotomy needed this visit.  She was encouraged to increase her fluid intake, try her best to eat and rest. Follow-up in 3  months.  Greater than 50% of her appointment was spent counseling and coordinating care. She can contact our office with any questions or concerns.    Laverna Peace, NP 11/11/202112:46 PM

## 2020-01-02 ENCOUNTER — Telehealth: Payer: Self-pay

## 2020-01-02 LAB — IRON AND TIBC
Iron: 101 ug/dL (ref 41–142)
Saturation Ratios: 22 % (ref 21–57)
TIBC: 453 ug/dL — ABNORMAL HIGH (ref 236–444)
UIBC: 351 ug/dL (ref 120–384)

## 2020-01-02 LAB — FERRITIN: Ferritin: 64 ng/mL (ref 11–307)

## 2020-01-02 NOTE — Telephone Encounter (Signed)
S/w pt and she is aware of her 04/05/20 appt per 01/01/20 LOS...,AOM

## 2020-02-06 ENCOUNTER — Other Ambulatory Visit: Payer: Self-pay | Admitting: *Deleted

## 2020-02-06 NOTE — Patient Outreach (Signed)
  [facility]) Care Management Chronic Special Needs Program    02/06/2020  Name: [name], DOB: 1950/10/28  MRN: [id number]   [name] is enrolled in a chronic special needs plan for Diabetes.  [facility] care management will continue to provide services for this member through 02/20/20.  [facility] care management team will assume care 02/21/20.  Jacqlyn Larsen [facility], BSN [place], [place]

## 2020-02-26 ENCOUNTER — Other Ambulatory Visit: Payer: Self-pay | Admitting: *Deleted

## 2020-03-03 NOTE — Progress Notes (Addendum)
Virtual Visit via Telephone Note   This visit type was conducted due to national recommendations for restrictions regarding the COVID-19 Pandemic (e.g. social distancing) in an effort to limit this patient's exposure and mitigate transmission in our community.  Due to her co-morbid illnesses, this patient is at least at moderate risk for complications without adequate follow up.  This format is felt to be most appropriate for this patient at this time.  The patient did not have access to video technology/had technical difficulties with video requiring transitioning to audio format only (telephone).  All issues noted in this document were discussed and addressed.  No physical exam could be performed with this format.  Please refer to the patient's chart for her  consent to telehealth for [facility].    Date:  03/10/2020   ID:  [name], DOB 1950-03-19, MRN [id number] The patient was identified using 2 identifiers.  Patient Location: Home Provider Location: Home Office  PCP:  Wenda Low, MD  Cardiologist:  Sinclair Grooms, MD  Electrophysiologist:  None   Evaluation Performed:  Follow-Up Visit  Chief Complaint:  Follow up   History of Present Illness:    [name] is a 70 y.o. female who presents for 9 month follow up, seen for [name].   Ms. Enright has a hx of HTN, CAD s/p PCI to LCx 2010 with residual disease, carotid artery disease s/p L CEA 07/2016 and R CEA 2007 (1-39% by duplex 05/2017 followed by [facility]), polycythemia vera followed by hematology, hepatitis C, tobacco abuse, COPD, GERD, anxiety, and medication noncompliance.  She was last seen in follow up 04/2019 at which time she had an episode of syncope 03/2019 and was seen by APP 04/01/2019. Echocardiogram was performed that was unremarkable. She was wearing a monitor on last evaluation with [name]. This was returned 06/05/2019 that showed NSR, SB and no arrhythmia noted. There was no AF.  Carotid doppler study showed bilateral right and left 1-39% stenosis. PET scan performed 04/30/19 that showed no malignancy in the right side of the neck due to a left neck mass.   Today she presents for follow up and states that she has been doing well from a CV standpoint.  She reports she has been following whole 30 for diet plans and has lost some weight.  She reports she feels better since starting this plan.  She is almost completely eliminated sugar from her diet.  She has been exercising more.  She does continue to smoke approximately 1 to 1-1/2 packs of cigarettes per day however seems to be ready to quit.  Home BP today was elevated at 159/98 however reports that she has not yet taken her medications for the day.  She reports her mother passed away approximately 2 months ago and has she has not been able to get back on a routine since then.  She has no specific time to take her medications.  We discussed following her BP closely for the next 5 to 7 days and keeping a close BP log.  Long discussion regarding when and how to take her BP including 1 to 2 hours after taking her antihypertensives, sitting for 15 minutes in a quiet environment, not smoking prior to taking her blood pressure and avoiding high sodium foods.  She is agreeable to follow with me in approximately 1 week.  If BP continues to be high, will make further adjustments at that time.  She denies chest  pain, palpitations, shortness of breath, PND, LE edema, orthopnea, presyncopal or syncopal episodes.  The patient does not have symptoms concerning for COVID-19 infection (fever, chills, cough, or new shortness of breath).    Past Medical History:  Diagnosis Date  . Anxiety   . Arthritis   . CAD (coronary artery disease)    a. s/p LCX stent 2010 with residual disease.  . Carotid artery disease ([place])   . COPD (chronic obstructive pulmonary disease) ([place])   . Depression   . GERD (gastroesophageal reflux disease)   . Hepatitis C    . Hypertension   . Noncompliance with medication regimen   . Polycythemia   . PVD (peripheral vascular disease) ([place])   . Seizures ([place]) 1970   x1- pt. reports that there was never any causative agent     Past Surgical History:  Procedure Laterality Date  . 2 stints  Aug 26,2010   Dr. Tawnya Crook  . ABDOMINAL SURGERY  1970's   for excessive bleeding from loss of pregnancy, required blood transfusion post procedure   . CAROTID ENDARTERECTOMY  2007  . cartoid endartherectomy  2007  . CORONARY ANGIOPLASTY WITH STENT PLACEMENT  2010  . CORONARY ANGIOPLASTY WITH STENT PLACEMENT  10/09/2008  . ENDARTERECTOMY Left 07/24/2016   Procedure: ENDARTERECTOMY CAROTID;  Surgeon: [name], MD;  Location: [place];  Service: Vascular;  Laterality: Left;  . FLEXIBLE SIGMOIDOSCOPY N/A 03/22/2017   Procedure: FLEXIBLE SIGMOIDOSCOPY;  Surgeon: Carol Ada, MD;  Location: [place];  Service: Endoscopy;  Laterality: N/A;  . LYMPH NODE DISSECTION Left 1990's   benign   . ORIF ELBOW FRACTURE Right 04/26/2018   Procedure: OPEN REDUCTION INTERNAL FIXATION (ORIF) ELBOW/OLECRANON FRACTURE;  Surgeon: Shona Needles, MD;  Location: [place];  Service: Orthopedics;  Laterality: Right;  . TUBAL LIGATION       No outpatient medications have been marked as taking for the 03/10/20 encounter (Appointment) with Tommie Raymond, NP.     Allergies:   Diphenhydramine, Sulfa antibiotics, and Bactrim [sulfamethoxazole-trimethoprim]   Social History   Tobacco Use  . Smoking status: Former Smoker    Packs/day: 1.00    Years: 48.00    Pack years: 48.00    Types: Cigarettes    Start date: 03/20/1968    Quit date: 04/16/2019    Years since quitting: 0.9  . Smokeless tobacco: Never Used  Vaping Use  . Vaping Use: Some days  Substance Use Topics  . Alcohol use: Yes    Alcohol/week: 0.0 standard drinks    Comment: socially occasionally  . Drug use: No    Comment: has smoked cigarettes off and on     Family Hx: The  patient's family history includes Heart disease in her father and mother.  ROS:   Please see the history of present illness.     All other systems reviewed and are negative.  Prior CV studies:   The following studies were reviewed today:  Monitor 06/05/19:   Normal sinus rhthm and sinus bradycardia  No arrhythmia, i.e. no atrial fibrillation.  No complaints  Echocardiogram 04/14/19:  1. Left ventricular ejection fraction, by estimation, is 60 to 65%. The  left ventricle has normal function. The left ventricle has no regional  wall motion abnormalities. Left ventricular diastolic parameters are  consistent with Grade II diastolic  dysfunction (pseudonormalization).  2. Right ventricular systolic function is normal. The right ventricular  size is normal.  3. The mitral valve is normal in structure and  function. Trivial mitral  valve regurgitation. No evidence of mitral stenosis.  4. The aortic valve is normal in structure and function. Aortic valve  regurgitation is not visualized. No aortic stenosis is present.  5. The inferior vena cava is normal in size with greater than 50%  respiratory variability, suggesting right atrial pressure of 3 mmHg.     NST 10/2017            Nuclear stress EF: 52%.            There was no ST segment deviation noted during stress.            This is a low risk study.            The left ventricular ejection fraction is mildly decreased (45-54%).   1. EF 52%.  Gating does not look ideal, would confirm EF with echo.  2. No evidence for ischemia or infarction on perfusion images.   Low risk study.   Cardiac Cath 2010 CARDIAC CATHETERIZATION   PROCEDURES:  Left heart catheterization with selective coronary  angiography, left ventricular angiography, and a sequential stent placed  in the left circumflex coronary artery (drug eluting).   TYPE AND SITE OF ENTRY:  Percutaneous right femoral artery with Angio-  Seal.   CATHETERS:   A 6-French JL4 left coronary catheter, 6-French right  coronary catheter, 6-French pigtail ventriculographic catheter, guide  catheter, a 6-French JL4 guide, Prowater guidewire, predilatation with a  2.5 x 20 mm Apex distally and a 2.5 x 12 mm Voyager proximally with  stents being a 2.5 x 23 mm XIENCE stent placed distally and a 2.5 x 12  mm XIENCE stent proximally.   MEDICATION GIVEN PRIOR TO PROCEDURE:  Valium 10 mg p.o.   MEDICATIONS GIVEN DURING THE PROCEDURE:  Versed 4 mg IV, fentanyl 25  mcg, Plavix, Angiomax, and Ancef.   COMMENTS:  The patient tolerated the procedure well.   HEMODYNAMIC DATA:  The aortic pressure was 133/66, LV was 129/6-12.  There was no aortic valve gradient noted on pullback.   ANGIOGRAPHIC DATA:  1. Left main coronary artery is normal.  2. The left circumflex has a long segment of a diffuse stenosis with      an area in the mid section with a continuation branch and a small      obtuse marginal that does not have severe obstruction.  The most      significant focal stenosis was within the segmental area of disease      was 90%.  There was diffuse 60-80% stenosis throughout this vessel.      Distally, the left circumflex appeared to be relatively normal.  3. The left anterior descending was heavily calcified.  There was      diffuse moderate stenosis scattered throughout the left anterior      descending and diagonal systems but no greater than 30-40%      narrowing.  There were 2 diagonal vessels present.  4. Right coronary artery:  The right coronary artery is a dominant      vessel.  There is 60-70% stenosis in its segment before the crux,      but the stenotic area is associated with a relatively small distal      vessel.  There is a larger right ventricular branch that arises in      the right coronary artery prior to the stenotic segments.  While      this is moderately severe  to severe, it does have a potential of      being flow limiting  but is borderline.   Left ventricular angiogram was performed in the RAO position.  Overall,  cardiac size and silhouette were normal, global ejection fraction is  60%.  Regional wall motion is normal.   ANGIOPLASTY PROCEDURE:  We used a JL4 guide, the 6-French.  Prowater  guidewire was passed into the course of the left circumflex.  We  predilated it with a 2.5 x 20 mm Apex balloon in the more distal part of  the segments and then tried to pass a 23-mm stent directly beyond more  proximal stenosis without predilatation.  We were unable to pass the  stent.  We then returned and predilated the more proximal lesion with a  2.5 x 12 mm Voyager balloon.   We then returned and placed the 2.5 x 23 mm XIENCE (drug-eluting stent)  in the left circumflex just distal to the 2 largest branches.  This  stent was inflated to 12 atmospheres with full inflation and no residual  stenosis.  We then returned with a 12 mm x 2.5 mm XIENCE stent and  placed it proximally.  We left approximately 3-5 mm between the stents  that would correspond to the area where both the branch in the AV groove  and a small marginal arise.  This area between the 2 stents was left  untreated.  We inflated the more proximal XIENCE to 12 atmospheres with  a nice inflation and a smooth contour.   The patient developed a chest pain with stent and balloon inflation.  This was different from the left arm discomfort and toothache that she  had been experiencing that lead to the catheterization.   OVERALL IMPRESSION:  1. Severe stenosis in the left circumflex system with successful stent      placement in sequential fashion.  2. There is residual moderately severe distal right coronary artery      stenosis.  3. Mild to moderate atherosclerosis in the left anterior descending.  4. Normal left ventricular function.   PLAN:  The patient will be managed medically and encouraged to modify  cardiovascular risk factors.  She  will need to have followup stress  testing and consideration of treatment of the right coronary artery.  Labs/Other Tests and Data Reviewed:    EKG:  No ECG reviewed.  Recent Labs: 04/01/2019: Magnesium 2.0 01/01/2020: ALT 11; BUN 14; Creatinine 0.76; Hemoglobin 14.1; Platelet Count 245; Potassium 5.0; Sodium 132   Recent Lipid Panel Lab Results  Component Value Date/Time   CHOL 142 12/04/2017 12:03 PM   TRIG 95 12/04/2017 12:03 PM   HDL 62 12/04/2017 12:03 PM   CHOLHDL 2.3 12/04/2017 12:03 PM   CHOLHDL 5.1 (H) 09/16/2014 01:22 PM   LDLCALC 61 12/04/2017 12:03 PM    Wt Readings from Last 3 Encounters:  08/07/19 118 lb (53.5 kg)  05/22/19 120 lb (54.4 kg)  04/29/19 117 lb 12.8 oz (53.4 kg)     Objective:    Vital Signs:  There were no vitals taken for this visit.   VITAL SIGNS:  reviewed GEN:  no acute distress NEURO:  alert and oriented x 3, no obvious focal deficit PSYCH:  normal affect  ASSESSMENT & PLAN:     1. CAD without angina: -s/p PCI to LCx 2010 with residual disease, see cath report above -No recent anginal symptoms -Continue current regimen  2. Pre-synope: -Pt was seen 03/2019 for  pre-syncope with an essentially unremarkable workup  -Echo with stable LVEF along with normal cardiac monitor and stable carotid artery Doppler study -Reports no further presyncopal symptoms since last evaluation  3. Bilateral carotid artery disease: -Last carotid doppler study performed in the setting of syncope showed no change from prior study -Continue current regimen  4. HLD: -Last LDL, 61 -Continue current regimen  5. Tobacco Use: -Reports she continues to smoke 1-1 and half packs per day however is ready to quit -We will supply with nicotine patch 21 mg daily  6. COPD: -Ongoing tobacco use however reports readiness for cessation  7.  Hypertension: -BP elevated today however reports she is not taken her a.m. antihypertensives -Plan to follow with 5 to 7-day BP  log, taking BP 1 to 2 hours after antihypertensive medications including sitting for 15 minutes prior to BP reading, avoiding smoking near this time and continuing with a low-sodium diet. -If BP continues to be elevated, will need to address titrating medications at that time   Current medicines are reviewed at length with the patient today.  The patient does not have concerns regarding medicines.  The following changes have been made:  no change  Labs/ tests ordered today include: None  No orders of the defined types were placed in this encounter.  Disposition:   FU with myself in 1 week  Addend: Time spent with patient 15 minutes   Signed, [name], NP  03/03/2020 2:56 PM    [facility] [address], [place], [place]  [id number] Phone: [phone]; Fax: [phone]

## 2020-03-10 ENCOUNTER — Telehealth (INDEPENDENT_AMBULATORY_CARE_PROVIDER_SITE_OTHER): Payer: HMO | Admitting: Cardiology

## 2020-03-10 ENCOUNTER — Encounter: Payer: Self-pay | Admitting: Cardiology

## 2020-03-10 ENCOUNTER — Other Ambulatory Visit: Payer: Self-pay

## 2020-03-10 VITALS — BP 159/98 | HR 63

## 2020-03-10 DIAGNOSIS — Z720 Tobacco use: Secondary | ICD-10-CM

## 2020-03-10 DIAGNOSIS — I25119 Atherosclerotic heart disease of native coronary artery with unspecified angina pectoris: Secondary | ICD-10-CM

## 2020-03-10 DIAGNOSIS — Z9889 Other specified postprocedural states: Secondary | ICD-10-CM

## 2020-03-10 DIAGNOSIS — E785 Hyperlipidemia, unspecified: Secondary | ICD-10-CM

## 2020-03-10 DIAGNOSIS — I10 Essential (primary) hypertension: Secondary | ICD-10-CM

## 2020-03-10 NOTE — Patient Instructions (Signed)
Medication Instructions:  Your physician recommends that you continue on your current medications as directed. Please refer to the Current Medication list given to you today.  *If you need a refill on your cardiac medications before your next appointment, please call your pharmacy*   Lab Work: NONE  If you have labs (blood work) drawn today and your tests are completely normal, you will receive your results only by: Marland Kitchen MyChart Message (if you have MyChart) OR . A paper copy in the mail If you have any lab test that is abnormal or we need to change your treatment, we will call you to review the results.   Testing/Procedures: NONE   Follow-Up: At [facility], you and your health needs are our priority.  As part of our continuing mission to provide you with exceptional heart care, we have created designated Provider Care Teams.  These Care Teams include your primary Cardiologist (physician) and Advanced Practice Providers (APPs -  Physician Assistants and Nurse Practitioners) who all work together to provide you with the care you need, when you need it.  We recommend signing up for the patient portal called "MyChart".  Sign up information is provided on this After Visit Summary.  MyChart is used to connect with patients for Virtual Visits (Telemedicine).  Patients are able to view lab/test results, encounter notes, upcoming appointments, etc.  Non-urgent messages can be sent to your provider as well.   To learn more about what you can do with MyChart, go to [email or web address].    Your next appointment:   January 25 AT 11:45 AM  The format for your next appointment:   Virtual Visit   Provider:   [name], NP

## 2020-03-15 NOTE — Progress Notes (Unsigned)
{Choose 1 Note Type (Video or Telephone):[phone]}    Date:  03/15/2020   ID:  [name], DOB 1950/05/28, MRN [id number] The patient was identified using 2 identifiers.  {Patient Location:[phone]::"Home"} {Provider Location:[phone]::"Home Office"}  PCP:  Wenda Low, MD  Cardiologist:  Sinclair Grooms, MD *** Electrophysiologist:  None   Evaluation Performed:  {Choose Visit [id number] Visit"}  Chief Complaint:  Follow up BP  History of Present Illness:    [name] is a 70 y.o. female with a hx of HTN, CAD s/p PCI to LCx 2010 with residual disease, carotid artery disease s/p L CEA 07/2016 and R CEA 2007 (1-39% by duplex 05/2017 followed by [facility]), polycythemia vera followed by hematology, hepatitis C, tobacco abuse, COPD, GERD, anxiety, and medication noncompliance.  She was last seen in follow up 04/2019 at which time she had an episode of syncope 03/2019 and was seen by APP 04/01/2019. Echocardiogram was performed that was unremarkable. She was wearing a monitor on last evaluation with [name]. This was returned 06/05/2019 that showed NSR, SB and no arrhythmia noted. There was no AF. Carotid doppler study showed bilateral right and left 1-39% stenosis. PET scan performed 04/30/19 that showed no malignancy in the right side of the neck due to a left neck mass.   Today she presents for follow up and states that she has been doing well from a CV standpoint.  She reports she has been following whole 30 for diet plans and has lost some weight.  She reports she feels better since starting this plan.  She is almost completely eliminated sugar from her diet.  She has been exercising more.  She does continue to smoke approximately 1 to 1-1/2 packs of cigarettes per day however seems to be ready to quit.  Home BP today was elevated at 159/98 however reports that she has not yet taken her medications for the day.  She reports her mother passed away  approximately 2 months ago and has she has not been able to get back on a routine since then.  She has no specific time to take her medications.  We discussed following her BP closely for the next 5 to 7 days and keeping a close BP log.  Long discussion regarding when and how to take her BP including 1 to 2 hours after taking her antihypertensives, sitting for 15 minutes in a quiet environment, not smoking prior to taking her blood pressure and avoiding high sodium foods.  She is agreeable to follow with me in approximately 1 week.  If BP continues to be high, will make further adjustments at that time.  She denies chest pain, palpitations, shortness of breath, PND, LE edema, orthopnea, presyncopal or syncopal episodes.   The patient {does/does not:200015} have symptoms concerning for COVID-19 infection (fever, chills, cough, or new shortness of breath).     1. CAD without angina: -s/p PCI to LCx 2010 with residual disease, see cath report above -No recent anginal symptoms -Continue current regimen  2. Pre-synope: -Pt was seen 03/2019 for pre-syncope with an essentially unremarkable workup  -Echo with stable LVEF along with normal cardiac monitor and stable carotid artery Doppler study -Reports no further presyncopal symptoms since last evaluation  3. Bilateral carotid artery disease: -Last carotid doppler study performed in the setting of syncope showed no change from prior study -Continue current regimen  4. HLD: -Last LDL, 61 -Continue current regimen  5. Tobacco Use: -Reports she continues to smoke 1-1 and  half packs per day however is ready to quit -We will supply with nicotine patch 21 mg daily  6. COPD: -Ongoing tobacco use however reports readiness for cessation  7.  Hypertension: -BP elevated today however reports she is not taken her a.m. antihypertensives -Plan to follow with 5 to 7-day BP log, taking BP 1 to 2 hours after antihypertensive medications including  sitting for 15 minutes prior to BP reading, avoiding smoking near this time and continuing with a low-sodium diet. -If BP continues to be elevated, will need to address titrating medications at that time      Past Medical History:  Diagnosis Date  . Anxiety   . Arthritis   . CAD (coronary artery disease)    a. s/p LCX stent 2010 with residual disease.  . Carotid artery disease ([place])   . COPD (chronic obstructive pulmonary disease) ([place])   . Depression   . GERD (gastroesophageal reflux disease)   . Hepatitis C   . Hypertension   . Noncompliance with medication regimen   . Polycythemia   . PVD (peripheral vascular disease) ([place])   . Seizures ([place]) 1970   x1- pt. reports that there was never any causative agent     Past Surgical History:  Procedure Laterality Date  . 2 stints  Aug 26,2010   Dr. Tawnya Crook  . ABDOMINAL SURGERY  1970's   for excessive bleeding from loss of pregnancy, required blood transfusion post procedure   . CAROTID ENDARTERECTOMY  2007  . cartoid endartherectomy  2007  . CORONARY ANGIOPLASTY WITH STENT PLACEMENT  2010  . CORONARY ANGIOPLASTY WITH STENT PLACEMENT  10/09/2008  . ENDARTERECTOMY Left 07/24/2016   Procedure: ENDARTERECTOMY CAROTID;  Surgeon: [name], MD;  Location: [facility];  Service: Vascular;  Laterality: Left;  . FLEXIBLE SIGMOIDOSCOPY N/A 03/22/2017   Procedure: FLEXIBLE SIGMOIDOSCOPY;  Surgeon: Carol Ada, MD;  Location: [facility];  Service: Endoscopy;  Laterality: N/A;  . LYMPH NODE DISSECTION Left 1990's   benign   . ORIF ELBOW FRACTURE Right 04/26/2018   Procedure: OPEN REDUCTION INTERNAL FIXATION (ORIF) ELBOW/OLECRANON FRACTURE;  Surgeon: Shona Needles, MD;  Location: [facility];  Service: Orthopedics;  Laterality: Right;  . TUBAL LIGATION       No outpatient medications have been marked as taking for the 03/16/20 encounter (Appointment) with Tommie Raymond, NP.     Allergies:   Diphenhydramine, Sulfa antibiotics, and Bactrim  [sulfamethoxazole-trimethoprim]   Social History   Tobacco Use  . Smoking status: Former Smoker    Packs/day: 1.00    Years: 48.00    Pack years: 48.00    Types: Cigarettes    Start date: 03/20/1968    Quit date: 04/16/2019    Years since quitting: 0.9  . Smokeless tobacco: Never Used  Vaping Use  . Vaping Use: Some days  Substance Use Topics  . Alcohol use: Yes    Alcohol/week: 0.0 standard drinks    Comment: socially occasionally  . Drug use: No    Comment: has smoked cigarettes off and on     Family Hx: The patient's family history includes Heart disease in her father and mother.  ROS:   Please see the history of present illness.    *** All other systems reviewed and are negative.   Prior CV studies:   The following studies were reviewed today:   Monitor 06/05/19:   Normal sinus rhthm and sinus bradycardia  No arrhythmia, i.e. no atrial fibrillation.  No complaints  Echocardiogram 04/14/19:  1. Left ventricular ejection fraction, by estimation, is 60 to 65%. The  left ventricle has normal function. The left ventricle has no regional  wall motion abnormalities. Left ventricular diastolic parameters are  consistent with Grade II diastolic  dysfunction (pseudonormalization).  2. Right ventricular systolic function is normal. The right ventricular  size is normal.  3. The mitral valve is normal in structure and function. Trivial mitral  valve regurgitation. No evidence of mitral stenosis.  4. The aortic valve is normal in structure and function. Aortic valve  regurgitation is not visualized. No aortic stenosis is present.  5. The inferior vena cava is normal in size with greater than 50%  respiratory variability, suggesting right atrial pressure of 3 mmHg.    NST 10/2017 Nuclear stress EF: 52%. There was no ST segment deviation noted during stress. This is a low risk study. The left ventricular  ejection fraction is mildly decreased (45-54%).  1. EF 52%. Gating does not look ideal, would confirm EF with echo.  2. No evidence for ischemia or infarction on perfusion images.   Low risk study.   Cardiac Cath 2010 CARDIAC CATHETERIZATION  PROCEDURES: Left heart catheterization with selective coronary angiography, left ventricular angiography, and a sequential stent placed in the left circumflex coronary artery (drug eluting).  TYPE AND SITE OF ENTRY: Percutaneous right femoral artery with Angio- Seal.  CATHETERS: A 6-French JL4 left coronary catheter, 6-French right coronary catheter, 6-French pigtail ventriculographic catheter, guide catheter, a 6-French JL4 guide, Prowater guidewire, predilatation with a 2.5 x 20 mm Apex distally and a 2.5 x 12 mm Voyager proximally with stents being a 2.5 x 23 mm XIENCE stent placed distally and a 2.5 x 12 mm XIENCE stent proximally.  MEDICATION GIVEN PRIOR TO PROCEDURE: Valium 10 mg p.o.  MEDICATIONS GIVEN DURING THE PROCEDURE: Versed 4 mg IV, fentanyl 25 mcg, Plavix, Angiomax, and Ancef.  COMMENTS: The patient tolerated the procedure well.  HEMODYNAMIC DATA: The aortic pressure was 133/66, LV was 129/6-12. There was no aortic valve gradient noted on pullback.  ANGIOGRAPHIC DATA: 1. Left main coronary artery is normal. 2. The left circumflex has a long segment of a diffuse stenosis with an area in the mid section with a continuation branch and a small obtuse marginal that does not have severe obstruction. The most significant focal stenosis was within the segmental area of disease was 90%. There was diffuse 60-80% stenosis throughout this vessel. Distally, the left circumflex appeared to be relatively normal. 3. The left anterior descending was heavily calcified. There was diffuse moderate stenosis scattered throughout the left anterior descending and  diagonal systems but no greater than 30-40% narrowing. There were 2 diagonal vessels present. 4. Right coronary artery: The right coronary artery is a dominant vessel. There is 60-70% stenosis in its segment before the crux, but the stenotic area is associated with a relatively small distal vessel. There is a larger right ventricular branch that arises in the right coronary artery prior to the stenotic segments. While this is moderately severe to severe, it does have a potential of being flow limiting but is borderline.  Left ventricular angiogram was performed in the RAO position. Overall, cardiac size and silhouette were normal, global ejection fraction is 60%. Regional wall motion is normal.  ANGIOPLASTY PROCEDURE: We used a JL4 guide, the 6-French. Prowater guidewire was passed into the course of the left circumflex. We predilated it with a 2.5 x 20 mm Apex balloon in the more distal part  of the segments and then tried to pass a 23-mm stent directly beyond more proximal stenosis without predilatation. We were unable to pass the stent. We then returned and predilated the more proximal lesion with a 2.5 x 12 mm Voyager balloon.  We then returned and placed the 2.5 x 23 mm XIENCE (drug-eluting stent) in the left circumflex just distal to the 2 largest branches. This stent was inflated to 12 atmospheres with full inflation and no residual stenosis. We then returned with a 12 mm x 2.5 mm XIENCE stent and placed it proximally. We left approximately 3-5 mm between the stents that would correspond to the area where both the branch in the AV groove and a small marginal arise. This area between the 2 stents was left untreated. We inflated the more proximal XIENCE to 12 atmospheres with a nice inflation and a smooth contour.  The patient developed a chest pain with stent and balloon inflation. This was different  from the left arm discomfort and toothache that she had been experiencing that lead to the catheterization.  OVERALL IMPRESSION: 1. Severe stenosis in the left circumflex system with successful stent placement in sequential fashion. 2. There is residual moderately severe distal right coronary artery stenosis. 3. Mild to moderate atherosclerosis in the left anterior descending. 4. Normal left ventricular function.  PLAN: The patient will be managed medically and encouraged to modify cardiovascular risk factors. She will need to have followup stress testing and consideration of treatment of the right coronary artery.  Labs/Other Tests and Data Reviewed:    EKG:  {EKG/Telemetry Strips Reviewed:[phone]}  Recent Labs: 04/01/2019: Magnesium 2.0 01/01/2020: ALT 11; BUN 14; Creatinine 0.76; Hemoglobin 14.1; Platelet Count 245; Potassium 5.0; Sodium 132   Recent Lipid Panel Lab Results  Component Value Date/Time   CHOL 142 12/04/2017 12:03 PM   TRIG 95 12/04/2017 12:03 PM   HDL 62 12/04/2017 12:03 PM   CHOLHDL 2.3 12/04/2017 12:03 PM   CHOLHDL 5.1 (H) 09/16/2014 01:22 PM   LDLCALC 61 12/04/2017 12:03 PM    Wt Readings from Last 3 Encounters:  08/07/19 118 lb (53.5 kg)  05/22/19 120 lb (54.4 kg)  04/29/19 117 lb 12.8 oz (53.4 kg)     Risk Assessment/Calculations:   {Does this patient have ATRIAL FIBRILLATION?:[phone]}  Objective:    Vital Signs:  There were no vitals taken for this visit.   {[facility]Care Virtual Exam (Optional):[id number]::"VITAL SIGNS:  reviewed"}  ASSESSMENT & PLAN:    1. ***   {Are you ordering a CV Procedure (e.g. stress test, cath, DCCV, TEE, etc)?   Press F2        :[id number]}    COVID-19 Education: The signs and symptoms of COVID-19 were discussed with the patient and how to seek care for testing (follow up with PCP or arrange E-visit).  ***The importance of social distancing was discussed today.  Time:   Today, I have  spent *** minutes with the patient with telehealth technology discussing the above problems.     Medication Adjustments/Labs and Tests Ordered: Current medicines are reviewed at length with the patient today.  Concerns regarding medicines are outlined above.   Tests Ordered: No orders of the defined types were placed in this encounter.   Medication Changes: No orders of the defined types were placed in this encounter.   Follow Up:  {F/U Format:[phone]} {follow up:15908}  Signed, [name], NP  03/15/2020 5:21 AM    [facility]

## 2020-03-16 ENCOUNTER — Other Ambulatory Visit: Payer: Self-pay

## 2020-03-16 ENCOUNTER — Telehealth: Payer: Self-pay | Admitting: *Deleted

## 2020-03-16 ENCOUNTER — Telehealth: Payer: HMO | Admitting: Cardiology

## 2020-03-16 NOTE — Telephone Encounter (Signed)
  Patient Consent for Virtual Visit         [name] has provided verbal consent on 03/16/2020 for a virtual visit (video or telephone).   CONSENT FOR VIRTUAL VISIT FOR:  [name]  By participating in this virtual visit I agree to the following:  I hereby voluntarily request, consent and authorize [facility] and its employed or contracted physicians, physician assistants, nurse practitioners or other licensed health care professionals (the Practitioner), to provide me with telemedicine health care services (the "Services") as deemed necessary by the treating Practitioner. I acknowledge and consent to receive the Services by the Practitioner via telemedicine. I understand that the telemedicine visit will involve communicating with the Practitioner through live audiovisual communication technology and the disclosure of certain medical information by electronic transmission. I acknowledge that I have been given the opportunity to request an in-person assessment or other available alternative prior to the telemedicine visit and am voluntarily participating in the telemedicine visit.  I understand that I have the right to withhold or withdraw my consent to the use of telemedicine in the course of my care at any time, without affecting my right to future care or treatment, and that the Practitioner or I may terminate the telemedicine visit at any time. I understand that I have the right to inspect all information obtained and/or recorded in the course of the telemedicine visit and may receive copies of available information for a reasonable fee.  I understand that some of the potential risks of receiving the Services via telemedicine include:  Marland Kitchen Delay or interruption in medical evaluation due to technological equipment failure or disruption; . Information transmitted may not be sufficient (e.g. poor resolution of images) to allow for appropriate medical decision making  by the Practitioner; and/or  . In rare instances, security protocols could fail, causing a breach of personal health information.  Furthermore, I acknowledge that it is my responsibility to provide information about my medical history, conditions and care that is complete and accurate to the best of my ability. I acknowledge that Practitioner's advice, recommendations, and/or decision may be based on factors not within their control, such as incomplete or inaccurate data provided by me or distortions of diagnostic images or specimens that may result from electronic transmissions. I understand that the practice of medicine is not an exact science and that Practitioner makes no warranties or guarantees regarding treatment outcomes. I acknowledge that a copy of this consent can be made available to me via my patient portal ([facility]), or I can request a printed copy by calling the office of [facility].    I understand that my insurance will be billed for this visit.   I have read or had this consent read to me. . I understand the contents of this consent, which adequately explains the benefits and risks of the Services being provided via telemedicine.  . I have been provided ample opportunity to ask questions regarding this consent and the Services and have had my questions answered to my satisfaction. . I give my informed consent for the services to be provided through the use of telemedicine in my medical care

## 2020-04-05 ENCOUNTER — Inpatient Hospital Stay: Payer: HMO | Admitting: Hematology & Oncology

## 2020-04-05 ENCOUNTER — Inpatient Hospital Stay: Payer: HMO

## 2020-04-08 ENCOUNTER — Inpatient Hospital Stay (HOSPITAL_BASED_OUTPATIENT_CLINIC_OR_DEPARTMENT_OTHER): Payer: HMO | Admitting: Family

## 2020-04-08 ENCOUNTER — Other Ambulatory Visit: Payer: Self-pay

## 2020-04-08 ENCOUNTER — Telehealth: Payer: Self-pay | Admitting: Family

## 2020-04-08 ENCOUNTER — Inpatient Hospital Stay: Payer: HMO | Attending: Hematology & Oncology

## 2020-04-08 ENCOUNTER — Encounter: Payer: Self-pay | Admitting: Family

## 2020-04-08 VITALS — BP 164/70 | HR 58 | Temp 97.8°F | Resp 17 | Wt 118.4 lb

## 2020-04-08 DIAGNOSIS — Z79899 Other long term (current) drug therapy: Secondary | ICD-10-CM | POA: Diagnosis not present

## 2020-04-08 DIAGNOSIS — D500 Iron deficiency anemia secondary to blood loss (chronic): Secondary | ICD-10-CM

## 2020-04-08 DIAGNOSIS — D45 Polycythemia vera: Secondary | ICD-10-CM | POA: Insufficient documentation

## 2020-04-08 DIAGNOSIS — B1920 Unspecified viral hepatitis C without hepatic coma: Secondary | ICD-10-CM | POA: Diagnosis not present

## 2020-04-08 DIAGNOSIS — D751 Secondary polycythemia: Secondary | ICD-10-CM

## 2020-04-08 LAB — CBC WITH DIFFERENTIAL (CANCER CENTER ONLY)
Abs Immature Granulocytes: 0.02 10*3/uL (ref 0.00–0.07)
Basophils Absolute: 0 10*3/uL (ref 0.0–0.1)
Basophils Relative: 0 %
Eosinophils Absolute: 0.1 10*3/uL (ref 0.0–0.5)
Eosinophils Relative: 1 %
HCT: 43.5 % (ref 36.0–46.0)
Hemoglobin: 14.6 g/dL (ref 12.0–15.0)
Immature Granulocytes: 0 %
Lymphocytes Relative: 23 %
Lymphs Abs: 2 10*3/uL (ref 0.7–4.0)
MCH: 32.2 pg (ref 26.0–34.0)
MCHC: 33.6 g/dL (ref 30.0–36.0)
MCV: 95.8 fL (ref 80.0–100.0)
Monocytes Absolute: 0.9 10*3/uL (ref 0.1–1.0)
Monocytes Relative: 10 %
Neutro Abs: 5.5 10*3/uL (ref 1.7–7.7)
Neutrophils Relative %: 66 %
Platelet Count: 252 10*3/uL (ref 150–400)
RBC: 4.54 MIL/uL (ref 3.87–5.11)
RDW: 12 % (ref 11.5–15.5)
WBC Count: 8.4 10*3/uL (ref 4.0–10.5)
nRBC: 0 % (ref 0.0–0.2)

## 2020-04-08 LAB — CMP (CANCER CENTER ONLY)
ALT: 10 U/L (ref 0–44)
AST: 17 U/L (ref 15–41)
Albumin: 4.3 g/dL (ref 3.5–5.0)
Alkaline Phosphatase: 58 U/L (ref 38–126)
Anion gap: 6 (ref 5–15)
BUN: 14 mg/dL (ref 8–23)
CO2: 33 mmol/L — ABNORMAL HIGH (ref 22–32)
Calcium: 9.7 mg/dL (ref 8.9–10.3)
Chloride: 94 mmol/L — ABNORMAL LOW (ref 98–111)
Creatinine: 0.75 mg/dL (ref 0.44–1.00)
GFR, Estimated: >60 mL/min (ref >60)
Glucose, Bld: 109 mg/dL — ABNORMAL HIGH (ref 70–99)
Potassium: 4.7 mmol/L (ref 3.5–5.1)
Sodium: 133 mmol/L — ABNORMAL LOW (ref 135–145)
Total Bilirubin: 0.7 mg/dL (ref 0.3–1.2)
Total Protein: 6.8 g/dL (ref 6.5–8.1)

## 2020-04-08 NOTE — Telephone Encounter (Signed)
Appointments scheduled patient has My Chart and will get updated appt info there per 2/17 los

## 2020-04-08 NOTE — Progress Notes (Signed)
Hematology and Oncology Follow Up Visit  [name] [id number] February 21, 1950 70 y.o. 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 Hct stable at 43.5. She is grieving the loss of her mother. She is also waiting to see whether she will stay in her home or move.  This has been stressful for her.  She states that she does not have an appetite but is making herself eat. She is trying to stay hydrated. Her weight is stable at 118.  She has nausea, no vomiting.  No fever, chills, cough, rash, dizziness, palpitations, abdominal pain or changes in bowel habits.  She has urinary leakage and plans to follow-up with her gynecologist.  She has occasional brief episodes of chest discomfort. She states that this resolved without intervention and she has not had to go to the ED.  She notes SOB with exertion and is still smoking 1 ppd.  No swelling in her extremities at this time.  She does have numbness and tingling in her hands but more prominent in the right hand. She also has positional numbness or tingling in her lower extremities. This resolves once she moves around.   ECOG Performance Status: 1 - Symptomatic but completely ambulatory  Medications:  Allergies as of 04/08/2020      Reactions   Diphenhydramine Hives   Sulfa Antibiotics Nausea And Vomiting   Bactrim [sulfamethoxazole-trimethoprim] Nausea And Vomiting      Medication List       Accurate as of April 08, 2020  1:32 PM. If you have any questions, ask your nurse or doctor.        acetaminophen 325 MG tablet Commonly known as: TYLENOL Take 325-650 mg by mouth every 6 (six) hours as needed for mild pain or headache.   albuterol  108 (90 Base) MCG/ACT inhaler Commonly known as: VENTOLIN HFA Inhale 2 puffs into the lungs every 6 (six) hours as needed for wheezing or shortness of breath.   ALPRAZolam 0.5 MG tablet Commonly known as: XANAX Take 0.5 mg by mouth at bedtime.   Biotin 1000 MCG tablet Take 1,000 mcg by mouth daily.   Breo Ellipta 100-25 MCG/INH Aepb Generic drug: fluticasone furoate-vilanterol INHALE ONE PUFF ONCE A DAY   clopidogrel 75 MG tablet Commonly known as: PLAVIX Take 1 tablet by mouth once daily   clotrimazole-betamethasone cream Commonly known as: LOTRISONE Apply 1 application topically 2 (two) times daily as needed (to affected area).   escitalopram 10 MG tablet Commonly known as: LEXAPRO Take 10 mg by mouth daily.   hydrochlorothiazide 25 MG tablet Commonly known as: HYDRODIURIL Take 1 tablet (25 mg total) by mouth daily.   lansoprazole 30 MG capsule Commonly known as: PREVACID Take 30 mg by mouth daily as needed (for ulcer/acid reflex). Ulcer/ acid reflux   metoprolol succinate 100 MG 24 hr tablet Commonly known as: TOPROL-XL TAKE 1 & 1/2 (ONE & ONE-HALF) TABLETS BY MOUTH ONCE DAILY . Please make yearly appt with [name] for May for future refills. 1st attempt   nitroGLYCERIN 0.4 MG SL tablet Commonly known as: NITROSTAT 1 tablet under the tongue   rosuvastatin 10 MG tablet Commonly known as: CRESTOR Take 1 tablet (10 mg total) by mouth  every other day.   telmisartan 20 MG tablet Commonly known as: MICARDIS Take 1 tablet by mouth once daily   vitamin C 500 MG tablet Commonly known as: ASCORBIC ACID Take 500-1,000 mg by mouth daily.   VITAMIN C PO Take 2 tablets by mouth daily.   vitamin E 180 MG (400 UNITS) capsule Take 400 Units by mouth daily.       Allergies:  Allergies  Allergen Reactions  . Diphenhydramine Hives  . Sulfa Antibiotics Nausea And Vomiting  . Bactrim [Sulfamethoxazole-Trimethoprim] Nausea And Vomiting    Past Medical History,  Surgical history, Social history, and Family History were reviewed and updated.  Review of Systems: All other 10 point review of systems is negative.   Physical Exam:  weight is 118 lb 6.4 oz (53.7 kg). Her oral temperature is 97.8 F (36.6 C). Her blood pressure is 164/70 (abnormal) and her pulse is 58 (abnormal). Her respiration is 17 and oxygen saturation is 99%.   Wt Readings from Last 3 Encounters:  04/08/20 118 lb 6.4 oz (53.7 kg)  08/07/19 118 lb (53.5 kg)  05/22/19 120 lb (54.4 kg)    Ocular: Sclerae unicteric, pupils equal, round and reactive to light Ear-nose-throat: Oropharynx clear, dentition fair Lymphatic: No cervical or supraclavicular adenopathy Lungs no rales or rhonchi, good excursion bilaterally Heart regular rate and rhythm, no murmur appreciated Abd soft, nontender, positive bowel sounds MSK no focal spinal tenderness, no joint edema Neuro: non-focal, well-oriented, appropriate affect Breasts: Deferred   Lab Results  Component Value Date   WBC 8.4 04/08/2020   HGB 14.6 04/08/2020   HCT 43.5 04/08/2020   MCV 95.8 04/08/2020   PLT 252 04/08/2020   Lab Results  Component Value Date   FERRITIN 64 01/01/2020   IRON 101 01/01/2020   TIBC 453 (H) 01/01/2020   UIBC 351 01/01/2020   IRONPCTSAT 22 01/01/2020   Lab Results  Component Value Date   RETICCTPCT 1.2 08/03/2010   RBC 4.54 04/08/2020   RETICCTABS 61.3 08/03/2010   No results found for: KPAFRELGTCHN, LAMBDASER, KAPLAMBRATIO No results found for: IGGSERUM, IGA, IGMSERUM No results found for: Kathrynn Ducking, MSPIKE, SPEI   Chemistry      Component Value Date/Time   NA 132 (L) 01/01/2020 1210   NA 137 04/01/2019 1649   NA 139 01/03/2017 1327   NA 136 12/28/2015 1311   K 5.0 01/01/2020 1210   K 3.7 01/03/2017 1327   K 4.9 12/28/2015 1311   CL 95 (L) 01/01/2020 1210   CL 94 (L) 01/03/2017 1327   CO2 31 01/01/2020 1210   CO2 31 01/03/2017 1327    CO2 28 12/28/2015 1311   BUN 14 01/01/2020 1210   BUN 14 04/01/2019 1649   BUN 12 01/03/2017 1327   BUN 10.4 12/28/2015 1311   CREATININE 0.76 01/01/2020 1210   CREATININE 1.0 01/03/2017 1327   CREATININE 0.9 12/28/2015 1311      Component Value Date/Time   CALCIUM 9.8 01/01/2020 1210   CALCIUM 8.8 01/03/2017 1327   CALCIUM 9.6 12/28/2015 1311   ALKPHOS 68 01/01/2020 1210   ALKPHOS 85 (H) 01/03/2017 1327   ALKPHOS 83 12/28/2015 1311   AST 20 01/01/2020 1210   AST 25 12/28/2015 1311   ALT 11 01/01/2020 1210   ALT 19 01/03/2017 1327   ALT 13 12/28/2015 1311   BILITOT 0.8 01/01/2020 1210   BILITOT 0.74 12/28/2015 1311       Impression and Plan:  [name] is a 70 yo caucasian female with polycythemia.  Hct is stable at 43.5%, no phlebotomy needed this visit.  Follow-up in 2 months.  She can contact our office with any questions or concerns. We can certainly see her sooner if needed.   Laverna Peace, NP 2/17/20221:32 PM

## 2020-04-09 LAB — IRON AND TIBC
Iron: 99 ug/dL (ref 41–142)
Saturation Ratios: 25 % (ref 21–57)
TIBC: 390 ug/dL (ref 236–444)
UIBC: 291 ug/dL (ref 120–384)

## 2020-04-09 LAB — FERRITIN: Ferritin: 36 ng/mL (ref 11–307)

## 2020-04-19 ENCOUNTER — Telehealth: Payer: Self-pay | Admitting: *Deleted

## 2020-04-19 NOTE — Telephone Encounter (Signed)
   Primary Cardiologist: Sinclair Grooms, MD  Chart reviewed as part of pre-operative protocol coverage.  History: - CAD s/p Xience DES x 2 to LCx in 2010 (no MI) - Carotid artery dz s/p L CEA in 6/18; R CEA in 2007 - Polycythemia vera - Hep C - COPD, tobacco use - GERD - Anxiety  - HTN - HLD - 04/08/2020: Creatinine 0.75   Carotid US 04/2019: Bilateral ICA 1-39% Echocardiogram 04/14/19: EF 60-65; no sig valve dz Myoview 11/07/17: EF 52, no ischemia  Last OV: 03/10/20 with [name], NP via Telemedicine  Patient was contacted 04/19/2020 in reference to pre-operative risk assessment for pending surgery as outlined below.   The patient notes that her BP is uncontrolled with systolic readings in the 493X.  She also notes occasional chest tightness (not necessarily with exertion) and dyspnea on exertion.  Due to new or worsening symptoms, [name] will require a follow-up visit for further pre-operative risk assessment.  Pre-op covering staff: - Please scheduled appt in the office for evaluation of chest pain, hypertension and for surgical clearance. - The pt noted she had not seen [name] in a long time and preferred to see him. - Please contact requesting surgeon's office via preferred method (i.e, phone, fax) to inform them of need for appointment prior to surgery.  [name], PA-C 04/19/2020, 12:56 PM

## 2020-04-19 NOTE — Telephone Encounter (Signed)
S/w pt and offered her appt at our [place] office as nothing at [facility] location. Offered a number of appts. Pt then agreed to the 04/26/20 @ 2:15 with Kerin Ransom, PAC. I will send notes to PA for upcoming appt. I will send FYI to requesting office the pt needs appt.

## 2020-04-19 NOTE — Telephone Encounter (Signed)
   [facility] Pre-operative Risk Assessment    HEARTCARE STAFF: - Please ensure there is not already an duplicate clearance open for this procedure. - Under Visit Info/Reason for Call, type in Other and utilize the format Clearance MM/DD/YY or Clearance TBD. Do not use dashes or single digits. - If request is for dental extraction, please clarify the # of teeth to be extracted.  Request for surgical clearance:  1. What type of surgery is being performed? 9 TEETH TO BE EXTRACTED AND 4 DENTAL IMPLANTS   2. When is this surgery scheduled? TBD   3. What type of clearance is required (medical clearance vs. Pharmacy clearance to hold med vs. Both)? MEDICAL  4. Are there any medications that need to be held prior to surgery and how long? PLAVIX    5. Practice name and name of physician performing surgery? [facility]; DR. Iven Finn, DMD   6. What is the office phone number? [phone]   7.   What is the office fax number? [phone]  8.   Anesthesia type (None, local, MAC, general) ? IV SEDATION   [name] 04/19/2020, 11:08 AM  _________________________________________________________________   (provider comments below)

## 2020-04-20 ENCOUNTER — Ambulatory Visit: Payer: HMO | Admitting: Cardiology

## 2020-04-20 NOTE — Telephone Encounter (Signed)
Pt sees you 04/26/20.  Please send clearance notes to surgeon. [name], PA-C    04/20/2020 2:53 PM

## 2020-04-26 ENCOUNTER — Encounter: Payer: Self-pay | Admitting: Cardiology

## 2020-04-26 ENCOUNTER — Other Ambulatory Visit: Payer: Self-pay

## 2020-04-26 ENCOUNTER — Ambulatory Visit: Payer: HMO | Admitting: Cardiology

## 2020-04-26 ENCOUNTER — Ambulatory Visit (INDEPENDENT_AMBULATORY_CARE_PROVIDER_SITE_OTHER): Payer: HMO | Admitting: Cardiology

## 2020-04-26 VITALS — BP 140/80 | HR 60 | Ht 60.0 in | Wt 116.8 lb

## 2020-04-26 DIAGNOSIS — E785 Hyperlipidemia, unspecified: Secondary | ICD-10-CM

## 2020-04-26 DIAGNOSIS — Z9861 Coronary angioplasty status: Secondary | ICD-10-CM | POA: Diagnosis not present

## 2020-04-26 DIAGNOSIS — R0600 Dyspnea, unspecified: Secondary | ICD-10-CM | POA: Diagnosis not present

## 2020-04-26 DIAGNOSIS — I6523 Occlusion and stenosis of bilateral carotid arteries: Secondary | ICD-10-CM | POA: Diagnosis not present

## 2020-04-26 DIAGNOSIS — D45 Polycythemia vera: Secondary | ICD-10-CM

## 2020-04-26 DIAGNOSIS — I2510 Atherosclerotic heart disease of native coronary artery without angina pectoris: Secondary | ICD-10-CM | POA: Diagnosis not present

## 2020-04-26 DIAGNOSIS — I10 Essential (primary) hypertension: Secondary | ICD-10-CM

## 2020-04-26 DIAGNOSIS — Z789 Other specified health status: Secondary | ICD-10-CM | POA: Diagnosis not present

## 2020-04-26 DIAGNOSIS — Z87898 Personal history of other specified conditions: Secondary | ICD-10-CM

## 2020-04-26 DIAGNOSIS — J449 Chronic obstructive pulmonary disease, unspecified: Secondary | ICD-10-CM

## 2020-04-26 DIAGNOSIS — Z01818 Encounter for other preprocedural examination: Secondary | ICD-10-CM

## 2020-04-26 DIAGNOSIS — R0609 Other forms of dyspnea: Secondary | ICD-10-CM

## 2020-04-26 NOTE — Assessment & Plan Note (Signed)
She declines to check her B/P at home - "it just makes me nervous"

## 2020-04-26 NOTE — Assessment & Plan Note (Signed)
Based on the patient's history of CAD, multiple cardiac risk factors, and history of intermittent NTG use and persistent DOE I would like to proceed with a Lexiscan Myoview prior to granting clearance.  If low risk she would be an acceptable risk for the procedure.  She is not on aspirin.  OK to hold plavix 5 -7 days pre op if needed.

## 2020-04-26 NOTE — Assessment & Plan Note (Signed)
Daily smoker- 1-1.5 PPD

## 2020-04-26 NOTE — Progress Notes (Signed)
Cardiology Office Note:    Date:  04/26/2020   ID:  [name], DOB 10/06/1950, MRN [id number]  PCP:  Wenda Low, MD  Cardiologist:  Sinclair Grooms, MD  Electrophysiologist:  None   Referring MD: Wenda Low, MD   No chief complaint on file. pre op clearance  History of Present Illness:    [name] is a 70 y.o. female with a hx of CAD, status post circumflex PCI in 2010.  She had a low risk Myoview in September 2019.  She also has vascular disease and had a left carotid endarterectomy in 2018 and a right carotid endarterectomy in 2007.  Other medical issues include smoking and COPD, essential hypertension which has been labile, polycythemia vera followed by Dr. Marin Olp, and a history of syncope in February 2021.  In February 2021 her echocardiogram was essentially normal and her monitor was unremarkable.  The patient was contacted for routine follow-up for virtual visit March 10, 2020.  Her blood pressure was poorly controlled.  The patient now needs fairly extensive dental work done and is here for preop clearance.  She tells me she does have dyspnea on exertion.  She is really unable to exercise.  Even going up steps makes her short of breath.  Unfortunately though she had quit smoking in the past she now is smoking at least a pack a day again.  She does plan to quit.  She has chest pain off and on and uses a nitroglycerin but, she has not used nitroglycerin in the last month or so.   Past Medical History:  Diagnosis Date  . Anxiety   . Arthritis   . CAD (coronary artery disease)    a. s/p LCX stent 2010 with residual disease.  . Carotid artery disease ([place])   . COPD (chronic obstructive pulmonary disease) ([place])   . Depression   . GERD (gastroesophageal reflux disease)   . Hepatitis C   . Hypertension   . Noncompliance with medication regimen   . Polycythemia   . PVD (peripheral vascular disease) ([place])   . Seizures ([place]) 1970   x1- pt.  reports that there was never any causative agent      Past Surgical History:  Procedure Laterality Date  . 2 stints  Aug 26,2010   Dr. Tawnya Crook  . ABDOMINAL SURGERY  1970's   for excessive bleeding from loss of pregnancy, required blood transfusion post procedure   . CAROTID ENDARTERECTOMY  2007  . cartoid endartherectomy  2007  . CORONARY ANGIOPLASTY WITH STENT PLACEMENT  2010  . CORONARY ANGIOPLASTY WITH STENT PLACEMENT  10/09/2008  . ENDARTERECTOMY Left 07/24/2016   Procedure: ENDARTERECTOMY CAROTID;  Surgeon: [name], MD;  Location: [place];  Service: Vascular;  Laterality: Left;  . FLEXIBLE SIGMOIDOSCOPY N/A 03/22/2017   Procedure: FLEXIBLE SIGMOIDOSCOPY;  Surgeon: Carol Ada, MD;  Location: [place];  Service: Endoscopy;  Laterality: N/A;  . LYMPH NODE DISSECTION Left 1990's   benign   . ORIF ELBOW FRACTURE Right 04/26/2018   Procedure: OPEN REDUCTION INTERNAL FIXATION (ORIF) ELBOW/OLECRANON FRACTURE;  Surgeon: Shona Needles, MD;  Location: [place];  Service: Orthopedics;  Laterality: Right;  . TUBAL LIGATION      Current Medications: Current Meds  Medication Sig  . acetaminophen (TYLENOL) 325 MG tablet Take 325-650 mg by mouth every 6 (six) hours as needed for mild pain or headache.  . ALPRAZolam (XANAX) 0.5 MG tablet Take 0.5 mg by mouth at bedtime.   Marland Kitchen  Ascorbic Acid (VITAMIN C PO) Take 2 tablets by mouth daily.  . Biotin 1000 MCG tablet Take 1,000 mcg by mouth daily.  Marland Kitchen BREO ELLIPTA 100-25 MCG/INH AEPB INHALE ONE PUFF ONCE A DAY  . clopidogrel (PLAVIX) 75 MG tablet Take 1 tablet by mouth once daily  . clotrimazole-betamethasone (LOTRISONE) cream Apply 1 application topically 2 (two) times daily as needed (to affected area).  . escitalopram (LEXAPRO) 10 MG tablet Take 5 mg by mouth daily. Patient is taking 5 MG daily  . lansoprazole (PREVACID) 30 MG capsule Take 30 mg by mouth daily as needed (for ulcer/acid reflex). Ulcer/ acid reflux  . metoprolol succinate  (TOPROL-XL) 100 MG 24 hr tablet TAKE 1 & 1/2 (ONE & ONE-HALF) TABLETS BY MOUTH ONCE DAILY . Please make yearly appt with [name] for May for future refills. 1st attempt  . nitroGLYCERIN (NITROSTAT) 0.4 MG SL tablet   . rosuvastatin (CRESTOR) 10 MG tablet Take 1 tablet (10 mg total) by mouth every other day.  . telmisartan (MICARDIS) 20 MG tablet Take 1 tablet by mouth once daily  . vitamin C (ASCORBIC ACID) 500 MG tablet Take 500-1,000 mg by mouth daily.  . vitamin E 180 MG (400 UNITS) capsule Take 400 Units by mouth daily.  . [DISCONTINUED] albuterol (VENTOLIN HFA) 108 (90 Base) MCG/ACT inhaler Inhale 2 puffs into the lungs every 6 (six) hours as needed for wheezing or shortness of breath.     Allergies:   Diphenhydramine, Sulfa antibiotics, and Bactrim [sulfamethoxazole-trimethoprim]   Social History   Socioeconomic History  . Marital status: Single    Spouse name: Not on file  . Number of children: Not on file  . Years of education: Not on file  . Highest education level: Not on file  Occupational History  . Not on file  Tobacco Use  . Smoking status: Former Smoker    Packs/day: 1.00    Years: 48.00    Pack years: 48.00    Types: Cigarettes    Start date: 03/20/1968    Quit date: 04/16/2019    Years since quitting: 1.0  . Smokeless tobacco: Never Used  Vaping Use  . Vaping Use: Some days  Substance and Sexual Activity  . Alcohol use: Yes    Alcohol/week: 0.0 standard drinks    Comment: socially occasionally  . Drug use: No    Comment: has smoked cigarettes off and on  . Sexual activity: Not Currently  Other Topics Concern  . Not on file  Social History Narrative   ** Merged History Encounter **       Tobacco use cigarettes: Current smoker Frequency 1 PPD Estimated Pack - years :30 Smoking : yes  No Tobacco exposure No alcohol No exercise Occupation : employed, works at home for [employer], Health visitor   n Boys 1 Girls  1   Social Determinants of Radio broadcast assistant Strain: Not on file  Food Insecurity: No Food Insecurity  . Worried About Charity fundraiser in the Last Year: Never true  . Ran Out of Food in the Last Year: Never true  Transportation Needs: Not on file  Physical Activity: Not on file  Stress: Not on file  Social Connections: Not on file     Family History: The patient's family history includes Heart disease in her father and mother.  ROS:   Please see the history of present illness.     All other systems reviewed and are  negative.  EKGs/Labs/Other Studies Reviewed:    The following studies were reviewed today: Myoview 2019-  Nuclear stress EF: 52%.  There was no ST segment deviation noted during stress.  This is a low risk study.  The left ventricular ejection fraction is mildly decreased (45-54%).   1. EF 52%.  Gating does not look ideal, would confirm EF with echo.  2. No evidence for ischemia or infarction on perfusion images.   Low risk study.    EKG:  EKG is ordered today.  The ekg ordered today demonstrates NSR, HR 60  Recent Labs: 04/08/2020: ALT 10; BUN 14; Creatinine 0.75; Hemoglobin 14.6; Platelet Count 252; Potassium 4.7; Sodium 133  Recent Lipid Panel    Component Value Date/Time   CHOL 142 12/04/2017 1203   TRIG 95 12/04/2017 1203   HDL 62 12/04/2017 1203   CHOLHDL 2.3 12/04/2017 1203   CHOLHDL 5.1 (H) 09/16/2014 1322   VLDL 37 (H) 09/16/2014 1322   LDLCALC 61 12/04/2017 1203    Physical Exam:    VS:  BP 140/80   Pulse 60   Ht 5' (1.524 m)   Wt 116 lb 12.8 oz (53 kg)   BMI 22.81 kg/m     Wt Readings from Last 3 Encounters:  04/26/20 116 lb 12.8 oz (53 kg)  04/08/20 118 lb 6.4 oz (53.7 kg)  08/07/19 118 lb (53.5 kg)     GEN: Well nourished, well developed in no acute distress HEENT: Normal NECK: No JVD; No carotid bruits CARDIAC: RRR, no murmurs, rubs, gallops RESPIRATORY:  Clear to auscultation without rales, wheezing or  rhonchi  ABDOMEN: Soft, non-tender, non-distended MUSCULOSKELETAL:  No edema; No deformity  SKIN: Warm and dry NEUROLOGIC:  Alert and oriented x 3 PSYCHIATRIC:  Normal affect   ASSESSMENT:    Pre-op evaluation Based on the patient's history of CAD, multiple cardiac risk factors, and history of intermittent NTG use and persistent DOE I would like to proceed with a Lexiscan Myoview prior to granting clearance.  If low risk she would be an acceptable risk for the procedure.  She is not on aspirin.  OK to hold plavix 5 -7 days pre op if needed.  CAD S/P percutaneous coronary angioplasty CFX PCI 2010-low risk Myoview Sept 2019  COPD (chronic obstructive pulmonary disease) (HCC) Daily smoker- 1-1.5 PPD  Essential hypertension She declines to check her B/P at home - "it just makes me nervous"  Hyperlipidemia LDL goal <70 LDL 61- 2019  PLAN:    Myoview - pre op clearance pending this result.    Medication Adjustments/Labs and Tests Ordered: Current medicines are reviewed at length with the patient today.  Concerns regarding medicines are outlined above.  Orders Placed This Encounter  Procedures  . Cardiac Stress Test: Informed Consent Details: Physician/Practitioner Attestation; Transcribe to consent form and obtain patient signature  . MYOCARDIAL PERFUSION IMAGING  . EKG 12-Lead   No orders of the defined types were placed in this encounter.   Patient Instructions  Medication Instructions:  The current medical regimen is effective;  continue present plan and medications as directed. Please refer to the Current Medication list given to you today.  *If you need a refill on your cardiac medications before your next appointment, please call your pharmacy*  Lab Work:     NONE      Testing/Procedures: Your physician has requested that you have a lexiscan myoview. A cardiac stress test is a cardiological test that measures the heart's ability to respond  to external stress in a  controlled clinical environment. The stress response is induced by intravenous pharmacological stimulation.   Follow-Up: Your next appointment:  6 month(s) In Person with You may see Sinclair Grooms, MD or one of the following Advanced Practice Providers on your designated Care Team:  [name], NP  Please call our office 2 months in advance to schedule this appointment   At [facility], you and your health needs are our priority.  As part of our continuing mission to provide you with exceptional heart care, we have created designated Provider Care Teams.  These Care Teams include your primary Cardiologist (physician) and Advanced Practice Providers (APPs -  Physician Assistants and Nurse Practitioners) who all work together to provide you with the care you need, when you need it.     Signed, Kerin Ransom, PA-C  04/26/2020 2:52 PM    [facility]

## 2020-04-26 NOTE — Assessment & Plan Note (Signed)
LDL 61- 2019

## 2020-04-26 NOTE — Patient Instructions (Signed)
Medication Instructions:  The current medical regimen is effective;  continue present plan and medications as directed. Please refer to the Current Medication list given to you today.  *If you need a refill on your cardiac medications before your next appointment, please call your pharmacy*  Lab Work:     NONE      Testing/Procedures: Your physician has requested that you have a lexiscan myoview. A cardiac stress test is a cardiological test that measures the heart's ability to respond to external stress in a controlled clinical environment. The stress response is induced by intravenous pharmacological stimulation.   Follow-Up: Your next appointment:  6 month(s) In Person with You may see Sinclair Grooms, MD or one of the following Advanced Practice Providers on your designated Care Team:  [name], NP  Please call our office 2 months in advance to schedule this appointment   At [facility], you and your health needs are our priority.  As part of our continuing mission to provide you with exceptional heart care, we have created designated Provider Care Teams.  These Care Teams include your primary Cardiologist (physician) and Advanced Practice Providers (APPs -  Physician Assistants and Nurse Practitioners) who all work together to provide you with the care you need, when you need it.

## 2020-04-26 NOTE — Assessment & Plan Note (Signed)
CFX PCI 2010-low risk Myoview Sept 2019

## 2020-04-27 ENCOUNTER — Inpatient Hospital Stay: Payer: HMO | Admitting: Family

## 2020-05-04 ENCOUNTER — Telehealth (HOSPITAL_COMMUNITY): Payer: Self-pay | Admitting: *Deleted

## 2020-05-04 NOTE — Telephone Encounter (Signed)
Close encounter 

## 2020-05-05 ENCOUNTER — Ambulatory Visit (HOSPITAL_COMMUNITY)
Admission: RE | Admit: 2020-05-05 | Discharge: 2020-05-05 | Disposition: A | Discharge status: Home / Self Care | Payer: HMO | Source: Ambulatory Visit | Attending: Cardiovascular Disease | Admitting: Cardiovascular Disease

## 2020-05-05 ENCOUNTER — Other Ambulatory Visit: Payer: Self-pay

## 2020-05-05 DIAGNOSIS — I2510 Atherosclerotic heart disease of native coronary artery without angina pectoris: Secondary | ICD-10-CM | POA: Diagnosis not present

## 2020-05-05 DIAGNOSIS — R0600 Dyspnea, unspecified: Secondary | ICD-10-CM | POA: Diagnosis not present

## 2020-05-05 DIAGNOSIS — J449 Chronic obstructive pulmonary disease, unspecified: Secondary | ICD-10-CM

## 2020-05-05 DIAGNOSIS — Z789 Other specified health status: Secondary | ICD-10-CM | POA: Diagnosis not present

## 2020-05-05 DIAGNOSIS — Z9861 Coronary angioplasty status: Secondary | ICD-10-CM | POA: Diagnosis not present

## 2020-05-05 DIAGNOSIS — R0609 Other forms of dyspnea: Secondary | ICD-10-CM

## 2020-05-05 LAB — MYOCARDIAL PERFUSION IMAGING
LV dias vol: 89 mL (ref 46–106)
LV sys vol: 48 mL
Peak HR: 81 {beats}/min
Rest HR: 53 {beats}/min
SDS: 0
SRS: 2
SSS: 2
TID: 1.03

## 2020-05-05 MED ORDER — AMINOPHYLLINE 25 MG/ML IV SOLN
75.0000 mg | Freq: Once | INTRAVENOUS | Status: AC
  Administered 2020-05-05: 75 mg via INTRAVENOUS

## 2020-05-05 MED ORDER — TECHNETIUM TC 99M TETROFOSMIN IV KIT
10.4000 | PACK | Freq: Once | INTRAVENOUS | Status: AC | PRN
  Administered 2020-05-05: 10.4 via INTRAVENOUS
  Filled 2020-05-05: qty 11

## 2020-05-05 MED ORDER — REGADENOSON 0.4 MG/5ML IV SOLN
0.4000 mg | Freq: Once | INTRAVENOUS | Status: AC
  Administered 2020-05-05: 0.4 mg via INTRAVENOUS

## 2020-05-05 MED ORDER — TECHNETIUM TC 99M TETROFOSMIN IV KIT
30.2000 | PACK | Freq: Once | INTRAVENOUS | Status: AC | PRN
  Administered 2020-05-05: 30.2 via INTRAVENOUS
  Filled 2020-05-05: qty 31

## 2020-05-27 NOTE — Telephone Encounter (Signed)
   Patient Name: [name]  DOB: 1951/01/09  MRN: [id number]   Primary Cardiologist: Sinclair Grooms, MD  Chart reviewed as part of pre-operative protocol coverage. Given past medical history and time since last visit, based on ACC/AHA guidelines, [name] would be at acceptable risk for the planned procedure without further cardiovascular testing.  Recent nuclear stress test was reassuring without sign of reversible blockage.  Patient is okay to hold Plavix for 5 days prior to the procedure and restart as soon as possible after the surgery at the surgeon's discretion.  She does not have prior history of valve surgery, congenital heart disease or history of endocarditis and does not need SBE prophylaxis.  The patient was advised that if she develops new symptoms prior to surgery to contact our office to arrange for a follow-up visit, and she verbalized understanding.  I will route this recommendation to the requesting party via Epic fax function and remove from pre-op pool.  Please call with questions.  [place], [place] 05/27/2020, 3:46 PM

## 2020-05-31 ENCOUNTER — Other Ambulatory Visit: Payer: Self-pay | Admitting: Internal Medicine

## 2020-06-03 ENCOUNTER — Ambulatory Visit: Payer: HMO | Admitting: *Deleted

## 2020-06-07 ENCOUNTER — Telehealth: Payer: Self-pay

## 2020-06-07 ENCOUNTER — Inpatient Hospital Stay: Payer: HMO

## 2020-06-07 ENCOUNTER — Inpatient Hospital Stay: Payer: HMO | Admitting: Family

## 2020-06-07 NOTE — Telephone Encounter (Signed)
Returned pts call to r/s todays appt as she has a sinus inf, pt declined to come in for next avail appt as it is early am, pt was then given next avail pm appt per her req   [name]

## 2020-07-02 DIAGNOSIS — J449 Chronic obstructive pulmonary disease, unspecified: Secondary | ICD-10-CM | POA: Diagnosis not present

## 2020-07-02 DIAGNOSIS — I700 Atherosclerosis of aorta: Secondary | ICD-10-CM | POA: Diagnosis not present

## 2020-07-02 DIAGNOSIS — R7303 Prediabetes: Secondary | ICD-10-CM | POA: Diagnosis not present

## 2020-07-02 DIAGNOSIS — E782 Mixed hyperlipidemia: Secondary | ICD-10-CM | POA: Diagnosis not present

## 2020-07-02 DIAGNOSIS — F330 Major depressive disorder, recurrent, mild: Secondary | ICD-10-CM | POA: Diagnosis not present

## 2020-07-02 DIAGNOSIS — I779 Disorder of arteries and arterioles, unspecified: Secondary | ICD-10-CM | POA: Diagnosis not present

## 2020-07-02 DIAGNOSIS — Z0000 Encounter for general adult medical examination without abnormal findings: Secondary | ICD-10-CM | POA: Diagnosis not present

## 2020-07-02 DIAGNOSIS — Z1389 Encounter for screening for other disorder: Secondary | ICD-10-CM | POA: Diagnosis not present

## 2020-07-02 DIAGNOSIS — D45 Polycythemia vera: Secondary | ICD-10-CM | POA: Diagnosis not present

## 2020-07-02 DIAGNOSIS — I10 Essential (primary) hypertension: Secondary | ICD-10-CM | POA: Diagnosis not present

## 2020-07-02 DIAGNOSIS — K219 Gastro-esophageal reflux disease without esophagitis: Secondary | ICD-10-CM | POA: Diagnosis not present

## 2020-07-02 DIAGNOSIS — I2510 Atherosclerotic heart disease of native coronary artery without angina pectoris: Secondary | ICD-10-CM | POA: Diagnosis not present

## 2020-07-02 DIAGNOSIS — E871 Hypo-osmolality and hyponatremia: Secondary | ICD-10-CM | POA: Diagnosis not present

## 2020-07-05 ENCOUNTER — Inpatient Hospital Stay: Payer: HMO | Attending: Hematology & Oncology

## 2020-07-05 ENCOUNTER — Inpatient Hospital Stay: Payer: HMO | Admitting: Family

## 2020-07-09 ENCOUNTER — Telehealth: Payer: Self-pay

## 2020-07-09 NOTE — Telephone Encounter (Signed)
Pt states that she called the morning of 5/16 to chk appt and we did not call her back and that is why she didn't come in.  States she had blood work last thurs 5/12 with pcp and her hemat was 45.2.  She states she needs a phlebot and wants it sch with Tim at [facility].  If we order a phlebot when will she need to r/s with [place]?  [name]

## 2020-07-10 ENCOUNTER — Other Ambulatory Visit: Payer: Self-pay | Admitting: Interventional Cardiology

## 2020-07-20 ENCOUNTER — Other Ambulatory Visit: Payer: Self-pay | Admitting: Interventional Cardiology

## 2020-07-22 ENCOUNTER — Telehealth: Payer: Self-pay | Admitting: Family

## 2020-07-22 ENCOUNTER — Telehealth: Payer: Self-pay

## 2020-07-22 NOTE — Telephone Encounter (Signed)
Appt was made at [facility] with Tim for port flush per pt req.  Bethany and I both call the pt and are unable to leave a vm.  A calendar has been mailed to the pt   [name]

## 2020-07-22 NOTE — Telephone Encounter (Signed)
Scheduled appt per 6/1 sch msg. Called pt, no answer. No voicemail available. Pt does have MyChart, appt put in there.

## 2020-07-29 ENCOUNTER — Other Ambulatory Visit: Payer: Self-pay

## 2020-07-29 ENCOUNTER — Inpatient Hospital Stay: Payer: HMO | Attending: Hematology and Oncology

## 2020-07-29 VITALS — BP 138/73 | HR 58 | Temp 98.3°F | Resp 16

## 2020-07-29 DIAGNOSIS — D45 Polycythemia vera: Secondary | ICD-10-CM | POA: Insufficient documentation

## 2020-07-29 MED ORDER — SODIUM CHLORIDE 0.9 % IV SOLN
INTRAVENOUS | Status: DC
  Filled 2020-07-29: qty 250

## 2020-07-29 MED ORDER — SODIUM CHLORIDE 0.9 % IV SOLN
INTRAVENOUS | Status: DC
  Filled 2020-07-29 (×2): qty 250

## 2020-07-29 NOTE — Progress Notes (Signed)
Per Seth Bake, RN, patient passed out post phlebotomy and was hypotensive. This RN monitored patient and took orthostatic vital signs. Upon departure, patient denied feeling dizzy and vitals were stable. Patient was ambulatory upon leaving infusion room.

## 2020-07-29 NOTE — Patient Instructions (Signed)
Therapeutic Phlebotomy Therapeutic phlebotomy is the planned removal of blood from a person's body for the purpose of treating a medical condition. The procedure is similar to donating blood. Usually, about a pint (470 mL, or 0.47 L) of blood is removed. The average adult has 9-12 pints (4.3-5.7 L) of blood in the body. Therapeutic phlebotomy may be used to treat the following medical conditions:  Hemochromatosis. This is a condition in which the blood contains too much iron.  Polycythemia vera. This is a condition in which the blood contains too many red blood cells.  Porphyria cutanea tarda. This is a disease in which an important part of hemoglobin is not made properly. It results in the buildup of abnormal amounts of porphyrins in the body.  Sickle cell disease. This is a condition in which the red blood cells form an abnormal crescent shape rather than a round shape. Tell a health care provider about:  Any allergies you have.  All medicines you are taking, including vitamins, herbs, eye drops, creams, and over-the-counter medicines.  Any problems you or family members have had with anesthetic medicines.  Any blood disorders you have.  Any surgeries you have had.  Any medical conditions you have.  Whether you are pregnant or may be pregnant. What are the risks? Generally, this is a safe procedure. However, problems may occur, including:  Nausea or light-headedness.  Low blood pressure (hypotension).  Soreness, bleeding, swelling, or bruising at the needle insertion site.  Infection. What happens before the procedure?  Follow instructions from your health care provider about eating or drinking restrictions.  Ask your health care provider about: ? Changing or stopping your regular medicines. This is especially important if you are taking diabetes medicines or blood thinners (anticoagulants). ? Taking medicines such as aspirin and ibuprofen. These medicines can thin your  blood. Do not take these medicines unless your health care provider tells you to take them. ? Taking over-the-counter medicines, vitamins, herbs, and supplements.  Wear clothing with sleeves that can be raised above the elbow.  Plan to have someone take you home from the hospital or clinic.  You may have a blood sample taken.  Your blood pressure, pulse rate, and breathing rate will be measured. What happens during the procedure?  To lower your risk of infection: ? Your health care team will wash or sanitize their hands. ? Your skin will be cleaned with an antiseptic.  You may be given a medicine to numb the area (local anesthetic).  A tourniquet will be placed on your arm.  A needle will be inserted into one of your veins.  Tubing and a collection bag will be attached to that needle.  Blood will flow through the needle and tubing into the collection bag.  The collection bag will be placed lower than your arm to allow gravity to help the flow of blood into the bag.  You may be asked to open and close your hand slowly and continually during the entire collection.  After the specified amount of blood has been removed from your body, the collection bag and tubing will be clamped.  The needle will be removed from your vein.  Pressure will be held on the site of the needle insertion to stop the bleeding.  A bandage (dressing) will be placed over the needle insertion site. The procedure may vary among health care providers and hospitals.   What happens after the procedure?  Your blood pressure, pulse rate, and breathing rate will   be measured after the procedure.  You will be encouraged to drink fluids.  Your recovery will be assessed and monitored.  You can return to your normal activities as told by your health care provider. Summary  Therapeutic phlebotomy is the planned removal of blood from a person's body for the purpose of treating a medical condition.  Therapeutic  phlebotomy may be used to treat hemochromatosis, polycythemia vera, porphyria cutanea tarda, or sickle cell disease.  In the procedure, a needle is inserted and about a pint (470 mL, or 0.47 L) of blood is removed. The average adult has 9-12 pints (4.3-5.7 L) of blood in the body.  This is generally a safe procedure, but it can sometimes cause problems such as nausea, light-headedness, or low blood pressure (hypotension). This information is not intended to replace advice given to you by your health care provider. Make sure you discuss any questions you have with your health care provider. Document Revised: 02/22/2017 Document Reviewed: 02/22/2017 Elsevier Patient Education  2021 Elsevier Inc.  

## 2020-07-29 NOTE — Progress Notes (Signed)
No recent CBC results available for review. Per Dr. Marin Olp, recent hematocrit from patient's PCP was 45.2%. Dr. Marin Olp advised to proceed with phlebotomy today.

## 2020-07-30 ENCOUNTER — Telehealth: Payer: Self-pay

## 2020-07-30 NOTE — Telephone Encounter (Signed)
Called patient as a follow-up from yesterday's phlebotomy. Patient states she feels fatigued today, but feels well otherwise. Also, c/o sternal discomfort. Explained to patient we performed a light sternal rub yesterday when she was unresponsive. Patient also states she is going to notify her cardiologist of yesterday's event. Encouraged patient to call Dr. Antonieta Pert office or go to nearest emergency department if needed. Patient verbalized understanding.

## 2020-07-30 NOTE — Progress Notes (Signed)
Approximately 10 mins after completion of phlebotomy procedure, patient began c/o diaphoresis and dizziness. Patient rapidly deteriorated and became syncopal/unresponsive to painful stimulus lasting approximately 2 mins before returning to awake/alert/oriented. IV site established and IV NS bolus initiated. Dr. Marin Olp notified and advised ok to proceed with IV fluid bolus. After bolus initiated, care turned over to Leanne Chang, RN.

## 2020-09-30 ENCOUNTER — Telehealth: Payer: Self-pay | Admitting: Interventional Cardiology

## 2020-09-30 NOTE — Telephone Encounter (Signed)
Spoke with Sunday Spillers from [facility] and she was inquiring about dx of CHF.  Reviewed chart and advised I do see where Melina Copa, PA-C mentioned "she has not had issues with CHF so nothing acutely to do".  CHF not on problem list.  Spoke with [name] and he said pt may have diastolic HF.  Sunday Spillers appreciative for call.

## 2020-09-30 NOTE — Telephone Encounter (Signed)
[name] is calling with questions about the pt primary diagnosis and medications

## 2020-10-12 ENCOUNTER — Inpatient Hospital Stay: Payer: HMO | Admitting: Family

## 2020-10-12 ENCOUNTER — Encounter: Payer: Self-pay | Admitting: Family

## 2020-10-12 ENCOUNTER — Other Ambulatory Visit: Payer: Self-pay

## 2020-10-12 ENCOUNTER — Inpatient Hospital Stay: Payer: HMO | Attending: Hematology & Oncology

## 2020-10-12 ENCOUNTER — Telehealth: Payer: Self-pay | Admitting: *Deleted

## 2020-10-12 VITALS — BP 169/70 | HR 57 | Temp 98.3°F | Resp 18 | Ht 60.0 in | Wt 115.0 lb

## 2020-10-12 DIAGNOSIS — D751 Secondary polycythemia: Secondary | ICD-10-CM

## 2020-10-12 DIAGNOSIS — Z87891 Personal history of nicotine dependence: Secondary | ICD-10-CM | POA: Diagnosis not present

## 2020-10-12 DIAGNOSIS — Z79899 Other long term (current) drug therapy: Secondary | ICD-10-CM | POA: Diagnosis not present

## 2020-10-12 DIAGNOSIS — D500 Iron deficiency anemia secondary to blood loss (chronic): Secondary | ICD-10-CM | POA: Diagnosis not present

## 2020-10-12 DIAGNOSIS — D45 Polycythemia vera: Secondary | ICD-10-CM | POA: Diagnosis not present

## 2020-10-12 DIAGNOSIS — Z8619 Personal history of other infectious and parasitic diseases: Secondary | ICD-10-CM | POA: Insufficient documentation

## 2020-10-12 LAB — CBC WITH DIFFERENTIAL (CANCER CENTER ONLY)
Abs Immature Granulocytes: 0.01 10*3/uL (ref 0.00–0.07)
Basophils Absolute: 0 10*3/uL (ref 0.0–0.1)
Basophils Relative: 0 %
Eosinophils Absolute: 0 10*3/uL (ref 0.0–0.5)
Eosinophils Relative: 1 %
HCT: 43.2 % (ref 36.0–46.0)
Hemoglobin: 14.6 g/dL (ref 12.0–15.0)
Immature Granulocytes: 0 %
Lymphocytes Relative: 25 %
Lymphs Abs: 1.5 10*3/uL (ref 0.7–4.0)
MCH: 32.4 pg (ref 26.0–34.0)
MCHC: 33.8 g/dL (ref 30.0–36.0)
MCV: 95.8 fL (ref 80.0–100.0)
Monocytes Absolute: 0.6 10*3/uL (ref 0.1–1.0)
Monocytes Relative: 10 %
Neutro Abs: 3.9 10*3/uL (ref 1.7–7.7)
Neutrophils Relative %: 64 %
Platelet Count: 263 10*3/uL (ref 150–400)
RBC: 4.51 MIL/uL (ref 3.87–5.11)
RDW: 11.1 % — ABNORMAL LOW (ref 11.5–15.5)
WBC Count: 6 10*3/uL (ref 4.0–10.5)
nRBC: 0 % (ref 0.0–0.2)

## 2020-10-12 LAB — CMP (CANCER CENTER ONLY)
ALT: 14 U/L (ref 0–44)
AST: 22 U/L (ref 15–41)
Albumin: 4.4 g/dL (ref 3.5–5.0)
Alkaline Phosphatase: 71 U/L (ref 38–126)
Anion gap: 9 (ref 5–15)
BUN: 14 mg/dL (ref 8–23)
CO2: 31 mmol/L (ref 22–32)
Calcium: 10 mg/dL (ref 8.9–10.3)
Chloride: 96 mmol/L — ABNORMAL LOW (ref 98–111)
Creatinine: 0.94 mg/dL (ref 0.44–1.00)
GFR, Estimated: >60 mL/min (ref >60)
Glucose, Bld: 116 mg/dL — ABNORMAL HIGH (ref 70–99)
Potassium: 4.7 mmol/L (ref 3.5–5.1)
Sodium: 136 mmol/L (ref 135–145)
Total Bilirubin: 0.7 mg/dL (ref 0.3–1.2)
Total Protein: 7.4 g/dL (ref 6.5–8.1)

## 2020-10-12 NOTE — Telephone Encounter (Signed)
Per 10/12/20 los - gave upcoming appointments - confirmed

## 2020-10-12 NOTE — Progress Notes (Signed)
Hematology and Oncology Follow Up Visit  [name] [id number] October 04, 1950 70 y.o. 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 She is doing well but has some mild fatigue at times. She takes a break to rest when needed.  She quit smoking cigarettes in June and is now just occasionally vaping.  Her last phlebotomy was in June. Hct today is 43%.  No fever, chills, n/v, cough, rash, dizziness, SOB, chest pain, palpitations, abdominal pain or changes in bowel or bladder habits.  No swelling, tenderness, numbness or tingling in her extremities.  No falls or syncope.  She has maintained a good appetite and is working on staying well hydrated throughout the day. Her weight is stable at 115 lbs.  She got a new sweet Aussie puppy named Birdie Sons and is really enjoying her.   ECOG Performance Status: 1 - Symptomatic but completely ambulatory  Medications:  Allergies as of 10/12/2020       Reactions   Diphenhydramine Hives   Sulfa Antibiotics Nausea And Vomiting   Bactrim [sulfamethoxazole-trimethoprim] Nausea And Vomiting        Medication List        Accurate as of October 12, 2020  2:20 PM. If you have any questions, ask your nurse or doctor.          acetaminophen 325 MG tablet Commonly known as: TYLENOL Take 325-650 mg by mouth every 6 (six) hours as needed for mild pain or headache.   ALPRAZolam 0.5 MG tablet Commonly known as: XANAX Take 0.5 mg by mouth at bedtime.   Biotin 1000 MCG tablet Take 1,000 mcg by mouth daily.   Breo Ellipta 100-25 MCG/INH Aepb Generic drug: fluticasone furoate-vilanterol INHALE ONE PUFF ONCE A DAY   clopidogrel 75 MG tablet Commonly  known as: PLAVIX Take 1 tablet by mouth once daily   clotrimazole-betamethasone cream Commonly known as: LOTRISONE Apply 1 application topically 2 (two) times daily as needed (to affected area).   escitalopram 10 MG tablet Commonly known as: LEXAPRO Take 5 mg by mouth daily. Patient is taking 5 MG daily   hydrochlorothiazide 25 MG tablet Commonly known as: HYDRODIURIL Take 1 tablet by mouth once daily   lansoprazole 30 MG capsule Commonly known as: PREVACID Take 30 mg by mouth daily as needed (for ulcer/acid reflex). Ulcer/ acid reflux   metoprolol succinate 100 MG 24 hr tablet Commonly known as: TOPROL-XL TAKE 1 & 1/2 (ONE & ONE-HALF) TABLETS BY MOUTH ONCE DAILY . Please make yearly appt with [name] for May for future refills. 1st attempt   nitroGLYCERIN 0.4 MG SL tablet Commonly known as: NITROSTAT   rosuvastatin 10 MG tablet Commonly known as: CRESTOR Take 1 tablet (10 mg total) by mouth every other day.   telmisartan 20 MG tablet Commonly known as: MICARDIS Take 1 tablet by mouth once daily   vitamin C 500 MG tablet Commonly known as: ASCORBIC ACID Take 500-1,000 mg by mouth daily.   VITAMIN C PO Take 2 tablets by mouth daily.   vitamin E 180 MG (400 UNITS) capsule Take 400 Units by mouth daily.  Allergies:  Allergies  Allergen Reactions   Diphenhydramine Hives   Sulfa Antibiotics Nausea And Vomiting   Bactrim [Sulfamethoxazole-Trimethoprim] Nausea And Vomiting    Past Medical History, Surgical history, Social history, and Family History were reviewed and updated.  Review of Systems: All other 10 point review of systems is negative.   Physical Exam:  height is 5' (1.524 m) and weight is 115 lb (52.2 kg). Her oral temperature is 98.3 F (36.8 C). Her blood pressure is 169/70 (abnormal) and her pulse is 57 (abnormal). Her respiration is 18 and oxygen saturation is 99%.   Wt Readings from Last 3 Encounters:  10/12/20 115 lb (52.2 kg)   05/05/20 116 lb (52.6 kg)  04/26/20 116 lb 12.8 oz (53 kg)    Ocular: Sclerae unicteric, pupils equal, round and reactive to light Ear-nose-throat: Oropharynx clear, dentition fair Lymphatic: No cervical or supraclavicular adenopathy Lungs no rales or rhonchi, good excursion bilaterally Heart regular rate and rhythm, no murmur appreciated Abd soft, nontender, positive bowel sounds MSK no focal spinal tenderness, no joint edema Neuro: non-focal, well-oriented, appropriate affect Breasts: Deferred   Lab Results  Component Value Date   WBC 6.0 10/12/2020   HGB 14.6 10/12/2020   HCT 43.2 10/12/2020   MCV 95.8 10/12/2020   PLT 263 10/12/2020   Lab Results  Component Value Date   FERRITIN 36 04/08/2020   IRON 99 04/08/2020   TIBC 390 04/08/2020   UIBC 291 04/08/2020   IRONPCTSAT 25 04/08/2020   Lab Results  Component Value Date   RETICCTPCT 1.2 08/03/2010   RBC 4.51 10/12/2020   RETICCTABS 61.3 08/03/2010   No results found for: KPAFRELGTCHN, LAMBDASER, KAPLAMBRATIO No results found for: Kandis Cocking, IGMSERUM No results found for: Odetta Pink, SPEI   Chemistry      Component Value Date/Time   NA 136 10/12/2020 1309   NA 137 04/01/2019 1649   NA 139 01/03/2017 1327   NA 136 12/28/2015 1311   K 4.7 10/12/2020 1309   K 3.7 01/03/2017 1327   K 4.9 12/28/2015 1311   CL 96 (L) 10/12/2020 1309   CL 94 (L) 01/03/2017 1327   CO2 31 10/12/2020 1309   CO2 31 01/03/2017 1327   CO2 28 12/28/2015 1311   BUN 14 10/12/2020 1309   BUN 14 04/01/2019 1649   BUN 12 01/03/2017 1327   BUN 10.4 12/28/2015 1311   CREATININE 0.94 10/12/2020 1309   CREATININE 1.0 01/03/2017 1327   CREATININE 0.9 12/28/2015 1311      Component Value Date/Time   CALCIUM 10.0 10/12/2020 1309   CALCIUM 8.8 01/03/2017 1327   CALCIUM 9.6 12/28/2015 1311   ALKPHOS 71 10/12/2020 1309   ALKPHOS 85 (H) 01/03/2017 1327   ALKPHOS 83 12/28/2015 1311    AST 22 10/12/2020 1309   AST 25 12/28/2015 1311   ALT 14 10/12/2020 1309   ALT 19 01/03/2017 1327   ALT 13 12/28/2015 1311   BILITOT 0.7 10/12/2020 1309   BILITOT 0.74 12/28/2015 1311       Impression and Plan: [name] is a 70 yo caucasian female with polycythemia.  Hct is 43% today, no phlebotomy needed.  Follow-up in 3 months.  She can contact our office with any questions or concerns.   Laverna Peace, NP 8/23/20222:20 PM

## 2020-10-13 LAB — IRON AND TIBC
Iron: 146 ug/dL — ABNORMAL HIGH (ref 41–142)
Saturation Ratios: 35 % (ref 21–57)
TIBC: 421 ug/dL (ref 236–444)
UIBC: 275 ug/dL (ref 120–384)

## 2020-10-13 LAB — FERRITIN: Ferritin: 48 ng/mL (ref 11–307)

## 2020-10-28 NOTE — Progress Notes (Signed)
Cardiology Office Note:    Date:  10/29/2020   ID:  [name], DOB [birth year], MRN [id number]  PCP:  Wenda Low, MD  Cardiologist:  Sinclair Grooms, MD   Referring MD: Wenda Low, MD   Chief Complaint  Patient presents with   Coronary Artery Disease   Hyperlipidemia     History of Present Illness:    [name] is a 70 y.o. female with a hx of  hypertension, CAD s/p stent 2010, left carotid endarterectomy 07/2016, right carotid endarterectomy 2007, polycythemia vera, tobacco abuse, COPD, GERD and anxiety.  Polycythemia vera and hepatitis C followed by Dr. Marin Olp, hematology with intermittent phlebotomy, and recent concern about CHF.   She is doing okay.  She denies angina.  Denies orthopnea, PND, and edema.  There was a question of whether she had heart failure.  Her LV function is normal.  She does not have ischemia on nuclear testing.  Last LVEF was greater than 60% but she did have grade 2 diastolic dysfunction.  She stopped smoking in June.  Hopefully she will continue.  Past Medical History:  Diagnosis Date   Anxiety    Arthritis    CAD (coronary artery disease)    a. s/p LCX stent 2010 with residual disease.   Carotid artery disease (HCC)    COPD (chronic obstructive pulmonary disease) (HCC)    Depression    GERD (gastroesophageal reflux disease)    Hepatitis C    Hypertension    Noncompliance with medication regimen    Polycythemia    PVD (peripheral vascular disease) ([place])    Seizures ([place]) 1970   x1- pt. reports that there was never any causative agent      Past Surgical History:  Procedure Laterality Date   2 stints  Aug 26,2010   Dr. Tawnya Crook   ABDOMINAL SURGERY  [phone]   for excessive bleeding from loss of pregnancy, required blood transfusion post procedure    CAROTID ENDARTERECTOMY  2007   cartoid endartherectomy  2007   CORONARY ANGIOPLASTY WITH STENT PLACEMENT  2010   CORONARY ANGIOPLASTY WITH STENT PLACEMENT   10/09/2008   ENDARTERECTOMY Left 07/24/2016   Procedure: ENDARTERECTOMY CAROTID;  Surgeon: [name], MD;  Location: [facility];  Service: Vascular;  Laterality: Left;   FLEXIBLE SIGMOIDOSCOPY N/A 03/22/2017   Procedure: Beryle Quant;  Surgeon: Carol Ada, MD;  Location: [facility];  Service: Endoscopy;  Laterality: N/A;   LYMPH NODE DISSECTION Left 1990's   benign    ORIF ELBOW FRACTURE Right 04/26/2018   Procedure: OPEN REDUCTION INTERNAL FIXATION (ORIF) ELBOW/OLECRANON FRACTURE;  Surgeon: Shona Needles, MD;  Location: [facility];  Service: Orthopedics;  Laterality: Right;   TUBAL LIGATION      Current Medications: Current Meds  Medication Sig   acetaminophen (TYLENOL) 325 MG tablet Take 325-650 mg by mouth every 6 (six) hours as needed for mild pain or headache.   ALPRAZolam (XANAX) 0.5 MG tablet Take 0.5 mg by mouth at bedtime.    Ascorbic Acid (VITAMIN C PO) Take 2 tablets by mouth daily.   Biotin 1000 MCG tablet Take 1,000 mcg by mouth daily.   BREO ELLIPTA 100-25 MCG/INH AEPB INHALE ONE PUFF ONCE A DAY   clopidogrel (PLAVIX) 75 MG tablet Take 1 tablet by mouth once daily   clotrimazole-betamethasone (LOTRISONE) cream Apply 1 application topically 2 (two) times daily as needed (to affected area).   escitalopram (LEXAPRO) 10 MG tablet Take 5 mg by mouth  daily. Patient is taking 5 MG daily   hydrochlorothiazide (HYDRODIURIL) 25 MG tablet Take 1 tablet by mouth once daily   lansoprazole (PREVACID) 30 MG capsule Take 30 mg by mouth daily as needed (for ulcer/acid reflex). Ulcer/ acid reflux   metoprolol succinate (TOPROL-XL) 100 MG 24 hr tablet TAKE 1 & 1/2 (ONE & ONE-HALF) TABLETS BY MOUTH ONCE DAILY . Please make yearly appt with [name] for May for future refills. 1st attempt   nitroGLYCERIN (NITROSTAT) 0.4 MG SL tablet    rosuvastatin (CRESTOR) 10 MG tablet Take 1 tablet (10 mg total) by mouth every other day.   telmisartan (MICARDIS) 20 MG tablet Take 1 tablet by mouth once  daily   vitamin C (ASCORBIC ACID) 500 MG tablet Take 500-1,000 mg by mouth daily.   vitamin E 180 MG (400 UNITS) capsule Take 400 Units by mouth daily.     Allergies:   Diphenhydramine, Sulfa antibiotics, and Bactrim [sulfamethoxazole-trimethoprim]   Social History   Socioeconomic History   Marital status: Single    Spouse name: Not on file   Number of children: Not on file   Years of education: Not on file   Highest education level: Not on file  Occupational History   Not on file  Tobacco Use   Smoking status: Former    Packs/day: 1.00    Years: 48.00    Pack years: 48.00    Types: Cigarettes    Start date: 03/20/1968    Quit date: 08/08/2019    Years since quitting: 1.2   Smokeless tobacco: Never  Vaping Use   Vaping Use: Every day   Start date: 08/07/2020  Substance and Sexual Activity   Alcohol use: Yes    Alcohol/week: 0.0 standard drinks    Comment: socially occasionally   Drug use: No    Comment: has smoked cigarettes off and on   Sexual activity: Not Currently  Other Topics Concern   Not on file  Social History Narrative   ** Merged History Encounter **       Tobacco use cigarettes: Current smoker Frequency 1 PPD Estimated Pack - years :30 Smoking : yes  No Tobacco exposure No alcohol No exercise Occupation : employed, works at home for [employer], Health visitor   n Boys 1 Girls 1   Social Determinants of Radio broadcast assistant Strain: Not on file  Food Insecurity: Not on file  Transportation Needs: Not on file  Physical Activity: Not on file  Stress: Not on file  Social Connections: Not on file     Family History: The patient's family history includes Heart disease in her father and mother.  ROS:   Please see the history of present illness.    Mother died 1 year ago.  She has more time for herself.  She discontinued smoking.  All other systems reviewed and are negative.  EKGs/Labs/Other Studies  Reviewed:    The following studies were reviewed today:\  2D Doppler echocardiogram performed in February 2021:  IMPRESSIONS     1. Left ventricular ejection fraction, by estimation, is 60 to 65%. The  left ventricle has normal function. The left ventricle has no regional  wall motion abnormalities. Left ventricular diastolic parameters are  consistent with Grade II diastolic  dysfunction (pseudonormalization).   2. Right ventricular systolic function is normal. The right ventricular  size is normal.   3. The mitral valve is normal in structure and function. Trivial mitral  valve  regurgitation. No evidence of mitral stenosis.   4. The aortic valve is normal in structure and function. Aortic valve  regurgitation is not visualized. No aortic stenosis is present.   5. The inferior vena cava is normal in size with greater than 50%  respiratory variability, suggesting right atrial pressure of 3 mmHg.    Myocardial perfusion imaging March 2022: The left ventricular ejection fraction is mildly decreased (45-54%). Nuclear stress EF: 47%. ST segment depression was noted during stress in the II, III and aVF leads, and returning to baseline after 1-5 minutes of recovery. No T wave inversion was noted during stress. Normal perfusion with no evidence of ischemia or infarction. The LVEF on the current study is mildly reduced, however, gated imaging limited on current study. Would correlate with TTE which appeared to show normal LVEF 60-65% in 2021. The study is normal. This is a low risk study.   Gwyndolyn Kaufman, MD     EKG:  EKG not done  Recent Labs: 10/12/2020: ALT 14; BUN 14; Creatinine 0.94; Hemoglobin 14.6; Platelet Count 263; Potassium 4.7; Sodium 136  Recent Lipid Panel    Component Value Date/Time   CHOL 142 12/04/2017 1203   TRIG 95 12/04/2017 1203   HDL 62 12/04/2017 1203   CHOLHDL 2.3 12/04/2017 1203   CHOLHDL 5.1 (H) 09/16/2014 1322   VLDL 37 (H) 09/16/2014 1322    LDLCALC 61 12/04/2017 1203    Physical Exam:    VS:  BP (!) 148/70   Pulse 61   Ht 5' (1.524 m)   Wt 118 lb 12.8 oz (53.9 kg)   SpO2 97%   BMI 23.20 kg/m     Wt Readings from Last 3 Encounters:  10/29/20 118 lb 12.8 oz (53.9 kg)  10/12/20 115 lb (52.2 kg)  05/05/20 116 lb (52.6 kg)     GEN: Slender.. No acute distress HEENT: Normal NECK: No JVD. LYMPHATICS: No lymphadenopathy CARDIAC: No murmur. RRR no gallop, or edema. VASCULAR:  Normal Pulses. No bruits. RESPIRATORY:  Clear to auscultation without rales, wheezing or rhonchi  ABDOMEN: Soft, non-tender, non-distended, No pulsatile mass, MUSCULOSKELETAL: No deformity  SKIN: Ecchymoses on both arms NEUROLOGIC:  Alert and oriented x 3 PSYCHIATRIC:  Normal affect   ASSESSMENT:    1. Coronary artery disease involving native coronary artery of native heart with angina pectoris ([place])   2. Chronic obstructive pulmonary disease, unspecified COPD type ([place])   3. Bilateral carotid artery stenosis   4. Essential hypertension   5. Hyperlipidemia LDL goal <70   6. S/P carotid endarterectomy   7. Diastolic dysfunction    PLAN:    In order of problems listed above:  Secondary prevention discussed.  150 minutes of moderate activity encouraged.  Continue with lipid-lowering.  She has stopped smoking. Smoking cessation helps. Secondary prevention prescribed.  Needs to have Doppler studies done. Elevated systolic blood pressure today but she has not had her medicines yet.  This is a common theme.  She takes medications somewhat haphazardly.  She is to blame the fact that she cared for her mother on sporadic medication use. Continue rosuvastatin 10 mg/day.  Most recent LDL in May was 25. Follow-up with [facility].  Has not had carotid Doppler follow-up yet this year No evidence of volume overload or decompensated heart failure.  Overall education and awareness concerning primary/secondary risk prevention was discussed in detail: LDL less  than 70, hemoglobin A1c less than 7, blood pressure target less than 130/80 mmHg, >150 minutes of  moderate aerobic activity per week, avoidance of smoking, weight control (via diet and exercise), and continued surveillance/management of/for obstructive sleep apnea.    Medication Adjustments/Labs and Tests Ordered: Current medicines are reviewed at length with the patient today.  Concerns regarding medicines are outlined above.  No orders of the defined types were placed in this encounter.  No orders of the defined types were placed in this encounter.   Patient Instructions  Medication Instructions:  Your physician recommends that you continue on your current medications as directed. Please refer to the Current Medication list given to you today.  *If you need a refill on your cardiac medications before your next appointment, please call your pharmacy*   Lab Work: None ordered  If you have labs (blood work) drawn today and your tests are completely normal, you will receive your results only by: [place] (if you have MyChart) OR A paper copy in the mail If you have any lab test that is abnormal or we need to change your treatment, we will call you to review the results.   Testing/Procedures: None ordered   Follow-Up: At [facility], you and your health needs are our priority.  As part of our continuing mission to provide you with exceptional heart care, we have created designated Provider Care Teams.  These Care Teams include your primary Cardiologist (physician) and Advanced Practice Providers (APPs -  Physician Assistants and Nurse Practitioners) who all work together to provide you with the care you need, when you need it.  We recommend signing up for the patient portal called "MyChart".  Sign up information is provided on this After Visit Summary.  MyChart is used to connect with patients for Virtual Visits (Telemedicine).  Patients are able to view lab/test results,  encounter notes, upcoming appointments, etc.  Non-urgent messages can be sent to your provider as well.   To learn more about what you can do with MyChart, go to [email or web address].    Your next appointment:   12 month(s)  The format for your next appointment:   In Person  Provider:   You may see Sinclair Grooms, MD or one of the following Advanced Practice Providers on your designated Care Team:   Cecilie Kicks, NP    Other Instructions    Signed, Sinclair Grooms, MD  10/29/2020 1:59 PM    [place]

## 2020-10-29 ENCOUNTER — Ambulatory Visit (INDEPENDENT_AMBULATORY_CARE_PROVIDER_SITE_OTHER): Payer: HMO | Admitting: Interventional Cardiology

## 2020-10-29 ENCOUNTER — Encounter: Payer: Self-pay | Admitting: Interventional Cardiology

## 2020-10-29 ENCOUNTER — Other Ambulatory Visit: Payer: Self-pay

## 2020-10-29 VITALS — BP 148/70 | HR 61 | Ht 60.0 in | Wt 118.8 lb

## 2020-10-29 DIAGNOSIS — E785 Hyperlipidemia, unspecified: Secondary | ICD-10-CM

## 2020-10-29 DIAGNOSIS — Z9889 Other specified postprocedural states: Secondary | ICD-10-CM | POA: Diagnosis not present

## 2020-10-29 DIAGNOSIS — I25119 Atherosclerotic heart disease of native coronary artery with unspecified angina pectoris: Secondary | ICD-10-CM

## 2020-10-29 DIAGNOSIS — I6523 Occlusion and stenosis of bilateral carotid arteries: Secondary | ICD-10-CM

## 2020-10-29 DIAGNOSIS — I5189 Other ill-defined heart diseases: Secondary | ICD-10-CM | POA: Diagnosis not present

## 2020-10-29 DIAGNOSIS — I10 Essential (primary) hypertension: Secondary | ICD-10-CM | POA: Diagnosis not present

## 2020-10-29 DIAGNOSIS — J449 Chronic obstructive pulmonary disease, unspecified: Secondary | ICD-10-CM | POA: Diagnosis not present

## 2020-10-29 NOTE — Patient Instructions (Signed)
Medication Instructions:  Your physician recommends that you continue on your current medications as directed. Please refer to the Current Medication list given to you today.  *If you need a refill on your cardiac medications before your next appointment, please call your pharmacy*   Lab Work: None ordered  If you have labs (blood work) drawn today and your tests are completely normal, you will receive your results only by: [place] (if you have MyChart) OR A paper copy in the mail If you have any lab test that is abnormal or we need to change your treatment, we will call you to review the results.   Testing/Procedures: None ordered   Follow-Up: At [facility], you and your health needs are our priority.  As part of our continuing mission to provide you with exceptional heart care, we have created designated Provider Care Teams.  These Care Teams include your primary Cardiologist (physician) and Advanced Practice Providers (APPs -  Physician Assistants and Nurse Practitioners) who all work together to provide you with the care you need, when you need it.  We recommend signing up for the patient portal called "MyChart".  Sign up information is provided on this After Visit Summary.  MyChart is used to connect with patients for Virtual Visits (Telemedicine).  Patients are able to view lab/test results, encounter notes, upcoming appointments, etc.  Non-urgent messages can be sent to your provider as well.   To learn more about what you can do with MyChart, go to [email or web address].    Your next appointment:   12 month(s)  The format for your next appointment:   In Person  Provider:   You may see Sinclair Grooms, MD or one of the following Advanced Practice Providers on your designated Care Team:   Cecilie Kicks, NP    Other Instructions

## 2020-11-04 ENCOUNTER — Other Ambulatory Visit: Payer: Self-pay

## 2020-11-04 ENCOUNTER — Ambulatory Visit
Admission: EM | Admit: 2020-11-04 | Discharge: 2020-11-04 | Disposition: A | Discharge status: Home / Self Care | Payer: HMO

## 2020-11-04 ENCOUNTER — Encounter (HOSPITAL_COMMUNITY): Payer: Self-pay

## 2020-11-04 ENCOUNTER — Emergency Department (HOSPITAL_COMMUNITY): Payer: HMO

## 2020-11-04 ENCOUNTER — Emergency Department (HOSPITAL_COMMUNITY)
Admission: EM | Admit: 2020-11-04 | Discharge: 2020-11-04 | Disposition: A | Discharge status: Home / Self Care | Payer: HMO | Attending: Emergency Medicine | Admitting: Emergency Medicine

## 2020-11-04 DIAGNOSIS — S0033XA Contusion of nose, initial encounter: Secondary | ICD-10-CM | POA: Diagnosis not present

## 2020-11-04 DIAGNOSIS — S6992XA Unspecified injury of left wrist, hand and finger(s), initial encounter: Secondary | ICD-10-CM | POA: Diagnosis not present

## 2020-11-04 DIAGNOSIS — W19XXXA Unspecified fall, initial encounter: Secondary | ICD-10-CM | POA: Insufficient documentation

## 2020-11-04 DIAGNOSIS — R519 Headache, unspecified: Secondary | ICD-10-CM | POA: Insufficient documentation

## 2020-11-04 DIAGNOSIS — M79642 Pain in left hand: Secondary | ICD-10-CM | POA: Insufficient documentation

## 2020-11-04 DIAGNOSIS — S0990XA Unspecified injury of head, initial encounter: Secondary | ICD-10-CM | POA: Diagnosis not present

## 2020-11-04 DIAGNOSIS — S12300A Unspecified displaced fracture of fourth cervical vertebra, initial encounter for closed fracture: Secondary | ICD-10-CM | POA: Diagnosis not present

## 2020-11-04 DIAGNOSIS — M25532 Pain in left wrist: Secondary | ICD-10-CM | POA: Diagnosis not present

## 2020-11-04 DIAGNOSIS — S12390A Other displaced fracture of fourth cervical vertebra, initial encounter for closed fracture: Secondary | ICD-10-CM | POA: Diagnosis not present

## 2020-11-04 DIAGNOSIS — S62325A Displaced fracture of shaft of fourth metacarpal bone, left hand, initial encounter for closed fracture: Secondary | ICD-10-CM | POA: Diagnosis not present

## 2020-11-04 DIAGNOSIS — S129XXA Fracture of neck, unspecified, initial encounter: Secondary | ICD-10-CM

## 2020-11-04 DIAGNOSIS — Y9301 Activity, walking, marching and hiking: Secondary | ICD-10-CM | POA: Diagnosis not present

## 2020-11-04 DIAGNOSIS — R58 Hemorrhage, not elsewhere classified: Secondary | ICD-10-CM | POA: Diagnosis not present

## 2020-11-04 DIAGNOSIS — M542 Cervicalgia: Secondary | ICD-10-CM | POA: Diagnosis not present

## 2020-11-04 DIAGNOSIS — I509 Heart failure, unspecified: Secondary | ICD-10-CM | POA: Diagnosis not present

## 2020-11-04 DIAGNOSIS — Z043 Encounter for examination and observation following other accident: Secondary | ICD-10-CM | POA: Diagnosis not present

## 2020-11-04 DIAGNOSIS — I110 Hypertensive heart disease with heart failure: Secondary | ICD-10-CM | POA: Insufficient documentation

## 2020-11-04 DIAGNOSIS — R030 Elevated blood-pressure reading, without diagnosis of hypertension: Secondary | ICD-10-CM | POA: Diagnosis not present

## 2020-11-04 DIAGNOSIS — M25539 Pain in unspecified wrist: Secondary | ICD-10-CM | POA: Diagnosis not present

## 2020-11-04 HISTORY — DX: Polycythemia vera: D45

## 2020-11-04 HISTORY — DX: Heart failure, unspecified: I50.9

## 2020-11-04 HISTORY — DX: Other specified postprocedural states: Z98.890

## 2020-11-04 HISTORY — DX: Essential (primary) hypertension: I10

## 2020-11-04 MED ORDER — OXYCODONE HCL 5 MG PO TABS
5.0000 mg | ORAL_TABLET | Freq: Once | ORAL | Status: DC
Start: 2020-11-04 — End: 2020-11-04

## 2020-11-04 MED ORDER — ACETAMINOPHEN 500 MG PO TABS
1000.0000 mg | ORAL_TABLET | Freq: Once | ORAL | Status: AC
  Administered 2020-11-04: 1000 mg via ORAL
  Filled 2020-11-04: qty 2

## 2020-11-04 MED ORDER — MORPHINE SULFATE 15 MG PO TABS: 7.5000 mg | ORAL_TABLET | ORAL | 0 refills | Status: DC | PRN

## 2020-11-04 NOTE — Progress Notes (Signed)
Orthopedic Tech Progress Note Patient Details:  [name] [facility] 03/14/1950 [id number]  Ortho Devices Type of Ortho Device: Ulna gutter splint Ortho Device/Splint Location: LUE Ortho Device/Splint Interventions: Application, Ordered   Post Interventions Patient Tolerated: Well Instructions Provided: Care of device, Poper ambulation with device  [name] A Omeed Osuna 11/04/2020, 6:06 PM

## 2020-11-04 NOTE — Progress Notes (Signed)
Orthopedic Tech Progress Note Patient Details:  [name] [facility] 1950/11/13 [id number] Level 2 Trauma Patient ID: [name], female   DOB: 28-Aug-1950, 70 y.o.   MRN: [id number]  [name] 11/04/2020, 4:30 PM

## 2020-11-04 NOTE — ED Provider Notes (Signed)
[facility]    CSN: [id number] Arrival date & time: 11/04/20  1419      History   Chief Complaint Chief Complaint  Patient presents with   Fall    HPI [name] is a 70 y.o. female.   Patient presents for further evaluation after a fall that occurred yesterday.  Patient states that she was walking her dog outside when the dog ran and and she fell and landed on her face.  Patient also thinks that she landed on her left wrist because she is having pain in that area as well.  Patient does endorses headache and neck pain.  Patient takes Plavix daily.  Denies any pain in any other part of the body.  Denies any blurred vision, dizziness, nausea, vomiting.   Fall   Past Medical History:  Diagnosis Date   Anxiety    Arthritis    CAD (coronary artery disease)    a. s/p LCX stent 2010 with residual disease.   Carotid artery disease (HCC)    COPD (chronic obstructive pulmonary disease) (HCC)    Depression    GERD (gastroesophageal reflux disease)    Hepatitis C    Hypertension    Noncompliance with medication regimen    Polycythemia    PVD (peripheral vascular disease) (HCC)    Seizures ([place]) 1970   x1- pt. reports that there was never any causative agent      Patient Active Problem List   Diagnosis Date Noted   History of syncope 04/26/2020   Pre-op evaluation 04/26/2020   Monteggia's fracture of right ulna, init for clos fx 05/02/2018   Radius/ulna fracture, right, closed, initial encounter 04/26/2018   HTN (hypertension) 04/26/2018   CAD S/P percutaneous coronary angioplasty 04/26/2018   Polycythemia vera ([place]) 04/26/2018   COPD (chronic obstructive pulmonary disease) ([place]) 04/26/2018   Hyperlipidemia LDL goal <70 10/30/2017   Colitis 03/21/2017   Colitis with rectal bleeding 03/21/2017   Polycythemia vera ([place]) 03/21/2017   Carotid artery stenosis 07/24/2016   Chest pain 03/09/2015   Tobacco abuse 03/09/2015   Influenza 03/02/2012    COPD exacerbation ([place]) 03/02/2012   Hypoxia 03/02/2012   Hyponatremia 03/02/2012   Hematochezia 10/30/2011   Coronary artery disease involving native coronary artery of native heart with angina pectoris ([place])    GERD (gastroesophageal reflux disease)    Polycythemia    Irritable bowel syndrome 08/15/2010   HERPES ZOSTER 10/07/2009   LIPOMA 10/07/2009   HERPETIC GINGIVOSTOMATITIS 01/06/2009   UNSPECIFIED VITAMIN D DEFICIENCY 01/06/2009   ESOPHAGEAL REFLUX 06/04/2008   ANXIETY DEPRESSION 01/24/2007   OSTEOARTHRITIS, GENERALIZED, MULTIPLE JOINTS 11/21/2006   Acute hepatitis C virus infection 10/23/2006   Essential hypertension 10/23/2006    Past Surgical History:  Procedure Laterality Date   2 stints  Aug 26,2010   Dr. Tawnya Crook   ABDOMINAL SURGERY  [phone]   for excessive bleeding from loss of pregnancy, required blood transfusion post procedure    CAROTID ENDARTERECTOMY  2007   cartoid endartherectomy  2007   CORONARY ANGIOPLASTY WITH STENT PLACEMENT  2010   CORONARY ANGIOPLASTY WITH STENT PLACEMENT  10/09/2008   ENDARTERECTOMY Left 07/24/2016   Procedure: ENDARTERECTOMY CAROTID;  Surgeon: [name], MD;  Location: [place];  Service: Vascular;  Laterality: Left;   FLEXIBLE SIGMOIDOSCOPY N/A 03/22/2017   Procedure: Beryle Quant;  Surgeon: Carol Ada, MD;  Location: [place] [facility];  Service: Endoscopy;  Laterality: N/A;   LYMPH NODE DISSECTION Left 1990's   benign  ORIF ELBOW FRACTURE Right 04/26/2018   Procedure: OPEN REDUCTION INTERNAL FIXATION (ORIF) ELBOW/OLECRANON FRACTURE;  Surgeon: Shona Needles, MD;  Location: [facility];  Service: Orthopedics;  Laterality: Right;   TUBAL LIGATION      OB History   No obstetric history on file.      Home Medications    Prior to Admission medications   Medication Sig Start Date End Date Taking? Authorizing Provider  acetaminophen (TYLENOL) 325 MG tablet Take 325-650 mg by mouth every 6 (six) hours as needed for mild pain or  headache.    [provider]  ALPRAZolam Duanne Moron) 0.5 MG tablet Take 0.5 mg by mouth at bedtime.  11/27/12   [provider]  Ascorbic Acid (VITAMIN C PO) Take 2 tablets by mouth daily.    [provider]  Biotin 1000 MCG tablet Take 1,000 mcg by mouth daily.    [provider]  BREO ELLIPTA 100-25 MCG/INH AEPB INHALE ONE PUFF ONCE A DAY 03/22/18   [provider]  clopidogrel (PLAVIX) 75 MG tablet Take 1 tablet by mouth once daily 07/13/20   Belva Crome, MD  clotrimazole-betamethasone (LOTRISONE) cream Apply 1 application topically 2 (two) times daily as needed (to affected area).    [provider]  escitalopram (LEXAPRO) 10 MG tablet Take 5 mg by mouth daily. Patient is taking 5 MG daily    [provider]  hydrochlorothiazide (HYDRODIURIL) 25 MG tablet Take 1 tablet by mouth once daily 05/31/20   Belva Crome, MD  lansoprazole (PREVACID) 30 MG capsule Take 30 mg by mouth daily as needed (for ulcer/acid reflex). Ulcer/ acid reflux    [provider]  metoprolol succinate (TOPROL-XL) 100 MG 24 hr tablet TAKE 1 & 1/2 (ONE & ONE-HALF) TABLETS BY MOUTH ONCE DAILY . Please make yearly appt with [name] for May for future refills. 1st attempt 03/14/19   Belva Crome, MD  nitroGLYCERIN (NITROSTAT) 0.4 MG SL tablet     [provider]  rosuvastatin (CRESTOR) 10 MG tablet Take 1 tablet (10 mg total) by mouth every other day. 04/01/19   Dunn, Nedra Hai, PA-C  telmisartan (MICARDIS) 20 MG tablet Take 1 tablet by mouth once daily 07/21/20   Belva Crome, MD  vitamin C (ASCORBIC ACID) 500 MG tablet Take 500-1,000 mg by mouth daily.    [provider]  vitamin E 180 MG (400 UNITS) capsule Take 400 Units by mouth daily.    [provider]    Family History Family History  Problem Relation Age of Onset   Heart disease Mother    Heart disease Father     Social History Social History   Tobacco Use    Smoking status: Former    Packs/day: 1.00    Years: 48.00    Pack years: 48.00    Types: Cigarettes    Start date: 03/20/1968    Quit date: 08/08/2019    Years since quitting: 1.2   Smokeless tobacco: Never  Vaping Use   Vaping Use: Every day   Start date: 08/07/2020  Substance Use Topics   Alcohol use: Yes    Alcohol/week: 0.0 standard drinks    Comment: socially occasionally   Drug use: No    Comment: has smoked cigarettes off and on     Allergies   Diphenhydramine, Sulfa antibiotics, and Bactrim [sulfamethoxazole-trimethoprim]   Review of Systems Review of Systems Per HPI  Physical Exam Triage Vital Signs ED Triage Vitals  Enc Vitals Group     BP 11/04/20 1438 (!) 203/88     Pulse Rate 11/04/20 1438 63     Resp 11/04/20 1438 18     Temp 11/04/20 1438 97.9 F (36.6 C)     Temp Source 11/04/20 1438 Oral     SpO2 11/04/20 1438 97 %     Weight --      Height --      Head Circumference --      Peak Flow --      Pain Score 11/04/20 1440 6     Pain Loc --      Pain Edu? --      Excl. in [place]? --    No data found.  Updated Vital Signs BP (!) 204/149 (BP Location: Left Arm)   Pulse 63   Temp 97.9 F (36.6 C) (Oral)   Resp 18   SpO2 97%   Visual Acuity Right Eye Distance:   Left Eye Distance:   Bilateral Distance:    Right Eye Near:   Left Eye Near:    Bilateral Near:     Physical Exam Constitutional:      General: She is not in acute distress.    Appearance: Normal appearance. She is not ill-appearing or diaphoretic.  HENT:     Head: Normocephalic and atraumatic.     Right Ear: Tympanic membrane and ear canal normal.     Left Ear: Tympanic membrane and ear canal normal.     Nose: Nasal deformity present.     Comments: Patient has slight deviation of nose to the right.  bruising noted to bridge of nose.    Mouth/Throat:     Lips: Pink.  Eyes:     Extraocular Movements: Extraocular movements intact.     Conjunctiva/sclera:     Left eye: Hemorrhage  present.     Pupils: Pupils are equal, round, and reactive to light.     Comments: Subconjunctival hemorrhage present left of iris on left eye at left eye.  Cardiovascular:     Rate and Rhythm: Normal rate and regular rhythm.     Pulses: Normal pulses.     Heart sounds: Normal heart sounds.  Pulmonary:     Effort: Pulmonary effort is normal. No respiratory distress.     Breath sounds: Normal breath sounds. No wheezing or rhonchi.  Musculoskeletal:     Right wrist: Normal.     Left wrist: Swelling, tenderness and bony tenderness present. No deformity or crepitus. Normal pulse.     Cervical back: Tenderness present. No bony tenderness.     Thoracic back: Normal.     Lumbar back: Normal.     Comments: Tenderness to palpation to dorsal surface of left hand as well as generalized in the left wrist.  Tenderness to palpation to left paraspinal muscles of cervical spine.  Skin:    General: Skin is warm and dry.  Neurological:     General: No focal deficit present.     Mental Status: She is alert and oriented to person, place, and time. Mental status is at baseline.  Psychiatric:        Mood and Affect: Mood normal.        Behavior: Behavior normal.        Thought Content: Thought content normal.        Judgment: Judgment normal.     UC Treatments / Results  Labs (all labs ordered are listed, but only abnormal results are  displayed) Labs Reviewed - No data to display  EKG   Radiology No results found.  Procedures Procedures (including critical care time)  Medications Ordered in UC Medications - No data to display  Initial Impression / Assessment and Plan / UC Course  I have reviewed the triage vital signs and the nursing notes.  Pertinent labs & imaging results that were available during my care of the patient were reviewed by me and considered in my medical decision making (see chart for details).     Patient has multiple risk factors to indicate further evaluation and  management at the hospital and include daily Plavix, headache, head injury, severely elevated blood pressure.  Advised patient that she needs to go to the hospital.  Patient was agreeable.  Patient left to go to the hospital via EMS. Final Clinical Impressions(s) / UC Diagnoses   Final diagnoses:  None   Discharge Instructions   None    ED Prescriptions   None    PDMP not reviewed this encounter.   Odis Luster, FNP 11/04/20 1601

## 2020-11-04 NOTE — ED Triage Notes (Signed)
Pt c/o fall at home approx 830p last evening. States hit "face first" on nose and landed on hand causing pain in left hand and posterior base of the skull. Observable bleeding in left eye. Denies LOC but says "it took me a while to get up I had to lay there for a while." States on plavix.

## 2020-11-04 NOTE — ED Notes (Signed)
Pt refused the Oxycodone at this time as she states she will be driving when she gets discharged from [facility].

## 2020-11-04 NOTE — ED Provider Notes (Signed)
[facility] EMERGENCY DEPARTMENT Provider Note   CSN: [id number] Arrival date & time: 11/04/20  1631     History Chief Complaint  Patient presents with   [name] is a 70 y.o. female.  70 yo F with a cc of a fall.  Just got a new dog and lost her balance and fell.  Hurt her left hand and neck.  Went to urgent care to have an xray of her neck and hand and was sent here by ambulance for eval. the patient denies any confusion denies vomiting.  She has had no issues with her vision.  She was told at urgent care that they thought maybe she had broken her nose and she had subconjunctival hemorrhage and was sent here.  The history is provided by the patient.  Fall This is a new problem. The current episode started 2 days ago. The problem occurs constantly. The problem has not changed since onset.Pertinent negatives include no chest pain, no headaches and no shortness of breath. Nothing aggravates the symptoms. Nothing relieves the symptoms. She has tried nothing for the symptoms. The treatment provided no relief.      Past Medical History:  Diagnosis Date   CHF (congestive heart failure) ([place])    History of carotid endarterectomy    Hypertension    Polycythemia vera ([place])     There are no problems to display for this patient.   History reviewed. No pertinent surgical history.   OB History   No obstetric history on file.     No family history on file.  Social History   Tobacco Use   Smoking status: Never    Passive exposure: Never   Smokeless tobacco: Never  Vaping Use   Vaping Use: Every day  Substance Use Topics   Alcohol use: Not Currently   Drug use: Not Currently    Home Medications Prior to Admission medications   Medication Sig Start Date End Date Taking? Authorizing Provider  morphine (MSIR) 15 MG tablet Take 0.5 tablets (7.5 mg total) by mouth every 4 (four) hours as needed for severe pain. 11/04/20  Yes Deno Etienne, DO    Allergies    Sulfa antibiotics  Review of Systems   Review of Systems  Constitutional:  Negative for chills and fever.  HENT:  Negative for congestion and rhinorrhea.   Eyes:  Negative for redness and visual disturbance.  Respiratory:  Negative for shortness of breath and wheezing.   Cardiovascular:  Negative for chest pain and palpitations.  Gastrointestinal:  Negative for nausea and vomiting.  Genitourinary:  Negative for dysuria and urgency.  Musculoskeletal:  Positive for arthralgias, myalgias and neck pain.  Skin:  Negative for pallor and wound.  Neurological:  Negative for dizziness and headaches.   Physical Exam Updated Vital Signs BP (!) 213/82   Pulse (!) 55   Temp 99 F (37.2 C) (Oral)   Resp 15   Ht '5\' 1"'$  (1.549 m)   Wt 53.5 kg   SpO2 99%   BMI 22.30 kg/m   Physical Exam Vitals and nursing note reviewed.  Constitutional:      General: She is not in acute distress.    Appearance: She is well-developed. She is not diaphoretic.  HENT:     Head: Normocephalic.     Comments: Bruising to the bridge of the nose.  Left subconjunctival hematoma.  Pupils equal round and reactive to the other.  Extraocular motion intact.  No periorbital rim tenderness. Eyes:     Pupils: Pupils are equal, round, and reactive to light.  Cardiovascular:     Rate and Rhythm: Normal rate and regular rhythm.     Heart sounds: No murmur heard.   No friction rub. No gallop.  Pulmonary:     Effort: Pulmonary effort is normal.     Breath sounds: No wheezing or rales.  Abdominal:     General: There is no distension.     Palpations: Abdomen is soft.     Tenderness: There is no abdominal tenderness.  Musculoskeletal:        General: Tenderness present.     Cervical back: Normal range of motion and neck supple.     Comments: Bruising to the left hand mild tenderness along the fourth and fifth metacarpal.  No appreciable tenderness about the shoulder wrist or elbow.  Palpated from  head to toe without any other noted bony tenderness.  Skin:    General: Skin is warm and dry.  Neurological:     Mental Status: She is alert and oriented to person, place, and time.  Psychiatric:        Behavior: Behavior normal.    ED Results / Procedures / Treatments   Labs (all labs ordered are listed, but only abnormal results are displayed) Labs Reviewed - No data to display  EKG None  Radiology CT HEAD WO CONTRAST (5MM)  Result Date: 11/04/2020 CLINICAL DATA:  [place], landed on face, anticoagulated EXAM: CT HEAD WITHOUT CONTRAST TECHNIQUE: Contiguous axial images were obtained from the base of the skull through the vertex without intravenous contrast. COMPARISON:  03/22/2008 FINDINGS: Brain: No acute infarct or hemorrhage. Lateral ventricles and midline structures are unremarkable. No acute extra-axial fluid collections. No mass effect. Vascular: No hyperdense vessel or unexpected calcification. Skull: Normal. Negative for fracture or focal lesion. Sinuses/Orbits: Minimal mucosal thickening right sphenoid sinus. Remaining sinuses are clear. Other: None. IMPRESSION: 1. No acute intracranial process. Electronically Signed   By: Randa Ngo M.D.   On: 11/04/2020 17:26   CT Cervical Spine Wo Contrast  Result Date: 11/04/2020 CLINICAL DATA:  [place] while walking dog, anticoagulated, landed on face EXAM: CT CERVICAL SPINE WITHOUT CONTRAST TECHNIQUE: Multidetector CT imaging of the cervical spine was performed without intravenous contrast. Multiplanar CT image reconstructions were also generated. COMPARISON:  None. FINDINGS: Alignment: Alignment is anatomic. Skull base and vertebrae: There is a comminuted C4 spinous process fracture which is minimally displaced. No other acute displaced cervical spine fractures. No destructive bony lesions. Soft tissues and spinal canal: No prevertebral fluid or swelling. No visible canal hematoma. Prominent atherosclerosis of the right carotid artery. Disc  levels: Moderate spondylosis at C6-7. Bony fusion across the C2-3 facet joints. Upper chest: Airway is patent.  Lung apices are clear. Other: Reconstructed images demonstrate no additional findings. IMPRESSION: 1. Comminuted and mildly displaced C4 spinous process fracture. 2. Moderate spondylosis at C6-7 and prominent facet hypertrophic changes at C2-3. Critical Value/emergent results were called by telephone at the time of interpretation on 11/04/2020 at 5:33 pm to provider Tisa Weisel , who verbally acknowledged these results. Electronically Signed   By: Randa Ngo M.D.   On: 11/04/2020 17:33   DG Hand Complete Left  Result Date: 11/04/2020 CLINICAL DATA:  [place], left hand pain EXAM: LEFT HAND - COMPLETE 3+ VIEW COMPARISON:  None. FINDINGS: Frontal, oblique, and lateral views of the left hand are obtained. There is an oblique intra-articular minimally displaced fracture at the  base of the fourth metacarpal. No other acute displaced fractures. Mild diffuse interphalangeal joint space narrowing. Soft tissues are unremarkable. IMPRESSION: 1. Comminuted oblique intra-articular fracture at the base of the fourth metacarpal. Electronically Signed   By: Randa Ngo M.D.   On: 11/04/2020 17:39    Procedures Procedures   Medications Ordered in ED Medications  oxyCODONE (Oxy IR/ROXICODONE) immediate release tablet 5 mg (has no administration in time range)  acetaminophen (TYLENOL) tablet 1,000 mg (1,000 mg Oral Given 11/04/20 1717)    ED Course  I have reviewed the triage vital signs and the nursing notes.  Pertinent labs & imaging results that were available during my care of the patient were reviewed by me and considered in my medical decision making (see chart for details).    MDM Rules/Calculators/A&P                           70 yo F with a chief complaints of a fall.  This happened last night when she was walking her dog.  Was having ongoing pain today and so went to urgent care.  She then  arrived here as a level 2 trauma.  We will obtain a CT scan of the head and C-spine.  Plain film of the left hand.  Treat pain.  Plain film the left hand with a spiral fracture of the shaft of the fourth metacarpal as viewed by me.  Very truly displaced.  We will place an ulnar gutter.  CT scan of the C-spine with a C4 spinous process fracture.  I discussed the case with Dr. Christella Noa, neurosurgery recommended follow-up in the office.  Did not feel that c-collar was necessary which she can wear for comfort or daily if it improves her symptoms.  Will discharge the patient home.  Given follow-up with Ortho neurosurgery and the patient is concerned because urgent care told her that her nose may be broken so we will give her follow-up with facial trauma.  6:30 PM:  I have discussed the diagnosis/risks/treatment options with the patient and believe the pt to be eligible for discharge home to follow-up with PCP, neurosurgery, hand, face. We also discussed returning to the ED immediately if new or worsening sx occur. We discussed the sx which are most concerning (e.g., sudden worsening pain, fever, inability to tolerate by mouth ) that necessitate immediate return. Medications administered to the patient during their visit and any new prescriptions provided to the patient are listed below.  Medications given during this visit Medications  oxyCODONE (Oxy IR/ROXICODONE) immediate release tablet 5 mg (has no administration in time range)  acetaminophen (TYLENOL) tablet 1,000 mg (1,000 mg Oral Given 11/04/20 1717)     The patient appears reasonably screen and/or stabilized for discharge and I doubt any other medical condition or other [facility] requiring further screening, evaluation, or treatment in the ED at this time prior to discharge.   Final Clinical Impression(s) / ED Diagnoses Final diagnoses:  Closed fracture of spinous process of cervical vertebra, initial encounter ([place])  Closed displaced fracture of  shaft of fourth metacarpal bone of left hand, initial encounter    Rx / DC Orders ED Discharge Orders          Ordered    morphine (MSIR) 15 MG tablet  Every 4 hours PRN        11/04/20 1824             Deno Etienne, DO 11/04/20  1830  

## 2020-11-04 NOTE — Discharge Instructions (Addendum)
You have broken a bone in your hand and a part of the bone in your neck.  Please follow-up with the neurosurgeon and hand doctor as scheduled.  If you are concerned about your nose being offkilter then please call the facial surgeon to have them see you in the office.  Typically they would see you in about a week to assess for realignment.  Your blood pressure was high here today in the emergency department.  Please follow-up with your family doctor and have it rechecked hopefully within a week.  Also take tylenol '1000mg'$ (2 extra strength) four times a day.   Then take the pain medicine if you feel like you need it. Narcotics do not help with the pain, they only make you care about it less.  You can become addicted to this, people may break into your house to steal it.  It will constipate you.  If you drive under the influence of this medicine you can get a DUI.

## 2020-11-04 NOTE — Progress Notes (Signed)
   11/04/20 1630  Clinical Encounter Type  Visited With Patient not available  Visit Type Trauma  Referral From Nurse  Consult/Referral To Chaplain   Chaplain responded. The patient is being attended to by the medical team.  A support person is also at the bedside. Chaplain remains available for follow-up spiritual/emotional support as needed.This note was prepared by Jeanine Luz, M.Div..  For questions please contact by phone [phone].

## 2020-11-04 NOTE — ED Triage Notes (Signed)
Came from UC. Fell last night while walking her dog. Landed to face first. On Plavix. BP 200/100. Called as a Level II trauma. Alert and oriented x 4.

## 2020-11-04 NOTE — Discharge Instructions (Addendum)
Patient was sent to the hospital via ambulance.

## 2020-11-04 NOTE — ED Notes (Signed)
Non-emergent ambulance called approx 15-43mns ago per provider. Rn checked on pt who now c/o headache.

## 2020-11-04 NOTE — ED Notes (Signed)
Taken to CT by Delia Chimes.

## 2020-11-10 DIAGNOSIS — S62315A Displaced fracture of base of fourth metacarpal bone, left hand, initial encounter for closed fracture: Secondary | ICD-10-CM | POA: Diagnosis not present

## 2020-11-17 DIAGNOSIS — S62315A Displaced fracture of base of fourth metacarpal bone, left hand, initial encounter for closed fracture: Secondary | ICD-10-CM | POA: Diagnosis not present

## 2020-11-18 ENCOUNTER — Other Ambulatory Visit: Payer: Self-pay | Admitting: Interventional Cardiology

## 2020-11-30 DIAGNOSIS — S62315D Displaced fracture of base of fourth metacarpal bone, left hand, subsequent encounter for fracture with routine healing: Secondary | ICD-10-CM | POA: Diagnosis not present

## 2020-11-30 DIAGNOSIS — S62315A Displaced fracture of base of fourth metacarpal bone, left hand, initial encounter for closed fracture: Secondary | ICD-10-CM | POA: Diagnosis not present

## 2020-12-13 DIAGNOSIS — S70362A Insect bite (nonvenomous), left thigh, initial encounter: Secondary | ICD-10-CM | POA: Diagnosis not present

## 2020-12-13 DIAGNOSIS — I10 Essential (primary) hypertension: Secondary | ICD-10-CM | POA: Diagnosis not present

## 2020-12-13 DIAGNOSIS — W57XXXA Bitten or stung by nonvenomous insect and other nonvenomous arthropods, initial encounter: Secondary | ICD-10-CM | POA: Diagnosis not present

## 2020-12-22 ENCOUNTER — Other Ambulatory Visit: Payer: Self-pay | Admitting: Internal Medicine

## 2020-12-22 DIAGNOSIS — S62315D Displaced fracture of base of fourth metacarpal bone, left hand, subsequent encounter for fracture with routine healing: Secondary | ICD-10-CM | POA: Diagnosis not present

## 2020-12-22 DIAGNOSIS — M8580 Other specified disorders of bone density and structure, unspecified site: Secondary | ICD-10-CM

## 2021-01-11 ENCOUNTER — Inpatient Hospital Stay: Payer: HMO | Admitting: Family

## 2021-01-11 ENCOUNTER — Inpatient Hospital Stay: Payer: HMO

## 2021-01-12 ENCOUNTER — Telehealth: Payer: Self-pay | Admitting: Family

## 2021-01-26 DIAGNOSIS — S62315A Displaced fracture of base of fourth metacarpal bone, left hand, initial encounter for closed fracture: Secondary | ICD-10-CM | POA: Diagnosis not present

## 2021-02-02 DIAGNOSIS — R52 Pain, unspecified: Secondary | ICD-10-CM | POA: Diagnosis not present

## 2021-02-02 DIAGNOSIS — S62315A Displaced fracture of base of fourth metacarpal bone, left hand, initial encounter for closed fracture: Secondary | ICD-10-CM | POA: Diagnosis not present

## 2021-02-02 DIAGNOSIS — M25642 Stiffness of left hand, not elsewhere classified: Secondary | ICD-10-CM | POA: Diagnosis not present

## 2021-02-07 ENCOUNTER — Other Ambulatory Visit: Payer: Self-pay

## 2021-02-07 DIAGNOSIS — I6523 Occlusion and stenosis of bilateral carotid arteries: Secondary | ICD-10-CM

## 2021-02-08 ENCOUNTER — Inpatient Hospital Stay: Payer: HMO | Attending: Hematology & Oncology

## 2021-02-08 ENCOUNTER — Inpatient Hospital Stay: Payer: HMO | Admitting: Family

## 2021-02-22 ENCOUNTER — Ambulatory Visit: Payer: HMO | Admitting: Physician Assistant

## 2021-02-22 ENCOUNTER — Ambulatory Visit (HOSPITAL_COMMUNITY)
Admission: RE | Admit: 2021-02-22 | Discharge: 2021-02-22 | Disposition: A | Discharge status: Home / Self Care | Payer: HMO | Source: Ambulatory Visit | Attending: Physician Assistant | Admitting: Physician Assistant

## 2021-02-22 ENCOUNTER — Other Ambulatory Visit: Payer: Self-pay

## 2021-02-22 VITALS — BP 140/72 | HR 59 | Temp 97.7°F | Resp 18 | Ht 61.0 in | Wt 118.4 lb

## 2021-02-22 DIAGNOSIS — I6523 Occlusion and stenosis of bilateral carotid arteries: Secondary | ICD-10-CM | POA: Diagnosis not present

## 2021-02-22 NOTE — Progress Notes (Signed)
Office Note     CC:  follow up Requesting Provider:  Wenda Low, MD  HPI: [name] is a 71 y.o. (1950-10-17) female who presents for surveillance of carotid artery stenosis.  Surgical history significant for left carotid endarterectomy in June 2018 by Dr. Donnetta Hutching.  She also has history of right carotid endarterectomy in 2007.  Since she was last seen in the office in March 2021 she is not had any CVA or TIA diagnosis.  She did also denies any current strokelike symptoms including slurring speech, changes in vision, or one-sided weakness.  She had quit smoking for 6 months last year however started back up in October.  She has quit again on January 1.  She is on Plavix daily.  She also has polycythemia vera managed by Dr. Marin Olp which requires phlebotomy about every 8 to 9 months.   Past Medical History:  Diagnosis Date   Anxiety    Arthritis    CAD (coronary artery disease)    a. s/p LCX stent 2010 with residual disease.   Carotid artery disease (HCC)    CHF (congestive heart failure) (HCC)    COPD (chronic obstructive pulmonary disease) (HCC)    Depression    GERD (gastroesophageal reflux disease)    Hepatitis C    History of carotid endarterectomy    Hypertension    Noncompliance with medication regimen    Polycythemia    Polycythemia vera ([place])    PVD (peripheral vascular disease) ([place])    Seizures ([place]) 1970   x1- pt. reports that there was never any causative agent      Past Surgical History:  Procedure Laterality Date   2 stints  Aug 26,2010   Dr. Tawnya Crook   ABDOMINAL SURGERY  [phone]   for excessive bleeding from loss of pregnancy, required blood transfusion post procedure    CAROTID ENDARTERECTOMY  2007   cartoid endartherectomy  2007   CORONARY ANGIOPLASTY WITH STENT PLACEMENT  2010   CORONARY ANGIOPLASTY WITH STENT PLACEMENT  10/09/2008   ENDARTERECTOMY Left 07/24/2016   Procedure: ENDARTERECTOMY CAROTID;  Surgeon: [name], MD;  Location: [facility];  Service: Vascular;  Laterality: Left;   FLEXIBLE SIGMOIDOSCOPY N/A 03/22/2017   Procedure: Beryle Quant;  Surgeon: Carol Ada, MD;  Location: [facility];  Service: Endoscopy;  Laterality: N/A;   LYMPH NODE DISSECTION Left 1990's   benign    ORIF ELBOW FRACTURE Right 04/26/2018   Procedure: OPEN REDUCTION INTERNAL FIXATION (ORIF) ELBOW/OLECRANON FRACTURE;  Surgeon: Shona Needles, MD;  Location: [facility];  Service: Orthopedics;  Laterality: Right;   TUBAL LIGATION      Social History   Socioeconomic History   Marital status: Single    Spouse name: Not on file   Number of children: Not on file   Years of education: Not on file   Highest education level: Not on file  Occupational History   Not on file  Tobacco Use   Smoking status: Former    Packs/day: 0.50    Types: Cigarettes    Passive exposure: Never   Smokeless tobacco: Never  Vaping Use   Vaping Use: Every day   Start date: 08/07/2020  Substance and Sexual Activity   Alcohol use: Not Currently    Comment: socially occasionally   Drug use: Not Currently    Comment: has smoked cigarettes off and on   Sexual activity: Not Currently  Other Topics Concern   Not on file  Social History Narrative   **  Merged History Encounter **       ** Merged History Encounter **       Tobacco use cigarettes: Current smoker Frequency 1 PPD Estimated Pack - years :30 Smoking : yes  No Tobacco exposure No alcohol No exercise Occupation : employed, works at home for [employer], Health visitor   n Boys 1 Girls 1   Social Determinants of Radio broadcast assistant Strain: Not on file  Food Insecurity: Not on file  Transportation Needs: Not on file  Physical Activity: Not on file  Stress: Not on file  Social Connections: Not on file  Intimate Partner Violence: Not on file    Family History  Problem Relation Age of Onset   Heart disease Mother    Heart disease Father      Current Outpatient Medications  Medication Sig Dispense Refill   acetaminophen (TYLENOL) 325 MG tablet Take 325-650 mg by mouth every 6 (six) hours as needed for mild pain or headache.     ALPRAZolam (XANAX) 0.5 MG tablet Take 0.5 mg by mouth at bedtime.      amLODipine (NORVASC) 10 MG tablet 1 tablet     Ascorbic Acid (VITAMIN C PO) Take 2 tablets by mouth daily.     BINAXNOW COVID-19 AG HOME TEST KIT See admin instructions.     Biotin 1000 MCG tablet Take 1,000 mcg by mouth daily.     BREO ELLIPTA 100-25 MCG/INH AEPB INHALE ONE PUFF ONCE A DAY     clopidogrel (PLAVIX) 75 MG tablet Take 1 tablet by mouth once daily 90 tablet 3   clotrimazole-betamethasone (LOTRISONE) cream Apply 1 application topically 2 (two) times daily as needed (to affected area).     escitalopram (LEXAPRO) 10 MG tablet Take 5 mg by mouth daily. Patient is taking 5 MG daily     hydrochlorothiazide (HYDRODIURIL) 25 MG tablet Take 1 tablet by mouth once daily 90 tablet 3   lansoprazole (PREVACID) 30 MG capsule Take 30 mg by mouth daily as needed (for ulcer/acid reflex). Ulcer/ acid reflux     metoprolol succinate (TOPROL-XL) 100 MG 24 hr tablet TAKE 1 & 1/2 (ONE & ONE-HALF) TABLETS BY MOUTH ONCE DAILY . Please make yearly appt with [name] for May for future refills. 1st attempt 135 tablet 1   morphine (MSIR) 15 MG tablet Take 0.5 tablets (7.5 mg total) by mouth every 4 (four) hours as needed for severe pain. 5 tablet 0   nitroGLYCERIN (NITROSTAT) 0.4 MG SL tablet      rosuvastatin (CRESTOR) 10 MG tablet Take 1 tablet (10 mg total) by mouth every other day. 90 tablet 3   telmisartan (MICARDIS) 20 MG tablet Take 1 tablet by mouth once daily 90 tablet 3   vitamin C (ASCORBIC ACID) 500 MG tablet Take 500-1,000 mg by mouth daily.     vitamin E 180 MG (400 UNITS) capsule Take 400 Units by mouth daily.     No current facility-administered medications for this visit.    Allergies  Allergen Reactions   Diphenhydramine  Hives   Sulfa Antibiotics Nausea And Vomiting   Nsaids     Other reaction(s): Unknown   Sulfa Antibiotics    Sulfur Other (See Comments)   Bactrim [Sulfamethoxazole-Trimethoprim] Nausea And Vomiting     REVIEW OF SYSTEMS:   '[X]'  denotes positive finding, '[ ]'  denotes negative finding Cardiac  Comments:  Chest pain or chest pressure:    Shortness of breath upon exertion:  Short of breath when lying flat:    Irregular heart rhythm:        Vascular    Pain in calf, thigh, or hip brought on by ambulation:    Pain in feet at night that wakes you up from your sleep:     Blood clot in your veins:    Leg swelling:         Pulmonary    Oxygen at home:    Productive cough:     Wheezing:         Neurologic    Sudden weakness in arms or legs:     Sudden numbness in arms or legs:     Sudden onset of difficulty speaking or slurred speech:    Temporary loss of vision in one eye:     Problems with dizziness:         Gastrointestinal    Blood in stool:     Vomited blood:         Genitourinary    Burning when urinating:     Blood in urine:        Psychiatric    Major depression:         Hematologic    Bleeding problems:    Problems with blood clotting too easily:        Skin    Rashes or ulcers:        Constitutional    Fever or chills:      PHYSICAL EXAMINATION:  Vitals:   02/22/21 1452 02/22/21 1458  BP: 136/78 140/72  Pulse: (!) 59   Resp: 18   Temp: 97.7 F (36.5 C)   TempSrc: Temporal   SpO2: 94%   Weight: 118 lb 6.4 oz (53.7 kg)   Height: '5\' 1"'  (1.549 m)     General:  WDWN in NAD; vital signs documented above Gait: Not observed HENT: WNL, normocephalic Pulmonary: normal non-labored breathing , without Rales, rhonchi,  wheezing Cardiac: regular HR Abdomen: soft, NT, no masses Skin: without rashes Extremities: without ischemic changes, without Gangrene , without cellulitis; without open wounds;  Musculoskeletal: no muscle wasting or  atrophy  Neurologic: A&O X 3;  CN grossly intact Psychiatric:  The pt has Normal affect.   Non-Invasive Vascular Imaging:   Bilateral ICA 1 to 39% stenosis    ASSESSMENT/PLAN:: 71 y.o. female here for follow up for surveillance of carotid artery stenosis.  -Subjectively has not had any neurological symptoms since last office visit -Carotid duplex demonstrates 1 to 39% stenosis of bilateral internal carotid arteries -Encouraged continued smoking cessation -Recheck carotid duplex in another 18 months   Dagoberto Ligas, PA-C [facility] [phone]  Clinic MD:   Carlis Abbott

## 2021-04-07 DIAGNOSIS — N959 Unspecified menopausal and perimenopausal disorder: Secondary | ICD-10-CM | POA: Diagnosis not present

## 2021-04-18 ENCOUNTER — Encounter: Payer: Self-pay | Admitting: Family

## 2021-04-18 ENCOUNTER — Inpatient Hospital Stay: Payer: HMO | Admitting: Family

## 2021-04-18 ENCOUNTER — Inpatient Hospital Stay: Payer: HMO | Attending: Hematology & Oncology

## 2021-04-18 ENCOUNTER — Other Ambulatory Visit: Payer: Self-pay

## 2021-04-18 VITALS — BP 182/74 | HR 59 | Temp 98.7°F | Resp 20 | Wt 118.0 lb

## 2021-04-18 DIAGNOSIS — Z8619 Personal history of other infectious and parasitic diseases: Secondary | ICD-10-CM | POA: Insufficient documentation

## 2021-04-18 DIAGNOSIS — R079 Chest pain, unspecified: Secondary | ICD-10-CM | POA: Diagnosis not present

## 2021-04-18 DIAGNOSIS — D751 Secondary polycythemia: Secondary | ICD-10-CM

## 2021-04-18 DIAGNOSIS — Z7982 Long term (current) use of aspirin: Secondary | ICD-10-CM | POA: Insufficient documentation

## 2021-04-18 DIAGNOSIS — Z79899 Other long term (current) drug therapy: Secondary | ICD-10-CM | POA: Insufficient documentation

## 2021-04-18 DIAGNOSIS — R519 Headache, unspecified: Secondary | ICD-10-CM | POA: Diagnosis not present

## 2021-04-18 DIAGNOSIS — R42 Dizziness and giddiness: Secondary | ICD-10-CM | POA: Insufficient documentation

## 2021-04-18 DIAGNOSIS — D500 Iron deficiency anemia secondary to blood loss (chronic): Secondary | ICD-10-CM

## 2021-04-18 DIAGNOSIS — R0602 Shortness of breath: Secondary | ICD-10-CM | POA: Insufficient documentation

## 2021-04-18 DIAGNOSIS — D45 Polycythemia vera: Secondary | ICD-10-CM | POA: Diagnosis not present

## 2021-04-18 DIAGNOSIS — F17210 Nicotine dependence, cigarettes, uncomplicated: Secondary | ICD-10-CM | POA: Insufficient documentation

## 2021-04-18 LAB — CBC WITH DIFFERENTIAL (CANCER CENTER ONLY)
Abs Immature Granulocytes: 0.02 10*3/uL (ref 0.00–0.07)
Basophils Absolute: 0 10*3/uL (ref 0.0–0.1)
Basophils Relative: 0 %
Eosinophils Absolute: 0.1 10*3/uL (ref 0.0–0.5)
Eosinophils Relative: 1 %
HCT: 44 % (ref 36.0–46.0)
Hemoglobin: 15.1 g/dL — ABNORMAL HIGH (ref 12.0–15.0)
Immature Granulocytes: 0 %
Lymphocytes Relative: 26 %
Lymphs Abs: 1.9 10*3/uL (ref 0.7–4.0)
MCH: 32.8 pg (ref 26.0–34.0)
MCHC: 34.3 g/dL (ref 30.0–36.0)
MCV: 95.7 fL (ref 80.0–100.0)
Monocytes Absolute: 0.6 10*3/uL (ref 0.1–1.0)
Monocytes Relative: 9 %
Neutro Abs: 4.5 10*3/uL (ref 1.7–7.7)
Neutrophils Relative %: 64 %
Platelet Count: 250 10*3/uL (ref 150–400)
RBC: 4.6 MIL/uL (ref 3.87–5.11)
RDW: 12 % (ref 11.5–15.5)
WBC Count: 7.1 10*3/uL (ref 4.0–10.5)
nRBC: 0 % (ref 0.0–0.2)

## 2021-04-18 LAB — CMP (CANCER CENTER ONLY)
ALT: 13 U/L (ref 0–44)
AST: 20 U/L (ref 15–41)
Albumin: 4 g/dL (ref 3.5–5.0)
Alkaline Phosphatase: 69 U/L (ref 38–126)
Anion gap: 5 (ref 5–15)
BUN: 15 mg/dL (ref 8–23)
CO2: 32 mmol/L (ref 22–32)
Calcium: 9.4 mg/dL (ref 8.9–10.3)
Chloride: 97 mmol/L — ABNORMAL LOW (ref 98–111)
Creatinine: 0.88 mg/dL (ref 0.44–1.00)
GFR, Estimated: >60 mL/min (ref >60)
Glucose, Bld: 117 mg/dL — ABNORMAL HIGH (ref 70–99)
Potassium: 4.2 mmol/L (ref 3.5–5.1)
Sodium: 134 mmol/L — ABNORMAL LOW (ref 135–145)
Total Bilirubin: 0.5 mg/dL (ref 0.3–1.2)
Total Protein: 6.8 g/dL (ref 6.5–8.1)

## 2021-04-18 NOTE — Progress Notes (Signed)
Hematology and Oncology Follow Up Visit  [name] [id number] 1950/11/14 71 y.o. 04/18/2021   Principle Diagnosis:  Polycythemia vera- JAK2 negative Hepatitis C - clinical remission     Current Therapy:        Phlebotomy to maintain hematocrit less than 45% - last in January 2020     *Prefers to go to [facility] and have phlebotomy done by [facility], RN   Interim History:  [name] is here today for follow-up. She is under a great deal of stress at home right now and notes fatigue.  She has also had dizziness, headaches and an episodes of chest pain lasting 30 minutes over the weekend. She states that this stopped on it's own and she did not take a nitroglycerine tablet.  Her BP is 182/74, HR 59. She states that she only took a half dose of her HCTZ and Toprol XL but did not take her Telmisartan. She states that she will restart her antihypertensive medications as prescribed and start checking her BP at home daily and keep a record. She will contact her cardiologist as well to let them know about her recent symptoms.  No fever, chills, n/v, cough, rash, abdominal pain or changes in bowel or bladder habits.  She has SOB with exertion and states that with all the stress she started smoking 1 ppd again.  No swelling, tenderness in her extremities at this time.  She has tingling in the left hand since breaking it last fall.  No falls or syncope to report.  She has an ok appetite and is staying hydrated. Her weight is stable at 118 lbs.   ECOG Performance Status: 0 - Asymptomatic  Medications:  Allergies as of 04/18/2021       Reactions   Diphenhydramine Hives   Sulfa Antibiotics Nausea And Vomiting   Nsaids    Other reaction(s): Unknown   Sulfa Antibiotics    Sulfur Other (See Comments)   Bactrim [sulfamethoxazole-trimethoprim] Nausea And Vomiting        Medication List        Accurate as of April 18, 2021  2:52 PM. If you have any questions, ask your nurse  or doctor.          acetaminophen 325 MG tablet Commonly known as: TYLENOL Take 325-650 mg by mouth every 6 (six) hours as needed for mild pain or headache.   ALPRAZolam 0.5 MG tablet Commonly known as: XANAX Take 0.5 mg by mouth at bedtime.   amLODipine 10 MG tablet Commonly known as: NORVASC 1 tablet   BinaxNOW COVID-19 Ag Home Test Kit Generic drug: COVID-19 At Home Antigen Test See admin instructions.   Biotin 1000 MCG tablet Take 1,000 mcg by mouth daily.   Breo Ellipta 100-25 MCG/ACT Aepb Generic drug: fluticasone furoate-vilanterol INHALE ONE PUFF ONCE A DAY   clopidogrel 75 MG tablet Commonly known as: PLAVIX Take 1 tablet by mouth once daily   clotrimazole-betamethasone cream Commonly known as: LOTRISONE Apply 1 application topically 2 (two) times daily as needed (to affected area).   escitalopram 10 MG tablet Commonly known as: LEXAPRO Take 5 mg by mouth daily. Patient is taking 5 MG daily   hydrochlorothiazide 25 MG tablet Commonly known as: HYDRODIURIL Take 1 tablet by mouth once daily   lansoprazole 30 MG capsule Commonly known as: PREVACID Take 30 mg by mouth daily as needed (for ulcer/acid reflex). Ulcer/ acid reflux   metoprolol succinate 100 MG 24 hr tablet Commonly known as: TOPROL-XL  TAKE 1 & 1/2 (ONE & ONE-HALF) TABLETS BY MOUTH ONCE DAILY . Please make yearly appt with [name] for May for future refills. 1st attempt   morphine 15 MG tablet Commonly known as: MSIR Take 0.5 tablets (7.5 mg total) by mouth every 4 (four) hours as needed for severe pain.   nitroGLYCERIN 0.4 MG SL tablet Commonly known as: NITROSTAT   rosuvastatin 10 MG tablet Commonly known as: CRESTOR Take 1 tablet (10 mg total) by mouth every other day.   telmisartan 20 MG tablet Commonly known as: MICARDIS Take 1 tablet by mouth once daily   vitamin C 500 MG tablet Commonly known as: ASCORBIC ACID Take 500-1,000 mg by mouth daily.   VITAMIN C PO Take 2  tablets by mouth daily.   vitamin E 180 MG (400 UNITS) capsule Take 400 Units by mouth daily.        Allergies:  Allergies  Allergen Reactions   Diphenhydramine Hives   Sulfa Antibiotics Nausea And Vomiting   Nsaids     Other reaction(s): Unknown   Sulfa Antibiotics    Sulfur Other (See Comments)   Bactrim [Sulfamethoxazole-Trimethoprim] Nausea And Vomiting    Past Medical History, Surgical history, Social history, and Family History were reviewed and updated.  Review of Systems: All other 10 point review of systems is negative.   Physical Exam:  vitals were not taken for this visit.   Wt Readings from Last 3 Encounters:  02/22/21 118 lb 6.4 oz (53.7 kg)  11/04/20 118 lb (53.5 kg)  10/29/20 118 lb 12.8 oz (53.9 kg)    Ocular: Sclerae unicteric, pupils equal, round and reactive to light Ear-nose-throat: Oropharynx clear, dentition fair Lymphatic: No cervical or supraclavicular adenopathy Lungs no rales or rhonchi, good excursion bilaterally Heart regular rate and rhythm, no murmur appreciated Abd soft, nontender, positive bowel sounds MSK no focal spinal tenderness, no joint edema Neuro: non-focal, well-oriented, appropriate affect Breasts: Deferred   Lab Results  Component Value Date   WBC 6.0 10/12/2020   HGB 14.6 10/12/2020   HCT 43.2 10/12/2020   MCV 95.8 10/12/2020   PLT 263 10/12/2020   Lab Results  Component Value Date   FERRITIN 48 10/12/2020   IRON 146 (H) 10/12/2020   TIBC 421 10/12/2020   UIBC 275 10/12/2020   IRONPCTSAT 35 10/12/2020   Lab Results  Component Value Date   RETICCTPCT 1.2 08/03/2010   RBC 4.51 10/12/2020   RETICCTABS 61.3 08/03/2010   No results found for: KPAFRELGTCHN, LAMBDASER, KAPLAMBRATIO No results found for: IGGSERUM, IGA, IGMSERUM No results found for: Odetta Pink, SPEI   Chemistry      Component Value Date/Time   NA 136 10/12/2020 1309   NA 137 04/01/2019  1649   NA 139 01/03/2017 1327   NA 136 12/28/2015 1311   K 4.7 10/12/2020 1309   K 3.7 01/03/2017 1327   K 4.9 12/28/2015 1311   CL 96 (L) 10/12/2020 1309   CL 94 (L) 01/03/2017 1327   CO2 31 10/12/2020 1309   CO2 31 01/03/2017 1327   CO2 28 12/28/2015 1311   BUN 14 10/12/2020 1309   BUN 14 04/01/2019 1649   BUN 12 01/03/2017 1327   BUN 10.4 12/28/2015 1311   CREATININE 0.94 10/12/2020 1309   CREATININE 1.0 01/03/2017 1327   CREATININE 0.9 12/28/2015 1311      Component Value Date/Time   CALCIUM 10.0 10/12/2020 1309   CALCIUM 8.8 01/03/2017 1327  CALCIUM 9.6 12/28/2015 1311   ALKPHOS 71 10/12/2020 1309   ALKPHOS 85 (H) 01/03/2017 1327   ALKPHOS 83 12/28/2015 1311   AST 22 10/12/2020 1309   AST 25 12/28/2015 1311   ALT 14 10/12/2020 1309   ALT 19 01/03/2017 1327   ALT 13 12/28/2015 1311   BILITOT 0.7 10/12/2020 1309   BILITOT 0.74 12/28/2015 1311       Impression and Plan: Ms. Bolding is a 70 yo caucasian female with polycythemia.  No phlebotomy needed, Hct 44.0.  Follow-up in 3 months.  Lottie Dawson, NP 2/27/20232:52 PM

## 2021-04-19 LAB — IRON AND IRON BINDING CAPACITY (CC-WL,HP ONLY)
Iron: 126 ug/dL (ref 28–170)
Saturation Ratios: 28 % (ref 10.4–31.8)
TIBC: 448 ug/dL (ref 250–450)
UIBC: 322 ug/dL (ref 148–442)

## 2021-04-19 LAB — FERRITIN: Ferritin: 31 ng/mL (ref 11–307)

## 2021-04-22 ENCOUNTER — Telehealth: Payer: Self-pay | Admitting: *Deleted

## 2021-04-22 NOTE — Telephone Encounter (Signed)
Per 04/18/21 los - called and lvm of upcoming appointments - requested call back to confirm ?

## 2021-04-24 ENCOUNTER — Other Ambulatory Visit: Payer: Self-pay

## 2021-04-24 ENCOUNTER — Emergency Department (HOSPITAL_COMMUNITY)
Admission: EM | Admit: 2021-04-24 | Discharge: 2021-04-24 | Disposition: A | Discharge status: Home / Self Care | Payer: HMO | Attending: Emergency Medicine | Admitting: Emergency Medicine

## 2021-04-24 ENCOUNTER — Encounter (HOSPITAL_COMMUNITY): Payer: Self-pay

## 2021-04-24 DIAGNOSIS — Z79899 Other long term (current) drug therapy: Secondary | ICD-10-CM | POA: Insufficient documentation

## 2021-04-24 DIAGNOSIS — I509 Heart failure, unspecified: Secondary | ICD-10-CM | POA: Insufficient documentation

## 2021-04-24 DIAGNOSIS — D582 Other hemoglobinopathies: Secondary | ICD-10-CM | POA: Diagnosis not present

## 2021-04-24 DIAGNOSIS — I2510 Atherosclerotic heart disease of native coronary artery without angina pectoris: Secondary | ICD-10-CM | POA: Diagnosis not present

## 2021-04-24 DIAGNOSIS — Z7902 Long term (current) use of antithrombotics/antiplatelets: Secondary | ICD-10-CM | POA: Insufficient documentation

## 2021-04-24 DIAGNOSIS — I10 Essential (primary) hypertension: Secondary | ICD-10-CM | POA: Diagnosis not present

## 2021-04-24 DIAGNOSIS — I160 Hypertensive urgency: Secondary | ICD-10-CM

## 2021-04-24 DIAGNOSIS — G44209 Tension-type headache, unspecified, not intractable: Secondary | ICD-10-CM | POA: Diagnosis not present

## 2021-04-24 DIAGNOSIS — I110 Hypertensive heart disease with heart failure: Secondary | ICD-10-CM | POA: Diagnosis not present

## 2021-04-24 DIAGNOSIS — R61 Generalized hyperhidrosis: Secondary | ICD-10-CM | POA: Diagnosis not present

## 2021-04-24 DIAGNOSIS — G4489 Other headache syndrome: Secondary | ICD-10-CM | POA: Diagnosis not present

## 2021-04-24 DIAGNOSIS — J449 Chronic obstructive pulmonary disease, unspecified: Secondary | ICD-10-CM | POA: Insufficient documentation

## 2021-04-24 DIAGNOSIS — R0789 Other chest pain: Secondary | ICD-10-CM | POA: Diagnosis not present

## 2021-04-24 DIAGNOSIS — R079 Chest pain, unspecified: Secondary | ICD-10-CM | POA: Diagnosis not present

## 2021-04-24 DIAGNOSIS — R109 Unspecified abdominal pain: Secondary | ICD-10-CM | POA: Diagnosis not present

## 2021-04-24 DIAGNOSIS — R519 Headache, unspecified: Secondary | ICD-10-CM | POA: Diagnosis present

## 2021-04-24 LAB — CBC WITH DIFFERENTIAL/PLATELET
Abs Immature Granulocytes: 0.01 10*3/uL (ref 0.00–0.07)
Basophils Absolute: 0 10*3/uL (ref 0.0–0.1)
Basophils Relative: 1 %
Eosinophils Absolute: 0 10*3/uL (ref 0.0–0.5)
Eosinophils Relative: 1 %
HCT: 48.2 % — ABNORMAL HIGH (ref 36.0–46.0)
Hemoglobin: 16.2 g/dL — ABNORMAL HIGH (ref 12.0–15.0)
Immature Granulocytes: 0 %
Lymphocytes Relative: 25 %
Lymphs Abs: 1.7 10*3/uL (ref 0.7–4.0)
MCH: 31.8 pg (ref 26.0–34.0)
MCHC: 33.6 g/dL (ref 30.0–36.0)
MCV: 94.7 fL (ref 80.0–100.0)
Monocytes Absolute: 0.6 10*3/uL (ref 0.1–1.0)
Monocytes Relative: 10 %
Neutro Abs: 4.2 10*3/uL (ref 1.7–7.7)
Neutrophils Relative %: 63 %
Platelets: 289 10*3/uL (ref 150–400)
RBC: 5.09 MIL/uL (ref 3.87–5.11)
RDW: 11.8 % (ref 11.5–15.5)
WBC: 6.6 10*3/uL (ref 4.0–10.5)
nRBC: 0 % (ref 0.0–0.2)

## 2021-04-24 LAB — COMPREHENSIVE METABOLIC PANEL
ALT: 13 U/L (ref 0–44)
AST: 32 U/L (ref 15–41)
Albumin: 4.1 g/dL (ref 3.5–5.0)
Alkaline Phosphatase: 63 U/L (ref 38–126)
Anion gap: 10 (ref 5–15)
BUN: 11 mg/dL (ref 8–23)
CO2: 26 mmol/L (ref 22–32)
Calcium: 9.2 mg/dL (ref 8.9–10.3)
Chloride: 94 mmol/L — ABNORMAL LOW (ref 98–111)
Creatinine, Ser: 0.7 mg/dL (ref 0.44–1.00)
GFR, Estimated: >60 mL/min (ref >60)
Glucose, Bld: 117 mg/dL — ABNORMAL HIGH (ref 70–99)
Potassium: 4.7 mmol/L (ref 3.5–5.1)
Sodium: 130 mmol/L — ABNORMAL LOW (ref 135–145)
Total Bilirubin: 1.6 mg/dL — ABNORMAL HIGH (ref 0.3–1.2)
Total Protein: 7.1 g/dL (ref 6.5–8.1)

## 2021-04-24 LAB — URINALYSIS, ROUTINE W REFLEX MICROSCOPIC
Bacteria, UA: NONE SEEN
Bilirubin Urine: NEGATIVE
Glucose, UA: NEGATIVE mg/dL
Hgb urine dipstick: NEGATIVE
Ketones, ur: NEGATIVE mg/dL
Leukocytes,Ua: NEGATIVE
Nitrite: NEGATIVE
Protein, ur: NEGATIVE mg/dL
Specific Gravity, Urine: 1.008 (ref 1.005–1.030)
pH: 8 (ref 5.0–8.0)

## 2021-04-24 LAB — TROPONIN I (HIGH SENSITIVITY): Troponin I (High Sensitivity): 7 ng/L (ref <18)

## 2021-04-24 LAB — BRAIN NATRIURETIC PEPTIDE: B Natriuretic Peptide: 84.6 pg/mL (ref 0.0–100.0)

## 2021-04-24 MED ORDER — HYDRALAZINE HCL 20 MG/ML IJ SOLN
10.0000 mg | Freq: Once | INTRAMUSCULAR | Status: AC
  Administered 2021-04-24: 10 mg via INTRAVENOUS
  Filled 2021-04-24: qty 1

## 2021-04-24 MED ORDER — ACETAMINOPHEN 500 MG PO TABS
1000.0000 mg | ORAL_TABLET | Freq: Once | ORAL | Status: AC
Start: 2021-04-24 — End: 2021-04-24
  Administered 2021-04-24: 1000 mg via ORAL
  Filled 2021-04-24: qty 2

## 2021-04-24 NOTE — Discharge Instructions (Signed)
You were evaluated in the Emergency Department and after careful evaluation, we did not find any emergent condition requiring admission or further testing in the hospital. ? ?Your exam/testing today was overall reassuring.  He was no evidence of hypertensive emergency on your laboratory work-up. ? ?Please return to the Emergency Department if you experience any worsening of your condition.  Thank you for allowing [place] to be a part of your care. ? ?

## 2021-04-24 NOTE — ED Notes (Signed)
Pt stated took medication around 1030 am today. ? ?25 mg Hydralazine around  ?150 metoprolol  ?Plavix ?Lexapro ?

## 2021-04-24 NOTE — ED Triage Notes (Signed)
Pt reports intermittent episodes of sweating and increased stress since Wednesday. Hx of cardiac stents over 15 years ago. Denies chest pain or SHOB. Pt reports uncontrolled HTN since then. SBP ranging from 170s-200. Pt reports she has been compliant with home BP meds and has taken them prior to arrival. EMS administered 1 nitro for BP control, denies chest pain. Pt also reports left flank pain with urinary frequency and cloudiness.  ?

## 2021-04-24 NOTE — ED Notes (Signed)
Pt ambulated to restroom with steady gait.

## 2021-04-24 NOTE — ED Provider Notes (Addendum)
[facility] EMERGENCY DEPARTMENT Provider Note   CSN: [id number] Arrival date & time: 04/24/21  1133     History  Chief Complaint  Patient presents with   Hypertension    [name] is a 71 y.o. female.   Hypertension Associated symptoms include headaches.  Flank Pain Associated symptoms include headaches.  71 year old female with history of polycythemia, PVD, hepatitis C, CAD status post left circumflex stent in 2010, GERD, COPD, anxiety, depression, carotid artery disease, CHF with last EF in 2021 of 60 to 32% with diastolic dysfunction who is presenting to the emergency department with a chief complaint of hypertension.  She states that she has had systolic blood pressures ranging between the 170s and 200s.  She denies any blurry vision, chest pain, shortness of breath, abdominal pain.  She states that she had an episode of sweatiness without chest pain, shortness of breath or palpitations.  She states that she has been under increased stress and is concerned about her blood pressure.  She takes 25 mg of hydrochlorothiazide daily, metoprolol, is on Plavix and Lexapro.  She denies any fevers or chills.  She denies any neurologic deficits.    Home Medications Prior to Admission medications   Medication Sig Start Date End Date Taking? Authorizing Provider  acetaminophen (TYLENOL) 325 MG tablet Take 325-650 mg by mouth every 6 (six) hours as needed for mild pain or headache.    [provider]  ALPRAZolam Duanne Moron) 0.5 MG tablet Take 0.5 mg by mouth at bedtime.  11/27/12   [provider]  Ascorbic Acid (VITAMIN C PO) Take 2 tablets by mouth daily.    [provider]  Biotin 1000 MCG tablet Take 1,000 mcg by mouth daily. Patient not taking: Reported on 04/18/2021    [provider]  BREO ELLIPTA 100-25 MCG/INH AEPB INHALE ONE PUFF ONCE A DAY 03/22/18   [provider]  clopidogrel (PLAVIX) 75 MG tablet Take 1  tablet by mouth once daily 11/18/20   Belva Crome, MD  clotrimazole-betamethasone (LOTRISONE) cream Apply 1 application topically 2 (two) times daily as needed (to affected area). Patient not taking: Reported on 04/18/2021    [provider]  escitalopram (LEXAPRO) 10 MG tablet Take 10 mg by mouth daily. Patient is taking 5 MG daily    [provider]  hydrochlorothiazide (HYDRODIURIL) 25 MG tablet Take 1 tablet by mouth once daily 05/31/20   Belva Crome, MD  lansoprazole (PREVACID) 30 MG capsule Take 30 mg by mouth daily as needed (for ulcer/acid reflex). Ulcer/ acid reflux    [provider]  metoprolol succinate (TOPROL-XL) 100 MG 24 hr tablet TAKE 1 & 1/2 (ONE & ONE-HALF) TABLETS BY MOUTH ONCE DAILY . Please make yearly appt with [name] for [place] for future refills. 1st attempt 03/14/19   Belva Crome, MD  nitroGLYCERIN (NITROSTAT) 0.4 MG SL tablet     [provider]  rosuvastatin (CRESTOR) 10 MG tablet Take 1 tablet (10 mg total) by mouth every other day. 04/01/19   Dunn, Nedra Hai, PA-C  telmisartan (MICARDIS) 20 MG tablet Take 1 tablet by mouth once daily 07/21/20   Belva Crome, MD  vitamin E 180 MG (400 UNITS) capsule Take 400 Units by mouth daily. Patient not taking: Reported on 04/18/2021    [provider]      Allergies    Diphenhydramine, Sulfa antibiotics, Nsaids, Sulfa antibiotics, Sulfur, and Bactrim [sulfamethoxazole-trimethoprim]    Review  of Systems   Review of Systems  Constitutional:  Positive for diaphoresis.  Neurological:  Positive for headaches.  Psychiatric/Behavioral:  The patient is nervous/anxious.   All other systems reviewed and are negative.  Physical Exam Updated Vital Signs BP (!) 146/78    Pulse (!) 49    Temp 98.3 F (36.8 C)    Resp 16    Ht '5\' 1"'$  (1.549 m)    Wt 53.5 kg    SpO2 99%    BMI 22.30 kg/m  Physical Exam Vitals and nursing note reviewed.  Constitutional:      General: She is not in acute  distress.    Appearance: She is well-developed.  HENT:     Head: Normocephalic and atraumatic.     Mouth/Throat:     Mouth: Mucous membranes are moist.  Eyes:     Conjunctiva/sclera: Conjunctivae normal.  Neck:     Vascular: No JVD.  Cardiovascular:     Rate and Rhythm: Normal rate and regular rhythm.     Pulses: Normal pulses.     Heart sounds: Normal heart sounds. No murmur heard. Pulmonary:     Effort: Pulmonary effort is normal. No respiratory distress.     Breath sounds: Normal breath sounds.  Abdominal:     Palpations: Abdomen is soft.     Tenderness: There is no abdominal tenderness.  Musculoskeletal:        General: No swelling.     Cervical back: Neck supple.     Right lower leg: No edema.     Left lower leg: No edema.  Skin:    General: Skin is warm and dry.     Capillary Refill: Capillary refill takes less than 2 seconds.  Neurological:     General: No focal deficit present.     Mental Status: She is alert and oriented to person, place, and time. Mental status is at baseline.     Cranial Nerves: No cranial nerve deficit.     Sensory: No sensory deficit.     Motor: No weakness.     Gait: Gait normal.  Psychiatric:        Mood and Affect: Mood normal.    ED Results / Procedures / Treatments   Labs (all labs ordered are listed, but only abnormal results are displayed) Labs Reviewed  COMPREHENSIVE METABOLIC PANEL - Abnormal; Notable for the following components:      Result Value   Sodium 130 (*)    Chloride 94 (*)    Glucose, Bld 117 (*)    Total Bilirubin 1.6 (*)    All other components within normal limits  CBC WITH DIFFERENTIAL/PLATELET - Abnormal; Notable for the following components:   Hemoglobin 16.2 (*)    HCT 48.2 (*)    All other components within normal limits  URINE CULTURE  URINALYSIS, ROUTINE W REFLEX MICROSCOPIC  BRAIN NATRIURETIC PEPTIDE  TROPONIN I (HIGH SENSITIVITY)    EKG EKG Interpretation  Date/Time:  Sunday April 24 2021  11:46:13 EST Ventricular Rate:  53 PR Interval:  192 QRS Duration: 91 QT Interval:  473 QTC Calculation: 445 R Axis:   77 Text Interpretation: Sinus bradycardia Consider left ventricular hypertrophy Confirmed by Regan Lemming ([id number]) on 04/24/2021 12:06:45 PM  Radiology No results found.  Procedures Procedures    Medications Ordered in ED Medications  hydrALAZINE (APRESOLINE) injection 10 mg (10 mg Intravenous Given 04/24/21 1303)  acetaminophen (TYLENOL) tablet 1,000 mg (1,000 mg Oral Given 04/24/21 1410)  ED Course/ Medical Decision Making/ A&P                           Medical Decision Making Amount and/or Complexity of Data Reviewed Labs: ordered.  Risk OTC drugs. Prescription drug management.   71 year old female with history of polycythemia, PVD, hepatitis C, CAD status post left circumflex stent in 2010, GERD, COPD, anxiety, depression, carotid artery disease, CHF with last EF in 2021 of 60 to 37% with diastolic dysfunction who is presenting to the emergency department with a chief complaint of hypertension.  She states that she has had systolic blood pressures ranging between the 170s and 200s.  She denies any blurry vision, chest pain, shortness of breath, abdominal pain.  She states that she had an episode of sweatiness without chest pain, shortness of breath or palpitations.  She states that she has been under increased stress and is concerned about her blood pressure.  She takes 25 mg of hydrochlorothiazide daily, metoprolol, is on Plavix and Lexapro.  She denies any fevers or chills.  She denies any neurologic deficits.  On arrival, the patient was afebrile, mildly bradycardic P56, hypertensive BP 177/77, saturating well on room air.  The patient presents with concern for an episode of diaphoresis without evidence of ACS with no chest pain, palpitations, shortness of breath.  Given her bradycardia and diaphoresis, considered vagal episode which is since resolved.  The  patient was concerned about her blood pressure but she currently presents with asymptomatic hypertension.  Low suspicion for ACS, PE, cardiogenic arrhythmia, hypertensive emergency, CVA.  Lungs clear to auscultation bilaterally with no evidence of pulmonary edema.  Low suspicion for CHF exacerbation with no JVD, no lower extremity edema.  Work-up initiated to include an EKG which revealed sinus bradycardia, rate 53, no abnormal intervals, no ischemic changes, CBC without a leukocytosis or anemia, mildly elevated hemoglobin to 16.2 with hematocrit 48.2, initial troponin 7, BMP normal, CMP generally unremarkable, urinalysis negative.  No evidence of endorgan damage at this time.  The patient was administered Tylenol for headache and hydralazine 10 mg IV with subsequent improvement in her blood pressure and resolution of symptoms.  On reassessment, the patient was feeling symptomatically improved and denied any complaints, stating she felt much better and requesting discharge.  Given improvement in symptoms, lower concern for hyperviscosity syndrome at this time.  Overall stable for discharge at this time with a plan for outpatient PCP follow-up to discuss continued blood pressure management.    Final Clinical Impression(s) / ED Diagnoses Final diagnoses:  Hypertension, unspecified type  Hypertensive urgency  Acute non intractable tension-type headache    Rx / DC Orders ED Discharge Orders     None           Regan Lemming, MD 04/26/21 1123    Regan Lemming, MD 04/26/21 1127

## 2021-04-24 NOTE — ED Notes (Signed)
Pt stated she is ready to go d/t BP seems fine now. She would like some Tylenol for a headache. Notified EDP ?

## 2021-04-25 ENCOUNTER — Telehealth: Payer: Self-pay | Admitting: *Deleted

## 2021-04-25 ENCOUNTER — Telehealth: Payer: Self-pay | Admitting: Interventional Cardiology

## 2021-04-25 LAB — URINE CULTURE: Culture: NO GROWTH

## 2021-04-25 NOTE — Telephone Encounter (Signed)
?  Pt c/o BP issue: STAT if pt c/o blurred vision, one-sided weakness or slurred speech ? ?1. What are your last 5 BP readings?  ?208/106 HR 56 ?200/112 HR 64 ?190/104 HR 66 ?213/101 HR 57 ?206/94   HR55 ? ? ?2. Are you having any other symptoms (ex. Dizziness, headache, blurred vision, passed out)? Was having dizziness over the weekend.   ? ?3. What is your BP issue? Patient called stating she was in the emergency room over the weekend because of her BP. It was 213/104 yesterday when she called the ambulance, she states it's been over 200 since Friday.  ?

## 2021-04-25 NOTE — Telephone Encounter (Signed)
Call received from patient stating that she was in the ER over the weekend with elevated BP, her Hct was elevated at 48 during that visit and would like to know if she needs a phlebotomy. Pt requests phlebotomy to be done at [facility]. [place] NP notified and order received for pt to have a phlebotomy at [facility].  Message sent to scheduling and pt notified. Pt is appreciative of assistance and has no further questions at this time.  ?

## 2021-04-25 NOTE — Telephone Encounter (Signed)
Pt states BP this morning was 208/106, HR 56.  This was prior to medications.  Took meds about an hour ago. Checked BP while on the phone with me and states it was 200/100, 58.  Pt was talking briefly while BP monitor was in use.  Advised pt to continue to monitor BP since it has only been an hour since she took her meds.  States she was given Hydralazine in the hospital and BP improved.  Pt also mentioned that she has polycythemia and her "blood is up right now".  Mentioned that she needs to have "phlebotomy done".  She has a call out to her doctor that treats this.  She wasn't sure if that could be the cause of the HTN.  Scheduled pt to come in Friday to see [name], PA-C.  Advised I will send message to [name] to see if he wants to change anything prior to appt on Friday.  ?

## 2021-04-27 ENCOUNTER — Telehealth: Payer: Self-pay | Admitting: Family

## 2021-04-27 NOTE — Telephone Encounter (Signed)
.  Called patient to schedule appointment per 3/8 inbasket, patient is aware of date and time.   ?

## 2021-04-28 NOTE — Progress Notes (Deleted)
?Cardiology Office Note:   ? ?Date:  04/28/2021  ? ?ID:  [name], DOB January 22, 1951, MRN [id number] ? ?PCP:  Wenda Low, MD  ?[facility] HeartCare Providers ?Cardiologist:  Sinclair Grooms, MD { ?Click to update primary MD,subspecialty MD or APP then REFRESH:1}  *** ?Referring MD: Wenda Low, MD  ? ?Chief Complaint:  No chief complaint on file. ?{Click here for Visit Info    :1}  ? ?Patient Profile: ?Coronary artery disease s/p DES to the LCx in 2010 ?Hypertension ?(HFpEF) heart failure with preserved ejection fraction  ?Carotid artery disease ?S/p left CEA 6/18 ?S/p right CEA in 2007 ?Polycythemia vera ?Tobacco abuse ?COPD ?GERD ?Anxiety ?Hepatitis C ?Peripheral arterial disease ? ?Prior CV Studies: ?Carotid US 02/22/2021 ?Bilateral ICA 1-39; bilateral subclavian stenosis ? ?Myoview 05/05/2020 ?EF 47, no ischemia or infarction; low risk ? ?Event monitor 06/05/2019 ?NSR; no arrhythmia, no A-fib ? ?Echocardiogram 04/14/2019 EF 60-65, no RWMA, GRII DD, normal RVSF, trivial MR ? ?Cardiac catheterization May 2016 ?LM normal ?LCx mid 90 ?LAD 30-40 ?RCA 60-70 ?EF 60 ?PCI: 2.5 x 23 mm Xience DES to the LCx ?{Select studies to display:26339}  ? ?History of Present Illness:   ?[name] is a 71 y.o. female with the above problem list.  She was last seen by [name] in 9/22.  She was seen in the emergency room 04/24/2021.  She presented to emergency room 04/24/2021 for evaluation of hypertension.  She had systolic blood pressures at home ranging 170s to 200s.  She was given IV hydralazine.  Her labs demonstrated a sodium of 130, K+ 4.7, ALT 13, creatinine 0.7 BNP 84.6, Hgb 16.2 high-sensitivity troponin 7.  EKG 04/24/2021: Sinus bradycardia, normal axis, nonspecific ST-T wave changes, LVH, QTc 445.  She returns for further evaluation and management of her hypertension.*** ?   ?Past Medical History:  ?Diagnosis Date  ? Anxiety   ? Arthritis   ? CAD (coronary artery disease)   ? a. s/p LCX stent 2010 with  residual disease.  ? Carotid artery disease ([place])   ? CHF (congestive heart failure) ([place])   ? COPD (chronic obstructive pulmonary disease) ([place])   ? Depression   ? GERD (gastroesophageal reflux disease)   ? Hepatitis C   ? History of carotid endarterectomy   ? Hypertension   ? Noncompliance with medication regimen   ? Polycythemia   ? Polycythemia vera ([place])   ? PVD (peripheral vascular disease) ([place])   ? Seizures ([place]) 1970  ? x1- pt. reports that there was never any causative agent    ? ?Current Medications: ?No outpatient medications have been marked as taking for the 04/29/21 encounter (Appointment) with Liliane Shi, PA-C.  ?  ?Allergies:   Diphenhydramine, Sulfa antibiotics, Nsaids, Sulfa antibiotics, Sulfur, and Bactrim [sulfamethoxazole-trimethoprim]  ? ?Social History  ? ?Tobacco Use  ? Smoking status: Every Day  ?  Packs/day: 1.00  ?  Types: Cigarettes  ?  Passive exposure: Never  ? Smokeless tobacco: Never  ?Vaping Use  ? Vaping Use: Some days  ? Start date: 08/07/2020  ?Substance Use Topics  ? Alcohol use: Not Currently  ?  Comment: socially occasionally  ? Drug use: Not Currently  ?  Comment: has smoked cigarettes off and on  ?  ?Family Hx: ?The patient's family history includes Heart disease in her father and mother. ? ?ROS  ? ?EKGs/Labs/Other Test Reviewed:   ? ?EKG:  EKG is *** ordered today.  The ekg ordered today  demonstrates *** ? ?Recent Labs: ?04/24/2021: ALT 13; B Natriuretic Peptide 84.6; BUN 11; Creatinine, Ser 0.70; Hemoglobin 16.2; Platelets 289; Potassium 4.7; Sodium 130  ? ?Recent Lipid Panel ?No results for input(s): CHOL, TRIG, HDL, VLDL, LDLCALC, LDLDIRECT in the last 8760 hours.  ? ?Risk Assessment/Calculations:   ?{Does this patient have ATRIAL FIBRILLATION?[phone]} ?    ?Physical Exam:   ? ?VS:  There were no vitals taken for this visit.   ? ?Wt Readings from Last 3 Encounters:  ?04/24/21 118 lb (53.5 kg)  ?04/18/21 118 lb (53.5 kg)  ?02/22/21 118 lb 6.4 oz (53.7 kg)  ?   ?Physical Exam *** ?    ?ASSESSMENT & PLAN:   ?No problem-specific Assessment & Plan notes found for this encounter. ?  ?     ?{Are you ordering a CV Procedure (e.g. stress test, cath, DCCV, TEE, etc)?   Press F2        :[id number]}  ?Dispo:  No follow-ups on file.  ? ?Medication Adjustments/Labs and Tests Ordered: ?Current medicines are reviewed at length with the patient today.  Concerns regarding medicines are outlined above.  ?Tests Ordered: ?No orders of the defined types were placed in this encounter. ? ?Medication Changes: ?No orders of the defined types were placed in this encounter. ? ?Signed, ?[name], PA-C  ?04/28/2021 11:24 PM    ?[facility] ?[address], [place], [place]  [address] ?Phone: [phone]; Fax: [phone]  ?

## 2021-04-29 ENCOUNTER — Other Ambulatory Visit: Payer: Self-pay

## 2021-04-29 ENCOUNTER — Inpatient Hospital Stay: Payer: HMO | Attending: Hematology & Oncology

## 2021-04-29 ENCOUNTER — Other Ambulatory Visit: Payer: Self-pay | Admitting: *Deleted

## 2021-04-29 ENCOUNTER — Ambulatory Visit: Payer: HMO | Admitting: Physician Assistant

## 2021-04-29 DIAGNOSIS — I25119 Atherosclerotic heart disease of native coronary artery with unspecified angina pectoris: Secondary | ICD-10-CM

## 2021-04-29 DIAGNOSIS — E785 Hyperlipidemia, unspecified: Secondary | ICD-10-CM

## 2021-04-29 DIAGNOSIS — I10 Essential (primary) hypertension: Secondary | ICD-10-CM

## 2021-04-29 DIAGNOSIS — D45 Polycythemia vera: Secondary | ICD-10-CM | POA: Diagnosis not present

## 2021-04-29 NOTE — Progress Notes (Signed)
[name] presents today for phlebotomy per MD orders. ?Phlebotomy procedure started at 1616 and ended at 1628. ?515 grams removed. ?Patient observed for 30 minutes after procedure without any incident. Food and drink offered. ?Patient tolerated procedure well. ?IV removed intact. ? ? ?

## 2021-04-29 NOTE — Patient Instructions (Signed)
Therapeutic Phlebotomy °Therapeutic phlebotomy is the planned removal of blood from a person's body for the purpose of treating a medical condition. The procedure is lot like donating blood. Usually, about a pint (470 mL, or 0.47 L) of blood is removed. The average adult has 9-12 pints (4.3-5.7 L) of blood in his or her body. °Therapeutic phlebotomy may be used to treat the following medical conditions: °Hemochromatosis. This is a condition in which the blood contains too much iron. °Polycythemia vera. This is a condition in which the blood contains too many red blood cells. °Porphyria cutanea tarda. This is a disease in which an important part of hemoglobin is not made properly. It results in the buildup of abnormal amounts of porphyrins in the body. °Sickle cell disease. This is a condition in which the red blood cells form an abnormal crescent shape rather than a round shape. °Tell a health care provider about: °Any allergies you have. °All medicines you are taking, including vitamins, herbs, eye drops, creams, and over-the-counter medicines. °Any bleeding problems you have. °Any surgeries you have had. °Any medical conditions you have. °Whether you are pregnant or may be pregnant. °What are the risks? °Generally, this is a safe procedure. However, problems may occur, including: °Nausea or light-headedness. °Low blood pressure (hypotension). °Soreness, bleeding, swelling, or bruising at the needle insertion site. °Infection. °What happens before the procedure? °Ask your health care provider about: °Changing or stopping your regular medicines. This is especially important if you are taking diabetes medicines or blood thinners. °Taking medicines such as aspirin and ibuprofen. These medicines can thin your blood. Do not take these medicines unless your health care provider tells you to take them. °Taking over-the-counter medicines, vitamins, herbs, and supplements. °Wear clothing with sleeves that can be raised  above the elbow. °You may have a blood sample taken. °Your blood pressure, pulse rate, and breathing rate will be measured. °What happens during the procedure? ° °You may be given a medicine to numb the area (local anesthetic). °A tourniquet will be placed on your arm. °A needle will be put into one of your veins. °Tubing and a collection bag will be attached to the needle. °Blood will flow through the needle and tubing into the collection bag. °The collection bag will be placed lower than your arm so gravity can help the blood flow into the bag. °You may be asked to open and close your hand slowly and continually during the entire collection. °After the specified amount of blood has been removed from your body, the collection bag and tubing will be clamped. °The needle will be removed from your vein. °Pressure will be held on the needle site to stop the bleeding. °A bandage (dressing) will be placed over the needle insertion site. °The procedure may vary among health care providers and hospitals. °What happens after the procedure? °Your blood pressure, pulse rate, and breathing rate will be measured after the procedure. °You will be encouraged to drink fluids. °You will be encouraged to eat a snack to prevent a low blood sugar level. °Your recovery will be assessed and monitored. °Return to your normal activities as told by your health care provider. °Summary °Therapeutic phlebotomy is the planned removal of blood from a person's body for the purpose of treating a medical condition. °Therapeutic phlebotomy may be used to treat hemochromatosis, polycythemia vera, porphyria cutanea tarda, or sickle cell disease. °In the procedure, a needle is inserted and about a pint (470 mL, or 0.47 L) of blood is   removed. The average adult has 9-12 pints (4.3-5.7 L) of blood in the body. °This is generally a safe procedure, but it can sometimes cause problems such as nausea, light-headedness, or low blood pressure  (hypotension). °This information is not intended to replace advice given to you by your health care provider. Make sure you discuss any questions you have with your health care provider. °Document Revised: 08/04/2020 Document Reviewed: 08/04/2020 °Elsevier Patient Education © 2022 [employer] Inc[employer] ° °

## 2021-04-30 IMAGING — CT CT NECK W/ CM
3 series · 8 of 14 positions shown, 9 images · IV contrast (iopamidol)
Comparison: None.

CLINICAL DATA: Right-sided enlarged lymph node.

EXAM:
CT NECK WITH CONTRAST
TECHNIQUE: Multidetector CT imaging of the neck was performed using the
standard protocol following the bolus administration of intravenous
contrast.
CONTRAST:  75mL P2ZZ9X-CDD IOPAMIDOL (P2ZZ9X-CDD) INJECTION 61%

[Series 3: neck · axial · 0.44mm/px · z∈[-206,-148]mm · 2 of 87 slices shown]
[im 29/87  bone]
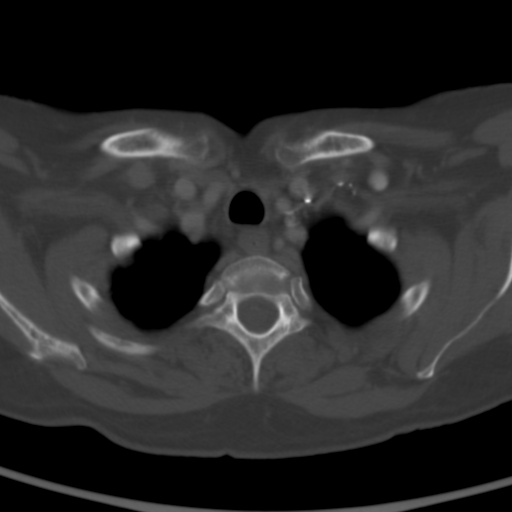
[im 58/87  bone]
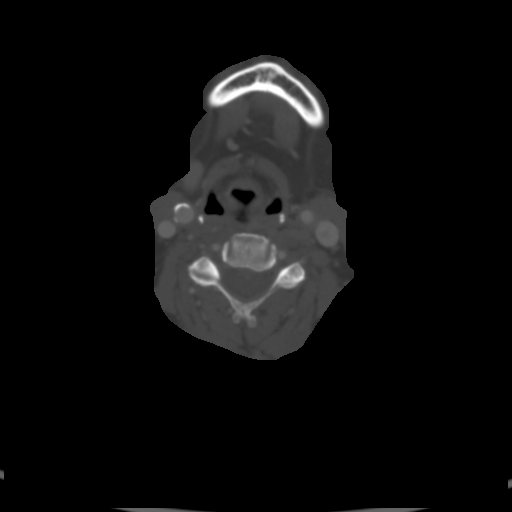

[Series 8: angled axial-oropharynx · axial · 0.36mm/px · z∈[-230,-146]mm · 3 of 86 slices shown, 4 images]
[im 22/86  soft-tissue]
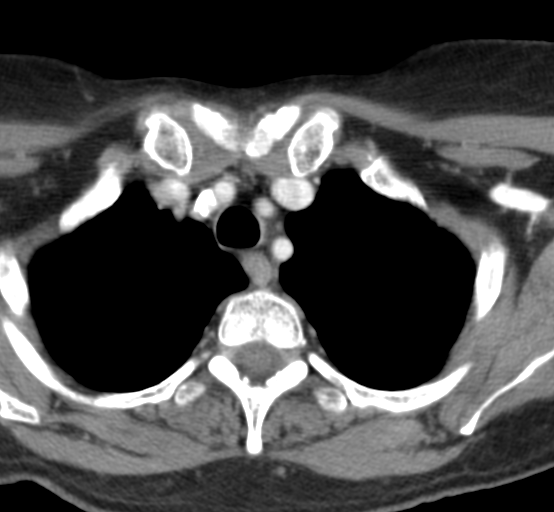
[im 22/86  bone]
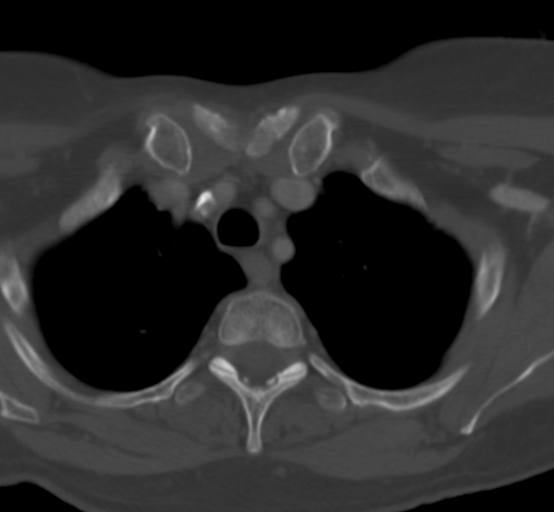
[im 43/86  bone]
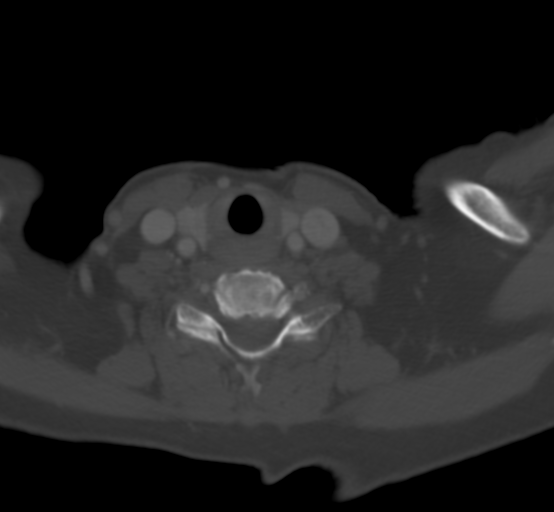
[im 64/86  bone]
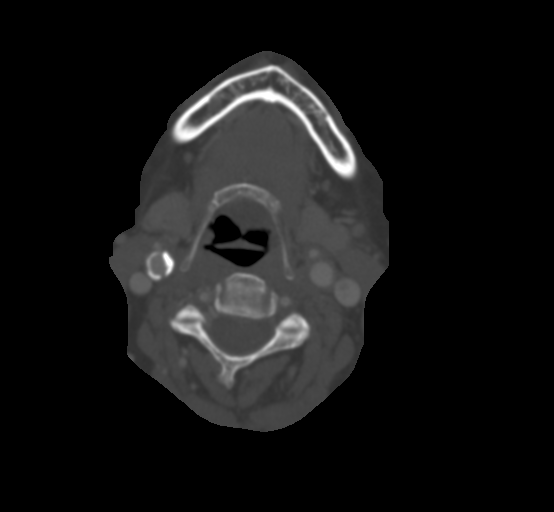

[Series 9: angled (person_name) · axial · 0.29mm/px · z∈[-209,-123]mm · 3 of 88 slices shown]
[im 22/88  bone]
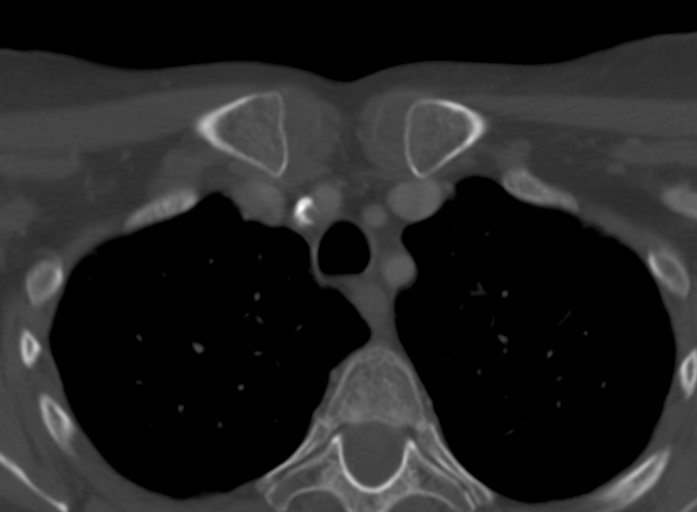
[im 44/88  bone]
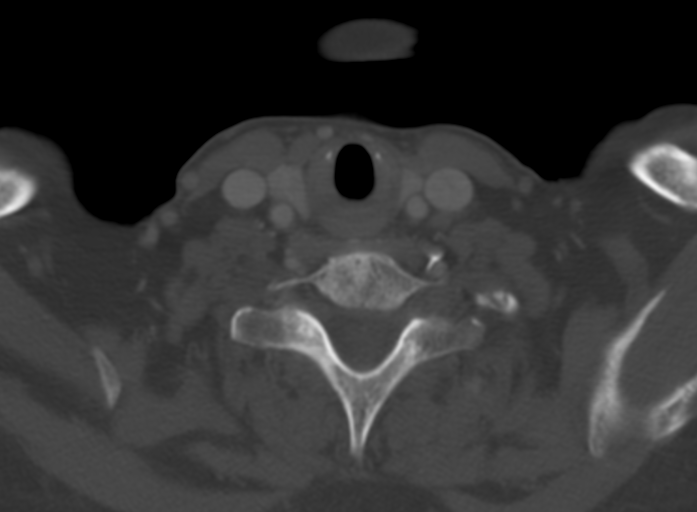
[im 66/88  bone]
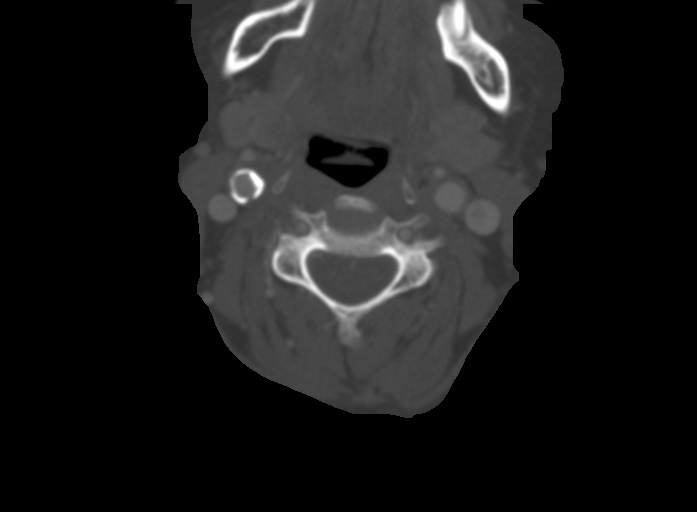

[8 of 14 positions shown; findings below may reference images not displayed]

FINDINGS: Pharynx and larynx: Normal. No mass or swelling within the
visualized portion, beginning at the lower aspect of the
nasopharynx.

Salivary glands: Visualized portions show no inflammation, mass, or
stone.

Thyroid: Normal.

Lymph nodes: None enlarged or abnormal density.

Vascular: Negative.

Limited intracranial: There subcentimeter bilateral lymph nodes. On
the right, the largest is at level 2A and measures 4 mm.

Visualized orbits: None

Mastoids and visualized paranasal sinuses: Lower maxillary sinuses
are clear.

Skeleton: No acute or aggressive process.

Upper chest: Negative.

Other: None.
IMPRESSION: No cervical lymphadenopathy. No finding to correspond to the
reported right-sided neck mass.

## 2021-05-20 DIAGNOSIS — J441 Chronic obstructive pulmonary disease with (acute) exacerbation: Secondary | ICD-10-CM | POA: Diagnosis not present

## 2021-06-14 ENCOUNTER — Inpatient Hospital Stay: Payer: HMO

## 2021-06-14 ENCOUNTER — Inpatient Hospital Stay: Payer: HMO | Admitting: Family

## 2021-06-28 ENCOUNTER — Inpatient Hospital Stay: Payer: HMO | Admitting: Family

## 2021-06-28 ENCOUNTER — Inpatient Hospital Stay: Payer: HMO

## 2021-07-06 ENCOUNTER — Inpatient Hospital Stay: Payer: HMO

## 2021-07-06 ENCOUNTER — Inpatient Hospital Stay: Payer: HMO | Admitting: Family

## 2021-08-03 ENCOUNTER — Inpatient Hospital Stay: Payer: HMO | Attending: Hematology & Oncology

## 2021-08-03 ENCOUNTER — Inpatient Hospital Stay: Payer: HMO | Admitting: Family

## 2021-08-03 ENCOUNTER — Encounter: Payer: Self-pay | Admitting: Family

## 2021-08-03 ENCOUNTER — Other Ambulatory Visit: Payer: Self-pay | Admitting: Oncology

## 2021-08-03 ENCOUNTER — Other Ambulatory Visit: Payer: Self-pay

## 2021-08-03 ENCOUNTER — Encounter: Payer: Self-pay | Admitting: *Deleted

## 2021-08-03 VITALS — BP 140/55 | HR 55 | Temp 98.0°F | Resp 18 | Ht 61.0 in | Wt 119.8 lb

## 2021-08-03 DIAGNOSIS — R0602 Shortness of breath: Secondary | ICD-10-CM | POA: Insufficient documentation

## 2021-08-03 DIAGNOSIS — D500 Iron deficiency anemia secondary to blood loss (chronic): Secondary | ICD-10-CM | POA: Diagnosis not present

## 2021-08-03 DIAGNOSIS — Z79899 Other long term (current) drug therapy: Secondary | ICD-10-CM | POA: Diagnosis not present

## 2021-08-03 DIAGNOSIS — B1920 Unspecified viral hepatitis C without hepatic coma: Secondary | ICD-10-CM | POA: Diagnosis not present

## 2021-08-03 DIAGNOSIS — D751 Secondary polycythemia: Secondary | ICD-10-CM

## 2021-08-03 DIAGNOSIS — D45 Polycythemia vera: Secondary | ICD-10-CM | POA: Diagnosis not present

## 2021-08-03 DIAGNOSIS — R002 Palpitations: Secondary | ICD-10-CM | POA: Diagnosis not present

## 2021-08-03 DIAGNOSIS — R202 Paresthesia of skin: Secondary | ICD-10-CM | POA: Diagnosis not present

## 2021-08-03 LAB — CBC WITH DIFFERENTIAL (CANCER CENTER ONLY)
Abs Immature Granulocytes: 0.02 10*3/uL (ref 0.00–0.07)
Basophils Absolute: 0 10*3/uL (ref 0.0–0.1)
Basophils Relative: 0 %
Eosinophils Absolute: 0.1 10*3/uL (ref 0.0–0.5)
Eosinophils Relative: 1 %
HCT: 41.3 % (ref 36.0–46.0)
Hemoglobin: 13.5 g/dL (ref 12.0–15.0)
Immature Granulocytes: 0 %
Lymphocytes Relative: 18 %
Lymphs Abs: 1.5 10*3/uL (ref 0.7–4.0)
MCH: 31.6 pg (ref 26.0–34.0)
MCHC: 32.7 g/dL (ref 30.0–36.0)
MCV: 96.7 fL (ref 80.0–100.0)
Monocytes Absolute: 0.9 10*3/uL (ref 0.1–1.0)
Monocytes Relative: 11 %
Neutro Abs: 5.7 10*3/uL (ref 1.7–7.7)
Neutrophils Relative %: 70 %
Platelet Count: 211 10*3/uL (ref 150–400)
RBC: 4.27 MIL/uL (ref 3.87–5.11)
RDW: 12.1 % (ref 11.5–15.5)
WBC Count: 8.1 10*3/uL (ref 4.0–10.5)
nRBC: 0 % (ref 0.0–0.2)

## 2021-08-03 LAB — CMP (CANCER CENTER ONLY)
ALT: 11 U/L (ref 0–44)
AST: 18 U/L (ref 15–41)
Albumin: 4.1 g/dL (ref 3.5–5.0)
Alkaline Phosphatase: 77 U/L (ref 38–126)
Anion gap: 6 (ref 5–15)
BUN: 16 mg/dL (ref 8–23)
CO2: 32 mmol/L (ref 22–32)
Calcium: 9 mg/dL (ref 8.9–10.3)
Chloride: 95 mmol/L — ABNORMAL LOW (ref 98–111)
Creatinine: 0.85 mg/dL (ref 0.44–1.00)
GFR, Estimated: >60 mL/min (ref >60)
Glucose, Bld: 128 mg/dL — ABNORMAL HIGH (ref 70–99)
Potassium: 4.1 mmol/L (ref 3.5–5.1)
Sodium: 133 mmol/L — ABNORMAL LOW (ref 135–145)
Total Bilirubin: 0.3 mg/dL (ref 0.3–1.2)
Total Protein: 6.1 g/dL — ABNORMAL LOW (ref 6.5–8.1)

## 2021-08-03 LAB — FERRITIN: Ferritin: 15 ng/mL (ref 11–307)

## 2021-08-03 NOTE — Progress Notes (Signed)
Hematology and Oncology Follow Up Visit  [name] [id number] 10/29/1950 71 y.o. 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 She is feeling fatigued and weak. She has also been having some issues with constipation.  She does not hydrate well throughout the day and is noted to have a low chloride level often. She only drinks coffee, tea and sodas.  She was encouraged to try substituting Gatorade for tea and sodas and adding more water for hydration throughout the day.  She notes SOB and palpitations at times.  She takes a break to rest as needed.  No fever, chills, n/v, cough, rash, dizziness, chest pain, abdominal pain or changes in bladder habits.  No swelling in her extremities.  She has intermittent tingling in her hands feet.  Appetite comes and goes. Weight is stable at 119 lbs.   ECOG Performance Status: 1 - Symptomatic but completely ambulatory  Medications:  Allergies as of 08/03/2021       Reactions   Diphenhydramine Hives   Sulfa Antibiotics Nausea And Vomiting   Sulfa Antibiotics Nausea Only   Sulfur Nausea Only   Bactrim [sulfamethoxazole-trimethoprim] Nausea And Vomiting   Nsaids Other (See Comments)   She doesn't take Nsaids because it runs up her BP.        Medication List        Accurate as of August 03, 2021  2:46 PM. If you have any questions, ask your nurse or doctor.          STOP taking these medications    Biotin 1000 MCG tablet Stopped by: Lottie Dawson, NP       TAKE these medications    acetaminophen 325 MG tablet Commonly known as: TYLENOL Take 325-650 mg by mouth every 6 (six) hours as needed for mild pain or headache.   ALPRAZolam  0.5 MG tablet Commonly known as: XANAX Take 0.5 mg by mouth at bedtime.   Breo Ellipta 100-25 MCG/ACT Aepb Generic drug: fluticasone furoate-vilanterol INHALE ONE PUFF ONCE A DAY   clopidogrel 75 MG tablet Commonly known as: PLAVIX Take 1 tablet by mouth once daily   clotrimazole-betamethasone cream Commonly known as: LOTRISONE Apply 1 application  topically 2 (two) times daily as needed (to affected area).   escitalopram 10 MG tablet Commonly known as: LEXAPRO Take 10 mg by mouth daily. Patient is taking 5 MG daily   hydrochlorothiazide 25 MG tablet Commonly known as: HYDRODIURIL Take 1 tablet by mouth once daily   lansoprazole 30 MG capsule Commonly known as: PREVACID Take 30 mg by mouth daily as needed (for ulcer/acid reflex). Ulcer/ acid reflux   metoprolol succinate 100 MG 24 hr tablet Commonly known as: TOPROL-XL TAKE 1 & 1/2 (ONE & ONE-HALF) TABLETS BY MOUTH ONCE DAILY . Please make yearly appt with [name] for May for future refills. 1st attempt   nitroGLYCERIN 0.4 MG SL tablet Commonly known as: NITROSTAT   rosuvastatin 10 MG tablet Commonly known as: CRESTOR Take 1 tablet (10 mg total) by mouth every other day.   telmisartan 20 MG tablet Commonly known as: MICARDIS Take 1 tablet by mouth once daily  VITAMIN C PO Take 2 tablets by mouth daily.   vitamin E 180 MG (400 UNITS) capsule Take 400 Units by mouth daily.        Allergies:  Allergies  Allergen Reactions   Diphenhydramine Hives   Sulfa Antibiotics Nausea And Vomiting   Sulfa Antibiotics Nausea Only   Sulfur Nausea Only   Bactrim [Sulfamethoxazole-Trimethoprim] Nausea And Vomiting   Nsaids Other (See Comments)    She doesn't take Nsaids because it runs up her BP.    Past Medical History, Surgical history, Social history, and Family History were reviewed and updated.  Review of Systems: All other 10 point review of systems is negative.   Physical Exam:  height is '5\' 1"'$  (1.549 m)  and weight is 119 lb 12.8 oz (54.3 kg). Her oral temperature is 98 F (36.7 C). Her blood pressure is 140/55 (abnormal) and her pulse is 55 (abnormal). Her respiration is 18 and oxygen saturation is 100%.   Wt Readings from Last 3 Encounters:  08/03/21 119 lb 12.8 oz (54.3 kg)  04/24/21 118 lb (53.5 kg)  04/18/21 118 lb (53.5 kg)    Ocular: Sclerae unicteric, pupils equal, round and reactive to light Ear-nose-throat: Oropharynx clear, dentition fair Lymphatic: No cervical or supraclavicular adenopathy Lungs no rales or rhonchi, good excursion bilaterally Heart regular rate and rhythm, no murmur appreciated Abd soft, nontender, positive bowel sounds MSK no focal spinal tenderness, no joint edema Neuro: non-focal, well-oriented, appropriate affect Breasts: Deferred   Lab Results  Component Value Date   WBC 8.1 08/03/2021   HGB 13.5 08/03/2021   HCT 41.3 08/03/2021   MCV 96.7 08/03/2021   PLT 211 08/03/2021   Lab Results  Component Value Date   FERRITIN 31 04/18/2021   IRON 126 04/18/2021   TIBC 448 04/18/2021   UIBC 322 04/18/2021   IRONPCTSAT 28 04/18/2021   Lab Results  Component Value Date   RETICCTPCT 1.2 08/03/2010   RBC 4.27 08/03/2021   RETICCTABS 61.3 08/03/2010   No results found for: "KPAFRELGTCHN", "LAMBDASER", "KAPLAMBRATIO" No results found for: "IGGSERUM", "IGA", "IGMSERUM" No results found for: "TOTALPROTELP", "ALBUMINELP", "A1GS", "A2GS", "BETS", "BETA2SER", "GAMS", "MSPIKE", "SPEI"   Chemistry      Component Value Date/Time   NA 130 (L) 04/24/2021 1143   NA 137 04/01/2019 1649   NA 139 01/03/2017 1327   NA 136 12/28/2015 1311   K 4.7 04/24/2021 1143   K 3.7 01/03/2017 1327   K 4.9 12/28/2015 1311   CL 94 (L) 04/24/2021 1143   CL 94 (L) 01/03/2017 1327   CO2 26 04/24/2021 1143   CO2 31 01/03/2017 1327   CO2 28 12/28/2015 1311   BUN 11 04/24/2021 1143   BUN 14 04/01/2019 1649   BUN 12 01/03/2017 1327   BUN 10.4 12/28/2015 1311   CREATININE  0.70 04/24/2021 1143   CREATININE 0.88 04/18/2021 1443   CREATININE 1.0 01/03/2017 1327   CREATININE 0.9 12/28/2015 1311      Component Value Date/Time   CALCIUM 9.2 04/24/2021 1143   CALCIUM 8.8 01/03/2017 1327   CALCIUM 9.6 12/28/2015 1311   ALKPHOS 63 04/24/2021 1143   ALKPHOS 85 (H) 01/03/2017 1327   ALKPHOS 83 12/28/2015 1311   AST 32 04/24/2021 1143   AST 20 04/18/2021 1443   AST 25 12/28/2015 1311   ALT 13 04/24/2021 1143   ALT 13 04/18/2021 1443   ALT 19 01/03/2017 1327   ALT 13 12/28/2015 1311   BILITOT 1.6 (H) 04/24/2021  1143   BILITOT 0.5 04/18/2021 1443   BILITOT 0.74 12/28/2015 1311       Impression and Plan: Ms. Aro is a 71 yo caucasian female with polycythemia.  No phlebotomy needed this visit, Hct 41.3%.  Iron studies pending.  Follow-up in 3 months.  Lottie Dawson, NP 6/14/20232:46 PM

## 2021-08-04 LAB — IRON AND IRON BINDING CAPACITY (CC-WL,HP ONLY)
Iron: 35 ug/dL (ref 28–170)
Saturation Ratios: 8 % — ABNORMAL LOW (ref 10.4–31.8)
TIBC: 437 ug/dL (ref 250–450)
UIBC: 402 ug/dL (ref 148–442)

## 2021-08-17 DIAGNOSIS — I779 Disorder of arteries and arterioles, unspecified: Secondary | ICD-10-CM | POA: Diagnosis not present

## 2021-08-17 DIAGNOSIS — J449 Chronic obstructive pulmonary disease, unspecified: Secondary | ICD-10-CM | POA: Diagnosis not present

## 2021-08-17 DIAGNOSIS — Z1331 Encounter for screening for depression: Secondary | ICD-10-CM | POA: Diagnosis not present

## 2021-08-17 DIAGNOSIS — Z0000 Encounter for general adult medical examination without abnormal findings: Secondary | ICD-10-CM | POA: Diagnosis not present

## 2021-08-17 DIAGNOSIS — I700 Atherosclerosis of aorta: Secondary | ICD-10-CM | POA: Diagnosis not present

## 2021-08-17 DIAGNOSIS — M8580 Other specified disorders of bone density and structure, unspecified site: Secondary | ICD-10-CM | POA: Diagnosis not present

## 2021-08-17 DIAGNOSIS — D45 Polycythemia vera: Secondary | ICD-10-CM | POA: Diagnosis not present

## 2021-08-17 DIAGNOSIS — E871 Hypo-osmolality and hyponatremia: Secondary | ICD-10-CM | POA: Diagnosis not present

## 2021-08-17 DIAGNOSIS — E782 Mixed hyperlipidemia: Secondary | ICD-10-CM | POA: Diagnosis not present

## 2021-08-17 DIAGNOSIS — I10 Essential (primary) hypertension: Secondary | ICD-10-CM | POA: Diagnosis not present

## 2021-08-17 DIAGNOSIS — I2510 Atherosclerotic heart disease of native coronary artery without angina pectoris: Secondary | ICD-10-CM | POA: Diagnosis not present

## 2021-08-17 DIAGNOSIS — F419 Anxiety disorder, unspecified: Secondary | ICD-10-CM | POA: Diagnosis not present

## 2021-08-17 DIAGNOSIS — R7303 Prediabetes: Secondary | ICD-10-CM | POA: Diagnosis not present

## 2021-08-17 DIAGNOSIS — Z23 Encounter for immunization: Secondary | ICD-10-CM | POA: Diagnosis not present

## 2021-08-17 DIAGNOSIS — B1920 Unspecified viral hepatitis C without hepatic coma: Secondary | ICD-10-CM | POA: Diagnosis not present

## 2021-09-27 ENCOUNTER — Other Ambulatory Visit: Payer: Self-pay | Admitting: Interventional Cardiology

## 2021-10-25 DIAGNOSIS — L821 Other seborrheic keratosis: Secondary | ICD-10-CM | POA: Diagnosis not present

## 2021-10-25 DIAGNOSIS — D2372 Other benign neoplasm of skin of left lower limb, including hip: Secondary | ICD-10-CM | POA: Diagnosis not present

## 2021-10-25 DIAGNOSIS — L570 Actinic keratosis: Secondary | ICD-10-CM | POA: Diagnosis not present

## 2021-10-25 DIAGNOSIS — Z1283 Encounter for screening for malignant neoplasm of skin: Secondary | ICD-10-CM | POA: Diagnosis not present

## 2021-10-25 DIAGNOSIS — X32XXXA Exposure to sunlight, initial encounter: Secondary | ICD-10-CM | POA: Diagnosis not present

## 2021-11-04 ENCOUNTER — Encounter: Payer: Self-pay | Admitting: Family

## 2021-11-04 ENCOUNTER — Inpatient Hospital Stay: Payer: HMO

## 2021-11-04 ENCOUNTER — Inpatient Hospital Stay: Payer: HMO | Attending: Hematology & Oncology

## 2021-11-04 ENCOUNTER — Inpatient Hospital Stay: Payer: HMO | Admitting: Family

## 2021-11-04 VITALS — BP 188/68 | HR 57 | Temp 97.8°F | Resp 17 | Ht 61.0 in | Wt 118.0 lb

## 2021-11-04 DIAGNOSIS — D45 Polycythemia vera: Secondary | ICD-10-CM | POA: Insufficient documentation

## 2021-11-04 DIAGNOSIS — Z8619 Personal history of other infectious and parasitic diseases: Secondary | ICD-10-CM | POA: Insufficient documentation

## 2021-11-04 DIAGNOSIS — D500 Iron deficiency anemia secondary to blood loss (chronic): Secondary | ICD-10-CM

## 2021-11-04 DIAGNOSIS — D751 Secondary polycythemia: Secondary | ICD-10-CM | POA: Diagnosis not present

## 2021-11-04 LAB — CMP (CANCER CENTER ONLY)
ALT: 13 U/L (ref 0–44)
AST: 22 U/L (ref 15–41)
Albumin: 4.1 g/dL (ref 3.5–5.0)
Alkaline Phosphatase: 67 U/L (ref 38–126)
Anion gap: 8 (ref 5–15)
BUN: 9 mg/dL (ref 8–23)
CO2: 31 mmol/L (ref 22–32)
Calcium: 9.6 mg/dL (ref 8.9–10.3)
Chloride: 90 mmol/L — ABNORMAL LOW (ref 98–111)
Creatinine: 0.71 mg/dL (ref 0.44–1.00)
GFR, Estimated: >60 mL/min (ref >60)
Glucose, Bld: 134 mg/dL — ABNORMAL HIGH (ref 70–99)
Potassium: 3.6 mmol/L (ref 3.5–5.1)
Sodium: 129 mmol/L — ABNORMAL LOW (ref 135–145)
Total Bilirubin: 0.5 mg/dL (ref 0.3–1.2)
Total Protein: 6.8 g/dL (ref 6.5–8.1)

## 2021-11-04 LAB — CBC WITH DIFFERENTIAL (CANCER CENTER ONLY)
Abs Immature Granulocytes: 0.02 10*3/uL (ref 0.00–0.07)
Basophils Absolute: 0 10*3/uL (ref 0.0–0.1)
Basophils Relative: 0 %
Eosinophils Absolute: 0.1 10*3/uL (ref 0.0–0.5)
Eosinophils Relative: 1 %
HCT: 39.7 % (ref 36.0–46.0)
Hemoglobin: 13.6 g/dL (ref 12.0–15.0)
Immature Granulocytes: 0 %
Lymphocytes Relative: 28 %
Lymphs Abs: 1.8 10*3/uL (ref 0.7–4.0)
MCH: 32 pg (ref 26.0–34.0)
MCHC: 34.3 g/dL (ref 30.0–36.0)
MCV: 93.4 fL (ref 80.0–100.0)
Monocytes Absolute: 0.6 10*3/uL (ref 0.1–1.0)
Monocytes Relative: 10 %
Neutro Abs: 3.9 10*3/uL (ref 1.7–7.7)
Neutrophils Relative %: 61 %
Platelet Count: 261 10*3/uL (ref 150–400)
RBC: 4.25 MIL/uL (ref 3.87–5.11)
RDW: 11.9 % (ref 11.5–15.5)
WBC Count: 6.4 10*3/uL (ref 4.0–10.5)
nRBC: 0 % (ref 0.0–0.2)

## 2021-11-04 LAB — FERRITIN: Ferritin: 35 ng/mL (ref 11–307)

## 2021-11-04 NOTE — Progress Notes (Signed)
Hematology and Oncology Follow Up Visit  [name] [id number] September 09, 1950 71 y.o. 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 Hepatitis C - clinical remission     Current Therapy:        Phlebotomy to maintain hematocrit less than 45%   *Prefers to go to [facility] and have phlebotomy done by [employer], RN   Interim History:  [name] is    ECOG Performance Status: 1 - Symptomatic but completely ambulatory  Medications:  Allergies as of 11/04/2021       Reactions   Diphenhydramine Hives   Sulfa Antibiotics Nausea And Vomiting   Sulfa Antibiotics Nausea Only   Sulfur Nausea Only   Bactrim [sulfamethoxazole-trimethoprim] Nausea And Vomiting   Nsaids Other (See Comments)   She doesn't take Nsaids because it runs up her BP.        Medication List        Accurate as of November 04, 2021  3:04 PM. If you have any questions, ask your nurse or doctor.          acetaminophen 325 MG tablet Commonly known as: TYLENOL Take 325-650 mg by mouth every 6 (six) hours as needed for mild pain or headache.   ALPRAZolam 0.5 MG tablet Commonly known as: XANAX Take 0.5 mg by mouth at bedtime.   Breo Ellipta 100-25 MCG/ACT Aepb Generic drug: fluticasone furoate-vilanterol INHALE ONE PUFF ONCE A DAY   clopidogrel 75 MG tablet Commonly known as: PLAVIX Take 1 tablet by mouth once daily   clotrimazole-betamethasone cream Commonly known as: LOTRISONE Apply 1 application  topically 2 (two) times daily as needed (to affected area).   escitalopram 10 MG tablet Commonly known as: LEXAPRO Take 10 mg by mouth daily. Patient is taking 5 MG daily   hydrochlorothiazide 25 MG tablet Commonly known as: HYDRODIURIL Take 1 tablet by mouth once daily   lansoprazole 30 MG capsule Commonly known as: PREVACID Take 30 mg by mouth daily as needed (for ulcer/acid reflex). Ulcer/ acid reflux   metoprolol succinate 100 MG 24 hr tablet Commonly known  as: TOPROL-XL TAKE 1 & 1/2 (ONE & ONE-HALF) TABLETS BY MOUTH ONCE DAILY . Please make yearly appt with [name] for May for future refills. 1st attempt   nitroGLYCERIN 0.4 MG SL tablet Commonly known as: NITROSTAT   rosuvastatin 10 MG tablet Commonly known as: CRESTOR Take 1 tablet (10 mg total) by mouth every other day.   telmisartan 20 MG tablet Commonly known as: MICARDIS Take 1 tablet by mouth once daily   VITAMIN C PO Take 2 tablets by mouth daily.   vitamin E 180 MG (400 UNITS) capsule Take 400 Units by mouth daily.        Allergies:  Allergies  Allergen Reactions   Diphenhydramine Hives   Sulfa Antibiotics Nausea And Vomiting   Sulfa Antibiotics Nausea Only   Sulfur Nausea Only   Bactrim [Sulfamethoxazole-Trimethoprim] Nausea And Vomiting   Nsaids Other (See Comments)    She doesn't take Nsaids because it runs up her BP.    Past Medical History, Surgical history, Social history, and Family History were reviewed and updated.  Review of Systems: All other 10 point review of systems is negative.   Physical Exam:  height is '5\' 1"'$  (1.549 m) and weight is 118 lb (53.5 kg). Her oral temperature is 97.8 F (36.6 C). Her blood pressure is 188/68 (abnormal) and her pulse is 57 (abnormal). Her respiration is 17 and oxygen  saturation is 98%.   Wt Readings from Last 3 Encounters:  11/04/21 118 lb (53.5 kg)  08/03/21 119 lb 12.8 oz (54.3 kg)  04/24/21 118 lb (53.5 kg)    Ocular: Sclerae unicteric, pupils equal, round and reactive to light Ear-nose-throat: Oropharynx clear, dentition fair Lymphatic: No cervical or supraclavicular adenopathy Lungs no rales or rhonchi, good excursion bilaterally Heart regular rate and rhythm, no murmur appreciated Abd soft, nontender, positive bowel sounds MSK no focal spinal tenderness, no joint edema Neuro: non-focal, well-oriented, appropriate affect Breasts: Deferred  Lab Results  Component Value Date   WBC 6.4 11/04/2021    HGB 13.6 11/04/2021   HCT 39.7 11/04/2021   MCV 93.4 11/04/2021   PLT 261 11/04/2021   Lab Results  Component Value Date   FERRITIN 15 08/03/2021   IRON 35 08/03/2021   TIBC 437 08/03/2021   UIBC 402 08/03/2021   IRONPCTSAT 8 (L) 08/03/2021   Lab Results  Component Value Date   RETICCTPCT 1.2 08/03/2010   RBC 4.25 11/04/2021   RETICCTABS 61.3 08/03/2010   No results found for: "KPAFRELGTCHN", "LAMBDASER", "KAPLAMBRATIO" No results found for: "IGGSERUM", "IGA", "IGMSERUM" No results found for: "TOTALPROTELP", "ALBUMINELP", "A1GS", "A2GS", "BETS", "BETA2SER", "GAMS", "MSPIKE", "SPEI"   Chemistry      Component Value Date/Time   NA 133 (L) 08/03/2021 1421   NA 137 04/01/2019 1649   NA 139 01/03/2017 1327   NA 136 12/28/2015 1311   K 4.1 08/03/2021 1421   K 3.7 01/03/2017 1327   K 4.9 12/28/2015 1311   CL 95 (L) 08/03/2021 1421   CL 94 (L) 01/03/2017 1327   CO2 32 08/03/2021 1421   CO2 31 01/03/2017 1327   CO2 28 12/28/2015 1311   BUN 16 08/03/2021 1421   BUN 14 04/01/2019 1649   BUN 12 01/03/2017 1327   BUN 10.4 12/28/2015 1311   CREATININE 0.85 08/03/2021 1421   CREATININE 1.0 01/03/2017 1327   CREATININE 0.9 12/28/2015 1311      Component Value Date/Time   CALCIUM 9.0 08/03/2021 1421   CALCIUM 8.8 01/03/2017 1327   CALCIUM 9.6 12/28/2015 1311   ALKPHOS 77 08/03/2021 1421   ALKPHOS 85 (H) 01/03/2017 1327   ALKPHOS 83 12/28/2015 1311   AST 18 08/03/2021 1421   AST 25 12/28/2015 1311   ALT 11 08/03/2021 1421   ALT 19 01/03/2017 1327   ALT 13 12/28/2015 1311   BILITOT 0.3 08/03/2021 1421   BILITOT 0.74 12/28/2015 1311       Impression and Plan: [name] is a 71 yo caucasian female with polycythemia.     Lottie Dawson, NP 9/15/20233:04 PM

## 2021-11-07 ENCOUNTER — Encounter: Payer: Self-pay | Admitting: Hematology & Oncology

## 2021-11-07 LAB — IRON AND IRON BINDING CAPACITY (CC-WL,HP ONLY)
Iron: 98 ug/dL (ref 28–170)
Saturation Ratios: 24 % (ref 10.4–31.8)
TIBC: 405 ug/dL (ref 250–450)
UIBC: 307 ug/dL (ref 148–442)

## 2021-11-11 ENCOUNTER — Telehealth: Payer: Self-pay | Admitting: *Deleted

## 2021-11-11 NOTE — Telephone Encounter (Signed)
Per 11/04/21 los - gave upcoming appointments - confirmed

## 2022-01-22 ENCOUNTER — Ambulatory Visit (INDEPENDENT_AMBULATORY_CARE_PROVIDER_SITE_OTHER): Payer: HMO

## 2022-01-22 ENCOUNTER — Ambulatory Visit
Admission: EM | Admit: 2022-01-22 | Discharge: 2022-01-22 | Disposition: A | Discharge status: Home / Self Care | Payer: HMO | Attending: Internal Medicine | Admitting: Internal Medicine

## 2022-01-22 DIAGNOSIS — W108XXA Fall (on) (from) other stairs and steps, initial encounter: Secondary | ICD-10-CM

## 2022-01-22 DIAGNOSIS — W19XXXA Unspecified fall, initial encounter: Secondary | ICD-10-CM

## 2022-01-22 DIAGNOSIS — M25572 Pain in left ankle and joints of left foot: Secondary | ICD-10-CM | POA: Diagnosis not present

## 2022-01-22 DIAGNOSIS — M79662 Pain in left lower leg: Secondary | ICD-10-CM

## 2022-01-22 DIAGNOSIS — M25562 Pain in left knee: Secondary | ICD-10-CM

## 2022-01-22 DIAGNOSIS — M79672 Pain in left foot: Secondary | ICD-10-CM | POA: Diagnosis not present

## 2022-01-22 DIAGNOSIS — M79605 Pain in left leg: Secondary | ICD-10-CM

## 2022-01-22 NOTE — ED Triage Notes (Signed)
Pt c/o fall down stairs that occurred ~ Friday. Now having whole left leg pain and skin abrasions.

## 2022-01-22 NOTE — Discharge Instructions (Signed)
X-rays are negative for any fracture or dislocation.  You have moderate amount of swelling due to your blood thinners.  Please monitor very closely for any increased bleeding, swelling, redness, purulent drainage and follow-up at the ER if this occurs.  Please make an appointment with your primary care doctor for further evaluation and management to ensure adequate healing as well.  Please keep wounds covered until healed over and change daily dressing daily and as needed if it becomes soiled.

## 2022-01-22 NOTE — ED Provider Notes (Signed)
[facility]    CSN: [id number] Arrival date & time: 01/22/22  0912      History   Chief Complaint Chief Complaint  Patient presents with   Fall         HPI [name] is a 71 y.o. female.   Patient presents for further evaluation of left lower leg pain that started approximately 3 days ago after a fall.  Patient reports that she was walking down a slippery steps on her porch carrying a plate when she slipped and fell.  She reports that her left lower leg twisted behind her.  She is having pain from the knee all the way down to the foot.  She is able to bear weight.  Patient does report that she takes blood thinning medications which is Plavix.  Denies hitting head or losing consciousness during fall. Denies any numbness or tingling.  Patient states that her last tetanus shot was within 5 years.   Fall    Past Medical History:  Diagnosis Date   Anxiety    Arthritis    CAD (coronary artery disease)    a. s/p LCX stent 2010 with residual disease.   Carotid artery disease (HCC)    CHF (congestive heart failure) (HCC)    COPD (chronic obstructive pulmonary disease) (HCC)    Depression    GERD (gastroesophageal reflux disease)    Hepatitis C    History of carotid endarterectomy    Hypertension    Noncompliance with medication regimen    Polycythemia    Polycythemia vera ([place])    PVD (peripheral vascular disease) ([place])    Seizures ([place]) 1970   x1- pt. reports that there was never any causative agent      Patient Active Problem List   Diagnosis Date Noted   History of syncope 04/26/2020   Pre-op evaluation 04/26/2020   Monteggia's fracture of right ulna, init for clos fx 05/02/2018   Radius/ulna fracture, right, closed, initial encounter 04/26/2018   CAD S/P percutaneous coronary angioplasty 04/26/2018   Polycythemia vera ([place]) 04/26/2018   COPD (chronic obstructive pulmonary disease) ([place]) 04/26/2018   Hyperlipidemia LDL goal <70  10/30/2017   Colitis 03/21/2017   Colitis with rectal bleeding 03/21/2017   Polycythemia vera ([place]) 03/21/2017   Carotid artery stenosis 07/24/2016   Chest pain 03/09/2015   Tobacco abuse 03/09/2015   Influenza 03/02/2012   COPD exacerbation ([place]) 03/02/2012   Hypoxia 03/02/2012   Hyponatremia 03/02/2012   Hematochezia 10/30/2011   Coronary artery disease involving native coronary artery of native heart with angina pectoris ([place])    GERD (gastroesophageal reflux disease)    Polycythemia    Irritable bowel syndrome 08/15/2010   HERPES ZOSTER 10/07/2009   LIPOMA 10/07/2009   HERPETIC GINGIVOSTOMATITIS 01/06/2009   UNSPECIFIED VITAMIN D DEFICIENCY 01/06/2009   ESOPHAGEAL REFLUX 06/04/2008   ANXIETY DEPRESSION 01/24/2007   OSTEOARTHRITIS, GENERALIZED, MULTIPLE JOINTS 11/21/2006   Acute hepatitis C virus infection 10/23/2006   Essential hypertension 10/23/2006    Past Surgical History:  Procedure Laterality Date   2 stints  Aug 26,2010   Dr. Tawnya Crook   ABDOMINAL SURGERY  [phone]   for excessive bleeding from loss of pregnancy, required blood transfusion post procedure    CAROTID ENDARTERECTOMY  2007   cartoid endartherectomy  2007   CORONARY ANGIOPLASTY WITH STENT PLACEMENT  2010   CORONARY ANGIOPLASTY WITH STENT PLACEMENT  10/09/2008   ENDARTERECTOMY Left 07/24/2016   Procedure: ENDARTERECTOMY CAROTID;  Surgeon: [name],  MD;  Location: [place];  Service: Vascular;  Laterality: Left;   FLEXIBLE SIGMOIDOSCOPY N/A 03/22/2017   Procedure: FLEXIBLE SIGMOIDOSCOPY;  Surgeon: Carol Ada, MD;  Location: [place];  Service: Endoscopy;  Laterality: N/A;   LYMPH NODE DISSECTION Left 1990's   benign    ORIF ELBOW FRACTURE Right 04/26/2018   Procedure: OPEN REDUCTION INTERNAL FIXATION (ORIF) ELBOW/OLECRANON FRACTURE;  Surgeon: Shona Needles, MD;  Location: [place];  Service: Orthopedics;  Laterality: Right;   TUBAL LIGATION      OB History   No obstetric history on file.       Home Medications    Prior to Admission medications   Medication Sig Start Date End Date Taking? Authorizing Provider  acetaminophen (TYLENOL) 325 MG tablet Take 325-650 mg by mouth every 6 (six) hours as needed for mild pain or headache.    [provider]  ALPRAZolam Duanne Moron) 0.5 MG tablet Take 0.5 mg by mouth at bedtime.  11/27/12   [provider]  Ascorbic Acid (VITAMIN C PO) Take 2 tablets by mouth daily.    [provider]  BREO ELLIPTA 100-25 MCG/INH AEPB INHALE ONE PUFF ONCE A DAY 03/22/18   [provider]  clopidogrel (PLAVIX) 75 MG tablet Take 1 tablet by mouth once daily 11/18/20   Belva Crome, MD  clotrimazole-betamethasone (LOTRISONE) cream Apply 1 application  topically 2 (two) times daily as needed (to affected area).    [provider]  escitalopram (LEXAPRO) 10 MG tablet Take 10 mg by mouth daily. Patient is taking 5 MG daily    [provider]  hydrochlorothiazide (HYDRODIURIL) 25 MG tablet Take 1 tablet by mouth once daily 09/28/21   Belva Crome, MD  lansoprazole (PREVACID) 30 MG capsule Take 30 mg by mouth daily as needed (for ulcer/acid reflex). Ulcer/ acid reflux    [provider]  metoprolol succinate (TOPROL-XL) 100 MG 24 hr tablet TAKE 1 & 1/2 (ONE & ONE-HALF) TABLETS BY MOUTH ONCE DAILY . Please make yearly appt with [name] for May for future refills. 1st attempt 03/14/19   Belva Crome, MD  nitroGLYCERIN (NITROSTAT) 0.4 MG SL tablet     [provider]  rosuvastatin (CRESTOR) 10 MG tablet Take 1 tablet (10 mg total) by mouth every other day. 04/01/19   Dunn, Nedra Hai, PA-C  telmisartan (MICARDIS) 20 MG tablet Take 1 tablet by mouth once daily 07/21/20   Belva Crome, MD  vitamin E 180 MG (400 UNITS) capsule Take 400 Units by mouth daily.    [provider]    Family History Family History  Problem Relation Age of Onset   Heart disease Mother    Heart disease Father      Social History Social History   Tobacco Use   Smoking status: Every Day    Packs/day: 1.00    Types: Cigarettes    Passive exposure: Never   Smokeless tobacco: Never  Vaping Use   Vaping Use: Some days   Start date: 08/07/2020  Substance Use Topics   Alcohol use: Not Currently    Comment: socially occasionally   Drug use: Not Currently    Comment: has smoked cigarettes off and on     Allergies   Diphenhydramine, Sulfa antibiotics, Sulfa antibiotics, Sulfur, Bactrim [sulfamethoxazole-trimethoprim], and Nsaids   Review of Systems Review of Systems Per HPI  Physical Exam Triage Vital Signs ED Triage Vitals [01/22/22 1006]  Enc Vitals Group  BP (!) 149/81     Pulse Rate 63     Resp 16     Temp 98.3 F (36.8 C)     Temp Source Oral     SpO2 97 %     Weight      Height      Head Circumference      Peak Flow      Pain Score 6     Pain Loc      Pain Edu?      Excl. in [place]?    No data found.  Updated Vital Signs BP (!) 149/81 (BP Location: Left Arm)   Pulse 63   Temp 98.3 F (36.8 C) (Oral)   Resp 16   SpO2 97%   Visual Acuity Right Eye Distance:   Left Eye Distance:   Bilateral Distance:    Right Eye Near:   Left Eye Near:    Bilateral Near:     Physical Exam Constitutional:      General: She is not in acute distress.    Appearance: Normal appearance. She is not toxic-appearing or diaphoretic.  HENT:     Head: Normocephalic and atraumatic.  Eyes:     Extraocular Movements: Extraocular movements intact.     Conjunctiva/sclera: Conjunctivae normal.  Pulmonary:     Effort: Pulmonary effort is normal.  Musculoskeletal:     Comments: Has tenderness to palpation throughout left lower extremity including anterior knee, anterior shin, circumferential around the ankle and dorsal surface of left foot at proximal foot overlying the left fourth and fifth metatarsals.  Capillary refill and pulses are intact.  Patient can wiggle toes and patient can  bear weight.  Skin:    Comments: Patient has various areas of bruising and discoloration with mild swelling present throughout anterior shin, anterior knee, circumferential around ankle, and lateral dorsal surface of left foot overlying proximal fourth and fifth metatarsals.  Patient has a skin tear that is irregularly shaped on mid anterior shin as well as a small skin tear that is approximately 0.5 inches in diameter present to mid anterior knee.  Bleeding is controlled.  Neurological:     General: No focal deficit present.     Mental Status: She is alert and oriented to person, place, and time. Mental status is at baseline.  Psychiatric:        Mood and Affect: Mood normal.        Behavior: Behavior normal.        Thought Content: Thought content normal.        Judgment: Judgment normal.      UC Treatments / Results  Labs (all labs ordered are listed, but only abnormal results are displayed) Labs Reviewed - No data to display  EKG   Radiology DG Foot Complete Left  Result Date: 01/22/2022 CLINICAL DATA:  [place] down stairs 2 days ago. Left foot injury and pain. EXAM: LEFT FOOT - COMPLETE 3+ VIEW COMPARISON:  None Available. FINDINGS: There is no evidence of fracture or dislocation. There is no evidence of arthropathy. Small dorsal calcaneal bone spur incidentally noted. IMPRESSION: No acute findings. Small dorsal calcaneal bone spur. Electronically Signed   By: Marlaine Hind M.D.   On: 01/22/2022 11:09   DG Ankle Complete Left  Result Date: 01/22/2022 CLINICAL DATA:  Fall down stairs 2 days ago. Left ankle injury and pain. EXAM: LEFT ANKLE COMPLETE - 3+ VIEW COMPARISON:  None Available. FINDINGS: There is no evidence of fracture, dislocation, or  joint effusion. There is no evidence of arthropathy or other focal bone abnormality. Soft tissues are unremarkable. IMPRESSION: Negative. Electronically Signed   By: Marlaine Hind M.D.   On: 01/22/2022 11:08   DG Tibia/Fibula Left  Result  Date: 01/22/2022 CLINICAL DATA:  Fall down stairs 2 days ago. Left leg injury and pain. EXAM: LEFT TIBIA AND FIBULA - 2 VIEW COMPARISON:  None Available. FINDINGS: There is no evidence of fracture or other focal bone lesions. Peripheral vascular calcification noted as well as scattered soft tissue phleboliths. IMPRESSION: No acute findings. Electronically Signed   By: Marlaine Hind M.D.   On: 01/22/2022 11:07   DG Knee Complete 4 Views Left  Result Date: 01/22/2022 CLINICAL DATA:  Fall down stairs EXAM: LEFT KNEE - COMPLETE 4+ VIEW COMPARISON:  None Available. FINDINGS: No evidence of fracture, dislocation, or joint effusion. No evidence of arthropathy or other focal bone abnormality. Soft tissues are unremarkable. IMPRESSION: Negative. Electronically Signed   By: Marlaine Hind M.D.   On: 01/22/2022 11:05    Procedures Procedures (including critical care time)  Medications Ordered in UC Medications - No data to display  Initial Impression / Assessment and Plan / UC Course  I have reviewed the triage vital signs and the nursing notes.  Pertinent labs & imaging results that were available during my care of the patient were reviewed by me and considered in my medical decision making (see chart for details).     Patient was originally advised to go to the ER for more advanced imaging and evaluation given that she takes blood thinning medications and has significant bruising to her left lower leg.  Patient refused going to the ER and risks associated with this were discussed with patient.  Patient voiced understanding and accepted risks.  She wishes to have limited evaluation here in urgent care.  X-ray was completed of left lower extremity that was negative for any acute bony abnormality throughout.  Low suspicion for any muscular tear but patient does have significant contusions along the left lower leg and most likely has muscular strains as well given mechanism of injury.  Advised Ace wrap to left  lower ankle which was applied prior to discharge.  Advised Ace wrap or knee brace to left knee as well but patient declined this in urgent care stating that she had this at home.  Also advised ice application and elevation of extremity to patient.  Patient advised to follow-up with her already established orthopedist if pain persists.  Also advised patient to follow-up with PCP as soon as possible to schedule an appointment to be evaluated to ensure adequate healing.  Patient has two abrasion/skin tears present to anterior shin and anterior knee of left leg.  Bleeding is controlled.  Wounds were cleaned and nonadherent dressings were applied.  Patient was advised of wound care and to change dressing daily and as needed if it becomes soiled.  Patient declined tetanus shot as she reports that she thinks that she has had it within 5 years.  Patient was advised to go to the ER immediately if any symptoms persist or worsen especially the bruising, bleeding, swelling.  She was also advised to monitor for signs of infection and to follow-up if these occur.  Patient verbalized understanding and was agreeable with plan. Final Clinical Impressions(s) / UC Diagnoses   Final diagnoses:  Fall, initial encounter  Left leg pain     Discharge Instructions      X-rays are negative for  any fracture or dislocation.  You have moderate amount of swelling due to your blood thinners.  Please monitor very closely for any increased bleeding, swelling, redness, purulent drainage and follow-up at the ER if this occurs.  Please make an appointment with your primary care doctor for further evaluation and management to ensure adequate healing as well.  Please keep wounds covered until healed over and change daily dressing daily and as needed if it becomes soiled.     ED Prescriptions   None    PDMP not reviewed this encounter.   Teodora Medici, [place] 01/22/22 1207

## 2022-01-29 ENCOUNTER — Other Ambulatory Visit: Payer: Self-pay | Admitting: Interventional Cardiology

## 2022-02-03 ENCOUNTER — Inpatient Hospital Stay: Payer: HMO | Admitting: Hematology & Oncology

## 2022-02-03 ENCOUNTER — Other Ambulatory Visit: Payer: Self-pay

## 2022-02-03 ENCOUNTER — Inpatient Hospital Stay: Payer: HMO | Attending: Hematology & Oncology

## 2022-02-03 ENCOUNTER — Encounter: Payer: Self-pay | Admitting: Hematology & Oncology

## 2022-02-03 VITALS — BP 171/65 | HR 57 | Temp 98.2°F | Resp 18 | Ht 61.0 in | Wt 118.1 lb

## 2022-02-03 DIAGNOSIS — B1920 Unspecified viral hepatitis C without hepatic coma: Secondary | ICD-10-CM | POA: Insufficient documentation

## 2022-02-03 DIAGNOSIS — D45 Polycythemia vera: Secondary | ICD-10-CM | POA: Diagnosis present

## 2022-02-03 DIAGNOSIS — D751 Secondary polycythemia: Secondary | ICD-10-CM

## 2022-02-03 DIAGNOSIS — D500 Iron deficiency anemia secondary to blood loss (chronic): Secondary | ICD-10-CM

## 2022-02-03 LAB — CMP (CANCER CENTER ONLY)
ALT: 11 U/L (ref 0–44)
AST: 21 U/L (ref 15–41)
Albumin: 4.4 g/dL (ref 3.5–5.0)
Alkaline Phosphatase: 67 U/L (ref 38–126)
Anion gap: 9 (ref 5–15)
BUN: 11 mg/dL (ref 8–23)
CO2: 31 mmol/L (ref 22–32)
Calcium: 9.1 mg/dL (ref 8.9–10.3)
Chloride: 94 mmol/L — ABNORMAL LOW (ref 98–111)
Creatinine: 0.73 mg/dL (ref 0.44–1.00)
GFR, Estimated: >60 mL/min (ref >60)
Glucose, Bld: 108 mg/dL — ABNORMAL HIGH (ref 70–99)
Potassium: 4 mmol/L (ref 3.5–5.1)
Sodium: 134 mmol/L — ABNORMAL LOW (ref 135–145)
Total Bilirubin: 0.7 mg/dL (ref 0.3–1.2)
Total Protein: 7 g/dL (ref 6.5–8.1)

## 2022-02-03 LAB — CBC WITH DIFFERENTIAL (CANCER CENTER ONLY)
Abs Immature Granulocytes: 0.02 10*3/uL (ref 0.00–0.07)
Basophils Absolute: 0 10*3/uL (ref 0.0–0.1)
Basophils Relative: 0 %
Eosinophils Absolute: 0.1 10*3/uL (ref 0.0–0.5)
Eosinophils Relative: 1 %
HCT: 40.7 % (ref 36.0–46.0)
Hemoglobin: 13.6 g/dL (ref 12.0–15.0)
Immature Granulocytes: 0 %
Lymphocytes Relative: 20 %
Lymphs Abs: 1.5 10*3/uL (ref 0.7–4.0)
MCH: 32.2 pg (ref 26.0–34.0)
MCHC: 33.4 g/dL (ref 30.0–36.0)
MCV: 96.2 fL (ref 80.0–100.0)
Monocytes Absolute: 0.7 10*3/uL (ref 0.1–1.0)
Monocytes Relative: 9 %
Neutro Abs: 5.4 10*3/uL (ref 1.7–7.7)
Neutrophils Relative %: 70 %
Platelet Count: 311 10*3/uL (ref 150–400)
RBC: 4.23 MIL/uL (ref 3.87–5.11)
RDW: 12 % (ref 11.5–15.5)
WBC Count: 7.7 10*3/uL (ref 4.0–10.5)
nRBC: 0 % (ref 0.0–0.2)

## 2022-02-03 LAB — IRON AND IRON BINDING CAPACITY (CC-WL,HP ONLY)
Iron: 65 ug/dL (ref 28–170)
Saturation Ratios: 15 % (ref 10.4–31.8)
TIBC: 438 ug/dL (ref 250–450)
UIBC: 373 ug/dL (ref 148–442)

## 2022-02-03 LAB — FERRITIN: Ferritin: 49 ng/mL (ref 11–307)

## 2022-02-03 NOTE — Progress Notes (Signed)
Hematology and Oncology Follow Up Visit  [name] [id number] 02-16-1951 71 y.o. 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  She is doing okay.  However, the real problem that she has is that she fell down some stairs.  Thankfully, she did not break anything.  However, she sustained a gash on the pretibial region of her left lower leg.  She currently is on some antibiotics for this.  She has this dressed.  There is a little bit of swelling in the left foot.  She says she has had x-rays which have not show any break.  There is also a lot of issues with her family.  She is trying to help her children who are having difficulties.  When we last saw her, her ferritin was 35 with an iron saturation of 24%.  She has had no fever.  She has had no cough.  She has had no nausea or vomiting.  There is been no change in bowel or bladder habits.  Currently, I would say performance status is probably ECOG 0.   Medications:  Allergies as of 02/03/2022       Reactions   Diphenhydramine Hives   Sulfa Antibiotics Nausea And Vomiting   Sulfa Antibiotics Nausea Only   Sulfur Nausea Only   Bactrim [sulfamethoxazole-trimethoprim] Nausea And Vomiting   Nsaids Other (See Comments)   She doesn't take Nsaids because it runs up her BP.        Medication List        Accurate as of February 03, 2022  1:29 PM. If you have any questions, ask your nurse or doctor.          acetaminophen 325 MG tablet Commonly known as: TYLENOL Take 325-650 mg by mouth every 6 (six) hours as needed for mild pain or headache.   ALPRAZolam 0.5 MG tablet Commonly known as: XANAX Take 0.5 mg by mouth at bedtime.   Breo Ellipta 100-25  MCG/ACT Aepb Generic drug: fluticasone furoate-vilanterol INHALE ONE PUFF ONCE A DAY   clopidogrel 75 MG tablet Commonly known as: PLAVIX Take 1 tablet by mouth once daily   clotrimazole-betamethasone cream Commonly known as: LOTRISONE Apply 1 application  topically 2 (two) times daily as needed (to affected area).   escitalopram 10 MG tablet Commonly known as: LEXAPRO Take 10 mg by mouth daily. Patient is taking 5 MG daily   hydrochlorothiazide 25 MG tablet Commonly known as: HYDRODIURIL Take 1 tablet by mouth once daily   lansoprazole 30 MG capsule Commonly known as: PREVACID Take 30 mg by mouth daily as needed (for ulcer/acid reflex). Ulcer/ acid reflux   metoprolol succinate 100 MG 24 hr tablet Commonly known as: TOPROL-XL TAKE 1 & 1/2 (ONE & ONE-HALF) TABLETS BY MOUTH ONCE DAILY . Please make yearly appt with [name] for May for future refills. 1st attempt   nitroGLYCERIN 0.4 MG SL tablet Commonly known as: NITROSTAT   rosuvastatin 10 MG tablet Commonly known as: CRESTOR Take 1 tablet (10 mg total) by mouth every other day.   telmisartan 20 MG tablet Commonly known as: MICARDIS Take 1 tablet by mouth once daily   VITAMIN C PO  Take 2 tablets by mouth daily.   vitamin E 180 MG (400 UNITS) capsule Take 400 Units by mouth daily.        Allergies:  Allergies  Allergen Reactions   Diphenhydramine Hives   Sulfa Antibiotics Nausea And Vomiting   Sulfa Antibiotics Nausea Only   Sulfur Nausea Only   Bactrim [Sulfamethoxazole-Trimethoprim] Nausea And Vomiting   Nsaids Other (See Comments)    She doesn't take Nsaids because it runs up her BP.    Past Medical History, Surgical history, Social history, and Family History were reviewed and updated.  Review of Systems: Review of Systems  Constitutional: Negative.   HENT: Negative.    Eyes: Negative.   Respiratory: Negative.    Cardiovascular: Negative.   Gastrointestinal: Negative.   Genitourinary:  Negative.   Musculoskeletal:  Positive for myalgias.  Skin: Negative.   Neurological: Negative.   Endo/Heme/Allergies: Negative.   Psychiatric/Behavioral: Negative.       Physical Exam:  height is '5\' 1"'$  (1.549 m) and weight is 118 lb 1.3 oz (53.6 kg). Her oral temperature is 98.2 F (36.8 C). Her blood pressure is 171/65 (abnormal) and her pulse is 57 (abnormal). Her respiration is 18.   Wt Readings from Last 3 Encounters:  02/03/22 118 lb 1.3 oz (53.6 kg)  11/04/21 118 lb (53.5 kg)  08/03/21 119 lb 12.8 oz (54.3 kg)    Physical Exam Vitals reviewed.  HENT:     Head: Normocephalic and atraumatic.  Eyes:     Pupils: Pupils are equal, round, and reactive to light.  Cardiovascular:     Rate and Rhythm: Normal rate and regular rhythm.     Heart sounds: Normal heart sounds.  Pulmonary:     Effort: Pulmonary effort is normal.     Breath sounds: Normal breath sounds.  Abdominal:     General: Bowel sounds are normal.     Palpations: Abdomen is soft.  Musculoskeletal:        General: No tenderness or deformity. Normal range of motion.     Cervical back: Normal range of motion.  Lymphadenopathy:     Cervical: No cervical adenopathy.  Skin:    General: Skin is warm and dry.     Findings: No erythema or rash.     Comments: In the pretibial area of her right lower leg, there is a sore.  This is a wound.  There is no exudate.  She has little bit of erythema around this.  She has little bit of swelling in the left ankle.  She has some slight decreased range of motion of the left foot.  She has decent pulses in the distal extremities.  Neurological:     Mental Status: She is alert and oriented to person, place, and time.  Psychiatric:        Behavior: Behavior normal.        Thought Content: Thought content normal.        Judgment: Judgment normal.      Lab Results  Component Value Date   WBC 7.7 02/03/2022   HGB 13.6 02/03/2022   HCT 40.7 02/03/2022   MCV 96.2 02/03/2022    PLT 311 02/03/2022   Lab Results  Component Value Date   FERRITIN 35 11/04/2021   IRON 98 11/04/2021   TIBC 405 11/04/2021   UIBC 307 11/04/2021   IRONPCTSAT 24 11/04/2021   Lab Results  Component Value Date   RETICCTPCT 1.2 08/03/2010   RBC 4.23 02/03/2022  RETICCTABS 61.3 08/03/2010   No results found for: "KPAFRELGTCHN", "LAMBDASER", "KAPLAMBRATIO" No results found for: "IGGSERUM", "IGA", "IGMSERUM" No results found for: "TOTALPROTELP", "ALBUMINELP", "A1GS", "A2GS", "BETS", "BETA2SER", "GAMS", "MSPIKE", "SPEI"   Chemistry      Component Value Date/Time   NA 129 (L) 11/04/2021 1443   NA 137 04/01/2019 1649   NA 139 01/03/2017 1327   NA 136 12/28/2015 1311   K 3.6 11/04/2021 1443   K 3.7 01/03/2017 1327   K 4.9 12/28/2015 1311   CL 90 (L) 11/04/2021 1443   CL 94 (L) 01/03/2017 1327   CO2 31 11/04/2021 1443   CO2 31 01/03/2017 1327   CO2 28 12/28/2015 1311   BUN 9 11/04/2021 1443   BUN 14 04/01/2019 1649   BUN 12 01/03/2017 1327   BUN 10.4 12/28/2015 1311   CREATININE 0.71 11/04/2021 1443   CREATININE 1.0 01/03/2017 1327   CREATININE 0.9 12/28/2015 1311      Component Value Date/Time   CALCIUM 9.6 11/04/2021 1443   CALCIUM 8.8 01/03/2017 1327   CALCIUM 9.6 12/28/2015 1311   ALKPHOS 67 11/04/2021 1443   ALKPHOS 85 (H) 01/03/2017 1327   ALKPHOS 83 12/28/2015 1311   AST 22 11/04/2021 1443   AST 25 12/28/2015 1311   ALT 13 11/04/2021 1443   ALT 19 01/03/2017 1327   ALT 13 12/28/2015 1311   BILITOT 0.5 11/04/2021 1443   BILITOT 0.74 12/28/2015 1311       Impression and Plan: Ms. Koval is a 71 yo caucasian female with polycythemia.  She is "triple negative".  Thankfully, she does not need to be phlebotomized.  I really think she is done well with this.  Ever since she was treated for her Hepatitis C, she has done quite nicely.  Her liver has been quite healthy.  I does feel bad that her family is having difficulties.  We will plan to get her back to  see [place] in another 3 months or so.   [name], MD 12/15/20231:29 PM

## 2022-03-02 ENCOUNTER — Other Ambulatory Visit: Payer: Self-pay | Admitting: Interventional Cardiology

## 2022-03-10 DIAGNOSIS — M1712 Unilateral primary osteoarthritis, left knee: Secondary | ICD-10-CM | POA: Diagnosis not present

## 2022-03-10 DIAGNOSIS — M25562 Pain in left knee: Secondary | ICD-10-CM | POA: Diagnosis not present

## 2022-04-20 ENCOUNTER — Telehealth: Payer: Self-pay | Admitting: Interventional Cardiology

## 2022-04-20 MED ORDER — CLOPIDOGREL BISULFATE 75 MG PO TABS: 75.0000 mg | ORAL_TABLET | Freq: Every day | ORAL | 0 refills | Status: DC

## 2022-04-20 MED ORDER — HYDROCHLOROTHIAZIDE 25 MG PO TABS: ORAL_TABLET | ORAL | 0 refills | Status: DC

## 2022-04-20 NOTE — Telephone Encounter (Signed)
Pt's medications were sent to pt's pharmacy as requested. Confirmation received.  

## 2022-04-20 NOTE — Telephone Encounter (Signed)
*  STAT* If patient is at the pharmacy, call can be transferred to refill team.   1. Which medications need to be refilled? (please list name of each medication and dose if known)  clopidogrel (PLAVIX) 75 MG tablet  hydrochlorothiazide (HYDRODIURIL) 25 MG tablet    2. Which pharmacy/location (including street and city if local pharmacy) is medication to be sent to?  [place], [address]    3. Do they need a 30 day or 90 day supply? 30 day

## 2022-04-23 NOTE — Progress Notes (Signed)
Cardiology Office Note   Date:  04/24/2022   ID:  [name], DOB 09/20/1950, MRN [id number]  PCP:  Georgann Housekeeper, MD    No chief complaint on file.  CAD  Wt Readings from Last 3 Encounters:  04/24/22 114 lb 12.8 oz (52.1 kg)  02/03/22 118 lb 1.3 oz (53.6 kg)  11/04/21 118 lb (53.5 kg)       History of Present Illness: [name] is a 72 y.o. female  former patient of Dr. Katrinka Blazing  with a hx of  hypertension, CAD s/p stent 2010 (legs were very heavy), left carotid endarterectomy 07/2016, right carotid endarterectomy 2007, polycythemia vera, tobacco abuse, COPD, GERD and anxiety.  Polycythemia vera and hepatitis C followed by Dr. Myna Hidalgo, hematology with intermittent phlebotomy, and recent concern about CHF.  Her mother was my patient and died at 30.     As of Nov 27, 2020: "There was a question of whether she had heart failure.  Her LV function is normal.  She does not have ischemia on nuclear testing.  Last LVEF was greater than 60% but she did have grade 2 diastolic dysfunction. She stopped smoking in June.  Hopefully she will continue."  She does not check BP at home because it scares her.  She does not take her meds on a regular schedule.  BP has always been high.     Past Medical History:  Diagnosis Date   Anxiety    Arthritis    CAD (coronary artery disease)    a. s/p LCX stent 2010 with residual disease.   Carotid artery disease (HCC)    CHF (congestive heart failure) (HCC)    COPD (chronic obstructive pulmonary disease) (HCC)    Depression    GERD (gastroesophageal reflux disease)    Hepatitis C    History of carotid endarterectomy    Hypertension    Noncompliance with medication regimen    Polycythemia    Polycythemia vera (HCC)    PVD (peripheral vascular disease) (HCC)    Seizures (HCC) 1970   x1- pt. reports that there was never any causative agent      Past Surgical History:  Procedure Laterality Date   2 stints  Aug 26,2010    Dr. Rudean Haskell   ABDOMINAL SURGERY  [phone]   for excessive bleeding from loss of pregnancy, required blood transfusion post procedure    CAROTID ENDARTERECTOMY  2007   cartoid endartherectomy  2007   CORONARY ANGIOPLASTY WITH STENT PLACEMENT  2010   CORONARY ANGIOPLASTY WITH STENT PLACEMENT  10/09/2008   ENDARTERECTOMY Left 07/24/2016   Procedure: ENDARTERECTOMY CAROTID;  Surgeon: Larina Earthly, MD;  Location: [facility] OR;  Service: Vascular;  Laterality: Left;   FLEXIBLE SIGMOIDOSCOPY N/A 03/22/2017   Procedure: Arnell Sieving;  Surgeon: Jeani Hawking, MD;  Location: [facility] ENDOSCOPY;  Service: Endoscopy;  Laterality: N/A;   LYMPH NODE DISSECTION Left 1990's   benign    ORIF ELBOW FRACTURE Right 04/26/2018   Procedure: OPEN REDUCTION INTERNAL FIXATION (ORIF) ELBOW/OLECRANON FRACTURE;  Surgeon: Roby Lofts, MD;  Location: [facility] OR;  Service: Orthopedics;  Laterality: Right;   TUBAL LIGATION       Current Outpatient Medications  Medication Sig Dispense Refill   acetaminophen (TYLENOL) 325 MG tablet Take 325-650 mg by mouth every 6 (six) hours as needed for mild pain or headache.     albuterol (VENTOLIN HFA) 108 (90 Base) MCG/ACT inhaler Inhale into the lungs.     ALPRAZolam (XANAX) 0.5 MG  tablet Take 0.5 mg by mouth at bedtime.      Ascorbic Acid (VITAMIN C PO) Take 2 tablets by mouth daily.     Ascorbic Acid 500 MG CAPS Take by mouth.     BREO ELLIPTA 100-25 MCG/INH AEPB INHALE ONE PUFF ONCE A DAY     clotrimazole-betamethasone (LOTRISONE) cream Apply 1 application  topically 2 (two) times daily as needed (to affected area).     lansoprazole (PREVACID) 30 MG capsule Take 30 mg by mouth daily as needed (for ulcer/acid reflex). Ulcer/ acid reflux     metoprolol succinate (TOPROL-XL) 100 MG 24 hr tablet TAKE 1 & 1/2 (ONE & ONE-HALF) TABLETS BY MOUTH ONCE DAILY . Please make yearly appt with Dr. Katrinka Blazing for May for future refills. 1st attempt 135 tablet 1   nitroGLYCERIN (NITROSTAT) 0.4 MG SL tablet       rosuvastatin (CRESTOR) 10 MG tablet Take 1 tablet (10 mg total) by mouth every other day. 90 tablet 3   clopidogrel (PLAVIX) 75 MG tablet Take 1 tablet (75 mg total) by mouth daily. 90 tablet 3   escitalopram (LEXAPRO) 10 MG tablet Take 10 mg by mouth daily. Patient is taking 5 MG daily (Patient not taking: Reported on 04/24/2022)     hydrochlorothiazide (HYDRODIURIL) 25 MG tablet TAKE 1 TABLET BY MOUTH ONCE DAILY. 90 tablet 3   telmisartan (MICARDIS) 20 MG tablet Take 1 tablet by mouth once daily (Patient not taking: Reported on 04/24/2022) 30 tablet 0   vitamin E 180 MG (400 UNITS) capsule Take 400 Units by mouth daily. (Patient not taking: Reported on 04/24/2022)     No current facility-administered medications for this visit.    Allergies:   Diphenhydramine, Sulfa antibiotics, Sulfa antibiotics, Sulfur, Bactrim [sulfamethoxazole-trimethoprim], Nsaids, and Sulfamethoxazole-trimethoprim    Social History:  The patient  reports that she has been smoking cigarettes. She has been smoking an average of 1 pack per day. She has never been exposed to tobacco smoke. She has never used smokeless tobacco. She reports that she does not currently use alcohol. She reports that she does not currently use drugs.   Family History:  The patient's family history includes Heart disease in her father and mother.    ROS:  Please see the history of present illness.   Otherwise, review of systems are positive for .   All other systems are reviewed and negative.    PHYSICAL EXAM: VS:  BP (!) 200/92   Pulse 63   Ht 5' 0.5" (1.537 m)   Wt 114 lb 12.8 oz (52.1 kg)   SpO2 96%   BMI 22.05 kg/m  , BMI Body mass index is 22.05 kg/m. GEN: Well nourished, well developed, in no acute distress HEENT: normal Neck: no JVD, carotid bruits, or masses Cardiac: RRR; no murmurs, rubs, or gallops,no edema  Respiratory:  clear to auscultation bilaterally, normal work of breathing GI: soft, nontender, nondistended, + BS MS:  no deformity or atrophy Skin: warm and dry, no rash Neuro:  Strength and sensation are intact Psych: euthymic mood, full affect   EKG:   The ekg ordered today demonstrates sinus rhythm, nonspecific ST-T wave changes   Recent Labs: 02/03/2022: ALT 11; BUN 11; Creatinine 0.73; Hemoglobin 13.6; Platelet Count 311; Potassium 4.0; Sodium 134   Lipid Panel    Component Value Date/Time   CHOL 142 12/04/2017 1203   TRIG 95 12/04/2017 1203   HDL 62 12/04/2017 1203   CHOLHDL 2.3 12/04/2017 1203   CHOLHDL  5.1 (H) 09/16/2014 1322   VLDL 37 (H) 09/16/2014 1322   LDLCALC 61 12/04/2017 1203     Other studies Reviewed: Additional studies/ records that were reviewed today with results demonstrating: LDL 77, HDL 61, TG 114, TC 159 in June 2023.   ASSESSMENT AND PLAN:  CAD: Has been on Rosuvastatin 10 mg daily.  Normal stress test in 07/2020.  Whole food, plant-based diet.  High-fiber diet.  Avoid processed foods. Now on Plavix.   COPD: Longtime smoker.  Has stopped smoking over the past month.  Advised to stop vaping as well. PAD: COntinue plavix and statin.  S/p CEA.  Dopplers stable recently. Former smoker: Quit in June 2023.  Restarted but stopped again Jan 28,2024.  Vaping now and was advised to stop.  HTN: Low-salt diet recommended.  She has not been exercising.  She has not taken telmisartan in quite some time.  Consider adding amlodipine if BP readings stay high after telmisartan is added back.  Needs to take telmisartan on a regular schedule as prescribed along with the Toprol and the HCTZ.  OK to take together Feels no motivation to do anything including exercise or take her meds.  Feels depressed- Prozac was started by PMD.  I encouraged her to be more compliant with her medications.  Blood pressure control will be important to help prevent a stroke.    Current medicines are reviewed at length with the patient today.  The patient concerns regarding her medicines were addressed.  The  following changes have been made:  No change  Labs/ tests ordered today include:   Orders Placed This Encounter  Procedures   AMB Referral to [facility] Pharm-D   EKG 12-Lead    Recommend 150 minutes/week of aerobic exercise Low fat, low carb, high fiber diet recommended  Disposition:   FU in HTN clinic   Signed, Lance Muss, MD  04/24/2022 2:14 PM    [facility] [address] [place], [place], [place]  [address] Phone: [phone]; Fax: [phone]

## 2022-04-24 ENCOUNTER — Encounter: Payer: Self-pay | Admitting: Interventional Cardiology

## 2022-04-24 ENCOUNTER — Ambulatory Visit: Payer: PPO | Attending: Interventional Cardiology | Admitting: Interventional Cardiology

## 2022-04-24 ENCOUNTER — Encounter: Payer: Self-pay | Admitting: Hematology & Oncology

## 2022-04-24 VITALS — BP 184/80 | HR 63 | Ht 60.5 in | Wt 114.8 lb

## 2022-04-24 DIAGNOSIS — I25119 Atherosclerotic heart disease of native coronary artery with unspecified angina pectoris: Secondary | ICD-10-CM

## 2022-04-24 DIAGNOSIS — Z87891 Personal history of nicotine dependence: Secondary | ICD-10-CM | POA: Diagnosis not present

## 2022-04-24 DIAGNOSIS — Z9889 Other specified postprocedural states: Secondary | ICD-10-CM | POA: Diagnosis not present

## 2022-04-24 DIAGNOSIS — J449 Chronic obstructive pulmonary disease, unspecified: Secondary | ICD-10-CM | POA: Diagnosis not present

## 2022-04-24 DIAGNOSIS — I10 Essential (primary) hypertension: Secondary | ICD-10-CM

## 2022-04-24 MED ORDER — HYDROCHLOROTHIAZIDE 25 MG PO TABS: ORAL_TABLET | ORAL | 3 refills | Status: DC

## 2022-04-24 MED ORDER — CLOPIDOGREL BISULFATE 75 MG PO TABS: 75.0000 mg | ORAL_TABLET | Freq: Every day | ORAL | 3 refills | Status: DC

## 2022-04-24 NOTE — Patient Instructions (Addendum)
Medication Instructions:  Your physician recommends that you continue on your current medications as directed. Please refer to the Current Medication list given to you today.  *If you need a refill on your cardiac medications before your next appointment, please call your pharmacy*   Lab Work: none If you have labs (blood work) drawn today and your tests are completely normal, you will receive your results only by: [place] (if you have MyChart) OR A paper copy in the mail If you have any lab test that is abnormal or we need to change your treatment, we will call you to review the results.   Testing/Procedures: none   Follow-Up: At [facility], you and your health needs are our priority.  As part of our continuing mission to provide you with exceptional heart care, we have created designated Provider Care Teams.  These Care Teams include your primary Cardiologist (physician) and Advanced Practice Providers (APPs -  Physician Assistants and Nurse Practitioners) who all work together to provide you with the care you need, when you need it.  We recommend signing up for the patient portal called "MyChart".  Sign up information is provided on this After Visit Summary.  MyChart is used to connect with patients for Virtual Visits (Telemedicine).  Patients are able to view lab/test results, encounter notes, upcoming appointments, etc.  Non-urgent messages can be sent to your provider as well.   To learn more about what you can do with MyChart, go to [email or web address].    Your next appointment:   12 month(s)  Provider:   Larae Grooms, MD     Other Instructions You have been referred to the hypertension clinic in our office.  Please schedule appointment for early-mid April Check blood pressure at home and keep record of readings.  Bring readings and BP cuff to appointment with pharmacist.  You can also send readings in through my chart or call to office if needed.

## 2022-05-05 ENCOUNTER — Inpatient Hospital Stay: Payer: PPO

## 2022-05-05 ENCOUNTER — Inpatient Hospital Stay: Payer: PPO | Admitting: Family

## 2022-05-22 ENCOUNTER — Other Ambulatory Visit: Payer: Self-pay

## 2022-05-22 MED ORDER — TELMISARTAN 20 MG PO TABS: 20.0000 mg | ORAL_TABLET | Freq: Every day | ORAL | 3 refills | Status: DC

## 2022-06-01 ENCOUNTER — Ambulatory Visit: Payer: PPO

## 2022-06-06 ENCOUNTER — Ambulatory Visit: Payer: PPO | Attending: Interventional Cardiology

## 2022-06-06 NOTE — Progress Notes (Deleted)
Patient ID: [name]                 DOB: 05-02-1950                      MRN: [id number]      HPI: [name] is a 72 y.o. female referred by Dr. Eldridge Dace to HTN clinic. PMH is significant for  hypertension, CAD s/p stent 2010 (legs were very heavy), left carotid endarterectomy 07/2016, right carotid endarterectomy 2007, polycythemia vera, Hepatitis C, tobacco abuse, COPD, GERD and anxiety. At visit with Dr. Eldridge Dace at the beginning of March, patient's blood pressure was very elevated. She was not taking her telmisartan. Not taking medications on a consistent basis. Not checking her blood pressure at home. Was struggling with depression and had no motivation to exercise. Patient was asked to resume her telmisartan.   Smoking? Vaping? BMP Compliance? Dizziness, lightheadedness, headache, blurred vision, SOB, swelling     Current HTN meds: telmisartan  daily, metoprolol succinate  daily, HCTZ  daily Previously tried:  BP goal: <130/80  Family History:  The patient's family history includes Heart disease in her father and mother.   Social History:   Diet:   Exercise:  {types:28256}  Home BP readings:  Date SBP/DBP  HR                              Average      Wt Readings from Last 3 Encounters:  04/24/22 114 lb 12.8 oz (52.1 kg)  02/03/22 118 lb 1.3 oz (53.6 kg)  11/04/21 118 lb (53.5 kg)   BP Readings from Last 3 Encounters:  04/24/22 (!) 184/80  02/03/22 (!) 171/65  01/22/22 (!) 149/81   Pulse Readings from Last 3 Encounters:  04/24/22 63  02/03/22 (!) 57  01/22/22 63    Renal function: CrCl cannot be calculated (Patient's most recent lab result is older than the maximum 21 days allowed.).  Past Medical History:  Diagnosis Date   Anxiety    Arthritis    CAD (coronary artery disease)    a. s/p LCX stent 2010 with residual disease.   Carotid artery disease (HCC)    CHF (congestive heart failure) (HCC)     COPD (chronic obstructive pulmonary disease) (HCC)    Depression    GERD (gastroesophageal reflux disease)    Hepatitis C    History of carotid endarterectomy    Hypertension    Noncompliance with medication regimen    Polycythemia    Polycythemia vera (HCC)    PVD (peripheral vascular disease) (HCC)    Seizures (HCC) 1970   x1- pt. reports that there was never any causative agent      Current Outpatient Medications on File Prior to Visit  Medication Sig Dispense Refill   acetaminophen (TYLENOL) 325 MG tablet Take 325-650 mg by mouth every 6 (six) hours as needed for mild pain or headache.     albuterol (VENTOLIN HFA) 108 (90 Base) MCG/ACT inhaler Inhale into the lungs.     ALPRAZolam (XANAX) 0.5 MG tablet Take 0.5 mg by mouth at bedtime.      Ascorbic Acid (VITAMIN C PO) Take 2 tablets by mouth daily.     Ascorbic Acid 500 MG CAPS Take by mouth.     BREO ELLIPTA 100-25 MCG/INH AEPB INHALE ONE PUFF ONCE A DAY     clopidogrel (PLAVIX) 75 MG tablet Take  1 tablet (75 mg total) by mouth daily. 90 tablet 3   clotrimazole-betamethasone (LOTRISONE) cream Apply 1 application  topically 2 (two) times daily as needed (to affected area).     escitalopram (LEXAPRO) 10 MG tablet Take 10 mg by mouth daily. Patient is taking 5 MG daily (Patient not taking: Reported on 04/24/2022)     hydrochlorothiazide (HYDRODIURIL) 25 MG tablet TAKE 1 TABLET BY MOUTH ONCE DAILY. 90 tablet 3   lansoprazole (PREVACID) 30 MG capsule Take 30 mg by mouth daily as needed (for ulcer/acid reflex). Ulcer/ acid reflux     metoprolol succinate (TOPROL-XL) 100 MG 24 hr tablet TAKE 1 & 1/2 (ONE & ONE-HALF) TABLETS BY MOUTH ONCE DAILY . Please make yearly appt with Dr. Katrinka Blazing for May for future refills. 1st attempt 135 tablet 1   nitroGLYCERIN (NITROSTAT) 0.4 MG SL tablet      rosuvastatin (CRESTOR) 10 MG tablet Take 1 tablet (10 mg total) by mouth every other day. 90 tablet 3   telmisartan (MICARDIS) 20 MG tablet Take 1 tablet (20  mg total) by mouth daily. 90 tablet 3   vitamin E 180 MG (400 UNITS) capsule Take 400 Units by mouth daily. (Patient not taking: Reported on 04/24/2022)     No current facility-administered medications on file prior to visit.    Allergies  Allergen Reactions   Diphenhydramine Hives   Sulfa Antibiotics Nausea And Vomiting   Sulfa Antibiotics Nausea Only   Sulfur Nausea Only   Bactrim [Sulfamethoxazole-Trimethoprim] Nausea And Vomiting   Nsaids Other (See Comments)    She doesn't take Nsaids because it runs up her BP.   Sulfamethoxazole-Trimethoprim Nausea And Vomiting    Other reaction(s): Nausea And Vomiting  Other Reaction(s): GI Intolerance    There were no vitals taken for this visit.   Assessment/Plan:  1. Hypertension -  No problem-specific Assessment & Plan notes found for this encounter.      Thank you  Olene Floss, Pharm.D, BCPS, CPP [facility] A Division of [facility] [address] [place], [place] [address]  Phone: [phone]; Fax: [phone]

## 2022-06-27 ENCOUNTER — Inpatient Hospital Stay: Payer: PPO | Attending: Hematology & Oncology

## 2022-06-27 ENCOUNTER — Inpatient Hospital Stay (HOSPITAL_BASED_OUTPATIENT_CLINIC_OR_DEPARTMENT_OTHER): Payer: PPO | Admitting: Medical Oncology

## 2022-06-27 ENCOUNTER — Encounter: Payer: Self-pay | Admitting: Medical Oncology

## 2022-06-27 VITALS — BP 182/77 | HR 55 | Temp 98.2°F | Resp 17 | Wt 114.0 lb

## 2022-06-27 DIAGNOSIS — D751 Secondary polycythemia: Secondary | ICD-10-CM

## 2022-06-27 DIAGNOSIS — D500 Iron deficiency anemia secondary to blood loss (chronic): Secondary | ICD-10-CM | POA: Diagnosis not present

## 2022-06-27 DIAGNOSIS — D45 Polycythemia vera: Secondary | ICD-10-CM | POA: Insufficient documentation

## 2022-06-27 DIAGNOSIS — B1920 Unspecified viral hepatitis C without hepatic coma: Secondary | ICD-10-CM | POA: Diagnosis not present

## 2022-06-27 LAB — CBC WITH DIFFERENTIAL (CANCER CENTER ONLY)
Abs Immature Granulocytes: 0.02 10*3/uL (ref 0.00–0.07)
Basophils Absolute: 0 10*3/uL (ref 0.0–0.1)
Basophils Relative: 0 %
Eosinophils Absolute: 0.1 10*3/uL (ref 0.0–0.5)
Eosinophils Relative: 1 %
HCT: 39.8 % (ref 36.0–46.0)
Hemoglobin: 13.6 g/dL (ref 12.0–15.0)
Immature Granulocytes: 0 %
Lymphocytes Relative: 23 %
Lymphs Abs: 1.5 10*3/uL (ref 0.7–4.0)
MCH: 32.6 pg (ref 26.0–34.0)
MCHC: 34.2 g/dL (ref 30.0–36.0)
MCV: 95.4 fL (ref 80.0–100.0)
Monocytes Absolute: 0.7 10*3/uL (ref 0.1–1.0)
Monocytes Relative: 10 %
Neutro Abs: 4.3 10*3/uL (ref 1.7–7.7)
Neutrophils Relative %: 66 %
Platelet Count: 258 10*3/uL (ref 150–400)
RBC: 4.17 MIL/uL (ref 3.87–5.11)
RDW: 12.4 % (ref 11.5–15.5)
WBC Count: 6.6 10*3/uL (ref 4.0–10.5)
nRBC: 0 % (ref 0.0–0.2)

## 2022-06-27 LAB — CMP (CANCER CENTER ONLY)
ALT: 11 U/L (ref 0–44)
AST: 25 U/L (ref 15–41)
Albumin: 4.1 g/dL (ref 3.5–5.0)
Alkaline Phosphatase: 66 U/L (ref 38–126)
Anion gap: 12 (ref 5–15)
BUN: 13 mg/dL (ref 8–23)
CO2: 28 mmol/L (ref 22–32)
Calcium: 9.2 mg/dL (ref 8.9–10.3)
Chloride: 91 mmol/L — ABNORMAL LOW (ref 98–111)
Creatinine: 0.78 mg/dL (ref 0.44–1.00)
GFR, Estimated: >60 mL/min (ref >60)
Glucose, Bld: 129 mg/dL — ABNORMAL HIGH (ref 70–99)
Potassium: 4.2 mmol/L (ref 3.5–5.1)
Sodium: 131 mmol/L — ABNORMAL LOW (ref 135–145)
Total Bilirubin: 0.6 mg/dL (ref 0.3–1.2)
Total Protein: 7.3 g/dL (ref 6.5–8.1)

## 2022-06-27 LAB — FERRITIN: Ferritin: 57 ng/mL (ref 11–307)

## 2022-06-27 LAB — LACTATE DEHYDROGENASE: LDH: 145 U/L (ref 98–192)

## 2022-06-27 NOTE — Progress Notes (Signed)
Hematology and Oncology Follow Up Visit  [name] [id number] 04-Mar-1950 72 y.o. 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    She continues to have a lot of family stressors. She reports that this stress is causing her fatigue, weight loss, high blood pressure. She is followed by PCP for this.   When we last saw her, her ferritin was 49 with an iron saturation of 15%.  She has had no fever.  She has had no cough.  She has had no nausea or vomiting.  There is been no change in bowel or bladder habits. No chest pain, SOB, peripheral edema, current headache, visual change  Currently, I would say performance status is probably ECOG 0.  Wt Readings from Last 3 Encounters:  06/27/22 114 lb (51.7 kg)  04/24/22 114 lb 12.8 oz (52.1 kg)  02/03/22 118 lb 1.3 oz (53.6 kg)    Medications:  Allergies as of 06/27/2022       Reactions   Diphenhydramine Hives   Sulfa Antibiotics Nausea And Vomiting   Sulfa Antibiotics Nausea Only   Sulfur Nausea Only   Bactrim [sulfamethoxazole-trimethoprim] Nausea And Vomiting   Nsaids Other (See Comments)   She doesn't take Nsaids because it runs up her BP.   Sulfamethoxazole-trimethoprim Nausea And Vomiting   Other reaction(s): Nausea And Vomiting Other Reaction(s): GI Intolerance        Medication List        Accurate as of Jun 27, 2022  2:17 PM. If you have any questions, ask your nurse or doctor.          acetaminophen 325 MG tablet Commonly known as: TYLENOL Take 325-650 mg by mouth every 6 (six) hours as needed for mild pain or headache.   ALPRAZolam 0.5 MG tablet Commonly known as: XANAX Take 0.5 mg by mouth at bedtime.   Ascorbic Acid 500 MG Caps Take  by mouth.   Breo Ellipta 100-25 MCG/ACT Aepb Generic drug: fluticasone furoate-vilanterol INHALE ONE PUFF ONCE A DAY   clopidogrel 75 MG tablet Commonly known as: PLAVIX Take 1 tablet (75 mg total) by mouth daily.   clotrimazole-betamethasone cream Commonly known as: LOTRISONE Apply 1 application  topically 2 (two) times daily as needed (to affected area).   escitalopram 10 MG tablet Commonly known as: LEXAPRO Take 10 mg by mouth daily. Patient is taking 5 MG daily   hydrochlorothiazide 25 MG tablet Commonly known as: HYDRODIURIL TAKE 1 TABLET BY MOUTH ONCE DAILY.   lansoprazole 30 MG capsule Commonly known as: PREVACID Take 30 mg by mouth daily as needed (for ulcer/acid reflex). Ulcer/ acid reflux   metoprolol succinate 100 MG 24 hr tablet Commonly known as: TOPROL-XL TAKE 1 & 1/2 (ONE & ONE-HALF) TABLETS BY MOUTH ONCE DAILY . Please make yearly appt with Dr. Katrinka Blazing for May for future refills. 1st attempt   nitroGLYCERIN 0.4 MG SL tablet Commonly known as: NITROSTAT   rosuvastatin 10 MG tablet Commonly known as: CRESTOR Take 1 tablet (10 mg total) by mouth every other day.   telmisartan 20 MG tablet Commonly known as: MICARDIS Take 1 tablet (20 mg total) by mouth daily.   Ventolin HFA  108 (90 Base) MCG/ACT inhaler Generic drug: albuterol Inhale into the lungs.   VITAMIN C PO Take 2 tablets by mouth daily.   vitamin E 180 MG (400 UNITS) capsule Take 400 Units by mouth daily.        Allergies:  Allergies  Allergen Reactions   Diphenhydramine Hives   Sulfa Antibiotics Nausea And Vomiting   Sulfa Antibiotics Nausea Only   Sulfur Nausea Only   Bactrim [Sulfamethoxazole-Trimethoprim] Nausea And Vomiting   Nsaids Other (See Comments)    She doesn't take Nsaids because it runs up her BP.   Sulfamethoxazole-Trimethoprim Nausea And Vomiting    Other reaction(s): Nausea And Vomiting  Other Reaction(s): GI Intolerance    Past Medical History, Surgical  history, Social history, and Family History were reviewed and updated.  Review of Systems: Review of Systems  Constitutional: Negative.   HENT: Negative.    Eyes: Negative.   Respiratory: Negative.    Cardiovascular: Negative.   Gastrointestinal: Negative.   Genitourinary: Negative.   Musculoskeletal:  Positive for myalgias.  Skin: Negative.   Neurological: Negative.   Endo/Heme/Allergies: Negative.   Psychiatric/Behavioral: Negative.       Physical Exam:  weight is 114 lb (51.7 kg). Her oral temperature is 98.2 F (36.8 C). Her blood pressure is 182/77 (abnormal) and her pulse is 55 (abnormal). Her respiration is 17 and oxygen saturation is 100%.   Wt Readings from Last 3 Encounters:  06/27/22 114 lb (51.7 kg)  04/24/22 114 lb 12.8 oz (52.1 kg)  02/03/22 118 lb 1.3 oz (53.6 kg)    Physical Exam Vitals reviewed.  HENT:     Head: Normocephalic and atraumatic.  Eyes:     Pupils: Pupils are equal, round, and reactive to light.  Cardiovascular:     Rate and Rhythm: Normal rate and regular rhythm.     Heart sounds: Normal heart sounds.  Pulmonary:     Effort: Pulmonary effort is normal.     Breath sounds: Normal breath sounds.  Abdominal:     General: Bowel sounds are normal.     Palpations: Abdomen is soft.  Musculoskeletal:        General: No tenderness or deformity. Normal range of motion.     Cervical back: Normal range of motion.  Lymphadenopathy:     Cervical: No cervical adenopathy.  Skin:    General: Skin is warm and dry.     Findings: No erythema or rash.  Neurological:     Mental Status: She is alert and oriented to person, place, and time.  Psychiatric:        Behavior: Behavior normal.        Thought Content: Thought content normal.        Judgment: Judgment normal.      Lab Results  Component Value Date   WBC 6.6 06/27/2022   HGB 13.6 06/27/2022   HCT 39.8 06/27/2022   MCV 95.4 06/27/2022   PLT 258 06/27/2022   Lab Results  Component  Value Date   FERRITIN 49 02/03/2022   IRON 65 02/03/2022   TIBC 438 02/03/2022   UIBC 373 02/03/2022   IRONPCTSAT 15 02/03/2022   Lab Results  Component Value Date   RETICCTPCT 1.2 08/03/2010   RBC 4.17 06/27/2022   RETICCTABS 61.3 08/03/2010   No results found for: "KPAFRELGTCHN", "LAMBDASER", "KAPLAMBRATIO" No results found for: "IGGSERUM", "IGA", "IGMSERUM" No results found for: "TOTALPROTELP", "ALBUMINELP", "A1GS", "A2GS", "BETS", "BETA2SER", "GAMS", "MSPIKE", "SPEI"   Chemistry  Component Value Date/Time   NA 134 (L) 02/03/2022 1252   NA 137 04/01/2019 1649   NA 139 01/03/2017 1327   NA 136 12/28/2015 1311   K 4.0 02/03/2022 1252   K 3.7 01/03/2017 1327   K 4.9 12/28/2015 1311   CL 94 (L) 02/03/2022 1252   CL 94 (L) 01/03/2017 1327   CO2 31 02/03/2022 1252   CO2 31 01/03/2017 1327   CO2 28 12/28/2015 1311   BUN 11 02/03/2022 1252   BUN 14 04/01/2019 1649   BUN 12 01/03/2017 1327   BUN 10.4 12/28/2015 1311   CREATININE 0.73 02/03/2022 1252   CREATININE 1.0 01/03/2017 1327   CREATININE 0.9 12/28/2015 1311      Component Value Date/Time   CALCIUM 9.1 02/03/2022 1252   CALCIUM 8.8 01/03/2017 1327   CALCIUM 9.6 12/28/2015 1311   ALKPHOS 67 02/03/2022 1252   ALKPHOS 85 (H) 01/03/2017 1327   ALKPHOS 83 12/28/2015 1311   AST 21 02/03/2022 1252   AST 25 12/28/2015 1311   ALT 11 02/03/2022 1252   ALT 19 01/03/2017 1327   ALT 13 12/28/2015 1311   BILITOT 0.7 02/03/2022 1252   BILITOT 0.74 12/28/2015 1311       Impression and Plan: Ms. Riesterer is a 72 yo caucasian female with polycythemia.  She is "triple negative".  We reviewed her blood counts. She does not need a phlebotomy today. We had a long discussion regarding her stress level and health. She will call her PCP today to schedule a follow up to discuss further management. She will try to hydrate with water and monitor blood pressure.  RTC 3 months APP, labs ( CBC, CMP, iron, ferritin, LDH) +- phlebotomy    Rushie Chestnut, PA-C 5/7/20242:17 PM

## 2022-06-28 LAB — IRON AND IRON BINDING CAPACITY (CC-WL,HP ONLY)
Iron: 134 ug/dL (ref 28–170)
Saturation Ratios: 32 % — ABNORMAL HIGH (ref 10.4–31.8)
TIBC: 414 ug/dL (ref 250–450)
UIBC: 280 ug/dL (ref 148–442)

## 2022-08-07 DIAGNOSIS — R399 Unspecified symptoms and signs involving the genitourinary system: Secondary | ICD-10-CM | POA: Diagnosis not present

## 2022-08-07 DIAGNOSIS — Z0000 Encounter for general adult medical examination without abnormal findings: Secondary | ICD-10-CM | POA: Diagnosis not present

## 2022-08-07 DIAGNOSIS — R310 Gross hematuria: Secondary | ICD-10-CM | POA: Diagnosis not present

## 2022-08-17 DIAGNOSIS — R399 Unspecified symptoms and signs involving the genitourinary system: Secondary | ICD-10-CM | POA: Diagnosis not present

## 2022-08-17 DIAGNOSIS — R310 Gross hematuria: Secondary | ICD-10-CM | POA: Diagnosis not present

## 2022-08-21 DIAGNOSIS — Z9181 History of falling: Secondary | ICD-10-CM | POA: Diagnosis not present

## 2022-08-21 DIAGNOSIS — J449 Chronic obstructive pulmonary disease, unspecified: Secondary | ICD-10-CM | POA: Diagnosis not present

## 2022-08-21 DIAGNOSIS — I10 Essential (primary) hypertension: Secondary | ICD-10-CM | POA: Diagnosis not present

## 2022-08-21 DIAGNOSIS — Z0000 Encounter for general adult medical examination without abnormal findings: Secondary | ICD-10-CM | POA: Diagnosis not present

## 2022-08-21 DIAGNOSIS — D45 Polycythemia vera: Secondary | ICD-10-CM | POA: Diagnosis not present

## 2022-08-21 DIAGNOSIS — E871 Hypo-osmolality and hyponatremia: Secondary | ICD-10-CM | POA: Diagnosis not present

## 2022-08-21 DIAGNOSIS — E782 Mixed hyperlipidemia: Secondary | ICD-10-CM | POA: Diagnosis not present

## 2022-08-21 DIAGNOSIS — R7303 Prediabetes: Secondary | ICD-10-CM | POA: Diagnosis not present

## 2022-08-21 DIAGNOSIS — F419 Anxiety disorder, unspecified: Secondary | ICD-10-CM | POA: Diagnosis not present

## 2022-08-21 DIAGNOSIS — F330 Major depressive disorder, recurrent, mild: Secondary | ICD-10-CM | POA: Diagnosis not present

## 2022-08-21 DIAGNOSIS — Z1331 Encounter for screening for depression: Secondary | ICD-10-CM | POA: Diagnosis not present

## 2022-08-21 DIAGNOSIS — I2510 Atherosclerotic heart disease of native coronary artery without angina pectoris: Secondary | ICD-10-CM | POA: Diagnosis not present

## 2022-08-21 DIAGNOSIS — R946 Abnormal results of thyroid function studies: Secondary | ICD-10-CM | POA: Diagnosis not present

## 2022-08-21 DIAGNOSIS — Z23 Encounter for immunization: Secondary | ICD-10-CM | POA: Diagnosis not present

## 2022-08-21 DIAGNOSIS — I700 Atherosclerosis of aorta: Secondary | ICD-10-CM | POA: Diagnosis not present

## 2022-08-22 ENCOUNTER — Other Ambulatory Visit: Payer: Self-pay | Admitting: Internal Medicine

## 2022-08-22 DIAGNOSIS — R634 Abnormal weight loss: Secondary | ICD-10-CM

## 2022-09-05 DIAGNOSIS — R946 Abnormal results of thyroid function studies: Secondary | ICD-10-CM | POA: Diagnosis not present

## 2022-09-05 DIAGNOSIS — E871 Hypo-osmolality and hyponatremia: Secondary | ICD-10-CM | POA: Diagnosis not present

## 2022-09-14 ENCOUNTER — Ambulatory Visit
Admission: RE | Admit: 2022-09-14 | Discharge: 2022-09-14 | Disposition: A | Discharge status: Home / Self Care | Payer: PPO | Source: Ambulatory Visit | Attending: Internal Medicine | Admitting: Internal Medicine

## 2022-09-14 DIAGNOSIS — R918 Other nonspecific abnormal finding of lung field: Secondary | ICD-10-CM | POA: Diagnosis not present

## 2022-09-14 DIAGNOSIS — R634 Abnormal weight loss: Secondary | ICD-10-CM | POA: Diagnosis not present

## 2022-09-14 DIAGNOSIS — R079 Chest pain, unspecified: Secondary | ICD-10-CM | POA: Diagnosis not present

## 2022-09-14 DIAGNOSIS — R0602 Shortness of breath: Secondary | ICD-10-CM | POA: Diagnosis not present

## 2022-09-14 DIAGNOSIS — R109 Unspecified abdominal pain: Secondary | ICD-10-CM | POA: Diagnosis not present

## 2022-09-14 DIAGNOSIS — R194 Change in bowel habit: Secondary | ICD-10-CM | POA: Diagnosis not present

## 2022-09-14 MED ORDER — IOPAMIDOL (ISOVUE-300) INJECTION 61%
100.0000 mL | Freq: Once | INTRAVENOUS | Status: AC | PRN
  Administered 2022-09-14: 100 mL via INTRAVENOUS

## 2022-10-10 DIAGNOSIS — I10 Essential (primary) hypertension: Secondary | ICD-10-CM | POA: Diagnosis not present

## 2022-10-10 DIAGNOSIS — J449 Chronic obstructive pulmonary disease, unspecified: Secondary | ICD-10-CM | POA: Diagnosis not present

## 2022-10-10 DIAGNOSIS — F17210 Nicotine dependence, cigarettes, uncomplicated: Secondary | ICD-10-CM | POA: Diagnosis not present

## 2022-10-10 DIAGNOSIS — J439 Emphysema, unspecified: Secondary | ICD-10-CM | POA: Diagnosis not present

## 2022-11-13 ENCOUNTER — Inpatient Hospital Stay: Payer: PPO | Attending: Hematology & Oncology

## 2022-11-13 ENCOUNTER — Inpatient Hospital Stay: Payer: PPO | Admitting: Medical Oncology

## 2022-11-13 ENCOUNTER — Other Ambulatory Visit: Payer: Self-pay

## 2022-11-13 ENCOUNTER — Encounter: Payer: Self-pay | Admitting: Medical Oncology

## 2022-11-13 VITALS — BP 136/79 | HR 52 | Temp 97.9°F | Resp 18 | Ht 60.0 in | Wt 111.1 lb

## 2022-11-13 DIAGNOSIS — D751 Secondary polycythemia: Secondary | ICD-10-CM

## 2022-11-13 DIAGNOSIS — D45 Polycythemia vera: Secondary | ICD-10-CM | POA: Diagnosis not present

## 2022-11-13 DIAGNOSIS — D500 Iron deficiency anemia secondary to blood loss (chronic): Secondary | ICD-10-CM

## 2022-11-13 LAB — CBC WITH DIFFERENTIAL (CANCER CENTER ONLY)
Abs Immature Granulocytes: 0.03 10*3/uL (ref 0.00–0.07)
Basophils Absolute: 0 10*3/uL (ref 0.0–0.1)
Basophils Relative: 1 %
Eosinophils Absolute: 0.1 10*3/uL (ref 0.0–0.5)
Eosinophils Relative: 1 %
HCT: 46.5 % — ABNORMAL HIGH (ref 36.0–46.0)
Hemoglobin: 15.6 g/dL — ABNORMAL HIGH (ref 12.0–15.0)
Immature Granulocytes: 1 %
Lymphocytes Relative: 22 %
Lymphs Abs: 1.3 10*3/uL (ref 0.7–4.0)
MCH: 32.2 pg (ref 26.0–34.0)
MCHC: 33.5 g/dL (ref 30.0–36.0)
MCV: 96.1 fL (ref 80.0–100.0)
Monocytes Absolute: 0.7 10*3/uL (ref 0.1–1.0)
Monocytes Relative: 11 %
Neutro Abs: 4 10*3/uL (ref 1.7–7.7)
Neutrophils Relative %: 64 %
Platelet Count: 255 10*3/uL (ref 150–400)
RBC: 4.84 MIL/uL (ref 3.87–5.11)
RDW: 12 % (ref 11.5–15.5)
WBC Count: 6.2 10*3/uL (ref 4.0–10.5)
nRBC: 0 % (ref 0.0–0.2)

## 2022-11-13 LAB — CMP (CANCER CENTER ONLY)
ALT: 10 U/L (ref 0–44)
AST: 18 U/L (ref 15–41)
Albumin: 4.3 g/dL (ref 3.5–5.0)
Alkaline Phosphatase: 69 U/L (ref 38–126)
Anion gap: 6 (ref 5–15)
BUN: 15 mg/dL (ref 8–23)
CO2: 34 mmol/L — ABNORMAL HIGH (ref 22–32)
Calcium: 9.5 mg/dL (ref 8.9–10.3)
Chloride: 93 mmol/L — ABNORMAL LOW (ref 98–111)
Creatinine: 0.83 mg/dL (ref 0.44–1.00)
GFR, Estimated: >60 mL/min (ref >60)
Glucose, Bld: 105 mg/dL — ABNORMAL HIGH (ref 70–99)
Potassium: 4.4 mmol/L (ref 3.5–5.1)
Sodium: 133 mmol/L — ABNORMAL LOW (ref 135–145)
Total Bilirubin: 0.7 mg/dL (ref 0.3–1.2)
Total Protein: 7.1 g/dL (ref 6.5–8.1)

## 2022-11-13 LAB — LACTATE DEHYDROGENASE: LDH: 175 U/L (ref 98–192)

## 2022-11-13 LAB — IRON AND IRON BINDING CAPACITY (CC-WL,HP ONLY)
Iron: 88 ug/dL (ref 28–170)
Saturation Ratios: 19 % (ref 10.4–31.8)
TIBC: 455 ug/dL — ABNORMAL HIGH (ref 250–450)
UIBC: 367 ug/dL (ref 148–442)

## 2022-11-13 LAB — FERRITIN: Ferritin: 53 ng/mL (ref 11–307)

## 2022-11-13 NOTE — Progress Notes (Signed)
Hematology and Oncology Follow Up Visit  [name] [id number] 1950-08-11 73 y.o. 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    Has a lot of life stressors which she reports is causing her fatigue, weight loss, high blood pressure. She is followed by PCP for this. Luckily BP has improved. Weight is down though. Recently had a CT scan screening for her unintentional weight loss which did not show any concern for malignancy. She eats one meal per day due to stress and lack of appetite.   When we last saw her in May, her ferritin was 57 with an iron saturation of 32%. Her HCT at this time was 39.8 with a Hgb of 13.6.   She has had no fever.  She has had no cough.  She has had no nausea or vomiting.  There is been no change in bowel or bladder habits. No chest pain, SOB, peripheral edema, current headache, visual change  Currently, I would say performance status is probably ECOG 0.  Wt Readings from Last 3 Encounters:  11/13/22 111 lb 1.9 oz (50.4 kg)  06/27/22 114 lb (51.7 kg)  04/24/22 114 lb 12.8 oz (52.1 kg)    Medications:  Allergies as of 11/13/2022       Reactions   Diphenhydramine Hives   Sulfa Antibiotics Nausea And Vomiting   Sulfa Antibiotics Nausea Only   Sulfur Nausea Only   Bactrim [sulfamethoxazole-trimethoprim] Nausea And Vomiting   Nsaids Other (See Comments)   She doesn't take Nsaids because it runs up her BP.   Sulfamethoxazole-trimethoprim Nausea And Vomiting        Medication List        Accurate as of November 13, 2022 12:18 PM. If you have any questions, ask your nurse or doctor.          acetaminophen 325 MG tablet Commonly known as: TYLENOL Take 325-650 mg by mouth every 6  (six) hours as needed for mild pain or headache.   ALPRAZolam 0.5 MG tablet Commonly known as: XANAX Take 0.5 mg by mouth at bedtime.   amLODipine 10 MG tablet Commonly known as: NORVASC Take 10 mg by mouth daily.   Ascorbic Acid 500 MG Caps Take by mouth.   Breo Ellipta 100-25 MCG/INH Aepb Generic drug: fluticasone furoate-vilanterol INHALE ONE PUFF ONCE A DAY   clopidogrel 75 MG tablet Commonly known as: PLAVIX Take 1 tablet (75 mg total) by mouth daily.   clotrimazole-betamethasone cream Commonly known as: LOTRISONE Apply 1 application  topically 2 (two) times daily as needed (to affected area).   escitalopram 10 MG tablet Commonly known as: LEXAPRO Take 10 mg by mouth daily. Patient is taking 5 MG daily   hydrochlorothiazide 12.5 MG tablet Commonly known as: HYDRODIURIL Take 12.5 mg by mouth daily. What changed: Another medication with the same name was removed. Continue taking this medication, and follow the directions you see here. Changed by: Rushie Chestnut   lansoprazole 30 MG capsule Commonly known as: PREVACID Take 30 mg by mouth daily as needed (for ulcer/acid reflex). Ulcer/ acid reflux   metoprolol succinate 100 MG 24 hr tablet Commonly known as: TOPROL-XL TAKE 1 & 1/2 (ONE &  ONE-HALF) TABLETS BY MOUTH ONCE DAILY . Please make yearly appt with Dr. Katrinka Blazing for May for future refills. 1st attempt   nitroGLYCERIN 0.4 MG SL tablet Commonly known as: NITROSTAT   rosuvastatin 10 MG tablet Commonly known as: CRESTOR Take 1 tablet (10 mg total) by mouth every other day.   telmisartan 20 MG tablet Commonly known as: MICARDIS Take 1 tablet (20 mg total) by mouth daily.   telmisartan 40 MG tablet Commonly known as: MICARDIS Take 40 mg by mouth daily.   Ventolin HFA 108 (90 Base) MCG/ACT inhaler Generic drug: albuterol Inhale into the lungs.   VITAMIN C PO Take 2 tablets by mouth daily.   vitamin E 180 MG (400 UNITS) capsule Take 400 Units by mouth  daily.        Allergies:  Allergies  Allergen Reactions   Diphenhydramine Hives   Sulfa Antibiotics Nausea And Vomiting   Sulfa Antibiotics Nausea Only   Sulfur Nausea Only   Bactrim [Sulfamethoxazole-Trimethoprim] Nausea And Vomiting   Nsaids Other (See Comments)    She doesn't take Nsaids because it runs up her BP.   Sulfamethoxazole-Trimethoprim Nausea And Vomiting    Past Medical History, Surgical history, Social history, and Family History were reviewed and updated.  Review of Systems: Review of Systems  Constitutional: Negative.   HENT: Negative.    Eyes: Negative.   Respiratory: Negative.    Cardiovascular: Negative.   Gastrointestinal: Negative.   Genitourinary: Negative.   Musculoskeletal:  Positive for myalgias.  Skin: Negative.   Neurological: Negative.   Endo/Heme/Allergies: Negative.   Psychiatric/Behavioral: Negative.      Physical Exam:  height is 5' (1.524 m) and weight is 111 lb 1.9 oz (50.4 kg). Her oral temperature is 97.9 F (36.6 C). Her blood pressure is 136/79 and her pulse is 52 (abnormal). Her respiration is 18 and oxygen saturation is 98%.   Wt Readings from Last 3 Encounters:  11/13/22 111 lb 1.9 oz (50.4 kg)  06/27/22 114 lb (51.7 kg)  04/24/22 114 lb 12.8 oz (52.1 kg)    Physical Exam Vitals reviewed.  HENT:     Head: Normocephalic and atraumatic.  Eyes:     Pupils: Pupils are equal, round, and reactive to light.  Cardiovascular:     Rate and Rhythm: Normal rate and regular rhythm.     Heart sounds: Normal heart sounds.  Pulmonary:     Effort: Pulmonary effort is normal.     Breath sounds: Normal breath sounds.  Abdominal:     General: Bowel sounds are normal.     Palpations: Abdomen is soft.  Musculoskeletal:        General: No tenderness or deformity. Normal range of motion.     Cervical back: Normal range of motion.  Lymphadenopathy:     Cervical: No cervical adenopathy.  Skin:    General: Skin is warm and dry.      Findings: No erythema or rash.  Neurological:     Mental Status: She is alert and oriented to person, place, and time.  Psychiatric:        Behavior: Behavior normal.        Thought Content: Thought content normal.        Judgment: Judgment normal.      Lab Results  Component Value Date   WBC 6.2 11/13/2022   HGB 15.6 (H) 11/13/2022   HCT 46.5 (H) 11/13/2022   MCV 96.1 11/13/2022   PLT 255 11/13/2022   Lab Results  Component Value Date   FERRITIN 57 06/27/2022   IRON 134 06/27/2022   TIBC 414 06/27/2022   UIBC 280 06/27/2022   IRONPCTSAT 32 (H) 06/27/2022   Lab Results  Component Value Date   RETICCTPCT 1.2 08/03/2010   RBC 4.84 11/13/2022   RETICCTABS 61.3 08/03/2010   No results found for: "KPAFRELGTCHN", "LAMBDASER", "KAPLAMBRATIO" No results found for: "IGGSERUM", "IGA", "IGMSERUM" No results found for: "TOTALPROTELP", "ALBUMINELP", "A1GS", "A2GS", "BETS", "BETA2SER", "GAMS", "MSPIKE", "SPEI"   Chemistry      Component Value Date/Time   NA 133 (L) 11/13/2022 1135   NA 137 04/01/2019 1649   NA 139 01/03/2017 1327   NA 136 12/28/2015 1311   K 4.4 11/13/2022 1135   K 3.7 01/03/2017 1327   K 4.9 12/28/2015 1311   CL 93 (L) 11/13/2022 1135   CL 94 (L) 01/03/2017 1327   CO2 34 (H) 11/13/2022 1135   CO2 31 01/03/2017 1327   CO2 28 12/28/2015 1311   BUN 15 11/13/2022 1135   BUN 14 04/01/2019 1649   BUN 12 01/03/2017 1327   BUN 10.4 12/28/2015 1311   CREATININE 0.83 11/13/2022 1135   CREATININE 1.0 01/03/2017 1327   CREATININE 0.9 12/28/2015 1311      Component Value Date/Time   CALCIUM 9.5 11/13/2022 1135   CALCIUM 8.8 01/03/2017 1327   CALCIUM 9.6 12/28/2015 1311   ALKPHOS 69 11/13/2022 1135   ALKPHOS 85 (H) 01/03/2017 1327   ALKPHOS 83 12/28/2015 1311   AST 18 11/13/2022 1135   AST 25 12/28/2015 1311   ALT 10 11/13/2022 1135   ALT 19 01/03/2017 1327   ALT 13 12/28/2015 1311   BILITOT 0.7 11/13/2022 1135   BILITOT 0.74 12/28/2015 1311       Impression and Plan: [name] is a 72 yo caucasian female with polycythemia.  She is "triple negative". Reviewed with Dr. Myna Hidalgo.   Hgb today is 15.6 with a HCT of 46.3. Iron studies pending but likely elevated. We also discussed her CO level. She will check on her carbon monoxide detectors in the house. She drives a new unmodified car. She also is meeting with a pulmonologist.    Disposition Phlebotomy recommended - RTC 3 months APP, labs ( CBC, CMP, iron, ferritin, LDH)   Brand Males [place], PA-C 9/23/202412:18 PM

## 2022-11-16 ENCOUNTER — Telehealth: Payer: Self-pay | Admitting: *Deleted

## 2022-11-16 NOTE — Telephone Encounter (Signed)
Called patient and lvm of scheduling a phlebotomy here at [facility]. Patient is requesting [facility] , however they are at capacity and notified her of that as well.

## 2022-11-23 ENCOUNTER — Inpatient Hospital Stay: Payer: PPO | Attending: Medical Oncology

## 2022-11-23 DIAGNOSIS — D45 Polycythemia vera: Secondary | ICD-10-CM | POA: Diagnosis not present

## 2022-11-23 NOTE — Progress Notes (Signed)
[name] presents today for phlebotomy per MD orders. Phlebotomy procedure started at 1548 and ended at 1600. 525 grams removed from rt [facility] using 20g IV cath by HPoindexter, RN. Pt declined post observation.  Patient tolerated procedure well. IV needle removed intact.

## 2022-11-23 NOTE — Patient Instructions (Signed)

## 2023-01-02 DIAGNOSIS — J449 Chronic obstructive pulmonary disease, unspecified: Secondary | ICD-10-CM | POA: Diagnosis not present

## 2023-01-02 DIAGNOSIS — I10 Essential (primary) hypertension: Secondary | ICD-10-CM | POA: Diagnosis not present

## 2023-01-02 DIAGNOSIS — R918 Other nonspecific abnormal finding of lung field: Secondary | ICD-10-CM | POA: Diagnosis not present

## 2023-01-04 DIAGNOSIS — H25813 Combined forms of age-related cataract, bilateral: Secondary | ICD-10-CM | POA: Diagnosis not present

## 2023-01-04 DIAGNOSIS — H02831 Dermatochalasis of right upper eyelid: Secondary | ICD-10-CM | POA: Diagnosis not present

## 2023-01-04 DIAGNOSIS — H02834 Dermatochalasis of left upper eyelid: Secondary | ICD-10-CM | POA: Diagnosis not present

## 2023-01-30 DIAGNOSIS — F17210 Nicotine dependence, cigarettes, uncomplicated: Secondary | ICD-10-CM | POA: Diagnosis not present

## 2023-01-30 DIAGNOSIS — F331 Major depressive disorder, recurrent, moderate: Secondary | ICD-10-CM | POA: Diagnosis not present

## 2023-01-30 DIAGNOSIS — I779 Disorder of arteries and arterioles, unspecified: Secondary | ICD-10-CM | POA: Diagnosis not present

## 2023-01-30 DIAGNOSIS — N1831 Chronic kidney disease, stage 3a: Secondary | ICD-10-CM | POA: Diagnosis not present

## 2023-01-30 DIAGNOSIS — I10 Essential (primary) hypertension: Secondary | ICD-10-CM | POA: Diagnosis not present

## 2023-02-06 DIAGNOSIS — J449 Chronic obstructive pulmonary disease, unspecified: Secondary | ICD-10-CM | POA: Diagnosis not present

## 2023-02-06 DIAGNOSIS — I10 Essential (primary) hypertension: Secondary | ICD-10-CM | POA: Diagnosis not present

## 2023-02-06 DIAGNOSIS — F17210 Nicotine dependence, cigarettes, uncomplicated: Secondary | ICD-10-CM | POA: Diagnosis not present

## 2023-02-09 DIAGNOSIS — I6523 Occlusion and stenosis of bilateral carotid arteries: Secondary | ICD-10-CM | POA: Diagnosis not present

## 2023-02-09 DIAGNOSIS — I779 Disorder of arteries and arterioles, unspecified: Secondary | ICD-10-CM | POA: Diagnosis not present

## 2023-02-12 ENCOUNTER — Inpatient Hospital Stay: Payer: PPO | Admitting: Medical Oncology

## 2023-02-12 ENCOUNTER — Inpatient Hospital Stay: Payer: PPO | Attending: Medical Oncology

## 2023-02-12 ENCOUNTER — Other Ambulatory Visit: Payer: Self-pay | Admitting: Medical Oncology

## 2023-02-12 DIAGNOSIS — D751 Secondary polycythemia: Secondary | ICD-10-CM

## 2023-02-12 DIAGNOSIS — D500 Iron deficiency anemia secondary to blood loss (chronic): Secondary | ICD-10-CM

## 2023-03-03 DIAGNOSIS — Z48812 Encounter for surgical aftercare following surgery on the circulatory system: Secondary | ICD-10-CM | POA: Diagnosis not present

## 2023-05-15 ENCOUNTER — Other Ambulatory Visit: Payer: Self-pay | Admitting: Interventional Cardiology

## 2023-06-13 ENCOUNTER — Other Ambulatory Visit: Payer: Self-pay | Admitting: Interventional Cardiology

## 2023-06-14 ENCOUNTER — Telehealth: Payer: Self-pay | Admitting: Gastroenterology

## 2023-06-14 NOTE — Telephone Encounter (Signed)
 Good afternoon Dr. Brice Campi  The following patient is wishing to transfer care to us  for a colonoscopy and has requested you to be the provider.  She has been a patient with [facility] and since Dr. Andriette Keeling has retired she isn't comfortable to continue at that office. Records are available in Epic care everywhere. Please review and advise of scheduling. Thank you.

## 2023-06-15 NOTE — Telephone Encounter (Signed)
 Based on review of the patient's chart, looks like full last colonoscopy was 2016. 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 She was due for a 5-year follow-up for colonoscopy in 2021, and there is no records of that being completed. She can be scheduled with me for direct [facility] colonoscopy for surveillance of previous polyps and prior history of adenomas. Once she has undergone her colonoscopy, her GI care will be assumed by the [facility] GI service. If she has other issues that she needs to discuss before her colonoscopy, she can be scheduled with me (in a new patient slot) or with any of my APP's in pod C. Thanks. GM

## 2023-06-15 NOTE — Telephone Encounter (Signed)
 Left vm for PT to call and schedule accordingly.

## 2023-06-18 ENCOUNTER — Inpatient Hospital Stay: Attending: Hematology & Oncology

## 2023-06-18 ENCOUNTER — Encounter: Payer: Self-pay | Admitting: Family

## 2023-06-18 ENCOUNTER — Other Ambulatory Visit: Payer: Self-pay

## 2023-06-18 ENCOUNTER — Inpatient Hospital Stay: Admitting: Family

## 2023-06-18 VITALS — BP 159/66 | HR 61 | Temp 98.0°F | Resp 18 | Ht 61.0 in | Wt 113.0 lb

## 2023-06-18 DIAGNOSIS — D751 Secondary polycythemia: Secondary | ICD-10-CM

## 2023-06-18 DIAGNOSIS — D500 Iron deficiency anemia secondary to blood loss (chronic): Secondary | ICD-10-CM

## 2023-06-18 DIAGNOSIS — D45 Polycythemia vera: Secondary | ICD-10-CM | POA: Insufficient documentation

## 2023-06-18 DIAGNOSIS — Z8619 Personal history of other infectious and parasitic diseases: Secondary | ICD-10-CM | POA: Diagnosis not present

## 2023-06-18 LAB — CMP (CANCER CENTER ONLY)
ALT: 9 U/L (ref 0–44)
AST: 18 U/L (ref 15–41)
Albumin: 4.5 g/dL (ref 3.5–5.0)
Alkaline Phosphatase: 64 U/L (ref 38–126)
Anion gap: 12 (ref 5–15)
BUN: 15 mg/dL (ref 8–23)
CO2: 29 mmol/L (ref 22–32)
Calcium: 9.9 mg/dL (ref 8.9–10.3)
Chloride: 90 mmol/L — ABNORMAL LOW (ref 98–111)
Creatinine: 0.87 mg/dL (ref 0.44–1.00)
GFR, Estimated: >60 mL/min (ref >60)
Glucose, Bld: 151 mg/dL — ABNORMAL HIGH (ref 70–99)
Potassium: 4 mmol/L (ref 3.5–5.1)
Sodium: 131 mmol/L — ABNORMAL LOW (ref 135–145)
Total Bilirubin: 0.6 mg/dL (ref 0.0–1.2)
Total Protein: 7.4 g/dL (ref 6.5–8.1)

## 2023-06-18 LAB — CBC WITH DIFFERENTIAL (CANCER CENTER ONLY)
Abs Immature Granulocytes: 0.02 10*3/uL (ref 0.00–0.07)
Basophils Absolute: 0 10*3/uL (ref 0.0–0.1)
Basophils Relative: 0 %
Eosinophils Absolute: 0.1 10*3/uL (ref 0.0–0.5)
Eosinophils Relative: 1 %
HCT: 40.9 % (ref 36.0–46.0)
Hemoglobin: 13.8 g/dL (ref 12.0–15.0)
Immature Granulocytes: 0 %
Lymphocytes Relative: 22 %
Lymphs Abs: 1.8 10*3/uL (ref 0.7–4.0)
MCH: 32.3 pg (ref 26.0–34.0)
MCHC: 33.7 g/dL (ref 30.0–36.0)
MCV: 95.8 fL (ref 80.0–100.0)
Monocytes Absolute: 0.8 10*3/uL (ref 0.1–1.0)
Monocytes Relative: 10 %
Neutro Abs: 5.4 10*3/uL (ref 1.7–7.7)
Neutrophils Relative %: 67 %
Platelet Count: 298 10*3/uL (ref 150–400)
RBC: 4.27 MIL/uL (ref 3.87–5.11)
RDW: 12.4 % (ref 11.5–15.5)
WBC Count: 8.1 10*3/uL (ref 4.0–10.5)
nRBC: 0 % (ref 0.0–0.2)

## 2023-06-18 LAB — RETIC PANEL
Immature Retic Fract: 4.7 % (ref 2.3–15.9)
RBC.: 4.26 MIL/uL (ref 3.87–5.11)
Retic Count, Absolute: 71.6 10*3/uL (ref 19.0–186.0)
Retic Ct Pct: 1.7 % (ref 0.4–3.1)
Reticulocyte Hemoglobin: 36 pg (ref >27.9)

## 2023-06-18 LAB — LACTATE DEHYDROGENASE: LDH: 166 U/L (ref 98–192)

## 2023-06-18 LAB — IRON AND IRON BINDING CAPACITY (CC-WL,HP ONLY)
Iron: 113 ug/dL (ref 28–170)
Saturation Ratios: 27 % (ref 10.4–31.8)
TIBC: 424 ug/dL (ref 250–450)
UIBC: 311 ug/dL (ref 148–442)

## 2023-06-18 LAB — FERRITIN: Ferritin: 88 ng/mL (ref 11–307)

## 2023-06-18 NOTE — Progress Notes (Signed)
 Hematology and Oncology Follow Up Visit  [name] [id number] 05-18-1950 73 y.o. 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 She is feeling fatigued and has had dizziness with falls. Iron studies are pending.  Her last phlebotomy was in 11/2022.  She is doing her best to eat well but admits that she needs to hydrate better throughout the day. She likes to drink mostly Coke.  Skin is thin and she does bruise easily on her arms and legs.  No syncope. No fever, chills, n/v, cough, chest pain, palpitations or changes in bladder habits.  She has GI upset and pain off and on.  No swelling in her extremities.  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   ECOG Performance Status: 1 - Symptomatic but completely ambulatory  Medications:  Allergies as of 06/18/2023       Reactions   Diphenhydramine Hives   Sulfa  Antibiotics Nausea And Vomiting   Sulfa  Antibiotics Nausea Only   Sulfur Nausea Only   Bactrim  [sulfamethoxazole -trimethoprim ] Nausea And Vomiting   Nsaids Other (See Comments)   She doesn't take Nsaids because it runs up her BP.   Sulfamethoxazole -trimethoprim  Nausea And Vomiting        Medication List        Accurate as of June 18, 2023  1:15 PM. If you have any questions, ask your nurse or doctor.          acetaminophen  325 MG tablet Commonly known as: TYLENOL  Take 325-650 mg by mouth every 6 (six) hours as needed for mild pain or headache.   ALPRAZolam  0.5 MG tablet Commonly known as: XANAX  Take 0.5 mg by mouth at bedtime.   amLODipine  10 MG tablet Commonly known as: NORVASC  Take 10 mg by mouth daily.   Ascorbic Acid 500 MG Caps Take by mouth.   Breo Ellipta  100-25 MCG/INH Aepb Generic drug: fluticasone  furoate-vilanterol INHALE ONE  PUFF ONCE A DAY   clopidogrel  75 MG tablet Commonly known as: PLAVIX  Take 1 tablet (75 mg total) by mouth daily.   clotrimazole -betamethasone  cream Commonly known as: LOTRISONE  Apply 1 application  topically 2 (two) times daily as needed (to affected area).   escitalopram  10 MG tablet Commonly known as: LEXAPRO  Take 10 mg by mouth daily. Patient is taking 5 MG daily   hydrochlorothiazide  12.5 MG tablet Commonly known as: HYDRODIURIL  Take 12.5 mg by mouth daily.   lansoprazole  30 MG capsule Commonly known as: PREVACID  Take 30 mg by mouth daily as needed (for ulcer/acid reflex). Ulcer/ acid reflux   metoprolol  succinate 100 MG 24 hr tablet Commonly known as: TOPROL -XL TAKE 1 & 1/2 (ONE & ONE-HALF) TABLETS BY MOUTH ONCE DAILY . Please make yearly appt with Dr. Felipe Horton for May for future refills. 1st attempt   nitroGLYCERIN  0.4 MG SL tablet Commonly known as: NITROSTAT    rosuvastatin  10 MG tablet Commonly known as: CRESTOR  Take 1 tablet (10 mg total) by mouth every other day.   telmisartan  20 MG tablet Commonly known as: MICARDIS  Take 1 tablet (20 mg total) by mouth daily.   telmisartan  40 MG tablet Commonly known as: MICARDIS  Take 40 mg by mouth daily.   Ventolin  HFA 108 (90 Base) MCG/ACT inhaler Generic drug: albuterol  Inhale into the lungs.   VITAMIN  C PO Take 2 tablets by mouth daily.   vitamin E  180 MG (400 UNITS) capsule Take 400 Units by mouth daily.        Allergies:  Allergies  Allergen Reactions   Diphenhydramine Hives   Sulfa  Antibiotics Nausea And Vomiting   Sulfa  Antibiotics Nausea Only   Sulfur Nausea Only   Bactrim  [Sulfamethoxazole -Trimethoprim ] Nausea And Vomiting   Nsaids Other (See Comments)    She doesn't take Nsaids because it runs up her BP.   Sulfamethoxazole -Trimethoprim  Nausea And Vomiting    Past Medical History, Surgical history, Social history, and Family History were reviewed and updated.  Review of Systems: All other 10  point review of systems is negative.   Physical Exam:  vitals were not taken for this visit.   Wt Readings from Last 3 Encounters:  11/13/22 111 lb 1.9 oz (50.4 kg)  06/27/22 114 lb (51.7 kg)  04/24/22 114 lb 12.8 oz (52.1 kg)    Ocular: Sclerae unicteric, pupils equal, round and reactive to light Ear-nose-throat: Oropharynx clear, dentition fair Lymphatic: No cervical or supraclavicular adenopathy Lungs no rales or rhonchi, good excursion bilaterally Heart regular rate and rhythm, no murmur appreciated Abd soft, nontender, positive bowel sounds MSK no focal spinal tenderness, no joint edema Neuro: non-focal, well-oriented, appropriate affect Breasts: Deferred   Lab Results  Component Value Date   WBC 6.2 11/13/2022   HGB 15.6 (H) 11/13/2022   HCT 46.5 (H) 11/13/2022   MCV 96.1 11/13/2022   PLT 255 11/13/2022   Lab Results  Component Value Date   FERRITIN 53 11/13/2022   IRON 88 11/13/2022   TIBC 455 (H) 11/13/2022   UIBC 367 11/13/2022   IRONPCTSAT 19 11/13/2022   Lab Results  Component Value Date   RETICCTPCT 1.2 08/03/2010   RBC 4.84 11/13/2022   RETICCTABS 61.3 08/03/2010   No results found for: "KPAFRELGTCHN", "LAMBDASER", "KAPLAMBRATIO" No results found for: "IGGSERUM", "IGA", "IGMSERUM" No results found for: "TOTALPROTELP", "ALBUMINELP", "A1GS", "A2GS", "BETS", "BETA2SER", "GAMS", "MSPIKE", "SPEI"   Chemistry      Component Value Date/Time   NA 133 (L) 11/13/2022 1135   NA 137 04/01/2019 1649   NA 139 01/03/2017 1327   NA 136 12/28/2015 1311   K 4.4 11/13/2022 1135   K 3.7 01/03/2017 1327   K 4.9 12/28/2015 1311   CL 93 (L) 11/13/2022 1135   CL 94 (L) 01/03/2017 1327   CO2 34 (H) 11/13/2022 1135   CO2 31 01/03/2017 1327   CO2 28 12/28/2015 1311   BUN 15 11/13/2022 1135   BUN 14 04/01/2019 1649   BUN 12 01/03/2017 1327   BUN 10.4 12/28/2015 1311   CREATININE 0.83 11/13/2022 1135   CREATININE 1.0 01/03/2017 1327   CREATININE 0.9 12/28/2015 1311       Component Value Date/Time   CALCIUM  9.5 11/13/2022 1135   CALCIUM  8.8 01/03/2017 1327   CALCIUM  9.6 12/28/2015 1311   ALKPHOS 69 11/13/2022 1135   ALKPHOS 85 (H) 01/03/2017 1327   ALKPHOS 83 12/28/2015 1311   AST 18 11/13/2022 1135   AST 25 12/28/2015 1311   ALT 10 11/13/2022 1135   ALT 19 01/03/2017 1327   ALT 13 12/28/2015 1311   BILITOT 0.7 11/13/2022 1135   BILITOT 0.74 12/28/2015 1311       Impression and Plan: [name] is a 73 yo caucasian female with polycythemia.  She is "triple negative".  She is symptomatic as mentioned above.  Hct is 40%. No phlebotomy needed.  Iron studies are pending. If low she will try taking an iron supplement 2 days a week.  Follow-up in 3 months.   Kennard Pea, NP 4/28/20251:15 PM

## 2023-06-19 DIAGNOSIS — Z01419 Encounter for gynecological examination (general) (routine) without abnormal findings: Secondary | ICD-10-CM | POA: Diagnosis not present

## 2023-06-19 DIAGNOSIS — Z1231 Encounter for screening mammogram for malignant neoplasm of breast: Secondary | ICD-10-CM | POA: Diagnosis not present

## 2023-06-19 DIAGNOSIS — Z779 Other contact with and (suspected) exposures hazardous to health: Secondary | ICD-10-CM | POA: Diagnosis not present

## 2023-06-19 DIAGNOSIS — Z01411 Encounter for gynecological examination (general) (routine) with abnormal findings: Secondary | ICD-10-CM | POA: Diagnosis not present

## 2023-06-19 DIAGNOSIS — Z124 Encounter for screening for malignant neoplasm of cervix: Secondary | ICD-10-CM | POA: Diagnosis not present

## 2023-06-20 ENCOUNTER — Other Ambulatory Visit: Payer: Self-pay | Admitting: Obstetrics and Gynecology

## 2023-06-20 ENCOUNTER — Encounter: Payer: Self-pay | Admitting: *Deleted

## 2023-06-20 ENCOUNTER — Encounter: Payer: Self-pay | Admitting: Physician Assistant

## 2023-06-20 ENCOUNTER — Telehealth: Payer: Self-pay

## 2023-06-20 DIAGNOSIS — M8580 Other specified disorders of bone density and structure, unspecified site: Secondary | ICD-10-CM

## 2023-06-20 NOTE — Telephone Encounter (Signed)
 Attempted phone call to pt and left message to contact [phone].

## 2023-06-20 NOTE — Telephone Encounter (Signed)
 Pt c/o BP issue: STAT if pt c/o blurred vision, one-sided weakness or slurred speech.  STAT if BP is GREATER than 180/120 TODAY.  STAT if BP is LESS than 90/60 and SYMPTOMATIC TODAY  1. What is your BP concern? Pt states her bp has been up and down   2. Have you taken any BP medication today?yes   3. What are your last 5 BP readings?  89/47 - yesterday day  140/78 - last night  Did not have bp reading today   4. Are you having any other symptoms (ex. Dizziness, headache, blurred vision, passed out)? Pt stats the left side of her body went numb yesterday minus her legs. She states she feels tired today but denies any other symptoms currently/today. Please advise.

## 2023-06-20 NOTE — Telephone Encounter (Signed)
 Patient was scheduled for 6/26 with Reginal Capra, PA.

## 2023-06-22 NOTE — Telephone Encounter (Signed)
 Left message for patient to callback. She is overdue for OV, needs to establish with new provider as Dr. Jacquelynn Matter is no longer with our practice. She may see APP. Advised to speak with triage nurse if she is still having symptoms or concerns about her BP that need to be addressed before she is seen in office.

## 2023-06-26 NOTE — Telephone Encounter (Signed)
 Called and spoke with patient who states that total episode lasted 15 minutes. She felt her face tingling and felt it in her arm and belly. She says that her BP was low and has been running low when she felt this way. She has since been to another provider (the cancer center) and had blood work done and is awaiting results for that. Scheduled her for overdue appt with DOD Katheryne Pane since she was prior [place] patient. Pt appreciative of call back. ED precautions given for any further neuro symptoms.

## 2023-07-02 ENCOUNTER — Other Ambulatory Visit: Payer: Self-pay | Admitting: Interventional Cardiology

## 2023-07-03 ENCOUNTER — Encounter: Payer: Self-pay | Admitting: Hematology & Oncology

## 2023-07-03 ENCOUNTER — Ambulatory Visit: Attending: Cardiovascular Disease | Admitting: Cardiovascular Disease

## 2023-07-03 ENCOUNTER — Encounter: Payer: Self-pay | Admitting: Cardiovascular Disease

## 2023-07-03 VITALS — BP 130/70 | HR 51 | Ht 60.0 in | Wt 113.0 lb

## 2023-07-03 DIAGNOSIS — Z720 Tobacco use: Secondary | ICD-10-CM

## 2023-07-03 DIAGNOSIS — I10 Essential (primary) hypertension: Secondary | ICD-10-CM | POA: Diagnosis not present

## 2023-07-03 DIAGNOSIS — I25119 Atherosclerotic heart disease of native coronary artery with unspecified angina pectoris: Secondary | ICD-10-CM | POA: Diagnosis not present

## 2023-07-03 DIAGNOSIS — E785 Hyperlipidemia, unspecified: Secondary | ICD-10-CM

## 2023-07-03 DIAGNOSIS — I6523 Occlusion and stenosis of bilateral carotid arteries: Secondary | ICD-10-CM | POA: Diagnosis not present

## 2023-07-03 NOTE — Patient Instructions (Addendum)
 Medication Instructions:  - START metoprolol  succinate (TOPROL -XL) 100 MG 24 hr tablet   *Please call back and verify which dose of the MICARDIS  you are taking so your medication list can be updated*   *If you need a refill on your cardiac medications before your next appointment, please call your pharmacy*   Lab Work: NONE    If you have labs (blood work) drawn today and your tests are completely normal, you will receive your results only by: MyChart Message (if you have MyChart) OR A paper copy in the mail If you have any lab test that is abnormal or we need to change your treatment, we will call you to review the results.   Testing/Procedures: Your physician has requested that you have a carotid duplex IN 1 YEAR BEFORE NEXT APPOINTMENT.  This test is an ultrasound of the carotid arteries in your neck. It looks at blood flow through these arteries that supply the brain with blood. Allow one hour for this exam. There are no restrictions or special instructions.    Follow-Up: At [facility], you and your health needs are our priority.  As part of our continuing mission to provide you with exceptional heart care, we have created designated Provider Care Teams.  These Care Teams include your primary Cardiologist (physician) and Advanced Practice Providers (APPs -  Physician Assistants and Nurse Practitioners) who all work together to provide you with the care you need, when you need it.  We recommend signing up for the patient portal called "MyChart".  Sign up information is provided on this After Visit Summary.  MyChart is used to connect with patients for Virtual Visits (Telemedicine).  Patients are able to view lab/test results, encounter notes, upcoming appointments, etc.  Non-urgent messages can be sent to your provider as well.   To learn more about what you can do with MyChart, go to [email or web address].    Your next appointment:   6 month(s)  The format for your next  appointment:   In Person  Provider:   One of our Advanced Practice Providers (APPs): Melita Springer, PA-C  Friddie Jetty, NP Evaline Hill, NP  Theotis Flake, PA-C Lawana Pray, NP  Willis Harter, PA-C Lovette Rud, PA-C  [place], PA-C Ernest Dick, NP  Marlana Silvan, NP Marcie Sever, PA-C  Laquita Plant, PA-C    Dayna Dunn, PA-C  Scott Weaver, PA-C Palmer Bobo, NP Katlyn West, NP Callie Goodrich, PA-C  Evan Williams, PA-C Sheng Haley, PA-C  Xika Zhao, NP Kathleen Johnson, PA-C   Then, Lauro Portal will plan to see you again in 1 year(s).   Other Instructions

## 2023-07-03 NOTE — Progress Notes (Signed)
 07/03/2023 [name]   1950/09/19  [id number]  Primary Physician Jearldine Mina, MD Primary Cardiologist: Avanell Leigh MD Bennye Bravo, [place]  HPI:  [name] is a 73 y.o. thin-appearing divorced Caucasian female mother of 2, grandmother of 5 grandchildren who is retired from being an Art therapist at [employer].  She was formally a patient of Dr. Ollie Bhat and then Dr. Jacquelynn Matter.  I am assuming her care in their absence.  She has a history of ongoing tobacco abuse of 1 pack/day for the last 40 years off and on.  She has treated hypertension hyperlipidemia.  She is never had a heart attack or stroke.  She had coronary artery stenting in 2010, bilateral carotid endarterectomy in 2007 and 2018 followed by her PCP.  She also has P vera followed by hematologist Dr. Maria Shiner.  She really is not very active.  She admits to being depressed and bays being dizzy occasionally.  1 week ago she had an episode which sounds like a TIA with numbness on the left side of her body.  She did check her blood pressure at the time it was 87/45.  Later that night her blood pressure had increased and her symptoms had resolved.   Current Meds  Medication Sig   acetaminophen  (TYLENOL ) 325 MG tablet Take 325-650 mg by mouth every 6 (six) hours as needed for mild pain or headache.   albuterol  (VENTOLIN  HFA) 108 (90 Base) MCG/ACT inhaler Inhale into the lungs.   ALPRAZolam  (XANAX ) 0.5 MG tablet Take 0.5 mg by mouth at bedtime.    amLODipine  (NORVASC ) 10 MG tablet Take 10 mg by mouth daily.   Ascorbic Acid (VITAMIN C PO) Take 2 tablets by mouth daily.   Ascorbic Acid 500 MG CAPS Take by mouth.   BREO ELLIPTA  100-25 MCG/INH AEPB INHALE ONE PUFF ONCE A DAY   clopidogrel  (PLAVIX ) 75 MG tablet Take 1 tablet by mouth once daily   clotrimazole -betamethasone  (LOTRISONE ) cream Apply 1 application  topically 2 (two) times daily as needed (to affected area).   escitalopram   (LEXAPRO ) 10 MG tablet Take 10 mg by mouth daily. Patient is taking 5 MG daily   hydrochlorothiazide  (HYDRODIURIL ) 12.5 MG tablet Take 12.5 mg by mouth daily.   lansoprazole  (PREVACID ) 30 MG capsule Take 30 mg by mouth daily as needed (for ulcer/acid reflex). Ulcer/ acid reflux   metoprolol  succinate (TOPROL -XL) 100 MG 24 hr tablet TAKE 1 & 1/2 (ONE & ONE-HALF) TABLETS BY MOUTH ONCE DAILY . Please make yearly appt with Dr. Felipe Horton for May for future refills. 1st attempt   nitroGLYCERIN  (NITROSTAT ) 0.4 MG SL tablet    rosuvastatin  (CRESTOR ) 10 MG tablet Take 1 tablet (10 mg total) by mouth every other day.   telmisartan  (MICARDIS ) 20 MG tablet Take 1 tablet (20 mg total) by mouth daily.   telmisartan  (MICARDIS ) 40 MG tablet Take 40 mg by mouth daily.   vitamin E  180 MG (400 UNITS) capsule Take 400 Units by mouth daily.     Allergies  Allergen Reactions   Diphenhydramine Hives   Sulfa  Antibiotics Nausea And Vomiting   Sulfa  Antibiotics Nausea Only   Sulfur Nausea Only   Bactrim  [Sulfamethoxazole -Trimethoprim ] Nausea And Vomiting   Nsaids Other (See Comments)    She doesn't take Nsaids because it runs up her BP.   Sulfamethoxazole -Trimethoprim  Nausea And Vomiting    Social History   Socioeconomic History   Marital status: Single    Spouse name:  Not on file   Number of children: Not on file   Years of education: Not on file   Highest education level: Not on file  Occupational History   Not on file  Tobacco Use   Smoking status: Every Day    Current packs/day: 1.00    Types: Cigarettes    Passive exposure: Never   Smokeless tobacco: Never  Vaping Use   Vaping status: Some Days   Start date: 08/07/2020  Substance and Sexual Activity   Alcohol use: Not Currently    Comment: socially occasionally   Drug use: Not Currently    Comment: has smoked cigarettes off and on   Sexual activity: Not Currently  Other Topics Concern   Not on file  Social History Narrative   ** Merged  History Encounter **       ** Merged History Encounter **       Tobacco use cigarettes: Current smoker Frequency 1 PPD Estimated Pack - years :30 Smoking : yes  No Tobacco exposure No alcohol No exercise Occupation : employed, works at home for [employer], Engineer, maintenance (IT)   n Boys 1 Girls 1   Social Drivers of Corporate investment banker Strain: Not on file  Food Insecurity: No Food Insecurity (07/17/2019)   Hunger Vital Sign    Worried About Running Out of Food in the Last Year: Never true    Ran Out of Food in the Last Year: Never true  Transportation Needs: Not on file  Physical Activity: Not on file  Stress: Not on file  Social Connections: Not on file  Intimate Partner Violence: Not on file     Review of Systems: General: negative for chills, fever, night sweats or weight changes.  Cardiovascular: negative for chest pain, dyspnea on exertion, edema, orthopnea, palpitations, paroxysmal nocturnal dyspnea or shortness of breath Dermatological: negative for rash Respiratory: negative for cough or wheezing Urologic: negative for hematuria Abdominal: negative for nausea, vomiting, diarrhea, bright red blood per rectum, melena, or hematemesis Neurologic: negative for visual changes, syncope, or dizziness All other systems reviewed and are otherwise negative except as noted above.    Blood pressure 130/70, pulse (!) 51, height 5' (1.524 m), weight 113 lb (51.3 kg), SpO2 97%.  General appearance: alert and no distress Neck: no adenopathy, no carotid bruit, no JVD, supple, symmetrical, trachea midline, and thyroid  not enlarged, symmetric, no tenderness/mass/nodules Lungs: clear to auscultation bilaterally Heart: regular rate and rhythm, S1, S2 normal, no murmur, click, rub or gallop Extremities: extremities normal, atraumatic, no cyanosis or edema Pulses: 2+ and symmetric Skin: Skin color, texture, turgor normal. No rashes or  lesions Neurologic: Grossly normal  EKG EKG Interpretation Date/Time:  Tuesday Jul 03 2023 14:19:29 EDT Ventricular Rate:  51 PR Interval:  194 QRS Duration:  82 QT Interval:  438 QTC Calculation: 403 R Axis:   73  Text Interpretation: Sinus bradycardia When compared with ECG of 24-Apr-2021 11:46, PREVIOUS ECG IS PRESENT Confirmed by Lauro Portal [phone]) on 07/03/2023 2:28:11 PM    ASSESSMENT AND PLAN:   Hyperlipidemia LDL goal <70 History of hyperlipidemia on statin therapy with lipid profile performed 08/21/2022 revealing a total cholesterol of 147, LDL of 68 and HDL 40.  Essential hypertension She of essential hypertension with blood pressure measured today 130/70.  She is on metoprolol , hydrochlorothiazide , and Micardis .  She is somewhat bradycardic today with heart rate of 51.  I am going to decrease her metoprolol  from 150 to  100 mg a day.  Coronary artery disease involving native coronary artery of native heart with angina pectoris (HCC) History of CAD status post coronary artery stenting x 2 in 2010.  She denies chest pain or shortness of breath.  Tobacco abuse Ongoing tobacco use of 1 pack/day for the last 30 to 40 years although she has stopped for short periods of time in the past.  She does have a diagnosis of COPD.  Carotid artery stenosis History of bilateral carotid endarterectomy in 2007 and 2018.  Apparently recent carotid Dopplers performed by her PCP reviewed revealed these to be widely patent.     Avanell Leigh MD FACP,FACC,FAHA, [facility] 07/03/2023 2:42 PM

## 2023-07-03 NOTE — Assessment & Plan Note (Signed)
 She of essential hypertension with blood pressure measured today 130/70.  She is on metoprolol , hydrochlorothiazide , and Micardis .  She is somewhat bradycardic today with heart rate of 51.  I am going to decrease her metoprolol  from 150 to 100 mg a day.

## 2023-07-03 NOTE — Assessment & Plan Note (Signed)
 Ongoing tobacco use of 1 pack/day for the last 30 to 40 years although she has stopped for short periods of time in the past.  She does have a diagnosis of COPD.

## 2023-07-03 NOTE — Assessment & Plan Note (Signed)
 History of hyperlipidemia on statin therapy with lipid profile performed 08/21/2022 revealing a total cholesterol of 147, LDL of 68 and HDL 40.

## 2023-07-03 NOTE — Assessment & Plan Note (Signed)
 History of bilateral carotid endarterectomy in 2007 and 2018.  Apparently recent carotid Dopplers performed by her PCP reviewed revealed these to be widely patent.

## 2023-07-03 NOTE — Assessment & Plan Note (Signed)
 History of CAD status post coronary artery stenting x 2 in 2010.  She denies chest pain or shortness of breath.

## 2023-07-06 ENCOUNTER — Telehealth: Payer: Self-pay | Admitting: Cardiovascular Disease

## 2023-07-06 DIAGNOSIS — I10 Essential (primary) hypertension: Secondary | ICD-10-CM

## 2023-07-06 MED ORDER — TELMISARTAN 80 MG PO TABS: 80.0000 mg | ORAL_TABLET | Freq: Every day | ORAL | 3 refills | Status: DC

## 2023-07-06 NOTE — Telephone Encounter (Signed)
 Spoke with patient and she states she is taking 80 mg of telmisartan . She would like to know if you want her to continue with 80 mg or decrease to 40 mg. She will also need a refill on her telmisartan 

## 2023-07-06 NOTE — Telephone Encounter (Signed)
 Pt c/o medication issue:  1. Name of Medication: Telmisartan    2. How are you currently taking this medication (dosage and times per day)?    3. Are you having a reaction (difficulty breathing--STAT)? no  4. What is your medication issue? Patient calling back to say that she is taking 80mg  of medication. Please advise

## 2023-07-06 NOTE — Telephone Encounter (Addendum)
Rx sent to pharmacy.  Patient is aware. 

## 2023-07-23 ENCOUNTER — Other Ambulatory Visit: Payer: Self-pay | Admitting: Interventional Cardiology

## 2023-08-04 ENCOUNTER — Other Ambulatory Visit: Payer: Self-pay | Admitting: Interventional Cardiology

## 2023-08-16 ENCOUNTER — Encounter: Payer: Self-pay | Admitting: Physician Assistant

## 2023-08-16 ENCOUNTER — Ambulatory Visit: Admitting: Physician Assistant

## 2023-08-16 VITALS — BP 122/68 | HR 78 | Ht 60.0 in | Wt 114.0 lb

## 2023-08-16 DIAGNOSIS — Z7901 Long term (current) use of anticoagulants: Secondary | ICD-10-CM | POA: Diagnosis not present

## 2023-08-16 DIAGNOSIS — K219 Gastro-esophageal reflux disease without esophagitis: Secondary | ICD-10-CM

## 2023-08-16 DIAGNOSIS — Z860100 Personal history of colon polyps, unspecified: Secondary | ICD-10-CM

## 2023-08-16 DIAGNOSIS — R1310 Dysphagia, unspecified: Secondary | ICD-10-CM | POA: Diagnosis not present

## 2023-08-16 DIAGNOSIS — R630 Anorexia: Secondary | ICD-10-CM

## 2023-08-16 DIAGNOSIS — Z860101 Personal history of adenomatous and serrated colon polyps: Secondary | ICD-10-CM

## 2023-08-16 DIAGNOSIS — R634 Abnormal weight loss: Secondary | ICD-10-CM | POA: Diagnosis not present

## 2023-08-16 MED ORDER — NA SULFATE-K SULFATE-MG SULF 17.5-3.13-1.6 GM/177ML PO SOLN: 1.0000 | Freq: Once | ORAL | 0 refills | Status: AC

## 2023-08-16 NOTE — Progress Notes (Signed)
 Chief Complaint: Discuss EGD and colonoscopy  HPI:    [name] is a 73 year old female, referred to Dr. Wilhelmenia, with a past medical history as listed below including CAD on Plavix  (04/14/2019 echo with LVEF 60-65%), CHF, COPD, GERD, polycythemia vera and multiple others, who was referred to me by Ransom Other, MD for discussion of an EGD and colonoscopy.    2016 colonoscopy with multiple adenomas and history of prior adenomas with repeat recommended in 5 years.  Patient was told that she could have a direct colonoscopy with Dr. Wilhelmenia.    03/22/2017 flex sigmoidoscopy at [facility] with Dr. Rollin with diffuse severe inflammation in the sigmoid colon secondary to left-sided colitis and diverticulosis.  This was suspected to be ischemic.    Today, patient presents to clinic and tells me that she is due for colonoscopy.  She is requesting Dr. Wilhelmenia.  She also explains that she would like to have an EGD done as she is having some upper GI complaints.  She describes a decrease in appetite over the past year and losing about 10 pounds without really trying over the past year.  Also chronic reflux for which she uses Aciphex as needed.  Also just recently over the past month or so has had some trouble swallowing at times.  She just wants to make sure nothing is wrong.    Denies fever, chills, nausea or vomiting.  Past Medical History:  Diagnosis Date   Anxiety    Arthritis    CAD (coronary artery disease)    a. s/p LCX stent 2010 with residual disease.   Carotid artery disease (HCC)    CHF (congestive heart failure) (HCC)    COPD (chronic obstructive pulmonary disease) (HCC)    Depression    GERD (gastroesophageal reflux disease)    Hepatitis C    History of carotid endarterectomy    Hypertension    Noncompliance with medication regimen    Polycythemia    Polycythemia vera (HCC)    PVD (peripheral vascular disease) (HCC)    Seizures (HCC) 1970   x1- pt. reports that  there was never any causative agent      Past Surgical History:  Procedure Laterality Date   2 stints  Aug 26,2010   Dr. Nanda   ABDOMINAL SURGERY  [phone]   for excessive bleeding from loss of pregnancy, required blood transfusion post procedure    CAROTID ENDARTERECTOMY  2007   cartoid endartherectomy  2007   CORONARY ANGIOPLASTY WITH STENT PLACEMENT  2010   CORONARY ANGIOPLASTY WITH STENT PLACEMENT  10/09/2008   ENDARTERECTOMY Left 07/24/2016   Procedure: ENDARTERECTOMY CAROTID;  Surgeon: Oris Krystal FALCON, MD;  Location: [facility] OR;  Service: Vascular;  Laterality: Left;   FLEXIBLE SIGMOIDOSCOPY N/A 03/22/2017   Procedure: ENID MORIN;  Surgeon: Rollin Dover, MD;  Location: [facility] ENDOSCOPY;  Service: Endoscopy;  Laterality: N/A;   LYMPH NODE DISSECTION Left 1990's   benign    ORIF ELBOW FRACTURE Right 04/26/2018   Procedure: OPEN REDUCTION INTERNAL FIXATION (ORIF) ELBOW/OLECRANON FRACTURE;  Surgeon: Kendal Franky SQUIBB, MD;  Location: [facility] OR;  Service: Orthopedics;  Laterality: Right;   TUBAL LIGATION      Current Outpatient Medications  Medication Sig Dispense Refill   acetaminophen  (TYLENOL ) 325 MG tablet Take 325-650 mg by mouth every 6 (six) hours as needed for mild pain or headache.     albuterol  (VENTOLIN  HFA) 108 (90 Base) MCG/ACT inhaler Inhale into the lungs.  ALPRAZolam  (XANAX ) 0.5 MG tablet Take 0.5 mg by mouth at bedtime.      amLODipine  (NORVASC ) 10 MG tablet Take 10 mg by mouth daily.     Ascorbic Acid (VITAMIN C PO) Take 2 tablets by mouth daily.     Ascorbic Acid 500 MG CAPS Take by mouth.     BREO ELLIPTA  100-25 MCG/INH AEPB INHALE ONE PUFF ONCE A DAY     clopidogrel  (PLAVIX ) 75 MG tablet Take 1 tablet by mouth once daily 90 tablet 3   clotrimazole -betamethasone  (LOTRISONE ) cream Apply 1 application  topically 2 (two) times daily as needed (to affected area).     escitalopram  (LEXAPRO ) 10 MG tablet Take 10 mg by mouth daily. Patient is taking 5 MG daily      hydrochlorothiazide  (HYDRODIURIL ) 12.5 MG tablet Take 12.5 mg by mouth daily.     lansoprazole  (PREVACID ) 30 MG capsule Take 30 mg by mouth daily as needed (for ulcer/acid reflex). Ulcer/ acid reflux     metoprolol  succinate (TOPROL -XL) 100 MG 24 hr tablet TAKE 1 & 1/2 (ONE & ONE-HALF) TABLETS BY MOUTH ONCE DAILY . Please make yearly appt with Dr. Claudene for May for future refills. 1st attempt 135 tablet 1   nitroGLYCERIN  (NITROSTAT ) 0.4 MG SL tablet      rosuvastatin  (CRESTOR ) 10 MG tablet Take 1 tablet (10 mg total) by mouth every other day. 90 tablet 3   telmisartan  (MICARDIS ) 80 MG tablet Take 1 tablet (80 mg total) by mouth daily. 90 tablet 3   vitamin E  180 MG (400 UNITS) capsule Take 400 Units by mouth daily.     No current facility-administered medications for this visit.    Allergies as of 08/16/2023 - Review Complete 07/03/2023  Allergen Reaction Noted   Diphenhydramine Hives 04/25/2018   Sulfa  antibiotics Nausea And Vomiting 04/25/2018   Sulfa  antibiotics Nausea Only 11/04/2020   Sulfur Nausea Only 12/13/2020   Bactrim  [sulfamethoxazole -trimethoprim ] Nausea And Vomiting 03/20/2013   Nsaids Other (See Comments) 12/13/2020   Sulfamethoxazole -trimethoprim  Nausea And Vomiting 03/20/2013    Family History  Problem Relation Age of Onset   Heart disease Mother    Heart disease Father     Social History   Socioeconomic History   Marital status: Single    Spouse name: Not on file   Number of children: Not on file   Years of education: Not on file   Highest education level: Not on file  Occupational History   Not on file  Tobacco Use   Smoking status: Every Day    Current packs/day: 1.00    Types: Cigarettes    Passive exposure: Never   Smokeless tobacco: Never  Vaping Use   Vaping status: Some Days   Start date: 08/07/2020  Substance and Sexual Activity   Alcohol use: Not Currently    Comment: socially occasionally   Drug use: Not Currently    Comment: has smoked  cigarettes off and on   Sexual activity: Not Currently  Other Topics Concern   Not on file  Social History Narrative   ** Merged History Encounter **       ** Merged History Encounter **       Tobacco use cigarettes: Current smoker Frequency 1 PPD Estimated Pack - years :30 Smoking : yes  No Tobacco exposure No alcohol No exercise Occupation : employed, works at home for [employer], Engineer, maintenance (IT)   n Boys 1 Girls 1   Social Drivers of Health  Financial Resource Strain: Not on file  Food Insecurity: No Food Insecurity (07/17/2019)   Hunger Vital Sign    Worried About Running Out of Food in the Last Year: Never true    Ran Out of Food in the Last Year: Never true  Transportation Needs: Not on file  Physical Activity: Not on file  Stress: Not on file  Social Connections: Not on file  Intimate Partner Violence: Not on file    Review of Systems:    Constitutional: No weight loss, fever or chills Skin: No rash Cardiovascular: No chest pain Respiratory: No SOB  Gastrointestinal: See HPI and otherwise negative Genitourinary: No dysuria  Neurological: No headache, dizziness or syncope Musculoskeletal: No new muscle or joint pain Hematologic: No bleeding  Psychiatric: No history of depression or anxiety   Physical Exam:  Vital signs: BP 122/68   Pulse 78   Ht 5' (1.524 m)   Wt 114 lb (51.7 kg)   BMI 22.26 kg/m    Constitutional:   Pleasant elderly Caucasian female appears to be in NAD, Well developed, Well nourished, alert and cooperative Head:  Normocephalic and atraumatic. Eyes:   PEERL, EOMI. No icterus. Conjunctiva pink. Ears:  Normal auditory acuity. Neck:  Supple Throat: Oral cavity and pharynx without inflammation, swelling or lesion.  Respiratory: Respirations even and unlabored. Lungs clear to auscultation bilaterally.   No wheezes, crackles, or rhonchi.  Cardiovascular: Normal S1, S2. No MRG. Regular rate and  rhythm. No peripheral edema, cyanosis or pallor.  Gastrointestinal:  Soft, nondistended, mild epigastric ttp, No rebound or guarding. Normal bowel sounds. No appreciable masses or hepatomegaly. Rectal:  Not performed.  Msk:  Symmetrical without gross deformities. Without edema, no deformity or joint abnormality.  Neurologic:  Alert and  oriented x4;  grossly normal neurologically.  Skin:   Dry and intact without significant lesions or rashes. Psychiatric: Demonstrates good judgement and reason without abnormal affect or behaviors.  RELEVANT LABS AND IMAGING: CBC    Component Value Date/Time   WBC 8.1 06/18/2023 1304   WBC 6.6 04/24/2021 1143   RBC 4.27 06/18/2023 1304   RBC 4.26 06/18/2023 1304   HGB 13.8 06/18/2023 1304   HGB 14.5 04/01/2019 1649   HGB 14.1 01/03/2017 1327   HCT 40.9 06/18/2023 1304   HCT 41.7 04/01/2019 1649   HCT 42.9 01/03/2017 1327   PLT 298 06/18/2023 1304   PLT 234 04/01/2019 1649   MCV 95.8 06/18/2023 1304   MCV 93 04/01/2019 1649   MCV 87 01/03/2017 1327   MCH 32.3 06/18/2023 1304   MCHC 33.7 06/18/2023 1304   RDW 12.4 06/18/2023 1304   RDW 12.4 04/01/2019 1649   RDW 15.3 01/03/2017 1327   LYMPHSABS 1.8 06/18/2023 1304   LYMPHSABS 2.3 01/03/2017 1327   MONOABS 0.8 06/18/2023 1304   EOSABS 0.1 06/18/2023 1304   EOSABS 0.1 01/03/2017 1327   BASOSABS 0.0 06/18/2023 1304   BASOSABS 0.0 01/03/2017 1327    CMP     Component Value Date/Time   NA 131 (L) 06/18/2023 1304   NA 137 04/01/2019 1649   NA 139 01/03/2017 1327   NA 136 12/28/2015 1311   K 4.0 06/18/2023 1304   K 3.7 01/03/2017 1327   K 4.9 12/28/2015 1311   CL 90 (L) 06/18/2023 1304   CL 94 (L) 01/03/2017 1327   CO2 29 06/18/2023 1304   CO2 31 01/03/2017 1327   CO2 28 12/28/2015 1311   GLUCOSE 151 (H) 06/18/2023 1304  GLUCOSE 124 (H) 01/03/2017 1327   BUN 15 06/18/2023 1304   BUN 14 04/01/2019 1649   BUN 12 01/03/2017 1327   BUN 10.4 12/28/2015 1311   CREATININE 0.87 06/18/2023  1304   CREATININE 1.0 01/03/2017 1327   CREATININE 0.9 12/28/2015 1311   CALCIUM  9.9 06/18/2023 1304   CALCIUM  8.8 01/03/2017 1327   CALCIUM  9.6 12/28/2015 1311   PROT 7.4 06/18/2023 1304   PROT 6.8 01/03/2017 1327   PROT 7.2 12/28/2015 1311   ALBUMIN 4.5 06/18/2023 1304   ALBUMIN 3.7 01/03/2017 1327   ALBUMIN 4.2 09/07/2016 1424   ALBUMIN 3.7 12/28/2015 1311   AST 18 06/18/2023 1304   AST 25 12/28/2015 1311   ALT 9 06/18/2023 1304   ALT 19 01/03/2017 1327   ALT 13 12/28/2015 1311   ALKPHOS 64 06/18/2023 1304   ALKPHOS 85 (H) 01/03/2017 1327   ALKPHOS 83 12/28/2015 1311   BILITOT 0.6 06/18/2023 1304   BILITOT 0.74 12/28/2015 1311   GFRNONAA >60 06/18/2023 1304   GFRAA >60 08/07/2019 1338    Assessment: 1.  Decreased appetite/dysphagia/GERD: Currently on as needed Lansoprazole  but describes some difficulty swallowing over the past couple months with a decrease in appetite 10 pound weight loss over the past year; most likely chronic GERD +/- esophagitis +/- stricture 2.  History of polyps: Repeat colonoscopy overdue, last colonoscopy in 2016  Plan: 1.  Scheduled patient for diagnostic EGD with possible dilation and surveillance colonoscopy in the [facility].  Did provide the patient a detailed list of risks for the procedures and she agrees to proceed. Patient is appropriate for endoscopic procedure(s) in the ambulatory ([facility]) setting.  2.  Patient was advised to hold her Plavix  for 5 days prior to time of procedure.  We will communicate with her prescribing physician to ensure this is acceptable for her. 3.  Continue Aciphex as needed for now 4.  Patient to follow in clinic per recommendations after time of procedures.  Delon Failing, PA-C [facility] 08/16/2023, 2:07 PM  Cc: Husain, Karrar, MD

## 2023-08-16 NOTE — Progress Notes (Signed)
 Attending Physician's Attestation   I have reviewed the chart.   I agree with the Advanced Practitioner's note, impression, and recommendations with any updates as below.    Corliss Parish, MD [facility] Advanced Endoscopy Office # [phone]

## 2023-08-16 NOTE — Patient Instructions (Signed)
 You have been scheduled for an endoscopy and colonoscopy. Please follow the written instructions given to you at your visit today.  If you use inhalers (even only as needed), please bring them with you on the day of your procedure.  DO NOT TAKE 7 DAYS PRIOR TO TEST- Trulicity (dulaglutide) Ozempic, Wegovy (semaglutide) Mounjaro (tirzepatide) Bydureon Bcise (exanatide extended release)  DO NOT TAKE 1 DAY PRIOR TO YOUR TEST Rybelsus (semaglutide) Adlyxin (lixisenatide) Victoza (liraglutide) Byetta (exanatide) ________________________________________________________________________

## 2023-08-20 ENCOUNTER — Encounter: Payer: Self-pay | Admitting: Internal Medicine

## 2023-08-20 ENCOUNTER — Ambulatory Visit: Admitting: Internal Medicine

## 2023-08-20 VITALS — BP 128/77 | HR 59 | Ht 60.0 in | Wt 113.2 lb

## 2023-08-20 DIAGNOSIS — A310 Pulmonary mycobacterial infection: Secondary | ICD-10-CM | POA: Diagnosis not present

## 2023-08-20 DIAGNOSIS — F17200 Nicotine dependence, unspecified, uncomplicated: Secondary | ICD-10-CM

## 2023-08-20 DIAGNOSIS — J449 Chronic obstructive pulmonary disease, unspecified: Secondary | ICD-10-CM | POA: Diagnosis not present

## 2023-08-20 DIAGNOSIS — D45 Polycythemia vera: Secondary | ICD-10-CM | POA: Diagnosis not present

## 2023-08-20 DIAGNOSIS — R9389 Abnormal findings on diagnostic imaging of other specified body structures: Secondary | ICD-10-CM | POA: Diagnosis not present

## 2023-08-20 DIAGNOSIS — R0602 Shortness of breath: Secondary | ICD-10-CM

## 2023-08-20 DIAGNOSIS — F17210 Nicotine dependence, cigarettes, uncomplicated: Secondary | ICD-10-CM | POA: Diagnosis not present

## 2023-08-20 NOTE — Patient Instructions (Addendum)
 It was a pleasure to see you today!  Please schedule follow up with myself in 3 months.  If my schedule is not open yet, we will contact you with a reminder closer to that time. Please call [phone] if you haven't heard from us  a month before, and always call us  sooner if issues or concerns arise. You can also send us  a message through MyChart, but but aware that this is not to be used for urgent issues and it may take up to 5-7 days to receive a reply. Please be aware that you will likely be able to view your results before I have a chance to respond to them. Please give us  5 business days to respond to any non-urgent results.    Before your next visit I would like you to have: Full set of PFTs CT Chest  VISIT SUMMARY:  Today, you were seen for breathing difficulties and weight loss. We discussed your history of COPD, coronary artery disease, and other health concerns. We reviewed your symptoms, including shortness of breath, dry cough, and poor appetite. We also talked about your smoking and vaping habits.  YOUR PLAN:  -CHRONIC OBSTRUCTIVE PULMONARY DISEASE (COPD): COPD is a chronic lung disease that makes it hard to breathe. We will order pulmonary function tests to assess your lung function and discuss inhaler options that won't affect your blood pressure.  -NONTUBERCULOUS MYCOBACTERIAL LUNG DISEASE: This is a lung infection caused by bacteria found in soil and water. We suspect this based on your CT scan and symptoms like chronic cough and weight loss. We will monitor your symptoms and order a repeat CT scan of your chest.

## 2023-08-20 NOTE — Progress Notes (Signed)
 [name]    [id number]    1951-02-04  Primary Care Physician:Husain, Ardell, MD  Referring Physician: Ransom Ardell, MD [address] [place],  [place] [address] Reason for Consultation: Abnormal CT Chest, MAI infection Date of Consultation: 08/20/2023  Chief complaint:   Chief Complaint  Patient presents with   Consult    Referral to see a pulmonologist.     HPI:  Discussed the use of AI scribe software for clinical note transcription with the patient, who gave verbal consent to proceed.  History of Present Illness [name] is a 73 year old female with COPD and coronary artery disease who presents with breathing difficulties and weight loss. She was referred by her primary doctor to see a pulmonary specialist.  She experiences shortness of breath primarily with exercise, which has been ongoing for years. This affects her ability to perform yard work but not housework. She also has a dry cough at times, which can produce mucus. No hemoptysis. She uses albuterol  infrequently, typically less than once a day, and primarily at night before bed.  She has a significant smoking history, having started at age 69. Although she quit smoking cigarettes in January of this year, she switched to vaping. She resumed smoking cigarettes occasionally due to social influences, currently smoking about seven to eight cigarettes a day along with vaping. At her heaviest, she smoked a pack and a half per day.  She reports a poor appetite and ongoing weight loss without a clear reason. No fevers, chills, or significant night sweats, though she sometimes wakes up feeling hot. She has a history of bronchitis and pneumonia but has not had a cold in over a year. She also had the H1N1 virus in the past.  Her past medical history includes coronary artery disease with two stents placed in 2010 and polycythemia vera. She takes Plavix  and has experienced  issues with bruising and blood pressure fluctuations. She mentions a history of a blood disease at age [age], described as a form of leukemia.  She lives alone and is retired from office work, having previously worked for [employer]. She reports feeling tired frequently, which impacts her daily activities, though she can perform tasks like grocery shopping without shortness of breath. She uses Xanax  to aid sleep, as she often wakes up during the night.   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  Exposures: lives at home independently Smoking history:  Social History   Occupational History   Not on file  Tobacco Use   Smoking status: Some Days    Current packs/day: 0.50    Average packs/day: 1.5 packs/day for 57.5 years (85.7 ttl pk-yrs)    Types: Cigarettes, E-cigarettes    Start date: 1968    Passive exposure: Never   Smokeless tobacco: Never   Tobacco comments:    1 pack last 3 or 4 days  Vaping Use   Vaping status: Some Days   Start date: 08/07/2020  Substance and Sexual Activity   Alcohol use: Not Currently    Comment: socially occasionally   Drug use: Not Currently    Comment: has smoked cigarettes off and on   Sexual activity: Not Currently    Relevant family history:  Family History  Problem Relation Age of Onset   Heart disease Mother    Heart disease Father    Liver disease Neg  Hx    Esophageal cancer Neg Hx    Colon cancer Neg Hx     Past Medical History:  Diagnosis Date   Anxiety    Arthritis    CAD (coronary artery disease)    a. s/p LCX stent 2010 with residual disease.   Carotid artery disease (HCC)    CHF (congestive heart failure) (HCC)    COPD (chronic obstructive pulmonary disease) (HCC)    Depression    GERD (gastroesophageal reflux disease)    Hepatitis C    History of carotid endarterectomy    Hypertension    Noncompliance with medication regimen    Polycythemia     Polycythemia vera (HCC)    PVD (peripheral vascular disease) (HCC)    Seizures (HCC) 1970   x1- pt. reports that there was never any causative agent     UC (ulcerative colitis) [facility])     Past Surgical History:  Procedure Laterality Date   2 stints  Aug 26,2010   Dr. Nanda   ABDOMINAL SURGERY  [phone]   for excessive bleeding from loss of pregnancy, required blood transfusion post procedure    CAROTID ENDARTERECTOMY  2007   cartoid endartherectomy  2007   CORONARY ANGIOPLASTY WITH STENT PLACEMENT  2010   CORONARY ANGIOPLASTY WITH STENT PLACEMENT  10/09/2008   ENDARTERECTOMY Left 07/24/2016   Procedure: ENDARTERECTOMY CAROTID;  Surgeon: Oris Krystal FALCON, MD;  Location[facility] OR;  Service: Vascular;  Laterality: Left;   FLEXIBLE SIGMOIDOSCOPY N/A 03/22/2017   Procedure: ENID MORIN;  Surgeon: Rollin Dover, MD;  Location: [facility] ENDOSCOPY;  Service: Endoscopy;  Laterality: N/A;   LYMPH NODE DISSECTION Left 1990's   benign    ORIF ELBOW FRACTURE Right 04/26/2018   Procedure: OPEN REDUCTION INTERNAL FIXATION (ORIF) ELBOW/OLECRANON FRACTURE;  Surgeon: Kendal Franky SQUIBB, MD;  Location: [facility] OR;  Service: Orthopedics;  Laterality: Right;   TUBAL LIGATION       Physical Exam: Blood pressure 128/77, pulse (!) 59, height 5' (1.524 m), weight 113 lb 3.2 oz (51.3 kg), SpO2 96%. Gen:      No acute distress, hoarse voice ENT:  no nasal polyps, mucus membranes moist Lungs:    diminished, no wheezes or crackles CV:         Regular rate and rhythm; no murmurs, rubs, or gallops.  No pedal edema Abd:      + bowel sounds; soft, non-tender; no distension MSK: no acute synovitis of DIP or PIP joints, no mechanics hands.  Skin:      Warm and dry; no rashes Neuro: normal speech, no focal facial asymmetry Psych: alert and oriented x3, normal mood and affect   Data Reviewed/Medical Decision Making:  Independent interpretation of tests: Imaging:  Review of patient's CT chest images revealed mild bibasilar  subpleural reticulation, diffuse tree in bud nodules c/w mai colonization. The patient's images have been independently reviewed by me.    PFTs: I have personally reviewed the patient's PFTs and      No data to display          Labs:  Lab Results  Component Value Date   NA 131 (L) 06/18/2023   K 4.0 06/18/2023   CO2 29 06/18/2023   GLUCOSE 151 (H) 06/18/2023   BUN 15 06/18/2023   CREATININE 0.87 06/18/2023   CALCIUM  9.9 06/18/2023   GFR 67.06 06/23/2010   EGFR 70 (L) 12/28/2015   GFRNONAA >60 06/18/2023   Lab Results  Component Value Date   WBC 8.1  06/18/2023   HGB 13.8 06/18/2023   HCT 40.9 06/18/2023   MCV 95.8 06/18/2023   PLT 298 06/18/2023     Immunization status:  Immunization History  Administered Date(s) Administered   Influenza Split 04/01/2012   Influenza Whole 01/06/2009   Influenza,inj,Quad PF,6+ Mos 01/05/2014   Influenza-Unspecified 11/27/2015   PFIZER(Purple Top)SARS-COV-2 Vaccination 03/27/2019, 04/21/2019     I reviewed prior external note(s) from   I reviewed the result(s) of the labs and imaging as noted above.   I have ordered ct chest, pft   Assessment and Plan Assessment & Plan Chronic Obstructive Pulmonary Disease (COPD) COPD with exertional dyspnea, occasional albuterol  use, and concerns about inhalers affecting hypertension. Insurance does not cover inhalers. - Order pulmonary function tests. 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 Symptoms include chronic cough and weight loss. Current plan is monitoring as disease may not be active. - Order repeat CT scan of the chest. - Monitor symptoms and consider treatment if disease becomes active.  Polycythemia Vera Polycythemia vera with symptoms of bruising and red spots.  Tobacco use disorder Continues to smoke 7-8 cigarettes daily and uses vaping products. -  precontemplative in quitting smoking   Smoking Cessation Counseling:  1. The patient is an everyday smoker and symptomatic due to the following condition copd 2. The patient is currentlypre-contemplative in quitting smoking. 3. I advised patient to quit smoking. 4. We identified patient specific barriers to change.  5. I personally spent 3 minutes counseling the patient regarding tobacco use disorder. 6. We discussed management of stress and anxiety to help with smoking cessation, when applicable. 7. We discussed nicotine  replacement therapy, Wellbutrin, Chantix as possible options. 8. I advised setting a quit date. 9. Follow?up arranged with our office to continue ongoing discussions. 10.Resources given to patient including quit hotline.    Return to Care: Return in about 3 months (around 11/20/2023).  Verdon Gore, MD Pulmonary and Critical Care Medicine [facility] Office:[phone]  CC: Ransom Other, MD

## 2023-08-22 DIAGNOSIS — Z23 Encounter for immunization: Secondary | ICD-10-CM | POA: Diagnosis not present

## 2023-08-22 DIAGNOSIS — J449 Chronic obstructive pulmonary disease, unspecified: Secondary | ICD-10-CM | POA: Diagnosis not present

## 2023-08-22 DIAGNOSIS — I700 Atherosclerosis of aorta: Secondary | ICD-10-CM | POA: Diagnosis not present

## 2023-08-22 DIAGNOSIS — Z0000 Encounter for general adult medical examination without abnormal findings: Secondary | ICD-10-CM | POA: Diagnosis not present

## 2023-08-22 DIAGNOSIS — N1831 Chronic kidney disease, stage 3a: Secondary | ICD-10-CM | POA: Diagnosis not present

## 2023-08-22 DIAGNOSIS — D45 Polycythemia vera: Secondary | ICD-10-CM | POA: Diagnosis not present

## 2023-08-22 DIAGNOSIS — R7303 Prediabetes: Secondary | ICD-10-CM | POA: Diagnosis not present

## 2023-08-22 DIAGNOSIS — F331 Major depressive disorder, recurrent, moderate: Secondary | ICD-10-CM | POA: Diagnosis not present

## 2023-08-22 DIAGNOSIS — I779 Disorder of arteries and arterioles, unspecified: Secondary | ICD-10-CM | POA: Diagnosis not present

## 2023-08-22 DIAGNOSIS — I2510 Atherosclerotic heart disease of native coronary artery without angina pectoris: Secondary | ICD-10-CM | POA: Diagnosis not present

## 2023-08-22 DIAGNOSIS — E782 Mixed hyperlipidemia: Secondary | ICD-10-CM | POA: Diagnosis not present

## 2023-08-22 DIAGNOSIS — Z1331 Encounter for screening for depression: Secondary | ICD-10-CM | POA: Diagnosis not present

## 2023-08-22 DIAGNOSIS — F419 Anxiety disorder, unspecified: Secondary | ICD-10-CM | POA: Diagnosis not present

## 2023-08-22 DIAGNOSIS — I10 Essential (primary) hypertension: Secondary | ICD-10-CM | POA: Diagnosis not present

## 2023-08-25 DIAGNOSIS — E875 Hyperkalemia: Secondary | ICD-10-CM | POA: Diagnosis not present

## 2023-08-27 DIAGNOSIS — E875 Hyperkalemia: Secondary | ICD-10-CM | POA: Diagnosis not present

## 2023-09-11 ENCOUNTER — Telehealth: Payer: Self-pay | Admitting: *Deleted

## 2023-09-11 NOTE — Telephone Encounter (Signed)
   Primary Cardiologist: Dorn Lesches, MD  Chart reviewed as part of pre-operative protocol coverage. Given past medical history and time since last visit, based on ACC/AHA guidelines, [name] would be at acceptable risk for the planned procedure without further cardiovascular testing.   Patient should contact our office if she is having new symptoms that are concerning from a cardiac perspective to arrange a follow-up appointment.    Per office protocol, she may hold Plavix  for 5 days prior to procedure and should resume as soon as hemodynamically stable postoperatively.  I will route this recommendation to the requesting party via Epic fax function and remove from pre-op pool.  Please call with questions.  [name], NP-C  09/11/2023, 12:22 PM [address], [address] [place], [place] [address] Office [phone] Fax [phone]

## 2023-09-11 NOTE — Telephone Encounter (Signed)
 [facility] Pre-operative Risk Assessment     Request for surgical clearance:     Endoscopy Procedure  What type of surgery is being performed?     EGD/colonoscopy  When is this surgery scheduled?     10/02/23  What type of clearance is required ?   Pharmacy  Are there any medications that need to be held prior to surgery and how long? Plavix  5 days  Practice name and name of physician performing surgery?      [facility]  What is your office phone and fax number?      Phone- [phone]  Fax- [phone]  Anesthesia type (None, local, MAC, general) ?       MAC   Please route your response to Powell Misty, CMA

## 2023-09-12 ENCOUNTER — Ambulatory Visit (HOSPITAL_BASED_OUTPATIENT_CLINIC_OR_DEPARTMENT_OTHER)
Admission: RE | Admit: 2023-09-12 | Discharge: 2023-09-12 | Disposition: A | Discharge status: Home / Self Care | Source: Ambulatory Visit | Attending: Internal Medicine | Admitting: Internal Medicine

## 2023-09-12 DIAGNOSIS — R9389 Abnormal findings on diagnostic imaging of other specified body structures: Secondary | ICD-10-CM | POA: Diagnosis not present

## 2023-09-12 DIAGNOSIS — I700 Atherosclerosis of aorta: Secondary | ICD-10-CM | POA: Diagnosis not present

## 2023-09-12 DIAGNOSIS — R918 Other nonspecific abnormal finding of lung field: Secondary | ICD-10-CM | POA: Diagnosis not present

## 2023-09-12 DIAGNOSIS — Z856 Personal history of leukemia: Secondary | ICD-10-CM | POA: Diagnosis not present

## 2023-09-12 DIAGNOSIS — J432 Centrilobular emphysema: Secondary | ICD-10-CM | POA: Diagnosis not present

## 2023-09-12 NOTE — Telephone Encounter (Signed)
 Patient informed she may hold Plavix . Patient voiced understanding.

## 2023-09-17 ENCOUNTER — Inpatient Hospital Stay: Admitting: Family

## 2023-09-17 ENCOUNTER — Inpatient Hospital Stay: Attending: Hematology & Oncology

## 2023-09-17 VITALS — BP 180/73 | HR 58 | Temp 98.1°F | Resp 18 | Ht 60.0 in | Wt 112.0 lb

## 2023-09-17 DIAGNOSIS — B1920 Unspecified viral hepatitis C without hepatic coma: Secondary | ICD-10-CM | POA: Insufficient documentation

## 2023-09-17 DIAGNOSIS — D45 Polycythemia vera: Secondary | ICD-10-CM | POA: Diagnosis not present

## 2023-09-17 DIAGNOSIS — D500 Iron deficiency anemia secondary to blood loss (chronic): Secondary | ICD-10-CM | POA: Diagnosis not present

## 2023-09-17 DIAGNOSIS — D751 Secondary polycythemia: Secondary | ICD-10-CM

## 2023-09-17 LAB — IRON AND IRON BINDING CAPACITY (CC-WL,HP ONLY)
Iron: 108 ug/dL (ref 28–170)
Saturation Ratios: 26 % (ref 10.4–31.8)
TIBC: 412 ug/dL (ref 250–450)
UIBC: 304 ug/dL

## 2023-09-17 LAB — LACTATE DEHYDROGENASE: LDH: 161 U/L (ref 98–192)

## 2023-09-17 LAB — CBC WITH DIFFERENTIAL (CANCER CENTER ONLY)
Abs Immature Granulocytes: 0.05 K/uL (ref 0.00–0.07)
Basophils Absolute: 0 K/uL (ref 0.0–0.1)
Basophils Relative: 1 %
Eosinophils Absolute: 0.1 K/uL (ref 0.0–0.5)
Eosinophils Relative: 1 %
HCT: 41.3 % (ref 36.0–46.0)
Hemoglobin: 14.2 g/dL (ref 12.0–15.0)
Immature Granulocytes: 1 %
Lymphocytes Relative: 27 %
Lymphs Abs: 1.5 K/uL (ref 0.7–4.0)
MCH: 31.6 pg (ref 26.0–34.0)
MCHC: 34.4 g/dL (ref 30.0–36.0)
MCV: 92 fL (ref 80.0–100.0)
Monocytes Absolute: 0.8 K/uL (ref 0.1–1.0)
Monocytes Relative: 13 %
Neutro Abs: 3.3 K/uL (ref 1.7–7.7)
Neutrophils Relative %: 57 %
Platelet Count: 271 K/uL (ref 150–400)
RBC: 4.49 MIL/uL (ref 3.87–5.11)
RDW: 11.3 % — ABNORMAL LOW (ref 11.5–15.5)
WBC Count: 5.7 K/uL (ref 4.0–10.5)
nRBC: 0 % (ref 0.0–0.2)

## 2023-09-17 LAB — RETICULOCYTES
Immature Retic Fract: 3.7 % (ref 2.3–15.9)
RBC.: 4.42 MIL/uL (ref 3.87–5.11)
Retic Count, Absolute: 47.3 K/uL (ref 19.0–186.0)
Retic Ct Pct: 1.1 % (ref 0.4–3.1)

## 2023-09-17 LAB — CMP (CANCER CENTER ONLY)
ALT: 13 U/L (ref 0–44)
AST: 24 U/L (ref 15–41)
Albumin: 4.5 g/dL (ref 3.5–5.0)
Alkaline Phosphatase: 84 U/L (ref 38–126)
Anion gap: 11 (ref 5–15)
BUN: 11 mg/dL (ref 8–23)
CO2: 30 mmol/L (ref 22–32)
Calcium: 10 mg/dL (ref 8.9–10.3)
Chloride: 92 mmol/L — ABNORMAL LOW (ref 98–111)
Creatinine: 0.86 mg/dL (ref 0.44–1.00)
GFR, Estimated: >60 mL/min (ref >60)
Glucose, Bld: 111 mg/dL — ABNORMAL HIGH (ref 70–99)
Potassium: 5.3 mmol/L — ABNORMAL HIGH (ref 3.5–5.1)
Sodium: 133 mmol/L — ABNORMAL LOW (ref 135–145)
Total Bilirubin: 0.6 mg/dL (ref 0.0–1.2)
Total Protein: 7.2 g/dL (ref 6.5–8.1)

## 2023-09-17 LAB — FERRITIN: Ferritin: 107 ng/mL (ref 11–307)

## 2023-09-17 NOTE — Progress Notes (Signed)
 Hematology and Oncology Follow Up Visit  [name] [id number] Jul 20, 1950 73 y.o. 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 She is feeling fatigued and has noted muscle weakness and palpitations as well. She states that her BP is fluctuating high and low and she was encouraged to follow-up with cardiology since they adjusted her medication last month. She will call and update them on her symptoms. Today's BP is 180/73, HR 58.  She is not hydrating well throughout the day. She does not like water and drinks a lot of pop.  She will try switching to decaf tea or water with fruit in it.  No fever, chills, n/v, cough, rash, dizziness, chest pain, abdominal pain or changes in bowel or bladder habits.  No swelling, tenderness, numbness or tingling in her extremities.  No falls or syncope.  Appetite is fair. Weight is 112 lbs.   ECOG Performance Status: 1 - Symptomatic but completely ambulatory  Medications:  Allergies as of 09/17/2023       Reactions   Diphenhydramine Hives   Sulfa  Antibiotics Nausea And Vomiting   Sulfa  Antibiotics Nausea Only   Sulfur Nausea Only   Bactrim  [sulfamethoxazole -trimethoprim ] Nausea And Vomiting   Nsaids Other (See Comments)   She doesn't take Nsaids because it runs up her BP.   Sulfamethoxazole -trimethoprim  Nausea And Vomiting        Medication List        Accurate as of September 17, 2023  1:09 PM. If you have any questions, ask your nurse or doctor.          acetaminophen  325 MG tablet Commonly known as: TYLENOL  Take 325-650 mg by mouth every 6 (six) hours as needed for mild pain or headache.   ALPRAZolam  0.5 MG tablet Commonly known as: XANAX  Take 0.5 mg by mouth at bedtime.   amLODipine  10 MG tablet Commonly known as:  NORVASC  Take 10 mg by mouth daily.   Ascorbic Acid 500 MG Caps Take by mouth.   Breo Ellipta  100-25 MCG/INH Aepb Generic drug: fluticasone  furoate-vilanterol INHALE ONE PUFF ONCE A DAY   clopidogrel  75 MG tablet Commonly known as: PLAVIX  Take 1 tablet by mouth once daily   clotrimazole -betamethasone  cream Commonly known as: LOTRISONE  Apply 1 application  topically 2 (two) times daily as needed (to affected area).   escitalopram  10 MG tablet Commonly known as: LEXAPRO  Take 10 mg by mouth daily. Patient is taking 5 MG daily   hydrochlorothiazide  12.5 MG tablet Commonly known as: HYDRODIURIL  Take 12.5 mg by mouth daily.   lansoprazole  30 MG capsule Commonly known as: PREVACID  Take 30 mg by mouth daily as needed (for ulcer/acid reflex). Ulcer/ acid reflux   metoprolol  succinate 100 MG 24 hr tablet Commonly known as: TOPROL -XL TAKE 1 & 1/2 (ONE & ONE-HALF) TABLETS BY MOUTH ONCE DAILY . Please make yearly appt with Dr. Claudene for May for future refills. 1st attempt   nitroGLYCERIN  0.4 MG SL tablet Commonly known as: NITROSTAT    rosuvastatin  10 MG tablet Commonly known as: CRESTOR  Take 1 tablet (10 mg total) by mouth every other day.   telmisartan  80 MG tablet Commonly known as: MICARDIS  Take 1 tablet (80 mg total) by mouth daily.   Ventolin  HFA 108 (90 Base) MCG/ACT inhaler Generic  drug: albuterol  Inhale into the lungs.   VITAMIN C PO Take 2 tablets by mouth daily.   vitamin E  180 MG (400 UNITS) capsule Take 400 Units by mouth daily.        Allergies:  Allergies  Allergen Reactions   Diphenhydramine Hives   Sulfa  Antibiotics Nausea And Vomiting   Sulfa  Antibiotics Nausea Only   Sulfur Nausea Only   Bactrim  [Sulfamethoxazole -Trimethoprim ] Nausea And Vomiting   Nsaids Other (See Comments)    She doesn't take Nsaids because it runs up her BP.   Sulfamethoxazole -Trimethoprim  Nausea And Vomiting    Past Medical History, Surgical history, Social history, and  Family History were reviewed and updated.  Review of Systems: All other 10 point review of systems is negative.   Physical Exam:  vitals were not taken for this visit.   Wt Readings from Last 3 Encounters:  08/20/23 113 lb 3.2 oz (51.3 kg)  08/16/23 114 lb (51.7 kg)  07/03/23 113 lb (51.3 kg)    Ocular: Sclerae unicteric, pupils equal, round and reactive to light Ear-nose-throat: Oropharynx clear, dentition fair Lymphatic: No cervical or supraclavicular adenopathy Lungs no rales or rhonchi, good excursion bilaterally Heart regular rate and rhythm, no murmur appreciated Abd soft, nontender, positive bowel sounds MSK no focal spinal tenderness, no joint edema Neuro: non-focal, well-oriented, appropriate affect Breasts: Deferred   Lab Results  Component Value Date   WBC 8.1 06/18/2023   HGB 13.8 06/18/2023   HCT 40.9 06/18/2023   MCV 95.8 06/18/2023   PLT 298 06/18/2023   Lab Results  Component Value Date   FERRITIN 88 06/18/2023   IRON 113 06/18/2023   TIBC 424 06/18/2023   UIBC 311 06/18/2023   IRONPCTSAT 27 06/18/2023   Lab Results  Component Value Date   RETICCTPCT 1.7 06/18/2023   RBC 4.27 06/18/2023   RBC 4.26 06/18/2023   RETICCTABS 61.3 08/03/2010   No results found for: KPAFRELGTCHN, LAMBDASER, KAPLAMBRATIO No results found for: KIMBERLY LE, IGMSERUM No results found for: STEPHANY CARLOTA BENSON MARKEL EARLA JOANNIE DOC VICK, SPEI   Chemistry      Component Value Date/Time   NA 131 (L) 06/18/2023 1304   NA 137 04/01/2019 1649   NA 139 01/03/2017 1327   NA 136 12/28/2015 1311   K 4.0 06/18/2023 1304   K 3.7 01/03/2017 1327   K 4.9 12/28/2015 1311   CL 90 (L) 06/18/2023 1304   CL 94 (L) 01/03/2017 1327   CO2 29 06/18/2023 1304   CO2 31 01/03/2017 1327   CO2 28 12/28/2015 1311   BUN 15 06/18/2023 1304   BUN 14 04/01/2019 1649   BUN 12 01/03/2017 1327   BUN 10.4 12/28/2015 1311   CREATININE 0.87  06/18/2023 1304   CREATININE 1.0 01/03/2017 1327   CREATININE 0.9 12/28/2015 1311      Component Value Date/Time   CALCIUM  9.9 06/18/2023 1304   CALCIUM  8.8 01/03/2017 1327   CALCIUM  9.6 12/28/2015 1311   ALKPHOS 64 06/18/2023 1304   ALKPHOS 85 (H) 01/03/2017 1327   ALKPHOS 83 12/28/2015 1311   AST 18 06/18/2023 1304   AST 25 12/28/2015 1311   ALT 9 06/18/2023 1304   ALT 19 01/03/2017 1327   ALT 13 12/28/2015 1311   BILITOT 0.6 06/18/2023 1304   BILITOT 0.74 12/28/2015 1311       Impression and Plan: Ms. Jezek is a 73 yo caucasian female with polycythemia.  She is triple negative.  She is symptomatic as mentioned  above.  Hct is 41%. No phlebotomy needed.  Iron studies are pending. Follow-up in 8 weeks.   Lauraine Pepper, NP 7/28/20251:09 PM

## 2023-09-19 ENCOUNTER — Ambulatory Visit: Payer: Self-pay | Admitting: Internal Medicine

## 2023-09-24 ENCOUNTER — Encounter: Payer: Self-pay | Admitting: Gastroenterology

## 2023-10-01 ENCOUNTER — Telehealth: Payer: Self-pay | Admitting: Gastroenterology

## 2023-10-01 NOTE — Telephone Encounter (Signed)
 Good morning Dr. Wilhelmenia, this patient call and stated that she needed to reschedule her procedure due to her feeling sick from her blood disease that has her potassium high and she stated that she needs to have her blood drawn in order to have this procedure done. Patient was schedule for her Endo colon for August the 12 th and was reschedule for September the 9 th. Please advise.

## 2023-10-01 NOTE — Telephone Encounter (Signed)
 Called patient and let her know that I sent new instructions via Mychart for her new procedure date, also advised her to hold her plavix  for 5 days like before. She said she understood and had no further questions.

## 2023-10-02 ENCOUNTER — Encounter: Admitting: Gastroenterology

## 2023-10-02 NOTE — Telephone Encounter (Signed)
 Thank you for helping with this Duwaine and Olam. GM

## 2023-10-30 ENCOUNTER — Ambulatory Visit: Admitting: Gastroenterology

## 2023-10-30 ENCOUNTER — Encounter: Payer: Self-pay | Admitting: Gastroenterology

## 2023-10-30 VITALS — BP 85/52 | HR 57 | Temp 97.7°F | Ht 60.0 in | Wt 114.0 lb

## 2023-10-30 DIAGNOSIS — K219 Gastro-esophageal reflux disease without esophagitis: Secondary | ICD-10-CM

## 2023-10-30 DIAGNOSIS — Z860101 Personal history of adenomatous and serrated colon polyps: Secondary | ICD-10-CM

## 2023-10-30 MED ORDER — SODIUM CHLORIDE 0.9 % IV SOLN: 500.0000 mL | Freq: Once | INTRAVENOUS | Status: DC

## 2023-10-30 NOTE — Progress Notes (Signed)
 During admission interview pt reported that she had taken her Nitroglycerin  about 2-3 weeks ago for chest pain. She reports she has to take it about every 2-3 months. After CRNA discussed with pt it was found that she did not report having to take the Nitroglycerin  for chest pain to the Cardiologist when she was seen in May. Pt also reported to RN that she had chest pain last night but did not take a Nitroglycerin . CRNA and MD discussed and determined that pt needed to see her Cardiologist and report the chest pain and nitroglycerin  use and that she needs clearance before she can be rescheduled for EGD and colonoscopy. Pt instructed to call back to reschedule once she obtains clearance. Pt verbalized understanding.

## 2023-10-30 NOTE — Telephone Encounter (Signed)
 FYI - Pt called in stating [facility] requires that she needs an appt due to having chest pain yesterday. Informed her next available with Dr. Court 11/12/23. She states  I can't wait that long. I offered this week with Jackee, NP, she states  I don't need to see a PA I need to see a heart doctor. Informed that her that would be the soonest with Dr. Court and I wouldn't be able to schedule with another MD because Dr. Court is her MD. She states no wonder you have a low rating on google and hung up.

## 2023-10-31 ENCOUNTER — Telehealth: Payer: Self-pay | Admitting: Cardiovascular Disease

## 2023-10-31 NOTE — Telephone Encounter (Signed)
 Would probably be best to see DOD for this if she is insistent on seeing MD and next appt is 2 weeks away.  Can add PRE-OP to info.

## 2023-10-31 NOTE — Telephone Encounter (Signed)
 Left message for pt to call our office ASAP so that we can get an IN OFFICE appt scheduled to be seen for the chest pains (pt stated she reported to [facility]) and preop clearance.  Per Lamarr Satterfield, NP it would be probably best to see DOD.   On message I did inform patient possibly could schedule for 11/07/23 for DOD but to call back ASAP due to I could not guarantee appts would be available if waited to long.

## 2023-10-31 NOTE — Telephone Encounter (Signed)
 Spoke with patient of Dr. Court -- colonoscopy/endoscopy cancelled yesterday - see note in EPIC. Concerned about length of time under anesthesia.   She also reports intermittent chest pain, leg pain/weakness and unable to walk to mailbox.   Scheduled for OV with Dr. Court on 11/12/23  Routed to MD to review

## 2023-10-31 NOTE — Telephone Encounter (Signed)
 I will forward to preop APP to review the notes about pt with chest pain and needs in office appt.   Will have preop APP review notes and provide further recommendations as well.

## 2023-10-31 NOTE — Telephone Encounter (Signed)
 Pt calling about having an echo stress test. Pt arrived for procedure and was prepped, but anesthesiologist was not comfortable continuing. Pt was told she would have to have an echo stress test before continuing. Please advise.

## 2023-11-01 ENCOUNTER — Ambulatory Visit: Admitting: Nurse Practitioner

## 2023-11-02 NOTE — Telephone Encounter (Signed)
 Pt has IN OFFICE appt 11/12/23 with Dorn Lesches, MD at this time preop clearance will be addressed.  Will update the surgeons office

## 2023-11-12 ENCOUNTER — Ambulatory Visit: Admitting: Cardiovascular Disease

## 2023-11-14 ENCOUNTER — Inpatient Hospital Stay: Attending: Family

## 2023-11-14 ENCOUNTER — Inpatient Hospital Stay (HOSPITAL_BASED_OUTPATIENT_CLINIC_OR_DEPARTMENT_OTHER): Admitting: Hematology & Oncology

## 2023-11-14 ENCOUNTER — Encounter: Payer: Self-pay | Admitting: Hematology & Oncology

## 2023-11-14 VITALS — BP 169/82 | HR 56 | Temp 98.7°F | Resp 18 | Wt 114.8 lb

## 2023-11-14 DIAGNOSIS — D751 Secondary polycythemia: Secondary | ICD-10-CM

## 2023-11-14 DIAGNOSIS — B1920 Unspecified viral hepatitis C without hepatic coma: Secondary | ICD-10-CM | POA: Diagnosis not present

## 2023-11-14 DIAGNOSIS — D45 Polycythemia vera: Secondary | ICD-10-CM | POA: Insufficient documentation

## 2023-11-14 DIAGNOSIS — D500 Iron deficiency anemia secondary to blood loss (chronic): Secondary | ICD-10-CM

## 2023-11-14 DIAGNOSIS — E785 Hyperlipidemia, unspecified: Secondary | ICD-10-CM

## 2023-11-14 DIAGNOSIS — Z87891 Personal history of nicotine dependence: Secondary | ICD-10-CM | POA: Insufficient documentation

## 2023-11-14 LAB — CBC WITH DIFFERENTIAL (CANCER CENTER ONLY)
Abs Immature Granulocytes: 0.02 K/uL (ref 0.00–0.07)
Basophils Absolute: 0 K/uL (ref 0.0–0.1)
Basophils Relative: 1 %
Eosinophils Absolute: 0.1 K/uL (ref 0.0–0.5)
Eosinophils Relative: 1 %
HCT: 40.6 % (ref 36.0–46.0)
Hemoglobin: 13.9 g/dL (ref 12.0–15.0)
Immature Granulocytes: 0 %
Lymphocytes Relative: 30 %
Lymphs Abs: 1.9 K/uL (ref 0.7–4.0)
MCH: 31.8 pg (ref 26.0–34.0)
MCHC: 34.2 g/dL (ref 30.0–36.0)
MCV: 92.9 fL (ref 80.0–100.0)
Monocytes Absolute: 0.6 K/uL (ref 0.1–1.0)
Monocytes Relative: 10 %
Neutro Abs: 3.7 K/uL (ref 1.7–7.7)
Neutrophils Relative %: 58 %
Platelet Count: 281 K/uL (ref 150–400)
RBC: 4.37 MIL/uL (ref 3.87–5.11)
RDW: 11.6 % (ref 11.5–15.5)
WBC Count: 6.3 K/uL (ref 4.0–10.5)
nRBC: 0 % (ref 0.0–0.2)

## 2023-11-14 LAB — CMP (CANCER CENTER ONLY)
ALT: 13 U/L (ref 0–44)
AST: 22 U/L (ref 15–41)
Albumin: 4.5 g/dL (ref 3.5–5.0)
Alkaline Phosphatase: 91 U/L (ref 38–126)
Anion gap: 9 (ref 5–15)
BUN: 20 mg/dL (ref 8–23)
CO2: 30 mmol/L (ref 22–32)
Calcium: 9.6 mg/dL (ref 8.9–10.3)
Chloride: 92 mmol/L — ABNORMAL LOW (ref 98–111)
Creatinine: 0.79 mg/dL (ref 0.44–1.00)
GFR, Estimated: >60 mL/min (ref >60)
Glucose, Bld: 108 mg/dL — ABNORMAL HIGH (ref 70–99)
Potassium: 4.9 mmol/L (ref 3.5–5.1)
Sodium: 131 mmol/L — ABNORMAL LOW (ref 135–145)
Total Bilirubin: 0.3 mg/dL (ref 0.0–1.2)
Total Protein: 7 g/dL (ref 6.5–8.1)

## 2023-11-14 LAB — IRON AND IRON BINDING CAPACITY (CC-WL,HP ONLY)
Iron: 77 ug/dL (ref 28–170)
Saturation Ratios: 19 % (ref 10.4–31.8)
TIBC: 405 ug/dL (ref 250–450)
UIBC: 328 ug/dL

## 2023-11-14 LAB — FERRITIN: Ferritin: 100 ng/mL (ref 11–307)

## 2023-11-14 NOTE — Progress Notes (Signed)
 Hematology and Oncology Follow Up Visit  [name] [id number] [birth year] 73 y.o. 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 is here today for follow-up.  She does not feel well.  She still has had issues.  She apparently had cardiac issues.  She is post to see Dr. Court of Cardiology next week.    She is having thigh pain bilaterally.  She is having some numbness in the bottom of her left foot.  I suspect this probably might be reflective of peripheral vascular disease.  She was a heavy smoker for many years.  She has stopped thing back in January.  I suspect that she probably is going to need to have arterial vascular studies done.  Another issue might be she could have spinal stenosis causing her bilateral thigh pain.  She is also worried regarding some family issues.  She also is worried about her blood pressure going up and down.  We have I told her that all of this could be managed by her cardiologist or her family practice doctor.  She has had no fever.  Thankfully, there is been no issues with COVID.  She is having some problems with bruising.  I suspect this probably is from her Plavix .  I told her to take some vitamin C.  This may help some of the bruising.  I do not see a reason why she could not take vitamin C.  Overall, I would have to say that her performance status is probably ECOG 1.     Medications:  Allergies as of 11/14/2023       Reactions   Diphenhydramine Hives   Reaction was many years ago, I have taken one since for a bee sting and had no issues.   Sulfa  Antibiotics Nausea And Vomiting   Sulfur Nausea Only   Amlodipine  Besylate Other (See Comments)   headaches   Nsaids Other (See Comments)   She doesn't take Nsaids because it runs up her BP.    Sulfamethoxazole -trimethoprim  Nausea And Vomiting        Medication List        Accurate as of November 14, 2023  3:20 PM. If you have any questions, ask your nurse or doctor.          acetaminophen  325 MG tablet Commonly known as: TYLENOL  Take 325-650 mg by mouth every 6 (six) hours as needed for mild pain or headache.   ALPRAZolam  0.5 MG tablet Commonly known as: XANAX  Take 0.5 mg by mouth at bedtime.   amLODipine  10 MG tablet Commonly known as: NORVASC  Take 10 mg by mouth daily.   Breo Ellipta  100-25 MCG/INH Aepb Generic drug: fluticasone  furoate-vilanterol INHALE ONE PUFF ONCE A DAY   clopidogrel  75 MG tablet Commonly known as: PLAVIX  Take 1 tablet by mouth once daily   clotrimazole -betamethasone  cream Commonly known as: LOTRISONE  Apply 1 application  topically 2 (two) times daily as needed (to affected area).   escitalopram  10 MG tablet Commonly known as: LEXAPRO  Take 10 mg by mouth daily. Patient is taking 5 MG daily   hydrochlorothiazide  12.5 MG tablet Commonly known as: HYDRODIURIL  Take 12.5 mg by mouth daily.   lansoprazole  30 MG capsule Commonly known as: PREVACID  Take 30 mg by mouth daily as needed (for ulcer/acid reflex). Ulcer/  acid reflux   metoprolol  succinate 100 MG 24 hr tablet Commonly known as: TOPROL -XL TAKE 1 & 1/2 (ONE & ONE-HALF) TABLETS BY MOUTH ONCE DAILY . Please make yearly appt with Dr. Claudene for May for future refills. 1st attempt   nitroGLYCERIN  0.4 MG SL tablet Commonly known as: NITROSTAT    rosuvastatin  10 MG tablet Commonly known as: CRESTOR  Take 1 tablet (10 mg total) by mouth every other day.   telmisartan  80 MG tablet Commonly known as: MICARDIS  Take 1 tablet (80 mg total) by mouth daily.   Ventolin  HFA 108 (90 Base) MCG/ACT inhaler Generic drug: albuterol  Inhale into the lungs.   VITAMIN C PO Take 2 tablets by mouth daily.        Allergies:  Allergies  Allergen Reactions   Diphenhydramine Hives     Reaction was many years ago, I have taken one since for a bee sting and had no issues.   Sulfa  Antibiotics Nausea And Vomiting   Sulfur Nausea Only   Amlodipine  Besylate Other (See Comments)    headaches   Nsaids Other (See Comments)    She doesn't take Nsaids because it runs up her BP.   Sulfamethoxazole -Trimethoprim  Nausea And Vomiting    Past Medical History, Surgical history, Social history, and Family History were reviewed and updated.  Review of Systems: Review of Systems  Constitutional:  Positive for malaise/fatigue and weight loss.  HENT: Negative.    Eyes: Negative.   Respiratory: Negative.    Cardiovascular: Negative.   Gastrointestinal: Negative.   Genitourinary: Negative.   Musculoskeletal:  Positive for joint pain and myalgias.  Skin: Negative.   Neurological:  Positive for sensory change.  Endo/Heme/Allergies:  Bruises/bleeds easily.  Psychiatric/Behavioral: Negative.        Physical Exam:  weight is 114 lb 12.8 oz (52.1 kg). Her oral temperature is 98.7 F (37.1 C). Her blood pressure is 169/82 (abnormal) and her pulse is 56 (abnormal). Her respiration is 18 and oxygen saturation is 100%.   Wt Readings from Last 3 Encounters:  11/14/23 114 lb 12.8 oz (52.1 kg)  10/30/23 114 lb (51.7 kg)  09/17/23 112 lb (50.8 kg)    Ocular: Sclerae unicteric, pupils equal, round and reactive to light Ear-nose-throat: Oropharynx clear, dentition fair Lymphatic: No cervical or supraclavicular adenopathy Lungs no rales or rhonchi, good excursion bilaterally Heart regular rate and rhythm, no murmur appreciated Abd soft, nontender, positive bowel sounds MSK no focal spinal tenderness, no joint edema Neuro: non-focal, well-oriented, appropriate affect Breasts: Deferred   Lab Results  Component Value Date   WBC 6.3 11/14/2023   HGB 13.9 11/14/2023   HCT 40.6 11/14/2023   MCV 92.9 11/14/2023   PLT 281 11/14/2023   Lab Results  Component Value Date   FERRITIN 107  09/17/2023   IRON 108 09/17/2023   TIBC 412 09/17/2023   UIBC 304 09/17/2023   IRONPCTSAT 26 09/17/2023   Lab Results  Component Value Date   RETICCTPCT 1.1 09/17/2023   RBC 4.37 11/14/2023   RETICCTABS 61.3 08/03/2010   No results found for: KPAFRELGTCHN, LAMBDASER, KAPLAMBRATIO No results found for: IGGSERUM, IGA, IGMSERUM No results found for: STEPHANY RINGS, A1GS, A2GS, EARLA JOANNIE KNIGHTS, MSPIKE, SPEI   Chemistry      Component Value Date/Time   NA 131 (L) 11/14/2023 1359   NA 137 04/01/2019 1649   NA 139 01/03/2017 1327   NA 136 12/28/2015 1311   K 4.9 11/14/2023 1359   K 3.7 01/03/2017 1327   K  4.9 12/28/2015 1311   CL 92 (L) 11/14/2023 1359   CL 94 (L) 01/03/2017 1327   CO2 30 11/14/2023 1359   CO2 31 01/03/2017 1327   CO2 28 12/28/2015 1311   BUN 20 11/14/2023 1359   BUN 14 04/01/2019 1649   BUN 12 01/03/2017 1327   BUN 10.4 12/28/2015 1311   CREATININE 0.79 11/14/2023 1359   CREATININE 1.0 01/03/2017 1327   CREATININE 0.9 12/28/2015 1311      Component Value Date/Time   CALCIUM  9.6 11/14/2023 1359   CALCIUM  8.8 01/03/2017 1327   CALCIUM  9.6 12/28/2015 1311   ALKPHOS 91 11/14/2023 1359   ALKPHOS 85 (H) 01/03/2017 1327   ALKPHOS 83 12/28/2015 1311   AST 22 11/14/2023 1359   AST 25 12/28/2015 1311   ALT 13 11/14/2023 1359   ALT 19 01/03/2017 1327   ALT 13 12/28/2015 1311   BILITOT 0.3 11/14/2023 1359   BILITOT 0.74 12/28/2015 1311       Impression and Plan: Ms. Hammer is a 73 yo caucasian female with polycythemia.  She is triple negative.   Clearly, her problems are more than just her polycythemia.  I think this has not been an issue.  She does not need to be phlebotomized.  I suspect that she does some vascular issues.  I do suspect this probably is from her past smoking history.  It will be interesting to see what happens with Dr. Ranee appointment.  Apparently, she is post to have a colonoscopy at  the maybe upper endoscopy but this was postponed because of possible cardiac issues.  From my point of view, we will get her back in 2 months.  I would like to get her back before the Holiday season.  Hopefully, she will be feeling better.  I know that if the family issues are causing a lot of stress for her.    Maude JONELLE Crease, MD 9/24/20253:20 PM

## 2023-11-21 ENCOUNTER — Encounter: Payer: Self-pay | Admitting: Cardiovascular Disease

## 2023-11-21 ENCOUNTER — Ambulatory Visit: Attending: Internal Medicine | Admitting: Cardiovascular Disease

## 2023-11-21 VITALS — BP 180/78 | HR 59 | Resp 16 | Ht 60.0 in | Wt 117.8 lb

## 2023-11-21 DIAGNOSIS — I739 Peripheral vascular disease, unspecified: Secondary | ICD-10-CM | POA: Diagnosis not present

## 2023-11-21 DIAGNOSIS — I25119 Atherosclerotic heart disease of native coronary artery with unspecified angina pectoris: Secondary | ICD-10-CM | POA: Diagnosis not present

## 2023-11-21 DIAGNOSIS — I10 Essential (primary) hypertension: Secondary | ICD-10-CM

## 2023-11-21 DIAGNOSIS — E785 Hyperlipidemia, unspecified: Secondary | ICD-10-CM

## 2023-11-21 DIAGNOSIS — I6523 Occlusion and stenosis of bilateral carotid arteries: Secondary | ICD-10-CM | POA: Diagnosis not present

## 2023-11-21 DIAGNOSIS — Z720 Tobacco use: Secondary | ICD-10-CM | POA: Diagnosis not present

## 2023-11-21 MED ORDER — ROSUVASTATIN CALCIUM 10 MG PO TABS: 10.0000 mg | ORAL_TABLET | Freq: Every day | ORAL | 3 refills | Status: AC

## 2023-11-21 NOTE — Assessment & Plan Note (Signed)
 History of essential hypertension blood pressure measured today at 180/78.  She is on Micardis , metoprolol  and hydrochlorothiazide .  She says her blood pressure is labile.  Of asked her to keep a blood pressure log for the next 2 weeks and she will see Pharm.D. back after that to review make appropriate changes.

## 2023-11-21 NOTE — Assessment & Plan Note (Signed)
 Discontinue tobacco use with continued vaping.

## 2023-11-21 NOTE — Assessment & Plan Note (Signed)
 History of bilateral carotid endarterectomy in 2007 2018 followed by [facility].

## 2023-11-21 NOTE — Assessment & Plan Note (Signed)
 Patient complains of lifestyle-limiting claudication.  Will check ABIs to further evaluate.

## 2023-11-21 NOTE — Assessment & Plan Note (Signed)
 Hyper lipidemia on Crestor  which she has stopped taking on her own.  Will recheck a lipid liver profile 3 months after she starts being medically compliant.

## 2023-11-21 NOTE — Assessment & Plan Note (Signed)
 History of CAD status post coronary stenting in 2010.  She has had multiple negative stress test since that time the most recent of which was 05/05/2020.  She does get frequent chest pain and occasionally has to take sublingual nitroglycerin .  I am going to get a Lexiscan  Myoview  to further evaluate.

## 2023-11-21 NOTE — Patient Instructions (Addendum)
 Medication Instructions:  Your physician has recommended you make the following change in your medication:   -Restart rosuvastatin  (Crestor ) 10mg  once daily.  *If you need a refill on your cardiac medications before your next appointment, please call your pharmacy*  Lab Work: Your physician recommends that you return for lab work in: 3 months for FASTING lipid/liver panel  If you have labs (blood work) drawn today and your tests are completely normal, you will receive your results only by: MyChart Message (if you have MyChart) OR A paper copy in the mail If you have any lab test that is abnormal or we need to change your treatment, we will call you to review the results.  Testing/Procedures: Dr. Court has ordered a Lexiscan  Myocardial Perfusion Imaging Study.  Please arrive 15 minutes prior to your appointment time for registration and insurance purposes.   The test will take approximately 3 to 4 hours to complete; you may bring reading material.  If someone comes with you to your appointment, they will need to remain in the main lobby due to limited space in the testing area. **If you are pregnant or breastfeeding, please notify the nuclear lab prior to your appointment**   How to prepare for your Myocardial Perfusion Test: Do not eat or drink 3 hours prior to your test, except you may have water. Do not consume products containing caffeine (regular or decaffeinated) 12 hours prior to your test. (ex: coffee, chocolate, sodas, tea). Do wear comfortable clothes (no dresses or overalls) and walking shoes, tennis shoes preferred (No heels or open toe shoes are allowed). Do NOT wear cologne, perfume, aftershave, or lotions (deodorant is allowed). If you use an inhaler, use it the AM of your test and bring it with you.  If you use a nebulizer, use it the AM of your test.  If these instructions are not followed, your test will have to be rescheduled.   Your physician has requested that you  have an ankle brachial index (ABI). During this test an ultrasound and blood pressure cuff are used to evaluate the arteries that supply the arms and legs with blood. Allow thirty minutes for this exam. There are no restrictions or special instructions. This will take place at [address], 4th floor   Please note: We ask at that you not bring children with you during ultrasound (echo/ vascular) testing. Due to room size and safety concerns, children are not allowed in the ultrasound rooms during exams. Our front office staff cannot provide observation of children in our lobby area while testing is being conducted. An adult accompanying a patient to their appointment will only be allowed in the ultrasound room at the discretion of the ultrasound technician under special circumstances. We apologize for any inconvenience.    Follow-Up: At [facility], you and your health needs are our priority.  As part of our continuing mission to provide you with exceptional heart care, our providers are all part of one team.  This team includes your primary Cardiologist (physician) and Advanced Practice Providers or APPs (Physician Assistants and Nurse Practitioners) who all work together to provide you with the care you need, when you need it.  Your next appointment:   6 month(s)  Provider:   Josefa Beauvais, NP, Orren Fabry, PA-C, Dayna Dunn, PA-C, Callie Goodrich, PA-C, Kathleen Johnson, PA-C, or Glendia Ferrier, PA-C         Then, Dorn Court, MD will plan to see you again in 12 month(s).  We recommend signing up for the patient portal called MyChart.  Sign up information is provided on this After Visit Summary.  MyChart is used to connect with patients for Virtual Visits (Telemedicine).  Patients are able to view lab/test results, encounter notes, upcoming appointments, etc.  Non-urgent messages can be sent to your provider as well.   To learn more about what you can do with MyChart, go  to [email or web address].   Other Instructions Dr. Court has requested that you schedule an appointment with one of our clinical pharmacists for a blood pressure check appointment within the next 4-5 weeks.  If you monitor your blood pressure (BP) at home, please bring your BP cuff and your BP readings with you to this appointment  HOW TO TAKE YOUR BLOOD PRESSURE: Rest 5 minutes before taking your blood pressure. Don't smoke or drink caffeinated beverages for at least 30 minutes before. Take your blood pressure before (not after) you eat. Sit comfortably with your back supported and both feet on the floor (don't cross your legs). Elevate your arm to heart level on a table or a desk. Use the proper sized cuff. It should fit smoothly and snugly around your bare upper arm. There should be enough room to slip a fingertip under the cuff. The bottom edge of the cuff should be 1 inch above the crease of the elbow. Ideally, take 3 measurements at one sitting and record the average.

## 2023-11-21 NOTE — Progress Notes (Signed)
 11/21/2023 [name]   03-21-1950  [id number]  Primary Physician Ransom Other, MD Primary Cardiologist: Dorn JINNY Lesches MD [place], [place]  HPI:  [name] is a 73 y.o.   thin-appearing divorced Caucasian female mother of 2, grandmother of 5 grandchildren who is retired from being an Art therapist at [employer].  She was formally a patient of Dr. Esmeralda Sharps and then Dr. Dann.  I am assuming her care in their absence.  I last saw her in the office 07/03/2023.  She has a history of ongoing tobacco abuse of 1 pack/day for the last 40 years off and on.  She has treated hypertension hyperlipidemia.  She is never had a heart attack or stroke.  She had coronary artery stenting in 2010, bilateral carotid endarterectomy in 2007 and 2018 followed by her PCP.  She also has P vera followed by hematologist Dr. Timmy.  She really is not very active.  She admits to being depressed and bays being dizzy occasionally.  1 week ago she had an episode which sounds like a TIA with numbness on the left side of her body.  She did check her blood pressure at the time it was 87/45.  Later that night her blood pressure had increased and her symptoms had resolved.  Since I saw her 5 months ago she has had occasional chest pain requiring sublingual nitroglycerin .  She also complains of bilateral upper thigh discomfort when ambulating.   Current Meds  Medication Sig   acetaminophen  (TYLENOL ) 325 MG tablet Take 325-650 mg by mouth every 6 (six) hours as needed for mild pain or headache.   albuterol  (VENTOLIN  HFA) 108 (90 Base) MCG/ACT inhaler Inhale into the lungs.   ALPRAZolam  (XANAX ) 0.5 MG tablet Take 0.5 mg by mouth at bedtime.    Ascorbic Acid (VITAMIN C PO) Take 2 tablets by mouth daily.   clopidogrel  (PLAVIX ) 75 MG tablet Take 1 tablet by mouth once daily   clotrimazole -betamethasone  (LOTRISONE ) cream Apply 1 application  topically 2 (two) times daily as  needed (to affected area).   escitalopram  (LEXAPRO ) 10 MG tablet Take 10 mg by mouth daily. Patient is taking 5 MG daily   hydrochlorothiazide  (HYDRODIURIL ) 12.5 MG tablet Take 12.5 mg by mouth daily.   lansoprazole  (PREVACID ) 30 MG capsule Take 30 mg by mouth daily as needed (for ulcer/acid reflex). Ulcer/ acid reflux   metoprolol  succinate (TOPROL -XL) 100 MG 24 hr tablet TAKE 1 & 1/2 (ONE & ONE-HALF) TABLETS BY MOUTH ONCE DAILY . Please make yearly appt with Dr. Sharps for May for future refills. 1st attempt (Patient taking differently: Take 150 mg by mouth daily. Patient reported that she is currently taking 150mg )   nitroGLYCERIN  (NITROSTAT ) 0.4 MG SL tablet    telmisartan  (MICARDIS ) 80 MG tablet Take 1 tablet (80 mg total) by mouth daily.   Current Facility-Administered Medications for the 11/21/23 encounter (Office Visit) with Lesches Dorn JINNY, MD  Medication   0.9 %  sodium chloride  infusion     Allergies  Allergen Reactions   Diphenhydramine Hives    Reaction was many years ago, I have taken one since for a bee sting and had no issues.   Sulfa  Antibiotics Nausea And Vomiting   Sulfur Nausea Only   Amlodipine  Besylate Other (See Comments)    headaches   Nsaids Other (See Comments)    She doesn't take Nsaids because it runs up her BP.   Sulfamethoxazole -Trimethoprim  Nausea And Vomiting  Social History   Socioeconomic History   Marital status: Single    Spouse name: Not on file   Number of children: 2   Years of education: Not on file   Highest education level: Not on file  Occupational History   Not on file  Tobacco Use   Smoking status: Some Days    Current packs/day: 0.50    Average packs/day: 1.5 packs/day for 57.7 years (85.9 ttl pk-yrs)    Types: Cigarettes, E-cigarettes    Start date: 1968    Last attempt to quit: 2025    Passive exposure: Never   Smokeless tobacco: Current   Tobacco comments:    States only smokes socially, uses nicotine  pouches  Vaping  Use   Vaping status: Some Days   Start date: 08/07/2020   Substances: Nicotine   Substance and Sexual Activity   Alcohol use: Not Currently    Comment: socially occasionally   Drug use: Not Currently    Comment: has smoked cigarettes off and on   Sexual activity: Not Currently  Other Topics Concern   Not on file  Social History Narrative   ** Merged History Encounter **       ** Merged History Encounter **       Tobacco use cigarettes: Current smoker Frequency 1 PPD Estimated Pack - years :30 Smoking : yes  No Tobacco exposure No alcohol No exercise Occupation : employed, works at home for [employer], Engineer, maintenance (IT)   n Boys 1 Girls 1   Social Drivers of Corporate investment banker Strain: Not on file  Food Insecurity: No Food Insecurity (07/17/2019)   Hunger Vital Sign    Worried About Running Out of Food in the Last Year: Never true    Ran Out of Food in the Last Year: Never true  Transportation Needs: Not on file  Physical Activity: Not on file  Stress: Not on file  Social Connections: Not on file  Intimate Partner Violence: Not on file     Review of Systems: General: negative for chills, fever, night sweats or weight changes.  Cardiovascular: negative for chest pain, dyspnea on exertion, edema, orthopnea, palpitations, paroxysmal nocturnal dyspnea or shortness of breath Dermatological: negative for rash Respiratory: negative for cough or wheezing Urologic: negative for hematuria Abdominal: negative for nausea, vomiting, diarrhea, bright red blood per rectum, melena, or hematemesis Neurologic: negative for visual changes, syncope, or dizziness All other systems reviewed and are otherwise negative except as noted above.    Blood pressure (!) 180/78, pulse (!) 59, resp. rate 16, height 5' (1.524 m), weight 117 lb 12.8 oz (53.4 kg), SpO2 96%.  General appearance: alert and no distress Neck: no adenopathy, no carotid bruit, no  JVD, supple, symmetrical, trachea midline, and thyroid  not enlarged, symmetric, no tenderness/mass/nodules Lungs: clear to auscultation bilaterally Heart: regular rate and rhythm, S1, S2 normal, no murmur, click, rub or gallop Extremities: extremities normal, atraumatic, no cyanosis or edema Pulses: 2+ and symmetric Skin: Skin color, texture, turgor normal. No rashes or lesions Neurologic: Grossly normal  EKG EKG Interpretation Date/Time:  Wednesday November 21 2023 14:01:37 EDT Ventricular Rate:  55 PR Interval:  198 QRS Duration:  82 QT Interval:  462 QTC Calculation: 441 R Axis:   69  Text Interpretation: Sinus bradycardia Cannot rule out Anterior infarct , age undetermined When compared with ECG of 03-Jul-2023 14:19, No significant change was found Confirmed by Court Carrier [phone]) on 11/21/2023 2:13:21 PM  ASSESSMENT AND PLAN:   Hyperlipidemia LDL goal <70 Hyper lipidemia on Crestor  which she has stopped taking on her own.  Will recheck a lipid liver profile 3 months after she starts being medically compliant.  Essential hypertension History of essential hypertension blood pressure measured today at 180/78.  She is on Micardis , metoprolol  and hydrochlorothiazide .  She says her blood pressure is labile.  Of asked her to keep a blood pressure log for the next 2 weeks and she will see Pharm.D. back after that to review make appropriate changes.  Coronary artery disease involving native coronary artery of native heart with angina pectoris History of CAD status post coronary stenting in 2010.  She has had multiple negative stress test since that time the most recent of which was 05/05/2020.  She does get frequent chest pain and occasionally has to take sublingual nitroglycerin .  I am going to get a Lexiscan  Myoview  to further evaluate.  Tobacco abuse Discontinue tobacco use with continued vaping.  Carotid artery stenosis History of bilateral carotid endarterectomy in 2007 2018  followed by [facility].  Claudication in peripheral vascular disease Patient complains of lifestyle-limiting claudication.  Will check ABIs to further evaluate.     Dorn DOROTHA Lesches MD FACP,FACC,FAHA, [facility] 11/21/2023 2:31 PM

## 2023-11-26 ENCOUNTER — Encounter (HOSPITAL_COMMUNITY): Payer: Self-pay

## 2023-11-26 ENCOUNTER — Ambulatory Visit: Admitting: Internal Medicine

## 2023-11-26 ENCOUNTER — Encounter

## 2023-12-03 ENCOUNTER — Ambulatory Visit: Admitting: Internal Medicine

## 2023-12-03 ENCOUNTER — Ambulatory Visit: Admitting: *Deleted

## 2023-12-03 ENCOUNTER — Encounter: Payer: Self-pay | Admitting: Internal Medicine

## 2023-12-03 VITALS — BP 142/74 | HR 53 | Temp 97.9°F | Ht 61.0 in | Wt 119.0 lb

## 2023-12-03 DIAGNOSIS — F17200 Nicotine dependence, unspecified, uncomplicated: Secondary | ICD-10-CM

## 2023-12-03 DIAGNOSIS — R918 Other nonspecific abnormal finding of lung field: Secondary | ICD-10-CM

## 2023-12-03 DIAGNOSIS — J4489 Other specified chronic obstructive pulmonary disease: Secondary | ICD-10-CM | POA: Diagnosis not present

## 2023-12-03 DIAGNOSIS — R0602 Shortness of breath: Secondary | ICD-10-CM

## 2023-12-03 DIAGNOSIS — F17210 Nicotine dependence, cigarettes, uncomplicated: Secondary | ICD-10-CM

## 2023-12-03 LAB — PULMONARY FUNCTION TEST
DL/VA % pred: 59 %
DL/VA: 2.52 ml/min/mmHg/L
DLCO cor % pred: 59 %
DLCO cor: 10.35 ml/min/mmHg
DLCO unc % pred: 60 %
DLCO unc: 10.51 ml/min/mmHg
FEF 25-75 Post: 1 L/s
FEF 25-75 Pre: 0.61 L/s
FEF2575-%Change-Post: 62 %
FEF2575-%Pred-Post: 62 %
FEF2575-%Pred-Pre: 38 %
FEV1-%Change-Post: 15 %
FEV1-%Pred-Post: 71 %
FEV1-%Pred-Pre: 61 %
FEV1-Post: 1.36 L
FEV1-Pre: 1.17 L
FEV1FVC-%Change-Post: 4 %
FEV1FVC-%Pred-Pre: 84 %
FEV6-%Change-Post: 11 %
FEV6-%Pred-Post: 85 %
FEV6-%Pred-Pre: 76 %
FEV6-Post: 2.04 L
FEV6-Pre: 1.83 L
FEV6FVC-%Change-Post: 0 %
FEV6FVC-%Pred-Post: 104 %
FEV6FVC-%Pred-Pre: 104 %
FVC-%Change-Post: 10 %
FVC-%Pred-Post: 81 %
FVC-%Pred-Pre: 73 %
FVC-Post: 2.05 L
FVC-Pre: 1.85 L
Post FEV1/FVC ratio: 66 %
Post FEV6/FVC ratio: 100 %
Pre FEV1/FVC ratio: 63 %
Pre FEV6/FVC Ratio: 99 %

## 2023-12-03 MED ORDER — STIOLTO RESPIMAT 2.5-2.5 MCG/ACT IN AERS: 2.0000 | INHALATION_SPRAY | Freq: Every day | RESPIRATORY_TRACT | 5 refills | Status: AC

## 2023-12-03 NOTE — Progress Notes (Signed)
 [name]    [id number]    04/02/1950  Primary Care Physician:Husain, Ardell, MD Date of Appointment: 12/03/2023 Established Patient Visit  Chief complaint:   Chief Complaint  Patient presents with   Shortness of Breath    Pft result.     HPI: [name] is a 73 y.o. woman with COPD  Interval Updates:  Here for follow up after PFTs and CT Chest.   Still short of breath with exertion, no worsening.   She has an albuterol  inhaler but hasn't been using it.   Father had pulmonary fibrosis diagnosed in his 39s. (IPF) He was also a smoker. Sister has NTM - Lady [place]'s Disease   No fevers or chills. She has night sweats 3-4 times/week for the last 3-4 months. She doesn't have much of an appetite which is chronic. No weight loss.   I have reviewed the patient's family social and past medical history and updated as appropriate.   Past Medical History:  Diagnosis Date   Anxiety    Arthritis    CAD (coronary artery disease)    a. s/p LCX stent 2010 with residual disease.   Carotid artery disease    Cataract    CHF (congestive heart failure) (HCC)    COPD (chronic obstructive pulmonary disease) (HCC)    Depression    GERD (gastroesophageal reflux disease)    Hepatitis C    History of carotid endarterectomy    Hyperlipidemia    borderline, I take a pill every other day   Hypertension    Noncompliance with medication regimen    Osteopenia    Polycythemia    Polycythemia vera (HCC)    PVD (peripheral vascular disease)    Seizures (HCC) 1970   x1- pt. reports that there was never any causative agent     TIA (transient ischemic attack)    UC (ulcerative colitis) (HCC)     Past Surgical History:  Procedure Laterality Date   2 stints  10/15/2008   Dr. Nanda   ABDOMINAL SURGERY  [phone]   for excessive bleeding from loss of pregnancy, required blood transfusion post procedure    CAROTID ENDARTERECTOMY Right 2007    cartoid endartherectomy  2007   CORONARY ANGIOPLASTY WITH STENT PLACEMENT  2010   CORONARY ANGIOPLASTY WITH STENT PLACEMENT  10/09/2008   ENDARTERECTOMY Left 07/24/2016   Procedure: ENDARTERECTOMY CAROTID;  Surgeon: Oris Krystal FALCON, MD;  Location: [facility] OR;  Service: Vascular;  Laterality: Left;   FLEXIBLE SIGMOIDOSCOPY N/A 03/22/2017   Procedure: ENID MORIN;  Surgeon: Rollin Dover, MD;  Location: [facility] ENDOSCOPY;  Service: Endoscopy;  Laterality: N/A;   LYMPH NODE DISSECTION Left 1990's   benign    ORIF ELBOW FRACTURE Right 04/26/2018   Procedure: OPEN REDUCTION INTERNAL FIXATION (ORIF) ELBOW/OLECRANON FRACTURE;  Surgeon: Kendal Franky SQUIBB, MD;  Location: [facility] OR;  Service: Orthopedics;  Laterality: Right;   TUBAL LIGATION      Family History  Problem Relation Age of Onset   Heart disease Mother    Non-Hodgkin's lymphoma Father        and lung cancer   Heart disease Father    Liver disease Neg Hx    Esophageal cancer Neg Hx    Colon cancer Neg Hx    Rectal cancer Neg Hx    Stomach cancer Neg Hx     Social History   Occupational History   Not on file  Tobacco Use  Smoking status: Some Days    Current packs/day: 0.50    Average packs/day: 1.5 packs/day for 57.8 years (85.9 ttl pk-yrs)    Types: Cigarettes, E-cigarettes    Start date: 1968    Last attempt to quit: 2025    Passive exposure: Never   Smokeless tobacco: Current   Tobacco comments:    Vape and occasional smoking   Vaping Use   Vaping status: Some Days   Start date: 08/07/2020   Substances: Nicotine   Substance and Sexual Activity   Alcohol use: Not Currently    Comment: socially occasionally   Drug use: Not Currently    Comment: has smoked cigarettes off and on   Sexual activity: Not Currently     Physical Exam: Blood pressure (!) 142/74, pulse (!) 53, temperature 97.9 F (36.6 C), temperature source Oral, height 5' 1 (1.549 m), weight 119 lb (54 kg), SpO2 96%.  Gen:      No acute distress ENT:   no nasal polyps, mucus membranes moist Lungs:    No increased respiratory effort, symmetric chest wall excursion, clear to auscultation bilaterally, no wheezes or crackles CV:         Regular rate and rhythm; no murmurs, rubs, or gallops.  No pedal edema   Data Reviewed: Imaging: I have personally reviewed the CT Chest July 2025 which shows some upper lobe predominant cluster nodules likely atypical infection/ntm. Possible mild subpleural reticulation.   PFTs:     Latest Ref Rng & Units 12/03/2023   12:59 PM  PFT Results  FVC-Pre L 1.85  P  FVC-Predicted Pre % 73  P  FVC-Post L 2.05  P  FVC-Predicted Post % 81  P  Pre FEV1/FVC % % 63  P  Post FEV1/FCV % % 66  P  FEV1-Pre L 1.17  P  FEV1-Predicted Pre % 61  P  FEV1-Post L 1.36  P  DLCO uncorrected ml/min/mmHg 10.51  P  DLCO UNC% % 60  P  DLCO corrected ml/min/mmHg 10.35  P  DLCO COR %Predicted % 59  P  DLVA Predicted % 59  P    P Preliminary result   I have personally reviewed the patient's PFTs and moderately severe COPD FEV1 61%  Labs: Lab Results  Component Value Date   NA 131 (L) 11/14/2023   K 4.9 11/14/2023   CO2 30 11/14/2023   GLUCOSE 108 (H) 11/14/2023   BUN 20 11/14/2023   CREATININE 0.79 11/14/2023   CALCIUM  9.6 11/14/2023   GFR 67.06 06/23/2010   EGFR 70 (L) 12/28/2015   GFRNONAA >60 11/14/2023   Lab Results  Component Value Date   WBC 6.3 11/14/2023   HGB 13.9 11/14/2023   HCT 40.6 11/14/2023   MCV 92.9 11/14/2023   PLT 281 11/14/2023    Immunization status: Immunization History  Administered Date(s) Administered   Influenza Split 04/01/2012   Influenza Whole 01/06/2009   Influenza,inj,Quad PF,6+ Mos 01/05/2014   Influenza-Unspecified 11/27/2015   PFIZER(Purple Top)SARS-COV-2 Vaccination 03/27/2019, 04/21/2019    External Records Personally Reviewed:   Assessment:  Moderately Severe COPD FEV1 61% Pulmonary nodules Tobacco use disorder   Plan/Recommendations:   Breathing testing  is consistent with moderate COPD. We are going to start a new inhaler called Stiolto, take 2 puffs in the morning every day. In between then if you get short of breath you can take the albuterol  inhaler up to 4 times a day as needed for chest tightness, wheezing, shortness of breath.  The  main side effect is a fast heart rate if you take it many times.  Your CT chest shows changes consistent with COPD.  You have some tiny nodules in the top parts of your lungs that could be nontuberculous mycobacterial disease which is seen in lady [place] syndrome.  Right now you are not having any symptoms so we will just keep an eye on it.  I do not think you have any signs of pulmonary fibrosis based on your exam and CT chest.  Because of your smoking history you do meet criteria for lung cancer screening.  You would next be due for scan in July 2026.   I spent 30 minutes on 12/03/2023 in care of this patient including face to face time and non-face to face time spent charting, review of outside records, and coordination of care.   Return to Care: Return in about 3 months (around 03/04/2024) for Dr. Pleas.   Verdon Gore, MD Pulmonary and Critical Care Medicine [facility] [phone]

## 2023-12-03 NOTE — Patient Instructions (Signed)
 Full PFT performed today.

## 2023-12-03 NOTE — Progress Notes (Signed)
 Full PFT performed today.

## 2023-12-03 NOTE — Patient Instructions (Addendum)
 It was a pleasure to see you today!  Please schedule follow up with Dr Pleas in 3 months.  If my schedule is not open yet, we will contact you with a reminder closer to that time. Please call [phone] if you haven't heard from us  a month before, and always call us  sooner if issues or concerns arise. You can also send us  a message through MyChart, but but aware that this is not to be used for urgent issues and it may take up to 5-7 days to receive a reply. Please be aware that you will likely be able to view your results before I have a chance to respond to them. Please give us  5 business days to respond to any non-urgent results.   Breathing testing is consistent with moderate COPD. We are going to start a new inhaler called Stiolto, take 2 puffs in the morning every day. In between then if you get short of breath you can take the albuterol  inhaler up to 4 times a day as needed for chest tightness, wheezing, shortness of breath.  The main side effect is a fast heart rate if you take it many times.  Your CT chest shows changes consistent with COPD.  You have some tiny nodules in the top parts of your lungs that could be nontuberculous mycobacterial disease which is seen in lady [place]inder[place] syndrome.  Right now you are not having any symptoms so we will just keep an eye on it.  I do not think you have any signs of pulmonary fibrosis based on your exam and CT chest.  Because of your smoking history you do meet criteria for lung cancer screening.  You would next be due for scan in July 2026.

## 2023-12-04 ENCOUNTER — Other Ambulatory Visit (HOSPITAL_COMMUNITY): Payer: Self-pay

## 2023-12-04 ENCOUNTER — Encounter: Payer: Self-pay | Admitting: Hematology & Oncology

## 2023-12-10 ENCOUNTER — Other Ambulatory Visit: Payer: Self-pay | Admitting: Cardiovascular Disease

## 2023-12-10 ENCOUNTER — Ambulatory Visit: Payer: Self-pay | Admitting: Cardiovascular Disease

## 2023-12-10 ENCOUNTER — Ambulatory Visit (HOSPITAL_COMMUNITY)
Admission: RE | Admit: 2023-12-10 | Discharge: 2023-12-10 | Disposition: A | Discharge status: Home / Self Care | Source: Ambulatory Visit | Attending: Cardiovascular Disease | Admitting: Cardiovascular Disease

## 2023-12-10 DIAGNOSIS — Z720 Tobacco use: Secondary | ICD-10-CM | POA: Insufficient documentation

## 2023-12-10 DIAGNOSIS — E785 Hyperlipidemia, unspecified: Secondary | ICD-10-CM | POA: Diagnosis not present

## 2023-12-10 DIAGNOSIS — I739 Peripheral vascular disease, unspecified: Secondary | ICD-10-CM | POA: Insufficient documentation

## 2023-12-10 DIAGNOSIS — I25119 Atherosclerotic heart disease of native coronary artery with unspecified angina pectoris: Secondary | ICD-10-CM | POA: Insufficient documentation

## 2023-12-10 DIAGNOSIS — I10 Essential (primary) hypertension: Secondary | ICD-10-CM

## 2023-12-11 ENCOUNTER — Ambulatory Visit (HOSPITAL_COMMUNITY)
Admission: RE | Admit: 2023-12-11 | Discharge: 2023-12-11 | Disposition: A | Discharge status: Home / Self Care | Source: Ambulatory Visit | Attending: Cardiology | Admitting: Cardiology

## 2023-12-11 DIAGNOSIS — E785 Hyperlipidemia, unspecified: Secondary | ICD-10-CM

## 2023-12-11 DIAGNOSIS — Z720 Tobacco use: Secondary | ICD-10-CM

## 2023-12-11 DIAGNOSIS — I10 Essential (primary) hypertension: Secondary | ICD-10-CM

## 2023-12-11 DIAGNOSIS — I25119 Atherosclerotic heart disease of native coronary artery with unspecified angina pectoris: Secondary | ICD-10-CM

## 2023-12-11 LAB — VAS US ABI WITH/WO TBI
Left ABI: 1.02
Right ABI: 0.74

## 2023-12-13 ENCOUNTER — Ambulatory Visit (HOSPITAL_COMMUNITY)
Admission: RE | Admit: 2023-12-13 | Discharge: 2023-12-13 | Disposition: A | Discharge status: Home / Self Care | Source: Ambulatory Visit | Attending: Cardiovascular Disease | Admitting: Cardiovascular Disease

## 2023-12-13 DIAGNOSIS — I25119 Atherosclerotic heart disease of native coronary artery with unspecified angina pectoris: Secondary | ICD-10-CM | POA: Insufficient documentation

## 2023-12-13 DIAGNOSIS — I10 Essential (primary) hypertension: Secondary | ICD-10-CM | POA: Insufficient documentation

## 2023-12-13 DIAGNOSIS — Z720 Tobacco use: Secondary | ICD-10-CM | POA: Insufficient documentation

## 2023-12-13 DIAGNOSIS — E785 Hyperlipidemia, unspecified: Secondary | ICD-10-CM | POA: Diagnosis not present

## 2023-12-13 DIAGNOSIS — I700 Atherosclerosis of aorta: Secondary | ICD-10-CM | POA: Diagnosis not present

## 2023-12-13 DIAGNOSIS — M47814 Spondylosis without myelopathy or radiculopathy, thoracic region: Secondary | ICD-10-CM | POA: Diagnosis not present

## 2023-12-13 LAB — MYOCARDIAL PERFUSION IMAGING
LV dias vol: 80 mL (ref 46–106)
LV sys vol: 40 mL (ref 3.8–5.2)
Nuc Stress EF: 59 %
Peak HR: 79 {beats}/min
Rest HR: 55 {beats}/min
Rest Nuclear Isotope Dose: 10.9 mCi
SDS: 7
SRS: 0
SSS: 7
ST Depression (mm): 1 mm
Stress Nuclear Isotope Dose: 32.5 mCi
TID: 1.31

## 2023-12-13 MED ORDER — REGADENOSON 0.4 MG/5ML IV SOLN
0.4000 mg | Freq: Once | INTRAVENOUS | Status: AC
  Administered 2023-12-13: 0.4 mg via INTRAVENOUS

## 2023-12-13 MED ORDER — TECHNETIUM TC 99M TETROFOSMIN IV KIT
10.9000 | PACK | Freq: Once | INTRAVENOUS | Status: AC | PRN
  Administered 2023-12-13: 10.9 via INTRAVENOUS

## 2023-12-13 MED ORDER — REGADENOSON 0.4 MG/5ML IV SOLN
INTRAVENOUS | Status: AC
  Filled 2023-12-13: qty 5

## 2023-12-13 MED ORDER — TECHNETIUM TC 99M TETROFOSMIN IV KIT
32.5000 | PACK | Freq: Once | INTRAVENOUS | Status: AC | PRN
Start: 2023-12-13 — End: 2023-12-13
  Administered 2023-12-13: 32.5 via INTRAVENOUS

## 2023-12-15 ENCOUNTER — Ambulatory Visit: Payer: Self-pay | Admitting: Cardiovascular Disease

## 2023-12-19 ENCOUNTER — Ambulatory Visit (HOSPITAL_COMMUNITY)
Admission: RE | Admit: 2023-12-19 | Discharge: 2023-12-19 | Disposition: A | Discharge status: Home / Self Care | Source: Ambulatory Visit | Attending: Cardiovascular Disease | Admitting: Cardiovascular Disease

## 2023-12-19 ENCOUNTER — Ambulatory Visit (HOSPITAL_BASED_OUTPATIENT_CLINIC_OR_DEPARTMENT_OTHER)
Admission: RE | Admit: 2023-12-19 | Discharge: 2023-12-19 | Disposition: A | Discharge status: Home / Self Care | Source: Ambulatory Visit | Attending: Cardiovascular Disease | Admitting: Cardiovascular Disease

## 2023-12-19 DIAGNOSIS — I739 Peripheral vascular disease, unspecified: Secondary | ICD-10-CM | POA: Insufficient documentation

## 2023-12-20 ENCOUNTER — Ambulatory Visit: Payer: Self-pay | Admitting: Cardiovascular Disease

## 2023-12-20 ENCOUNTER — Telehealth: Payer: Self-pay | Admitting: Cardiovascular Disease

## 2023-12-20 NOTE — Telephone Encounter (Signed)
 Patient wants a call back to get vascular test results.

## 2023-12-20 NOTE — Telephone Encounter (Signed)
 Returned patient's call regarding vascular studies results. Patient states she received a phone call last Friday telling her she had decreased blood flow in her leg and was also advised that her stress test was positive. She verbalizes that an appt was made for her with Dr. Court on 12/27/23. She states she does not understand if she needs immediate attention or if she should wait for the visit with Dr. Court. Explained that Dr. Court advised in-person follow up with him as soon as possible to go over diagnosis and treatment plan, and that he would have sent her to the ED if she needed immediate intervention. Reviewed ED precautions, patient verbalizes understanding to go to the ED if she experiences chest pain/SOB that does not get better after 3 doses of nitroglycerin  5 mins apart or if leg turns cold/blue. Patient requests that Dr. Court or a colleague call her back to discuss prior to 12/27/23 as she feels scared and nervous even though patient verbalizes understanding that Dr. Court is out of town. Forwarded to Dr. Court for response.

## 2023-12-21 NOTE — Telephone Encounter (Addendum)
 Left message for patient advising that this will be discussed with her at her appointment next week. Advised to call back with any further questions or concerns.

## 2023-12-26 ENCOUNTER — Encounter: Payer: Self-pay | Admitting: Internal Medicine

## 2023-12-27 ENCOUNTER — Ambulatory Visit: Attending: Cardiovascular Disease | Admitting: Cardiovascular Disease

## 2023-12-27 ENCOUNTER — Ambulatory Visit: Admitting: Pharmacist

## 2023-12-27 ENCOUNTER — Encounter: Payer: Self-pay | Admitting: Cardiovascular Disease

## 2023-12-27 VITALS — BP 180/75 | HR 61 | Ht 61.0 in | Wt 116.0 lb

## 2023-12-27 DIAGNOSIS — I739 Peripheral vascular disease, unspecified: Secondary | ICD-10-CM

## 2023-12-27 DIAGNOSIS — I2510 Atherosclerotic heart disease of native coronary artery without angina pectoris: Secondary | ICD-10-CM | POA: Diagnosis not present

## 2023-12-27 DIAGNOSIS — Z9861 Coronary angioplasty status: Secondary | ICD-10-CM

## 2023-12-27 DIAGNOSIS — I10 Essential (primary) hypertension: Secondary | ICD-10-CM

## 2023-12-27 DIAGNOSIS — I25119 Atherosclerotic heart disease of native coronary artery with unspecified angina pectoris: Secondary | ICD-10-CM | POA: Diagnosis not present

## 2023-12-27 NOTE — Assessment & Plan Note (Signed)
 History of CAD status post circumflex PCI in 2010.  She has been having nitrate responsive angina recently.  A Myoview  stress test performed 12/13/2023 was abnormal with ischemia in an obtuse marginal branch of posterolateral territory.  Based on this I recommended that we proceed with outpatient diagnostic coronary angiography.

## 2023-12-27 NOTE — Progress Notes (Signed)
 12/27/2023 [name]   14-Jul-1950  [id number]  Primary Physician Ransom Other, MD Primary Cardiologist: Dorn JINNY Lesches MD [place], [place]  HPI:  [name] is a 73 y.o.   thin-appearing divorced Caucasian female mother of 2, grandmother of 5 grandchildren who is retired from being an art therapist at [employer].  She was formally a patient of Dr. Esmeralda Sharps and then Dr. Dann.  I am assuming her care in their absence.  I last saw her in the office 11/21/2023.  She is accompanied by her cousin [name] today..  She has a history of ongoing tobacco abuse of 1 pack/day for the last 40 years off and on.  She has treated hypertension hyperlipidemia.  She is never had a heart attack or stroke.  She had coronary artery stenting in 2010, bilateral carotid endarterectomy in 2007 and 2018 followed by her PCP.  She also has P vera followed by hematologist Dr. Timmy.  She really is not very active.  She admits to being depressed and bays being dizzy occasionally.  1 week ago she had an episode which sounds like a TIA with numbness on the left side of her body.  She did check her blood pressure at the time it was 87/45.  Later that night her blood pressure had increased and her symptoms had resolved.   She has been complaining of occasional chest pain requiring sublingual nitroglycerin .  She also complains of bilateral upper thigh discomfort when ambulating.  Since I saw her a month ago I did get a Myoview  stress test 12/13/2023 that was intermediate risk consistent with ischemia in the circumflex marginal or posterolateral branch.  Lower extremities show Doppler studies suggest a high-grade right common iliac artery stenosis with a right ABI of 0.74.  I am going to proceed with outpatient diagnostic coronary angiography to determine her coronary anatomy and address that primarily after which we will focus on her lower extremities.   Current Meds   Medication Sig   acetaminophen  (TYLENOL ) 325 MG tablet Take 325-650 mg by mouth every 6 (six) hours as needed for mild pain or headache.   albuterol  (VENTOLIN  HFA) 108 (90 Base) MCG/ACT inhaler Inhale into the lungs.   ALPRAZolam  (XANAX ) 0.5 MG tablet Take 0.5 mg by mouth at bedtime.    Ascorbic Acid (VITAMIN C PO) Take 2 tablets by mouth daily.   clopidogrel  (PLAVIX ) 75 MG tablet Take 1 tablet by mouth once daily   clotrimazole -betamethasone  (LOTRISONE ) cream Apply 1 application  topically 2 (two) times daily as needed (to affected area).   escitalopram  (LEXAPRO ) 10 MG tablet Take 10 mg by mouth daily. Patient is taking 5 MG daily   hydrochlorothiazide  (HYDRODIURIL ) 12.5 MG tablet Take 12.5 mg by mouth daily.   lansoprazole  (PREVACID ) 30 MG capsule Take 30 mg by mouth daily as needed (for ulcer/acid reflex). Ulcer/ acid reflux   metoprolol  succinate (TOPROL -XL) 100 MG 24 hr tablet TAKE 1 & 1/2 (ONE & ONE-HALF) TABLETS BY MOUTH ONCE DAILY . Please make yearly appt with Dr. Sharps for May for future refills. 1st attempt   nitroGLYCERIN  (NITROSTAT ) 0.4 MG SL tablet    rosuvastatin  (CRESTOR ) 10 MG tablet Take 1 tablet (10 mg total) by mouth daily. (Patient taking differently: Take 10 mg by mouth daily. 2-3 times a week)   telmisartan  (MICARDIS ) 80 MG tablet Take 1 tablet (80 mg total) by mouth daily.   Tiotropium Bromide-Olodaterol (STIOLTO RESPIMAT) 2.5-2.5 MCG/ACT AERS Inhale 2  puffs into the lungs daily.   Current Facility-Administered Medications for the 12/27/23 encounter (Office Visit) with Court Dorn PARAS, MD  Medication   0.9 %  sodium chloride  infusion     Allergies  Allergen Reactions   Diphenhydramine Hives    Reaction was many years ago, I have taken one since for a bee sting and had no issues.   Sulfa  Antibiotics Nausea And Vomiting   Sulfur Nausea Only   Amlodipine  Besylate Other (See Comments)    headaches   Nsaids Other (See Comments)    She doesn't take Nsaids because  it runs up her BP.   Sulfamethoxazole -Trimethoprim  Nausea And Vomiting    Social History   Socioeconomic History   Marital status: Single    Spouse name: Not on file   Number of children: 2   Years of education: Not on file   Highest education level: Not on file  Occupational History   Not on file  Tobacco Use   Smoking status: Some Days    Current packs/day: 0.50    Average packs/day: 1.5 packs/day for 57.8 years (85.9 ttl pk-yrs)    Types: Cigarettes, E-cigarettes    Start date: 1968    Last attempt to quit: 2025    Passive exposure: Never   Smokeless tobacco: Current   Tobacco comments:    Vape and occasional smoking   Vaping Use   Vaping status: Some Days   Start date: 08/07/2020   Substances: Nicotine   Substance and Sexual Activity   Alcohol use: Not Currently    Comment: socially occasionally   Drug use: Not Currently    Comment: has smoked cigarettes off and on   Sexual activity: Not Currently  Other Topics Concern   Not on file  Social History Narrative   ** Merged History Encounter **       ** Merged History Encounter **       Tobacco use cigarettes: Current smoker Frequency 1 PPD Estimated Pack - years :30 Smoking : yes  No Tobacco exposure No alcohol No exercise Occupation : employed, works at home for [employer], Engineer, Maintenance (it)   n Boys 1 Girls 1   Social Drivers of Corporate Investment Banker Strain: Not on file  Food Insecurity: No Food Insecurity (07/17/2019)   Hunger Vital Sign    Worried About Running Out of Food in the Last Year: Never true    Ran Out of Food in the Last Year: Never true  Transportation Needs: Not on file  Physical Activity: Not on file  Stress: Not on file  Social Connections: Not on file  Intimate Partner Violence: Not on file     Review of Systems: General: negative for chills, fever, night sweats or weight changes.  Cardiovascular: negative for chest pain, dyspnea on  exertion, edema, orthopnea, palpitations, paroxysmal nocturnal dyspnea or shortness of breath Dermatological: negative for rash Respiratory: negative for cough or wheezing Urologic: negative for hematuria Abdominal: negative for nausea, vomiting, diarrhea, bright red blood per rectum, melena, or hematemesis Neurologic: negative for visual changes, syncope, or dizziness All other systems reviewed and are otherwise negative except as noted above.    Blood pressure (!) 180/75, pulse 61, height 5' 1 (1.549 m), weight 116 lb (52.6 kg), SpO2 96%.  General appearance: alert and no distress Neck: no adenopathy, no carotid bruit, no JVD, supple, symmetrical, trachea midline, and thyroid  not enlarged, symmetric, no tenderness/mass/nodules Lungs: clear to auscultation bilaterally Heart: regular rate and  rhythm, S1, S2 normal, no murmur, click, rub or gallop Extremities: extremities normal, atraumatic, no cyanosis or edema Pulses: Decreased right pedal pulse Skin: Skin color, texture, turgor normal. No rashes or lesions Neurologic: Grossly normal  EKG EKG Interpretation Date/Time:  Thursday December 27 2023 14:21:43 EST Ventricular Rate:  59 PR Interval:  200 QRS Duration:  82 QT Interval:  436 QTC Calculation: 431 R Axis:   72  Text Interpretation: Sinus bradycardia When compared with ECG of 21-Nov-2023 14:01, No significant change was found Confirmed by Court Carrier [phone]) on 12/27/2023 3:07:13 PM    ASSESSMENT AND PLAN:   CAD S/P percutaneous coronary angioplasty History of CAD status post circumflex PCI in 2010.  She has been having nitrate responsive angina recently.  A Myoview  stress test performed 12/13/2023 was abnormal with ischemia in an obtuse marginal branch of posterolateral territory.  Based on this I recommended that we proceed with outpatient diagnostic coronary angiography.  Claudication in peripheral vascular disease Patient has been complaining of claudication right  greater than left.  She did have Doppler studies performed 12/19/2023 suggesting a high-grade lesion in the proximal right common iliac artery with monophasic waveforms below that.  Her right ABI is 0.74 and left was 1.02.  She will need peripheral angiography after we resolve her coronary issues.     Carrier DOROTHA Court MD FACP,FACC,FAHA, [facility] 12/27/2023 3:21 PM

## 2023-12-27 NOTE — H&P (View-Only) (Signed)
 12/27/2023 [name]   14-Jul-1950  [id number]  Primary Physician Ransom Other, MD Primary Cardiologist: Dorn Amber Lesches MD [name], [place]  HPI:  [name] is a 73 y.o.   thin-appearing divorced Caucasian female mother of 2, grandmother of 5 grandchildren who is retired from being an art therapist at [employer].  She was formally a patient of Dr. Esmeralda [name] and then Dr. Dann.  I am assuming her care in their absence.  I last saw her in the office 11/21/2023.  She is accompanied by her cousin [name] today..  She has a history of ongoing tobacco abuse of 1 pack/day for the last 40 years off and on.  She has treated hypertension hyperlipidemia.  She is never had a heart attack or stroke.  She had coronary artery stenting in 2010, bilateral carotid endarterectomy in 2007 and 2018 followed by her PCP.  She also has P vera followed by hematologist Dr. Timmy.  She really is not very active.  She admits to being depressed and bays being dizzy occasionally.  1 week ago she had an episode which sounds like a TIA with numbness on the left side of her body.  She did check her blood pressure at the time it was 87/45.  Later that night her blood pressure had increased and her symptoms had resolved.   She has been complaining of occasional chest pain requiring sublingual nitroglycerin .  She also complains of bilateral upper thigh discomfort when ambulating.  Since I saw her a month ago I did get a Myoview  stress test 12/13/2023 that was intermediate risk consistent with ischemia in the circumflex marginal or posterolateral branch.  Lower extremities show Doppler studies suggest a high-grade right common iliac artery stenosis with a right ABI of 0.74.  I am going to proceed with outpatient diagnostic coronary angiography to determine her coronary anatomy and address that primarily after which we will focus on her lower extremities.   Current Meds   Medication Sig   acetaminophen  (TYLENOL ) 325 MG tablet Take 325-650 mg by mouth every 6 (six) hours as needed for mild pain or headache.   albuterol  (VENTOLIN  HFA) 108 (90 Base) MCG/ACT inhaler Inhale into the lungs.   ALPRAZolam  (XANAX ) 0.5 MG tablet Take 0.5 mg by mouth at bedtime.    Ascorbic Acid (VITAMIN C PO) Take 2 tablets by mouth daily.   clopidogrel  (PLAVIX ) 75 MG tablet Take 1 tablet by mouth once daily   clotrimazole -betamethasone  (LOTRISONE ) cream Apply 1 application  topically 2 (two) times daily as needed (to affected area).   escitalopram  (LEXAPRO ) 10 MG tablet Take 10 mg by mouth daily. Patient is taking 5 MG daily   hydrochlorothiazide  (HYDRODIURIL ) 12.5 MG tablet Take 12.5 mg by mouth daily.   lansoprazole  (PREVACID ) 30 MG capsule Take 30 mg by mouth daily as needed (for ulcer/acid reflex). Ulcer/ acid reflux   metoprolol  succinate (TOPROL -XL) 100 MG 24 hr tablet TAKE 1 & 1/2 (ONE & ONE-HALF) TABLETS BY MOUTH ONCE DAILY . Please make yearly appt with Dr. Sharps for May for future refills. 1st attempt   nitroGLYCERIN  (NITROSTAT ) 0.4 MG SL tablet    rosuvastatin  (CRESTOR ) 10 MG tablet Take 1 tablet (10 mg total) by mouth daily. (Patient taking differently: Take 10 mg by mouth daily. 2-3 times a week)   telmisartan  (MICARDIS ) 80 MG tablet Take 1 tablet (80 mg total) by mouth daily.   Tiotropium Bromide-Olodaterol (STIOLTO RESPIMAT) 2.5-2.5 MCG/ACT AERS Inhale 2  puffs into the lungs daily.   Current Facility-Administered Medications for the 12/27/23 encounter (Office Visit) with Amber Dorn PARAS, MD  Medication   0.9 %  sodium chloride  infusion     Allergies  Allergen Reactions   Diphenhydramine Hives    Reaction was many years ago, I have taken one since for a bee sting and had no issues.   Sulfa  Antibiotics Nausea And Vomiting   Sulfur Nausea Only   Amlodipine  Besylate Other (See Comments)    headaches   Nsaids Other (See Comments)    She doesn't take Nsaids because  it runs up her BP.   Sulfamethoxazole -Trimethoprim  Nausea And Vomiting    Social History   Socioeconomic History   Marital status: Single    Spouse name: Not on file   Number of children: 2   Years of education: Not on file   Highest education level: Not on file  Occupational History   Not on file  Tobacco Use   Smoking status: Some Days    Current packs/day: 0.50    Average packs/day: 1.5 packs/day for 57.8 years (85.9 ttl pk-yrs)    Types: Cigarettes, E-cigarettes    Start date: 1968    Last attempt to quit: 2025    Passive exposure: Never   Smokeless tobacco: Current   Tobacco comments:    Vape and occasional smoking   Vaping Use   Vaping status: Some Days   Start date: 08/07/2020   Substances: Nicotine   Substance and Sexual Activity   Alcohol use: Not Currently    Comment: socially occasionally   Drug use: Not Currently    Comment: has smoked cigarettes off and on   Sexual activity: Not Currently  Other Topics Concern   Not on file  Social History Narrative   ** Merged History Encounter **       ** Merged History Encounter **       Tobacco use cigarettes: Current smoker Frequency 1 PPD Estimated Pack - years :30 Smoking : yes  No Tobacco exposure No alcohol No exercise Occupation : employed, works at home for [employer], Engineer, Maintenance (it)   n Boys 1 Girls 1   Social Drivers of Corporate Investment Banker Strain: Not on file  Food Insecurity: No Food Insecurity (07/17/2019)   Hunger Vital Sign    Worried About Running Out of Food in the Last Year: Never true    Ran Out of Food in the Last Year: Never true  Transportation Needs: Not on file  Physical Activity: Not on file  Stress: Not on file  Social Connections: Not on file  Intimate Partner Violence: Not on file     Review of Systems: General: negative for chills, fever, night sweats or weight changes.  Cardiovascular: negative for chest pain, dyspnea on  exertion, edema, orthopnea, palpitations, paroxysmal nocturnal dyspnea or shortness of breath Dermatological: negative for rash Respiratory: negative for cough or wheezing Urologic: negative for hematuria Abdominal: negative for nausea, vomiting, diarrhea, bright red blood per rectum, melena, or hematemesis Neurologic: negative for visual changes, syncope, or dizziness All other systems reviewed and are otherwise negative except as noted above.    Blood pressure (!) 180/75, pulse 61, height 5' 1 (1.549 m), weight 116 lb (52.6 kg), SpO2 96%.  General appearance: alert and no distress Neck: no adenopathy, no carotid bruit, no JVD, supple, symmetrical, trachea midline, and thyroid  not enlarged, symmetric, no tenderness/mass/nodules Lungs: clear to auscultation bilaterally Heart: regular rate and  rhythm, S1, S2 normal, no murmur, click, rub or gallop Extremities: extremities normal, atraumatic, no cyanosis or edema Pulses: Decreased right pedal pulse Skin: Skin color, texture, turgor normal. No rashes or lesions Neurologic: Grossly normal  EKG EKG Interpretation Date/Time:  Thursday December 27 2023 14:21:43 EST Ventricular Rate:  59 PR Interval:  200 QRS Duration:  82 QT Interval:  436 QTC Calculation: 431 R Axis:   72  Text Interpretation: Sinus bradycardia When compared with ECG of 21-Nov-2023 14:01, No significant change was found Confirmed by Amber Carrier [phone]) on 12/27/2023 3:07:13 PM    ASSESSMENT AND PLAN:   CAD S/P percutaneous coronary angioplasty History of CAD status post circumflex PCI in 2010.  She has been having nitrate responsive angina recently.  A Myoview  stress test performed 12/13/2023 was abnormal with ischemia in an obtuse marginal branch of posterolateral territory.  Based on this I recommended that we proceed with outpatient diagnostic coronary angiography.  Claudication in peripheral vascular disease Patient has been complaining of claudication right  greater than left.  She did have Doppler studies performed 12/19/2023 suggesting a high-grade lesion in the proximal right common iliac artery with monophasic waveforms below that.  Her right ABI is 0.74 and left was 1.02.  She will need peripheral angiography after we resolve her coronary issues.     Carrier DOROTHA Court MD FACP,FACC,FAHA, [facility] 12/27/2023 3:21 PM

## 2023-12-27 NOTE — Assessment & Plan Note (Signed)
 Patient has been complaining of claudication right greater than left.  She did have Doppler studies performed 12/19/2023 suggesting a high-grade lesion in the proximal right common iliac artery with monophasic waveforms below that.  Her right ABI is 0.74 and left was 1.02.  She will need peripheral angiography after we resolve her coronary issues.

## 2023-12-27 NOTE — Patient Instructions (Signed)
 Medication Instructions:  Your physician recommends that you continue on your current medications as directed. Please refer to the Current Medication list given to you today.  *If you need a refill on your cardiac medications before your next appointment, please call your pharmacy*  Lab Work: Your physician recommends that you have labs drawn today: BMET & CBC  If you have labs (blood work) drawn today and your tests are completely normal, you will receive your results only by: MyChart Message (if you have MyChart) OR A paper copy in the mail If you have any lab test that is abnormal or we need to change your treatment, we will call you to review the results.  Testing/Procedures: See below  Follow-Up: At [facility], you and your health needs are our priority.  As part of our continuing mission to provide you with exceptional heart care, our providers are all part of one team.  This team includes your primary Cardiologist (physician) and Advanced Practice Providers or APPs (Physician Assistants and Nurse Practitioners) who all work together to provide you with the care you need, when you need it.  Your next appointment:   2-3 week(s) after your procedure (11/13)  Provider:   Dorn Lesches, MD    We recommend signing up for the patient portal called MyChart.  Sign up information is provided on this After Visit Summary.  MyChart is used to connect with patients for Virtual Visits (Telemedicine).  Patients are able to view lab/test results, encounter notes, upcoming appointments, etc.  Non-urgent messages can be sent to your provider as well.   To learn more about what you can do with MyChart, go to [email or web address].   Other Instructions       Cardiac/Peripheral Catheterization   You are scheduled for a Cardiac Catheterization on Thursday, November 13 with Dr. Deatrice Cage.  1. Please arrive at the [facility] (Main Entrance A) at [facility]: [address] [place], [place] [address] at 7:00 AM (This time is 2 hour(s) before your procedure to ensure your preparation).   Free valet parking service is available. You will check in at ADMITTING. The support person will be asked to wait in the waiting room.  It is OK to have someone drop you off and come back when you are ready to be discharged.        Special note: Every effort is made to have your procedure done on time. Please understand that emergencies sometimes delay scheduled procedures.  2. Diet: Nothing to eat after midnight.  3. Hydration:You need to be well hydrated before your procedure. On November 13, you may drink approved liquids (see below) until 2 hours before the procedure, with 16 oz of water as your last intake.   List of approved liquids water, clear juice, clear tea, black coffee, fruit juices, non-citric and without pulp, carbonated beverages, Gatorade, Kool -Aid, plain Jello-O and plain ice popsicles.  4. Labs: You will need to have blood drawn today (11/6).  5. Medication instructions in preparation for your procedure:    Stop taking, Telemisartan (micardis ) Thursday, November 13,, HTCZ (Hydrochlorothiazide ) Thursday, November 13,    On the morning of your procedure, take Aspirin  81 mg and Plavix /Clopidogrel  and any morning medicines NOT listed above.  You may use sips of water.  6. Plan to go home the same day, you will only stay overnight if medically necessary. 7. You MUST have a responsible adult to drive you home. 8. An adult MUST be  with you the first 24 hours after you arrive home. 9. Bring a current list of your medications, and the last time and date medication taken. 10. Bring ID and current insurance cards. 11.Please wear clothes that are easy to get on and off and wear slip-on shoes.  Thank you for allowing us  to care for you!   -- [facility]

## 2023-12-28 ENCOUNTER — Ambulatory Visit: Payer: Self-pay | Admitting: Cardiovascular Disease

## 2023-12-28 ENCOUNTER — Other Ambulatory Visit: Payer: Self-pay | Admitting: Cardiovascular Disease

## 2023-12-28 DIAGNOSIS — I25119 Atherosclerotic heart disease of native coronary artery with unspecified angina pectoris: Secondary | ICD-10-CM

## 2023-12-28 LAB — BASIC METABOLIC PANEL WITH GFR
BUN/Creatinine Ratio: 15 (ref 12–28)
BUN: 15 mg/dL (ref 8–27)
CO2: 26 mmol/L (ref 20–29)
Calcium: 10.1 mg/dL (ref 8.7–10.3)
Chloride: 92 mmol/L — ABNORMAL LOW (ref 96–106)
Creatinine, Ser: 0.98 mg/dL (ref 0.57–1.00)
Glucose: 85 mg/dL (ref 70–99)
Potassium: 4.3 mmol/L (ref 3.5–5.2)
Sodium: 134 mmol/L (ref 134–144)
eGFR: 61 mL/min/1.73 (ref >59)

## 2023-12-28 LAB — CBC
Hematocrit: 39.6 % (ref 34.0–46.6)
Hemoglobin: 13.3 g/dL (ref 11.1–15.9)
MCH: 31.7 pg (ref 26.6–33.0)
MCHC: 33.6 g/dL (ref 31.5–35.7)
MCV: 95 fL (ref 79–97)
Platelets: 290 x10E3/uL (ref 150–450)
RBC: 4.19 x10E6/uL (ref 3.77–5.28)
RDW: 11.7 % (ref 11.7–15.4)
WBC: 5.8 x10E3/uL (ref 3.4–10.8)

## 2024-01-01 ENCOUNTER — Telehealth: Payer: Self-pay | Admitting: *Deleted

## 2024-01-01 NOTE — Telephone Encounter (Addendum)
 Cardiac Catheterization scheduled at [facility] for: Thursday January 03, 2024 9 AM Arrival time [facility] Main Entrance A at: 7 AM  Diet: -Nothing to eat after midnight.  Hydration: -May drink clear liquids until 2 hours before the procedure.  Approved liquids: Water, clear tea, black coffee, fruit juices-non-citric and without pulp,Gatorade, plain Jello/popsicles.   -Please drink 16 oz of water 2 hours before procedure.  Medication instructions: -Hold:  Hydrochlorothiazide -AM of procedure -Other usual morning medications can be taken including aspirin  81 mg and Plavix  75 mg  Plan to go home the same day, you will only stay overnight if medically necessary.  You must have responsible adult to drive you home.  Someone must be with you the first 24 hours after you arrive home.  Reviewed procedure instructions with patient.

## 2024-01-03 ENCOUNTER — Other Ambulatory Visit: Payer: Self-pay

## 2024-01-03 ENCOUNTER — Ambulatory Visit (HOSPITAL_COMMUNITY)
Admission: RE | Admit: 2024-01-03 | Discharge: 2024-01-03 | Disposition: A | Discharge status: Home / Self Care | Attending: Cardiovascular Disease | Admitting: Cardiovascular Disease

## 2024-01-03 ENCOUNTER — Ambulatory Visit (HOSPITAL_COMMUNITY)
Admission: RE | Disposition: A | Discharge status: Home / Self Care | Payer: Self-pay | Source: Home / Self Care | Attending: Cardiovascular Disease

## 2024-01-03 DIAGNOSIS — I25118 Atherosclerotic heart disease of native coronary artery with other forms of angina pectoris: Secondary | ICD-10-CM | POA: Diagnosis not present

## 2024-01-03 DIAGNOSIS — Z7902 Long term (current) use of antithrombotics/antiplatelets: Secondary | ICD-10-CM | POA: Diagnosis not present

## 2024-01-03 DIAGNOSIS — Z955 Presence of coronary angioplasty implant and graft: Secondary | ICD-10-CM | POA: Diagnosis not present

## 2024-01-03 DIAGNOSIS — R9439 Abnormal result of other cardiovascular function study: Secondary | ICD-10-CM | POA: Diagnosis present

## 2024-01-03 DIAGNOSIS — F17290 Nicotine dependence, other tobacco product, uncomplicated: Secondary | ICD-10-CM | POA: Insufficient documentation

## 2024-01-03 DIAGNOSIS — I25119 Atherosclerotic heart disease of native coronary artery with unspecified angina pectoris: Secondary | ICD-10-CM

## 2024-01-03 DIAGNOSIS — I739 Peripheral vascular disease, unspecified: Secondary | ICD-10-CM | POA: Insufficient documentation

## 2024-01-03 DIAGNOSIS — I10 Essential (primary) hypertension: Secondary | ICD-10-CM | POA: Diagnosis not present

## 2024-01-03 DIAGNOSIS — Z79899 Other long term (current) drug therapy: Secondary | ICD-10-CM | POA: Diagnosis not present

## 2024-01-03 DIAGNOSIS — E785 Hyperlipidemia, unspecified: Secondary | ICD-10-CM | POA: Diagnosis not present

## 2024-01-03 DIAGNOSIS — I2584 Coronary atherosclerosis due to calcified coronary lesion: Secondary | ICD-10-CM | POA: Insufficient documentation

## 2024-01-03 HISTORY — PX: CORONARY PRESSURE/FFR WITH 3D MAPPING: CATH118309

## 2024-01-03 HISTORY — PX: CORONARY PRESSURE/FFR STUDY: CATH118243

## 2024-01-03 HISTORY — PX: LEFT HEART CATH AND CORONARY ANGIOGRAPHY: CATH118249

## 2024-01-03 LAB — POCT ACTIVATED CLOTTING TIME: Activated Clotting Time: 285 s

## 2024-01-03 SURGERY — LEFT HEART CATH AND CORONARY ANGIOGRAPHY
Anesthesia: LOCAL

## 2024-01-03 MED ORDER — SODIUM CHLORIDE 0.9% FLUSH: 3.0000 mL | Freq: Two times a day (BID) | INTRAVENOUS | Status: DC

## 2024-01-03 MED ORDER — LIDOCAINE HCL (PF) 1 % IJ SOLN
INTRAMUSCULAR | Status: DC | PRN
  Administered 2024-01-03: 5 mL via INTRADERMAL

## 2024-01-03 MED ORDER — LIDOCAINE HCL (PF) 1 % IJ SOLN
INTRAMUSCULAR | Status: AC
  Filled 2024-01-03: qty 30

## 2024-01-03 MED ORDER — FENTANYL CITRATE (PF) 100 MCG/2ML IJ SOLN
INTRAMUSCULAR | Status: DC | PRN
  Administered 2024-01-03 (×2): 25 ug via INTRAVENOUS

## 2024-01-03 MED ORDER — HEPARIN SODIUM (PORCINE) 1000 UNIT/ML IJ SOLN
INTRAMUSCULAR | Status: AC
  Filled 2024-01-03: qty 10

## 2024-01-03 MED ORDER — HEPARIN (PORCINE) IN NACL 1000-0.9 UT/500ML-% IV SOLN
INTRAVENOUS | Status: DC | PRN
Start: 2024-01-03 — End: 2024-01-03
  Administered 2024-01-03 (×2): 500 mL

## 2024-01-03 MED ORDER — SODIUM CHLORIDE 0.9 % IV SOLN: 250.0000 mL | INTRAVENOUS | Status: DC | PRN

## 2024-01-03 MED ORDER — IOHEXOL 350 MG/ML SOLN
INTRAVENOUS | Status: DC | PRN
  Administered 2024-01-03: 90 mL

## 2024-01-03 MED ORDER — ONDANSETRON HCL 4 MG/2ML IJ SOLN: 4.0000 mg | Freq: Four times a day (QID) | INTRAMUSCULAR | Status: DC | PRN

## 2024-01-03 MED ORDER — HEPARIN (PORCINE) IN NACL 2-0.9 UNITS/ML
INTRAMUSCULAR | Status: DC | PRN
  Administered 2024-01-03 (×2): 10 mL via INTRA_ARTERIAL

## 2024-01-03 MED ORDER — FENTANYL CITRATE (PF) 100 MCG/2ML IJ SOLN
INTRAMUSCULAR | Status: AC
  Filled 2024-01-03: qty 2

## 2024-01-03 MED ORDER — VERAPAMIL HCL 2.5 MG/ML IV SOLN
INTRAVENOUS | Status: AC
  Filled 2024-01-03: qty 2

## 2024-01-03 MED ORDER — SODIUM CHLORIDE 0.9% FLUSH: 3.0000 mL | INTRAVENOUS | Status: DC | PRN

## 2024-01-03 MED ORDER — MIDAZOLAM HCL 2 MG/2ML IJ SOLN
INTRAMUSCULAR | Status: AC
  Filled 2024-01-03: qty 2

## 2024-01-03 MED ORDER — ACETAMINOPHEN 325 MG PO TABS: 650.0000 mg | ORAL_TABLET | ORAL | Status: DC | PRN

## 2024-01-03 MED ORDER — NITROGLYCERIN 1 MG/10 ML FOR IR/CATH LAB
INTRA_ARTERIAL | Status: AC
  Filled 2024-01-03: qty 10

## 2024-01-03 MED ORDER — HEPARIN SODIUM (PORCINE) 1000 UNIT/ML IJ SOLN
INTRAMUSCULAR | Status: DC | PRN
  Administered 2024-01-03: 2500 [IU] via INTRAVENOUS
  Administered 2024-01-03: 3000 [IU] via INTRAVENOUS

## 2024-01-03 MED ORDER — NITROGLYCERIN 1 MG/10 ML FOR IR/CATH LAB
INTRA_ARTERIAL | Status: DC | PRN
  Administered 2024-01-03: 200 ug via INTRACORONARY

## 2024-01-03 MED ORDER — MIDAZOLAM HCL (PF) 2 MG/2ML IJ SOLN
INTRAMUSCULAR | Status: DC | PRN
Start: 2024-01-03 — End: 2024-01-03
  Administered 2024-01-03 (×2): 1 mg via INTRAVENOUS

## 2024-01-03 MED ORDER — FREE WATER: 500.0000 mL | Freq: Once | Status: DC

## 2024-01-03 SURGICAL SUPPLY — 15 items
CARD KEY FFR CATHWORX (MISCELLANEOUS) IMPLANT
CATH INFINITI 5 FR STR PIGTAIL (CATHETERS) IMPLANT
CATH INFINITI AMBI 5FR TG (CATHETERS) IMPLANT
CATH LAUNCHER 5F EBU3.0 (CATHETERS) IMPLANT
DEVICE RAD COMP TR BAND LRG (VASCULAR PRODUCTS) IMPLANT
DEVICE RAD TR BAND REGULAR (VASCULAR PRODUCTS) IMPLANT
GLIDESHEATH SLEND SS 6F .021 (SHEATH) IMPLANT
GUIDEWIRE INQWIRE 1.5J.035X260 (WIRE) IMPLANT
GUIDEWIRE PRESSURE X 175 (WIRE) IMPLANT
KIT ESSENTIALS PG (KITS) IMPLANT
KIT NAMIC PS PRESSURIZED FLUID (KITS) IMPLANT
KIT SYRINGE INJ CVI SPIKEX1 (MISCELLANEOUS) IMPLANT
PACK CARDIAC CATHETERIZATION (CUSTOM PROCEDURE TRAY) ×1 IMPLANT
SET ATX-X65L (MISCELLANEOUS) IMPLANT
TUBING CIL FLEX 10 FLL-RA (TUBING) IMPLANT

## 2024-01-03 NOTE — Interval H&P Note (Signed)
 History and Physical Interval Note:  01/03/2024 8:52 AM  [name]  has presented today for surgery, with the diagnosis of abnormal stress test.  The various methods of treatment have been discussed with the patient and family. After consideration of risks, benefits and other options for treatment, the patient has consented to  Procedure(s): LEFT HEART CATH AND CORONARY ANGIOGRAPHY (N/A) as a surgical intervention.  The patient's history has been reviewed, patient examined, no change in status, stable for surgery.  I have reviewed the patient's chart and labs.  Questions were answered to the patient's satisfaction.     [name]

## 2024-01-03 NOTE — Discharge Instructions (Signed)

## 2024-01-03 NOTE — Progress Notes (Signed)
 Pt and family member received discharge teaching, teachback performed. Iv removed, no complications. Pt escorted out via wheelchair to family member's vehicle.

## 2024-01-04 ENCOUNTER — Encounter (HOSPITAL_COMMUNITY): Payer: Self-pay | Admitting: Cardiovascular Disease

## 2024-01-04 ENCOUNTER — Telehealth: Payer: Self-pay

## 2024-01-04 DIAGNOSIS — E785 Hyperlipidemia, unspecified: Secondary | ICD-10-CM

## 2024-01-04 DIAGNOSIS — I10 Essential (primary) hypertension: Secondary | ICD-10-CM

## 2024-01-04 DIAGNOSIS — I25119 Atherosclerotic heart disease of native coronary artery with unspecified angina pectoris: Secondary | ICD-10-CM

## 2024-01-04 MED FILL — Heparin Sodium (Porcine) Inj 1000 Unit/ML: INTRAMUSCULAR | Qty: 10 | Status: AC

## 2024-01-04 NOTE — Telephone Encounter (Signed)
 Spoke with pt at length regarding her recent heart cath. Advised pt that she will need an echocardiogram prior to being seen by one of our Cardio-thoracic surgeon. (Spoke to scheduler via secure chat) Echo appointment offered to pt and she is able to take appointment for echocardiogram. Also, able to reach out to RN at [facility] and she is able to schedule pt at first available with Dr. Lucas for 11/26. Pt is also able to make this appointment. Pt expresses her concerns with having surgery at [facility], pt states she has lost family members at that hospital and that contributes to her anxieties. Pt states that she will keep current appointments made, however she would also like to research a few surgeons at [facility] and [facility] for a potential second opinion. Pt will let us  know if she chooses to pursue another surgeon. Pt also wanted to discuss blockages in her right leg. All questions answered to the best of my ability. Pt states she is satisfied with answers to her questions. Pt verbalizes understanding. Pt advised to reach out if additional questions arise.

## 2024-01-14 ENCOUNTER — Inpatient Hospital Stay

## 2024-01-14 ENCOUNTER — Inpatient Hospital Stay: Admitting: Hematology & Oncology

## 2024-01-15 ENCOUNTER — Ambulatory Visit (HOSPITAL_COMMUNITY)
Admission: RE | Admit: 2024-01-15 | Discharge: 2024-01-15 | Disposition: A | Discharge status: Home / Self Care | Source: Ambulatory Visit | Attending: Cardiovascular Disease | Admitting: Cardiovascular Disease

## 2024-01-15 DIAGNOSIS — I25119 Atherosclerotic heart disease of native coronary artery with unspecified angina pectoris: Secondary | ICD-10-CM | POA: Diagnosis not present

## 2024-01-15 DIAGNOSIS — E785 Hyperlipidemia, unspecified: Secondary | ICD-10-CM | POA: Insufficient documentation

## 2024-01-15 DIAGNOSIS — I10 Essential (primary) hypertension: Secondary | ICD-10-CM | POA: Insufficient documentation

## 2024-01-15 NOTE — Progress Notes (Signed)
  Echocardiogram 2D Echocardiogram has been performed.  Norleen ORN [facility] 01/15/2024, 2:49 PM

## 2024-01-16 ENCOUNTER — Ambulatory Visit: Payer: Self-pay | Admitting: Cardiovascular Disease

## 2024-01-16 ENCOUNTER — Encounter: Admitting: Surgery

## 2024-01-16 ENCOUNTER — Encounter: Payer: Self-pay | Admitting: *Deleted

## 2024-01-16 ENCOUNTER — Encounter: Payer: Self-pay | Admitting: Surgery

## 2024-01-16 ENCOUNTER — Ambulatory Visit: Attending: Surgery | Admitting: Surgery

## 2024-01-16 ENCOUNTER — Other Ambulatory Visit: Payer: Self-pay | Admitting: *Deleted

## 2024-01-16 VITALS — BP 165/91 | HR 59 | Resp 18 | Ht 61.0 in | Wt 115.0 lb

## 2024-01-16 DIAGNOSIS — I25119 Atherosclerotic heart disease of native coronary artery with unspecified angina pectoris: Secondary | ICD-10-CM | POA: Diagnosis not present

## 2024-01-16 LAB — ECHOCARDIOGRAM COMPLETE
Area-P 1/2: 3.24 cm2
Calc EF: 56.1 %
MV VTI: 1.64 cm2
S' Lateral: 2.6 cm
Single Plane A2C EF: 60.1 %
Single Plane A4C EF: 53.5 %

## 2024-01-18 NOTE — Progress Notes (Signed)
 [address] [name] [address]             [phone]     Cardiothoracic Surgery Consultation  PCP is Amber Other, MD Referring Provider is Amber Dorn PARAS, MD  Chief Complaint  Patient presents with   Coronary Artery Disease    HPI:  The patient is a 73 year old woman with a history of hypertension, hyperlipidemia, COPD and active smoking, carotid artery disease status post bilateral carotid endarterectomy, polycythemia vera treated by [name], peripheral vascular disease with recent vascular ultrasound showing greater than 70% stenosis at the origin of the right CIA with right buttock and thigh claudication, coronary artery disease status post left circumflex stenting in 2010, possible recent TIA with numbness on the left side of the body, and multivessel coronary disease.  She presents with episodes of substernal chest discomfort requiring sublingual nitroglycerin .  She had a Myoview  stress test on 12/13/2023 that was intermediate risk consistent with ischemia in the left circumflex marginal or posterolateral branch.  Cardiac catheterization on 01/03/2024 showed severe three-vessel coronary disease with 99% mid to distal left circumflex stenosis within a previously placed stent.  There is about 40% stenosis at the ostium of the LAD and 60% proximal, 70% mid vessel stenosis in the LAD.  The right coronary artery has a 90% mid to distal stenosis.  Left ventricular ejection fraction on echocardiogram was 55 to 60% with trivial mitral regurgitation.   Past Medical History:  Diagnosis Date   Anxiety    Arthritis    CAD (coronary artery disease)    a. s/p LCX stent 2010 with residual disease.   Carotid artery disease    Cataract    CHF (congestive heart failure) (HCC)    COPD (chronic obstructive pulmonary disease) (HCC)    Depression    GERD (gastroesophageal reflux disease)    Hepatitis C    History of carotid endarterectomy    Hyperlipidemia     borderline, I take a pill every [name] day   Hypertension    Noncompliance with medication regimen    Osteopenia    Polycythemia    Polycythemia vera (HCC)    PVD (peripheral vascular disease)    Seizures (HCC) 1970   x1- pt. reports that there was never any causative agent     TIA (transient ischemic attack)    UC (ulcerative colitis) (HCC)     Past Surgical History:  Procedure Laterality Date   2 stints  10/15/2008   Dr. Nanda   ABDOMINAL SURGERY  [phone]   for excessive bleeding from loss of pregnancy, required blood transfusion post procedure    CAROTID ENDARTERECTOMY Right 2007   cartoid endartherectomy  2007   CORONARY ANGIOPLASTY WITH STENT PLACEMENT  2010   CORONARY ANGIOPLASTY WITH STENT PLACEMENT  10/09/2008   CORONARY PRESSURE/FFR STUDY N/A 01/03/2024   Procedure: CORONARY PRESSURE/FFR STUDY;  Surgeon: Amber Deatrice LABOR, MD;  Location: [facility];  Service: Cardiovascular;  Laterality: N/A;   CORONARY PRESSURE/FFR WITH 3D MAPPING N/A 01/03/2024   Procedure: Coronary Pressure/FFR w/3D Mapping;  Surgeon: Amber Deatrice LABOR, MD;  Location: [facility];  Service: Cardiovascular;  Laterality: N/A;   ENDARTERECTOMY Left 07/24/2016   Procedure: ENDARTERECTOMY CAROTID;  Surgeon: Amber Krystal FALCON, MD;  Location: [facility] OR;  Service: Vascular;  Laterality: Left;   FLEXIBLE SIGMOIDOSCOPY N/A 03/22/2017   Procedure: FLEXIBLE SIGMOIDOSCOPY;  Surgeon: Amber Dover, MD;  Location: [facility] ENDOSCOPY;  Service:  Endoscopy;  Laterality: N/A;   LEFT HEART CATH AND CORONARY ANGIOGRAPHY N/A 01/03/2024   Procedure: LEFT HEART CATH AND CORONARY ANGIOGRAPHY;  Surgeon: Amber Deatrice LABOR, MD;  Location: [facility];  Service: Cardiovascular;  Laterality: N/A;   LYMPH NODE DISSECTION Left 1990's   benign    ORIF ELBOW FRACTURE Right 04/26/2018   Procedure: OPEN REDUCTION INTERNAL FIXATION (ORIF) ELBOW/OLECRANON FRACTURE;  Surgeon: Amber Franky SQUIBB, MD;  Location: [facility] OR;  Service:  Orthopedics;  Laterality: Right;   TUBAL LIGATION      Family History  Problem Relation Age of Onset   Heart disease Mother    Non-Hodgkin's lymphoma Father        and lung cancer   Heart disease Father    Liver disease Neg Hx    Esophageal cancer Neg Hx    Colon cancer Neg Hx    Rectal cancer Neg Hx    Stomach cancer Neg Hx     Social History Social History   Tobacco Use   Smoking status: Some Days    Current packs/day: 0.50    Average packs/day: 1.5 packs/day for 57.9 years (86.0 ttl pk-yrs)    Types: Cigarettes, E-cigarettes    Start date: 1968    Last attempt to quit: 2025    Passive exposure: Never   Smokeless tobacco: Current   Tobacco comments:    Vape and occasional smoking   Vaping Use   Vaping status: Some Days   Start date: 08/07/2020   Substances: Nicotine   Substance Use Topics   Alcohol use: Not Currently    Comment: socially occasionally   Drug use: Not Currently    Comment: has smoked cigarettes off and on    Current Outpatient Medications  Medication Sig Dispense Refill   acetaminophen  (TYLENOL ) 325 MG tablet Take 325-650 mg by mouth every 6 (six) hours as needed for mild pain or headache.     albuterol  (VENTOLIN  HFA) 108 (90 Base) MCG/ACT inhaler Inhale 1-2 puffs into the lungs every 4 (four) hours as needed for wheezing or shortness of breath.     ALPRAZolam  (XANAX ) 0.5 MG tablet Take 0.5 mg by mouth at bedtime.      Ascorbic Acid (VITAMIN C PO) Take 2 tablets by mouth daily.     clopidogrel  (PLAVIX ) 75 MG tablet Take 1 tablet by mouth once daily 90 tablet 3   clotrimazole -betamethasone  (LOTRISONE ) cream Apply 1 application  topically 2 (two) times daily as needed (to affected area).     escitalopram  (LEXAPRO ) 10 MG tablet Take 10 mg by mouth daily.     hydrochlorothiazide  (HYDRODIURIL ) 25 MG tablet Take 12.5 mg by mouth daily.     lansoprazole  (PREVACID ) 30 MG capsule Take 30 mg by mouth daily as needed (for ulcer/acid reflex). Ulcer/ acid reflux      metoprolol  succinate (TOPROL -XL) 100 MG 24 hr tablet TAKE 1 & 1/2 (ONE & ONE-HALF) TABLETS BY MOUTH ONCE DAILY . Please make yearly appt with Dr. Claudene for May for future refills. 1st attempt (Patient taking differently: Take 150 mg by mouth See admin instructions. 100mg  at 2pm and 50mg  at bedtime (splits one of the 100mg  tablets)) 135 tablet 1   nitroGLYCERIN  (NITROSTAT ) 0.4 MG SL tablet Place 0.4 mg under the tongue every 5 (five) minutes as needed for chest pain.     rosuvastatin  (CRESTOR ) 10 MG tablet Take 1 tablet (10 mg total) by mouth daily. (Patient taking differently: Take 10 mg by mouth every [name] day.)  90 tablet 3   telmisartan  (MICARDIS ) 80 MG tablet Take 1 tablet (80 mg total) by mouth daily. 90 tablet 3   Tiotropium Bromide-Olodaterol (STIOLTO RESPIMAT ) 2.5-2.5 MCG/ACT AERS Inhale 2 puffs into the lungs daily. 1 each 5   Trolamine Salicylate (BLUE-EMU HEMP EX) Apply 1 Application topically daily as needed (joint pain).     aspirin  EC 81 MG tablet Take 81 mg by mouth once. Swallow whole.  For heart cath (Patient not taking: Reported on 01/16/2024)     No current facility-administered medications for this visit.    Allergies  Allergen Reactions   Diphenhydramine Hives    Reaction was many years ago, I have taken one since for a bee sting and had no issues.   Sulfa  Antibiotics Nausea And Vomiting   Sulfur Nausea Only   Amlodipine  Besylate [name] (See Comments)    headaches   Nsaids [name] (See Comments)    She doesn't take Nsaids because it runs up her BP.   Sulfamethoxazole -Trimethoprim  Nausea And Vomiting    Review of Systems  Constitutional:  Positive for activity change and fatigue.  HENT: Negative.    Eyes: Negative.   Respiratory:  Positive for shortness of breath.   Cardiovascular:  Positive for chest pain. Negative for leg swelling.       Claudication in the right buttock and thigh.  Gastrointestinal: Negative.   Endocrine: Negative.   Genitourinary: Negative.    Musculoskeletal: Negative.   Skin: Negative.   Allergic/Immunologic: Negative.   Neurological:  Positive for numbness. Negative for dizziness and syncope.  Hematological: Negative.   Psychiatric/Behavioral:  The patient is nervous/anxious.        Depression    BP (!) 165/91 (BP Location: Left Arm)   Pulse (!) 59   Resp 18   Ht 5' 1 (1.549 m)   Wt 115 lb (52.2 kg)   SpO2 95%   BMI 21.73 kg/m  Physical Exam Constitutional:      Appearance: Normal appearance. She is normal weight.  HENT:     Head: Normocephalic and atraumatic.  Eyes:     Extraocular Movements: Extraocular movements intact.     Conjunctiva/sclera: Conjunctivae normal.     Pupils: Pupils are equal, round, and reactive to light.  Cardiovascular:     Rate and Rhythm: Normal rate and regular rhythm.     Heart sounds: Normal heart sounds. No murmur heard.    Comments: Right pedal pulses not palpable.  Left pedal pulses palpable Pulmonary:     Effort: Pulmonary effort is normal.     Breath sounds: Normal breath sounds.  Abdominal:     General: There is no distension.     Tenderness: There is no abdominal tenderness.  Musculoskeletal:        General: No swelling.  Skin:    General: Skin is warm and dry.  Neurological:     General: No focal deficit present.     Mental Status: She is alert and oriented to person, place, and time.  Psychiatric:        Mood and Affect: Mood normal.        Behavior: Behavior normal.      Diagnostic Tests:  ECHOCARDIOGRAM REPORT       Patient Name:   [name] Date of Exam: 01/15/2024  Medical Rec #:  [id number]        Height:       61.0 in  Accession #:    [id number]  Weight:       116.0 lb  Date of Birth:  [birth year]         BSA:          1.498 m  Patient Age:    73 years         BP:           118/73 mmHg  Patient Gender: F                HR:           63 bpm.  Exam Location:  Inpatient   Procedure: 2D Echo (Both Spectral and Color Flow Doppler were  utilized  during            procedure).   Indications:    CAD    History:        Patient has prior history of Echocardiogram examinations.  CAD.    Sonographer:   Norleen Amour  Referring Phys: [id number] JONATHAN J BERRY   IMPRESSIONS     1. Left ventricular ejection fraction, by estimation, is 55 to 60%. Left  ventricular ejection fraction by 2D MOD biplane is 56.1 %. The left  ventricle has normal function. The left ventricle has no regional wall  motion abnormalities. Left ventricular  diastolic parameters were normal.   2. Right ventricular systolic function is normal. The right ventricular  size is normal. Tricuspid regurgitation signal is inadequate for assessing  PA pressure.   3. The mitral valve is normal in structure. Trivial mitral valve  regurgitation. No evidence of mitral stenosis.   4. The aortic valve is tricuspid. Aortic valve regurgitation is not  visualized. No aortic stenosis is present.   5. The inferior vena cava is normal in size with greater than 50%  respiratory variability, suggesting right atrial pressure of 3 mmHg.   FINDINGS   Left Ventricle: Left ventricular ejection fraction, by estimation, is 55  to 60%. Left ventricular ejection fraction by 2D MOD biplane is 56.1 %.  The left ventricle has normal function. The left ventricle has no regional  wall motion abnormalities. The  left ventricular internal cavity size was normal in size. There is no left  ventricular hypertrophy. Left ventricular diastolic parameters were  normal.   Right Ventricle: The right ventricular size is normal. No increase in  right ventricular wall thickness. Right ventricular systolic function is  normal. Tricuspid regurgitation signal is inadequate for assessing PA  pressure.   Left Atrium: Left atrial size was normal in size.   Right Atrium: Right atrial size was normal in size.   Pericardium: There is no evidence of pericardial effusion.   Mitral Valve: The mitral  valve is normal in structure. Trivial mitral  valve regurgitation. No evidence of mitral valve stenosis. MV peak  gradient, 2.1 mmHg. The mean mitral valve gradient is 1.0 mmHg.   Tricuspid Valve: The tricuspid valve is normal in structure. Tricuspid  valve regurgitation is trivial. No evidence of tricuspid stenosis.   Aortic Valve: The aortic valve is tricuspid. Aortic valve regurgitation is  not visualized. No aortic stenosis is present.   Pulmonic Valve: The pulmonic valve was normal in structure. Pulmonic valve  regurgitation is trivial. No evidence of pulmonic stenosis.   Aorta: The aortic root is normal in size and structure.   Venous: The inferior vena cava is normal in size with greater than 50%  respiratory variability, suggesting right atrial pressure of 3 mmHg.   IAS/Shunts: No atrial level shunt  detected by color flow Doppler.     LEFT VENTRICLE  PLAX 2D                        Biplane EF (MOD)  LVIDd:         4.10 cm         LV Biplane EF:   Left  LVIDs:         2.60 cm                          ventricular  LV PW:         1.00 cm                          ejection  LV IVS:        0.80 cm                          fraction by  LVOT diam:     1.65 cm                          2D MOD  LV SV:         39                               biplane is  LV SV Index:   26                               56.1 %.  LVOT Area:     2.14 cm                                 Diastology                                 LV e' medial:    7.51 cm/s  LV Volumes (MOD)               LV E/e' medial:  10.1  LV vol d, MOD    54.1 ml       LV e' lateral:   7.94 cm/s  A2C:                           LV E/e' lateral: 9.6  LV vol d, MOD    48.4 ml  A4C:  LV vol s, MOD    21.6 ml  A2C:  LV vol s, MOD    22.5 ml  A4C:  LV SV MOD A2C:   32.5 ml  LV SV MOD A4C:   48.4 ml  LV SV MOD BP:    29.6 ml   RIGHT VENTRICLE            IVC  RV Basal diam:  2.52 cm    IVC diam: 1.43 cm  RV S prime:     9.36 cm/s   TAPSE (M-mode): 1.8 cm     PULMONARY VEINS  Diastolic Velocity: 43.10 cm/s                             S/D Velocity:       1.20                             Systolic Velocity:  53.80 cm/s   LEFT ATRIUM             Index        RIGHT ATRIUM          Index  LA diam:        2.86 cm 1.91 cm/m   RA Area:     6.66 cm  LA Vol (A2C):   42.7 ml 28.50 ml/m  RA Volume:   10.80 ml 7.21 ml/m  LA Vol (A4C):   33.3 ml 22.22 ml/m  LA Biplane Vol: 40.0 ml 26.69 ml/m   AORTIC VALVE             PULMONIC VALVE  LVOT Vmax:   71.30 cm/s  PV Vmax:       0.79 m/s  LVOT Vmean:  52.900 cm/s PV Peak grad:  2.5 mmHg  LVOT VTI:    0.182 m    AORTA  Ao Root diam: 2.20 cm  Ao Asc diam:  2.44 cm   MITRAL VALVE  MV Area (PHT): 3.24 cm    SHUNTS  MV Area VTI:   1.64 cm    Systemic VTI:  0.18 m  MV Peak grad:  2.1 mmHg    Systemic Diam: 1.65 cm  MV Mean grad:  1.0 mmHg  MV Vmax:       0.72 m/s  MV Vmean:      45.8 cm/s  MV Decel Time: 234 msec  MV E velocity: 75.90 cm/s  MV A velocity: 80.50 cm/s  MV E/A ratio:  0.94   Soyla Merck MD  Electronically signed by Soyla Merck MD  Signature Date/Time: 01/16/2024/10:11:27 AM     Procedures  LEFT HEART CATH AND CORONARY ANGIOGRAPHY  Coronary Pressure/FFR w/3D Mapping  CORONARY PRESSURE/FFR STUDY   Conclusion      Prox Cx lesion is 10% stenosed.   Mid Cx to Dist Cx lesion is 99% stenosed.   Prox LAD to Mid LAD lesion is 60% stenosed.   2nd Diag lesion is 40% stenosed.   Dist LM to Ost LAD lesion is 40% stenosed.   Prox RCA to Mid RCA lesion is 30% stenosed.   Mid RCA to Dist RCA lesion is 90% stenosed.   Mid LAD lesion is 70% stenosed.   The left ventricular systolic function is normal.   LV end diastolic pressure is mildly elevated.   The left ventricular ejection fraction is 55-65% by visual estimate.   1.  Severe three-vessel coronary artery disease.  The LAD disease appeared borderline but was highly  significant with both virtual FFR and RFR.  RFR was 0.78.  There is subtotal occlusion of the mid to distal left circumflex stents and high-grade stenosis in the mid to distal right coronary artery.  The coronary arteries are moderately to severely calcified. 2.  Normal LV systolic function mildly elevated left ventricular end-diastolic pressure. 3.  Not able to get into the descending aorta to image the right iliac stenosis due to tortuosity and significant radial artery spasm.   Recommendations: Recommend evaluation for CABG.  Clopidogrel  can be discontinued once surgery is scheduled.   Procedural Details  Technical Details Procedural Details: The right wrist was prepped, draped, and anesthetized with 1% lidocaine . Using the modified Seldinger technique, a 5 French Slender sheath was introduced into the right radial artery. 3 mg of verapamil  was administered through the sheath, weight-based unfractionated heparin  was administered intravenously. A TIG4 catheter was used for selective coronary angiography and left ventriculography.  The patient had significant right radial artery spasm.  I given additional dose of 3 mg verapamil  and 200 mcg of nitroglycerin .  The arterial sheath.  Catheter exchanges were performed over an exchange length guidewire. There were no immediate procedural complications.  I attempted to get into the descending aorta to image the right iliac stenosis but I could not get any catheter to go down the descending aorta given tortuosity and radial artery spasm.  PCI Note:  Following the diagnostic procedure, the decision was made to proceed with flow reserve evaluation of the LAD.  This was done with cath works and also with pressure wire based.  The pressure X wire was used.   The patient tolerated the procedure well. There were no immediate procedural complications. A TR band was used for radial hemostasis. The patient was transferred to the post catheterization recovery area for  further monitoring.   Estimated blood loss <50 mL.   During this procedure medications were administered to achieve and maintain moderate conscious sedation while the patient's heart rate, blood pressure, and oxygen saturation were continuously monitored and I was present face-to-face 100% of this time. From  8:53 AM to  9:47 AM, Juliene Hicks RN and Shasta Bevely Gosling Cardiovascular Specialist, who are independent, trained observers, assisted in the monitoring of the patient's level of consciousness.   Medications (Filter: Administrations occurring from [phone] to 1005 on 01/03/24) Heparin  (Porcine) in NaCl 1000-0.9 UT/500ML-% SOLN (mL)  Total volume: 1,000 mL Date/Time Rate/Dose/Volume Action   01/03/24 0836 500 mL Given   0836 500 mL Given   midazolam  PF (VERSED ) injection (mg)  Total dose: 2 mg Date/Time Rate/Dose/Volume Action   01/03/24 0853 1 mg Given   0920 1 mg Given   fentaNYL  (SUBLIMAZE ) injection (mcg)  Total dose: 50 mcg Date/Time Rate/Dose/Volume Action   01/03/24 0853 25 mcg Given   0920 25 mcg Given   lidocaine  (PF) (XYLOCAINE ) 1 % injection (mL)  Total volume: 5 mL Date/Time Rate/Dose/Volume Action   01/03/24 0900 5 mL Given   Radial Cocktail/Verapamil  only (mL)  Total volume: 20 mL Date/Time Rate/Dose/Volume Action   01/03/24 0902 10 mL Given   0921 10 mL Given   heparin  sodium (porcine) injection (Units)  Total dose: 5,500 Units Date/Time Rate/Dose/Volume Action   01/03/24 0905 2,500 Units Given   0923 3,000 Units Given   nitroGLYCERIN  1 mg/10 mL (100 mcg/mL) - IR/CATH LAB (mcg)  Total dose: 200 mcg Date/Time Rate/Dose/Volume Action   01/03/24 0923 200 mcg Given   iohexol  (OMNIPAQUE ) 350 MG/ML injection (mL)  Total volume: 90 mL Date/Time Rate/Dose/Volume Action   01/03/24 0948 90 mL Given    Sedation Time  Sedation Time Physician-1: 54 minutes 4 seconds Contrast     Administrations occurring from 0835 to 1005 on 01/03/24:  Medication Name Total  Dose  iohexol  (OMNIPAQUE ) 350 MG/ML injection 90 mL   Radiation/Fluoro  Fluoro time: 14.8 (min) DAP: 17401 (mGycm2) Cumulative Air Kerma: 355 (mGy) Complications  Complications documented before study signed (01/03/2024 10:12 AM)   No complications were associated with  this study.  Documented by Bevely Gosling, Shasta Terry Saunas - 01/03/2024  9:49 AM     Coronary Findings  Diagnostic Dominance: Right Left Main  Dist LM to Ost LAD lesion is 40% stenosed.    Left Anterior Descending  Prox LAD to Mid LAD lesion is 60% stenosed.  Mid LAD lesion is 70% stenosed. Pressure gradient was performed on the lesion using a GUIDEWIRE PRESSURE X 175 and CATH LAUNCHER 77F EBU3.0. RFR: 0.79. Virtual FFR: 78.    Second Diagonal Branch  Vessel is large in size.  2nd Diag lesion is 40% stenosed.    Left Circumflex  Prox Cx lesion is 10% stenosed. The lesion was previously treated over 2 years ago.  Mid Cx to Dist Cx lesion is 99% stenosed. The lesion was previously treated over 2 years ago. Previously placed stent displays restenosis.    Third Obtuse Marginal Branch  Collaterals  3rd Mrg filled by collaterals from Lat 1st Mrg.      Right Coronary Artery  Prox RCA to Mid RCA lesion is 30% stenosed.  Mid RCA to Dist RCA lesion is 90% stenosed.    Intervention   No interventions have been documented.   Left Heart  Left Ventricle The left ventricular size is normal. The left ventricular systolic function is normal. LV end diastolic pressure is mildly elevated. The left ventricular ejection fraction is 55-65% by visual estimate. No regional wall motion abnormalities.   Coronary Diagrams  Diagnostic Dominance: Right  Intervention   Implants   No implant documentation for this case.   Syngo Images   Show images for CARDIAC CATHETERIZATION Images on Long Term Storage   Show images for Forever, Arechiga to Procedure Log  Procedure Log   Hemo Data  Flowsheet Row  Most Recent Value  Aortic Mean Gradient 0 mmHg  Aortic Peak Gradient 0 mmHg  AO Systolic Pressure 145 mmHg  AO Diastolic Pressure 67 mmHg  AO Mean 100 mmHg  LV Systolic Pressure 182 mmHg  LV Diastolic Pressure 1 mmHg  LV EDP 24 mmHg  Arterial Occlusion Pressure Extended Systolic Pressure 149 mmHg  Arterial Occlusion Pressure Extended Diastolic Pressure 70 mmHg  Arterial Occlusion Pressure Extended Mean Pressure 102 mmHg  Left Ventricular Apex Extended Systolic Pressure 145 mmHg  LVp Diastolic Pressure 25 mmHg  Left Ventricular Apex Extended EDP Pressure 27 mmHg    Impression:  This 73 year old woman has severe three-vessel coronary disease with progressive exertional anginal symptoms as well as significant claudication symptoms in the right buttock and thigh that are limiting her mobility and will likely require intervention.  I agree that coronary bypass graft surgery is the best treatment to prevent further ischemia and infarction and improve her quality of life. I discussed the operative procedure with the patient and family including alternatives, benefits and risks; including but not limited to bleeding, blood transfusion, infection, stroke, myocardial infarction, graft failure, heart block requiring a permanent pacemaker, organ dysfunction, and death.  [name] understands and agrees to proceed.     Plan:  She will be scheduled for coronary bypass graft surgery on Monday, 02/18/2024.  I spent 60 minutes performing this consultation and > 50% of this time was spent face to face counseling and coordinating the care of this patient's severe multivessel coronary disease.   Dorise MARLA Fellers, MD [facility] [phone]

## 2024-01-21 ENCOUNTER — Ambulatory Visit: Admitting: Cardiovascular Disease

## 2024-01-21 ENCOUNTER — Telehealth: Payer: Self-pay | Admitting: Cardiovascular Disease

## 2024-01-21 NOTE — Telephone Encounter (Signed)
 Patient's appt today was scheduled for cath follow-up. She states she has had an echo, seen Dr. Lucas and has surgery scheduled fro 02/18/24. She is asking if today's appt is necessary and requests a call from Kryestyn. Patient feels today's appt is not needed unless Dr. Court says otherwise.  Message sent to Dr. Court and his nurse.

## 2024-01-21 NOTE — Telephone Encounter (Signed)
 Response from Dr. Court: 31 mins JB Dorn JINNY Court, MD From my point of view, not necessary   Patient informed, appt for today cancelled. Patient expressed appreciation for follow-up.

## 2024-01-21 NOTE — Telephone Encounter (Signed)
 Pt wants to know how neccessary her appt. Is for 12/1 at 3. Please advise.

## 2024-02-11 ENCOUNTER — Encounter (HOSPITAL_COMMUNITY)

## 2024-02-11 NOTE — Pre-Procedure Instructions (Signed)
 "   [name]  02/11/2024       Surgical Instructions   Your procedure is scheduled on Mon., Dec. 29, 2025 from 07:30 A.M-12:52 P.M.  Report to [facility] Main Entrance A at 05:30 A.M., then check in with the Admitting office.  Call this number if you have problems the morning of surgery:  [phone]  If you experience any cold or flu symptoms such as cough, fever, chills, shortness of breath, etc. between now and your scheduled surgery, please notify us  at the above number.   Remember:  Do not eat or drink after midnight the night before your surgery   Take these medicines the morning of surgery with A SIP OF WATER :  Escitalopram  (LEXAPRO )  Metoprolol  succinate (TOPROL -XL)  Rosuvastatin  (CRESTOR )   As Needed: Acetaminophen  (TYLENOL )  Albuterol  (VENTOLIN  HFA)-Please bring day of surgery Lansoprazole (PREVACID)  NitroGLYCERIN  (NITROSTAT )   Follow your doctor's instruction regarding Clopidogrel  (PLAVIX ).   As of today, STOP taking any Aspirin  (unless otherwise instructed by your surgeon) Aleve, Naproxen, Ibuprofen, Motrin, Advil, Goody's, BC's, all herbal medications, fish oil, and all vitamins.             Day of Surgery: Do not wear jewelry or makeup. Do not wear lotions, powders, perfumes, or deodorant. Do not shave arms, armpits, groin, and legs 48 hours prior to surgery.   Do not bring valuables to the hospital. DO Not wear nail polish, gel polish, artificial nails, or any other type of covering on natural nails (fingers and toes) If you have artificial nails or gel coating that need to be removed by a nail salon, please have this removed prior to surgery. Artificial nails or gel coating may interfere with anesthesia's ability to adequately monitor your vital signs.             [facility] is not responsible for any belongings or valuables.  Do NOT Smoke (Tobacco/Vaping)  24 hours prior to your procedure  If you use a CPAP at night, you may bring your mask  and machine for your overnight stay.   Contacts, glasses, hearing aids, dentures or partials may not be worn into surgery, please bring cases for these belongings   For patients admitted to the hospital, discharge time will be determined by your treatment team.   Patients discharged the day of surgery will not be allowed to drive home, and someone needs to stay with them for 24 hours.  Special instructions:    Oral Hygiene is also important to reduce your risk of infection.  Remember - BRUSH YOUR TEETH THE MORNING OF SURGERY WITH YOUR REGULAR TOOTHPASTE   [place]- Preparing For Surgery  Before surgery, you can play an important role. Because skin is not sterile, your skin needs to be as free of germs as possible. You can reduce the number of germs on your skin by washing with CHG (chlorahexidine gluconate) Soap before surgery.  CHG is an antiseptic cleaner which kills germs and bonds with the skin to continue killing germs even after washing.  DO NOT DRINK!  Please do not use if you have an allergy to CHG or antibacterial soaps. If your skin becomes reddened/irritated stop using the CHG.  Do not shave (including legs and underarms) for at least 48 hours prior to first CHG shower. It is OK to shave your face.  Please follow these instructions carefully.    Shower the NIGHT BEFORE SURGERY and the MORNING OF SURGERY with CHG Soap.  If you chose to wash your hair, wash your hair first as usual with your normal shampoo. After you shampoo, rinse your hair and body thoroughly to remove the shampoo.  Then [employer] and genitals (private parts) with your normal soap and rinse thoroughly to remove soap.  After that Use CHG Soap as you would any other liquid soap. You can apply CHG directly to the skin and wash gently with a scrungie or a clean washcloth.   Apply the CHG Soap to your body ONLY FROM THE NECK DOWN.  Do not use on open wounds or open sores. Avoid contact with your eyes, ears, mouth  and genitals (private parts). Wash Face and genitals (private parts)  with your normal soap.   Wash thoroughly, paying special attention to the area where your surgery will be performed.  Thoroughly rinse your body with warm water  from the neck down.  DO NOT shower/wash with your normal soap after using and rinsing off the CHG Soap.  Pat yourself dry with a CLEAN TOWEL.  Wear CLEAN PAJAMAS to bed the night before surgery  Place CLEAN SHEETS on your bed the night before your surgery  DO NOT SLEEP WITH PETS.  Reminder: Take a shower with CHG soap. Wear Clean/Comfortable clothing the morning of surgery Do not apply any deodorants/lotions.   Remember to brush your teeth WITH YOUR REGULAR TOOTHPASTE.  Please read over the following fact sheets that you were given.         "

## 2024-02-12 ENCOUNTER — Ambulatory Visit (HOSPITAL_BASED_OUTPATIENT_CLINIC_OR_DEPARTMENT_OTHER)
Admission: RE | Admit: 2024-02-12 | Discharge: 2024-02-12 | Disposition: A | Discharge status: Home / Self Care | Source: Ambulatory Visit | Attending: Surgery | Admitting: Surgery

## 2024-02-12 ENCOUNTER — Ambulatory Visit (HOSPITAL_COMMUNITY)
Admission: RE | Admit: 2024-02-12 | Discharge: 2024-02-12 | Disposition: A | Discharge status: Home / Self Care | Source: Ambulatory Visit | Attending: Surgery | Admitting: Surgery

## 2024-02-12 ENCOUNTER — Other Ambulatory Visit: Payer: Self-pay

## 2024-02-12 ENCOUNTER — Encounter (HOSPITAL_COMMUNITY): Payer: Self-pay

## 2024-02-12 VITALS — BP 192/82 | HR 54 | Temp 98.0°F | Resp 16 | Ht 61.0 in | Wt 117.0 lb

## 2024-02-12 DIAGNOSIS — Z9889 Other specified postprocedural states: Secondary | ICD-10-CM | POA: Insufficient documentation

## 2024-02-12 DIAGNOSIS — K5190 Ulcerative colitis, unspecified, without complications: Secondary | ICD-10-CM | POA: Insufficient documentation

## 2024-02-12 DIAGNOSIS — Z79899 Other long term (current) drug therapy: Secondary | ICD-10-CM | POA: Insufficient documentation

## 2024-02-12 DIAGNOSIS — Z01818 Encounter for other preprocedural examination: Secondary | ICD-10-CM | POA: Insufficient documentation

## 2024-02-12 DIAGNOSIS — Z8673 Personal history of transient ischemic attack (TIA), and cerebral infarction without residual deficits: Secondary | ICD-10-CM | POA: Diagnosis not present

## 2024-02-12 DIAGNOSIS — K219 Gastro-esophageal reflux disease without esophagitis: Secondary | ICD-10-CM | POA: Insufficient documentation

## 2024-02-12 DIAGNOSIS — J449 Chronic obstructive pulmonary disease, unspecified: Secondary | ICD-10-CM | POA: Insufficient documentation

## 2024-02-12 DIAGNOSIS — Z955 Presence of coronary angioplasty implant and graft: Secondary | ICD-10-CM | POA: Insufficient documentation

## 2024-02-12 DIAGNOSIS — Z01810 Encounter for preprocedural cardiovascular examination: Secondary | ICD-10-CM | POA: Diagnosis present

## 2024-02-12 DIAGNOSIS — I440 Atrioventricular block, first degree: Secondary | ICD-10-CM | POA: Diagnosis not present

## 2024-02-12 DIAGNOSIS — I25119 Atherosclerotic heart disease of native coronary artery with unspecified angina pectoris: Secondary | ICD-10-CM | POA: Insufficient documentation

## 2024-02-12 DIAGNOSIS — D45 Polycythemia vera: Secondary | ICD-10-CM | POA: Diagnosis not present

## 2024-02-12 DIAGNOSIS — I110 Hypertensive heart disease with heart failure: Secondary | ICD-10-CM | POA: Diagnosis not present

## 2024-02-12 DIAGNOSIS — Z7902 Long term (current) use of antithrombotics/antiplatelets: Secondary | ICD-10-CM | POA: Diagnosis not present

## 2024-02-12 DIAGNOSIS — I2510 Atherosclerotic heart disease of native coronary artery without angina pectoris: Secondary | ICD-10-CM | POA: Diagnosis present

## 2024-02-12 DIAGNOSIS — Z8619 Personal history of other infectious and parasitic diseases: Secondary | ICD-10-CM | POA: Diagnosis not present

## 2024-02-12 DIAGNOSIS — I7789 Other specified disorders of arteries and arterioles: Secondary | ICD-10-CM | POA: Insufficient documentation

## 2024-02-12 DIAGNOSIS — R7303 Prediabetes: Secondary | ICD-10-CM | POA: Insufficient documentation

## 2024-02-12 DIAGNOSIS — F419 Anxiety disorder, unspecified: Secondary | ICD-10-CM | POA: Insufficient documentation

## 2024-02-12 DIAGNOSIS — Z01812 Encounter for preprocedural laboratory examination: Secondary | ICD-10-CM | POA: Diagnosis present

## 2024-02-12 DIAGNOSIS — I509 Heart failure, unspecified: Secondary | ICD-10-CM | POA: Diagnosis not present

## 2024-02-12 DIAGNOSIS — F17210 Nicotine dependence, cigarettes, uncomplicated: Secondary | ICD-10-CM | POA: Insufficient documentation

## 2024-02-12 DIAGNOSIS — I739 Peripheral vascular disease, unspecified: Secondary | ICD-10-CM | POA: Diagnosis not present

## 2024-02-12 HISTORY — DX: Prediabetes: R73.03

## 2024-02-12 LAB — SURGICAL PCR SCREEN
MRSA, PCR: NEGATIVE
Staphylococcus aureus: NEGATIVE

## 2024-02-12 LAB — COMPREHENSIVE METABOLIC PANEL WITH GFR
ALT: 18 U/L (ref 0–44)
AST: 27 U/L (ref 15–41)
Albumin: 4.7 g/dL (ref 3.5–5.0)
Alkaline Phosphatase: 84 U/L (ref 38–126)
Anion gap: 9 (ref 5–15)
BUN: 13 mg/dL (ref 8–23)
CO2: 32 mmol/L (ref 22–32)
Calcium: 10 mg/dL (ref 8.9–10.3)
Chloride: 92 mmol/L — ABNORMAL LOW (ref 98–111)
Creatinine, Ser: 0.83 mg/dL (ref 0.44–1.00)
GFR, Estimated: >60 mL/min
Glucose, Bld: 103 mg/dL — ABNORMAL HIGH (ref 70–99)
Potassium: 4.7 mmol/L (ref 3.5–5.1)
Sodium: 133 mmol/L — ABNORMAL LOW (ref 135–145)
Total Bilirubin: 0.6 mg/dL (ref 0.0–1.2)
Total Protein: 7.8 g/dL (ref 6.5–8.1)

## 2024-02-12 LAB — URINALYSIS, ROUTINE W REFLEX MICROSCOPIC
Bilirubin Urine: NEGATIVE
Glucose, UA: NEGATIVE mg/dL
Hgb urine dipstick: NEGATIVE
Ketones, ur: NEGATIVE mg/dL
Leukocytes,Ua: NEGATIVE
Nitrite: NEGATIVE
Protein, ur: NEGATIVE mg/dL
Specific Gravity, Urine: 1.008 (ref 1.005–1.030)
pH: 5 (ref 5.0–8.0)

## 2024-02-12 LAB — CBC
HCT: 42.7 % (ref 36.0–46.0)
Hemoglobin: 14.3 g/dL (ref 12.0–15.0)
MCH: 31.6 pg (ref 26.0–34.0)
MCHC: 33.5 g/dL (ref 30.0–36.0)
MCV: 94.3 fL (ref 80.0–100.0)
Platelets: 276 K/uL (ref 150–400)
RBC: 4.53 MIL/uL (ref 3.87–5.11)
RDW: 11.8 % (ref 11.5–15.5)
WBC: 9.9 K/uL (ref 4.0–10.5)
nRBC: 0 % (ref 0.0–0.2)

## 2024-02-12 LAB — APTT: aPTT: 40 s — ABNORMAL HIGH (ref 24–36)

## 2024-02-12 LAB — PROTIME-INR
INR: 0.9 (ref 0.8–1.2)
Prothrombin Time: 12.9 s (ref 11.4–15.2)

## 2024-02-12 LAB — VAS US DOPPLER PRE CABG

## 2024-02-12 LAB — HEMOGLOBIN A1C
Hgb A1c MFr Bld: 5.6 % (ref 4.8–5.6)
Mean Plasma Glucose: 114.02 mg/dL

## 2024-02-12 NOTE — Progress Notes (Addendum)
 PCP - Ransom Other, MD  Cardiologist - Court Dorn PARAS, MD   PPM/ICD - denies   Chest x-ray - 02/12/2024  EKG - 02/12/2024  Stress Test - 12/13/19 ECHO - 01/15/24 Cardiac Cath - 01/03/24  Sleep Study - denies   Fasting Blood Sugar - N/A  Last dose of GLP1 agonist-  N/A   Blood Thinner Instructions: Hold Plavix  5 days- last dose 02/12/24 Aspirin  Instructions: N/A  ERAS Protcol - NPO   COVID TEST-  N/A   Anesthesia review: yes- cardiac history- BP elevated at PAT 192/78, 192/82 - Anesthesia APP notified. Pt did not take her BP medication today. Pt to go home and take her BP medications. Pt is asymptomatic, but is aware to seek medical attention/go to ED if she develops symptoms such as chest pain, shortness of breath, dizziness, throbbing headache, or other concerning symptoms.   Patient denies shortness of breath, fever, cough and chest pain at PAT appointment   All instructions explained to the patient, with a verbal understanding of the material. Patient agrees to go over the instructions while at home for a better understanding. The opportunity to ask questions was provided.

## 2024-02-12 NOTE — Pre-Procedure Instructions (Signed)
 Surgical Instructions   Your procedure is scheduled on February 18, 2024. Report to [facility] Main Entrance A at 5:30 A.M., then check in with the Admitting office. Any questions or running late day of surgery: call [phone]  Questions prior to your surgery date: call [phone], Monday-Friday, 8am-4pm. If you experience any cold or flu symptoms such as cough, fever, chills, shortness of breath, etc. between now and your scheduled surgery, please notify us  at the above number.     Remember:  Do not eat after midnight the night before your surgery. No gum, mints or hard candy.     Take these medicines the morning of surgery with A SIP OF WATER : Escitalopram  (LEXAPRO )  Metoprolol  succinate (TOPROL -XL)  Rosuvastatin  (CRESTOR )   May take these medicines IF NEEDED: Acetaminophen  (TYLENOL )  Albuterol  (VENTOLIN  HFA)-Please bring day of surgery Lansoprazole (PREVACID)  NitroGLYCERIN  (NITROSTAT )  Tiotropium Bromide-Olodaterol (STIOLTO RESPIMAT ) 2.5-2.5 MCG/ACT AERS   STOP PLAVIX  5 DAYS PRIOR TO SURGERY per your surgeon's instructions. Your last dose should be 02/12/24.  You may continue taking ASPIRIN  until the day before surgery.   One week prior to surgery, STOP taking any  Aleve, Naproxen, Ibuprofen, Motrin, Advil, Goody's, BC's, all herbal medications, fish oil, and non-prescription vitamins.                     Do NOT Smoke (Tobacco/Vaping) for 24 hours prior to your procedure.  If you use a CPAP at night, you may bring your mask/headgear for your overnight stay.   You will be asked to remove any contacts, glasses, piercing's, hearing aid's, dentures/partials prior to surgery. Please bring cases for these items if needed.    Patients discharged the day of surgery will not be allowed to drive home, and someone needs to stay with them for 24 hours.  SURGICAL WAITING ROOM VISITATION Patients may have no more than 2 support people in the waiting area - these visitors may  rotate.   Pre-op nurse will coordinate an appropriate time for 1 ADULT support person, who may not rotate, to accompany patient in pre-op.  Children under the age of 25 must have an adult with them who is not the patient and must remain in the main waiting area with an adult.  If the patient needs to stay at the hospital during part of their recovery, the visitor guidelines for inpatient rooms apply.  Please refer to the [facility] website for the visitor guidelines for any additional information.   If you received a COVID test during your pre-op visit  it is requested that you wear a mask when out in public, stay away from anyone that may not be feeling well and notify your surgeon if you develop symptoms. If you have been in contact with anyone that has tested positive in the last 10 days please notify you surgeon.      Pre-operative CHG Bathing Instructions   You can play a key role in reducing the risk of infection after surgery. Your skin needs to be as free of germs as possible. You can reduce the number of germs on your skin by washing with CHG (chlorhexidine  gluconate) soap before surgery. CHG is an antiseptic soap that kills germs and continues to kill germs even after washing.   DO NOT use if you have an allergy to chlorhexidine /CHG or antibacterial soaps. If your skin becomes reddened or irritated, stop using the CHG and notify one of our RNs at [phone].  TAKE A SHOWER THE NIGHT BEFORE SURGERY   Please keep in mind the following:  DO NOT shave, including legs and underarms, 48 hours prior to surgery.   You may shave your face before/day of surgery.  Place clean sheets on your bed the night before surgery Use a clean washcloth (not used since being washed) for shower. DO NOT sleep with pet's night before surgery.  CHG Shower Instructions:  Wash your face and private area with normal soap. If you choose to wash your hair, wash first with your normal shampoo.   After you use shampoo/soap, rinse your hair and body thoroughly to remove shampoo/soap residue.  Turn the water  OFF and apply half the bottle of CHG soap to a CLEAN washcloth.  Apply CHG soap ONLY FROM YOUR NECK DOWN TO YOUR TOES (washing for 3-5 minutes)  DO NOT use CHG soap on face, private areas, open wounds, or sores.  Pay special attention to the area where your surgery is being performed.  If you are having back surgery, having someone wash your back for you may be helpful. Wait 2 minutes after CHG soap is applied, then you may rinse off the CHG soap.  Pat dry with a clean towel  Put on clean pajamas    Additional instructions for the day of surgery: If you choose, you may shower the morning of surgery with an antibacterial soap.  DO NOT APPLY any lotions, deodorants, cologne, or perfumes.   Do not wear jewelry or makeup Do not wear nail polish, gel polish, artificial nails, or any other type of covering on natural nails (fingers and toes) Do not bring valuables to the hospital. [facility] is not responsible for valuables/personal belongings. Put on clean/comfortable clothes.  Please brush your teeth.  Ask your nurse before applying any prescription medications to the skin.

## 2024-02-13 NOTE — Hospital Course (Addendum)
 PCP is Ransom Other, MD Referring Provider is Court Dorn PARAS, MD  HPI: The patient is a 73 year old woman with a history of hypertension, hyperlipidemia, COPD and active smoking, carotid artery disease status post bilateral carotid endarterectomy, polycythemia vera treated by Dr. Timmy, peripheral vascular disease with recent vascular ultrasound showing greater than 70% stenosis at the origin of the right CIA with right buttock and thigh claudication, coronary artery disease status post left circumflex stenting in 2010, possible recent TIA with numbness on the left side of the body, and multivessel coronary disease.  She presents with episodes of substernal chest discomfort requiring sublingual nitroglycerin .  She had a Myoview  stress test on 12/13/2023 that was intermediate risk consistent with ischemia in the left circumflex marginal or posterolateral branch.  Cardiac catheterization on 01/03/2024 showed severe three-vessel coronary disease with 99% mid to distal left circumflex stenosis within a previously placed stent.  There is about 40% stenosis at the ostium of the LAD and 60% proximal, 70% mid vessel stenosis in the LAD.  The right coronary artery has a 90% mid to distal stenosis.  Left ventricular ejection fraction on echocardiogram was 55 to 60% with trivial mitral regurgitation.  Dr. Lucas discussed the need for coronary artery bypass grafting surgery. Potential risks, benefits, and complications of the surgery were discussed with the patient and she agreed to proceed with surgery. Pre operative carotid duplex US  showed no significant internal carotid artery stenosis bilaterally.   Hospital Course: Patient underwent a CABG x 2 by Dr. Lucas on 02/18/24.  Following the procedure she separated form cardiopulmonary bypass requiring no inotropic support. Cleviprex  was used to manage hypertension early post-op. She was transferred to the ICU instable condition.

## 2024-02-13 NOTE — Discharge Instructions (Signed)

## 2024-02-13 NOTE — Progress Notes (Signed)
 Anesthesia Chart Review:  Case: [id number] Date/Time: 02/18/24 0715   Procedures:      CORONARY ARTERY BYPASS GRAFTING (CABG) (Chest)     ECHOCARDIOGRAM, TRANSESOPHAGEAL, INTRAOPERATIVE   Anesthesia type: General   Diagnosis: Coronary artery disease involving native coronary artery of native heart without angina pectoris [I25.10]   Pre-op diagnosis: CAD   [facility] ROOM 15 / [facility]   Surgeons: Lucas Dorise POUR, MD       DISCUSSION: Patient is a 73 year old female scheduled for the above procedure.  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  Reported last Plavix  dose scheduled for 02/12/2024.  BP elevated PAT.  Patient was asymptomatic.  She had not yet taken her BP medications and reported she would go home after appointment and take as prescribed.  Anesthesia team to evaluate on the day of surgery.   VS: BP (!) 192/82   Pulse (!) 54   Temp 36.7 C   Resp 16   Ht 5' 1 (1.549 m)   Wt 53.1 kg   SpO2 99%   BMI 22.11 kg/m  She had not taken BP medications yet.   PROVIDERS: Husain, Karrar, MD is PCP Court Carrier, MD is cardiologist Meade Daniels, MD is pulmonologist Timmy Coy, MD is HEM   LABS: Labs reviewed: Acceptable for surgery. (all labs ordered are listed, but only abnormal results are displayed)  Labs Reviewed  COMPREHENSIVE METABOLIC PANEL WITH GFR - Abnormal; Notable for the following components:      Result Value   Sodium 133 (*)    Chloride 92 (*)    Glucose, Bld 103 (*)    All other components within normal limits  APTT - Abnormal; Notable for the following components:   aPTT 40 (*)    All other components within  normal limits  SURGICAL PCR SCREEN  CBC  PROTIME-INR  URINALYSIS, ROUTINE W REFLEX MICROSCOPIC  HEMOGLOBIN A1C  TYPE AND SCREEN    PFTs 12/03/2023: FVC 1.85 (73%), post 2.05 (81%) FEV1 1.17 (61%), post 1.36 (71%) DLCO unc 10.51 (60%), cor 10.35 (59%)   IMAGES: CXR 02/12/2024: FINDINGS: The cardiomediastinal contours are normal. Coronary stent visualized. Aortic atherosclerosis. Pulmonary vasculature is normal. No consolidation, pleural effusion, or pneumothorax. No acute osseous abnormalities are seen. IMPRESSION: No active cardiopulmonary disease.   EKG: 02/12/2024: Sinus bradycardia at 55 bpm with 1st degree A-V block When compared with ECG of 03-Jan-2024 10:04, PREVIOUS ECG IS PRESENT No significant change since last tracing Confirmed by Alvan Carrier [phone]) on 02/12/2024 7:20:55 PM    CV: US  Carotid 02/12/2024: Summary:  - Right Carotid: Velocities in the right ICA are consistent with a 1-39%  stenosis.  - Left Carotid: Velocities in the left ICA are consistent with a 1-39%  stenosis.  - Vertebrals: Bilateral vertebral arteries demonstrate antegrade flow.  - Subclavians: Normal flow hemodynamics were seen in bilateral subclavian               arteries.    Echo 01/15/2024: IMPRESSIONS   1. Left ventricular ejection fraction, by estimation, is 55 to 60%. Left  ventricular ejection fraction by 2D MOD biplane is 56.1 %. The left  ventricle has normal function. The left ventricle has no regional wall  motion abnormalities.  Left ventricular  diastolic parameters were normal.   2. Right ventricular systolic function is normal. The right ventricular  size is normal. Tricuspid regurgitation signal is inadequate for assessing  PA pressure.   3. The mitral valve is normal in structure. Trivial mitral valve  regurgitation. No evidence of mitral stenosis.   4. The aortic valve is tricuspid. Aortic valve regurgitation is not  visualized. No aortic stenosis is present.    5. The inferior vena cava is normal in size with greater than 50%  respiratory variability, suggesting right atrial pressure of 3 mmHg.    Cardiac cath 01/03/2024:   Prox Cx lesion is 10% stenosed.   Mid Cx to Dist Cx lesion is 99% stenosed.   Prox LAD to Mid LAD lesion is 60% stenosed.   2nd Diag lesion is 40% stenosed.   Dist LM to Ost LAD lesion is 40% stenosed.   Prox RCA to Mid RCA lesion is 30% stenosed.   Mid RCA to Dist RCA lesion is 90% stenosed.   Mid LAD lesion is 70% stenosed.   The left ventricular systolic function is normal.   LV end diastolic pressure is mildly elevated.   The left ventricular ejection fraction is 55-65% by visual estimate.   1.  Severe three-vessel coronary artery disease.  The LAD disease appeared borderline but was highly significant with both virtual FFR and RFR.  RFR was 0.78.  There is subtotal occlusion of the mid to distal left circumflex stents and high-grade stenosis in the mid to distal right coronary artery.  The coronary arteries are moderately to severely calcified. 2.  Normal LV systolic function mildly elevated left ventricular end-diastolic pressure. 3.  Not able to get into the descending aorta to image the right iliac stenosis due to tortuosity and significant radial artery spasm.   Recommendations: Recommend evaluation for CABG.  Clopidogrel  can be discontinued once surgery is scheduled.    Cardiac event monitor 06/05/2019: Normal sinus rhthm and sinus bradycardia No arrhythmia, i.e. no atrial fibrillation. No complaints     Past Medical History:  Diagnosis Date   Anxiety    Arthritis    CAD (coronary artery disease)    a. s/p LCX stent 2010 with residual disease.   Carotid artery disease    Cataract    CHF (congestive heart failure) (HCC)    COPD (chronic obstructive pulmonary disease) (HCC)    Depression    GERD (gastroesophageal reflux disease)    Hepatitis C    from blood transfusion in 70s per pt report- treated  for this   History of carotid endarterectomy    Hyperlipidemia    borderline, I take a pill every other day   Hypertension    Noncompliance with medication regimen    Osteopenia    Polycythemia    Polycythemia vera (HCC)    Pre-diabetes    PVD (peripheral vascular disease)    Seizures (HCC) 1970   x1- pt. reports that there was never any causative agent     TIA (transient ischemic attack)    UC (ulcerative colitis) (HCC)     Past Surgical History:  Procedure Laterality Date   2 stints  10/15/2008   Dr. Nanda   ABDOMINAL SURGERY  [phone]   for excessive bleeding from loss of pregnancy, required blood transfusion post procedure    CAROTID ENDARTERECTOMY Right 2007   cartoid endartherectomy Left 2007   CORONARY ANGIOPLASTY WITH STENT PLACEMENT  2010   CORONARY ANGIOPLASTY WITH STENT PLACEMENT  10/09/2008   CORONARY PRESSURE/FFR STUDY N/A 01/03/2024   Procedure: CORONARY PRESSURE/FFR STUDY;  Surgeon: Darron Deatrice LABOR, MD;  Location: [facility] INVASIVE CV LAB;  Service: Cardiovascular;  Laterality: N/A;   CORONARY PRESSURE/FFR WITH 3D MAPPING N/A 01/03/2024   Procedure: Coronary Pressure/FFR w/3D Mapping;  Surgeon: Darron Deatrice LABOR, MD;  Location: [facility] INVASIVE CV LAB;  Service: Cardiovascular;  Laterality: N/A;   ENDARTERECTOMY Left 07/24/2016   Procedure: ENDARTERECTOMY CAROTID;  Surgeon: Oris Krystal FALCON, MD;  Location[facility] OR;  Service: Vascular;  Laterality: Left;   FLEXIBLE SIGMOIDOSCOPY N/A 03/22/2017   Procedure: FLEXIBLE SIGMOIDOSCOPY;  Surgeon: Rollin Dover, MD;  Location: [facility] ENDOSCOPY;  Service: Endoscopy;  Laterality: N/A;   LEFT HEART CATH AND CORONARY ANGIOGRAPHY N/A 01/03/2024   Procedure: LEFT HEART CATH AND CORONARY ANGIOGRAPHY;  Surgeon: Darron Deatrice LABOR, MD;  Location: [facility] INVASIVE CV LAB;  Service: Cardiovascular;  Laterality: N/A;   LYMPH NODE DISSECTION Left 1990's   benign    ORIF ELBOW FRACTURE Right 04/26/2018   Procedure: OPEN REDUCTION INTERNAL FIXATION (ORIF)  ELBOW/OLECRANON FRACTURE;  Surgeon: Kendal Franky SQUIBB, MD;  Location: [facility] OR;  Service: Orthopedics;  Laterality: Right;   TUBAL LIGATION      MEDICATIONS:  clopidogrel  (PLAVIX ) 75 MG tablet   acetaminophen  (TYLENOL ) 325 MG tablet   albuterol  (VENTOLIN  HFA) 108 (90 Base) MCG/ACT inhaler   ALPRAZolam  (XANAX ) 0.5 MG tablet   ascorbic acid (VITAMIN C) 500 MG tablet   clotrimazole -betamethasone  (LOTRISONE ) cream   escitalopram  (LEXAPRO ) 10 MG tablet   hydrochlorothiazide  (HYDRODIURIL ) 25 MG tablet   lansoprazole (PREVACID) 30 MG capsule   metoprolol  succinate (TOPROL -XL) 100 MG 24 hr tablet   nitroGLYCERIN  (NITROSTAT ) 0.4 MG SL tablet   rosuvastatin  (CRESTOR ) 10 MG tablet   telmisartan  (MICARDIS ) 80 MG tablet   Tiotropium Bromide-Olodaterol (STIOLTO RESPIMAT ) 2.5-2.5 MCG/ACT AERS   Trolamine Salicylate (BLUE-EMU HEMP EX)   No current facility-administered medications for this encounter.    Isaiah Ruder, PA-C Surgical Short Stay/Anesthesiology [facility] Phone [phone] [facility] Phone [phone] 02/13/2024 12:49 PM

## 2024-02-13 NOTE — Anesthesia Preprocedure Evaluation (Addendum)
"                                    Anesthesia Evaluation  Patient identified by MRN, date of birth, ID band Patient awake    Reviewed: Allergy & Precautions, NPO status , Patient's Chart, lab work & pertinent test results  History of Anesthesia Complications Negative for: history of anesthetic complications  Airway Mallampati: II  TM Distance: >3 FB Neck ROM: Full    Dental  (+) Dental Advisory Given, Partial Upper, Partial Lower   Pulmonary COPD,  COPD inhaler, Current Smoker and Patient abstained from smoking.   Pulmonary exam normal        Cardiovascular hypertension, Pt. on medications and Pt. on home beta blockers + angina  + CAD, + Cardiac Stents and + Peripheral Vascular Disease  Normal cardiovascular exam   '25 TTE - EF 55 to 60%. Trivial MR  '25 Cath - Prox Cx lesion is 10% stenosed.   Mid Cx to Dist Cx lesion is 99% stenosed.   Prox LAD to Mid LAD lesion is 60% stenosed.   2nd Diag lesion is 40% stenosed.   Dist LM to Ost LAD lesion is 40% stenosed.   Prox RCA to Mid RCA lesion is 30% stenosed.   Mid RCA to Dist RCA lesion is 90% stenosed.   Mid LAD lesion is 70% stenosed.   The left ventricular systolic function is normal.   LV end diastolic pressure is mildly elevated.    Neuro/Psych Seizures -, Well Controlled,  PSYCHIATRIC DISORDERS Anxiety Depression    TIA   GI/Hepatic PUD,GERD  Medicated and Controlled,,(+) Hepatitis -, C UC    Endo/Other   Pre-DM   Renal/GU negative Renal ROS     Musculoskeletal  (+) Arthritis ,    Abdominal   Peds  Hematology  Polycythemia vera Plavix     Anesthesia Other Findings   Reproductive/Obstetrics                              Anesthesia Physical Anesthesia Plan  ASA: 4  Anesthesia Plan: General   Post-op Pain Management:    Induction: Intravenous  PONV Risk Score and Plan: 3 and Treatment may vary due to age or medical condition  Airway  Management Planned: Oral ETT  Additional Equipment: Arterial line, CVP, PA Cath, TEE and Ultrasound Guidance Line Placement  Intra-op Plan:   Post-operative Plan: Post-operative intubation/ventilation  Informed Consent: I have reviewed the patients History and Physical, chart, labs and discussed the procedure including the risks, benefits and alternatives for the proposed anesthesia with the patient or authorized representative who has indicated his/her understanding and acceptance.     Dental advisory given  Plan Discussed with: CRNA and Anesthesiologist  Anesthesia Plan Comments: (PAT note written 02/13/2024 by Allison Zelenak, PA-C.  )         Anesthesia Quick Evaluation  "

## 2024-02-15 MED ORDER — NITROGLYCERIN IN D5W 200-5 MCG/ML-% IV SOLN
2.0000 ug/min | INTRAVENOUS | Status: DC
  Filled 2024-02-15: qty 250

## 2024-02-15 MED ORDER — DEXMEDETOMIDINE HCL IN NACL 400 MCG/100ML IV SOLN
0.1000 ug/kg/h | INTRAVENOUS | Status: AC
  Administered 2024-02-18: .7 ug/kg/h via INTRAVENOUS
  Filled 2024-02-15: qty 100

## 2024-02-15 MED ORDER — MILRINONE LACTATE IN DEXTROSE 20-5 MG/100ML-% IV SOLN
0.3000 ug/kg/min | INTRAVENOUS | Status: DC
  Filled 2024-02-15: qty 100

## 2024-02-15 MED ORDER — PHENYLEPHRINE HCL-NACL 20-0.9 MG/250ML-% IV SOLN
30.0000 ug/min | INTRAVENOUS | Status: AC
  Administered 2024-02-18: 25 ug/min via INTRAVENOUS
  Filled 2024-02-15: qty 250

## 2024-02-15 MED ORDER — MAGNESIUM SULFATE 50 % IJ SOLN
40.0000 meq | INTRAMUSCULAR | Status: DC
  Filled 2024-02-15: qty 9.85

## 2024-02-15 MED ORDER — INSULIN REGULAR(HUMAN) IN NACL 100-0.9 UT/100ML-% IV SOLN
INTRAVENOUS | Status: AC
  Administered 2024-02-18: .7 [IU]/h via INTRAVENOUS
  Filled 2024-02-15: qty 100

## 2024-02-15 MED ORDER — CEFAZOLIN SODIUM-DEXTROSE 2-4 GM/100ML-% IV SOLN
2.0000 g | INTRAVENOUS | Status: DC
  Filled 2024-02-15 (×2): qty 100

## 2024-02-15 MED ORDER — PLASMA-LYTE A IV SOLN
INTRAVENOUS | Status: DC
  Filled 2024-02-15: qty 2.5

## 2024-02-15 MED ORDER — HEPARIN 30,000 UNITS/1000 ML (OHS) CELLSAVER SOLUTION
Status: DC
  Filled 2024-02-15: qty 1000

## 2024-02-15 MED ORDER — TRANEXAMIC ACID (OHS) PUMP PRIME SOLUTION
2.0000 mg/kg | INTRAVENOUS | Status: DC
  Filled 2024-02-15: qty 1.06

## 2024-02-15 MED ORDER — POTASSIUM CHLORIDE 2 MEQ/ML IV SOLN
80.0000 meq | INTRAVENOUS | Status: DC
  Filled 2024-02-15: qty 40

## 2024-02-15 MED ORDER — TRANEXAMIC ACID 1000 MG/10ML IV SOLN
1.5000 mg/kg/h | INTRAVENOUS | Status: AC
  Administered 2024-02-18: 1.5 mg/kg/h via INTRAVENOUS
  Filled 2024-02-15: qty 25

## 2024-02-15 MED ORDER — NOREPINEPHRINE 4 MG/250ML-% IV SOLN
0.0000 ug/min | INTRAVENOUS | Status: DC
  Filled 2024-02-15: qty 250

## 2024-02-15 MED ORDER — EPINEPHRINE HCL 5 MG/250ML IV SOLN IN NS
0.0000 ug/min | INTRAVENOUS | Status: DC
  Filled 2024-02-15: qty 250

## 2024-02-15 MED ORDER — TRANEXAMIC ACID (OHS) BOLUS VIA INFUSION
15.0000 mg/kg | INTRAVENOUS | Status: AC
  Administered 2024-02-18: 796.5 mg via INTRAVENOUS
  Filled 2024-02-15: qty 797

## 2024-02-15 MED ORDER — CEFAZOLIN SODIUM-DEXTROSE 2-4 GM/100ML-% IV SOLN
2.0000 g | INTRAVENOUS | Status: AC
  Administered 2024-02-18 (×2): 2 g via INTRAVENOUS
  Filled 2024-02-15: qty 100

## 2024-02-15 MED ORDER — VANCOMYCIN HCL IN DEXTROSE 1-5 GM/200ML-% IV SOLN
1000.0000 mg | INTRAVENOUS | Status: AC
  Administered 2024-02-18: 1000 mg via INTRAVENOUS
  Filled 2024-02-15: qty 200

## 2024-02-17 NOTE — H&P (Signed)
 "  [address], [name] [address]             [phone]     Cardiothoracic Surgery Admission History and Physical   PCP is Ransom Other, MD Referring Provider is Court Dorn PARAS, MD      Chief Complaint  Patient presents with   Coronary Artery Disease      HPI:   The patient is a 73 year old woman with a history of hypertension, hyperlipidemia, COPD and active smoking, carotid artery disease status post bilateral carotid endarterectomy, polycythemia vera treated by Dr. Timmy, peripheral vascular disease with recent vascular ultrasound showing greater than 70% stenosis at the origin of the right CIA with right buttock and thigh claudication, coronary artery disease status post left circumflex stenting in 2010, possible recent TIA with numbness on the left side of the body, and multivessel coronary disease.  She presents with episodes of substernal chest discomfort requiring sublingual nitroglycerin .  She had a Myoview  stress test on 12/13/2023 that was intermediate risk consistent with ischemia in the left circumflex marginal or posterolateral branch.  Cardiac catheterization on 01/03/2024 showed severe three-vessel coronary disease with 99% mid to distal left circumflex stenosis within a previously placed stent.  There is about 40% stenosis at the ostium of the LAD and 60% proximal, 70% mid vessel stenosis in the LAD.  The right coronary artery has a 90% mid to distal stenosis.  Left ventricular ejection fraction on echocardiogram was 55 to 60% with trivial mitral regurgitation.         Past Medical History:  Diagnosis Date   Anxiety     Arthritis     CAD (coronary artery disease)      a. s/p LCX stent 2010 with residual disease.   Carotid artery disease     Cataract     CHF (congestive heart failure) (HCC)     COPD (chronic obstructive pulmonary disease) (HCC)     Depression     GERD (gastroesophageal reflux disease)     Hepatitis C     History  of carotid endarterectomy     Hyperlipidemia      borderline, I take a pill every other day   Hypertension     Noncompliance with medication regimen     Osteopenia     Polycythemia     Polycythemia vera (HCC)     PVD (peripheral vascular disease)     Seizures (HCC) 1970    x1- pt. reports that there was never any causative agent     TIA (transient ischemic attack)     UC (ulcerative colitis) (HCC)                 Past Surgical History:  Procedure Laterality Date   2 stints   10/15/2008    Dr. Nanda   ABDOMINAL SURGERY   [phone]    for excessive bleeding from loss of pregnancy, required blood transfusion post procedure    CAROTID ENDARTERECTOMY Right 2007   cartoid endartherectomy   2007   CORONARY ANGIOPLASTY WITH STENT PLACEMENT   2010   CORONARY ANGIOPLASTY WITH STENT PLACEMENT   10/09/2008   CORONARY PRESSURE/FFR STUDY N/A 01/03/2024    Procedure: CORONARY PRESSURE/FFR STUDY;  Surgeon: Darron Deatrice LABOR, MD;  Location: [facility];  Service: Cardiovascular;  Laterality: N/A;   CORONARY PRESSURE/FFR WITH 3D MAPPING N/A 01/03/2024    Procedure: Coronary Pressure/FFR w/3D Mapping;  Surgeon: Darron Deatrice LABOR,  MD;  Location: [facility];  Service: Cardiovascular;  Laterality: N/A;   ENDARTERECTOMY Left 07/24/2016    Procedure: ENDARTERECTOMY CAROTID;  Surgeon: Oris Krystal FALCON, MD;  Location: [facility] OR;  Service: Vascular;  Laterality: Left;   FLEXIBLE SIGMOIDOSCOPY N/A 03/22/2017    Procedure: FLEXIBLE SIGMOIDOSCOPY;  Surgeon: Rollin Dover, MD;  Location: [facility] ENDOSCOPY;  Service: Endoscopy;  Laterality: N/A;   LEFT HEART CATH AND CORONARY ANGIOGRAPHY N/A 01/03/2024    Procedure: LEFT HEART CATH AND CORONARY ANGIOGRAPHY;  Surgeon: Darron Deatrice LABOR, MD;  Location: [facility];  Service: Cardiovascular;  Laterality: N/A;   LYMPH NODE DISSECTION Left 1990's    benign    ORIF ELBOW FRACTURE Right 04/26/2018    Procedure: OPEN REDUCTION INTERNAL FIXATION (ORIF)  ELBOW/OLECRANON FRACTURE;  Surgeon: Kendal Franky SQUIBB, MD;  Location: [facility] OR;  Service: Orthopedics;  Laterality: Right;   TUBAL LIGATION                   Family History  Problem Relation Age of Onset   Heart disease Mother     Non-Hodgkin's lymphoma Father          and lung cancer   Heart disease Father     Liver disease Neg Hx     Esophageal cancer Neg Hx     Colon cancer Neg Hx     Rectal cancer Neg Hx     Stomach cancer Neg Hx            Social History Social History  Social History         Tobacco Use   Smoking status: Some Days      Current packs/day: 0.50      Average packs/day: 1.5 packs/day for 57.9 years (86.0 ttl pk-yrs)      Types: Cigarettes, E-cigarettes      Start date: 1968      Last attempt to quit: 2025      Passive exposure: Never   Smokeless tobacco: Current   Tobacco comments:      Vape and occasional smoking   Vaping Use   Vaping status: Some Days   Start date: 08/07/2020   Substances: Nicotine   Substance Use Topics   Alcohol use: Not Currently      Comment: socially occasionally   Drug use: Not Currently      Comment: has smoked cigarettes off and on              Current Outpatient Medications  Medication Sig Dispense Refill   acetaminophen  (TYLENOL ) 325 MG tablet Take 325-650 mg by mouth every 6 (six) hours as needed for mild pain or headache.       albuterol  (VENTOLIN  HFA) 108 (90 Base) MCG/ACT inhaler Inhale 1-2 puffs into the lungs every 4 (four) hours as needed for wheezing or shortness of breath.       ALPRAZolam  (XANAX ) 0.5 MG tablet Take 0.5 mg by mouth at bedtime.        Ascorbic Acid (VITAMIN C PO) Take 2 tablets by mouth daily.       clopidogrel  (PLAVIX ) 75 MG tablet Take 1 tablet by mouth once daily 90 tablet 3   clotrimazole -betamethasone  (LOTRISONE ) cream Apply 1 application  topically 2 (two) times daily as needed (to affected area).       escitalopram  (LEXAPRO ) 10 MG tablet Take 10 mg by mouth daily.        hydrochlorothiazide  (HYDRODIURIL ) 25 MG tablet Take 12.5 mg by  mouth daily.       lansoprazole (PREVACID) 30 MG capsule Take 30 mg by mouth daily as needed (for ulcer/acid reflex). Ulcer/ acid reflux       metoprolol  succinate (TOPROL -XL) 100 MG 24 hr tablet TAKE 1 & 1/2 (ONE & ONE-HALF) TABLETS BY MOUTH ONCE DAILY . Please make yearly appt with Dr. Claudene for May for future refills. 1st attempt (Patient taking differently: Take 150 mg by mouth See admin instructions. 100mg  at 2pm and 50mg  at bedtime (splits one of the 100mg  tablets)) 135 tablet 1   nitroGLYCERIN  (NITROSTAT ) 0.4 MG SL tablet Place 0.4 mg under the tongue every 5 (five) minutes as needed for chest pain.       rosuvastatin  (CRESTOR ) 10 MG tablet Take 1 tablet (10 mg total) by mouth daily. (Patient taking differently: Take 10 mg by mouth every other day.) 90 tablet 3   telmisartan  (MICARDIS ) 80 MG tablet Take 1 tablet (80 mg total) by mouth daily. 90 tablet 3   Tiotropium Bromide-Olodaterol (STIOLTO RESPIMAT ) 2.5-2.5 MCG/ACT AERS Inhale 2 puffs into the lungs daily. 1 each 5   Trolamine Salicylate (BLUE-EMU HEMP EX) Apply 1 Application topically daily as needed (joint pain).       aspirin  EC 81 MG tablet Take 81 mg by mouth once. Swallow whole.  For heart cath (Patient not taking: Reported on 01/16/2024)          No current facility-administered medications for this visit.        Allergies       Allergies  Allergen Reactions   Diphenhydramine Hives      Reaction was many years ago, I have taken one since for a bee sting and had no issues.   Sulfa  Antibiotics Nausea And Vomiting   Sulfur Nausea Only   Amlodipine  Besylate Other (See Comments)      headaches   Nsaids Other (See Comments)      She doesn't take Nsaids because it runs up her BP.   Sulfamethoxazole -Trimethoprim  Nausea And Vomiting        Review of Systems  Constitutional:  Positive for activity change and fatigue.  HENT: Negative.    Eyes: Negative.    Respiratory:  Positive for shortness of breath.   Cardiovascular:  Positive for chest pain. Negative for leg swelling.       Claudication in the right buttock and thigh.  Gastrointestinal: Negative.   Endocrine: Negative.   Genitourinary: Negative.   Musculoskeletal: Negative.   Skin: Negative.   Allergic/Immunologic: Negative.   Neurological:  Positive for numbness. Negative for dizziness and syncope.  Hematological: Negative.   Psychiatric/Behavioral:  The patient is nervous/anxious.        Depression     BP (!) 165/91 (BP Location: Left Arm)   Pulse (!) 59   Resp 18   Ht 5' 1 (1.549 m)   Wt 115 lb (52.2 kg)   SpO2 95%   BMI 21.73 kg/m  Physical Exam Constitutional:      Appearance: Normal appearance. She is normal weight.  HENT:     Head: Normocephalic and atraumatic.  Eyes:     Extraocular Movements: Extraocular movements intact.     Conjunctiva/sclera: Conjunctivae normal.     Pupils: Pupils are equal, round, and reactive to light.  Cardiovascular:     Rate and Rhythm: Normal rate and regular rhythm.     Heart sounds: Normal heart sounds. No murmur heard.    Comments: Right pedal pulses not palpable.  Left pedal pulses palpable Pulmonary:     Effort: Pulmonary effort is normal.     Breath sounds: Normal breath sounds.  Abdominal:     General: There is no distension.     Tenderness: There is no abdominal tenderness.  Musculoskeletal:        General: No swelling.  Skin:    General: Skin is warm and dry.  Neurological:     General: No focal deficit present.     Mental Status: She is alert and oriented to person, place, and time.  Psychiatric:        Mood and Affect: Mood normal.        Behavior: Behavior normal.         Diagnostic Tests:   ECHOCARDIOGRAM REPORT       Patient Name:   POLETTE NOFSINGER Date of Exam: 01/15/2024  Medical Rec #:  [id number]        Height:       61.0 in  Accession #:    [id number]       Weight:       116.0 lb  Date of  Birth:  01-23-1951         BSA:          1.498 m  Patient Age:    73 years         BP:           118/73 mmHg  Patient Gender: F                HR:           63 bpm.  Exam Location:  Inpatient   Procedure: 2D Echo (Both Spectral and Color Flow Doppler were utilized  during            procedure).   Indications:    CAD    History:        Patient has prior history of Echocardiogram examinations.  CAD.    Sonographer:   Norleen Amour  Referring Phys: [phone] JONATHAN J BERRY   IMPRESSIONS     1. Left ventricular ejection fraction, by estimation, is 55 to 60%. Left  ventricular ejection fraction by 2D MOD biplane is 56.1 %. The left  ventricle has normal function. The left ventricle has no regional wall  motion abnormalities. Left ventricular  diastolic parameters were normal.   2. Right ventricular systolic function is normal. The right ventricular  size is normal. Tricuspid regurgitation signal is inadequate for assessing  PA pressure.   3. The mitral valve is normal in structure. Trivial mitral valve  regurgitation. No evidence of mitral stenosis.   4. The aortic valve is tricuspid. Aortic valve regurgitation is not  visualized. No aortic stenosis is present.   5. The inferior vena cava is normal in size with greater than 50%  respiratory variability, suggesting right atrial pressure of 3 mmHg.   FINDINGS   Left Ventricle: Left ventricular ejection fraction, by estimation, is 55  to 60%. Left ventricular ejection fraction by 2D MOD biplane is 56.1 %.  The left ventricle has normal function. The left ventricle has no regional  wall motion abnormalities. The  left ventricular internal cavity size was normal in size. There is no left  ventricular hypertrophy. Left ventricular diastolic parameters were  normal.   Right Ventricle: The right ventricular size is normal. No increase in  right ventricular wall thickness. Right ventricular systolic function is  normal. Tricuspid  regurgitation signal is  inadequate for assessing PA  pressure.   Left Atrium: Left atrial size was normal in size.   Right Atrium: Right atrial size was normal in size.   Pericardium: There is no evidence of pericardial effusion.   Mitral Valve: The mitral valve is normal in structure. Trivial mitral  valve regurgitation. No evidence of mitral valve stenosis. MV peak  gradient, 2.1 mmHg. The mean mitral valve gradient is 1.0 mmHg.   Tricuspid Valve: The tricuspid valve is normal in structure. Tricuspid  valve regurgitation is trivial. No evidence of tricuspid stenosis.   Aortic Valve: The aortic valve is tricuspid. Aortic valve regurgitation is  not visualized. No aortic stenosis is present.   Pulmonic Valve: The pulmonic valve was normal in structure. Pulmonic valve  regurgitation is trivial. No evidence of pulmonic stenosis.   Aorta: The aortic root is normal in size and structure.   Venous: The inferior vena cava is normal in size with greater than 50%  respiratory variability, suggesting right atrial pressure of 3 mmHg.   IAS/Shunts: No atrial level shunt detected by color flow Doppler.     LEFT VENTRICLE  PLAX 2D                        Biplane EF (MOD)  LVIDd:         4.10 cm         LV Biplane EF:   Left  LVIDs:         2.60 cm                          ventricular  LV PW:         1.00 cm                          ejection  LV IVS:        0.80 cm                          fraction by  LVOT diam:     1.65 cm                          2D MOD  LV SV:         39                               biplane is  LV SV Index:   26                               56.1 %.  LVOT Area:     2.14 cm                                 Diastology                                 LV e' medial:    7.51 cm/s  LV Volumes (MOD)               LV E/e' medial:  10.1  LV vol d, MOD    54.1 ml  LV e' lateral:   7.94 cm/s  A2C:                           LV E/e' lateral: 9.6  LV vol d, MOD    48.4 ml   A4C:  LV vol s, MOD    21.6 ml  A2C:  LV vol s, MOD    22.5 ml  A4C:  LV SV MOD A2C:   32.5 ml  LV SV MOD A4C:   48.4 ml  LV SV MOD BP:    29.6 ml   RIGHT VENTRICLE            IVC  RV Basal diam:  2.52 cm    IVC diam: 1.43 cm  RV S prime:     9.36 cm/s  TAPSE (M-mode): 1.8 cm     PULMONARY VEINS                             Diastolic Velocity: 43.10 cm/s                             S/D Velocity:       1.20                             Systolic Velocity:  53.80 cm/s   LEFT ATRIUM             Index        RIGHT ATRIUM          Index  LA diam:        2.86 cm 1.91 cm/m   RA Area:     6.66 cm  LA Vol (A2C):   42.7 ml 28.50 ml/m  RA Volume:   10.80 ml 7.21 ml/m  LA Vol (A4C):   33.3 ml 22.22 ml/m  LA Biplane Vol: 40.0 ml 26.69 ml/m   AORTIC VALVE             PULMONIC VALVE  LVOT Vmax:   71.30 cm/s  PV Vmax:       0.79 m/s  LVOT Vmean:  52.900 cm/s PV Peak grad:  2.5 mmHg  LVOT VTI:    0.182 m    AORTA  Ao Root diam: 2.20 cm  Ao Asc diam:  2.44 cm   MITRAL VALVE  MV Area (PHT): 3.24 cm    SHUNTS  MV Area VTI:   1.64 cm    Systemic VTI:  0.18 m  MV Peak grad:  2.1 mmHg    Systemic Diam: 1.65 cm  MV Mean grad:  1.0 mmHg  MV Vmax:       0.72 m/s  MV Vmean:      45.8 cm/s  MV Decel Time: 234 msec  MV E velocity: 75.90 cm/s  MV A velocity: 80.50 cm/s  MV E/A ratio:  0.94   Soyla Merck MD  Electronically signed by Soyla Merck MD  Signature Date/Time: 01/16/2024/10:11:27 AM      Procedures   LEFT HEART CATH AND CORONARY ANGIOGRAPHY  Coronary Pressure/FFR w/3D Mapping  CORONARY PRESSURE/FFR STUDY    Conclusion       Prox Cx lesion is 10% stenosed.   Mid Cx to Dist Cx lesion is 99% stenosed.   Prox LAD to Mid LAD lesion is 60%  stenosed.   2nd Diag lesion is 40% stenosed.   Dist LM to Ost LAD lesion is 40% stenosed.   Prox RCA to Mid RCA lesion is 30% stenosed.   Mid RCA to Dist RCA lesion is 90% stenosed.   Mid LAD lesion is 70% stenosed.   The left  ventricular systolic function is normal.   LV end diastolic pressure is mildly elevated.   The left ventricular ejection fraction is 55-65% by visual estimate.   1.  Severe three-vessel coronary artery disease.  The LAD disease appeared borderline but was highly significant with both virtual FFR and RFR.  RFR was 0.78.  There is subtotal occlusion of the mid to distal left circumflex stents and high-grade stenosis in the mid to distal right coronary artery.  The coronary arteries are moderately to severely calcified. 2.  Normal LV systolic function mildly elevated left ventricular end-diastolic pressure. 3.  Not able to get into the descending aorta to image the right iliac stenosis due to tortuosity and significant radial artery spasm.   Recommendations: Recommend evaluation for CABG.  Clopidogrel  can be discontinued once surgery is scheduled.   Procedural Details   Technical Details Procedural Details: The right wrist was prepped, draped, and anesthetized with 1% lidocaine . Using the modified Seldinger technique, a 5 French Slender sheath was introduced into the right radial artery. 3 mg of verapamil  was administered through the sheath, weight-based unfractionated heparin  was administered intravenously. A TIG4 catheter was used for selective coronary angiography and left ventriculography.  The patient had significant right radial artery spasm.  I given additional dose of 3 mg verapamil  and 200 mcg of nitroglycerin .  The arterial sheath.  Catheter exchanges were performed over an exchange length guidewire. There were no immediate procedural complications.  I attempted to get into the descending aorta to image the right iliac stenosis but I could not get any catheter to go down the descending aorta given tortuosity and radial artery spasm.  PCI Note:  Following the diagnostic procedure, the decision was made to proceed with flow reserve evaluation of the LAD.  This was done with cath works and also  with pressure wire based.  The pressure X wire was used.   The patient tolerated the procedure well. There were no immediate procedural complications. A TR band was used for radial hemostasis. The patient was transferred to the post catheterization recovery area for further monitoring.   Estimated blood loss <50 mL.   During this procedure medications were administered to achieve and maintain moderate conscious sedation while the patient's heart rate, blood pressure, and oxygen saturation were continuously monitored and I was present face-to-face 100% of this time. From  8:53 AM to  9:47 AM, Juliene Hicks RN and Shasta Bevely Gosling Cardiovascular Specialist, who are independent, trained observers, assisted in the monitoring of the patient's level of consciousness.    Medications (Filter: Administrations occurring from [phone] to 1005 on 01/03/24) Heparin  (Porcine) in NaCl 1000-0.9 UT/500ML-% SOLN (mL)  Total volume: 1,000 mL Date/Time Rate/Dose/Volume Action    01/03/24 0836 500 mL Given    0836 500 mL Given    midazolam  PF (VERSED ) injection (mg)  Total dose: 2 mg Date/Time Rate/Dose/Volume Action    01/03/24 0853 1 mg Given    0920 1 mg Given    fentaNYL  (SUBLIMAZE ) injection (mcg)  Total dose: 50 mcg Date/Time Rate/Dose/Volume Action    01/03/24 0853 25 mcg Given    0920 25 mcg Given    lidocaine  (PF) (  XYLOCAINE ) 1 % injection (mL)  Total volume: 5 mL Date/Time Rate/Dose/Volume Action    01/03/24 0900 5 mL Given    Radial Cocktail/Verapamil  only (mL)  Total volume: 20 mL Date/Time Rate/Dose/Volume Action    01/03/24 0902 10 mL Given    0921 10 mL Given    heparin  sodium (porcine) injection (Units)  Total dose: 5,500 Units Date/Time Rate/Dose/Volume Action    01/03/24 0905 2,500 Units Given    0923 3,000 Units Given    nitroGLYCERIN  1 mg/10 mL (100 mcg/mL) - IR/CATH LAB (mcg)  Total dose: 200 mcg Date/Time Rate/Dose/Volume Action    01/03/24 0923 200 mcg Given    iohexol   (OMNIPAQUE ) 350 MG/ML injection (mL)  Total volume: 90 mL Date/Time Rate/Dose/Volume Action    01/03/24 0948 90 mL Given      Sedation Time   Sedation Time Physician-1: 54 minutes 4 seconds Contrast        Administrations occurring from 0835 to 1005 on 01/03/24:  Medication Name Total Dose  iohexol  (OMNIPAQUE ) 350 MG/ML injection 90 mL    Radiation/Fluoro   Fluoro time: 14.8 (min) DAP: 17401 (mGycm2) Cumulative Air Kerma: 355 (mGy) Complications   Complications documented before study signed (01/03/2024 10:12 AM)    No complications were associated with this study.  Documented by Bevely Gosling, Shasta Terry Saunas - 01/03/2024  9:49 AM      Coronary Findings   Diagnostic Dominance: Right Left Main  Dist LM to Ost LAD lesion is 40% stenosed.    Left Anterior Descending  Prox LAD to Mid LAD lesion is 60% stenosed.  Mid LAD lesion is 70% stenosed. Pressure gradient was performed on the lesion using a GUIDEWIRE PRESSURE X 175 and CATH LAUNCHER 85F EBU3.0. RFR: 0.79. Virtual FFR: 78.    Second Diagonal Branch  Vessel is large in size.  2nd Diag lesion is 40% stenosed.    Left Circumflex  Prox Cx lesion is 10% stenosed. The lesion was previously treated over 2 years ago.  Mid Cx to Dist Cx lesion is 99% stenosed. The lesion was previously treated over 2 years ago. Previously placed stent displays restenosis.    Third Obtuse Marginal Branch  Collaterals  3rd Mrg filled by collaterals from Lat 1st Mrg.       Right Coronary Artery  Prox RCA to Mid RCA lesion is 30% stenosed.  Mid RCA to Dist RCA lesion is 90% stenosed.    Intervention    No interventions have been documented.    Left Heart   Left Ventricle The left ventricular size is normal. The left ventricular systolic function is normal. LV end diastolic pressure is mildly elevated. The left ventricular ejection fraction is 55-65% by visual estimate. No regional wall motion abnormalities.    Coronary Diagrams    Diagnostic Dominance: Right  Intervention    Implants    No implant documentation for this case.    Syngo Images    Show images for CARDIAC CATHETERIZATION Images on Long Term Storage    Show images for Alexandr, Oehler to Procedure Log   Procedure Log    Hemo Data   Flowsheet Row Most Recent Value  Aortic Mean Gradient 0 mmHg  Aortic Peak Gradient 0 mmHg  AO Systolic Pressure 145 mmHg  AO Diastolic Pressure 67 mmHg  AO Mean 100 mmHg  LV Systolic Pressure 182 mmHg  LV Diastolic Pressure 1 mmHg  LV EDP 24 mmHg  Arterial Occlusion Pressure Extended Systolic Pressure 149 mmHg  Arterial Occlusion Pressure Extended Diastolic Pressure 70 mmHg  Arterial Occlusion Pressure Extended Mean Pressure 102 mmHg  Left Ventricular Apex Extended Systolic Pressure 145 mmHg  LVp Diastolic Pressure 25 mmHg  Left Ventricular Apex Extended EDP Pressure 27 mmHg      Impression:   This 73 year old woman has severe three-vessel coronary disease with progressive exertional anginal symptoms as well as significant claudication symptoms in the right buttock and thigh that are limiting her mobility and will likely require intervention.  I agree that coronary bypass graft surgery is the best treatment to prevent further ischemia and infarction and improve her quality of life. I discussed the operative procedure with the patient and family including alternatives, benefits and risks; including but not limited to bleeding, blood transfusion, infection, stroke, myocardial infarction, graft failure, heart block requiring a permanent pacemaker, organ dysfunction, and death.  Avelina Presume understands and agrees to proceed.       Plan:   Coronary bypass graft surgery.  Dorise MARLA Fellers, MD [facility] [phone] "

## 2024-02-18 ENCOUNTER — Inpatient Hospital Stay (HOSPITAL_COMMUNITY): Admission: RE | Disposition: A | Payer: Self-pay | Source: Home / Self Care | Attending: Surgery

## 2024-02-18 ENCOUNTER — Inpatient Hospital Stay (HOSPITAL_COMMUNITY)

## 2024-02-18 ENCOUNTER — Inpatient Hospital Stay (HOSPITAL_COMMUNITY)
Admission: RE | Admit: 2024-02-18 | Discharge: 2024-02-23 | DRG: 236 | Disposition: A | Discharge status: Home / Self Care | Attending: Surgery | Admitting: Surgery

## 2024-02-18 ENCOUNTER — Encounter (HOSPITAL_COMMUNITY): Payer: Self-pay | Admitting: Surgery

## 2024-02-18 ENCOUNTER — Other Ambulatory Visit: Payer: Self-pay

## 2024-02-18 ENCOUNTER — Inpatient Hospital Stay (HOSPITAL_COMMUNITY): Payer: Self-pay | Admitting: Vascular Surgery

## 2024-02-18 ENCOUNTER — Inpatient Hospital Stay (HOSPITAL_COMMUNITY): Payer: Self-pay | Admitting: Anesthesiology

## 2024-02-18 DIAGNOSIS — I25119 Atherosclerotic heart disease of native coronary artery with unspecified angina pectoris: Secondary | ICD-10-CM | POA: Diagnosis not present

## 2024-02-18 DIAGNOSIS — D696 Thrombocytopenia, unspecified: Secondary | ICD-10-CM | POA: Diagnosis not present

## 2024-02-18 DIAGNOSIS — Z888 Allergy status to other drugs, medicaments and biological substances status: Secondary | ICD-10-CM

## 2024-02-18 DIAGNOSIS — J449 Chronic obstructive pulmonary disease, unspecified: Secondary | ICD-10-CM

## 2024-02-18 DIAGNOSIS — Z951 Presence of aortocoronary bypass graft: Principal | ICD-10-CM

## 2024-02-18 DIAGNOSIS — E785 Hyperlipidemia, unspecified: Secondary | ICD-10-CM | POA: Diagnosis not present

## 2024-02-18 DIAGNOSIS — Z7902 Long term (current) use of antithrombotics/antiplatelets: Secondary | ICD-10-CM

## 2024-02-18 DIAGNOSIS — D45 Polycythemia vera: Secondary | ICD-10-CM | POA: Diagnosis present

## 2024-02-18 DIAGNOSIS — I10 Essential (primary) hypertension: Secondary | ICD-10-CM

## 2024-02-18 DIAGNOSIS — I2510 Atherosclerotic heart disease of native coronary artery without angina pectoris: Secondary | ICD-10-CM | POA: Diagnosis not present

## 2024-02-18 DIAGNOSIS — E871 Hypo-osmolality and hyponatremia: Secondary | ICD-10-CM | POA: Diagnosis present

## 2024-02-18 DIAGNOSIS — F17210 Nicotine dependence, cigarettes, uncomplicated: Secondary | ICD-10-CM

## 2024-02-18 DIAGNOSIS — R7303 Prediabetes: Secondary | ICD-10-CM | POA: Diagnosis present

## 2024-02-18 DIAGNOSIS — M8580 Other specified disorders of bone density and structure, unspecified site: Secondary | ICD-10-CM | POA: Diagnosis present

## 2024-02-18 DIAGNOSIS — F17290 Nicotine dependence, other tobacco product, uncomplicated: Secondary | ICD-10-CM | POA: Diagnosis present

## 2024-02-18 DIAGNOSIS — R739 Hyperglycemia, unspecified: Secondary | ICD-10-CM | POA: Diagnosis not present

## 2024-02-18 DIAGNOSIS — J432 Centrilobular emphysema: Secondary | ICD-10-CM | POA: Diagnosis not present

## 2024-02-18 DIAGNOSIS — Z79899 Other long term (current) drug therapy: Secondary | ICD-10-CM

## 2024-02-18 DIAGNOSIS — Z955 Presence of coronary angioplasty implant and graft: Secondary | ICD-10-CM

## 2024-02-18 DIAGNOSIS — N990 Postprocedural (acute) (chronic) kidney failure: Secondary | ICD-10-CM | POA: Diagnosis not present

## 2024-02-18 DIAGNOSIS — Z886 Allergy status to analgesic agent status: Secondary | ICD-10-CM

## 2024-02-18 DIAGNOSIS — R001 Bradycardia, unspecified: Secondary | ICD-10-CM | POA: Diagnosis not present

## 2024-02-18 DIAGNOSIS — Z882 Allergy status to sulfonamides status: Secondary | ICD-10-CM

## 2024-02-18 DIAGNOSIS — Z8249 Family history of ischemic heart disease and other diseases of the circulatory system: Secondary | ICD-10-CM

## 2024-02-18 HISTORY — PX: INTRAOPERATIVE TRANSESOPHAGEAL ECHOCARDIOGRAM: SHX5062

## 2024-02-18 HISTORY — PX: CORONARY ARTERY BYPASS GRAFT: SHX141

## 2024-02-18 LAB — POCT I-STAT 7, (LYTES, BLD GAS, ICA,H+H)
Acid-Base Excess: 1 mmol/L (ref 0.0–2.0)
Acid-Base Excess: 2 mmol/L (ref 0.0–2.0)
Acid-Base Excess: 0 mmol/L (ref 0.0–2.0)
Acid-base deficit: 2 mmol/L (ref 0.0–2.0)
Acid-base deficit: 3 mmol/L — ABNORMAL HIGH (ref 0.0–2.0)
Acid-base deficit: 2 mmol/L (ref 0.0–2.0)
Bicarbonate: 25.5 mmol/L (ref 20.0–28.0)
Bicarbonate: 26.4 mmol/L (ref 20.0–28.0)
Bicarbonate: 25.5 mmol/L (ref 20.0–28.0)
Bicarbonate: 24.5 mmol/L (ref 20.0–28.0)
Bicarbonate: 26 mmol/L (ref 20.0–28.0)
Bicarbonate: 27.4 mmol/L (ref 20.0–28.0)
Calcium, Ion: 0.87 mmol/L — CL (ref 1.15–1.40)
Calcium, Ion: 1.16 mmol/L (ref 1.15–1.40)
Calcium, Ion: 1.08 mmol/L — ABNORMAL LOW (ref 1.15–1.40)
Calcium, Ion: 1.09 mmol/L — ABNORMAL LOW (ref 1.15–1.40)
Calcium, Ion: 1.13 mmol/L — ABNORMAL LOW (ref 1.15–1.40)
Calcium, Ion: 1.14 mmol/L — ABNORMAL LOW (ref 1.15–1.40)
HCT: 20 % — ABNORMAL LOW (ref 36.0–46.0)
HCT: 22 % — ABNORMAL LOW (ref 36.0–46.0)
HCT: 47 % — ABNORMAL HIGH (ref 36.0–46.0)
HCT: 43 % (ref 36.0–46.0)
HCT: 43 % (ref 36.0–46.0)
HCT: 44 % (ref 36.0–46.0)
Hemoglobin: 6.8 g/dL — CL (ref 12.0–15.0)
Hemoglobin: 7.5 g/dL — ABNORMAL LOW (ref 12.0–15.0)
Hemoglobin: 16 g/dL — ABNORMAL HIGH (ref 12.0–15.0)
Hemoglobin: 14.6 g/dL (ref 12.0–15.0)
Hemoglobin: 14.6 g/dL (ref 12.0–15.0)
Hemoglobin: 15 g/dL (ref 12.0–15.0)
O2 Saturation: 100 %
O2 Saturation: 100 %
O2 Saturation: 99 %
O2 Saturation: 97 %
O2 Saturation: 97 %
O2 Saturation: 96 %
Patient temperature: 35.4
Patient temperature: 36.4
Patient temperature: 36.6
Patient temperature: 36.8
Potassium: 5.8 mmol/L — ABNORMAL HIGH (ref 3.5–5.1)
Potassium: 5 mmol/L (ref 3.5–5.1)
Potassium: 3.6 mmol/L (ref 3.5–5.1)
Potassium: 4.2 mmol/L (ref 3.5–5.1)
Potassium: 4.5 mmol/L (ref 3.5–5.1)
Potassium: 4.2 mmol/L (ref 3.5–5.1)
Sodium: 129 mmol/L — ABNORMAL LOW (ref 135–145)
Sodium: 128 mmol/L — ABNORMAL LOW (ref 135–145)
Sodium: 133 mmol/L — ABNORMAL LOW (ref 135–145)
Sodium: 138 mmol/L (ref 135–145)
Sodium: 137 mmol/L (ref 135–145)
Sodium: 136 mmol/L (ref 135–145)
TCO2: 27 mmol/L (ref 22–32)
TCO2: 28 mmol/L (ref 22–32)
TCO2: 27 mmol/L (ref 22–32)
TCO2: 26 mmol/L (ref 22–32)
TCO2: 28 mmol/L (ref 22–32)
TCO2: 29 mmol/L (ref 22–32)
pCO2 arterial: 36.8 mmHg (ref 32–48)
pCO2 arterial: 38.4 mmHg (ref 32–48)
pCO2 arterial: 47.6 mmHg (ref 32–48)
pCO2 arterial: 52.5 mmHg — ABNORMAL HIGH (ref 32–48)
pCO2 arterial: 56.4 mmHg — ABNORMAL HIGH (ref 32–48)
pCO2 arterial: 55 mmHg — ABNORMAL HIGH (ref 32–48)
pH, Arterial: 7.449 (ref 7.35–7.45)
pH, Arterial: 7.446 (ref 7.35–7.45)
pH, Arterial: 7.328 — ABNORMAL LOW (ref 7.35–7.45)
pH, Arterial: 7.273 — ABNORMAL LOW (ref 7.35–7.45)
pH, Arterial: 7.271 — ABNORMAL LOW (ref 7.35–7.45)
pH, Arterial: 7.304 — ABNORMAL LOW (ref 7.35–7.45)
pO2, Arterial: 400 mmHg — ABNORMAL HIGH (ref 83–108)
pO2, Arterial: 369 mmHg — ABNORMAL HIGH (ref 83–108)
pO2, Arterial: 171 mmHg — ABNORMAL HIGH (ref 83–108)
pO2, Arterial: 103 mmHg (ref 83–108)
pO2, Arterial: 109 mmHg — ABNORMAL HIGH (ref 83–108)
pO2, Arterial: 92 mmHg (ref 83–108)

## 2024-02-18 LAB — POCT I-STAT EG7
Acid-Base Excess: 2 mmol/L (ref 0.0–2.0)
Bicarbonate: 25.7 mmol/L (ref 20.0–28.0)
Calcium, Ion: 0.95 mmol/L — ABNORMAL LOW (ref 1.15–1.40)
HCT: 23 % — ABNORMAL LOW (ref 36.0–46.0)
Hemoglobin: 7.8 g/dL — ABNORMAL LOW (ref 12.0–15.0)
O2 Saturation: 79 %
Potassium: 4.2 mmol/L (ref 3.5–5.1)
Sodium: 131 mmol/L — ABNORMAL LOW (ref 135–145)
TCO2: 27 mmol/L (ref 22–32)
pCO2, Ven: 35.1 mmHg — ABNORMAL LOW (ref 44–60)
pH, Ven: 7.473 — ABNORMAL HIGH (ref 7.25–7.43)
pO2, Ven: 40 mmHg (ref 32–45)

## 2024-02-18 LAB — POCT I-STAT, CHEM 8
BUN: 12 mg/dL (ref 8–23)
BUN: 12 mg/dL (ref 8–23)
BUN: 11 mg/dL (ref 8–23)
BUN: 12 mg/dL (ref 8–23)
BUN: 9 mg/dL (ref 8–23)
BUN: 9 mg/dL (ref 8–23)
Calcium, Ion: 1.19 mmol/L (ref 1.15–1.40)
Calcium, Ion: 1.17 mmol/L (ref 1.15–1.40)
Calcium, Ion: 1.23 mmol/L (ref 1.15–1.40)
Calcium, Ion: 1.25 mmol/L (ref 1.15–1.40)
Calcium, Ion: 0.83 mmol/L — CL (ref 1.15–1.40)
Calcium, Ion: 1.17 mmol/L (ref 1.15–1.40)
Chloride: 95 mmol/L — ABNORMAL LOW (ref 98–111)
Chloride: 90 mmol/L — ABNORMAL LOW (ref 98–111)
Chloride: 93 mmol/L — ABNORMAL LOW (ref 98–111)
Chloride: 94 mmol/L — ABNORMAL LOW (ref 98–111)
Chloride: 87 mmol/L — ABNORMAL LOW (ref 98–111)
Chloride: 91 mmol/L — ABNORMAL LOW (ref 98–111)
Creatinine, Ser: 0.7 mg/dL (ref 0.44–1.00)
Creatinine, Ser: 0.5 mg/dL (ref 0.44–1.00)
Creatinine, Ser: 0.6 mg/dL (ref 0.44–1.00)
Creatinine, Ser: 0.7 mg/dL (ref 0.44–1.00)
Creatinine, Ser: 0.4 mg/dL — ABNORMAL LOW (ref 0.44–1.00)
Creatinine, Ser: 0.6 mg/dL (ref 0.44–1.00)
Glucose, Bld: 118 mg/dL — ABNORMAL HIGH (ref 70–99)
Glucose, Bld: 223 mg/dL — ABNORMAL HIGH (ref 70–99)
Glucose, Bld: 147 mg/dL — ABNORMAL HIGH (ref 70–99)
Glucose, Bld: 200 mg/dL — ABNORMAL HIGH (ref 70–99)
Glucose, Bld: 99 mg/dL (ref 70–99)
Glucose, Bld: 125 mg/dL — ABNORMAL HIGH (ref 70–99)
HCT: 33 % — ABNORMAL LOW (ref 36.0–46.0)
HCT: 38 % (ref 36.0–46.0)
HCT: 36 % (ref 36.0–46.0)
HCT: 39 % (ref 36.0–46.0)
HCT: 18 % — ABNORMAL LOW (ref 36.0–46.0)
HCT: 24 % — ABNORMAL LOW (ref 36.0–46.0)
Hemoglobin: 11.2 g/dL — ABNORMAL LOW (ref 12.0–15.0)
Hemoglobin: 12.9 g/dL (ref 12.0–15.0)
Hemoglobin: 12.2 g/dL (ref 12.0–15.0)
Hemoglobin: 13.3 g/dL (ref 12.0–15.0)
Hemoglobin: 6.1 g/dL — CL (ref 12.0–15.0)
Hemoglobin: 8.2 g/dL — ABNORMAL LOW (ref 12.0–15.0)
Potassium: 3.6 mmol/L (ref 3.5–5.1)
Potassium: 3.5 mmol/L (ref 3.5–5.1)
Potassium: 3.3 mmol/L — ABNORMAL LOW (ref 3.5–5.1)
Potassium: 3.6 mmol/L (ref 3.5–5.1)
Potassium: 4.4 mmol/L (ref 3.5–5.1)
Potassium: 5 mmol/L (ref 3.5–5.1)
Sodium: 133 mmol/L — ABNORMAL LOW (ref 135–145)
Sodium: 133 mmol/L — ABNORMAL LOW (ref 135–145)
Sodium: 131 mmol/L — ABNORMAL LOW (ref 135–145)
Sodium: 131 mmol/L — ABNORMAL LOW (ref 135–145)
Sodium: 127 mmol/L — ABNORMAL LOW (ref 135–145)
Sodium: 129 mmol/L — ABNORMAL LOW (ref 135–145)
TCO2: 25 mmol/L (ref 22–32)
TCO2: 29 mmol/L (ref 22–32)
TCO2: 27 mmol/L (ref 22–32)
TCO2: 28 mmol/L (ref 22–32)
TCO2: 23 mmol/L (ref 22–32)
TCO2: 26 mmol/L (ref 22–32)

## 2024-02-18 LAB — PROTIME-INR
INR: 2.4 — ABNORMAL HIGH (ref 0.8–1.2)
Prothrombin Time: 26.9 s — ABNORMAL HIGH (ref 11.4–15.2)

## 2024-02-18 LAB — GLUCOSE, CAPILLARY
Glucose-Capillary: 31 mg/dL — CL (ref 70–99)
Glucose-Capillary: 35 mg/dL — CL (ref 70–99)
Glucose-Capillary: 239 mg/dL — ABNORMAL HIGH (ref 70–99)
Glucose-Capillary: 376 mg/dL — ABNORMAL HIGH (ref 70–99)
Glucose-Capillary: 145 mg/dL — ABNORMAL HIGH (ref 70–99)
Glucose-Capillary: 151 mg/dL — ABNORMAL HIGH (ref 70–99)
Glucose-Capillary: 130 mg/dL — ABNORMAL HIGH (ref 70–99)
Glucose-Capillary: 126 mg/dL — ABNORMAL HIGH (ref 70–99)
Glucose-Capillary: 141 mg/dL — ABNORMAL HIGH (ref 70–99)
Glucose-Capillary: 105 mg/dL — ABNORMAL HIGH (ref 70–99)
Glucose-Capillary: 154 mg/dL — ABNORMAL HIGH (ref 70–99)

## 2024-02-18 LAB — CBC
HCT: 18.8 % — ABNORMAL LOW (ref 36.0–46.0)
HCT: 42.4 % (ref 36.0–46.0)
Hemoglobin: 6.3 g/dL — CL (ref 12.0–15.0)
Hemoglobin: 14.5 g/dL (ref 12.0–15.0)
MCH: 31.7 pg (ref 26.0–34.0)
MCH: 30.8 pg (ref 26.0–34.0)
MCHC: 33.5 g/dL (ref 30.0–36.0)
MCHC: 34.2 g/dL (ref 30.0–36.0)
MCV: 94.5 fL (ref 80.0–100.0)
MCV: 90 fL (ref 80.0–100.0)
Platelets: 43 K/uL — ABNORMAL LOW (ref 150–400)
Platelets: 158 K/uL (ref 150–400)
RBC: 1.99 MIL/uL — ABNORMAL LOW (ref 3.87–5.11)
RBC: 4.71 MIL/uL (ref 3.87–5.11)
RDW: 13.6 % (ref 11.5–15.5)
RDW: 13.9 % (ref 11.5–15.5)
WBC: 5.3 K/uL (ref 4.0–10.5)
WBC: 12.8 K/uL — ABNORMAL HIGH (ref 4.0–10.5)
nRBC: 0 % (ref 0.0–0.2)
nRBC: 0 % (ref 0.0–0.2)

## 2024-02-18 LAB — BASIC METABOLIC PANEL WITH GFR
Anion gap: 9 (ref 5–15)
BUN: 8 mg/dL (ref 8–23)
CO2: 27 mmol/L (ref 22–32)
Calcium: 7.7 mg/dL — ABNORMAL LOW (ref 8.9–10.3)
Chloride: 101 mmol/L (ref 98–111)
Creatinine, Ser: 0.67 mg/dL (ref 0.44–1.00)
GFR, Estimated: >60 mL/min
Glucose, Bld: 134 mg/dL — ABNORMAL HIGH (ref 70–99)
Potassium: 4.6 mmol/L (ref 3.5–5.1)
Sodium: 136 mmol/L (ref 135–145)

## 2024-02-18 LAB — APTT
aPTT: 53 s — ABNORMAL HIGH (ref 24–36)
aPTT: 39 s — ABNORMAL HIGH (ref 24–36)

## 2024-02-18 LAB — ECHO INTRAOPERATIVE TEE
Height: 60 in
Weight: 1872 [oz_av]

## 2024-02-18 LAB — PLATELET COUNT: Platelets: 108 K/uL — ABNORMAL LOW (ref 150–400)

## 2024-02-18 LAB — HEMOGLOBIN AND HEMATOCRIT, BLOOD
HCT: 18 % — ABNORMAL LOW (ref 36.0–46.0)
Hemoglobin: 6.2 g/dL — CL (ref 12.0–15.0)

## 2024-02-18 LAB — PREPARE RBC (CROSSMATCH)

## 2024-02-18 SURGERY — CORONARY ARTERY BYPASS GRAFTING (CABG)
Anesthesia: General | Site: Chest

## 2024-02-18 MED ORDER — SODIUM CHLORIDE 0.45 % IV SOLN: INTRAVENOUS | Status: AC | PRN

## 2024-02-18 MED ORDER — EPHEDRINE 5 MG/ML INJ
INTRAVENOUS | Status: AC
  Filled 2024-02-18: qty 5

## 2024-02-18 MED ORDER — ACETAMINOPHEN 160 MG/5ML PO SOLN
650.0000 mg | Freq: Once | ORAL | Status: AC
  Administered 2024-02-18: 650 mg
  Filled 2024-02-18: qty 20.3

## 2024-02-18 MED ORDER — THROMBIN (RECOMBINANT) 20000 UNITS EX SOLR
CUTANEOUS | Status: AC
  Filled 2024-02-18: qty 20000

## 2024-02-18 MED ORDER — ALPRAZOLAM 0.25 MG PO TABS: 0.2500 mg | ORAL_TABLET | Freq: Every day | ORAL | Status: DC | PRN

## 2024-02-18 MED ORDER — ROSUVASTATIN CALCIUM 5 MG PO TABS
10.0000 mg | ORAL_TABLET | Freq: Every day | ORAL | Status: DC
  Administered 2024-02-19 – 2024-02-23 (×5): 10 mg via ORAL
  Filled 2024-02-18 (×5): qty 2

## 2024-02-18 MED ORDER — SODIUM CHLORIDE 0.9 % IV SOLN: 10.0000 mL/h | Freq: Once | INTRAVENOUS | Status: DC

## 2024-02-18 MED ORDER — ONDANSETRON HCL 4 MG/2ML IJ SOLN
INTRAMUSCULAR | Status: DC | PRN
  Administered 2024-02-18: 4 mg via INTRAVENOUS

## 2024-02-18 MED ORDER — CHLORHEXIDINE GLUCONATE 0.12 % MT SOLN
OROMUCOSAL | Status: AC
  Filled 2024-02-18: qty 15

## 2024-02-18 MED ORDER — SODIUM CHLORIDE 0.9% FLUSH
3.0000 mL | Freq: Two times a day (BID) | INTRAVENOUS | Status: AC
  Administered 2024-02-19 – 2024-02-23 (×8): 3 mL via INTRAVENOUS

## 2024-02-18 MED ORDER — METOPROLOL TARTRATE 25 MG/10 ML ORAL SUSPENSION: 12.5000 mg | Freq: Two times a day (BID) | ORAL | Status: DC

## 2024-02-18 MED ORDER — PANTOPRAZOLE SODIUM 40 MG PO TBEC
40.0000 mg | DELAYED_RELEASE_TABLET | Freq: Every day | ORAL | Status: DC
  Administered 2024-02-20 – 2024-02-23 (×4): 40 mg via ORAL
  Filled 2024-02-18 (×4): qty 1

## 2024-02-18 MED ORDER — PROPOFOL 10 MG/ML IV BOLUS
INTRAVENOUS | Status: AC
  Filled 2024-02-18: qty 20

## 2024-02-18 MED ORDER — CHLORHEXIDINE GLUCONATE 0.12 % MT SOLN
15.0000 mL | Freq: Once | OROMUCOSAL | Status: AC
  Administered 2024-02-18: 15 mL via OROMUCOSAL

## 2024-02-18 MED ORDER — FENTANYL CITRATE (PF) 250 MCG/5ML IJ SOLN
INTRAMUSCULAR | Status: AC
  Filled 2024-02-18: qty 5

## 2024-02-18 MED ORDER — NITROGLYCERIN IN D5W 200-5 MCG/ML-% IV SOLN: 0.0000 ug/min | INTRAVENOUS | Status: DC

## 2024-02-18 MED ORDER — LACTATED RINGERS IV SOLN: INTRAVENOUS | Status: AC

## 2024-02-18 MED ORDER — CLEVIDIPINE BUTYRATE 0.5 MG/ML IV EMUL
INTRAVENOUS | Status: AC
  Filled 2024-02-18: qty 50

## 2024-02-18 MED ORDER — HEMOSTATIC AGENTS (NO CHARGE) OPTIME
TOPICAL | Status: DC | PRN
  Administered 2024-02-18: 3 via TOPICAL
  Administered 2024-02-18: 1 via TOPICAL

## 2024-02-18 MED ORDER — FENTANYL CITRATE (PF) 250 MCG/5ML IJ SOLN
INTRAMUSCULAR | Status: DC | PRN
  Administered 2024-02-18: 100 ug via INTRAVENOUS
  Administered 2024-02-18: 250 ug via INTRAVENOUS
  Administered 2024-02-18 (×4): 100 ug via INTRAVENOUS
  Administered 2024-02-18: 150 ug via INTRAVENOUS
  Administered 2024-02-18 (×2): 100 ug via INTRAVENOUS

## 2024-02-18 MED ORDER — SODIUM CHLORIDE 0.9 % IV SOLN: INTRAVENOUS | Status: DC | PRN

## 2024-02-18 MED ORDER — ACETAMINOPHEN 500 MG PO TABS
1000.0000 mg | ORAL_TABLET | Freq: Four times a day (QID) | ORAL | Status: DC
  Administered 2024-02-18 – 2024-02-23 (×16): 1000 mg via ORAL
  Filled 2024-02-18 (×17): qty 2

## 2024-02-18 MED ORDER — CHLORHEXIDINE GLUCONATE CLOTH 2 % EX PADS
6.0000 | MEDICATED_PAD | Freq: Every day | CUTANEOUS | Status: DC
  Administered 2024-02-18 – 2024-02-23 (×5): 6 via TOPICAL

## 2024-02-18 MED ORDER — MIDAZOLAM HCL (PF) 10 MG/2ML IJ SOLN
INTRAMUSCULAR | Status: AC
  Filled 2024-02-18: qty 2

## 2024-02-18 MED ORDER — DOCUSATE SODIUM 100 MG PO CAPS
200.0000 mg | ORAL_CAPSULE | Freq: Every day | ORAL | Status: DC
  Administered 2024-02-19 – 2024-02-21 (×3): 200 mg via ORAL
  Filled 2024-02-18 (×3): qty 2

## 2024-02-18 MED ORDER — REVEFENACIN 175 MCG/3ML IN SOLN
175.0000 ug | Freq: Every day | RESPIRATORY_TRACT | Status: DC
  Administered 2024-02-19 – 2024-02-21 (×3): 175 ug via RESPIRATORY_TRACT
  Filled 2024-02-18 (×3): qty 3

## 2024-02-18 MED ORDER — BISACODYL 5 MG PO TBEC
10.0000 mg | DELAYED_RELEASE_TABLET | Freq: Every day | ORAL | Status: AC
  Administered 2024-02-19 – 2024-02-21 (×3): 10 mg via ORAL
  Filled 2024-02-18 (×5): qty 2

## 2024-02-18 MED ORDER — PROTAMINE SULFATE 10 MG/ML IV SOLN
INTRAVENOUS | Status: DC | PRN
  Administered 2024-02-18: 170 mg via INTRAVENOUS
  Administered 2024-02-18: 50 mg via INTRAVENOUS

## 2024-02-18 MED ORDER — INSULIN REGULAR(HUMAN) IN NACL 100-0.9 UT/100ML-% IV SOLN: INTRAVENOUS | Status: DC

## 2024-02-18 MED ORDER — ASPIRIN 81 MG PO CHEW: 324.0000 mg | CHEWABLE_TABLET | Freq: Every day | ORAL | Status: DC

## 2024-02-18 MED ORDER — METOPROLOL TARTRATE 12.5 MG HALF TABLET: 12.5000 mg | ORAL_TABLET | Freq: Two times a day (BID) | ORAL | Status: DC

## 2024-02-18 MED ORDER — SODIUM CHLORIDE 0.9% FLUSH: 3.0000 mL | INTRAVENOUS | Status: AC | PRN

## 2024-02-18 MED ORDER — MIDAZOLAM HCL (PF) 2 MG/2ML IJ SOLN
2.0000 mg | INTRAMUSCULAR | Status: DC | PRN
  Filled 2024-02-18: qty 2

## 2024-02-18 MED ORDER — ACETAMINOPHEN 160 MG/5ML PO SOLN: 1000.0000 mg | Freq: Four times a day (QID) | ORAL | Status: DC

## 2024-02-18 MED ORDER — MAGNESIUM SULFATE 4 GM/100ML IV SOLN
4.0000 g | Freq: Once | INTRAVENOUS | Status: AC
  Administered 2024-02-18: 4 g via INTRAVENOUS
  Filled 2024-02-18: qty 100

## 2024-02-18 MED ORDER — DEXTROSE 50 % IV SOLN: 1.0000 | Freq: Once | INTRAVENOUS | Status: AC

## 2024-02-18 MED ORDER — METOPROLOL TARTRATE 5 MG/5ML IV SOLN: 2.5000 mg | INTRAVENOUS | Status: DC | PRN

## 2024-02-18 MED ORDER — MIDAZOLAM HCL (PF) 5 MG/ML IJ SOLN
INTRAMUSCULAR | Status: DC | PRN
  Administered 2024-02-18: 1 mg via INTRAVENOUS
  Administered 2024-02-18 (×2): 2 mg via INTRAVENOUS
  Administered 2024-02-18: 1 mg via INTRAVENOUS
  Administered 2024-02-18 (×2): 2 mg via INTRAVENOUS

## 2024-02-18 MED ORDER — CLEVIDIPINE BUTYRATE 0.5 MG/ML IV EMUL
INTRAVENOUS | Status: DC | PRN
  Administered 2024-02-18: 2 mg/h via INTRAVENOUS

## 2024-02-18 MED ORDER — OXYCODONE HCL 5 MG PO TABS
5.0000 mg | ORAL_TABLET | ORAL | Status: DC | PRN
  Administered 2024-02-18: 5 mg via ORAL
  Administered 2024-02-18: 10 mg via ORAL
  Administered 2024-02-19: 5 mg via ORAL
  Administered 2024-02-19: 10 mg via ORAL
  Administered 2024-02-20 – 2024-02-23 (×4): 5 mg via ORAL
  Filled 2024-02-18 (×2): qty 1
  Filled 2024-02-18: qty 2
  Filled 2024-02-18: qty 1
  Filled 2024-02-18 (×2): qty 2
  Filled 2024-02-18 (×2): qty 1

## 2024-02-18 MED ORDER — HEPARIN SODIUM (PORCINE) 1000 UNIT/ML IJ SOLN
INTRAMUSCULAR | Status: DC | PRN
  Administered 2024-02-18: 17000 [IU] via INTRAVENOUS

## 2024-02-18 MED ORDER — ALBUMIN HUMAN 5 % IV SOLN
250.0000 mL | INTRAVENOUS | Status: DC | PRN
  Administered 2024-02-18 (×2): 12.5 g via INTRAVENOUS
  Filled 2024-02-18: qty 250

## 2024-02-18 MED ORDER — BISACODYL 10 MG RE SUPP: 10.0000 mg | Freq: Every day | RECTAL | Status: DC

## 2024-02-18 MED ORDER — SODIUM CHLORIDE 0.9 % IV SOLN: 250.0000 mL | INTRAVENOUS | Status: AC

## 2024-02-18 MED ORDER — ONDANSETRON HCL 4 MG/2ML IJ SOLN: 4.0000 mg | Freq: Four times a day (QID) | INTRAMUSCULAR | Status: DC | PRN

## 2024-02-18 MED ORDER — ASPIRIN 325 MG PO TBEC
325.0000 mg | DELAYED_RELEASE_TABLET | Freq: Every day | ORAL | Status: DC
  Administered 2024-02-19 – 2024-02-21 (×3): 325 mg via ORAL
  Filled 2024-02-18 (×3): qty 1

## 2024-02-18 MED ORDER — 0.9 % SODIUM CHLORIDE (POUR BTL) OPTIME
TOPICAL | Status: DC | PRN
  Administered 2024-02-18: 5000 mL

## 2024-02-18 MED ORDER — TRAMADOL HCL 50 MG PO TABS
50.0000 mg | ORAL_TABLET | ORAL | Status: DC | PRN
  Administered 2024-02-19 – 2024-02-21 (×6): 50 mg via ORAL
  Filled 2024-02-18 (×6): qty 1

## 2024-02-18 MED ORDER — PLASMA-LYTE A IV SOLN: INTRAVENOUS | Status: DC | PRN

## 2024-02-18 MED ORDER — MORPHINE SULFATE (PF) 2 MG/ML IV SOLN
1.0000 mg | INTRAVENOUS | Status: DC | PRN
  Administered 2024-02-18: 4 mg via INTRAVENOUS
  Administered 2024-02-18 – 2024-02-19 (×2): 2 mg via INTRAVENOUS
  Filled 2024-02-18: qty 1
  Filled 2024-02-18: qty 2
  Filled 2024-02-18: qty 1

## 2024-02-18 MED ORDER — ALBUTEROL SULFATE HFA 108 (90 BASE) MCG/ACT IN AERS
INHALATION_SPRAY | RESPIRATORY_TRACT | Status: AC
  Filled 2024-02-18: qty 6.7

## 2024-02-18 MED ORDER — ROCURONIUM BROMIDE 10 MG/ML (PF) SYRINGE
PREFILLED_SYRINGE | INTRAVENOUS | Status: AC
  Filled 2024-02-18: qty 10

## 2024-02-18 MED ORDER — ALPRAZOLAM 0.5 MG PO TABS
0.5000 mg | ORAL_TABLET | Freq: Every evening | ORAL | Status: DC | PRN
  Administered 2024-02-18 – 2024-02-22 (×5): 0.5 mg via ORAL
  Filled 2024-02-18 (×5): qty 1

## 2024-02-18 MED ORDER — DEXMEDETOMIDINE HCL IN NACL 400 MCG/100ML IV SOLN: 0.0000 ug/kg/h | INTRAVENOUS | Status: DC

## 2024-02-18 MED ORDER — ESCITALOPRAM OXALATE 10 MG PO TABS
10.0000 mg | ORAL_TABLET | Freq: Every day | ORAL | Status: AC
  Administered 2024-02-19 – 2024-02-23 (×5): 10 mg via ORAL
  Filled 2024-02-18 (×5): qty 1

## 2024-02-18 MED ORDER — VANCOMYCIN HCL IN DEXTROSE 1-5 GM/200ML-% IV SOLN
1000.0000 mg | Freq: Once | INTRAVENOUS | Status: AC
  Administered 2024-02-18: 1000 mg via INTRAVENOUS
  Filled 2024-02-18: qty 200

## 2024-02-18 MED ORDER — PHENYLEPHRINE 80 MCG/ML (10ML) SYRINGE FOR IV PUSH (FOR BLOOD PRESSURE SUPPORT)
PREFILLED_SYRINGE | INTRAVENOUS | Status: DC | PRN
  Administered 2024-02-18: 80 ug via INTRAVENOUS

## 2024-02-18 MED ORDER — FENTANYL CITRATE (PF) 100 MCG/2ML IJ SOLN
INTRAMUSCULAR | Status: AC
  Filled 2024-02-18: qty 2

## 2024-02-18 MED ORDER — METOCLOPRAMIDE HCL 5 MG/ML IJ SOLN
10.0000 mg | Freq: Four times a day (QID) | INTRAMUSCULAR | Status: AC
  Administered 2024-02-18 – 2024-02-19 (×6): 10 mg via INTRAVENOUS
  Filled 2024-02-18 (×6): qty 2

## 2024-02-18 MED ORDER — THROMBIN 20000 UNITS EX SOLR
CUTANEOUS | Status: DC | PRN
  Administered 2024-02-18: 20000 [IU] via TOPICAL

## 2024-02-18 MED ORDER — ~~LOC~~ CARDIAC SURGERY, PATIENT & FAMILY EDUCATION
Freq: Once | Status: DC
  Filled 2024-02-18: qty 1

## 2024-02-18 MED ORDER — LACTATED RINGERS IV SOLN: INTRAVENOUS | Status: DC | PRN

## 2024-02-18 MED ORDER — ONDANSETRON HCL 4 MG/2ML IJ SOLN
INTRAMUSCULAR | Status: AC
  Filled 2024-02-18: qty 2

## 2024-02-18 MED ORDER — CLEVIDIPINE BUTYRATE 0.5 MG/ML IV EMUL
0.0000 mg/h | INTRAVENOUS | Status: DC
  Administered 2024-02-18 (×2): 2 mg/h via INTRAVENOUS
  Administered 2024-02-19: 16 mg/h via INTRAVENOUS
  Filled 2024-02-18 (×2): qty 100

## 2024-02-18 MED ORDER — ALPRAZOLAM 0.25 MG PO TABS: 0.2500 mg | ORAL_TABLET | ORAL | Status: DC

## 2024-02-18 MED ORDER — METOPROLOL TARTRATE 12.5 MG HALF TABLET: 12.5000 mg | ORAL_TABLET | Freq: Once | ORAL | Status: DC

## 2024-02-18 MED ORDER — ORAL CARE MOUTH RINSE
15.0000 mL | OROMUCOSAL | Status: DC
  Administered 2024-02-18 – 2024-02-23 (×13): 15 mL via OROMUCOSAL

## 2024-02-18 MED ORDER — ROCURONIUM BROMIDE 10 MG/ML (PF) SYRINGE
PREFILLED_SYRINGE | INTRAVENOUS | Status: DC | PRN
  Administered 2024-02-18: 50 mg via INTRAVENOUS
  Administered 2024-02-18: 100 mg via INTRAVENOUS

## 2024-02-18 MED ORDER — PROPOFOL 10 MG/ML IV BOLUS
INTRAVENOUS | Status: DC | PRN
  Administered 2024-02-18: 30 mg via INTRAVENOUS
  Administered 2024-02-18: 40 mg via INTRAVENOUS
  Administered 2024-02-18: 30 mg via INTRAVENOUS

## 2024-02-18 MED ORDER — ARFORMOTEROL TARTRATE 15 MCG/2ML IN NEBU
15.0000 ug | INHALATION_SOLUTION | Freq: Two times a day (BID) | RESPIRATORY_TRACT | Status: DC
  Administered 2024-02-18 – 2024-02-21 (×6): 15 ug via RESPIRATORY_TRACT
  Filled 2024-02-18 (×6): qty 2

## 2024-02-18 MED ORDER — ASPIRIN 81 MG PO CHEW
324.0000 mg | CHEWABLE_TABLET | Freq: Once | ORAL | Status: AC
  Administered 2024-02-18: 324 mg via ORAL
  Filled 2024-02-18: qty 4

## 2024-02-18 MED ORDER — ORAL CARE MOUTH RINSE: 15.0000 mL | OROMUCOSAL | Status: DC | PRN

## 2024-02-18 MED ORDER — PHENYLEPHRINE 80 MCG/ML (10ML) SYRINGE FOR IV PUSH (FOR BLOOD PRESSURE SUPPORT)
PREFILLED_SYRINGE | INTRAVENOUS | Status: AC
  Filled 2024-02-18: qty 10

## 2024-02-18 MED ORDER — PHENYLEPHRINE HCL-NACL 20-0.9 MG/250ML-% IV SOLN: 0.0000 ug/min | INTRAVENOUS | Status: DC

## 2024-02-18 MED ORDER — DEXTROSE 50 % IV SOLN
0.0000 mL | INTRAVENOUS | Status: DC | PRN
  Administered 2024-02-18: 50 mL via INTRAVENOUS
  Filled 2024-02-18: qty 50

## 2024-02-18 MED ORDER — CHLORHEXIDINE GLUCONATE 4 % EX SOLN: 30.0000 mL | CUTANEOUS | Status: DC

## 2024-02-18 MED ORDER — POTASSIUM CHLORIDE 10 MEQ/50ML IV SOLN
10.0000 meq | INTRAVENOUS | Status: AC
  Administered 2024-02-18 (×3): 10 meq via INTRAVENOUS

## 2024-02-18 MED ORDER — PANTOPRAZOLE SODIUM 40 MG IV SOLR
40.0000 mg | Freq: Every day | INTRAVENOUS | Status: AC
  Administered 2024-02-18 – 2024-02-19 (×2): 40 mg via INTRAVENOUS
  Filled 2024-02-18 (×2): qty 10

## 2024-02-18 MED ORDER — CEFAZOLIN SODIUM-DEXTROSE 2-4 GM/100ML-% IV SOLN
2.0000 g | Freq: Three times a day (TID) | INTRAVENOUS | Status: AC
  Administered 2024-02-18 – 2024-02-20 (×6): 2 g via INTRAVENOUS
  Filled 2024-02-18 (×6): qty 100

## 2024-02-18 MED ORDER — SODIUM CHLORIDE 0.9 % IV SOLN: INTRAVENOUS | Status: AC

## 2024-02-18 MED ORDER — CHLORHEXIDINE GLUCONATE 0.12 % MT SOLN
15.0000 mL | OROMUCOSAL | Status: AC
  Administered 2024-02-18: 15 mL via OROMUCOSAL
  Filled 2024-02-18: qty 15

## 2024-02-18 SURGICAL SUPPLY — 82 items
BAG DECANTER FOR FLEXI CONT (MISCELLANEOUS) ×2 IMPLANT
BLADE CLIPPER SURG (BLADE) ×2 IMPLANT
BLADE STERNUM SYSTEM 6 (BLADE) ×2 IMPLANT
BNDG ELASTIC 4INX 5YD STR LF (GAUZE/BANDAGES/DRESSINGS) IMPLANT
BNDG ELASTIC 4X5.8 VLCR STR LF (GAUZE/BANDAGES/DRESSINGS) ×2 IMPLANT
BNDG ELASTIC 6INX 5YD STR LF (GAUZE/BANDAGES/DRESSINGS) ×2 IMPLANT
BNDG GAUZE DERMACEA FLUFF 4 (GAUZE/BANDAGES/DRESSINGS) ×2 IMPLANT
CANISTER SUCTION 3000ML PPV (SUCTIONS) ×2 IMPLANT
CANNULA AORTIC ROOT 9FR (CANNULA) ×2 IMPLANT
CANNULA ARTERIAL VENT 3/8 20FR (CANNULA) IMPLANT
CANNULA MC2 2 STG 36/46 NON-V (CANNULA) IMPLANT
CATH ROBINSON RED A/P 18FR (CATHETERS) ×4 IMPLANT
CATH THORACIC 28FR (CATHETERS) ×2 IMPLANT
CATH THORACIC 36FR (CATHETERS) ×2 IMPLANT
CATH THORACIC 36FR RT ANG (CATHETERS) ×2 IMPLANT
CONTAINER PROTECT SURGISLUSH (MISCELLANEOUS) ×4 IMPLANT
COVER PROBE W GEL 5X96 (DRAPES) IMPLANT
DERMABOND ADVANCED .7 DNX12 (GAUZE/BANDAGES/DRESSINGS) IMPLANT
DRAPE SRG 135X102X78XABS (DRAPES) ×2 IMPLANT
DRAPE WARM FLUID 44X44 (DRAPES) ×2 IMPLANT
DRSG AQUACEL AG ADV 3.5X14 (GAUZE/BANDAGES/DRESSINGS) IMPLANT
DRSG COVADERM 4X14 (GAUZE/BANDAGES/DRESSINGS) ×2 IMPLANT
ELECT CAUTERY BLADE 6.4 (BLADE) ×2 IMPLANT
ELECTRODE BLDE 4.0 EZ CLN MEGD (MISCELLANEOUS) ×2 IMPLANT
ELECTRODE REM PT RTRN 9FT ADLT (ELECTROSURGICAL) ×4 IMPLANT
FELT TEFLON 1X6 (MISCELLANEOUS) ×2 IMPLANT
GAUZE SPONGE 4X4 12PLY STRL (GAUZE/BANDAGES/DRESSINGS) ×4 IMPLANT
GLOVE BIO SURGEON STRL SZ 6 (GLOVE) IMPLANT
GLOVE BIO SURGEON STRL SZ 6.5 (GLOVE) IMPLANT
GLOVE BIOGEL PI IND STRL 6.5 (GLOVE) IMPLANT
GLOVE BIOGEL PI IND STRL 7.0 (GLOVE) IMPLANT
GLOVE ECLIPSE 7.0 STRL STRAW (GLOVE) ×4 IMPLANT
GLOVE PI ORTHO PRO STRL 7.5 (GLOVE) IMPLANT
GOWN STRL REUS W/ TWL LRG LVL3 (GOWN DISPOSABLE) ×8 IMPLANT
GOWN STRL REUS W/ TWL XL LVL3 (GOWN DISPOSABLE) ×2 IMPLANT
HEMOSTAT POWDER SURGIFOAM 1G (HEMOSTASIS) ×6 IMPLANT
HEMOSTAT SURGICEL 2X14 (HEMOSTASIS) ×2 IMPLANT
KIT BASIN OR (CUSTOM PROCEDURE TRAY) ×2 IMPLANT
KIT SUCTION CATH 14FR (SUCTIONS) ×2 IMPLANT
KIT TURNOVER KIT B (KITS) ×2 IMPLANT
KIT VASOVIEW HEMOPRO 2 VH 4000 (KITS) ×2 IMPLANT
KNIFE MICRO-UNI 3.5 30 DEG (BLADE) ×2 IMPLANT
PACK E OPEN HEART (SUTURE) ×2 IMPLANT
PACK OPEN HEART (CUSTOM PROCEDURE TRAY) ×2 IMPLANT
PAD ARMBOARD POSITIONER FOAM (MISCELLANEOUS) ×4 IMPLANT
PAD ELECT DEFIB RADIOL ZOLL (MISCELLANEOUS) ×2 IMPLANT
PENCIL BUTTON HOLSTER BLD 10FT (ELECTRODE) ×2 IMPLANT
POSITIONER HEAD DONUT 9IN (MISCELLANEOUS) ×2 IMPLANT
PUNCH AORTIC ROTATE 4.0MM (MISCELLANEOUS) IMPLANT
PUNCH AORTIC ROTATE 4.5MM 8IN (MISCELLANEOUS) ×2 IMPLANT
SET MICROPUNCTURE 5F STIFF (MISCELLANEOUS) IMPLANT
SET MPS 3-ND DEL (MISCELLANEOUS) IMPLANT
SOLN 0.9% NACL POUR BTL 1000ML (IV SOLUTION) ×10 IMPLANT
SOLN STERILE WATER BTL 1000 ML (IV SOLUTION) ×4 IMPLANT
SPONGE T-LAP 18X18 ~~LOC~~+RFID (SPONGE) IMPLANT
SUPPORT HEART JANKE-BARRON (MISCELLANEOUS) ×2 IMPLANT
SUT BONE WAX W31G (SUTURE) ×2 IMPLANT
SUT MNCRL AB 4-0 PS2 18 (SUTURE) IMPLANT
SUT PROLENE 3 0 SH1 36 (SUTURE) ×2 IMPLANT
SUT PROLENE 4 0 SH DA (SUTURE) IMPLANT
SUT PROLENE 4-0 RB1 .5 CRCL 36 (SUTURE) IMPLANT
SUT PROLENE 5 0 C 1 36 (SUTURE) IMPLANT
SUT PROLENE 6 0 C 1 30 (SUTURE) IMPLANT
SUT PROLENE 7 0 BV 1 (SUTURE) IMPLANT
SUT PROLENE 7 0 BV1 MDA (SUTURE) ×2 IMPLANT
SUT PROLENE 8 0 BV175 6 (SUTURE) IMPLANT
SUT SILK 1 MH (SUTURE) IMPLANT
SUT STEEL STERNAL CCS#1 18IN (SUTURE) IMPLANT
SUT STEEL SZ 6 DBL 3X14 BALL (SUTURE) IMPLANT
SUT VIC AB 1 CTX36XBRD ANBCTR (SUTURE) ×4 IMPLANT
SUT VIC AB 2-0 CT1 TAPERPNT 27 (SUTURE) IMPLANT
SYSTEM SAHARA CHEST DRAIN ATS (WOUND CARE) ×2 IMPLANT
TAPE CLOTH SURG 4X10 WHT LF (GAUZE/BANDAGES/DRESSINGS) IMPLANT
TOWEL GREEN STERILE (TOWEL DISPOSABLE) ×2 IMPLANT
TOWEL GREEN STERILE FF (TOWEL DISPOSABLE) ×2 IMPLANT
TRAY CATH LUMEN 1 20CM STRL (SET/KITS/TRAYS/PACK) IMPLANT
TRAY FOLEY SLVR 16FR TEMP STAT (SET/KITS/TRAYS/PACK) ×2 IMPLANT
TUBE SUCT INTRACARD DLP 20F (MISCELLANEOUS) ×2 IMPLANT
TUBE SUCTION CARDIAC 10FR (CANNULA) ×2 IMPLANT
TUBING ART PRESS 48 MALE/FEM (TUBING) IMPLANT
TUBING LAP HI FLOW INSUFFLATIO (TUBING) ×2 IMPLANT
UNDERPAD 30X36 HEAVY ABSORB (UNDERPADS AND DIAPERS) ×2 IMPLANT

## 2024-02-18 NOTE — Progress Notes (Signed)
" ° °  [address] [place] [address]             [phone]   S/p CABG x 2  Intubated, wean in progress  BP 115/62   Pulse 80   Temp (!) 95.9 F (35.5 C)   Resp 19   Ht 5' (1.524 m)   Wt 53.1 kg   SpO2 99%   BMI 22.85 kg/m   29/18 CI 2.16  Hct 50, PLT 127K   Intake/Output Summary (Last 24 hours) at 02/18/2024 1732 Last data filed at 02/18/2024 1600 Gross per 24 hour  Intake 3841.09 ml  Output 4571 ml  Net -729.91 ml   Minimal CT output  pCo2 a little high at 53 but awake and follows commands, good strength, will attempt to extubate  Elspeth C. Kerrin, MD [facility] [phone]  "

## 2024-02-18 NOTE — Interval H&P Note (Signed)
 History and Physical Interval Note:  02/18/2024 6:39 AM  [name]  has presented today for surgery, with the diagnosis of CAD.  The various methods of treatment have been discussed with the patient and family. After consideration of risks, benefits and other options for treatment, the patient has consented to  Procedures: CORONARY ARTERY BYPASS GRAFTING (CABG) (N/A) ECHOCARDIOGRAM, TRANSESOPHAGEAL, INTRAOPERATIVE (N/A) as a surgical intervention.  The patient's history has been reviewed, patient examined, no change in status, stable for surgery.  I have reviewed the patient's chart and labs.  Questions were answered to the patient's satisfaction.     Lovada Barwick K Jizel Cheeks

## 2024-02-18 NOTE — Procedures (Signed)
 Extubation Procedure Note  Patient Details:   Name: [name] DOB: [birth year] MRN: [id number]   Airway Documentation:    Vent end date: 02/18/24 Vent end time: 1735   Evaluation  O2 sats: stable throughout Complications: No apparent complications Patient did tolerate procedure well. Bilateral Breath Sounds: Clear, Diminished   Yes  Pt extubated per verbal order from Dr. Kerrin to proceed given pH below 7.30. Pt with positive cuff leak prior and suctioned via ETT/orally. Pt NIF -22 and Vital capacity . Upon extubation pt placed on 4L nasal cannula without complication. Pt able to speak name, give a good cough and no stridor heard at this time.   Geofm FORBES Batty 02/18/2024, 5:42 PM

## 2024-02-18 NOTE — Brief Op Note (Signed)
 02/18/2024  11:37 AM  PATIENT:  [name]  73 y.o. female  PRE-OPERATIVE DIAGNOSIS:  CORONARY ARTERY DISEASE  POST-OPERATIVE DIAGNOSIS:  CORONARY ARTERY DISEASE  PROCEDURE: CORONARY ARTERY BYPASS GRAFTING TIMES 2 WITH LEFT INTERNAL MAMMARY ARTERY AND ENDOSCOPICALLY HARVESTED RIGHT SAPHENOUS VEIN   ECHOCARDIOGRAM, TRANSESOPHAGEAL, INTRAOPERATIVE  Vein harvest time: Vein prep time:  SURGEON:  Lucas Dorise POUR, MD - Primary  PHYSICIAN ASSISTANT: Pacer Dorn  ASSISTANTS: Pesare, Piyanuch, RN, Scrub Person         Lawernce Suzen LABOR, RN, RN First Assistant   ANESTHESIA:   general  EBL:   BLOOD ADMINISTERED:  PRBC's x 1 unit  DRAINS: mediastinal and left pleural drrains   LOCAL MEDICATIONS USED:  NONE  COUNTS:  Correct  DICTATION: .Dragon Dictation  PLAN OF CARE: Admit to inpatient   PATIENT DISPOSITION:  ICU - intubated and hemodynamically stable.   Delay start of Pharmacological VTE agent (>24hrs) due to surgical blood loss or risk of bleeding: yes

## 2024-02-18 NOTE — Anesthesia Procedure Notes (Signed)
 Central Venous Catheter Insertion Performed by: [name], MD, anesthesiologist Start/End12/29/2025 7:23 AM, 02/18/2024 7:24 AM Patient location: Pre-op. Preanesthetic checklist: patient identified, IV checked, risks and benefits discussed, surgical consent, monitors and equipment checked, pre-op evaluation, timeout performed and anesthesia consent Position: Trendelenburg Hand hygiene performed  and maximum sterile barriers used  Total catheter length 10. PA cath was placed.Swan type:thermodilution PA Cath depth:47 Attempts: 1 Patient tolerated the procedure well with no immediate complications.

## 2024-02-18 NOTE — Consult Note (Signed)
 "  NAME:  [name], MRN:  [id number], DOB:  02/14/1951, LOS: 0 ADMISSION DATE:  02/18/2024, CONSULTATION DATE:  02/18/24  REFERRING MD:  Lucas  CHIEF COMPLAINT:  CAD  History of Present Illness:  73 year old female with a significant past medical history of COPD-current smoker, HTN, HLD, CAD s/p bilateral carotid endarterectomy, CAD s/p left circumflex stenting in 2010, PVD-recent vascular ultrasound showing >70% stenosis at the right CIA with claudication of right buttock/thigh, stenosis 50-74% of left SFA, prediabetes, polycythemia vera-care and treatment received by Dr. Timmy, and recent TIA-numbness on the left side of body who presents today for elective CABG x 2 by MD Bartle.  Patient had a recent cardiac catheterization on 01/03/2024 showing three-vessel CAD, EF 55 to 60% with trivial mitral regurg.    Total Pump Time: 67  X Clamp Time: 46 mins  EBL: 1409 Blood product: 1unit PRBCs, 1 unit of Plts  Cell Saver 775   Total Urine output- 1600 mls  Paralytic Reversed- no   Pertinent  Medical History   Past Medical History:  Diagnosis Date   Anxiety    Arthritis    CAD (coronary artery disease)    a. s/p LCX stent 2010 with residual disease.   Carotid artery disease    Cataract    CHF (congestive heart failure) (HCC)    COPD (chronic obstructive pulmonary disease) (HCC)    Depression    GERD (gastroesophageal reflux disease)    Hepatitis C    from blood transfusion in 70s per pt report- treated for this   History of carotid endarterectomy    Hyperlipidemia    borderline, I take a pill every other day   Hypertension    Noncompliance with medication regimen    Osteopenia    Polycythemia    Polycythemia vera (HCC)    Pre-diabetes    PVD (peripheral vascular disease)    Seizures (HCC) 1970   x1- pt. reports that there was never any causative agent     TIA (transient ischemic attack)    UC (ulcerative colitis) (HCC)      Significant Hospital Events: Including  procedures, antibiotic start and stop dates in addition to other pertinent events   12/29 CABG x2, PCCM consult   Interim History / Subjective:  POD 0 CABG   Hypoglycemic 35? Dilutional   CI: 2.34   On neo, txa, insulin , cleviprex    Objective    Blood pressure (!) 186/73, pulse (!) 58, temperature 98.4 F (36.9 C), temperature source Oral, resp. rate 20, height 5' (1.524 m), weight 53.1 kg, SpO2 97%.        Intake/Output Summary (Last 24 hours) at 02/18/2024 1017 Last data filed at 02/18/2024 0950 Gross per 24 hour  Intake 300 ml  Output 360 ml  Net -60 ml   Filed Weights   02/18/24 0602  Weight: 53.1 kg    Examination: General: acute on chronic adult female, lying in icu bed on vent s/p CABG x 2 HEENT: Normocephalic, PERRLA intact, ETT, OG, Pink MM, missing teeth  CV: s1,s2, Paced- 70 Rate, mediastinal and left pleural chest tubes- minimal output , sternotomy dressing clean dry intact  pulm: clear, diminished, no distress on vent  Abs: bs active, soft  Extremities: no edema, left lower extremity- ace wrapping s/p graft harvest  Neuro: Rass -3, paralyzed  GU: foley intact - making urine   Resolved problem list   Assessment and Plan  CAD with triple vessel disease-CABG x2  LIMA graft to  LAD  SVG to RCA  H/O CAD s/p bilateral carotid endarterectomy H/O CAD s/p left circumflex stenting in 2010 EF 55 to 60% with trivial mitral regurg -Initially coming out with labile BPs- having to utilize cleviprex /neo  P: Postoperative management per TCTS Postoperative care per TCTS CT management per protocol Postoperative ancef /vanc for surgical ppx Continue ASA and statin Continue metoprolol  per TCTS Wean insulin  gtt per protocol- initially concerned with hypoglycemia but lab dilutional  Ensure adequate pain control- oxycodone , morphine , tramadol   Continue neosynephrine and cleviprex  to maintain MAP goal of 120-140   Acute respiratory insufficiency, postop COPD-follows  with MD Meade, started on Stiolto 12/03/23  P: Continue on protective ventilation VAP prevention bundle in place Rapid weaning protocol ordered is in place Can use Brovana  and Yulperi   Hypertension P: Holding antihypertensive for now, as patient is requiring low-dose pressors  Hyperlipidemia P: Continue statin   PVD recent vascular ultrasound showing >70% stenosis at the right CIA with claudication of right buttock/thigh, stenosis 50-74% of left SFA P: Continue to monitor neurovascular checks  Will eventually need vascular work up   TIA hx  -left sided weakness hx but per anesthesia moves all extremities, no weakness on left  P: Continue to monitor neuro status  Pre Diabetes  Patient hemoglobin A1c is 5.6 P: Currently on insulin  infusion, monitor fingerstick with goal 140-180  Polycythemia Olena-  Follows with MD Enever, goal to keep Hematocrit <45%, last seen 11/14/23  Expected perioperative blood loss anemia 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 P: Monitor H/H and PLT counts  Anxiety  P: Lexapro   Xanax  prn    Labs   CBC: Recent Labs  Lab 02/12/24 1200 02/18/24 0828 02/18/24 0902  WBC 9.9  --   --   HGB 14.3 11.2* 12.9  HCT 42.7 33.0* 38.0  MCV 94.3  --   --   PLT 276  --   --     Basic Metabolic Panel: Recent Labs  Lab 02/12/24 1200 02/18/24 0828 02/18/24 0902  NA 133* 133* 133*  K 4.7 3.6 3.5  CL 92* 95* 90*  CO2 32  --   --   GLUCOSE 103* 118* 223*  BUN 13 12 12   CREATININE 0.83 0.70 0.50  CALCIUM  10.0  --   --    GFR: Estimated Creatinine Clearance: 45 mL/min (by C-G formula based on SCr of 0.5 mg/dL). Recent Labs  Lab 02/12/24 1200  WBC 9.9    Liver Function Tests: Recent Labs  Lab 02/12/24 1200  AST 27  ALT 18  ALKPHOS 84  BILITOT 0.6  PROT 7.8  ALBUMIN  4.7   No results for input(s): LIPASE, AMYLASE in the last 168 hours. No results for input(s): AMMONIA in the last 168 hours.  ABG    Component Value Date/Time   TCO2  29 02/18/2024 0902     Coagulation Profile: Recent Labs  Lab 02/12/24 1200  INR 0.9    Cardiac Enzymes: No results for input(s): CKTOTAL, CKMB, CKMBINDEX, TROPONINI in the last 168 hours.  HbA1C: Hgb A1c MFr Bld  Date/Time Value Ref Range Status  02/12/2024 12:00 PM 5.6 4.8 - 5.6 % Final    Comment:    (NOTE) Diagnosis of Diabetes The following HbA1c ranges recommended by the American Diabetes Association (ADA) may be used as an aid in the diagnosis of diabetes mellitus.  Hemoglobin             Suggested A1C NGSP%  Diagnosis  <5.7                   Non Diabetic  5.7-6.4                Pre-Diabetic  >6.4                   Diabetic  <7.0                   Glycemic control for                       adults with diabetes.      CBG: No results for input(s): GLUCAP in the last 168 hours.  Review of Systems:   See HPI   Past Medical History:  She,  has a past medical history of Anxiety, Arthritis, CAD (coronary artery disease), Carotid artery disease, Cataract, CHF (congestive heart failure) (HCC), COPD (chronic obstructive pulmonary disease) (HCC), Depression, GERD (gastroesophageal reflux disease), Hepatitis C, History of carotid endarterectomy, Hyperlipidemia, Hypertension, Noncompliance with medication regimen, Osteopenia, Polycythemia, Polycythemia vera (HCC), Pre-diabetes, PVD (peripheral vascular disease), Seizures (HCC) (1970), TIA (transient ischemic attack), and UC (ulcerative colitis) (HCC).   Surgical History:   Past Surgical History:  Procedure Laterality Date   2 stints  10/15/2008   Dr. Nanda   ABDOMINAL SURGERY  [phone]   for excessive bleeding from loss of pregnancy, required blood transfusion post procedure    CAROTID ENDARTERECTOMY Right 2007   cartoid endartherectomy Left 2007   CORONARY ANGIOPLASTY WITH STENT PLACEMENT  2010   CORONARY ANGIOPLASTY WITH STENT PLACEMENT  10/09/2008   CORONARY PRESSURE/FFR STUDY N/A  01/03/2024   Procedure: CORONARY PRESSURE/FFR STUDY;  Surgeon: Darron Deatrice LABOR, MD;  Location: [facility]IVE [facility];  Service: Cardiovascular;  Laterality: N/A;   CORONARY PRESSURE/FFR WITH 3D MAPPING N/A 01/03/2024   Procedure: Coronary Pressure/FFR w/3D Mapping;  Surgeon: Darron Deatrice LABOR, MD;  Location: [facility];  Service: Cardiovascular;  Laterality: N/A;   ENDARTERECTOMY Left 07/24/2016   Procedure: ENDARTERECTOMY CAROTID;  Surgeon: Oris Krystal FALCON, MD;  Location: [facility] OR;  Service: Vascular;  Laterality: Left;   FLEXIBLE SIGMOIDOSCOPY N/A 03/22/2017   Procedure: FLEXIBLE SIGMOIDOSCOPY;  Surgeon: Rollin Dover, MD;  Location: [facility] ENDOSCOPY;  Service: Endoscopy;  Laterality: N/A;   LEFT HEART CATH AND CORONARY ANGIOGRAPHY N/A 01/03/2024   Procedure: LEFT HEART CATH AND CORONARY ANGIOGRAPHY;  Surgeon: Darron Deatrice LABOR, MD;  Location: [facility];  Service: Cardiovascular;  Laterality: N/A;   LYMPH NODE DISSECTION Left 1990's   benign    ORIF ELBOW FRACTURE Right 04/26/2018   Procedure: OPEN REDUCTION INTERNAL FIXATION (ORIF) ELBOW/OLECRANON FRACTURE;  Surgeon: Kendal Franky SQUIBB, MD;  Location: [facility] OR;  Service: Orthopedics;  Laterality: Right;   TUBAL LIGATION       Social History:   reports that she has been smoking cigarettes and e-cigarettes. She started smoking about 58 years ago. She has a 86 pack-year smoking history. She has never been exposed to tobacco smoke. She uses smokeless tobacco. She reports that she does not currently use alcohol. She reports that she does not currently use drugs.   Family History:  Her family history includes Heart disease in her father and mother; Non-Hodgkin's lymphoma in her father. There is no history of Liver disease, Esophageal cancer, Colon cancer, Rectal cancer, or Stomach cancer.   Allergies Allergies[1]   Home Medications  Prior to Admission medications  Medication  Sig Start Date End Date Taking? Authorizing Provider  albuterol   (VENTOLIN  HFA) 108 (90 Base) MCG/ACT inhaler Inhale 1-2 puffs into the lungs every 4 (four) hours as needed for wheezing or shortness of breath. 03/08/12  Yes [provider]  ALPRAZolam  (XANAX ) 0.5 MG tablet Take 0.25-0.5 mg by mouth See admin instructions. Take 0.25 mg by mouth during the day as needed for anxiety and take 0.5 mg at bedtime 11/27/12  Yes [provider]  ascorbic acid (VITAMIN C) 500 MG tablet Take 500 mg by mouth daily.   Yes [provider]  clopidogrel  (PLAVIX ) 75 MG tablet Take 1 tablet by mouth once daily 08/06/23  Yes Court Dorn PARAS, MD  clotrimazole -betamethasone  (LOTRISONE ) cream Apply 1 application  topically 2 (two) times daily as needed (irritation).   Yes [provider]  escitalopram  (LEXAPRO ) 10 MG tablet Take 10 mg by mouth daily.   Yes [provider]  hydrochlorothiazide  (HYDRODIURIL ) 25 MG tablet Take 12.5 mg by mouth daily. 10/13/22  Yes [provider]  lansoprazole (PREVACID) 30 MG capsule Take 30 mg by mouth daily as needed (for ulcer/acid reflex).   Yes [provider]  metoprolol  succinate (TOPROL -XL) 100 MG 24 hr tablet TAKE 1 & 1/2 (ONE & ONE-HALF) TABLETS BY MOUTH ONCE DAILY . Please make yearly appt with Dr. Claudene for May for future refills. 1st attempt 03/14/19  Yes Claudene Victory ORN, MD  rosuvastatin  (CRESTOR ) 10 MG tablet Take 1 tablet (10 mg total) by mouth daily. 11/21/23  Yes Court Dorn PARAS, MD  telmisartan  (MICARDIS ) 80 MG tablet Take 1 tablet (80 mg total) by mouth daily. 07/06/23 07/05/24 Yes Court Dorn PARAS, MD  Tiotropium Bromide-Olodaterol (STIOLTO RESPIMAT ) 2.5-2.5 MCG/ACT AERS Inhale 2 puffs into the lungs daily. Patient taking differently: Inhale 2 puffs into the lungs daily as needed (shortness of breath). 12/03/23  Yes Meade Verdon RAMAN, MD  acetaminophen  (TYLENOL ) 325 MG tablet Take 325-650 mg by mouth every 6 (six) hours as needed for mild pain or headache.    [provider]  nitroGLYCERIN  (NITROSTAT ) 0.4 MG SL tablet Place 0.4 mg under the tongue every 5 (five) minutes as needed for chest pain.    [provider]  Trolamine Salicylate (BLUE-EMU HEMP EX) Apply 1 Application topically daily as needed (joint pain).    [provider]     Critical care time: 45 mins     Christian Ollin Hochmuth AGACNP-BC   [facility] 02/18/2024, 10:39 AM  Please see [email or web address] for pager details.  From 7A-7P if no response, please call [phone]. After hours, please call ELink [phone].            [1]  Allergies Allergen Reactions   Diphenhydramine Hives    Reaction was many years ago, I have taken one since for a bee sting and had no issues. Pt denies allergy to this anymore   Sulfa  Antibiotics Nausea And Vomiting   Sulfur Nausea Only   Amlodipine  Besylate Other (See Comments)    headaches   Nsaids Other (See Comments)    She doesn't take Nsaids because it runs up her BP.   Sulfamethoxazole -Trimethoprim  Nausea And Vomiting   "

## 2024-02-18 NOTE — Anesthesia Postprocedure Evaluation (Signed)
"   Anesthesia Post Note  Patient: Katja Blue  Procedure(s) Performed: CORONARY ARTERY BYPASS GRAFTING TIMES 2 WITH LEFT INTERNAL MAMMARY ARTERY AND ENDOSCOPICALLY HARVESTED LEFT SAPHENOUS VEIN (Chest) ECHOCARDIOGRAM, TRANSESOPHAGEAL, INTRAOPERATIVE     Patient location during evaluation: ICU Anesthesia Type: General Level of consciousness: sedated and patient remains intubated per anesthesia plan Pain management: pain level controlled Vital Signs Assessment: post-procedure vital signs reviewed and stable Respiratory status: patient remains intubated per anesthesia plan Cardiovascular status: stable Postop Assessment: no apparent nausea or vomiting Anesthetic complications: no   No notable events documented.  Last Vitals:  Vitals:   02/18/24 1400 02/18/24 1415  BP: 122/67 112/66  Pulse:  80  Resp: 19 19  Temp: (!) 35.5 C (!) 35.3 C  SpO2:  99%    Last Pain:  Vitals:   02/18/24 0624  TempSrc:   PainSc: 0-No pain                 Debby FORBES Like      "

## 2024-02-18 NOTE — Anesthesia Procedure Notes (Signed)
 Arterial Line Insertion Start/End12/29/2025 7:30 AM, 02/18/2024 8:19 AM Performed by: [name], MD, Delores Dus, CRNA  Patient location: OR. Preanesthetic checklist: patient identified, IV checked, site marked, risks and benefits discussed, surgical consent, monitors and equipment checked, pre-op evaluation, timeout performed and anesthesia consent Lidocaine  1% used for infiltration Left, brachial was placed Catheter size: 20 G Hand hygiene performed  and maximum sterile barriers used   Attempts: 3 Procedure performed using ultrasound to evaluate access site. Ultrasound Notes:relevant anatomy identified, ultrasound used to visualize needle entry and vessel patent under ultrasound. Following insertion, dressing applied. Post procedure assessment: normal and unchanged  Post procedure complications: unsuccessful attempts and second provider assisted. Patient tolerated the procedure well with no immediate complications. Additional procedure comments: Multiple attempts by CRNA on left radial artery followed by 2 attempts by Dr. Lucious on left brachial artery with ultrasound, unable to thread catheter. Pressure dressings applied.SABRA

## 2024-02-18 NOTE — Progress Notes (Signed)
 Pt placed on BIPAP per Dr. Gretta order. Pt tolerating well at this time

## 2024-02-18 NOTE — Anesthesia Procedure Notes (Signed)
 Procedure Name: Intubation Date/Time: 02/18/2024 8:17 AM  Performed by: Delores Dus, CRNAPre-anesthesia Checklist: Patient identified, Emergency Drugs available, Suction available and Patient being monitored Patient Re-evaluated:Patient Re-evaluated prior to induction Oxygen Delivery Method: Circle system utilized Preoxygenation: Pre-oxygenation with 100% oxygen Induction Type: IV induction Ventilation: Mask ventilation without difficulty Laryngoscope Size: Miller and 2 Grade View: Grade I Tube type: Oral Tube size: 8.0 mm Number of attempts: 1 Airway Equipment and Method: Stylet and Oral airway Placement Confirmation: ETT inserted through vocal cords under direct vision, positive ETCO2 and breath sounds checked- equal and bilateral Secured at: 20 cm Tube secured with: Tape Dental Injury: Teeth and Oropharynx as per pre-operative assessment

## 2024-02-18 NOTE — Anesthesia Procedure Notes (Signed)
 Central Venous Catheter Insertion Performed by: [name], MD, anesthesiologist Start/End12/29/2025 7:17 AM, 02/18/2024 7:23 AM Preanesthetic checklist: patient identified, IV checked, risks and benefits discussed, surgical consent, monitors and equipment checked, pre-op evaluation, timeout performed and anesthesia consent Position: Trendelenburg Lidocaine  1% used for infiltration and patient sedated Hand hygiene performed , maximum sterile barriers used  and Seldinger technique used Catheter size: 8.5 Fr Central line was placed.Sheath introducer Procedure performed using ultrasound to evaluate access site. Ultrasound Notes:relevant anatomy identified, ultrasound used to visualize needle entry, vessel patent under ultrasound and image(s) printed for medical record. Attempts: 1 Following insertion, line sutured, dressing applied and Biopatch. Post procedure assessment: blood return through all ports, free fluid flow and no air  Patient tolerated the procedure well with no immediate complications.

## 2024-02-18 NOTE — Transfer of Care (Signed)
 Immediate Anesthesia Transfer of Care Note  Patient: [name]  Procedure(s) Performed: CORONARY ARTERY BYPASS GRAFTING TIMES 2 WITH LEFT INTERNAL MAMMARY ARTERY AND ENDOSCOPICALLY HARVESTED LEFT SAPHENOUS VEIN (Chest) ECHOCARDIOGRAM, TRANSESOPHAGEAL, INTRAOPERATIVE  Patient Location: PACU  Anesthesia Type:General  Level of Consciousness: Patient remains intubated per anesthesia plan  Airway & Oxygen Therapy: Patient remains intubated per anesthesia plan and Patient placed on Ventilator (see vital sign flow sheet for setting)  Post-op Assessment: Report given to RN and Post -op Vital signs reviewed and stable  Post vital signs: Reviewed and stable  Last Vitals:  Vitals Value Taken Time  BP 122/56   Temp    Pulse 70   Resp 12   SpO2 100     Last Pain:  Vitals:   02/18/24 0624  TempSrc:   PainSc: 0-No pain      Patients Stated Pain Goal: 0 (02/18/24 0624)  Complications: No notable events documented.

## 2024-02-18 NOTE — Op Note (Addendum)
 "                                                                                            CARDIOVASCULAR SURGERY OPERATIVE NOTE  02/18/2024  Surgeon:  Dorise LOIS Fellers, MD  First Assistant: Laurel Becket,  PA-C:   An experienced assistant was required given the complexity of this surgery and the standard of surgical care. The assistant was needed for endoscopic vein harvest, exposure, dissection, suctioning, retraction of delicate tissues and sutures, instrument exchange and for overall help during this procedure.   Preoperative Diagnosis:  Severe multi-vessel coronary artery disease   Postoperative Diagnosis:  Same   Procedure:  Median Sternotomy Extracorporeal circulation 3.   Coronary artery bypass grafting x 2  Left internal mammary artery graft to the LAD SVG to RCA   4.   Endoscopic vein harvest from the left leg 5.   Insertion of left common femoral arterial line for BP monitoring.    Anesthesia:  General Endotracheal   Clinical History/Surgical Indication:  This 73 year old woman with a history of hypertension, hyperlipidemia, COPD and active smoking, carotid artery disease status post bilateral carotid endarterectomy, polycythemia vera treated by Dr. Timmy, peripheral vascular disease with recent vascular ultrasound showing greater than 70% stenosis at the origin of the right CIA with right buttock and thigh claudication, coronary artery disease status post left circumflex stenting in 2010, possible recent TIA with numbness on the left side of the body, and multivessel coronary disease. She presents with episodes of substernal chest discomfort requiring sublingual nitroglycerin . She had a Myoview  stress test on 12/13/2023 that was intermediate risk consistent with ischemia in the left circumflex marginal or posterolateral branch. Cardiac catheterization on 01/03/2024 showed severe three-vessel coronary disease with 99% mid to distal left circumflex stenosis within a  previously placed stent. There is about 40% stenosis at the ostium of the LAD and 60% proximal, 70% mid vessel stenosis in the LAD. The right coronary artery has a 90% mid to distal stenosis. Left ventricular ejection fraction on echocardiogram was 55 to 60% with trivial mitral regurgitation.  I agree that coronary bypass graft surgery is the best treatment to prevent further ischemia and infarction and improve her quality of life. I discussed the operative procedure with the patient and family including alternatives, benefits and risks; including but not limited to bleeding, blood transfusion, infection, stroke, myocardial infarction, graft failure, heart block requiring a permanent pacemaker, organ dysfunction, and death. [name] understands and agrees to proceed.   Preparation:  The patient was seen in the preoperative holding area and the correct patient, correct operation were confirmed with the patient after reviewing the medical record and catheterization. The consent was signed by me. Preoperative antibiotics were given. A pulmonary arterial line and radial arterial line were placed by the anesthesia team. The patient was taken back to the operating room and positioned supine on the operating room table. After being placed under general endotracheal anesthesia by the anesthesia team a foley catheter was placed. The neck, chest, abdomen, and both legs were prepped with betadine soap and solution and draped in the usual sterile manner.  A surgical time-out was taken and the correct patient and operative procedure were confirmed with the nursing and anesthesia staff.  Insertion of right common femoral arterial line:  The left radial arterial line failed before the patient was prepped. Anesthesia attempted a left brachial arterial line but could not get that to work. Therefore we decided on a left femoral arterial line. US  guidance was used. Micropuncture Seldinger technique was used to insert  the line without difficulty.   Cardiopulmonary Bypass:  A median sternotomy was performed. The pericardium was opened in the midline. Right ventricular function appeared normal. The ascending aorta was of normal size and had no palpable plaque. There were no contraindications to aortic cannulation or cross-clamping. The patient was fully systemically heparinized and the ACT was maintained > 400 sec. The proximal aortic arch was cannulated with a 20 F aortic cannula for arterial inflow. Venous cannulation was performed via the right atrial appendage using a two-staged venous cannula. An antegrade cardioplegia/vent cannula was inserted into the mid-ascending aorta. Aortic occlusion was performed with a single cross-clamp. Systemic cooling to 34 degrees Centigrade and topical cooling of the heart with iced saline were used. Hyperkalemic antegrade cold blood cardioplegia was used to induce diastolic arrest and was then given at about 20 minute intervals throughout the period of arrest to maintain myocardial temperature at or below 10 degrees centigrade. A temperature probe was inserted into the interventricular septum and an insulating pad was placed in the pericardium.   Left internal mammary artery harvest:  The left side of the sternum was retracted using the Rultract retractor. The left internal mammary artery was harvested as a pedicle graft. All side branches were clipped. It was a small to medium-sized vessel of good quality with good blood flow. It was ligated distally and divided. It was sprayed with topical papaverine solution to prevent vasospasm.   Endoscopic vein harvest: Performed by Laurel Becket, PA-C  Preop vein mapping showed small GSV bilaterally but smaller on the right and with her right lower extremity ischemia we decided to harvest the left GSV. Radial arteries could not be used since both hands were radial dependent.   The left greater saphenous vein was harvested  endoscopically through a 2 cm incision medial to the left knee. It was harvested from the upper thigh to below the knee. It was a medium-sized vein of good quality in the mid to upper thigh but below that became very small and fragile and really not suitable for conduit. The side branches were all ligated with 4-0 silk ties.    Coronary arteries:  The coronary arteries were examined.  LAD:  medium caliber vessel with no distal disease. The diagonal was also a medium caliber vessel but did not have a critical stenosis and with limited conduit I decided not to graft it. LCX:  The OM that was previously stented was diffusely diseased and tapered quickly beyond the stent and not suitable for grafting. RCA:  Large vessel that was soft enough to graft proximal to the PDA.   Grafts: Assisted by Laurel Becket, PA-C.  LIMA to the LAD: 1.75 mm. It was sewn end to side using 8-0 prolene continuous suture. SVG to RCA:  2.5 mm. It was sewn end to side using 7-0 prolene continuous suture.   The proximal vein graft anastomosis was performed to the mid-ascending aorta using continuous 6-0 prolene suture. A graft marker was placed around the proximal anastomosis.   Completion:  The patient was rewarmed to 37  degrees Centigrade. The clamp was removed from the LIMA pedicle and there was rapid warming of the septum and return of ventricular fibrillation. The crossclamp was removed with a time of 45 minutes. There was spontaneous return of sinus rhythm. The distal and proximal anastomoses were checked for hemostasis. The position of the grafts was satisfactory. Two temporary epicardial pacing wires were placed on the right atrium and two on the right ventricle. Her Hgb dropped to 5 on pump and she was given 2 units of PRBC's prior to weaning from the pump. The patient was weaned from CPB without difficulty on no inotropes. CPB time was 66 minutes. Cardiac output was 5 LPM. TEE showed normal LV systolic  function with normal valvular function. Heparin  was fully reversed with protamine  and the aortic and venous cannulas removed. One additional unit of PRBC's and 1 unit of platelets were given by anesthesia. Hemostasis was achieved. Mediastinal and left pleural drainage tubes were placed. The sternum was closed with  #6 stainless steel wires. The fascia was closed with continuous # 1 vicryl suture. The subcutaneous tissue was closed with 2-0 vicryl continuous suture. The skin was closed with 3-0 vicryl subcuticular suture. All sponge, needle, and instrument counts were reported correct at the end of the case. Dry sterile dressings were placed over the incisions and around the chest tubes which were connected to pleurevac suction. The patient was then transported to the surgical intensive care unit in stable condition.    "

## 2024-02-19 ENCOUNTER — Inpatient Hospital Stay (HOSPITAL_COMMUNITY)

## 2024-02-19 ENCOUNTER — Encounter (HOSPITAL_COMMUNITY): Payer: Self-pay | Admitting: Surgery

## 2024-02-19 DIAGNOSIS — D696 Thrombocytopenia, unspecified: Secondary | ICD-10-CM | POA: Diagnosis not present

## 2024-02-19 DIAGNOSIS — J449 Chronic obstructive pulmonary disease, unspecified: Secondary | ICD-10-CM | POA: Diagnosis not present

## 2024-02-19 DIAGNOSIS — Z951 Presence of aortocoronary bypass graft: Secondary | ICD-10-CM

## 2024-02-19 DIAGNOSIS — R739 Hyperglycemia, unspecified: Secondary | ICD-10-CM | POA: Diagnosis not present

## 2024-02-19 LAB — CBC
HCT: 49.9 % — ABNORMAL HIGH (ref 36.0–46.0)
HCT: 42.3 % (ref 36.0–46.0)
HCT: 41.9 % (ref 36.0–46.0)
Hemoglobin: 17.7 g/dL — ABNORMAL HIGH (ref 12.0–15.0)
Hemoglobin: 15 g/dL (ref 12.0–15.0)
Hemoglobin: 14.2 g/dL (ref 12.0–15.0)
MCH: 31.5 pg (ref 26.0–34.0)
MCH: 31.8 pg (ref 26.0–34.0)
MCH: 30.9 pg (ref 26.0–34.0)
MCHC: 35.5 g/dL (ref 30.0–36.0)
MCHC: 35.5 g/dL (ref 30.0–36.0)
MCHC: 33.9 g/dL (ref 30.0–36.0)
MCV: 88.8 fL (ref 80.0–100.0)
MCV: 89.6 fL (ref 80.0–100.0)
MCV: 91.3 fL (ref 80.0–100.0)
Platelets: 127 K/uL — ABNORMAL LOW (ref 150–400)
Platelets: 160 K/uL (ref 150–400)
Platelets: 130 K/uL — ABNORMAL LOW (ref 150–400)
RBC: 5.62 MIL/uL — ABNORMAL HIGH (ref 3.87–5.11)
RBC: 4.72 MIL/uL (ref 3.87–5.11)
RBC: 4.59 MIL/uL (ref 3.87–5.11)
RDW: 13.5 % (ref 11.5–15.5)
RDW: 14.4 % (ref 11.5–15.5)
RDW: 14.3 % (ref 11.5–15.5)
WBC: 11.8 K/uL — ABNORMAL HIGH (ref 4.0–10.5)
WBC: 10.7 K/uL — ABNORMAL HIGH (ref 4.0–10.5)
WBC: 11.3 K/uL — ABNORMAL HIGH (ref 4.0–10.5)
nRBC: 0 % (ref 0.0–0.2)
nRBC: 0 % (ref 0.0–0.2)
nRBC: 0 % (ref 0.0–0.2)

## 2024-02-19 LAB — PREPARE PLATELET PHERESIS: Unit division: 0

## 2024-02-19 LAB — BPAM PLATELET PHERESIS
Blood Product Expiration Date: 202512292359
ISSUE DATE / TIME: 202512291146
Unit Type and Rh: 5100

## 2024-02-19 LAB — BASIC METABOLIC PANEL WITH GFR
Anion gap: 8 (ref 5–15)
Anion gap: 9 (ref 5–15)
BUN: 7 mg/dL — ABNORMAL LOW (ref 8–23)
BUN: 10 mg/dL (ref 8–23)
CO2: 27 mmol/L (ref 22–32)
CO2: 29 mmol/L (ref 22–32)
Calcium: 7.7 mg/dL — ABNORMAL LOW (ref 8.9–10.3)
Calcium: 7.9 mg/dL — ABNORMAL LOW (ref 8.9–10.3)
Chloride: 99 mmol/L (ref 98–111)
Chloride: 95 mmol/L — ABNORMAL LOW (ref 98–111)
Creatinine, Ser: 0.56 mg/dL (ref 0.44–1.00)
Creatinine, Ser: 1.11 mg/dL — ABNORMAL HIGH (ref 0.44–1.00)
GFR, Estimated: >60 mL/min
GFR, Estimated: 52 mL/min — ABNORMAL LOW
Glucose, Bld: 150 mg/dL — ABNORMAL HIGH (ref 70–99)
Glucose, Bld: 136 mg/dL — ABNORMAL HIGH (ref 70–99)
Potassium: 4.1 mmol/L (ref 3.5–5.1)
Potassium: 4.3 mmol/L (ref 3.5–5.1)
Sodium: 134 mmol/L — ABNORMAL LOW (ref 135–145)
Sodium: 133 mmol/L — ABNORMAL LOW (ref 135–145)

## 2024-02-19 LAB — GLUCOSE, CAPILLARY
Glucose-Capillary: 111 mg/dL — ABNORMAL HIGH (ref 70–99)
Glucose-Capillary: 111 mg/dL — ABNORMAL HIGH (ref 70–99)
Glucose-Capillary: 111 mg/dL — ABNORMAL HIGH (ref 70–99)
Glucose-Capillary: 113 mg/dL — ABNORMAL HIGH (ref 70–99)
Glucose-Capillary: 118 mg/dL — ABNORMAL HIGH (ref 70–99)
Glucose-Capillary: 127 mg/dL — ABNORMAL HIGH (ref 70–99)
Glucose-Capillary: 132 mg/dL — ABNORMAL HIGH (ref 70–99)
Glucose-Capillary: 144 mg/dL — ABNORMAL HIGH (ref 70–99)
Glucose-Capillary: 161 mg/dL — ABNORMAL HIGH (ref 70–99)

## 2024-02-19 LAB — POCT I-STAT 7, (LYTES, BLD GAS, ICA,H+H)
Acid-Base Excess: 0 mmol/L (ref 0.0–2.0)
Bicarbonate: 27.2 mmol/L (ref 20.0–28.0)
Calcium, Ion: 1.15 mmol/L (ref 1.15–1.40)
HCT: 44 % (ref 36.0–46.0)
Hemoglobin: 15 g/dL (ref 12.0–15.0)
O2 Saturation: 95 %
Patient temperature: 37.2
Potassium: 3.8 mmol/L (ref 3.5–5.1)
Sodium: 135 mmol/L (ref 135–145)
TCO2: 29 mmol/L (ref 22–32)
pCO2 arterial: 52.7 mmHg — ABNORMAL HIGH (ref 32–48)
pH, Arterial: 7.322 — ABNORMAL LOW (ref 7.35–7.45)
pO2, Arterial: 83 mmHg (ref 83–108)

## 2024-02-19 LAB — MAGNESIUM
Magnesium: 2.4 mg/dL (ref 1.7–2.4)
Magnesium: 2.1 mg/dL (ref 1.7–2.4)

## 2024-02-19 LAB — PHOSPHORUS: Phosphorus: 3.7 mg/dL (ref 2.5–4.6)

## 2024-02-19 MED ORDER — POTASSIUM CHLORIDE CRYS ER 20 MEQ PO TBCR
20.0000 meq | EXTENDED_RELEASE_TABLET | Freq: Three times a day (TID) | ORAL | Status: AC
  Administered 2024-02-19 (×3): 20 meq via ORAL
  Filled 2024-02-19 (×3): qty 1

## 2024-02-19 MED ORDER — ENOXAPARIN SODIUM 40 MG/0.4ML IJ SOSY
40.0000 mg | PREFILLED_SYRINGE | Freq: Every day | INTRAMUSCULAR | Status: DC
  Administered 2024-02-19 – 2024-02-21 (×3): 40 mg via SUBCUTANEOUS
  Filled 2024-02-19 (×3): qty 0.4

## 2024-02-19 MED ORDER — FUROSEMIDE 10 MG/ML IJ SOLN
40.0000 mg | Freq: Two times a day (BID) | INTRAMUSCULAR | Status: AC
  Administered 2024-02-19 (×2): 40 mg via INTRAVENOUS
  Filled 2024-02-19 (×2): qty 4

## 2024-02-19 MED ORDER — INSULIN ASPART 100 UNIT/ML IJ SOLN
0.0000 [IU] | INTRAMUSCULAR | Status: DC
  Administered 2024-02-19: 2 [IU] via SUBCUTANEOUS
  Filled 2024-02-19: qty 2

## 2024-02-19 MED ORDER — FENTANYL CITRATE (PF) 50 MCG/ML IJ SOSY
25.0000 ug | PREFILLED_SYRINGE | Freq: Once | INTRAMUSCULAR | Status: AC
  Administered 2024-02-19: 25 ug via INTRAVENOUS
  Filled 2024-02-19: qty 1

## 2024-02-19 MED FILL — Mannitol IV Soln 20%: INTRAVENOUS | Qty: 500 | Status: AC

## 2024-02-19 MED FILL — Calcium Chloride Inj 10%: INTRAVENOUS | Qty: 10 | Status: AC

## 2024-02-19 MED FILL — Albumin, Human Inj 5%: INTRAVENOUS | Qty: 250 | Status: AC

## 2024-02-19 MED FILL — Lidocaine HCl Local Soln Prefilled Syringe 100 MG/5ML (2%): INTRAMUSCULAR | Qty: 5 | Status: AC

## 2024-02-19 MED FILL — Heparin Sodium (Porcine) Inj 1000 Unit/ML: INTRAMUSCULAR | Qty: 10 | Status: AC

## 2024-02-19 MED FILL — Sodium Chloride IV Soln 0.9%: INTRAVENOUS | Qty: 2000 | Status: AC

## 2024-02-19 MED FILL — Electrolyte-R (PH 7.4) Solution: INTRAVENOUS | Qty: 4000 | Status: AC

## 2024-02-19 MED FILL — Sodium Bicarbonate IV Soln 8.4%: INTRAVENOUS | Qty: 50 | Status: AC

## 2024-02-19 NOTE — Discharge Summary (Signed)
 "      [address] [place] [address]             [phone]        Physician Discharge Summary  Patient ID: [name] MRN: [id number] DOB/AGE: [birth year] 73 y.o.  Admit date: 02/18/2024 Discharge date: 02/23/2024  Admission Diagnoses:  Patient Active Problem List   Diagnosis Date Noted   Claudication in peripheral vascular disease 11/21/2023   History of syncope 04/26/2020   Pre-op evaluation 04/26/2020   Monteggia's fracture of right ulna, init for clos fx 05/02/2018   Radius/ulna fracture, right, closed, initial encounter 04/26/2018   CAD S/P percutaneous coronary angioplasty 04/26/2018   Polycythemia vera (HCC) 04/26/2018   COPD (chronic obstructive pulmonary disease) (HCC) 04/26/2018   Hyperlipidemia LDL goal <70 10/30/2017   Colitis 03/21/2017   Colitis with rectal bleeding 03/21/2017   Polycythemia vera (HCC) 03/21/2017   Carotid artery stenosis 07/24/2016   Chest pain 03/09/2015   Tobacco abuse 03/09/2015   Influenza 03/02/2012   COPD exacerbation (HCC) 03/02/2012   Hypoxia 03/02/2012   Hyponatremia 03/02/2012   Hematochezia 10/30/2011   Coronary artery disease involving native coronary artery of native heart with angina pectoris    GERD (gastroesophageal reflux disease)    Polycythemia    Irritable bowel syndrome 08/15/2010   HERPES ZOSTER 10/07/2009   LIPOMA 10/07/2009   HERPETIC GINGIVOSTOMATITIS 01/06/2009   UNSPECIFIED VITAMIN D  DEFICIENCY 01/06/2009   ESOPHAGEAL REFLUX 06/04/2008   ANXIETY DEPRESSION 01/24/2007   OSTEOARTHRITIS, GENERALIZED, MULTIPLE JOINTS 11/21/2006   Acute hepatitis C virus infection 10/23/2006   Essential hypertension 10/23/2006   Discharge Diagnoses:  Patient Active Problem List   Diagnosis Date Noted   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   Claudication in peripheral vascular disease 11/21/2023   History of syncope 04/26/2020   Pre-op evaluation 04/26/2020   Monteggia's fracture of right ulna, init for  clos fx 05/02/2018   Radius/ulna fracture, right, closed, initial encounter 04/26/2018   CAD S/P percutaneous coronary angioplasty 04/26/2018   Polycythemia vera (HCC) 04/26/2018   COPD (chronic obstructive pulmonary disease) (HCC) 04/26/2018   Hyperlipidemia LDL goal <70 10/30/2017   Colitis 03/21/2017   Colitis with rectal bleeding 03/21/2017   Polycythemia vera (HCC) 03/21/2017   Carotid artery stenosis 07/24/2016   Chest pain 03/09/2015   Tobacco abuse 03/09/2015   Influenza 03/02/2012   COPD exacerbation (HCC) 03/02/2012   Hypoxia 03/02/2012   Hyponatremia 03/02/2012   Hematochezia 10/30/2011   Coronary artery disease involving native coronary artery of native heart with angina pectoris    GERD (gastroesophageal reflux disease)    Polycythemia    Irritable bowel syndrome 08/15/2010   HERPES ZOSTER 10/07/2009   LIPOMA 10/07/2009   HERPETIC GINGIVOSTOMATITIS 01/06/2009   UNSPECIFIED VITAMIN D  DEFICIENCY 01/06/2009   ESOPHAGEAL REFLUX 06/04/2008   ANXIETY DEPRESSION 01/24/2007   OSTEOARTHRITIS, GENERALIZED, MULTIPLE JOINTS 11/21/2006   Acute hepatitis C virus infection 10/23/2006   Essential hypertension 10/23/2006   Discharged Condition: good  PCP is Ransom Other, MD Referring Provider is Court Dorn PARAS, MD  HPI: The patient is a 73 year old woman with a history of hypertension, hyperlipidemia, COPD and active smoking, carotid artery disease status post bilateral carotid endarterectomy, polycythemia vera treated by Dr. Timmy, peripheral vascular disease with recent vascular ultrasound showing greater than 70% stenosis at the origin of the right CIA with right buttock and thigh claudication, coronary artery disease status post left circumflex stenting in 2010, possible recent  TIA with numbness on the left side of the body, and multivessel coronary disease.  She presents with episodes of substernal chest discomfort requiring sublingual nitroglycerin .  She had a Myoview   stress test on 12/13/2023 that was intermediate risk consistent with ischemia in the left circumflex marginal or posterolateral branch.  Cardiac catheterization on 01/03/2024 showed severe three-vessel coronary disease with 99% mid to distal left circumflex stenosis within a previously placed stent.  There is about 40% stenosis at the ostium of the LAD and 60% proximal, 70% mid vessel stenosis in the LAD.  The right coronary artery has a 90% mid to distal stenosis.  Left ventricular ejection fraction on echocardiogram was 55 to 60% with trivial mitral regurgitation.  Dr. Lucas discussed the need for coronary artery bypass grafting surgery. Potential risks, benefits, and complications of the surgery were discussed with the patient and she agreed to proceed with surgery. Pre operative carotid duplex US  showed no significant internal carotid artery stenosis bilaterally.   Hospital Course: Patient underwent a CABG x 2 by Dr. Lucas on 02/18/24.  Following the procedure she separated form cardiopulmonary bypass requiring no inotropic support. Cleviprex  was used to manage hypertension early post-op. She was transferred to the ICU in stable condition.  She was extubated the evening of surgery.  She required BIPAP overnight due to hypercarbia.  Post operative EKG revealed a junctional bradycardia due to this atrial pacing was continued and beta blocker therapy was not initiated.  She was started on Lasix  to facilitate diuresis.  Her arterial line and swan ganz catheter, and chest tubes  were removed without difficulty.  Her heart rate recovered to NSR with rates in the 70s.  She was having thick sputum which was difficult to expectorate.  She was maintaining NSR and her pacing wires were removed without difficulty on 02/21/2024.   She was started on low dose Lopresor.  She was stable for transfer to the progressive care unit on 02/21/2024.  The patient continued to maintain NSR.  She was tolerating low dose beta blocker  without difficulty Chest xray showed a small posterior left pleural effusion.  She was ambulating without difficulty.  Her surgical incisions are healing without evidence of infection.  She is stable for discharge home today.  Consults: None  Significant Diagnostic Studies: angiography:     Prox Cx lesion is 10% stenosed.   Mid Cx to Dist Cx lesion is 99% stenosed.   Prox LAD to Mid LAD lesion is 60% stenosed.   2nd Diag lesion is 40% stenosed.   Dist LM to Ost LAD lesion is 40% stenosed.   Prox RCA to Mid RCA lesion is 30% stenosed.   Mid RCA to Dist RCA lesion is 90% stenosed.   Mid LAD lesion is 70% stenosed.   The left ventricular systolic function is normal.   LV end diastolic pressure is mildly elevated.   The left ventricular ejection fraction is 55-65% by visual estimate.   1.  Severe three-vessel coronary artery disease.  The LAD disease appeared borderline but was highly significant with both virtual FFR and RFR.  RFR was 0.78.  There is subtotal occlusion of the mid to distal left circumflex stents and high-grade stenosis in the mid to distal right coronary artery.  The coronary arteries are moderately to severely calcified. 2.  Normal LV systolic function mildly elevated left ventricular end-diastolic pressure. 3.  Not able to get into the descending aorta to image the right iliac stenosis due to tortuosity and  significant radial artery spasm.   Recommendations: Recommend evaluation for CABG.  Clopidogrel  can be discontinued once surgery is scheduled.   Treatments: surgery:   CARDIOVASCULAR SURGERY OPERATIVE NOTE   02/18/2024   Surgeon:  Dorise LOIS Fellers, MD   First Assistant: Laurel Becket,  PA-C:   An experienced assistant was required given the complexity of this surgery and the standard of surgical care. The assistant was needed for endoscopic vein harvest, exposure, dissection, suctioning, retraction of delicate tissues and sutures, instrument exchange and for  overall help during this procedure.     Preoperative Diagnosis:  Severe multi-vessel coronary artery disease     Postoperative Diagnosis:  Same     Procedure:   Median Sternotomy Extracorporeal circulation 3.   Coronary artery bypass grafting x 2   Left internal mammary artery graft to the LAD SVG to RCA     4.   Endoscopic vein harvest from the left leg 5.   Insertion of left common femoral arterial line for BP monitoring.   Discharge Exam: Blood pressure (!) 128/54, pulse 74, temperature 98.9 F (37.2 C), temperature source Oral, resp. rate 18, height 5' (1.524 m), weight 53.1 kg, SpO2 93%.  General appearance: alert, cooperative, and no distress Heart: regular rate and rhythm, few brief episodes of atrial fibrillation yesterday morning, self terminated.  Otherwise, stable sinus rhythm Lungs: diminished breath sounds bibasilar Abdomen: soft, non-tender Extremities: No peripheral edema  Wounds: clean and dry, expected bruising left thigh   Discharge Medications:  The patient has been discharged on:   1.Beta Blocker:  Yes [x   ]                              No   [   ]                              If No, reason:  2.Ace Inhibitor/ARB: Yes [   ]                                     No  [  x  ]                                     If No, reason: titration of BB  3.Statin:   Yes [ x  ]                  No  [   ]                  If No, reason:  4.Ecasa:  Yes  [ x  ]                  No   [   ]                  If No, reason:  Patient had ACS upon admission: no  Plavix /P2Y12 inhibitor: Yes [  x ]                                      No  [   ]  Discharge Instructions     Amb Referral to Cardiac Rehabilitation   Complete by: As directed    Diagnosis: CABG   CABG X ___: 2   After initial evaluation and assessments completed: Virtual Based Care may be provided alone or in conjunction with Phase 2 Cardiac Rehab based on patient barriers.: Yes   Intensive  Cardiac Rehabilitation (ICR) [facility] location only OR Traditional Cardiac Rehabilitation (TCR) *If criteria for ICR are not met will enroll in TCR ([facility] only): Yes      Allergies as of 02/23/2024       Reactions   Diphenhydramine Hives   Reaction was many years ago, I have taken one since for a bee sting and had no issues. Pt denies allergy to this anymore   Sulfa  Antibiotics Nausea And Vomiting   Sulfur Nausea Only   Amlodipine  Besylate Other (See Comments)   headaches   Nsaids Other (See Comments)   She doesn't take Nsaids because it runs up her BP.   Sulfamethoxazole -trimethoprim  Nausea And Vomiting        Medication List     STOP taking these medications    metoprolol  succinate 100 MG 24 hr tablet Commonly known as: TOPROL -XL   telmisartan  80 MG tablet Commonly known as: MICARDIS        TAKE these medications    acetaminophen  325 MG tablet Commonly known as: TYLENOL  Take 325-650 mg by mouth every 6 (six) hours as needed for mild pain or headache.   ALPRAZolam  0.5 MG tablet Commonly known as: XANAX  Take 0.25-0.5 mg by mouth See admin instructions. Take 0.25 mg by mouth during the day as needed for anxiety and take 0.5 mg at bedtime   ascorbic acid 500 MG tablet Commonly known as: VITAMIN C Take 500 mg by mouth daily.   aspirin  EC 81 MG tablet Take 1 tablet (81 mg total) by mouth daily. Swallow whole.   BLUE-EMU HEMP EX Apply 1 Application topically daily as needed (joint pain).   clopidogrel  75 MG tablet Commonly known as: PLAVIX  Take 1 tablet by mouth once daily   clotrimazole -betamethasone  cream Commonly known as: LOTRISONE  Apply 1 application  topically 2 (two) times daily as needed (irritation).   escitalopram  10 MG tablet Commonly known as: LEXAPRO  Take 10 mg by mouth daily.   guaiFENesin  600 MG 12 hr tablet Commonly known as: MUCINEX  Take 1 tablet (600 mg total) by mouth 2 (two) times daily.   hydrochlorothiazide  25 MG tablet Commonly known  as: HYDRODIURIL  Take 12.5 mg by mouth daily.   lansoprazole 30 MG capsule Commonly known as: PREVACID Take 30 mg by mouth daily as needed (for ulcer/acid reflex).   metoprolol  tartrate 25 MG tablet Commonly known as: LOPRESSOR  Take 1 tablet (25 mg total) by mouth 2 (two) times daily.   nitroGLYCERIN  0.4 MG SL tablet Commonly known as: NITROSTAT  Place 0.4 mg under the tongue every 5 (five) minutes as needed for chest pain.   rosuvastatin  10 MG tablet Commonly known as: CRESTOR  Take 1 tablet (10 mg total) by mouth daily.   Stiolto Respimat  2.5-2.5 MCG/ACT Aers Generic drug: Tiotropium Bromide-Olodaterol Inhale 2 puffs into the lungs daily. What changed:  when to take this reasons to take this   traMADol  50 MG tablet Commonly known as: ULTRAM  Take 1 tablet (50 mg total) by mouth every 6 (six) hours as needed for moderate pain (pain score 4-6).   Ventolin  HFA 108 (90 Base) MCG/ACT inhaler Generic drug: albuterol  Inhale 1-2 puffs into the lungs  every 4 (four) hours as needed for wheezing or shortness of breath.        Follow-up Information     Court Dorn PARAS, MD Follow up on 03/12/2024.   Specialties: Cardiology, Radiology Why: Appointment is at 10:45 Contact information: [address] [place] [address] [phone]         Rutha Manuelita HERO, PA-C Follow up on 03/03/2024.   Specialties: Physician Assistant, Thoracic Surgery Why: Please arrive by 1:00 in order to have PA/LAT CXR taken PRIOR to office appointment. CXR located on the SECOND floor of the same building Appointment time is at 2:00 Contact information: [address] [place] [address] [phone]         Ransom Other, MD. Call.   Specialty: Internal Medicine Why: for a follow up for 2-3 weeks Contact information: [address] [place] [address] [phone]                 Signed:  Deroy Noah G. Waynette Towers, PA-C  02/23/2024, 10:53  AM   "

## 2024-02-19 NOTE — Progress Notes (Signed)
 [facility] Evening Rounds:  Hemodynamically stable. Still atrial pacing at 80.  Sats 92% 4L  Diuresing well.  Ambulated a small lap.  Complains of chest pain.      Latest Ref Rng & Units 02/19/2024    4:36 PM 02/19/2024    4:38 AM 02/19/2024    4:33 AM  CBC  WBC 4.0 - 10.5 K/uL 11.3   10.7   Hemoglobin 12.0 - 15.0 g/dL 85.7  84.9  84.9   Hematocrit 36.0 - 46.0 % 41.9  44.0  42.3   Platelets 150 - 400 K/uL 130   160        Latest Ref Rng & Units 02/19/2024    4:36 PM 02/19/2024    4:38 AM 02/19/2024    4:33 AM  BMP  Glucose 70 - 99 mg/dL 863   849   BUN 8 - 23 mg/dL 10   7   Creatinine 9.55 - 1.00 mg/dL 8.88   9.43   Sodium 864 - 145 mmol/L 133  135  134   Potassium 3.5 - 5.1 mmol/L 4.3  3.8  4.1   Chloride 98 - 111 mmol/L 95   99   CO2 22 - 32 mmol/L 29   27   Calcium  8.9 - 10.3 mg/dL 7.9   7.7     Overall she is doing well. Continue IS, ambulation.

## 2024-02-19 NOTE — Progress Notes (Signed)
 "  NAME:  [name], MRN:  [id number], DOB:  [birth year], LOS: 1 ADMISSION DATE:  02/18/2024, CONSULTATION DATE:  02/18/24  REFERRING MD:  Lucas  CHIEF COMPLAINT:  CAD  History of Present Illness:  73 year old female with a significant past medical history of COPD-current smoker, HTN, HLD, CAD s/p bilateral carotid endarterectomy, CAD s/p left circumflex stenting in 2010, PVD-recent vascular ultrasound showing >70% stenosis at the right CIA with claudication of right buttock/thigh, stenosis 50-74% of left SFA, prediabetes, polycythemia vera-care and treatment received by Dr. Timmy, and recent TIA-numbness on the left side of body who presents today for elective CABG x 2 by MD Bartle.  Patient had a recent cardiac catheterization on 01/03/2024 showing three-vessel CAD, EF 55 to 60% with trivial mitral regurg.    Total Pump Time: 67  X Clamp Time: 46 mins  EBL: 1409 Blood product: 1unit PRBCs, 1 unit of Plts  Cell Saver 775   Total Urine output- 1600 mls  Paralytic Reversed- no   Pertinent  Medical History   Past Medical History:  Diagnosis Date   Anxiety    Arthritis    CAD (coronary artery disease)    a. s/p LCX stent 2010 with residual disease.   Carotid artery disease    Cataract    CHF (congestive heart failure) (HCC)    COPD (chronic obstructive pulmonary disease) (HCC)    Depression    GERD (gastroesophageal reflux disease)    Hepatitis C    from blood transfusion in 70s per pt report- treated for this   History of carotid endarterectomy    Hyperlipidemia    borderline, I take a pill every other day   Hypertension    Noncompliance with medication regimen    Osteopenia    Polycythemia    Polycythemia vera (HCC)    Pre-diabetes    PVD (peripheral vascular disease)    Seizures (HCC) 1970   x1- pt. reports that there was never any causative agent     TIA (transient ischemic attack)    UC (ulcerative colitis) (HCC)      Significant Hospital Events: Including  procedures, antibiotic start and stop dates in addition to other pertinent events   12/29 CABG x2, PCCM consult  12/30 Required BIPAP overnight due to hypercarbia, tolerated well   Interim History / Subjective:  POD 1 CABG   On cleviprex   Utilized BIPAP overnight due to hypercarbia  No other complaints just post-op pain   Objective    Blood pressure 111/64, pulse 80, temperature 98.4 F (36.9 C), resp. rate 12, height 5' (1.524 m), weight 56.2 kg, SpO2 92%. PAP: (22-50)/(11-32) 30/15 CVP:  [3 mmHg-21 mmHg] 9 mmHg CO:  [2.7 L/min-3.2 L/min] 2.7 L/min CI:  [1.8 L/min/m2-2.1 L/min/m2] 1.8 L/min/m2  Vent Mode: PCV;BIPAP FiO2 (%):  [36 %-50 %] 36 % Set Rate:  [4 bmp-16 bmp] 14 bmp Vt Set:  [360 mL] 360 mL PEEP:  [5 cmH20-7 cmH20] 5 cmH20 Pressure Support:  [10 cmH20-15 cmH20] 13 cmH20   Intake/Output Summary (Last 24 hours) at 02/19/2024 0806 Last data filed at 02/19/2024 0700 Gross per 24 hour  Intake 4841.01 ml  Output 6084 ml  Net -1242.99 ml   Filed Weights   02/18/24 0602 02/19/24 0500  Weight: 53.1 kg 56.2 kg    Examination: General: acute on chronic older adult female lying in ICU bed, in NAD HEENT: Normocephalic, PERRLA intact, Pink MM, poor dentition  CV: s1,s2, paced-80, mediastinal and pleural chest tubes with minimal  output, epicardial wires in place, sternotomy dressing clean dry intact  pulm: clear, diminished, no distress, no wheezing on exam  Abs: bs active, soft  Extremities: no edema, moves extremities to command  Skin: no rash  Neuro: Rass 0, alert and oriented x 4  GU: foley intact- making urine   Labs pH 7.322, pCO2 52.7, pO2 83, bicarb 27.2  NA 134 K4.1 Glucose 150 Calcium  7.7  WBCs 10.7 Hemoglobin 15  Platelets 160   Resolved problem list   Assessment and Plan  CAD with triple vessel disease-CABG x2  LIMA graft to LAD  SVG to RCA  H/O CAD s/p bilateral carotid endarterectomy H/O CAD s/p left circumflex stenting in 2010 EF 55  to 60% with trivial mitral regurg -Initially coming out with labile BPs- having to utilize cleviprex /neo  12/30-patient only on low-dose insulin  and Cleviprex  P: Continue postop management per TCTS Continue chest tube management per TCTS, plan to remove chest tubes later today Continue pacing at rate of 80 per TCTS Remove Swan later today per TCTS Transition off insulin  infusion Continue to keep Foley for diuresis, remove Foley tomorrow on 12/31 Wean off Cleviprex -SBP goal 150-160, can add Norvasc  if having difficulty coming off infusion Continue PT/up to chair daily/pulmonary hygiene with IS Continue ASA and statin Holding metoprolol  for now due to having paced rhythm and junctional rhythm underneath Continue multimodal pain regimen-limit narcotics due to increased risk in CO2 retention Continue BiPAP as needed especially at night for now If postoperative pain becomes an issue would not increase narcotics, would add Robaxin to per prevent additional respiratory depression/insufficiency  Acute respiratory insufficiency, postop-extubated on 12/29 COPD-follows with MD Meade, started on Stiolto 12/03/23  Acquired utilization of BiPAP most of the night due to increased CO2 P: Continue Brovana  and yulperi for now - Will need to eventually transition to an inhaler, will give patient another day with nebulizations  Hypertension P: Currently on Cleviprex  infusion, SBP goal 150-160 If needing p.o. medications to help come off Cleviprex , then utilize Norvasc  Will receive diuresis today so should help with blood pressure  Hyponatremia-sodium 134 Concerns of worsening hyponatremia  P: Limit hypotonic fluids -Educate patient and not just drinking water  but alternating with juice Continue to trend electrolytes including sodium daily  Hyperlipidemia P: Continue statin  PVD recent vascular ultrasound showing >70% stenosis at the right CIA with claudication of right buttock/thigh, stenosis  50-74% of left SFA P: Continue to monitor neurovascular checks, currently no issues Will eventually need vascular workup  TIA hx  -left sided weakness hx but per anesthesia moves all extremities, no weakness on left  12/30-patient alert and orient x 4, no evidence of left-sided weakness on exam P: Continue to monitor neurostatus  Pre Diabetes  Patient hemoglobin A1c is 5.6 P: Transitioning off insulin  drip Continue CBGs Q4 Continue SSI as needed Continue CBG goal 140-180  Polycythemia Olena-  Follows with MD Enever, goal to keep Hematocrit <45%, last seen 11/14/23  Expected perioperative blood loss anemia-no current issue 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-no current issue - No issue currently with blood loss or thrombocytopenia Hemoglobin within normal limits, platelets also within normal limits P: Monitor H/H and PLT counts  Anxiety  P: Continue Lexapro  As needed Xanax  for now for anxiety   Labs   CBC: Recent Labs  Lab 02/12/24 1200 02/18/24 0828 02/18/24 1115 02/18/24 1119 02/18/24 1313 02/18/24 1347 02/18/24 1410 02/18/24 1726 02/18/24 1842 02/18/24 1844 02/18/24 2319 02/19/24 0433 02/19/24 0438  WBC 9.9  --   --   --  5.3  --  11.8*  --  12.8*  --   --  10.7*  --   HGB 14.3   < > 6.2*   < > 6.3*   < > 17.7*   < > 14.5 14.6 15.0 15.0 15.0  HCT 42.7   < > 18.0*   < > 18.8*   < > 49.9*   < > 42.4 43.0 44.0 42.3 44.0  MCV 94.3  --   --   --  94.5  --  88.8  --  90.0  --   --  89.6  --   PLT 276  --  108*  --  43*  --  127*  --  158  --   --  160  --    < > = values in this interval not displayed.    Basic Metabolic Panel: Recent Labs  Lab 02/12/24 1200 02/18/24 0828 02/18/24 1028 02/18/24 1052 02/18/24 1119 02/18/24 1159 02/18/24 1205 02/18/24 1347 02/18/24 1842 02/18/24 1844 02/18/24 2319 02/19/24 0433 02/19/24 0438  NA 133*   < > 131*   < > 127*   < > 129*   < > 136 137 136 134* 135  K 4.7   < > 3.6   < > 4.4   < > 5.0   < > 4.6 4.5 4.2 4.1 3.8   CL 92*   < > 94*  --  87*  --  91*  --  101  --   --  99  --   CO2 32  --   --   --   --   --   --   --  27  --   --  27  --   GLUCOSE 103*   < > 147*  --  99  --  125*  --  134*  --   --  150*  --   BUN 13   < > 11  --  9  --  9  --  8  --   --  7*  --   CREATININE 0.83   < > 0.60  --  0.40*  --  0.60  --  0.67  --   --  0.56  --   CALCIUM  10.0  --   --   --   --   --   --   --  7.7*  --   --  7.7*  --   MG  --   --   --   --   --   --   --   --   --   --   --  2.4  --   PHOS  --   --   --   --   --   --   --   --   --   --   --  3.7  --    < > = values in this interval not displayed.   GFR: Estimated Creatinine Clearance: 49.2 mL/min (by C-G formula based on SCr of 0.56 mg/dL). Recent Labs  Lab 02/18/24 1313 02/18/24 1410 02/18/24 1842 02/19/24 0433  WBC 5.3 11.8* 12.8* 10.7*    Liver Function Tests: Recent Labs  Lab 02/12/24 1200  AST 27  ALT 18  ALKPHOS 84  BILITOT 0.6  PROT 7.8  ALBUMIN  4.7   No results for input(s): LIPASE, AMYLASE in the last 168 hours. No results for input(s): AMMONIA in the last  168 hours.  ABG    Component Value Date/Time   PHART 7.322 (L) 02/19/2024 0438   PCO2ART 52.7 (H) 02/19/2024 0438   PO2ART 83 02/19/2024 0438   HCO3 27.2 02/19/2024 0438   TCO2 29 02/19/2024 0438   ACIDBASEDEF 2.0 02/18/2024 1844   O2SAT 95 02/19/2024 0438     Coagulation Profile: Recent Labs  Lab 02/12/24 1200 02/18/24 1313  INR 0.9 2.4*    Cardiac Enzymes: No results for input(s): CKTOTAL, CKMB, CKMBINDEX, TROPONINI in the last 168 hours.  HbA1C: Hgb A1c MFr Bld  Date/Time Value Ref Range Status  02/12/2024 12:00 PM 5.6 4.8 - 5.6 % Final    Comment:    (NOTE) Diagnosis of Diabetes The following HbA1c ranges recommended by the American Diabetes Association (ADA) may be used as an aid in the diagnosis of diabetes mellitus.  Hemoglobin             Suggested A1C NGSP%              Diagnosis  <5.7                   Non  Diabetic  5.7-6.4                Pre-Diabetic  >6.4                   Diabetic  <7.0                   Glycemic control for                       adults with diabetes.      CBG: Recent Labs  Lab 02/19/24 0018 02/19/24 0335 02/19/24 0432 02/19/24 0619 02/19/24 0724  GLUCAP 127* 113* 132* 161* 118*   Critical care time: 40 mins     Christian Antonio Creswell AGACNP-BC   [facility] 02/19/2024, 8:06 AM  Please see [email or web address] for pager details.  From 7A-7P if no response, please call [phone]. After hours, please call ELink [phone].          "

## 2024-02-19 NOTE — Progress Notes (Signed)
 1 Day Post-Op Procedures (LRB): CORONARY ARTERY BYPASS GRAFTING TIMES 2 WITH LEFT INTERNAL MAMMARY ARTERY AND ENDOSCOPICALLY HARVESTED LEFT SAPHENOUS VEIN (N/A) ECHOCARDIOGRAM, TRANSESOPHAGEAL, INTRAOPERATIVE (N/A) Subjective:  Sore chest but otherwise ok.  She was on bipap overnight for hypercarbia.  Atrial paced 80. Juncitional 45-50 under pacer.  Objective: Vital signs in last 24 hours: Temp:  [95.5 F (35.3 C)-98.8 F (37.1 C)] 98.6 F (37 C) (12/30 0645) Pulse Rate:  [61-82] 79 (12/30 0645) Cardiac Rhythm: Atrial paced (12/29 2000) Resp:  [10-24] 14 (12/30 0645) BP: (87-145)/(50-102) 109/53 (12/30 0645) SpO2:  [90 %-100 %] 94 % (12/30 0645) Arterial Line BP: (98-237)/(42-102) 128/44 (12/30 0645) FiO2 (%):  [40 %-50 %] 40 % (12/30 0336) Weight:  [56.2 kg] 56.2 kg (12/30 0500)  Hemodynamic parameters for last 24 hours: PAP: (22-50)/(11-32) 31/15 CVP:  [3 mmHg-21 mmHg] 9 mmHg CO:  [2.7 L/min-3.2 L/min] 2.7 L/min CI:  [1.8 L/min/m2-2.1 L/min/m2] 1.8 L/min/m2  Intake/Output from previous day: 12/29 0701 - 12/30 0700 In: 4694.8 [I.V.:1851.8; Blood:1395; IV Piggyback:1448.1] Out: 6084 [Urine:4325; Blood:1409; Chest Tube:350] Intake/Output this shift: No intake/output data recorded.  General appearance: alert and cooperative Neurologic: intact Heart: regular rate and rhythm Lungs: clear to auscultation bilaterally Extremities: edema mild Wound: dressings dry  Lab Results: Recent Labs    02/18/24 1842 02/18/24 1844 02/19/24 0433 02/19/24 0438  WBC 12.8*  --  10.7*  --   HGB 14.5   < > 15.0 15.0  HCT 42.4   < > 42.3 44.0  PLT 158  --  160  --    < > = values in this interval not displayed.   BMET:  Recent Labs    02/18/24 1842 02/18/24 1844 02/19/24 0433 02/19/24 0438  NA 136   < > 134* 135  K 4.6   < > 4.1 3.8  CL 101  --  99  --   CO2 27  --  27  --   GLUCOSE 134*  --  150*  --   BUN 8  --  7*  --   CREATININE 0.67  --  0.56  --   CALCIUM  7.7*  --   7.7*  --    < > = values in this interval not displayed.    PT/INR:  Recent Labs    02/18/24 1313  LABPROT 26.9*  INR 2.4*   ABG    Component Value Date/Time   PHART 7.322 (L) 02/19/2024 0438   HCO3 27.2 02/19/2024 0438   TCO2 29 02/19/2024 0438   ACIDBASEDEF 2.0 02/18/2024 1844   O2SAT 95 02/19/2024 0438   CBG (last 3)  Recent Labs    02/19/24 0335 02/19/24 0432 02/19/24 0619  GLUCAP 113* 132* 161*   CXR: ok  ECG: junctional 45. Read as AF but it is not.  Assessment/Plan: S/P Procedures (LRB): CORONARY ARTERY BYPASS GRAFTING TIMES 2 WITH LEFT INTERNAL MAMMARY ARTERY AND ENDOSCOPICALLY HARVESTED LEFT SAPHENOUS VEIN (N/A) ECHOCARDIOGRAM, TRANSESOPHAGEAL, INTRAOPERATIVE (N/A)  POD 1 Hemodynamically stable in junctional bradycardia rhythm. Continue AAI 80 and dc beta blocker.  Start diuresis and Kdur.  Glucose under good control with normal Hgb A1c of 5.6 preop on no meds. Transition insulin  drip to SSI.  DC femoral arterial line, swan.  Dangle and DC chest tubes.  IS, OOB, mobilize.   LOS: 1 day    [name] 02/19/2024

## 2024-02-20 ENCOUNTER — Inpatient Hospital Stay (HOSPITAL_COMMUNITY)

## 2024-02-20 DIAGNOSIS — R739 Hyperglycemia, unspecified: Secondary | ICD-10-CM | POA: Diagnosis not present

## 2024-02-20 DIAGNOSIS — D696 Thrombocytopenia, unspecified: Secondary | ICD-10-CM | POA: Diagnosis not present

## 2024-02-20 DIAGNOSIS — Z951 Presence of aortocoronary bypass graft: Secondary | ICD-10-CM | POA: Diagnosis not present

## 2024-02-20 DIAGNOSIS — E785 Hyperlipidemia, unspecified: Secondary | ICD-10-CM | POA: Diagnosis not present

## 2024-02-20 DIAGNOSIS — I10 Essential (primary) hypertension: Secondary | ICD-10-CM | POA: Diagnosis not present

## 2024-02-20 DIAGNOSIS — E871 Hypo-osmolality and hyponatremia: Secondary | ICD-10-CM | POA: Diagnosis not present

## 2024-02-20 DIAGNOSIS — Z87891 Personal history of nicotine dependence: Secondary | ICD-10-CM | POA: Diagnosis not present

## 2024-02-20 DIAGNOSIS — J432 Centrilobular emphysema: Secondary | ICD-10-CM | POA: Diagnosis not present

## 2024-02-20 DIAGNOSIS — J449 Chronic obstructive pulmonary disease, unspecified: Secondary | ICD-10-CM | POA: Diagnosis not present

## 2024-02-20 LAB — BASIC METABOLIC PANEL WITH GFR
Anion gap: 6 (ref 5–15)
BUN: 12 mg/dL (ref 8–23)
CO2: 29 mmol/L (ref 22–32)
Calcium: 7.8 mg/dL — ABNORMAL LOW (ref 8.9–10.3)
Chloride: 98 mmol/L (ref 98–111)
Creatinine, Ser: 0.96 mg/dL (ref 0.44–1.00)
GFR, Estimated: >60 mL/min
Glucose, Bld: 115 mg/dL — ABNORMAL HIGH (ref 70–99)
Potassium: 4.9 mmol/L (ref 3.5–5.1)
Sodium: 133 mmol/L — ABNORMAL LOW (ref 135–145)

## 2024-02-20 LAB — CBC
HCT: 36.8 % (ref 36.0–46.0)
Hemoglobin: 12.5 g/dL (ref 12.0–15.0)
MCH: 31.2 pg (ref 26.0–34.0)
MCHC: 34 g/dL (ref 30.0–36.0)
MCV: 91.8 fL (ref 80.0–100.0)
Platelets: 109 K/uL — ABNORMAL LOW (ref 150–400)
RBC: 4.01 MIL/uL (ref 3.87–5.11)
RDW: 13.9 % (ref 11.5–15.5)
WBC: 9.6 K/uL (ref 4.0–10.5)
nRBC: 0 % (ref 0.0–0.2)

## 2024-02-20 LAB — PHOSPHORUS: Phosphorus: 3.3 mg/dL (ref 2.5–4.6)

## 2024-02-20 LAB — MAGNESIUM: Magnesium: 2 mg/dL (ref 1.7–2.4)

## 2024-02-20 LAB — GLUCOSE, CAPILLARY: Glucose-Capillary: 95 mg/dL (ref 70–99)

## 2024-02-20 MED ORDER — FUROSEMIDE 10 MG/ML IJ SOLN
40.0000 mg | Freq: Two times a day (BID) | INTRAMUSCULAR | Status: AC
  Administered 2024-02-20 (×2): 40 mg via INTRAVENOUS
  Filled 2024-02-20 (×2): qty 4

## 2024-02-20 MED ORDER — GUAIFENESIN ER 600 MG PO TB12
600.0000 mg | ORAL_TABLET | Freq: Two times a day (BID) | ORAL | Status: DC
  Administered 2024-02-20 – 2024-02-23 (×7): 600 mg via ORAL
  Filled 2024-02-20 (×7): qty 1

## 2024-02-20 MED FILL — Potassium Chloride Inj 2 mEq/ML: INTRAVENOUS | Qty: 40 | Status: AC

## 2024-02-20 MED FILL — Magnesium Sulfate Inj 50%: INTRAMUSCULAR | Qty: 10 | Status: AC

## 2024-02-20 MED FILL — Heparin Sodium (Porcine) Inj 1000 Unit/ML: Qty: 1000 | Status: AC

## 2024-02-20 NOTE — Progress Notes (Signed)
 "  NAME:  [name], MRN:  [id number], DOB:  12/27/1950, LOS: 2 ADMISSION DATE:  02/18/2024, CONSULTATION DATE:  02/18/24  REFERRING MD:  Lucas  CHIEF COMPLAINT:  CAD  History of Present Illness:  73 year old female with a significant past medical history of COPD-current smoker, HTN, HLD, CAD s/p bilateral carotid endarterectomy, CAD s/p left circumflex stenting in 2010, PVD-recent vascular ultrasound showing >70% stenosis at the right CIA with claudication of right buttock/thigh, stenosis 50-74% of left SFA, prediabetes, polycythemia vera-care and treatment received by Dr. Timmy, and recent TIA-numbness on the left side of body who presents today for elective CABG x 2 by MD Bartle.  Patient had a recent cardiac catheterization on 01/03/2024 showing three-vessel CAD, EF 55 to 60% with trivial mitral regurg.    Total Pump Time: 67  X Clamp Time: 46 mins  EBL: 1409 Blood product: 1unit PRBCs, 1 unit of Plts  Cell Saver 775   Total Urine output- 1600 mls  Paralytic Reversed- no   Pertinent  Medical History   Past Medical History:  Diagnosis Date   Anxiety    Arthritis    CAD (coronary artery disease)    a. s/p LCX stent 2010 with residual disease.   Carotid artery disease    Cataract    CHF (congestive heart failure) (HCC)    COPD (chronic obstructive pulmonary disease) (HCC)    Depression    GERD (gastroesophageal reflux disease)    Hepatitis C    from blood transfusion in 70s per pt report- treated for this   History of carotid endarterectomy    Hyperlipidemia    borderline, I take a pill every other day   Hypertension    Noncompliance with medication regimen    Osteopenia    Polycythemia    Polycythemia vera (HCC)    Pre-diabetes    PVD (peripheral vascular disease)    Seizures (HCC) 1970   x1- pt. reports that there was never any causative agent     TIA (transient ischemic attack)    UC (ulcerative colitis) (HCC)      Significant Hospital Events: Including  procedures, antibiotic start and stop dates in addition to other pertinent events   12/29 CABG x2, PCCM consult. -Initially coming out with labile BPs- having to utilize cleviprex /neo  12/30-patient only on low-dose insulin  and Cleviprex  12/30 Required BIPAP overnight due to hypercarbia, tolerated well.Chest tubes out   Interim History / Subjective:  Cleviprex : off - more than 2.8 liters after lasix   Reports right sided CP w/ cough   Objective    Blood pressure 104/61, pulse 80, temperature 98.4 F (36.9 C), temperature source Oral, resp. rate 12, height 5' (1.524 m), weight 55.4 kg, SpO2 92%. PAP: (27-31)/(13-17) 28/15 CVP:  [8 mmHg-12 mmHg] 8 mmHg  FiO2 (%):  [36 %] 36 %   Intake/Output Summary (Last 24 hours) at 02/20/2024 0710 Last data filed at 02/20/2024 0600 Gross per 24 hour  Intake 718.15 ml  Output 2335 ml  Net -1616.85 ml   Filed Weights   02/18/24 0602 02/19/24 0500 02/20/24 0500  Weight: 53.1 kg 56.2 kg 55.4 kg    Examination: General 73 year old female. She is up in bed. No acute distress  HENT NCAT right internal jugular CVL dressing CD&I MMM Pulm decreased in left base. No wheezing. Currently on 3 lpm speaking in full sentences  PCXR decreased aeration left base. Looks like element of atx/effusion Card NSR no MRG the mid sternal dressing is CD&I.  Sternal wires intact dressing CD&I PM disconnected but on standby  Abd soft good intake this am  Gu cl yellow w/ FC to SD Neuro intact Ext warm dry no edema. Brisk CR warm  Resolved problem list   Assessment and Plan  CAD with triple vessel disease-CABG x2  w/ LIMA graft to LAD  SVG to RCA  H/O CAD s/p bilateral carotid endarterectomy H/O CAD s/p left circumflex stenting in 2010 EF 55 to 60% with trivial mitral regurg Not requiring pacer support. Currently NSR 70s Plan Continue postop management per TCTS Pacer off per CVTS but keep pacer wires in w/ pacer on stand-by  Mobilize Encourage IS Foley out today   Continue ASA and statin Add back plavix  once pacer wires out  Cont to hold  metoprolol  while requiring pace maker support Continue multimodal pain regimen-limit narcotics due to increased risk in CO2 retention     Acute respiratory insufficiency, postop-extubated on 12/29 COPD & tobacco abuse -follows with MD Meade, started on [facility] 12/03/23  required utilization of BiPAP most of the night due to increased CO2 -did not require NIPPV last evening  -aeration a little worse on am cxr 12/31 w/ element of effusion and atx. post CT removal 12/30. No bump in wbc; no fever spike plan Continue Brovana  and yulperi for now Can transition to formulary alternative to Stiolto respimat  today (can resume her home meds at dc)  Smoking cessation  IS/flutter Add mucinex    Hypertension BP control good overnight No longer on CCB gtt Plan Lasix  today  Monitor   Hyponatremia-sodium  Na 134-->133; she is neg > 2.8 liters; could got lasix  yesterday but still 5lbs up Plan Lasix  again today Cont to trend chems Am wt  Hyperlipidemia plan Continue statin  PVD recent vascular ultrasound showing >70% stenosis at the right CIA with claudication of right buttock/thigh, stenosis 50-74% of left SFA plan: Continue to monitor neurovascular checks, currently no issues Will eventually need vascular workup  TIA hx  -left sided weakness hx but per anesthesia moves all extremities, no weakness on left  12/30-patient alert and orient x 4, no evidence of left-sided weakness on exam plan Continue to monitor neurostatus  Pre Diabetes  Patient hemoglobin A1c is 5.6 plan Diet control    Polycythemia Olena-  Follows with MD Enever, goal to keep Hematocrit <45%, last seen 11/14/23  Expected perioperative blood loss anemia-no current issue 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  [address]231 on-going, stable,recovering or improving problem >25 min              "

## 2024-02-20 NOTE — Progress Notes (Signed)
 Patient resting comfortably on [name] with no respiratory distress noted.  Bipap on standby and not indicated at this time.  Will continue to monitor and assess for bipap needs.

## 2024-02-20 NOTE — Progress Notes (Signed)
 EVENING ROUNDS NOTE :     [address] [employer] [address]             [phone]               2 Days Post-Op Procedures (LRB): CORONARY ARTERY BYPASS GRAFTING TIMES 2 WITH LEFT INTERNAL MAMMARY ARTERY AND ENDOSCOPICALLY HARVESTED LEFT SAPHENOUS VEIN (N/A) ECHOCARDIOGRAM, TRANSESOPHAGEAL, INTRAOPERATIVE (N/A)   Total Length of Stay:  LOS: 2 days  Events:   No events Stable day    BP (!) 132/59 (BP Location: Left Arm)   Pulse 73   Temp 97.6 F (36.4 C) (Axillary)   Resp 16   Ht 5' (1.524 m)   Wt 55.4 kg   SpO2 95%   BMI 23.85 kg/m           I/O last 3 completed shifts: In: 1467.9 [P.O.:240; I.V.:522.2; IV Piggyback:705.7] Out: 3425 [Urine:3225; Chest Tube:200]      Latest Ref Rng & Units 02/20/2024    3:28 AM 02/19/2024    4:36 PM 02/19/2024    4:38 AM  CBC  WBC 4.0 - 10.5 K/uL 9.6  11.3    Hemoglobin 12.0 - 15.0 g/dL 87.4  85.7  84.9   Hematocrit 36.0 - 46.0 % 36.8  41.9  44.0   Platelets 150 - 400 K/uL 109  130         Latest Ref Rng & Units 02/20/2024    3:28 AM 02/19/2024    4:36 PM 02/19/2024    4:38 AM  BMP  Glucose 70 - 99 mg/dL 884  863    BUN 8 - 23 mg/dL 12  10    Creatinine 9.55 - 1.00 mg/dL 9.03  8.88    Sodium 864 - 145 mmol/L 133  133  135   Potassium 3.5 - 5.1 mmol/L 4.9  4.3  3.8   Chloride 98 - 111 mmol/L 98  95    CO2 22 - 32 mmol/L 29  29    Calcium  8.9 - 10.3 mg/dL 7.8  7.9      ABG    Component Value Date/Time   PHART 7.322 (L) 02/19/2024 0438   PCO2ART 52.7 (H) 02/19/2024 0438   PO2ART 83 02/19/2024 0438   HCO3 27.2 02/19/2024 0438   TCO2 29 02/19/2024 0438   ACIDBASEDEF 2.0 02/18/2024 1844   O2SAT 95 02/19/2024 0438       Linnie Rayas, MD 02/20/2024 5:10 PM

## 2024-02-20 NOTE — Progress Notes (Signed)
 2 Days Post-Op Procedures (LRB): CORONARY ARTERY BYPASS GRAFTING TIMES 2 WITH LEFT INTERNAL MAMMARY ARTERY AND ENDOSCOPICALLY HARVESTED LEFT SAPHENOUS VEIN (N/A) ECHOCARDIOGRAM, TRANSESOPHAGEAL, INTRAOPERATIVE (N/A) Subjective: Complains of right sided chest wall pain. Ambulated this am. Did not sleep much. Some thick sputum she is having trouble getting up.  Objective: Vital signs in last 24 hours: Temp:  [98.4 F (36.9 C)-99.3 F (37.4 C)] 98.4 F (36.9 C) (12/31 0308) Pulse Rate:  [67-87] 80 (12/31 0600) Cardiac Rhythm: Atrial paced (12/30 2000) Resp:  [10-27] 12 (12/31 0600) BP: (87-181)/(46-100) 104/61 (12/31 0600) SpO2:  [87 %-98 %] 92 % (12/31 0600) Arterial Line BP: (110-236)/(42-164) 236/164 (12/30 1200) FiO2 (%):  [36 %] 36 % (12/30 0754) Weight:  [55.4 kg] 55.4 kg (12/31 0500)  Hemodynamic parameters for last 24 hours: PAP: (27-31)/(13-17) 28/15 CVP:  [8 mmHg-12 mmHg] 8 mmHg  Intake/Output from previous day: 12/30 0701 - 12/31 0700 In: 718.2 [P.O.:240; I.V.:178.2; IV Piggyback:300] Out: 2335 [Urine:2335] Intake/Output this shift: No intake/output data recorded.  General appearance: alert and cooperative Neurologic: intact Heart: regular rate and rhythm Lungs: clear to auscultation bilaterally Extremities: edema mild Wound: Aquacel dressing intact and dry  Lab Results: Recent Labs    02/19/24 1636 02/20/24 0328  WBC 11.3* 9.6  HGB 14.2 12.5  HCT 41.9 36.8  PLT 130* 109*   BMET:  Recent Labs    02/19/24 1636 02/20/24 0328  NA 133* 133*  K 4.3 4.9  CL 95* 98  CO2 29 29  GLUCOSE 136* 115*  BUN 10 12  CREATININE 1.11* 0.96  CALCIUM  7.9* 7.8*    PT/INR:  Recent Labs    02/18/24 1313  LABPROT 26.9*  INR 2.4*   ABG    Component Value Date/Time   PHART 7.322 (L) 02/19/2024 0438   HCO3 27.2 02/19/2024 0438   TCO2 29 02/19/2024 0438   ACIDBASEDEF 2.0 02/18/2024 1844   O2SAT 95 02/19/2024 0438   CBG (last 3)  Recent Labs     02/19/24 1941 02/19/24 2331 02/20/24 0306  GLUCAP 111* 111* 95   CXR: mild left base atelectasis, may be a little effusion.  Assessment/Plan: S/P Procedures (LRB): CORONARY ARTERY BYPASS GRAFTING TIMES 2 WITH LEFT INTERNAL MAMMARY ARTERY AND ENDOSCOPICALLY HARVESTED LEFT SAPHENOUS VEIN (N/A) ECHOCARDIOGRAM, TRANSESOPHAGEAL, INTRAOPERATIVE (N/A)  POD 2 Hemodynamically stable in NSR 72 this am. Pacer turned off. Will keep pacing wires in today and observe off beta blocker.  -1617 cc yesterday. Wt down about 2 lbs. Still 5 lbs over preop. Will continue diuresis today.  Glucose under good control and normal Hgb A1c preop so will DC SSI.  Smoking and COPD: CCM is following. May need some mucolytic.  Continue IS, ambulation.  Plan to put back on Plavix  in addition to ASA once pacing wires out.   LOS: 2 days    [name] 02/20/2024

## 2024-02-21 DIAGNOSIS — D696 Thrombocytopenia, unspecified: Secondary | ICD-10-CM | POA: Diagnosis not present

## 2024-02-21 DIAGNOSIS — R739 Hyperglycemia, unspecified: Secondary | ICD-10-CM | POA: Diagnosis not present

## 2024-02-21 DIAGNOSIS — J449 Chronic obstructive pulmonary disease, unspecified: Secondary | ICD-10-CM | POA: Diagnosis not present

## 2024-02-21 DIAGNOSIS — Z87891 Personal history of nicotine dependence: Secondary | ICD-10-CM

## 2024-02-21 DIAGNOSIS — Z951 Presence of aortocoronary bypass graft: Secondary | ICD-10-CM | POA: Diagnosis not present

## 2024-02-21 LAB — BASIC METABOLIC PANEL WITH GFR
Anion gap: 6 (ref 5–15)
BUN: 18 mg/dL (ref 8–23)
CO2: 32 mmol/L (ref 22–32)
Calcium: 8.5 mg/dL — ABNORMAL LOW (ref 8.9–10.3)
Chloride: 95 mmol/L — ABNORMAL LOW (ref 98–111)
Creatinine, Ser: 0.88 mg/dL (ref 0.44–1.00)
GFR, Estimated: >60 mL/min
Glucose, Bld: 101 mg/dL — ABNORMAL HIGH (ref 70–99)
Potassium: 3.8 mmol/L (ref 3.5–5.1)
Sodium: 133 mmol/L — ABNORMAL LOW (ref 135–145)

## 2024-02-21 MED ORDER — POTASSIUM CHLORIDE CRYS ER 20 MEQ PO TBCR
20.0000 meq | EXTENDED_RELEASE_TABLET | Freq: Two times a day (BID) | ORAL | Status: AC
  Administered 2024-02-21 (×2): 20 meq via ORAL
  Filled 2024-02-21 (×2): qty 1

## 2024-02-21 MED ORDER — CLOPIDOGREL BISULFATE 75 MG PO TABS
75.0000 mg | ORAL_TABLET | Freq: Every day | ORAL | Status: DC
  Administered 2024-02-22 – 2024-02-23 (×2): 75 mg via ORAL
  Filled 2024-02-21 (×2): qty 1

## 2024-02-21 MED ORDER — ASPIRIN 81 MG PO TBEC
81.0000 mg | DELAYED_RELEASE_TABLET | Freq: Every day | ORAL | Status: DC
  Administered 2024-02-22 – 2024-02-23 (×2): 81 mg via ORAL
  Filled 2024-02-21 (×2): qty 1

## 2024-02-21 MED ORDER — SODIUM CHLORIDE 0.9% FLUSH: 3.0000 mL | INTRAVENOUS | Status: DC | PRN

## 2024-02-21 MED ORDER — UMECLIDINIUM-VILANTEROL 62.5-25 MCG/ACT IN AEPB
1.0000 | INHALATION_SPRAY | Freq: Every day | RESPIRATORY_TRACT | Status: DC
  Filled 2024-02-21: qty 14

## 2024-02-21 MED ORDER — SODIUM CHLORIDE 0.9 % IV SOLN: 250.0000 mL | INTRAVENOUS | Status: DC | PRN

## 2024-02-21 MED ORDER — DOCUSATE SODIUM 100 MG PO CAPS: 200.0000 mg | ORAL_CAPSULE | Freq: Every day | ORAL | Status: DC

## 2024-02-21 MED ORDER — METOPROLOL TARTRATE 12.5 MG HALF TABLET
12.5000 mg | ORAL_TABLET | Freq: Two times a day (BID) | ORAL | Status: DC
  Administered 2024-02-22 (×2): 12.5 mg via ORAL
  Filled 2024-02-21 (×4): qty 1

## 2024-02-21 MED ORDER — ~~LOC~~ CARDIAC SURGERY, PATIENT & FAMILY EDUCATION: Freq: Once | Status: AC

## 2024-02-21 MED ORDER — SODIUM CHLORIDE 0.9% FLUSH: 3.0000 mL | Freq: Two times a day (BID) | INTRAVENOUS | Status: DC

## 2024-02-21 NOTE — Progress Notes (Signed)
 "  NAME:  [name], MRN:  [id number], DOB:  [birth year], LOS: 3 ADMISSION DATE:  02/18/2024, CONSULTATION DATE:  02/18/24  REFERRING MD:  Lucas  CHIEF COMPLAINT:  CAD  History of Present Illness:  74 year old female with a significant past medical history of COPD-current smoker, HTN, HLD, CAD s/p bilateral carotid endarterectomy, CAD s/p left circumflex stenting in 2010, PVD-recent vascular ultrasound showing >70% stenosis at the right CIA with claudication of right buttock/thigh, stenosis 50-74% of left SFA, prediabetes, polycythemia vera-care and treatment received by Dr. Timmy, and recent TIA-numbness on the left side of body who presents today for elective CABG x 2 by MD Bartle.  Patient had a recent cardiac catheterization on 01/03/2024 showing three-vessel CAD, EF 55 to 60% with trivial mitral regurg.    Total Pump Time: 67  X Clamp Time: 46 mins  EBL: 1409 Blood product: 1unit PRBCs, 1 unit of Plts  Cell Saver 775   Total Urine output- 1600 mls  Paralytic Reversed- no   Pertinent  Medical History   Past Medical History:  Diagnosis Date   Anxiety    Arthritis    CAD (coronary artery disease)    a. s/p LCX stent 2010 with residual disease.   Carotid artery disease    Cataract    CHF (congestive heart failure) (HCC)    COPD (chronic obstructive pulmonary disease) (HCC)    Depression    GERD (gastroesophageal reflux disease)    Hepatitis C    from blood transfusion in 70s per pt report- treated for this   History of carotid endarterectomy    Hyperlipidemia    borderline, I take a pill every other day   Hypertension    Noncompliance with medication regimen    Osteopenia    Polycythemia    Polycythemia vera (HCC)    Pre-diabetes    PVD (peripheral vascular disease)    Seizures (HCC) 1970   x1- pt. reports that there was never any causative agent     TIA (transient ischemic attack)    UC (ulcerative colitis) (HCC)      Significant Hospital Events: Including  procedures, antibiotic start and stop dates in addition to other pertinent events   12/29 CABG x2, PCCM consult. -Initially coming out with labile BPs- having to utilize cleviprex /neo  12/30-patient only on low-dose insulin  and Cleviprex  12/30 Required BIPAP overnight due to hypercarbia, tolerated well.Chest tubes out   Interim History / Subjective:  Overnight no acute events. Appetite is improved but she does not like the food. She has been up walking.   Objective    Blood pressure 110/62, pulse 79, temperature 98.7 F (37.1 C), temperature source Oral, resp. rate 15, height 5' (1.524 m), weight 52.6 kg, SpO2 97%.        Intake/Output Summary (Last 24 hours) at 02/21/2024 0714 Last data filed at 02/21/2024 0544 Gross per 24 hour  Intake 530 ml  Output 2415 ml  Net -1885 ml   Filed Weights   02/19/24 0500 02/20/24 0500 02/21/24 0500  Weight: 56.2 kg 55.4 kg 52.6 kg    Examination: General elderly woman lying in bed in NAD  HENT [place]/AT, eyes anicteric Pulm breathing comfortably on RA, CTAB Card S1S2, RRR. Sternal incision without erythema or drainage, no bleeding.  Abd soft, NT  Neuro awake, alert, moving all extremities, answering questions appropriately. Ext no peripheral edema, no cyanosis   Na+ 133 K+ 3.6 BUN 18 Cr 0.88  Resolved problem list   Assessment and Plan  CAD with triple vessel disease-CABG x2 w/ LIMA graft to LAD, SVG to RCA. Previously LCX DES 2010  EF 55 to 60% with trivial mitral regurgitation  -post-op care per TCTS -progress diet and mobility; reinforced mobility restrictions -metoprolol  -aspirin , statin -pulling pacing wires today -post-op pain protocol- tramadol , oxy PRN  H/o PAD s/p bilateral carotid endarterectomy -aspirin , statin  COPD & tobacco abuse> reports she quit -can transition to Anoro; can resume PTA Stiolto at discharge -pulmonary hygiene -stressed the importance of tobacco avoidance  Hypertension -metoprolol  on hold until HR  improves  Hyponatremia -avoid hypotonic fluids -encouraged her to eat  Hyperlipidemia -statin  Polycythemia vera -goal Hematocrit <45%   DVT prophylaxis: lovenox   Stable to transfer to the floor. PCCM will be available as needed.  Leita SHAUNNA Gaskins, DO 02/21/2024 10:44 AM [facility]  For contact information, see Amion. If no response to pager, please call PCCM consult pager. After hours, 7PM- 7AM, please call Elink.  "

## 2024-02-21 NOTE — Progress Notes (Signed)
 3 Days Post-Op Procedures (LRB): CORONARY ARTERY BYPASS GRAFTING TIMES 2 WITH LEFT INTERNAL MAMMARY ARTERY AND ENDOSCOPICALLY HARVESTED LEFT SAPHENOUS VEIN (N/A) ECHOCARDIOGRAM, TRANSESOPHAGEAL, INTRAOPERATIVE (N/A) Subjective: No complaints this am. Slept well, ambulated.  Objective: Vital signs in last 24 hours: Temp:  [98.2 F (36.8 C)-98.7 F (37.1 C)] 98.2 F (36.8 C) (01/01 0733) Pulse Rate:  [62-84] 84 (01/01 0759) Cardiac Rhythm: Normal sinus rhythm (01/01 0353) Resp:  [10-22] 22 (01/01 0759) BP: (91-180)/(22-134) 180/134 (01/01 0700) SpO2:  [92 %-97 %] 95 % (01/01 0700) FiO2 (%):  [24 %] 24 % (01/01 0759) Weight:  [52.6 kg] 52.6 kg (01/01 0500)  Hemodynamic parameters for last 24 hours:    Intake/Output from previous day: 12/31 0701 - 01/01 0700 In: 530 [P.O.:530] Out: 2415 [Urine:2415] Intake/Output this shift: No intake/output data recorded.  General appearance: alert and cooperative Neurologic: intact Heart: regular rate and rhythm Lungs: rhonchi right lung Extremities: no edema Wound: dressing dry  Lab Results: Recent Labs    02/19/24 1636 02/20/24 0328  WBC 11.3* 9.6  HGB 14.2 12.5  HCT 41.9 36.8  PLT 130* 109*   BMET:  Recent Labs    02/20/24 0328 02/21/24 0337  NA 133* 133*  K 4.9 3.8  CL 98 95*  CO2 29 32  GLUCOSE 115* 101*  BUN 12 18  CREATININE 0.96 0.88  CALCIUM  7.8* 8.5*    PT/INR:  Recent Labs    02/18/24 1313  LABPROT 26.9*  INR 2.4*   ABG    Component Value Date/Time   PHART 7.322 (L) 02/19/2024 0438   HCO3 27.2 02/19/2024 0438   TCO2 29 02/19/2024 0438   ACIDBASEDEF 2.0 02/18/2024 1844   O2SAT 95 02/19/2024 0438   CBG (last 3)  Recent Labs    02/19/24 1941 02/19/24 2331 02/20/24 0306  GLUCAP 111* 111* 95    Assessment/Plan: S/P Procedures (LRB): CORONARY ARTERY BYPASS GRAFTING TIMES 2 WITH LEFT INTERNAL MAMMARY ARTERY AND ENDOSCOPICALLY HARVESTED LEFT SAPHENOUS VEIN (N/A) ECHOCARDIOGRAM, TRANSESOPHAGEAL,  INTRAOPERATIVE (N/A)  POD 3 Hemodynamically stable in NSR 70's. Will remove pacing wires today. Start low dose Lopressor .  Wt is below preop. No further diuresis needed.  Smoking and COPD. Continue pulmonary meds, IS, Flutter valve.  Transfer to 4E and continue mobilization.   LOS: 3 days    [name] 02/21/2024

## 2024-02-21 NOTE — TOC Initial Note (Signed)
 Transition of Care [facility]) - Initial/Assessment Note    Patient Details  Name: [name] MRN: [id number] Date of Birth: [birth year]  Transition of Care [facility]) CM/SW Contact:    Sudie Erminio Deems, RN Phone Number: 02/21/2024, 12:45 PM  Clinical Narrative: Patient POD-3 CABG. PTA patient was from home alone. Patient has family support of son and daughter-in-law. Family at the bedside during the visit. Patient has DME nebulizer in the home. Son states he will stay with the patient post hospitalization. [facility] is following the patient via surgical protocol for [facility] services. ICM will continue to follow for additional needs.                   Expected Discharge Plan: Home w Home Health Services Barriers to Discharge: Continued Medical Work up   Patient Goals and CMS Choice Patient states their goals for this hospitalization and ongoing recovery are:: Plans to return home with family support   Expected Discharge Plan and Services     Post Acute Care Choice: Home Health Living arrangements for the past 2 months: Single Family Home    Prior Living Arrangements/Services Living arrangements for the past 2 months: Single Family Home Lives with:: Self Patient language and need for interpreter reviewed:: Yes Do you feel safe going back to the place where you live?: Yes      Need for Family Participation in Patient Care: Yes (Comment) Care giver support system in place?: Yes (comment)   Criminal Activity/Legal Involvement Pertinent to Current Situation/Hospitalization: No - Comment as needed  Activities of Daily Living   ADL Screening (condition at time of admission) Independently performs ADLs?: Yes (appropriate for developmental age) Is the patient deaf or have difficulty hearing?: Yes Does the patient have difficulty seeing, even when wearing glasses/contacts?: No Does the patient have difficulty concentrating, remembering, or making decisions?: No  Permission  Sought/Granted Permission sought to share information with : Family Supports, Case Manager   Emotional Assessment Appearance:: Appears stated age Attitude/Demeanor/Rapport: Engaged Affect (typically observed): Appropriate Orientation: : Oriented to Self, Oriented to Place, Oriented to  Time, Oriented to Situation Alcohol / Substance Use: Not Applicable Psych Involvement: No (comment)  Admission diagnosis:  Coronary artery disease involving native coronary artery of native heart without angina pectoris [I25.10] S/P CABG x 2 [Z95.1] Patient Active Problem List   Diagnosis Date Noted   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   Claudication in peripheral vascular disease 11/21/2023   History of syncope 04/26/2020   Pre-op evaluation 04/26/2020   Monteggia's fracture of right ulna, init for clos fx 05/02/2018   Radius/ulna fracture, right, closed, initial encounter 04/26/2018   CAD S/P percutaneous coronary angioplasty 04/26/2018   Polycythemia vera (HCC) 04/26/2018   COPD (chronic obstructive pulmonary disease) (HCC) 04/26/2018   Hyperlipidemia LDL goal <70 10/30/2017   Colitis 03/21/2017   Colitis with rectal bleeding 03/21/2017   Polycythemia vera (HCC) 03/21/2017   Carotid artery stenosis 07/24/2016   Chest pain 03/09/2015   Tobacco abuse 03/09/2015   Influenza 03/02/2012   COPD exacerbation (HCC) 03/02/2012   Hypoxia 03/02/2012   Hyponatremia 03/02/2012   Hematochezia 10/30/2011   Coronary artery disease involving native coronary artery of native heart with angina pectoris    GERD (gastroesophageal reflux disease)    Polycythemia    Irritable bowel syndrome 08/15/2010   HERPES ZOSTER 10/07/2009   LIPOMA 10/07/2009   HERPETIC GINGIVOSTOMATITIS 01/06/2009   UNSPECIFIED VITAMIN D  DEFICIENCY 01/06/2009   ESOPHAGEAL REFLUX 06/04/2008   ANXIETY DEPRESSION 01/24/2007  OSTEOARTHRITIS, GENERALIZED, MULTIPLE JOINTS 11/21/2006   Acute hepatitis C virus infection 10/23/2006   Essential  hypertension 10/23/2006   PCP:  Ransom Other, MD Pharmacy:   [facility] [address], [place] - [address] [place] [address] Phone: [phone] Fax: [phone]  Social Drivers of Health (SDOH) Social History: SDOH Screenings   Food Insecurity: No Food Insecurity (02/20/2024)  Housing: Low Risk (02/20/2024)  Transportation Needs: No Transportation Needs (02/20/2024)  Utilities: Not At Risk (02/20/2024)  Depression (PHQ2-9): High Risk (11/14/2023)  Social Connections: Moderately Isolated (02/20/2024)  Tobacco Use: High Risk (02/18/2024)   Readmission Risk Interventions     No data to display

## 2024-02-21 NOTE — Progress Notes (Signed)
 0845 pacing wires were pulled while patient was flat in bed. Patient blood pressure remain stable and patient will remain in bed for 1h. Patient is resting comfortably in bed talking on phone. Omare Bilotta D Foch Rosenwald, RN

## 2024-02-21 NOTE — Progress Notes (Signed)
 Patient arrived at the unit from 2H,upon arrival pt is alert and oriented X4,chg bath given,vitals taken,CCMD notified ,pt oriented to the unit and call bell in reach

## 2024-02-22 ENCOUNTER — Inpatient Hospital Stay (HOSPITAL_COMMUNITY)

## 2024-02-22 LAB — TYPE AND SCREEN
ABO/RH(D): O POS
Antibody Screen: NEGATIVE
Unit division: 0
Unit division: 0
Unit division: 0
Unit division: 0
Unit division: 0
Unit division: 0

## 2024-02-22 LAB — BASIC METABOLIC PANEL WITH GFR
Anion gap: 9 (ref 5–15)
BUN: 13 mg/dL (ref 8–23)
CO2: 27 mmol/L (ref 22–32)
Calcium: 8.8 mg/dL — ABNORMAL LOW (ref 8.9–10.3)
Chloride: 96 mmol/L — ABNORMAL LOW (ref 98–111)
Creatinine, Ser: 0.73 mg/dL (ref 0.44–1.00)
GFR, Estimated: >60 mL/min
Glucose, Bld: 95 mg/dL (ref 70–99)
Potassium: 4.5 mmol/L (ref 3.5–5.1)
Sodium: 131 mmol/L — ABNORMAL LOW (ref 135–145)

## 2024-02-22 LAB — BPAM RBC
Blood Product Expiration Date: 202601262359
Blood Product Expiration Date: 202601262359
Blood Product Expiration Date: 202601272359
Blood Product Expiration Date: 202601272359
Blood Product Expiration Date: 202601272359
Blood Product Expiration Date: 202601272359
ISSUE DATE / TIME: 202512291120
ISSUE DATE / TIME: 202512291120
ISSUE DATE / TIME: 202512291148
ISSUE DATE / TIME: 202512291148
Unit Type and Rh: 5100
Unit Type and Rh: 5100
Unit Type and Rh: 5100
Unit Type and Rh: 5100
Unit Type and Rh: 5100
Unit Type and Rh: 5100

## 2024-02-22 NOTE — Progress Notes (Signed)
 CARDIAC REHAB PHASE I    No ambulatory efforts made with patient at this time due to intermittent new onset tachyarrhythmias. Post OHS education including site care, restrictions, heart healthy diabetic diet, sternal precautions, IS use at home, home needs at discharge, exercise guidelines, smoking cessation and CRP2 reviewed. All questions and concerns addressed. Will refer to [facility] for CRP2.    [id number] Isaiah JAYSON Liverpool, RN BSN 02/22/2024 12:27 PM

## 2024-02-22 NOTE — Progress Notes (Signed)
 Pt having very short bursts of afib/atrial tachycardia 120's to 170 lasting from 2 to 15 seconds each.  Patient  reports being unaware of rate/rhythm changes.  PA called and made aware.  New orders received.

## 2024-02-22 NOTE — Progress Notes (Addendum)
" ° °   °  [address] [place] [place] [address]             [phone]         4 Days Post-Op Procedures (LRB): CORONARY ARTERY BYPASS GRAFTING TIMES 2 WITH LEFT INTERNAL MAMMARY ARTERY AND ENDOSCOPICALLY HARVESTED LEFT SAPHENOUS VEIN (N/A) ECHOCARDIOGRAM, TRANSESOPHAGEAL, INTRAOPERATIVE (N/A)  Subjective:  Patient doing well.  Has some pain with coughing, but has been able to get sputum up without too much difficulty.  She is ambulating with a walker, which she has at home.  + BM  Objective: Vital signs in last 24 hours: Temp:  [98 F (36.7 C)-98.9 F (37.2 C)] 98.7 F (37.1 C) (01/02 0742) Pulse Rate:  [68-95] 95 (01/02 0742) Cardiac Rhythm: Normal sinus rhythm (01/01 1900) Resp:  [9-25] 17 (01/02 0742) BP: (84-143)/(54-114) 124/85 (01/02 0742) SpO2:  [89 %-96 %] 92 % (01/02 0742) FiO2 (%):  [24 %] 24 % (01/01 0759) Weight:  [52 kg] 52 kg (01/02 0500)  Intake/Output from previous day: 01/01 0701 - 01/02 0700 In: 600 [P.O.:600] Out: 450 [Urine:450]  General appearance: alert, cooperative, and no distress Heart: regular rate and rhythm Lungs: diminished breath sounds bibasilar Abdomen: soft, non-tender; bowel sounds normal; no masses,  no organomegaly Extremities: edema none appreciated Wound: clean and dry, extensive ecchymosis BLE  Lab Results: Recent Labs    02/19/24 1636 02/20/24 0328  WBC 11.3* 9.6  HGB 14.2 12.5  HCT 41.9 36.8  PLT 130* 109*   BMET:  Recent Labs    02/20/24 0328 02/21/24 0337  NA 133* 133*  K 4.9 3.8  CL 98 95*  CO2 29 32  GLUCOSE 115* 101*  BUN 12 18  CREATININE 0.96 0.88  CALCIUM  7.8* 8.5*    PT/INR: No results for input(s): LABPROT, INR in the last 72 hours. ABG    Component Value Date/Time   PHART 7.322 (L) 02/19/2024 0438   HCO3 27.2 02/19/2024 0438   TCO2 29 02/19/2024 0438   ACIDBASEDEF 2.0 02/18/2024 1844   O2SAT 95 02/19/2024 0438   CBG (last 3)  Recent Labs    02/19/24 1941  02/19/24 2331 02/20/24 0306  GLUCAP 111* 111* 95    Assessment/Plan: S/P Procedures (LRB): CORONARY ARTERY BYPASS GRAFTING TIMES 2 WITH LEFT INTERNAL MAMMARY ARTERY AND ENDOSCOPICALLY HARVESTED LEFT SAPHENOUS VEIN (N/A) ECHOCARDIOGRAM, TRANSESOPHAGEAL, INTRAOPERATIVE (N/A)  CV- NSR, BP is stable.. tolerating Lopressor  at 12.5 mg BID Pulm-COPD, off oxygen, + cough with sputum production.. on Mucinex .. has nebs at home. Chest xray 2 days ago showed pleural effusion, will get 2V this morning to assess and get baseline post surgery Renal- creatinine has been stable, weight is below baseline.. no indication for Lasix  at this time Deconditioning-mild has help at discharge ambulating without difficulty with walker Dispo- patient stable, tolerating BB, will get 2V CXR today to assess pleural effusions, establish post surgery baseline.. patient hoping to go home today will defer to Dr. Lucas, however I suspect will give her 1 more day in the hospital with discharge in AM if she remains stable   LOS: 4 days    Rocky Shad, PA-C 02/22/2024 7:56 AM   Chart reviewed, patient examined, agree with above.  She is doing well overall. Off oxygen, ambulating well. Rhythm stable sinus. Wt is below preop. Plan home tomorrow if no changes.  "

## 2024-02-23 ENCOUNTER — Other Ambulatory Visit (HOSPITAL_COMMUNITY): Payer: Self-pay

## 2024-02-23 ENCOUNTER — Encounter: Payer: Self-pay | Admitting: Hematology & Oncology

## 2024-02-23 MED ORDER — GUAIFENESIN ER 600 MG PO TB12: 600.0000 mg | ORAL_TABLET | Freq: Two times a day (BID) | ORAL | Status: AC

## 2024-02-23 MED ORDER — METOPROLOL TARTRATE 25 MG PO TABS
25.0000 mg | ORAL_TABLET | Freq: Two times a day (BID) | ORAL | 5 refills | Status: DC
  Filled 2024-02-23: qty 60, 30d supply, fill #0

## 2024-02-23 MED ORDER — METOPROLOL TARTRATE 25 MG PO TABS
25.0000 mg | ORAL_TABLET | Freq: Two times a day (BID) | ORAL | Status: DC
  Administered 2024-02-23: 25 mg via ORAL
  Filled 2024-02-23: qty 1

## 2024-02-23 MED ORDER — ASPIRIN 81 MG PO TBEC
81.0000 mg | DELAYED_RELEASE_TABLET | Freq: Every day | ORAL | 12 refills | Status: AC
  Filled 2024-02-23: qty 30, 30d supply, fill #0

## 2024-02-23 MED ORDER — TRAMADOL HCL 50 MG PO TABS
50.0000 mg | ORAL_TABLET | Freq: Four times a day (QID) | ORAL | 0 refills | Status: DC | PRN
  Filled 2024-02-23: qty 30, 8d supply, fill #0

## 2024-02-23 NOTE — Progress Notes (Addendum)
 "     [address] [place] [employer] [address]             [phone]         5 Days Post-Op Procedures (LRB): CORONARY ARTERY BYPASS GRAFTING TIMES 2 WITH LEFT INTERNAL MAMMARY ARTERY AND ENDOSCOPICALLY HARVESTED LEFT SAPHENOUS VEIN (N/A) ECHOCARDIOGRAM, TRANSESOPHAGEAL, INTRAOPERATIVE (N/A)  Subjective:  Awake and alert.  Said she was not allowed to walk much yesterday because she had a few episodes of brief tachyarrhythmias yesterday morning.  She has remained in stable sinus rhythm since then. No new concerns.  Objective: Vital signs in last 24 hours: Temp:  [98.1 F (36.7 C)-99.1 F (37.3 C)] 99 F (37.2 C) (01/03 0523) Pulse Rate:  [74-87] 78 (01/03 0523) Cardiac Rhythm: Normal sinus rhythm (01/03 0700) Resp:  [16-18] 16 (01/03 0523) BP: (107-155)/(53-68) 155/67 (01/03 0523) SpO2:  [92 %-95 %] 93 % (01/03 0523) Weight:  [53.1 kg] 53.1 kg (01/03 0523)  Intake/Output from previous day: 01/02 0701 - 01/03 0700 In: 340 [P.O.:340] Out: 1100 [Urine:1100]  General appearance: alert, cooperative, and no distress Heart: regular rate and rhythm, few brief episodes of atrial fibrillation yesterday morning, self terminated.  Otherwise, stable sinus rhythm Lungs: diminished breath sounds bibasilar Abdomen: soft, non-tender Extremities: No peripheral edema  Wounds: clean and dry, expected bruising left thigh  Lab Results: No results for input(s): WBC, HGB, HCT, PLT in the last 72 hours.  BMET:  Recent Labs    02/21/24 0337 02/22/24 1225  NA 133* 131*  K 3.8 4.5  CL 95* 96*  CO2 32 27  GLUCOSE 101* 95  BUN 18 13  CREATININE 0.88 0.73  CALCIUM  8.5* 8.8*    PT/INR: No results for input(s): LABPROT, INR in the last 72 hours. ABG    Component Value Date/Time   PHART 7.322 (L) 02/19/2024 0438   HCO3 27.2 02/19/2024 0438   TCO2 29 02/19/2024 0438   ACIDBASEDEF 2.0 02/18/2024 1844   O2SAT 95 02/19/2024 0438   CBG (last 3)  No results  for input(s): GLUCAP in the last 72 hours.   Assessment/Plan: S/P Procedures (LRB): CORONARY ARTERY BYPASS GRAFTING TIMES 2 WITH LEFT INTERNAL MAMMARY ARTERY AND ENDOSCOPICALLY HARVESTED LEFT SAPHENOUS VEIN (N/A) ECHOCARDIOGRAM, TRANSESOPHAGEAL, INTRAOPERATIVE (N/A)   - Postop day 5 CABG x 2 for multivessel coronary disease presenting with stable angina pectoris and preserved biventricular function.  Blood pressure trending up, will tolerate higher dose of metoprolol  so we will increase to 25 mg twice daily today.  Continue aspirin , Crestor , and Plavix .  -Neuro: Intact and pain well-controlled.  - Pulm: Tobacco smoker prior to admission, history of COPD.  Oxygenating well on room air and working on pulmonary hygiene with incentive spirometer and flutter valve.  - GI: Tolerating p.o.'s although not much appetite.  Having bowel movements.  Abdomen is benign  - Renal: Normal function, she is near preop weight.  No further diuresis planned.  -Dispo- patient stable.  Did not walk much yesterday because of the brief episodes of tachyarrhythmias she had earlier in the morning.  She has remained in stable sinus rhythm since then.  Will encourage her to walk in the hall this morning and observe rhythm.  If stable, plan to discharge home later today.   LOS: 5 days    Myron G. Roddenberry, PA-C 02/23/2024 8:26 AM Patient seen and examined, agree with above Just getting up to walk Home later today if no issues with ambulation  Elspeth MOTE Kerrin, MD [facility] [phone]     "

## 2024-02-23 NOTE — Progress Notes (Signed)
 Patient walked in the hallway around 500 ft  with assistance without any symptoms ,she remained in sinus rhythm ,pt left in bed with call bell in reach

## 2024-02-25 NOTE — Progress Notes (Signed)
 Patient discharged, Important Message Letter mailed to patient.

## 2024-02-25 NOTE — Care Management Important Message (Signed)
 Important Message  Patient Details  Name: [name] MRN: [id number] Date of Birth: [birth year]   Important Message Given:  No     [name] 02/25/2024, 9:42 AM

## 2024-02-25 NOTE — Progress Notes (Signed)
 [name]                                          MRN: [id number]   02/25/2024   The [facility]I Quality Team Specialist reviewed this patient medical record for the purposes of chart review for care gap closure. The following were reviewed: chart review for care gap closure-controlling blood pressure.    [facility]BCI Quality Team

## 2024-02-28 ENCOUNTER — Telehealth (HOSPITAL_COMMUNITY): Payer: Self-pay

## 2024-02-28 NOTE — Telephone Encounter (Signed)
 Attempted to call patient in regards to Cardiac Rehab - [name]

## 2024-02-29 ENCOUNTER — Emergency Department (HOSPITAL_COMMUNITY)
Admission: EM | Admit: 2024-02-29 | Discharge: 2024-02-29 | Discharge status: Against Medical Advice | Attending: Emergency Medicine | Admitting: Emergency Medicine

## 2024-02-29 ENCOUNTER — Other Ambulatory Visit: Payer: Self-pay

## 2024-02-29 ENCOUNTER — Telehealth: Payer: Self-pay

## 2024-02-29 ENCOUNTER — Encounter (HOSPITAL_COMMUNITY): Payer: Self-pay | Admitting: Emergency Medicine

## 2024-02-29 DIAGNOSIS — R079 Chest pain, unspecified: Secondary | ICD-10-CM | POA: Diagnosis present

## 2024-02-29 DIAGNOSIS — Z5321 Procedure and treatment not carried out due to patient leaving prior to being seen by health care provider: Secondary | ICD-10-CM | POA: Diagnosis not present

## 2024-02-29 DIAGNOSIS — Z951 Presence of aortocoronary bypass graft: Secondary | ICD-10-CM | POA: Diagnosis not present

## 2024-02-29 MED ORDER — SODIUM CHLORIDE 0.9 % IV BOLUS: 500.0000 mL | Freq: Once | INTRAVENOUS | Status: DC

## 2024-02-29 MED ORDER — OXYCODONE HCL 5 MG PO TABS
5.0000 mg | ORAL_TABLET | Freq: Once | ORAL | Status: AC
  Administered 2024-02-29: 5 mg via ORAL
  Filled 2024-02-29: qty 1

## 2024-02-29 NOTE — ED Notes (Signed)
 Attempting to establish IV at this time.  Pt continues to tell this RN where she will not be able to stick her for said IV.  RN continues to look for appropriate IV site, spt then states they are too little and I'm too dehydrated, you will not be able to find a vein.  IV and bloodwork will need to wait for pt o have a room and IV team to be called.  Pt consents to PO pain medications before placement in lobby.

## 2024-02-29 NOTE — ED Triage Notes (Signed)
 Pt to ER from home with c/o pain s/p CABG 2 weeks ago.  Pt was prescribed Tramadol  but did not take it because she did not like the way it made her feel.  Pt was changed to oxycodone  but is not taking that because it drys her out.  Pt c/o dehydration, states she just want something to hydrate me and then send me home.  When asked pt if she is drinking to stay hydrated pt yells at this RN I'm trying, just take me to another hospital.

## 2024-02-29 NOTE — Telephone Encounter (Signed)
 Patient's son, Arvella contacted the office requesting change in pain medication due to extreme dry mouth per patient. He states she does not want oxycodone . She has not taken Tramadol  since Tuesday besides one 1/2 dose last night. He also states that her BP has been running high but is unsure if the BP was taken before or after her taking her medication. Last  BPs were 171/101, 220/160. Upon further questioning, patient is not eating, drinking, walking, or using her incentive spirometer per son. Spoke with patient due to son feeling like she would take better direction directly, advised she needed to take steps to feeling better.  Patient very frustrated and states she cannot do anything. She seems short of breath on the phone. Advised that she needs to go to the ED for further evaluation. She agreed and acknowledged receipt.

## 2024-02-29 NOTE — ED Provider Triage Note (Signed)
 Emergency Medicine Provider Triage Evaluation Note  [name] , a 74 y.o. female  was evaluated in triage.  Pt complains of pain.  Pt had a CABG on 12/29.  She was d/c with tramadol  which she took once and caused her mouth to be dry.  She was given a rx for percocet which she did not want to take.  There is a note from her provider that she's not eating, drinking, walking or using her incentive spirometer.  She is a poor historian.  Review of Systems  Positive: cp Negative: fevers  Physical Exam  BP (!) 200/78 (BP Location: Left Arm)   Pulse 70   Temp 98.3 F (36.8 C) (Oral)   Resp 14   Ht 5' (1.524 m)   Wt 53 kg   SpO2 98%   BMI 22.82 kg/m  Gen:   Awake.  Complaining of pain. Resp:  Normal effort  MSK:   Moves extremities without difficulty  Other:    Medical Decision Making  Medically screening exam initiated at 2:05 PM.  Appropriate orders placed.  [name] was informed that the remainder of the evaluation will be completed by another provider, this initial triage assessment does not replace that evaluation, and the importance of remaining in the ED until their evaluation is complete.     Dean Clarity, MD 02/29/24 1408

## 2024-02-29 NOTE — ED Notes (Signed)
 PT refuse lab work

## 2024-02-29 NOTE — Telephone Encounter (Signed)
 Patient's son, Arvella contacted the office back stating patient left the ED before evaluation. Asked if patient could have home health services come to the home. [facility] was set to come to the home but before discharge patient was said to be independent and was discharged without. Spoke with Zebedee, RN with [facility] who placed orders for Nursing and PT evaluation in the home. Advised home health should possibly be at the home Sunday if not, then Monday. Made Brad, patient's son, aware and he acknowledged receipt.

## 2024-02-29 NOTE — ED Notes (Signed)
 Pt called for vitals x3, no response.

## 2024-02-29 NOTE — ED Notes (Signed)
 Pt refused x-ray at this time.

## 2024-03-01 ENCOUNTER — Emergency Department (HOSPITAL_BASED_OUTPATIENT_CLINIC_OR_DEPARTMENT_OTHER)

## 2024-03-01 ENCOUNTER — Emergency Department (HOSPITAL_BASED_OUTPATIENT_CLINIC_OR_DEPARTMENT_OTHER)
Admission: EM | Admit: 2024-03-01 | Discharge: 2024-03-01 | Disposition: A | Discharge status: Home / Self Care | Attending: Emergency Medicine | Admitting: Emergency Medicine

## 2024-03-01 ENCOUNTER — Other Ambulatory Visit: Payer: Self-pay | Admitting: Physician Assistant

## 2024-03-01 DIAGNOSIS — I2510 Atherosclerotic heart disease of native coronary artery without angina pectoris: Secondary | ICD-10-CM | POA: Diagnosis not present

## 2024-03-01 DIAGNOSIS — E876 Hypokalemia: Secondary | ICD-10-CM | POA: Diagnosis not present

## 2024-03-01 DIAGNOSIS — Z951 Presence of aortocoronary bypass graft: Secondary | ICD-10-CM | POA: Diagnosis not present

## 2024-03-01 DIAGNOSIS — E860 Dehydration: Secondary | ICD-10-CM | POA: Diagnosis present

## 2024-03-01 DIAGNOSIS — Z7982 Long term (current) use of aspirin: Secondary | ICD-10-CM | POA: Diagnosis not present

## 2024-03-01 LAB — COMPREHENSIVE METABOLIC PANEL WITH GFR
ALT: 21 U/L (ref 0–44)
AST: 30 U/L (ref 15–41)
Albumin: 4.1 g/dL (ref 3.5–5.0)
Alkaline Phosphatase: 83 U/L (ref 38–126)
Anion gap: 18 — ABNORMAL HIGH (ref 5–15)
BUN: 7 mg/dL — ABNORMAL LOW (ref 8–23)
CO2: 24 mmol/L (ref 22–32)
Calcium: 9.6 mg/dL (ref 8.9–10.3)
Chloride: 85 mmol/L — ABNORMAL LOW (ref 98–111)
Creatinine, Ser: 0.53 mg/dL (ref 0.44–1.00)
GFR, Estimated: >60 mL/min
Glucose, Bld: 112 mg/dL — ABNORMAL HIGH (ref 70–99)
Potassium: 3.3 mmol/L — ABNORMAL LOW (ref 3.5–5.1)
Sodium: 127 mmol/L — ABNORMAL LOW (ref 135–145)
Total Bilirubin: 1.1 mg/dL (ref 0.0–1.2)
Total Protein: 7.3 g/dL (ref 6.5–8.1)

## 2024-03-01 LAB — CBC WITH DIFFERENTIAL/PLATELET
Abs Immature Granulocytes: 0.02 K/uL (ref 0.00–0.07)
Basophils Absolute: 0 K/uL (ref 0.0–0.1)
Basophils Relative: 0 %
Eosinophils Absolute: 0.1 K/uL (ref 0.0–0.5)
Eosinophils Relative: 1 %
HCT: 40 % (ref 36.0–46.0)
Hemoglobin: 14.3 g/dL (ref 12.0–15.0)
Immature Granulocytes: 0 %
Lymphocytes Relative: 9 %
Lymphs Abs: 0.9 K/uL (ref 0.7–4.0)
MCH: 30.5 pg (ref 26.0–34.0)
MCHC: 35.8 g/dL (ref 30.0–36.0)
MCV: 85.3 fL (ref 80.0–100.0)
Monocytes Absolute: 0.6 K/uL (ref 0.1–1.0)
Monocytes Relative: 6 %
Neutro Abs: 8.2 K/uL — ABNORMAL HIGH (ref 1.7–7.7)
Neutrophils Relative %: 84 %
Platelets: 396 K/uL (ref 150–400)
RBC: 4.69 MIL/uL (ref 3.87–5.11)
RDW: 12.7 % (ref 11.5–15.5)
WBC: 9.9 K/uL (ref 4.0–10.5)
nRBC: 0 % (ref 0.0–0.2)

## 2024-03-01 LAB — URINALYSIS, ROUTINE W REFLEX MICROSCOPIC
Bilirubin Urine: NEGATIVE
Glucose, UA: NEGATIVE mg/dL
Hgb urine dipstick: NEGATIVE
Ketones, ur: 40 mg/dL — AB
Leukocytes,Ua: NEGATIVE
Nitrite: NEGATIVE
Specific Gravity, Urine: 1.008 (ref 1.005–1.030)
pH: 7 (ref 5.0–8.0)

## 2024-03-01 LAB — MAGNESIUM: Magnesium: 1.7 mg/dL (ref 1.7–2.4)

## 2024-03-01 MED ORDER — SODIUM CHLORIDE 0.9 % IV BOLUS
1000.0000 mL | Freq: Once | INTRAVENOUS | Status: AC
  Administered 2024-03-01: 1000 mL via INTRAVENOUS

## 2024-03-01 MED ORDER — MAGNESIUM OXIDE -MG SUPPLEMENT 400 (240 MG) MG PO TABS
800.0000 mg | ORAL_TABLET | Freq: Once | ORAL | Status: AC
  Administered 2024-03-01: 800 mg via ORAL
  Filled 2024-03-01: qty 2

## 2024-03-01 MED ORDER — ALPRAZOLAM 0.5 MG PO TABS
0.5000 mg | ORAL_TABLET | Freq: Once | ORAL | Status: AC
  Administered 2024-03-01: 0.5 mg via ORAL
  Filled 2024-03-01: qty 1

## 2024-03-01 MED ORDER — OXYCODONE-ACETAMINOPHEN 5-325 MG PO TABS
1.0000 | ORAL_TABLET | Freq: Once | ORAL | Status: AC
  Administered 2024-03-01: 1 via ORAL
  Filled 2024-03-01: qty 1

## 2024-03-01 MED ORDER — POTASSIUM CHLORIDE CRYS ER 20 MEQ PO TBCR
40.0000 meq | EXTENDED_RELEASE_TABLET | Freq: Once | ORAL | Status: AC
  Administered 2024-03-01: 40 meq via ORAL
  Filled 2024-03-01: qty 2

## 2024-03-01 MED ORDER — ONDANSETRON HCL 4 MG PO TABS: 4.0000 mg | ORAL_TABLET | Freq: Three times a day (TID) | ORAL | 0 refills | Status: AC | PRN

## 2024-03-01 MED ORDER — OXYCODONE HCL 5 MG PO TABS: 5.0000 mg | ORAL_TABLET | ORAL | 0 refills | Status: AC | PRN

## 2024-03-01 NOTE — ED Provider Notes (Signed)
 " [place] EMERGENCY DEPARTMENT AT [facility] Provider Note   CSN: [id number] Arrival date & time: 03/01/24  1159     Patient presents with: Dehydration   [name] is a 74 y.o. female.  With a history of CAD status post CABG (Dr. Lucas 12/29) COPD and anxiety who presents to the ED for concern of dehydration.  Patient is recovering from CABG at home feels as though she is not drinking enough voices concern for dehydration.  Has had persistent right sided chest discomfort since the CABG no new chest pain shortness of breath fevers chills vomiting diarrhea   HPI     Prior to Admission medications  Medication Sig Start Date End Date Taking? Authorizing Provider  acetaminophen  (TYLENOL ) 325 MG tablet Take 325-650 mg by mouth every 6 (six) hours as needed for mild pain or headache.    [provider]  albuterol  (VENTOLIN  HFA) 108 (90 Base) MCG/ACT inhaler Inhale 1-2 puffs into the lungs every 4 (four) hours as needed for wheezing or shortness of breath. 03/08/12   [provider]  ALPRAZolam  (XANAX ) 0.5 MG tablet Take 0.25-0.5 mg by mouth See admin instructions. Take 0.25 mg by mouth during the day as needed for anxiety and take 0.5 mg at bedtime 11/27/12   [provider]  ascorbic acid (VITAMIN C) 500 MG tablet Take 500 mg by mouth daily.    [provider]  aspirin  EC 81 MG tablet Take 1 tablet (81 mg total) by mouth daily. Swallow whole. 02/23/24   Roddenberry, Myron G, PA-C  clopidogrel  (PLAVIX ) 75 MG tablet Take 1 tablet by mouth once daily 08/06/23   Court Dorn PARAS, MD  clotrimazole -betamethasone  (LOTRISONE ) cream Apply 1 application  topically 2 (two) times daily as needed (irritation).    [provider]  escitalopram  (LEXAPRO ) 10 MG tablet Take 10 mg by mouth daily.    [provider]  guaiFENesin  (MUCINEX ) 600 MG 12 hr tablet Take 1 tablet (600 mg total) by mouth 2 (two) times daily. 02/23/24   Roddenberry, Myron G,  PA-C  hydrochlorothiazide  (HYDRODIURIL ) 25 MG tablet Take 12.5 mg by mouth daily. 10/13/22   [provider]  lansoprazole (PREVACID) 30 MG capsule Take 30 mg by mouth daily as needed (for ulcer/acid reflex).    [provider]  metoprolol  tartrate (LOPRESSOR ) 25 MG tablet Take 1 tablet (25 mg total) by mouth 2 (two) times daily. 02/23/24   Roddenberry, Myron G, PA-C  nitroGLYCERIN  (NITROSTAT ) 0.4 MG SL tablet Place 0.4 mg under the tongue every 5 (five) minutes as needed for chest pain.    [provider]  ondansetron  (ZOFRAN ) 4 MG tablet Take 1 tablet (4 mg total) by mouth every 8 (eight) hours as needed for nausea or vomiting. 03/01/24   Barrett, Erin R, PA-C  oxyCODONE  (OXY IR/ROXICODONE ) 5 MG immediate release tablet Take 1 tablet (5 mg total) by mouth every 4 (four) hours as needed for severe pain (pain score 7-10). 03/01/24   Barrett, Erin R, PA-C  rosuvastatin  (CRESTOR ) 10 MG tablet Take 1 tablet (10 mg total) by mouth daily. 11/21/23   Court Dorn PARAS, MD  Tiotropium Bromide-Olodaterol (STIOLTO RESPIMAT ) 2.5-2.5 MCG/ACT AERS Inhale 2 puffs into the lungs daily. Patient taking differently: Inhale 2 puffs into the lungs daily as needed (shortness of breath). 12/03/23   Desai, Nikita S, MD  Trolamine Salicylate (BLUE-EMU HEMP EX) Apply 1 Application topically daily as needed (joint pain).    [provider]  Allergies: Diphenhydramine, Sulfa  antibiotics, Sulfur, Amlodipine  besylate, Nsaids, and Sulfamethoxazole -trimethoprim     Review of Systems  Updated Vital Signs BP (!) 174/94   Pulse 65   Temp 97.6 F (36.4 C) (Oral)   Resp 18   SpO2 93%   Physical Exam Vitals and nursing note reviewed.  HENT:     Head: Normocephalic and atraumatic.  Eyes:     Pupils: Pupils are equal, round, and reactive to light.  Cardiovascular:     Rate and Rhythm: Normal rate and regular rhythm.     Comments: Healing surgical incision with sutures in place over sternum  and upper abdomen Pulmonary:     Effort: Pulmonary effort is normal.     Breath sounds: Normal breath sounds.  Abdominal:     Palpations: Abdomen is soft.     Tenderness: There is no abdominal tenderness.  Skin:    General: Skin is warm and dry.  Neurological:     Mental Status: She is alert.     Sensory: No sensory deficit.     Motor: No weakness.  Psychiatric:        Mood and Affect: Mood normal.     (all labs ordered are listed, but only abnormal results are displayed) Labs Reviewed  URINALYSIS, ROUTINE W REFLEX MICROSCOPIC - Abnormal; Notable for the following components:      Result Value   Ketones, ur 40 (*)    Protein, ur TRACE (*)    All other components within normal limits  COMPREHENSIVE METABOLIC PANEL WITH GFR - Abnormal; Notable for the following components:   Sodium 127 (*)    Potassium 3.3 (*)    Chloride 85 (*)    Glucose, Bld 112 (*)    BUN 7 (*)    Anion gap 18 (*)    All other components within normal limits  CBC WITH DIFFERENTIAL/PLATELET - Abnormal; Notable for the following components:   Neutro Abs 8.2 (*)    All other components within normal limits  MAGNESIUM     EKG: EKG Interpretation Date/Time:  Saturday March 01 2024 12:56:27 EST Ventricular Rate:  61 PR Interval:  176 QRS Duration:  85 QT Interval:  503 QTC Calculation: 507 R Axis:   93  Text Interpretation: Sinus rhythm Right axis deviation Prolonged QT interval Confirmed by Pamella Sharper [phone]) on 03/01/2024 2:46:39 PM  Radiology: DG Chest Portable 1 View Result Date: 03/01/2024 EXAM: 1 VIEW(S) XRAY OF THE CHEST 03/01/2024 01:33:22 PM COMPARISON: 02/22/2024 CLINICAL HISTORY: cp s/p cabg FINDINGS: LINES, TUBES AND DEVICES: Multiple wires and leads project over the chest on the frontal radiograph. LUNGS AND PLEURA: Lower lung predominant interstitial thickening is moderate and likely related to smoking. Interval resolution of bilateral pleural effusions. No pneumothorax. HEART AND  MEDIASTINUM: Intact median sternotomy wires. Prior CABG. Aortic calcification. BONES AND SOFT TISSUES: No acute osseous abnormality. IMPRESSION: 1. No acute findings. 2. Resolved bilateral pleural effusions. Electronically signed by: Rockey Kilts MD MD 03/01/2024 02:02 PM EST RP Workstation: [id number]     Procedures   Medications Ordered in the ED  magnesium  oxide (MAG-OX) tablet 800 mg (has no administration in time range)  potassium chloride  SA (KLOR-CON  M) CR tablet 40 mEq (has no administration in time range)  sodium chloride  0.9 % bolus 1,000 mL (1,000 mLs Intravenous New Bag/Given 03/01/24 1417)  oxyCODONE -acetaminophen  (PERCOCET/ROXICET) 5-325 MG per tablet 1 tablet (1 tablet Oral Given 03/01/24 1415)  ALPRAZolam  (XANAX ) tablet 0.5 mg (0.5 mg Oral Given 03/01/24 1500)  Clinical Course as of 03/01/24 1501  Sat Mar 01, 2024  1456 Laboratory workup notable for mild hypokalemia and mild hypomagnesemia.  Chest x-ray looks clear no UTI.  No leukocytosis.  EKG without dysrhythmia.  Patient feeling slightly better after fluids but is very anxious.  Requesting Xanax  which she takes at home.  Will provide 1 dose of Xanax  along with potassium magnesium  oral repletion.  No evidence of severe dehydration based on laboratory workup.  Will discharge with instruction for cardiology follow-up scheduled in 2 days [MP]    Clinical Course User Index [MP] Pamella Ozell LABOR, DO                                 Medical Decision Making 74 year old female with history as above presenting for concern of dehydration status post CABG 2 weeks ago.  Decreased p.o. intake at home since procedure.  Persistent right sided chest discomfort which is unchanged since the procedure.  No shortness of breath.  Will obtain chest x-ray to look for postoperative pneumonia or pulmonary edema labs to look for evidence of severe dehydration electrolyte imbalance EKG to look for any evidence of dysrhythmia.  IV fluids for  rehydration  Amount and/or Complexity of Data Reviewed Labs: ordered. Radiology: ordered.  Risk OTC drugs. Prescription drug management.        Final diagnoses:  Dehydration  Status post coronary artery bypass graft    ED Discharge Orders     None          Pamella Ozell LABOR, DO 03/01/24 1501  "

## 2024-03-01 NOTE — Discharge Instructions (Signed)
 You were seen in the emergency department given concern for dehydration after your surgery Your blood work looked okay but your potassium and magnesium  were slightly low We gave you potassium magnesium  here You need to follow-up with your cardiologist as scheduled for Monday to have your stitches out in for reevaluation Return to the Emergency Department for chest pain trouble breathing or other concerns

## 2024-03-01 NOTE — ED Triage Notes (Signed)
 Patient states concern for dehydration. Denies nausea or vomiting. States just not drinking enough. Post CABG 2 weeks ago.

## 2024-03-01 NOTE — Progress Notes (Signed)
" ° ° °  Ms. Flaming contacted the office in audible distress.  She states she is dry and needs IV fluids.  She states she is having pain, which she can't take Tramadol  for due to dry mouth.  She contacted our office yesterday and was instructed to go to the ED.  She did but refused to wait in the hallway.  She refused her labs to be drawn.  She was very upset that we can't just evaluate her in the ED.  She does not want to wait to be seen.  She was again advised that she needs to seek evaluation in the ED.  She however, did not want to do that.  She was instructed that she could try [facility] or [facility] which may have a shorter wait time.  She finally requested a prescription for nausea medication and oxycodone .  Rocky Shad, PA-C 11:38 AM 03/01/2024    "

## 2024-03-03 ENCOUNTER — Ambulatory Visit: Payer: Self-pay

## 2024-03-03 VITALS — BP 143/82 | HR 69 | Resp 18 | Ht 60.0 in | Wt 113.0 lb

## 2024-03-03 DIAGNOSIS — Z951 Presence of aortocoronary bypass graft: Secondary | ICD-10-CM

## 2024-03-03 NOTE — Progress Notes (Signed)
 "     [address] [place] [address]             [phone]       HPI:  Patient returns for routine postoperative follow-up having undergone Coronary artery bypass grafting x 2, Left internal mammary artery graft to the LAD,SVG to RCA, Endoscopic vein harvest from the left leg and insertion of left common femoral arterial line for BP monitoring on 02/18/2024 with Dr. Lucas.  The patient's early postoperative recovery while in the hospital was notable for having junctional bradycardia and was atrial paced.  She recovered to normal sinus rhythm and pacing wires were able to be removed. She was routinely diuresed. She was started on metoprolol . She was stable for discharge home on 02/23/2024.   Since hospital discharge the patient reports that she has been doing ok. She was seen in the emergency department on 03/01/2024 for dehydration.  She has been using oxycodone  and tylenol  as needed for pain.  She has right sided chest discomfort since surgery, which improves with prescribed medications. She has been trying to walk around her house for activity. She has a cough and was using mucinex . She discontinued using this medication due to having a dry mouth and not drinking enough fluids. She denies shortness of breath, chest pain and lower leg edema.      Allergies as of 03/03/2024       Reactions   Diphenhydramine Hives   Reaction was many years ago, I have taken one since for a bee sting and had no issues. Pt denies allergy to this anymore   Sulfa  Antibiotics Nausea And Vomiting   Sulfur Nausea Only   Amlodipine  Besylate Other (See Comments)   headaches   Nsaids Other (See Comments)   She doesn't take Nsaids because it runs up her BP.   Sulfamethoxazole -trimethoprim  Nausea And Vomiting        Medication List        Accurate as of March 03, 2024  3:54 PM. If you have any questions, ask your nurse or doctor.          acetaminophen  325 MG  tablet Commonly known as: TYLENOL  Take 325-650 mg by mouth every 6 (six) hours as needed for mild pain or headache.   ALPRAZolam  0.5 MG tablet Commonly known as: XANAX  Take 0.25-0.5 mg by mouth See admin instructions. Take 0.25 mg by mouth during the day as needed for anxiety and take 0.5 mg at bedtime   ascorbic acid 500 MG tablet Commonly known as: VITAMIN C Take 500 mg by mouth daily.   Aspirin  EC Adult Low Dose 81 MG tablet Generic drug: aspirin  EC Take 1 tablet (81 mg total) by mouth daily. Swallow whole.   BLUE-EMU HEMP EX Apply 1 Application topically daily as needed (joint pain).   clopidogrel  75 MG tablet Commonly known as: PLAVIX  Take 1 tablet by mouth once daily   clotrimazole -betamethasone  cream Commonly known as: LOTRISONE  Apply 1 application  topically 2 (two) times daily as needed (irritation).   escitalopram  10 MG tablet Commonly known as: LEXAPRO  Take 10 mg by mouth daily.   guaiFENesin  600 MG 12 hr tablet Commonly known as: MUCINEX  Take 1 tablet (600 mg total) by mouth 2 (two) times daily.   hydrochlorothiazide  25 MG tablet Commonly known as: HYDRODIURIL  Take 12.5 mg by mouth daily.   lansoprazole 30 MG capsule Commonly known as: PREVACID Take 30 mg by mouth daily as needed (  for ulcer/acid reflex).   metoprolol  tartrate 25 MG tablet Commonly known as: LOPRESSOR  Take 1 tablet (25 mg total) by mouth 2 (two) times daily.   nitroGLYCERIN  0.4 MG SL tablet Commonly known as: NITROSTAT  Place 0.4 mg under the tongue every 5 (five) minutes as needed for chest pain.   ondansetron  4 MG tablet Commonly known as: Zofran  Take 1 tablet (4 mg total) by mouth every 8 (eight) hours as needed for nausea or vomiting.   oxyCODONE  5 MG immediate release tablet Commonly known as: Oxy IR/ROXICODONE  Take 1 tablet (5 mg total) by mouth every 4 (four) hours as needed for severe pain (pain score 7-10).   rosuvastatin  10 MG tablet Commonly known as: CRESTOR  Take 1  tablet (10 mg total) by mouth daily.   Stiolto Respimat  2.5-2.5 MCG/ACT Aers Generic drug: Tiotropium Bromide-Olodaterol Inhale 2 puffs into the lungs daily. What changed:  when to take this reasons to take this   Ventolin  HFA 108 (90 Base) MCG/ACT inhaler Generic drug: albuterol  Inhale 1-2 puffs into the lungs every 4 (four) hours as needed for wheezing or shortness of breath.         ROS Review of Systems  Constitutional:  Positive for malaise/fatigue. Negative for chills and fever.  Respiratory:  Positive for cough. Negative for shortness of breath.   Cardiovascular:  Negative for chest pain, palpitations and leg swelling.      BP (!) 143/82 (BP Location: Left Arm)   Pulse 69   Resp 18   Ht 5' (1.524 m)   Wt 113 lb (51.3 kg)   SpO2 90%   BMI 22.07 kg/m    Physical Exam Constitutional:      Appearance: Normal appearance.  HENT:     Head: Normocephalic and atraumatic.  Cardiovascular:     Rate and Rhythm: Normal rate and regular rhythm.     Heart sounds: Normal heart sounds, S1 normal and S2 normal.  Pulmonary:     Effort: Pulmonary effort is normal.     Breath sounds: Normal breath sounds.  Musculoskeletal:     Cervical back: Normal range of motion.     Right lower leg: No edema.     Left lower leg: No edema.  Skin:    General: Skin is warm and dry.     Comments: Incision sites without erythema 3 sutures removed from chest tube/pacing wire sites  Neurological:     General: No focal deficit present.     Mental Status: She is alert.      Imaging: EXAM: 1 VIEW(S) XRAY OF THE CHEST 03/01/2024 01:33:22 PM   COMPARISON: 02/22/2024   CLINICAL HISTORY: cp s/p cabg   FINDINGS:   LINES, TUBES AND DEVICES: Multiple wires and leads project over the chest on the frontal radiograph.   LUNGS AND PLEURA: Lower lung predominant interstitial thickening is moderate and likely related to smoking. Interval resolution of bilateral pleural effusions.  No pneumothorax.   HEART AND MEDIASTINUM: Intact median sternotomy wires. Prior CABG. Aortic calcification.   BONES AND SOFT TISSUES: No acute osseous abnormality.   IMPRESSION: 1. No acute findings. 2. Resolved bilateral pleural effusions.   Electronically signed by: Rockey Kilts MD MD 03/01/2024 02:02 PM EST RP Workstation: [id number]   Assessment/Plan:  S/P CABG x 2 -We discussed her chest xray from the emergency department which showed resolution of bilateral pleural effusions -Discussed that she should continue sternal precautions until a full 6 weeks from surgery. She should not lift over 10 pounds  until a full 3 months from surgery -She should try to be active and discussed interval walking. She should try to do 3, 5 minutes walks per day and increase this as tolerated -Right sided chest pain is reproducible on exam.  She has not been wearing a bra and pain was diminished when she lifted/supported her breast.  Discussed that she should try to wear a supportive/comfortable bra. Pain may be due to her breast pulling on incision site -She should continue to use tylenol  and oxycodone  as prescribed for pain -She should continue to stay hydrated and drink plenty of fluids -She has follow up with cardiology on 03/12/2024  -Follow up in 2 weeks with chest xray with Dr. Lucas Manuelita CHRISTELLA Rutha, PA-C 3:54 PM 03/03/2024  "

## 2024-03-03 NOTE — Patient Instructions (Signed)
-  Follow up in 2 weeks with chest xray  You may continue to gradually increase your physical activity as tolerated.  Refrain from any heavy lifting or strenuous use of your arms and shoulders until at least 6 weeks from the time of your surgery, and avoid activities that cause increased pain in your chest on the side of your surgical incision.  Otherwise you may continue to increase activities without any particular limitations.  Increase the intensity and duration of physical activity gradually.

## 2024-03-06 ENCOUNTER — Telehealth: Payer: Self-pay

## 2024-03-06 NOTE — Telephone Encounter (Signed)
 Jerel /PT from [facility] called to report elevated BP's today with PT at home. 200/113 and 200/100. He states that she was asymptomatic / but c/o increased stress levels and not sleeping well. She denied going to the ED I spoke with Mr Iribe and she has been having a cough off and on. Denies any chest pain or shortness of breath./ Her incision sites look good. She was instructed to check her BP's several times per day and write them down. And she could try mucinex  for cough and benadryl for sleep . I will make the PA aware . She is scheduled to see Dr Court next week.

## 2024-03-07 ENCOUNTER — Telehealth: Payer: Self-pay | Admitting: Cardiovascular Disease

## 2024-03-07 NOTE — Telephone Encounter (Signed)
 Spoke with pt, she did increase the metoprolol  to 25 mg three times daily and her blood pressure this morning was 178/104. She was cautioned about her heart rate getting too low. She has a follow up with dr berry on 03/12/24. 164/82 by the nurse that saw her today. She will call the on-call line over the weekend with problems.

## 2024-03-07 NOTE — Telephone Encounter (Signed)
 Pt c/o BP issue: STAT if pt c/o blurred vision, one-sided weakness or slurred speech.  STAT if BP is GREATER than 180/120 TODAY.  STAT if BP is LESS than 90/60 and SYMPTOMATIC TODAY  1. What is your BP concern? Hypertension  2. Have you taken any BP medication today? Yes  3. What are your last 5 BP readings?178/100 AM before medication 164/82 PM  Yesterday 211/103 204/105 215/98   4. Are you having any other symptoms (ex. Dizziness, headache, blurred vision, passed out)? No

## 2024-03-10 NOTE — Telephone Encounter (Signed)
 Patient calling stating she is still experiencing hypertension

## 2024-03-10 NOTE — Telephone Encounter (Signed)
 Spoke with pt regarding her blood pressure. Pt stated she has been having high blood pressures since her heart surgery 3 weeks ago. Pt took her BP while on the phone and it was 218/92. Pt denied any symptoms but has been taking tylenol . Pt stated she is also having chest pain but believes it is due to the surgery. Pt was advised to go to the emergency room. Pt adamantly refused. Pt was told that Dr. Court would be notified and was again advised to go to the emergency room. Pt verbalized understanding. All questions if any were answered.

## 2024-03-11 ENCOUNTER — Other Ambulatory Visit: Payer: Self-pay | Admitting: Interventional Cardiology

## 2024-03-12 ENCOUNTER — Ambulatory Visit: Attending: Cardiovascular Disease | Admitting: Cardiovascular Disease

## 2024-03-12 ENCOUNTER — Encounter: Payer: Self-pay | Admitting: Cardiovascular Disease

## 2024-03-12 VITALS — BP 178/82 | HR 68 | Ht 61.0 in | Wt 112.0 lb

## 2024-03-12 DIAGNOSIS — I6523 Occlusion and stenosis of bilateral carotid arteries: Secondary | ICD-10-CM | POA: Diagnosis not present

## 2024-03-12 DIAGNOSIS — Z720 Tobacco use: Secondary | ICD-10-CM

## 2024-03-12 DIAGNOSIS — E785 Hyperlipidemia, unspecified: Secondary | ICD-10-CM | POA: Diagnosis not present

## 2024-03-12 DIAGNOSIS — I25119 Atherosclerotic heart disease of native coronary artery with unspecified angina pectoris: Secondary | ICD-10-CM | POA: Diagnosis not present

## 2024-03-12 DIAGNOSIS — I10 Essential (primary) hypertension: Secondary | ICD-10-CM

## 2024-03-12 DIAGNOSIS — I739 Peripheral vascular disease, unspecified: Secondary | ICD-10-CM | POA: Diagnosis not present

## 2024-03-12 MED ORDER — HYDROCHLOROTHIAZIDE 25 MG PO TABS: 12.5000 mg | ORAL_TABLET | Freq: Every day | ORAL | 3 refills | Status: AC

## 2024-03-12 MED ORDER — METOPROLOL SUCCINATE ER 25 MG PO TB24: 25.0000 mg | ORAL_TABLET | Freq: Every day | ORAL | 3 refills | Status: AC

## 2024-03-12 MED ORDER — OLMESARTAN MEDOXOMIL 20 MG PO TABS: 20.0000 mg | ORAL_TABLET | Freq: Every day | ORAL | 3 refills | Status: DC

## 2024-03-12 NOTE — Assessment & Plan Note (Signed)
Discontinued at the time of surgery

## 2024-03-12 NOTE — Assessment & Plan Note (Signed)
 History of bilateral carotid endarterectomy with carotid Dopplers performed pre-bypass that revealed widely patent endarterectomy sites.

## 2024-03-12 NOTE — Assessment & Plan Note (Signed)
 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  We will address this with angiography and potential endovascular therapy in the next several months.

## 2024-03-12 NOTE — Assessment & Plan Note (Signed)
 History of essential hypertension blood pressure measured today at 178/82.  Many of her medications that she was on pre-bypass have been discontinued.  I reviewed her blood pressure log which reveals persistently elevated systolic blood pressures.  She currently is only on metoprolol  tartrate twice daily.  I am going to consolidate this to succinate 75 mg a day, renew her HydroDIURIL  12.5 mg, and add olmesartan  20 mg a day.  Asked her to keep a 2-week blood pressure log after which she will see a Pharm.D. back in follow-up to make appropriate medication changes.

## 2024-03-12 NOTE — Telephone Encounter (Signed)
 Pt's concerns were addressed at office visit today (03/12/24).

## 2024-03-12 NOTE — Assessment & Plan Note (Signed)
 History of CAD status post left heart cath by Dr. Darron 01/03/2024 revealing three-vessel disease.  She had preserved LV function.  She ultimately underwent CABG x 2 by Dr. Lucas 02/18/2024 with a LIMA to her LAD and a vein to the RCA.  Her circumflex was not bypassed.  She currently denies chest pain.  Her median sternotomy scar is healing.  She has an appoint with Dr. Lucas next week.

## 2024-03-12 NOTE — Assessment & Plan Note (Signed)
 History of hyperlipidemia on statin therapy recently started several weeks ago with lipid profile performed 08/22/2023 revealing total cholesterol 215, LDL 122 and HDL of 63.  LDL goal less than 70.  Will recheck lipid profile in 3 months.

## 2024-03-12 NOTE — Patient Instructions (Signed)
 Medication Instructions:  Your physician has recommended you make the following change in your medication:   -Stop taking metoprolol  tartrate (lopressor )  -Start metoprolol  succinate (Toprol -XL) 75mg  once daily. (1/2 tablet of 100mg  and 25mg  tablet to make 75mg )  -Start olmesartan  (Benicar ) 20mg  once daily  *If you need a refill on your cardiac medications before your next appointment, please call your pharmacy*  Lab Work: Your physician recommends that you return for lab work in: 3 months for FASTING Lipid/liver panel  If you have labs (blood work) drawn today and your tests are completely normal, you will receive your results only by: MyChart Message (if you have MyChart) OR A paper copy in the mail If you have any lab test that is abnormal or we need to change your treatment, we will call you to review the results.   Follow-Up: At [facility], you and your health needs are our priority.  As part of our continuing mission to provide you with exceptional heart care, our providers are all part of one team.  This team includes your primary Cardiologist (physician) and Advanced Practice Providers or APPs (Physician Assistants and Nurse Practitioners) who all work together to provide you with the care you need, when you need it.  Your next appointment:   3 month(s)  Provider:   Dorn Lesches, MD    We recommend signing up for the patient portal called MyChart.  Sign up information is provided on this After Visit Summary.  MyChart is used to connect with patients for Virtual Visits (Telemedicine).  Patients are able to view lab/test results, encounter notes, upcoming appointments, etc.  Non-urgent messages can be sent to your provider as well.   To learn more about what you can do with MyChart, go to [email or web address].   Other Instructions Dr. Lesches has requested that you schedule an appointment with one of our clinical pharmacists for a blood pressure check  appointment within the next 4-6 weeks.  If you monitor your blood pressure (BP) at home, please bring your BP cuff and your BP readings with you to this appointment  HOW TO TAKE YOUR BLOOD PRESSURE: Rest 5 minutes before taking your blood pressure. Dont smoke or drink caffeinated beverages for at least 30 minutes before. Take your blood pressure before (not after) you eat. Sit comfortably with your back supported and both feet on the floor (dont cross your legs). Elevate your arm to heart level on a table or a desk. Use the proper sized cuff. It should fit smoothly and snugly around your bare upper arm. There should be enough room to slip a fingertip under the cuff. The bottom edge of the cuff should be 1 inch above the crease of the elbow. Ideally, take 3 measurements at one sitting and record the average.

## 2024-03-12 NOTE — Progress Notes (Signed)
 "     03/12/2024 [name]   05/27/1950  [id number]  Primary Physician Ransom Other, MD Primary Cardiologist: Dorn JINNY Lesches MD [place], [place]  HPI:  [name] is a 74 y.o.   thin-appearing divorced Caucasian female mother of 2, grandmother of 5 grandchildren who is retired from being an art therapist at [employer].  She was formally a patient of Dr. Esmeralda Sharps and then Dr. Dann.  I am assuming her care in their absence.  I last saw her in the office 12/27/2023.  She is accompanied by her granddaughter Jaclyn.    She has a history of ongoing tobacco abuse of 1 pack/day for the last 40 years off and on.  She has treated hypertension hyperlipidemia.  She is never had a heart attack or stroke.  She had coronary artery stenting in 2010, bilateral carotid endarterectomy in 2007 and 2018 followed by her PCP.  She also has P vera followed by hematologist Dr. Timmy.  She really is not very active.  She admits to being depressed and bays being dizzy occasionally.  1 week ago she had an episode which sounds like a TIA with numbness on the left side of her body.  She did check her blood pressure at the time it was 87/45.  Later that night her blood pressure had increased and her symptoms had resolved.   She has been complaining of occasional chest pain requiring sublingual nitroglycerin .  She also complains of bilateral upper thigh discomfort when ambulating.  Since I saw her a month ago I did get a Myoview  stress test 12/13/2023 that was intermediate risk consistent with ischemia in the circumflex marginal or posterolateral branch.  Lower extremities show Doppler studies suggest a high-grade right common iliac artery stenosis with a right ABI of 0.74.  I am going to proceed with outpatient diagnostic coronary angiography to determine her coronary anatomy and address that primarily after which we will focus on her lower extremities.  Since I saw her in the office 2  months ago I did refer her for outpatient left heart cath performed by Dr. Darron 01/03/2024 which revealed three-vessel disease.  CABG was recommended.  This was performed by Dr. Sherrine on 02/18/2024 with a LIMA to her LAD and a vein to the RCA.  She was discharged home on 02/23/2024.  She has been recuperating slowly.  Her blood pressures however have been under poor control at home.   Active Medications[1]   Allergies[2]  Social History   Socioeconomic History   Marital status: Single    Spouse name: Not on file   Number of children: 2   Years of education: Not on file   Highest education level: Not on file  Occupational History   Not on file  Tobacco Use   Smoking status: Some Days    Current packs/day: 0.50    Average packs/day: 1.5 packs/day for 58.1 years (86.0 ttl pk-yrs)    Types: Cigarettes, E-cigarettes    Start date: 1968    Last attempt to quit: 2025    Passive exposure: Never   Smokeless tobacco: Current   Tobacco comments:    Vape and occasional smoking     Uses nicotine  pouches  Vaping Use   Vaping status: Former   Start date: 08/07/2020   Substances: Nicotine   Substance and Sexual Activity   Alcohol use: Not Currently    Comment: socially occasionally   Drug use: Not Currently    Comment: has smoked  cigarettes off and on   Sexual activity: Not Currently  Other Topics Concern   Not on file  Social History Narrative   ** Merged History Encounter **       ** Merged History Encounter **       Tobacco use cigarettes: Current smoker Frequency 1 PPD Estimated Pack - years :30 Smoking : yes  No Tobacco exposure No alcohol No exercise Occupation : employed, works at home for [employer], Engineer, Maintenance (it)   n Boys 1 Girls 1   Social Drivers of Health   Tobacco Use: High Risk (03/12/2024)   Patient History    Smoking Tobacco Use: Some Days    Smokeless Tobacco Use: Current    Passive Exposure: Never  Financial  Resource Strain: Not on file  Food Insecurity: No Food Insecurity (02/20/2024)   Epic    Worried About Programme Researcher, Broadcasting/film/video in the Last Year: Never true    Ran Out of Food in the Last Year: Never true  Transportation Needs: No Transportation Needs (02/20/2024)   Epic    Lack of Transportation (Medical): No    Lack of Transportation (Non-Medical): No  Physical Activity: Not on file  Stress: Not on file  Social Connections: Moderately Isolated (02/20/2024)   Social Connection and Isolation Panel    Frequency of Communication with Friends and Family: More than three times a week    Frequency of Social Gatherings with Friends and Family: Three times a week    Attends Religious Services: More than 4 times per year    Active Member of Clubs or Organizations: No    Attends Banker Meetings: Never    Marital Status: Divorced  Catering Manager Violence: Not At Risk (02/20/2024)   Epic    Fear of Current or Ex-Partner: No    Emotionally Abused: No    Physically Abused: No    Sexually Abused: No  Depression (PHQ2-9): High Risk (11/14/2023)   Depression (PHQ2-9)    PHQ-2 Score: 13  Alcohol Screen: Not on file  Housing: Low Risk (02/20/2024)   Epic    Unable to Pay for Housing in the Last Year: No    Number of Times Moved in the Last Year: 0    Homeless in the Last Year: No  Utilities: Not At Risk (02/20/2024)   Epic    Threatened with loss of utilities: No  Health Literacy: Not on file     Review of Systems: General: negative for chills, fever, night sweats or weight changes.  Cardiovascular: negative for chest pain, dyspnea on exertion, edema, orthopnea, palpitations, paroxysmal nocturnal dyspnea or shortness of breath Dermatological: negative for rash Respiratory: negative for cough or wheezing Urologic: negative for hematuria Abdominal: negative for nausea, vomiting, diarrhea, bright red blood per rectum, melena, or hematemesis Neurologic: negative for visual changes,  syncope, or dizziness All other systems reviewed and are otherwise negative except as noted above.    Blood pressure (!) 178/82, pulse 68, height 5' 1 (1.549 m), weight 112 lb (50.8 kg).  General appearance: alert and no distress Neck: no adenopathy, no carotid bruit, no JVD, supple, symmetrical, trachea midline, and thyroid  not enlarged, symmetric, no tenderness/mass/nodules Lungs: clear to auscultation bilaterally Heart: regular rate and rhythm, S1, S2 normal, no murmur, click, rub or gallop Extremities: extremities normal, atraumatic, no cyanosis or edema Pulses: 2+ and symmetric Skin: Skin color, texture, turgor normal. No rashes or lesions Neurologic: Grossly normal  EKG not performed today  ASSESSMENT AND PLAN:   Hyperlipidemia LDL goal <70 History of hyperlipidemia on statin therapy recently started several weeks ago with lipid profile performed 08/22/2023 revealing total cholesterol 215, LDL 122 and HDL of 63.  LDL goal less than 70.  Will recheck lipid profile in 3 months.  Coronary artery disease involving native coronary artery of native heart with angina pectoris History of CAD status post left heart cath by Dr. Darron 01/03/2024 revealing three-vessel disease.  She had preserved LV function.  She ultimately underwent CABG x 2 by Dr. Lucas 02/18/2024 with a LIMA to her LAD and a vein to the RCA.  Her circumflex was not bypassed.  She currently denies chest pain.  Her median sternotomy scar is healing.  She has an appoint with Dr. Lucas next week.  Tobacco abuse Discontinued at the time of surgery  Claudication in peripheral vascular disease History of claudication with Dopplers performed 12/19/2023 revealing high-grade right common iliac artery stenosis.  We will address this with angiography and potential endovascular therapy in the next several months.  Carotid artery stenosis History of bilateral carotid endarterectomy with carotid Dopplers performed pre-bypass  that revealed widely patent endarterectomy sites.  Essential hypertension History of essential hypertension blood pressure measured today at 178/82.  Many of her medications that she was on pre-bypass have been discontinued.  I reviewed her blood pressure log which reveals persistently elevated systolic blood pressures.  She currently is only on metoprolol  tartrate twice daily.  I am going to consolidate this to succinate 75 mg a day, renew her HydroDIURIL  12.5 mg, and add olmesartan  20 mg a day.  Asked her to keep a 2-week blood pressure log after which she will see a Pharm.D. back in follow-up to make appropriate medication changes.     Dorn DOROTHA Lesches MD FACP,FACC,FAHA, FSCAI 03/12/2024 11:19 AM     [1]  Current Meds  Medication Sig   acetaminophen  (TYLENOL ) 325 MG tablet Take 325-650 mg by mouth every 6 (six) hours as needed for mild pain or headache.   albuterol  (VENTOLIN  HFA) 108 (90 Base) MCG/ACT inhaler Inhale 1-2 puffs into the lungs every 4 (four) hours as needed for wheezing or shortness of breath.   ALPRAZolam  (XANAX ) 0.5 MG tablet Take 0.25-0.5 mg by mouth See admin instructions. Take 0.25 mg by mouth during the day as needed for anxiety and take 0.5 mg at bedtime   ascorbic acid (VITAMIN C) 500 MG tablet Take 500 mg by mouth daily.   aspirin  EC 81 MG tablet Take 1 tablet (81 mg total) by mouth daily. Swallow whole.   clopidogrel  (PLAVIX ) 75 MG tablet Take 1 tablet by mouth once daily   clotrimazole -betamethasone  (LOTRISONE ) cream Apply 1 application  topically 2 (two) times daily as needed (irritation).   escitalopram  (LEXAPRO ) 10 MG tablet Take 10 mg by mouth daily.   hydrochlorothiazide  (HYDRODIURIL ) 25 MG tablet Take 12.5 mg by mouth daily.   lansoprazole (PREVACID) 30 MG capsule Take 30 mg by mouth daily as needed (for ulcer/acid reflex).   nitroGLYCERIN  (NITROSTAT ) 0.4 MG SL tablet Place 0.4 mg under the tongue every 5 (five) minutes as needed for chest pain.    ondansetron  (ZOFRAN ) 4 MG tablet Take 1 tablet (4 mg total) by mouth every 8 (eight) hours as needed for nausea or vomiting.   rosuvastatin  (CRESTOR ) 10 MG tablet Take 1 tablet (10 mg total) by mouth daily.   Tiotropium Bromide-Olodaterol (STIOLTO RESPIMAT ) 2.5-2.5 MCG/ACT AERS Inhale 2 puffs into the lungs daily. (Patient  taking differently: Inhale 2 puffs into the lungs daily as needed (shortness of breath).)   Trolamine Salicylate (BLUE-EMU HEMP EX) Apply 1 Application topically daily as needed (joint pain).   [DISCONTINUED] metoprolol  tartrate (LOPRESSOR ) 25 MG tablet Take 1 tablet (25 mg total) by mouth 2 (two) times daily.  [2]  Allergies Allergen Reactions   Diphenhydramine Hives    Reaction was many years ago, I have taken one since for a bee sting and had no issues. Pt denies allergy to this anymore   Sulfa  Antibiotics Nausea And Vomiting   Sulfur Nausea Only   Amlodipine  Besylate Other (See Comments)    headaches   Nsaids Other (See Comments)    She doesn't take Nsaids because it runs up her BP.   Sulfamethoxazole -Trimethoprim  Nausea And Vomiting   "

## 2024-03-18 ENCOUNTER — Telehealth: Payer: Self-pay | Admitting: Cardiovascular Disease

## 2024-03-18 ENCOUNTER — Other Ambulatory Visit: Payer: Self-pay | Admitting: Surgery

## 2024-03-18 DIAGNOSIS — I10 Essential (primary) hypertension: Secondary | ICD-10-CM

## 2024-03-18 DIAGNOSIS — Z951 Presence of aortocoronary bypass graft: Secondary | ICD-10-CM

## 2024-03-18 DIAGNOSIS — I25119 Atherosclerotic heart disease of native coronary artery with unspecified angina pectoris: Secondary | ICD-10-CM

## 2024-03-18 NOTE — Telephone Encounter (Signed)
 I spoke with patient.  She started Olmesartan  on 1/22 at 6 PM.  An hour later she developed stomach cramps, bloating and indigestion.  Had diarrhea between 3 and 4 AM.  Symptoms continued every evening.  Last night she did not take Olmesartan  and had no cramping, bloating, indigestion or diarrhea.  Last BP was 155/88 this AM prior to taking AM medications.  Patient reports she also cannot take Amlodipine  because it causes headaches.   Will forward to Dr Court for review/recommendations.

## 2024-03-18 NOTE — Telephone Encounter (Signed)
 I spoke with patient and gave her message from Dr Court.  I offered appointments tomorrow or 1/29 with pharmacist.  Patient states she does not want to see a pharmacist and requests Dr Court prescribe something over the phone for her BP.  Patient reports she previously took telmisartan  and has this medication at home.

## 2024-03-18 NOTE — Telephone Encounter (Signed)
 Pt c/o medication issue:  1. Name of Medication:   olmesartan  (BENICAR ) 20 MG tablet    2. How are you currently taking this medication (dosage and times per day)? As written  3. Are you having a reaction (difficulty breathing--STAT)? No  4. What is your medication issue? Pt states that medication is causing her to have cramps and diarrhea. She would like a c/b regarding this matter

## 2024-03-19 ENCOUNTER — Ambulatory Visit: Payer: Self-pay | Attending: Surgery | Admitting: Surgery

## 2024-03-19 ENCOUNTER — Ambulatory Visit (HOSPITAL_COMMUNITY)
Admission: RE | Admit: 2024-03-19 | Discharge: 2024-03-19 | Disposition: A | Discharge status: Home / Self Care | Source: Ambulatory Visit | Attending: Cardiovascular Disease | Admitting: Cardiovascular Disease

## 2024-03-19 ENCOUNTER — Ambulatory Visit

## 2024-03-19 VITALS — BP 140/75 | HR 75 | Resp 18 | Ht 60.0 in | Wt 115.0 lb

## 2024-03-19 DIAGNOSIS — Z951 Presence of aortocoronary bypass graft: Secondary | ICD-10-CM

## 2024-03-19 DIAGNOSIS — I25119 Atherosclerotic heart disease of native coronary artery with unspecified angina pectoris: Secondary | ICD-10-CM

## 2024-03-19 MED ORDER — TELMISARTAN 40 MG PO TABS: 40.0000 mg | ORAL_TABLET | Freq: Every day | ORAL | 3 refills | Status: AC

## 2024-03-19 NOTE — Telephone Encounter (Signed)
 Pt returning call

## 2024-03-19 NOTE — Telephone Encounter (Signed)
 Court Dorn PARAS, MD to [name], RN     03/19/24  8:08 AM That's fine with me. Can start her on 40 mg Telmisartan . BMET in 10 days   Left message for pt to call back to discuss, send prescription and order labs.

## 2024-03-19 NOTE — Telephone Encounter (Signed)
 Amber Dorn PARAS, MD to Amber Avelina CROME, RN     03/19/24  8:08 AM That's fine with me. Can start her on 40 mg Telmisartan . BMET in 10 days  Spoke with pt regarding changing her medications. Pt will discontinue olmesartan  and start telemisartan at 40mg  once daily. Pt states she has 80mg  tablets at home and is able to cut them in half to achieve dosage. New prescription sent for when pt needs refills. Advised pt that she will need to have labs drawn in 10-14 days. Lab orders placed. Pt will continue to monitor her blood pressure. Pt verbalizes understanding.

## 2024-03-22 ENCOUNTER — Encounter: Payer: Self-pay | Admitting: Surgery

## 2024-03-22 NOTE — Progress Notes (Signed)
 "  [address], [place] [address]             [phone]   HPI: Patient returns for routine postoperative follow-up having undergone coronary artery bypass graft surgery x 2 on 02/18/2024. The patient's early postoperative recovery while in the hospital was notable for an uncomplicated postoperative course. Since hospital discharge the patient reports that she is continuing to feel better over the past few weeks.  She is not taking any pain medicine.  She has continued to abstain from smoking.   Current Outpatient Medications  Medication Sig Dispense Refill   acetaminophen  (TYLENOL ) 325 MG tablet Take 325-650 mg by mouth every 6 (six) hours as needed for mild pain or headache.     albuterol  (VENTOLIN  HFA) 108 (90 Base) MCG/ACT inhaler Inhale 1-2 puffs into the lungs every 4 (four) hours as needed for wheezing or shortness of breath.     ALPRAZolam  (XANAX ) 0.5 MG tablet Take 0.25-0.5 mg by mouth See admin instructions. Take 0.25 mg by mouth during the day as needed for anxiety and take 0.5 mg at bedtime     ascorbic acid (VITAMIN C) 500 MG tablet Take 500 mg by mouth daily.     aspirin  EC 81 MG tablet Take 1 tablet (81 mg total) by mouth daily. Swallow whole. 30 tablet 12   clopidogrel  (PLAVIX ) 75 MG tablet Take 1 tablet by mouth once daily 90 tablet 3   clotrimazole -betamethasone  (LOTRISONE ) cream Apply 1 application  topically 2 (two) times daily as needed (irritation).     escitalopram  (LEXAPRO ) 10 MG tablet Take 10 mg by mouth daily.     lansoprazole (PREVACID) 30 MG capsule Take 30 mg by mouth daily as needed (for ulcer/acid reflex).     metoprolol  succinate (TOPROL  XL) 25 MG 24 hr tablet Take 1 tablet (25 mg total) by mouth daily. (Patient taking differently: Take 75 mg by mouth daily.) 90 tablet 3   nitroGLYCERIN  (NITROSTAT ) 0.4 MG SL tablet Place 0.4 mg under the tongue every 5 (five) minutes as needed for chest pain.     ondansetron  (ZOFRAN ) 4 MG tablet Take  1 tablet (4 mg total) by mouth every 8 (eight) hours as needed for nausea or vomiting. 20 tablet 0   rosuvastatin  (CRESTOR ) 10 MG tablet Take 1 tablet (10 mg total) by mouth daily. 90 tablet 3   Tiotropium Bromide-Olodaterol (STIOLTO RESPIMAT ) 2.5-2.5 MCG/ACT AERS Inhale 2 puffs into the lungs daily. (Patient taking differently: Inhale 2 puffs into the lungs daily as needed (shortness of breath).) 1 each 5   Trolamine Salicylate (BLUE-EMU HEMP EX) Apply 1 Application topically daily as needed (joint pain).     guaiFENesin  (MUCINEX ) 600 MG 12 hr tablet Take 1 tablet (600 mg total) by mouth 2 (two) times daily. (Patient not taking: Reported on 03/12/2024)     hydrochlorothiazide  (HYDRODIURIL ) 25 MG tablet Take 0.5 tablets (12.5 mg total) by mouth daily. 45 tablet 3   oxyCODONE  (OXY IR/ROXICODONE ) 5 MG immediate release tablet Take 1 tablet (5 mg total) by mouth every 4 (four) hours as needed for severe pain (pain score 7-10). (Patient not taking: Reported on 03/12/2024) 30 tablet 0   telmisartan  (MICARDIS ) 40 MG tablet Take 1 tablet (40 mg total) by mouth daily. 90 tablet 3   No current facility-administered medications for this visit.    Physical Exam: BP (!) 140/75   Pulse 75   Resp 18   Ht 5' (1.524  m)   Wt 115 lb (52.2 kg)   SpO2 94% Comment: RA  BMI 22.46 kg/m  She looks well. Cardiac exam shows regular rate and rhythm with normal heart sounds. Lungs are clear. Chest incision is healing well and sternum is stable. Her leg incision is healing well and there is no lower extremity edema.  Diagnostic Tests:  Narrative & Impression  CLINICAL DATA:  CABG.   EXAM: CHEST - 2 VIEW   COMPARISON:  Chest radiograph dated 03/01/2024.   FINDINGS: No focal consolidation, pleural effusion or pneumothorax. The cardiac silhouette is within normal limits. Median sternotomy wires and postsurgical changes of CABG. No acute osseous pathology.   IMPRESSION: No active cardiopulmonary disease.      Electronically Signed   By: Vanetta Chou M.D.   On: 03/19/2024 14:31      Impression:  She is doing well 1 month following her surgery.  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    Plan:  She will continue to follow-up with cardiology and will contact me if she develops any problems with her incisions.   Dorise MARLA Fellers, MD [facility] [phone]  "

## 2024-05-02 ENCOUNTER — Encounter (HOSPITAL_COMMUNITY)

## 2024-06-10 ENCOUNTER — Ambulatory Visit: Admitting: Cardiovascular Disease
